# Patient Record
Sex: Female | Born: 1945 | Race: White | Hispanic: No | Smoking: Never smoker
Health system: Southern US, Community
[De-identification: ages and names within clinical notes are randomized; demographics above are authoritative.]

## PROBLEM LIST (undated history)

## (undated) DIAGNOSIS — C55 Malignant neoplasm of uterus, part unspecified: Secondary | ICD-10-CM

## (undated) DIAGNOSIS — C787 Secondary malignant neoplasm of liver and intrahepatic bile duct: Secondary | ICD-10-CM

## (undated) DIAGNOSIS — Z923 Personal history of irradiation: Secondary | ICD-10-CM

## (undated) DIAGNOSIS — E039 Hypothyroidism, unspecified: Secondary | ICD-10-CM

## (undated) DIAGNOSIS — I10 Essential (primary) hypertension: Secondary | ICD-10-CM

## (undated) DIAGNOSIS — Z8489 Family history of other specified conditions: Secondary | ICD-10-CM

## (undated) DIAGNOSIS — S2249XA Multiple fractures of ribs, unspecified side, initial encounter for closed fracture: Secondary | ICD-10-CM

## (undated) DIAGNOSIS — S2239XA Fracture of one rib, unspecified side, initial encounter for closed fracture: Secondary | ICD-10-CM

## (undated) HISTORY — DX: Personal history of irradiation: Z92.3

## (undated) HISTORY — PX: TUBAL LIGATION: SHX77

## (undated) HISTORY — DX: Essential (primary) hypertension: I10

## (undated) HISTORY — PX: OTHER SURGICAL HISTORY: SHX169

## (undated) HISTORY — DX: Malignant neoplasm of uterus, part unspecified: C55

---

## 1998-09-21 ENCOUNTER — Other Ambulatory Visit: Admission: RE | Admit: 1998-09-21 | Discharge: 1998-09-21 | Payer: Self-pay | Admitting: *Deleted

## 1999-11-22 ENCOUNTER — Encounter: Admission: RE | Admit: 1999-11-22 | Discharge: 1999-11-22 | Payer: Self-pay | Admitting: *Deleted

## 2000-02-28 ENCOUNTER — Other Ambulatory Visit: Admission: RE | Admit: 2000-02-28 | Discharge: 2000-02-28 | Payer: Self-pay | Admitting: *Deleted

## 2000-11-25 ENCOUNTER — Encounter: Admission: RE | Admit: 2000-11-25 | Discharge: 2000-11-25 | Payer: Self-pay | Admitting: *Deleted

## 2000-11-28 ENCOUNTER — Encounter: Admission: RE | Admit: 2000-11-28 | Discharge: 2000-11-28 | Payer: Self-pay | Admitting: *Deleted

## 2001-03-03 ENCOUNTER — Other Ambulatory Visit: Admission: RE | Admit: 2001-03-03 | Discharge: 2001-03-03 | Payer: Self-pay | Admitting: *Deleted

## 2001-12-30 ENCOUNTER — Encounter: Admission: RE | Admit: 2001-12-30 | Discharge: 2001-12-30 | Payer: Self-pay | Admitting: *Deleted

## 2002-03-11 ENCOUNTER — Other Ambulatory Visit: Admission: RE | Admit: 2002-03-11 | Discharge: 2002-03-11 | Payer: Self-pay | Admitting: *Deleted

## 2002-05-14 ENCOUNTER — Encounter (INDEPENDENT_AMBULATORY_CARE_PROVIDER_SITE_OTHER): Payer: Self-pay | Admitting: *Deleted

## 2002-05-14 ENCOUNTER — Ambulatory Visit (HOSPITAL_COMMUNITY): Admission: RE | Admit: 2002-05-14 | Discharge: 2002-05-14 | Payer: Self-pay | Admitting: *Deleted

## 2003-04-05 ENCOUNTER — Encounter: Admission: RE | Admit: 2003-04-05 | Discharge: 2003-04-05 | Payer: Self-pay | Admitting: *Deleted

## 2003-04-05 ENCOUNTER — Other Ambulatory Visit: Admission: RE | Admit: 2003-04-05 | Discharge: 2003-04-05 | Payer: Self-pay | Admitting: *Deleted

## 2003-04-18 ENCOUNTER — Ambulatory Visit (HOSPITAL_COMMUNITY): Admission: RE | Admit: 2003-04-18 | Discharge: 2003-04-18 | Payer: Self-pay | Admitting: *Deleted

## 2004-04-27 ENCOUNTER — Encounter: Admission: RE | Admit: 2004-04-27 | Discharge: 2004-04-27 | Payer: Self-pay | Admitting: *Deleted

## 2004-05-18 ENCOUNTER — Ambulatory Visit (HOSPITAL_COMMUNITY): Admission: RE | Admit: 2004-05-18 | Discharge: 2004-05-18 | Payer: Self-pay | Admitting: *Deleted

## 2005-04-07 HISTORY — PX: ANKLE SURGERY: SHX546

## 2005-07-03 ENCOUNTER — Encounter: Admission: RE | Admit: 2005-07-03 | Discharge: 2005-08-01 | Payer: Self-pay | Admitting: Orthopaedic Surgery

## 2005-08-23 ENCOUNTER — Encounter: Admission: RE | Admit: 2005-08-23 | Discharge: 2005-08-23 | Payer: Self-pay | Admitting: *Deleted

## 2006-10-01 ENCOUNTER — Encounter: Admission: RE | Admit: 2006-10-01 | Discharge: 2006-10-01 | Payer: Self-pay | Admitting: Obstetrics and Gynecology

## 2007-10-28 ENCOUNTER — Encounter: Admission: RE | Admit: 2007-10-28 | Discharge: 2007-10-28 | Payer: Self-pay | Admitting: Obstetrics and Gynecology

## 2009-01-03 ENCOUNTER — Encounter: Admission: RE | Admit: 2009-01-03 | Discharge: 2009-01-03 | Payer: Self-pay | Admitting: Obstetrics and Gynecology

## 2009-08-30 ENCOUNTER — Encounter (INDEPENDENT_AMBULATORY_CARE_PROVIDER_SITE_OTHER): Payer: Self-pay | Admitting: *Deleted

## 2010-04-13 ENCOUNTER — Telehealth: Payer: Self-pay | Admitting: Internal Medicine

## 2010-07-18 DIAGNOSIS — K602 Anal fissure, unspecified: Secondary | ICD-10-CM | POA: Insufficient documentation

## 2010-07-18 DIAGNOSIS — K649 Unspecified hemorrhoids: Secondary | ICD-10-CM | POA: Insufficient documentation

## 2010-07-29 ENCOUNTER — Encounter: Payer: Self-pay | Admitting: Family Medicine

## 2010-08-07 NOTE — Letter (Signed)
Summary: Colonoscopy Letter  [facility]  [address] [place], [place] [address]   Phone: [phone]  Fax: [phone]      August 30, 2009 MRN: [id number]   Michelle Rosario [address] [place], [place]  [address]   Dear [name],   According to your medical record, it is time for you to schedule a Colonoscopy. The American Cancer Society recommends this procedure as a method to detect early colon cancer. Patients with a family history of colon cancer, or a personal history of colon polyps or inflammatory bowel disease are at increased risk.  This letter has beeen generated based on the recommendations made at the time of your procedure. If you feel that in your particular situation this may no longer apply, please contact our office.  Please call our office at [phone] to schedule this appointment or to update your records at your earliest convenience.  Thank you for cooperating with [place] to provide you with the very best care possible.   Sincerely,  Hedwig Morton. Juanda Chance, M.D.  [facility] [phone]

## 2010-08-07 NOTE — Progress Notes (Signed)
Summary: Schedule Colonoscopy  Phone Note Outgoing Call Call back at [facility] Phone [phone]   Call placed by: Harlow Mares CMA Duncan Dull),  April 13, 2010 10:09 AM Call placed to: Patient Summary of Call: Left a message on patients machine to call back. patient due for her colonoscopy Initial call taken by: Harlow Mares CMA Duncan Dull),  April 13, 2010 10:09 AM  Follow-up for Phone Call        Left a message on the patient machine to call back and schedule a previsit and procedure with our office. A letter will be mailed to the patient.  Follow-up by: Harlow Mares CMA Duncan Dull),  April 20, 2010 3:53 PM     Appended Document: Schedule Colonoscopy pt getting GI care with Dr. Madilyn Fireman

## 2010-11-23 NOTE — Op Note (Signed)
NAME:  Michelle Rosario, Michelle Rosario                          ACCOUNT NO.:  [id number]   MEDICAL RECORD NO.:  [id number]                   PATIENT TYPE:  AMB   LOCATION:  DAY                                  FACILITY:  [facility]   PHYSICIAN:  Pershing Cox, M.D.            DATE OF BIRTH:  Sep 30, 1945   DATE OF PROCEDURE:  05/14/2002  DATE OF DISCHARGE:                                 OPERATIVE REPORT   PREOPERATIVE DIAGNOSES:  1. Postmenopausal bleeding.  2. Abnormal sonogram.   POSTOPERATIVE DIAGNOSES:  1. Postmenopausal bleeding.  2. Abnormal sonogram.  3. Irregular posterior uterine wall.   PROCEDURE:  1. Exam under anesthesia.  2. Fractional D&C hysteroscopy.   SURGEON:  Pershing Cox, M.D.   ANESTHESIA:  MAC plus Marcaine paracervical block.   INDICATIONS FOR PROCEDURE:  Michelle Rosario is a 65 year old female, who has  been on hormone replacement therapy with Vivelle and Prometrium daily.  She  started having cyclic bleeding starting in March.  She has had no other  significant problems.  Because of his postmenopausal bleeding, I recommended  that she have a sonogram.  This was performed and showed a thickened  endometrium.  She subsequently underwent a hydrosonogram, and this  hydrosonogram showed a thickened posterior wall.  For this reason, she is  brought to the operating room today for [facility] hysteroscopy.  Prior to this  procedure, she had an endometrial biopsy which showed proliferative  endometrium with breakdown.   FINDINGS:  The endometrial cavity was 3.75 inches in length.  The cavity was  in a slightly retroverted position.  Cavity walls were easily distended.  There were no filling defects.  There was thickened tissue on the posterior  uterine wall, especially along the lower uterine segment.   DESCRIPTION OF PROCEDURE:  Michelle Rosario was brought to the operating room  with an IV in place.  She received 1 g of Ancef in the holding area.  Supine  on the OR  table, IV sedation was administered, and she was placed into Allen  stirrups.  Hibiclens was used to prep the vagina and perineum.  A red rubber  catheter was used to empty the bladder.  Examination showed the uterus to be  in the mid plane to slightly anteverted position.  The patient was then  draped for a sterile vaginal procedure.  Collecting drape was placed beneath  the buttocks to collect her effluent so it could be adequately measured.   Bivalve speculum was inserted into the vagina.  Marcaine was used to  anesthetize the anterior cervix which was grasped with a single-tooth  tenaculum.  Marcaine 25% was injected into the cervix in the 3, 4, 7, and 8  positions using a total volume of 20 cc of this solution.  Using a Kevorkian  curette, endocervical curettings were obtained, collected onto a Telfa.  The  sound then passed to a  depth of 3.75 inches in a slightly retroverted  position.  Cervix was then dilated using serial Pratt dilators to size 25.  Diagnostic scope was introduced and using through-and-through sorbitol  irrigation, I was able to visualize the cavity.  Photographs were taken.  The scope was then removed.  Small sharp curette was used to collect  endometrial curettings onto a waiting Telfa.  The serrated or Meigs curette  was used to finally curette the endometrial cavity.  All of these curettings  were submitted as endometrial curettings.  Sound was re-placed and passed to  a depth of 3.75 inches.  The patient was cleansed of the Hibiclens and then  taken to the recovery room in good condition.                                               Pershing Cox, M.D.    MAJ/MEDQ  D:  05/14/2002  T:  05/14/2002  Job:  [id number]   cc:   Dr. Andrey Campanile

## 2011-02-25 ENCOUNTER — Other Ambulatory Visit: Payer: Self-pay | Admitting: Obstetrics

## 2011-02-25 DIAGNOSIS — Z1231 Encounter for screening mammogram for malignant neoplasm of breast: Secondary | ICD-10-CM

## 2011-03-21 ENCOUNTER — Ambulatory Visit
Admission: RE | Admit: 2011-03-21 | Discharge: 2011-03-21 | Disposition: A | Discharge status: Home / Self Care | Payer: Federal, State, Local not specified - PPO | Source: Ambulatory Visit | Attending: Obstetrics | Admitting: Obstetrics

## 2011-03-21 DIAGNOSIS — Z1231 Encounter for screening mammogram for malignant neoplasm of breast: Secondary | ICD-10-CM

## 2011-10-07 ENCOUNTER — Encounter: Payer: Self-pay | Admitting: Internal Medicine

## 2011-10-23 ENCOUNTER — Ambulatory Visit: Payer: Federal, State, Local not specified - PPO | Admitting: Physical Therapy

## 2012-02-25 ENCOUNTER — Ambulatory Visit: Payer: Federal, State, Local not specified - PPO | Attending: Orthopedic Surgery | Admitting: Physical Therapy

## 2012-02-25 DIAGNOSIS — M545 Low back pain, unspecified: Secondary | ICD-10-CM | POA: Insufficient documentation

## 2012-02-25 DIAGNOSIS — R5381 Other malaise: Secondary | ICD-10-CM | POA: Insufficient documentation

## 2012-02-25 DIAGNOSIS — IMO0001 Reserved for inherently not codable concepts without codable children: Secondary | ICD-10-CM | POA: Insufficient documentation

## 2012-02-26 ENCOUNTER — Ambulatory Visit: Payer: Federal, State, Local not specified - PPO | Admitting: Physical Therapy

## 2012-02-28 ENCOUNTER — Ambulatory Visit: Payer: Federal, State, Local not specified - PPO | Admitting: *Deleted

## 2012-03-02 ENCOUNTER — Ambulatory Visit: Payer: Federal, State, Local not specified - PPO | Admitting: Physical Therapy

## 2012-03-05 ENCOUNTER — Ambulatory Visit: Payer: Federal, State, Local not specified - PPO | Admitting: *Deleted

## 2012-03-11 ENCOUNTER — Ambulatory Visit: Payer: Federal, State, Local not specified - PPO | Attending: Orthopedic Surgery | Admitting: Physical Therapy

## 2012-03-11 DIAGNOSIS — R5381 Other malaise: Secondary | ICD-10-CM | POA: Insufficient documentation

## 2012-03-11 DIAGNOSIS — IMO0001 Reserved for inherently not codable concepts without codable children: Secondary | ICD-10-CM | POA: Insufficient documentation

## 2012-03-11 DIAGNOSIS — M545 Low back pain, unspecified: Secondary | ICD-10-CM | POA: Insufficient documentation

## 2012-03-13 ENCOUNTER — Ambulatory Visit: Payer: Federal, State, Local not specified - PPO | Admitting: Physical Therapy

## 2012-03-16 ENCOUNTER — Ambulatory Visit: Payer: Federal, State, Local not specified - PPO | Admitting: Physical Therapy

## 2012-05-05 ENCOUNTER — Other Ambulatory Visit: Payer: Self-pay | Admitting: Obstetrics

## 2012-05-05 DIAGNOSIS — Z1231 Encounter for screening mammogram for malignant neoplasm of breast: Secondary | ICD-10-CM

## 2012-06-19 ENCOUNTER — Ambulatory Visit
Admission: RE | Admit: 2012-06-19 | Discharge: 2012-06-19 | Disposition: A | Discharge status: Home / Self Care | Payer: Federal, State, Local not specified - PPO | Source: Ambulatory Visit | Attending: Obstetrics | Admitting: Obstetrics

## 2012-06-19 DIAGNOSIS — Z1231 Encounter for screening mammogram for malignant neoplasm of breast: Secondary | ICD-10-CM

## 2012-06-24 ENCOUNTER — Other Ambulatory Visit: Payer: Self-pay | Admitting: Obstetrics

## 2012-06-24 DIAGNOSIS — R928 Other abnormal and inconclusive findings on diagnostic imaging of breast: Secondary | ICD-10-CM

## 2012-07-06 ENCOUNTER — Ambulatory Visit
Admission: RE | Admit: 2012-07-06 | Discharge: 2012-07-06 | Disposition: A | Discharge status: Home / Self Care | Payer: Federal, State, Local not specified - PPO | Source: Ambulatory Visit | Attending: Obstetrics | Admitting: Obstetrics

## 2012-07-06 DIAGNOSIS — R928 Other abnormal and inconclusive findings on diagnostic imaging of breast: Secondary | ICD-10-CM

## 2012-07-08 HISTORY — PX: OTHER SURGICAL HISTORY: SHX169

## 2012-08-17 ENCOUNTER — Ambulatory Visit: Payer: Federal, State, Local not specified - PPO | Attending: Orthopedic Surgery | Admitting: Physical Therapy

## 2012-08-17 DIAGNOSIS — IMO0001 Reserved for inherently not codable concepts without codable children: Secondary | ICD-10-CM | POA: Insufficient documentation

## 2012-08-17 DIAGNOSIS — R5381 Other malaise: Secondary | ICD-10-CM | POA: Insufficient documentation

## 2012-08-17 DIAGNOSIS — M545 Low back pain, unspecified: Secondary | ICD-10-CM | POA: Insufficient documentation

## 2012-08-20 ENCOUNTER — Encounter: Payer: Federal, State, Local not specified - PPO | Admitting: Physical Therapy

## 2012-08-26 ENCOUNTER — Encounter: Payer: Federal, State, Local not specified - PPO | Admitting: Physical Therapy

## 2012-09-02 ENCOUNTER — Ambulatory Visit: Payer: Federal, State, Local not specified - PPO | Admitting: Physical Therapy

## 2012-09-08 ENCOUNTER — Ambulatory Visit: Payer: Federal, State, Local not specified - PPO | Attending: Orthopedic Surgery | Admitting: Physical Therapy

## 2012-09-08 DIAGNOSIS — IMO0001 Reserved for inherently not codable concepts without codable children: Secondary | ICD-10-CM | POA: Insufficient documentation

## 2012-09-08 DIAGNOSIS — M545 Low back pain, unspecified: Secondary | ICD-10-CM | POA: Insufficient documentation

## 2012-09-08 DIAGNOSIS — R5381 Other malaise: Secondary | ICD-10-CM | POA: Insufficient documentation

## 2012-09-11 ENCOUNTER — Encounter: Payer: Federal, State, Local not specified - PPO | Admitting: *Deleted

## 2012-10-14 ENCOUNTER — Ambulatory Visit: Payer: Federal, State, Local not specified - PPO | Attending: Orthopedic Surgery | Admitting: Physical Therapy

## 2012-10-14 DIAGNOSIS — R5381 Other malaise: Secondary | ICD-10-CM | POA: Insufficient documentation

## 2012-10-14 DIAGNOSIS — M545 Low back pain, unspecified: Secondary | ICD-10-CM | POA: Insufficient documentation

## 2012-10-14 DIAGNOSIS — IMO0001 Reserved for inherently not codable concepts without codable children: Secondary | ICD-10-CM | POA: Insufficient documentation

## 2012-10-16 ENCOUNTER — Ambulatory Visit: Payer: Federal, State, Local not specified - PPO | Admitting: Physical Therapy

## 2013-07-08 HISTORY — PX: REPLACEMENT TOTAL KNEE: SUR1224

## 2013-07-09 ENCOUNTER — Other Ambulatory Visit: Payer: Self-pay

## 2013-07-09 DIAGNOSIS — Z1231 Encounter for screening mammogram for malignant neoplasm of breast: Secondary | ICD-10-CM

## 2013-07-29 ENCOUNTER — Ambulatory Visit
Admission: RE | Admit: 2013-07-29 | Discharge: 2013-07-29 | Disposition: A | Discharge status: Home / Self Care | Payer: Federal, State, Local not specified - PPO | Source: Ambulatory Visit

## 2013-07-29 DIAGNOSIS — Z1231 Encounter for screening mammogram for malignant neoplasm of breast: Secondary | ICD-10-CM

## 2013-08-09 DIAGNOSIS — M1712 Unilateral primary osteoarthritis, left knee: Secondary | ICD-10-CM | POA: Insufficient documentation

## 2014-02-03 DIAGNOSIS — L290 Pruritus ani: Secondary | ICD-10-CM | POA: Insufficient documentation

## 2014-11-01 ENCOUNTER — Other Ambulatory Visit: Payer: Self-pay | Admitting: Obstetrics

## 2014-11-01 DIAGNOSIS — E2839 Other primary ovarian failure: Secondary | ICD-10-CM

## 2014-11-10 ENCOUNTER — Ambulatory Visit
Admission: RE | Admit: 2014-11-10 | Discharge: 2014-11-10 | Disposition: A | Discharge status: Home / Self Care | Payer: Medicare Other | Source: Ambulatory Visit | Attending: Obstetrics | Admitting: Obstetrics

## 2014-11-10 DIAGNOSIS — E2839 Other primary ovarian failure: Secondary | ICD-10-CM

## 2015-02-14 ENCOUNTER — Other Ambulatory Visit: Payer: Self-pay | Admitting: Gastroenterology

## 2015-12-18 ENCOUNTER — Ambulatory Visit: Payer: Medicare Other | Admitting: Gynecologic Oncology

## 2015-12-20 ENCOUNTER — Ambulatory Visit: Payer: Medicare Other | Attending: Gynecologic Oncology | Admitting: Gynecologic Oncology

## 2015-12-20 ENCOUNTER — Encounter: Payer: Self-pay | Admitting: Gynecologic Oncology

## 2015-12-20 VITALS — BP 130/83 | HR 88 | Temp 98.6°F | Resp 18 | Ht 64.5 in | Wt 227.1 lb

## 2015-12-20 DIAGNOSIS — I10 Essential (primary) hypertension: Secondary | ICD-10-CM | POA: Insufficient documentation

## 2015-12-20 DIAGNOSIS — C541 Malignant neoplasm of endometrium: Secondary | ICD-10-CM | POA: Diagnosis present

## 2015-12-20 NOTE — Patient Instructions (Signed)
Preparing for your Surgery  Plan for surgery on January 02, 2016 with Dr. Everitt Amber.  You will be scheduled for a robotic assisted total hysterectomy, bilateral salpingo-oophorectomy, sentinel lymph node biopsy.  Pre-operative Testing -You will receive a phone call from presurgical testing at [facility] to arrange for a pre-operative testing appointment before your surgery.  This appointment normally occurs one to two weeks before your scheduled surgery.   -Bring your insurance card, copy of an advanced directive if applicable, medication list  -At that visit, you will be asked to sign a consent for a possible blood transfusion in case a transfusion becomes necessary during surgery.  The need for a blood transfusion is rare but having consent is a necessary part of your care.     -You should not be taking blood thinners or aspirin at least ten days prior to surgery unless instructed by your surgeon.  Day Before Surgery at [place] will be asked to take in a light diet the day before surgery.  Avoid carbonated beverages.  You will be advised to have nothing to eat or drink after midnight the evening before.     Eat a light diet the day before surgery.  Examples including soups, broths, toast, yogurt, mashed potatoes.  Things to avoid include carbonated beverages (fizzy beverages), raw fruits and raw vegetables, or beans.    If your bowels are filled with gas, your surgeon will have difficulty visualizing your pelvic organs which increases your surgical risks.  Your role in recovery Your role is to become active as soon as directed by your doctor, while still giving yourself time to heal.  Rest when you feel tired. You will be asked to do the following in order to speed your recovery:  - Cough and breathe deeply. This helps toclear and expand your lungs and can prevent pneumonia. You may be given a spirometer to practice deep breathing. A staff member will show you how to  use the spirometer. - Do mild physical activity. Walking or moving your legs help your circulation and body functions return to normal. A staff member will help you when you try to walk and will provide you with simple exercises. Do not try to get up or walk alone the first time. - Actively manage your pain. Managing your pain lets you move in comfort. We will ask you to rate your pain on a scale of zero to 10. It is your responsibility to tell your doctor or nurse where and how much you hurt so your pain can be treated.  Special Considerations -If you are diabetic, you may be placed on insulin after surgery to have closer control over your blood sugars to promote healing and recovery.  This does not mean that you will be discharged on insulin.  If applicable, your oral antidiabetics will be resumed when you are tolerating a solid diet.  -Your final pathology results from surgery should be available by the Friday after surgery and the results will be relayed to you when available.  Blood Transfusion Information WHAT IS A BLOOD TRANSFUSION? A transfusion is the replacement of blood or some of its parts. Blood is made up of multiple cells which provide different functions.  Red blood cells carry oxygen and are used for blood loss replacement.  White blood cells fight against infection.  Platelets control bleeding.  Plasma helps clot blood.  Other blood products are available for specialized needs, such as hemophilia or other clotting disorders. BEFORE  THE TRANSFUSION  Who gives blood for transfusions?   You may be able to donate blood to be used at a later date on yourself (autologous donation).  Relatives can be asked to donate blood. This is generally not any safer than if you have received blood from a stranger. The same precautions are taken to ensure safety when a relative's blood is donated.  Healthy volunteers who are fully evaluated to make sure their blood is safe. This is blood  bank blood. Transfusion therapy is the safest it has ever been in the practice of medicine. Before blood is taken from a donor, a complete history is taken to make sure that person has no history of diseases nor engages in risky social behavior (examples are intravenous drug use or sexual activity with multiple partners). The donor's travel history is screened to minimize risk of transmitting infections, such as malaria. The donated blood is tested for signs of infectious diseases, such as HIV and hepatitis. The blood is then tested to be sure it is compatible with you in order to minimize the chance of a transfusion reaction. If you or a relative donates blood, this is often done in anticipation of surgery and is not appropriate for emergency situations. It takes many days to process the donated blood. RISKS AND COMPLICATIONS Although transfusion therapy is very safe and saves many lives, the main dangers of transfusion include:   Getting an infectious disease.  Developing a transfusion reaction. This is an allergic reaction to something in the blood you were given. Every precaution is taken to prevent this. The decision to have a blood transfusion has been considered carefully by your caregiver before blood is given. Blood is not given unless the benefits outweigh the risks.

## 2015-12-20 NOTE — Progress Notes (Signed)
Consult Note: Gyn-Onc  Consult was requested by Dr. Pamala Hurry for the evaluation of Michelle Rosario 70 y.o. female  CC:  Chief Complaint  Patient presents with  . Endometrial Cancer    New Consultation    Assessment/Plan:  Michelle Rosario  is a 70 y.o.  year old with grade 2 endometrial cancer.   A detailed discussion was held with the patient and her family with regard to to her endometrial cancer diagnosis. We discussed the standard management options for uterine cancer which includes surgery followed possibly by adjuvant therapy depending on the results of surgery. The options for surgical management include a hysterectomy and removal of the tubes and ovaries possibly with removal of pelvic and para-aortic lymph nodes. A minimally invasive approach including a robotic hysterectomy or laparoscopic hysterectomy have benefits including shorter hospital stay, recovery time and better wound healing. The alternative approach is an open hysterectomy. The patient has been counseled about these surgical options and the risks of surgery in general including infection, bleeding, damage to surrounding structures (including bowel, bladder, ureters, nerves or vessels), and the postoperative risks of PE/ DVT, and lymphedema. I extensively reviewed the additional risks of robotic hysterectomy including possible need for conversion to open laparotomy.  I discussed positioning during surgery of trendelenberg and risks of minor facial swelling and care we take in preoperative positioning.  After counseling and consideration of her options, she desires to proceed with robotic assisted total hysterectomy, BSO, SLN biopsy.   She will be seen by anesthesia for preoperative clearance and discussion of postoperative pain management.  She was given the opportunity to ask questions, which were answered to her satisfaction, and she is agreement with the above mentioned plan of care.    HPI: The patient is a 70 year  old G1P1 who is seen in consultation at the request of Dr Pamala Hurry for grade 2 endometrial cancer. The patient reports a 1 month history of postmenopausal bleeding. A transvaginal ultrasound scan was performed on 09/11/2015. It revealed a uterus measuring 7.2 x 6 x 4.5 cm with a thickened endometrial stripe of 29 mm. The ovaries were normal bilaterally. 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  The patient is otherwise healthy. She is obese with a BMI of 38 kg meters squared. She has had no prior abdominal surgeries other than a tubal ligation. She has a past medical history significant for hypertension and hypothyroidism. She has had prior sampling for endometrium approximately 10 years ago which revealed simple endometrial hyperplasia. Her father has a history of bladder cancer that he was a smoker in his youth. Her mother had a diagnosis of early stage uterine cancer at advanced age.  Current Meds:  Outpatient Encounter Prescriptions as of 12/20/2015  Medication Sig  . B Complex Vitamins (VITAMIN B COMPLEX) TABS Take by mouth.  . Calcium Carbonate-Vitamin D (CALTRATE COLON HEALTH PO) Take 1 tablet by mouth daily.  Marland Kitchen gabapentin (NEURONTIN) 600 MG tablet Take by mouth.  . levothyroxine (SYNTHROID, LEVOTHROID) 75 MCG tablet   . losartan-hydrochlorothiazide (HYZAAR) 100-12.5 MG tablet Take 1 tablet by mouth daily.  . magnesium 30 MG tablet Take by mouth.  . megestrol (MEGACE) 40 MG tablet Take 80 mg by mouth daily.  . Melatonin 3 MG TBDP Take by mouth.  Marland Kitchen PARoxetine Mesylate (BRISDELLE) 7.5 MG CAPS   . tinidazole (TINDAMAX) 500 MG tablet Take 1,000 mg by mouth daily.  . traZODone (DESYREL) 50  MG tablet Take by mouth.  . [DISCONTINUED] lisinopril-hydrochlorothiazide (PRINZIDE,ZESTORETIC) 10-12.5 MG tablet Take by mouth.   No facility-administered encounter medications on file as of 12/20/2015.    Allergy: Not on  File  Social Hx:   Social History   Social History  . Marital Status: Divorced    Spouse Name: N/A  . Number of Children: N/A  . Years of Education: N/A   Occupational History  . Not on file.   Social History Main Topics  . Smoking status: Never Smoker   . Smokeless tobacco: Not on file  . Alcohol Use: Yes  . Drug Use: No  . Sexual Activity: Not on file   Other Topics Concern  . Not on file   Social History Narrative  . No narrative on file    Past Surgical Hx:  Past Surgical History  Procedure Laterality Date  . Replacement total knee Left 2015  . Fisure  2014  . Ankle surgery Right Oct 2006    Past Medical Hx:  Past Medical History  Diagnosis Date  . Uterine cancer [facility])     May 30 , 2017  . Hypertension     Past Gynecological History:  SVD x 1  No LMP recorded. Patient is postmenopausal.  Family Hx:  Family History  Problem Relation Age of Onset  . Anesthesia problems Mother   . Cancer Mother     uterine cancer  . Cancer Father   . Diabetes Sister     Review of Systems:  Constitutional  Feels well,    ENT Normal appearing ears and nares bilaterally Skin/Breast  No rash, sores, jaundice, itching, dryness Cardiovascular  No chest pain, shortness of breath, or edema  Pulmonary  No cough or wheeze.  Gastro Intestinal  No nausea, vomitting, or diarrhoea. No bright red blood per rectum, no abdominal pain, change in bowel movement, or constipation.  Genito Urinary  No frequency, urgency, dysuria, + PMP bleeding Musculo Skeletal  No myalgia, arthralgia, joint swelling or pain  Neurologic  No weakness, numbness, change in gait,  Psychology  No depression, anxiety, insomnia.   Vitals:  Blood pressure 130/83, pulse 88, temperature 98.6 F (37 C), temperature source Oral, resp. rate 18, height 5' 4.5" (1.638 m), weight 227 lb 1.6 oz (103.012 kg), SpO2 100 %.  Physical Exam: WD in NAD Neck  Supple NROM, without any enlargements.  Lymph Node  Survey No cervical supraclavicular or inguinal adenopathy Cardiovascular  Pulse normal rate, regularity and rhythm. S1 and S2 normal.  Lungs  Clear to auscultation bilateraly, without wheezes/crackles/rhonchi. Good air movement.  Skin  No rash/lesions/breakdown  Psychiatry  Alert and oriented to person, place, and time  Abdomen  Normoactive bowel sounds, abdomen soft, non-tender and obese without evidence of hernia.  Back No CVA tenderness Genito Urinary  Vulva/vagina: Normal external female genitalia.   No lesions. No discharge or bleeding.  Bladder/urethra:  No lesions or masses, well supported bladder  Vagina: normal  Cervix: Normal appearing, no lesions.  Uterus:  Small, mobile, no parametrial involvement or nodularity.  Adnexa: no palpable masses. Rectal  Good tone, no masses no cul de sac nodularity.  Extremities  No bilateral cyanosis, clubbing or edema.   Donaciano Eva, MD  12/20/2015, 6:19 PM

## 2015-12-28 ENCOUNTER — Telehealth: Payer: Self-pay

## 2015-12-28 NOTE — Telephone Encounter (Signed)
Patient called with c/o "insomnia" , patient states that she can not sleep and has tried , Benadryl , Melatonin as well as Tylenol PM and none of them have been affective. Melissa Cross, APNP updated , recommendations are to try "Unisom" sleep aid . Patient updated with recommendations , patient states she will try the Uni some tonight . Patient was also instructed to limit alcohol consumption during the evening hours. Patient states she does not drink any alcoholic beverages ,denies further questuions at this time.

## 2015-12-29 NOTE — Patient Instructions (Addendum)
Michelle Rosario  12/29/2015   Your procedure is scheduled on: January 02, 2016  [facility] to [facility]  Entrance take [facility]  elevators to 3rd floor to  [facility] at  10:55 AM.  Call this number if you have problems the morning of surgery [phone]   Remember: ONLY 1 PERSON MAY GO WITH YOU TO SHORT STAY TO GET  READY MORNING OF [facility].  Do not eat food or drink liquids :After Midnight.              [place] a light diet the day before surgery.  Examples: soups, broths, toast, yogert, mashed potatoes.  Avoid carbonated beverages, raw fruits & vegetables and beans     Take these medicines the morning of surgery with A SIP OF WATER: Levothyroxine, Megestrol (Megace), Paroxetine Mesylate (Brisdelle),  DO NOT TAKE ANY DIABETIC MEDICATIONS DAY OF YOUR SURGERY                               You may not have any metal on your body including hair pins and              piercings  Do not wear jewelry, make-up, lotions, powders or perfumes, deodorant             Do not wear nail polish.  Do not shave  48 hours prior to surgery.              Do not bring valuables to the hospital. [facility].  Contacts, dentures or bridgework may not be worn into surgery.      Patients discharged the day of surgery will not be allowed to drive home.  Name and phone number of your driver:  Special Instructions: coughing and deep breathing exercises, leg exercises              Please read over the following fact sheets you were given: _____________________________________________________________________             [facility] - Preparing for Surgery Before surgery, you can play an important role.  Because skin is not sterile, your skin needs to be as free of germs as possible.  You can reduce the number of germs on your skin by washing with CHG (chlorahexidine gluconate) soap before surgery.  CHG is an antiseptic cleaner which  kills germs and bonds with the skin to continue killing germs even after washing. Please DO NOT use if you have an allergy to CHG or antibacterial soaps.  If your skin becomes reddened/irritated stop using the CHG and inform your nurse when you arrive at Short Stay. Do not shave (including legs and underarms) for at least 48 hours prior to the first CHG shower.  You may shave your face/neck. Please follow these instructions carefully:  1.  Shower with CHG Soap the night before surgery and the  morning of Surgery.  2.  If you choose to wash your hair, wash your hair first as usual with your  normal  shampoo.  3.  After you shampoo, rinse your hair and body thoroughly to remove the  shampoo.  4.  Use CHG as you would any other liquid soap.  You can apply chg directly  to the skin and wash                       Gently with a scrungie or clean washcloth.  5.  Apply the CHG Soap to your body ONLY FROM THE NECK DOWN.   Do not use on face/ open                           Wound or open sores. Avoid contact with eyes, ears mouth and genitals (private parts).                       Wash face,  Genitals (private parts) with your normal soap.             6.  Wash thoroughly, paying special attention to the area where your surgery  will be performed.  7.  Thoroughly rinse your body with warm water from the neck down.  8.  DO NOT shower/wash with your normal soap after using and rinsing off  the CHG Soap.                9.  Pat yourself dry with a clean towel.            10.  Wear clean pajamas.            11.  Place clean sheets on your bed the night of your first shower and do not  sleep with pets. Day of Surgery : Do not apply any lotions/deodorants the morning of surgery.  Please wear clean clothes to the hospital/surgery [facility].  FAILURE TO FOLLOW THESE INSTRUCTIONS MAY RESULT IN THE CANCELLATION OF YOUR SURGERY PATIENT SIGNATURE_________________________________  NURSE  SIGNATURE__________________________________  ________________________________________________________________________   [facility]  An incentive spirometer is a tool that can help keep your lungs clear and active. This tool measures how well you are filling your lungs with each breath. Taking long deep breaths may help reverse or decrease the chance of developing breathing (pulmonary) problems (especially infection) following:  A long period of time when you are unable to move or be active. BEFORE THE PROCEDURE   If the spirometer includes an indicator to show your best effort, your nurse or respiratory therapist will set it to a desired goal.  If possible, sit up straight or lean slightly forward. Try not to slouch.  Hold the incentive spirometer in an upright position. INSTRUCTIONS FOR USE   Sit on the edge of your bed if possible, or sit up as far as you can in bed or on a chair.  Hold the incentive spirometer in an upright position.  Breathe out normally.  Place the mouthpiece in your mouth and seal your lips tightly around it.  Breathe in slowly and as deeply as possible, raising the piston or the ball toward the top of the column.  Hold your breath for 3-5 seconds or for as long as possible. Allow the piston or ball to fall to the bottom of the column.  Remove the mouthpiece from your mouth and breathe out normally.  Rest for a few seconds and repeat Steps 1 through 7 at least 10 times every 1-2 hours when you are awake. Take your time and take a few normal breaths between deep breaths.  The spirometer may include an indicator to show  your best effort. Use the indicator as a goal to work toward during each repetition.  After each set of 10 deep breaths, practice coughing to be sure your lungs are clear. If you have an incision (the cut made at the time of surgery), support your incision when coughing by placing a pillow or rolled up towels firmly against it. Once  you are able to get out of bed, walk around indoors and cough well. You may stop using the incentive spirometer when instructed by your caregiver.  RISKS AND COMPLICATIONS  Take your time so you do not get dizzy or light-headed.  If you are in pain, you may need to take or ask for pain medication before doing incentive spirometry. It is harder to take a deep breath if you are having pain. AFTER USE  Rest and breathe slowly and easily.  It can be helpful to keep track of a log of your progress. Your caregiver can provide you with a simple table to help with this. If you are using the spirometer at home, follow these instructions: [place] IF:   You are having difficultly using the spirometer.  You have trouble using the spirometer as often as instructed.  Your pain medication is not giving enough relief while using the spirometer.  You develop fever of 100.5 F (38.1 C) or higher. SEEK IMMEDIATE MEDICAL CARE IF:   You cough up bloody sputum that had not been present before.  You develop fever of 102 F (38.9 C) or greater.  You develop worsening pain at or near the incision site. MAKE SURE YOU:   Understand these instructions.  Will watch your condition.  Will get help right away if you are not doing well or get worse. Document Released: 11/04/2006 Document Revised: 09/16/2011 Document Reviewed: 01/05/2007 ExitCare Patient Information 2014 ExitCare, [place].   ________________________________________________________________________  WHAT IS A BLOOD TRANSFUSION? Blood Transfusion Information  A transfusion is the replacement of blood or some of its parts. Blood is made up of multiple cells which provide different functions.  Red blood cells carry oxygen and are used for blood loss replacement.  White blood cells fight against infection.  Platelets control bleeding.  Plasma helps clot blood.  Other blood products are available for specialized needs, such as  hemophilia or other clotting disorders. BEFORE THE TRANSFUSION  Who gives blood for transfusions?   Healthy volunteers who are fully evaluated to make sure their blood is safe. This is blood bank blood. Transfusion therapy is the safest it has ever been in the practice of medicine. Before blood is taken from a donor, a complete history is taken to make sure that person has no history of diseases nor engages in risky social behavior (examples are intravenous drug use or sexual activity with multiple partners). The donor's travel history is screened to minimize risk of transmitting infections, such as malaria. The donated blood is tested for signs of infectious diseases, such as HIV and hepatitis. The blood is then tested to be sure it is compatible with you in order to minimize the chance of a transfusion reaction. If you or a relative donates blood, this is often done in anticipation of surgery and is not appropriate for emergency situations. It takes many days to process the donated blood. RISKS AND COMPLICATIONS Although transfusion therapy is very safe and saves many lives, the main dangers of transfusion include:   Getting an infectious disease.  Developing a transfusion reaction. This is an allergic reaction to  something in the blood you were given. Every precaution is taken to prevent this. The decision to have a blood transfusion has been considered carefully by your caregiver before blood is given. Blood is not given unless the benefits outweigh the risks. AFTER THE TRANSFUSION  Right after receiving a blood transfusion, you will usually feel much better and more energetic. This is especially true if your red blood cells have gotten low (anemic). The transfusion raises the level of the red blood cells which carry oxygen, and this usually causes an energy increase.  The nurse administering the transfusion will monitor you carefully for complications. HOME CARE INSTRUCTIONS  No special  instructions are needed after a transfusion. You may find your energy is better. Speak with your caregiver about any limitations on activity for underlying diseases you may have. SEEK MEDICAL CARE IF:   Your condition is not improving after your transfusion.  You develop redness or irritation at the intravenous (IV) site. SEEK IMMEDIATE MEDICAL CARE IF:  Any of the following symptoms occur over the next 12 hours:  Shaking chills.  You have a temperature by mouth above 102 F (38.9 C), not controlled by medicine.  Chest, back, or muscle pain.  People around you feel you are not acting correctly or are confused.  Shortness of breath or difficulty breathing.  Dizziness and fainting.  You get a rash or develop hives.  You have a decrease in urine output.  Your urine turns a dark color or changes to pink, red, or brown. Any of the following symptoms occur over the next 10 days:  You have a temperature by mouth above 102 F (38.9 C), not controlled by medicine.  Shortness of breath.  Weakness after normal activity.  The white part of the eye turns yellow (jaundice).  You have a decrease in the amount of urine or are urinating less often.  Your urine turns a dark color or changes to pink, red, or brown. Document Released: 06/21/2000 Document Revised: 09/16/2011 Document Reviewed: 02/08/2008 [facility] Patient Information 2014 [place], [place].  _______________________________________________________________________

## 2016-01-01 ENCOUNTER — Telehealth: Payer: Self-pay

## 2016-01-01 ENCOUNTER — Ambulatory Visit (HOSPITAL_COMMUNITY)
Admission: RE | Admit: 2016-01-01 | Discharge: 2016-01-01 | Disposition: A | Discharge status: Home / Self Care | Payer: Medicare Other | Source: Ambulatory Visit | Attending: Gynecologic Oncology | Admitting: Gynecologic Oncology

## 2016-01-01 ENCOUNTER — Encounter (HOSPITAL_COMMUNITY)
Admission: RE | Admit: 2016-01-01 | Discharge: 2016-01-01 | Disposition: A | Discharge status: Home / Self Care | Payer: Medicare Other | Source: Ambulatory Visit | Attending: Gynecologic Oncology | Admitting: Gynecologic Oncology

## 2016-01-01 ENCOUNTER — Encounter (HOSPITAL_COMMUNITY): Payer: Self-pay

## 2016-01-01 DIAGNOSIS — C541 Malignant neoplasm of endometrium: Secondary | ICD-10-CM

## 2016-01-01 HISTORY — DX: Multiple fractures of ribs, unspecified side, initial encounter for closed fracture: S22.49XA

## 2016-01-01 HISTORY — DX: Fracture of one rib, unspecified side, initial encounter for closed fracture: S22.39XA

## 2016-01-01 HISTORY — DX: Hypothyroidism, unspecified: E03.9

## 2016-01-01 HISTORY — DX: Family history of other specified conditions: Z84.89

## 2016-01-01 LAB — ABO/RH: ABO/RH(D): A POS

## 2016-01-01 LAB — COMPREHENSIVE METABOLIC PANEL
ALT: 15 U/L (ref 14–54)
AST: 19 U/L (ref 15–41)
Albumin: 4.1 g/dL (ref 3.5–5.0)
Alkaline Phosphatase: 73 U/L (ref 38–126)
Anion gap: 7 (ref 5–15)
BILIRUBIN TOTAL: 0.7 mg/dL (ref 0.3–1.2)
BUN: 15 mg/dL (ref 6–20)
CO2: 26 mmol/L (ref 22–32)
Calcium: 9.5 mg/dL (ref 8.9–10.3)
Chloride: 105 mmol/L (ref 101–111)
Creatinine, Ser: 0.7 mg/dL (ref 0.44–1.00)
GFR calc Af Amer: >60 mL/min (ref >60)
Glucose, Bld: 85 mg/dL (ref 65–99)
POTASSIUM: 3.6 mmol/L (ref 3.5–5.1)
Sodium: 138 mmol/L (ref 135–145)
TOTAL PROTEIN: 7.7 g/dL (ref 6.5–8.1)

## 2016-01-01 LAB — CBC WITH DIFFERENTIAL/PLATELET
BASOS ABS: 0.1 10*3/uL (ref 0.0–0.1)
Basophils Relative: 1 %
Eosinophils Absolute: 0.2 10*3/uL (ref 0.0–0.7)
Eosinophils Relative: 2 %
HEMATOCRIT: 38.5 % (ref 36.0–46.0)
Hemoglobin: 13.1 g/dL (ref 12.0–15.0)
LYMPHS ABS: 3.9 10*3/uL (ref 0.7–4.0)
LYMPHS PCT: 33 %
MCH: 27.5 pg (ref 26.0–34.0)
MCHC: 34 g/dL (ref 30.0–36.0)
MCV: 80.9 fL (ref 78.0–100.0)
MONO ABS: 0.9 10*3/uL (ref 0.1–1.0)
Monocytes Relative: 8 %
NEUTROS ABS: 6.7 10*3/uL (ref 1.7–7.7)
Neutrophils Relative %: 56 %
Platelets: 258 10*3/uL (ref 150–400)
RBC: 4.76 MIL/uL (ref 3.87–5.11)
RDW: 12.9 % (ref 11.5–15.5)
WBC: 11.9 10*3/uL — ABNORMAL HIGH (ref 4.0–10.5)

## 2016-01-01 LAB — URINALYSIS, ROUTINE W REFLEX MICROSCOPIC
Bilirubin Urine: NEGATIVE
GLUCOSE, UA: NEGATIVE mg/dL
Ketones, ur: NEGATIVE mg/dL
NITRITE: NEGATIVE
PROTEIN: 30 mg/dL — AB
SPECIFIC GRAVITY, URINE: 1.012 (ref 1.005–1.030)
pH: 7 (ref 5.0–8.0)

## 2016-01-01 LAB — URINE MICROSCOPIC-ADD ON

## 2016-01-01 NOTE — Telephone Encounter (Signed)
Patient call back , patient states she had received the information she was inquiring about , patient denies further questions at this time.

## 2016-01-01 NOTE — Progress Notes (Signed)
01-01-16 - CBC w/diff, UA/Microscopic lab results from preop visit on 01-01-16 faxed to Dr. Denman George via [facility]

## 2016-01-01 NOTE — Progress Notes (Signed)
12-20-15 - LOV - Dr. Denman George (onc.gyn) - EPIC

## 2016-01-02 ENCOUNTER — Ambulatory Visit (HOSPITAL_COMMUNITY): Payer: Medicare Other | Admitting: Registered Nurse

## 2016-01-02 ENCOUNTER — Encounter (HOSPITAL_COMMUNITY): Payer: Self-pay | Admitting: Gynecologic Oncology

## 2016-01-02 ENCOUNTER — Ambulatory Visit (HOSPITAL_COMMUNITY)
Admission: RE | Admit: 2016-01-02 | Discharge: 2016-01-03 | Disposition: A | Discharge status: Home / Self Care | Payer: Medicare Other | Source: Ambulatory Visit | Attending: Gynecologic Oncology | Admitting: Gynecologic Oncology

## 2016-01-02 ENCOUNTER — Encounter (HOSPITAL_COMMUNITY)
Admission: RE | Disposition: A | Discharge status: Home / Self Care | Payer: Self-pay | Source: Ambulatory Visit | Attending: Gynecologic Oncology

## 2016-01-02 DIAGNOSIS — Z8052 Family history of malignant neoplasm of bladder: Secondary | ICD-10-CM | POA: Diagnosis not present

## 2016-01-02 DIAGNOSIS — E039 Hypothyroidism, unspecified: Secondary | ICD-10-CM | POA: Diagnosis not present

## 2016-01-02 DIAGNOSIS — Z8049 Family history of malignant neoplasm of other genital organs: Secondary | ICD-10-CM | POA: Diagnosis not present

## 2016-01-02 DIAGNOSIS — Z6838 Body mass index (BMI) 38.0-38.9, adult: Secondary | ICD-10-CM | POA: Diagnosis not present

## 2016-01-02 DIAGNOSIS — C541 Malignant neoplasm of endometrium: Secondary | ICD-10-CM | POA: Insufficient documentation

## 2016-01-02 DIAGNOSIS — I10 Essential (primary) hypertension: Secondary | ICD-10-CM | POA: Diagnosis not present

## 2016-01-02 HISTORY — PX: LYMPH NODE BIOPSY: SHX201

## 2016-01-02 HISTORY — PX: ROBOTIC ASSISTED TOTAL HYSTERECTOMY WITH BILATERAL SALPINGO OOPHERECTOMY: SHX6086

## 2016-01-02 LAB — TYPE AND SCREEN
ABO/RH(D): A POS
Antibody Screen: NEGATIVE

## 2016-01-02 SURGERY — HYSTERECTOMY, TOTAL, ROBOT-ASSISTED, LAPAROSCOPIC, WITH BILATERAL SALPINGO-OOPHORECTOMY
Anesthesia: General | Site: Abdomen

## 2016-01-02 MED ORDER — PROPOFOL 10 MG/ML IV BOLUS
INTRAVENOUS | Status: AC
  Filled 2016-01-02: qty 20

## 2016-01-02 MED ORDER — PAROXETINE MESYLATE 7.5 MG PO CAPS: 7.5000 mg | ORAL_CAPSULE | Freq: Every day | ORAL | Status: DC

## 2016-01-02 MED ORDER — LOSARTAN POTASSIUM 50 MG PO TABS
100.0000 mg | ORAL_TABLET | Freq: Every day | ORAL | Status: DC
  Administered 2016-01-02 – 2016-01-03 (×2): 100 mg via ORAL
  Filled 2016-01-02 (×2): qty 2

## 2016-01-02 MED ORDER — LEVOTHYROXINE SODIUM 75 MCG PO TABS
75.0000 ug | ORAL_TABLET | Freq: Every day | ORAL | Status: DC
  Administered 2016-01-03: 75 ug via ORAL
  Filled 2016-01-02: qty 1

## 2016-01-02 MED ORDER — CEFAZOLIN SODIUM-DEXTROSE 2-4 GM/100ML-% IV SOLN
2.0000 g | INTRAVENOUS | Status: AC
  Administered 2016-01-02: 2 g via INTRAVENOUS

## 2016-01-02 MED ORDER — SUGAMMADEX SODIUM 200 MG/2ML IV SOLN
INTRAVENOUS | Status: DC | PRN
  Administered 2016-01-02: 200 mg via INTRAVENOUS

## 2016-01-02 MED ORDER — MIDAZOLAM HCL 2 MG/2ML IJ SOLN
INTRAMUSCULAR | Status: AC
  Filled 2016-01-02: qty 2

## 2016-01-02 MED ORDER — WATER FOR INJECTION STERILE IJ SOLN
INTRAMUSCULAR | Status: DC | PRN
  Administered 2016-01-02: 5 mL via VAGINAL

## 2016-01-02 MED ORDER — ROCURONIUM BROMIDE 100 MG/10ML IV SOLN
INTRAVENOUS | Status: AC
  Filled 2016-01-02: qty 1

## 2016-01-02 MED ORDER — ONDANSETRON HCL 4 MG/2ML IJ SOLN
INTRAMUSCULAR | Status: AC
Start: 2016-01-02 — End: 2016-01-02
  Filled 2016-01-02: qty 2

## 2016-01-02 MED ORDER — ENOXAPARIN SODIUM 40 MG/0.4ML ~~LOC~~ SOLN
40.0000 mg | SUBCUTANEOUS | Status: DC
  Administered 2016-01-03: 40 mg via SUBCUTANEOUS
  Filled 2016-01-02: qty 0.4

## 2016-01-02 MED ORDER — TRAZODONE HCL 50 MG PO TABS
50.0000 mg | ORAL_TABLET | Freq: Every day | ORAL | Status: DC
  Administered 2016-01-02: 50 mg via ORAL
  Filled 2016-01-02: qty 1

## 2016-01-02 MED ORDER — HYDROCHLOROTHIAZIDE 12.5 MG PO CAPS
12.5000 mg | ORAL_CAPSULE | Freq: Every day | ORAL | Status: DC
  Administered 2016-01-02 – 2016-01-03 (×2): 12.5 mg via ORAL
  Filled 2016-01-02 (×2): qty 1

## 2016-01-02 MED ORDER — LACTATED RINGERS IR SOLN
Status: DC | PRN
  Administered 2016-01-02: 1000 mL

## 2016-01-02 MED ORDER — FENTANYL CITRATE (PF) 100 MCG/2ML IJ SOLN
25.0000 ug | INTRAMUSCULAR | Status: DC | PRN
  Administered 2016-01-02 (×2): 50 ug via INTRAVENOUS

## 2016-01-02 MED ORDER — LIDOCAINE HCL (CARDIAC) 20 MG/ML IV SOLN
INTRAVENOUS | Status: DC | PRN
  Administered 2016-01-02: 100 mg via INTRAVENOUS

## 2016-01-02 MED ORDER — ENOXAPARIN SODIUM 40 MG/0.4ML ~~LOC~~ SOLN
40.0000 mg | SUBCUTANEOUS | Status: AC
  Administered 2016-01-02: 40 mg via SUBCUTANEOUS
  Filled 2016-01-02: qty 0.4

## 2016-01-02 MED ORDER — FENTANYL CITRATE (PF) 100 MCG/2ML IJ SOLN
INTRAMUSCULAR | Status: AC
  Filled 2016-01-02: qty 2

## 2016-01-02 MED ORDER — HYDROMORPHONE HCL 1 MG/ML IJ SOLN
INTRAMUSCULAR | Status: DC | PRN
  Administered 2016-01-02 (×4): 0.5 mg via INTRAVENOUS

## 2016-01-02 MED ORDER — MEPERIDINE HCL 50 MG/ML IJ SOLN
6.2500 mg | INTRAMUSCULAR | Status: DC | PRN
  Administered 2016-01-02: 12.5 mg via INTRAVENOUS

## 2016-01-02 MED ORDER — MIDAZOLAM HCL 5 MG/5ML IJ SOLN
INTRAMUSCULAR | Status: DC | PRN
  Administered 2016-01-02: 2 mg via INTRAVENOUS

## 2016-01-02 MED ORDER — IBUPROFEN 800 MG PO TABS: 800.0000 mg | ORAL_TABLET | Freq: Three times a day (TID) | ORAL | Status: DC | PRN

## 2016-01-02 MED ORDER — FENTANYL CITRATE (PF) 100 MCG/2ML IJ SOLN
INTRAMUSCULAR | Status: DC | PRN
  Administered 2016-01-02 (×5): 50 ug via INTRAVENOUS

## 2016-01-02 MED ORDER — TRAMADOL HCL 50 MG PO TABS
100.0000 mg | ORAL_TABLET | Freq: Two times a day (BID) | ORAL | Status: DC | PRN
  Administered 2016-01-02 – 2016-01-03 (×2): 100 mg via ORAL
  Filled 2016-01-02 (×3): qty 2

## 2016-01-02 MED ORDER — ONDANSETRON HCL 4 MG PO TABS: 4.0000 mg | ORAL_TABLET | Freq: Four times a day (QID) | ORAL | Status: DC | PRN

## 2016-01-02 MED ORDER — KCL IN DEXTROSE-NACL 20-5-0.45 MEQ/L-%-% IV SOLN
INTRAVENOUS | Status: DC
  Administered 2016-01-02: 18:00:00 via INTRAVENOUS
  Filled 2016-01-02 (×2): qty 1000

## 2016-01-02 MED ORDER — LACTATED RINGERS IV SOLN
INTRAVENOUS | Status: DC | PRN
  Administered 2016-01-02: 13:00:00 via INTRAVENOUS

## 2016-01-02 MED ORDER — HYDROMORPHONE HCL 2 MG/ML IJ SOLN
INTRAMUSCULAR | Status: AC
Start: 2016-01-02 — End: 2016-01-02
  Filled 2016-01-02: qty 1

## 2016-01-02 MED ORDER — STERILE WATER FOR INJECTION IJ SOLN
INTRAMUSCULAR | Status: AC
  Filled 2016-01-02: qty 10

## 2016-01-02 MED ORDER — CEFAZOLIN SODIUM-DEXTROSE 2-4 GM/100ML-% IV SOLN
INTRAVENOUS | Status: AC
  Filled 2016-01-02: qty 100

## 2016-01-02 MED ORDER — ROCURONIUM BROMIDE 100 MG/10ML IV SOLN
INTRAVENOUS | Status: DC | PRN
  Administered 2016-01-02: 50 mg via INTRAVENOUS

## 2016-01-02 MED ORDER — ONDANSETRON HCL 4 MG/2ML IJ SOLN
INTRAMUSCULAR | Status: DC | PRN
  Administered 2016-01-02: 4 mg via INTRAVENOUS

## 2016-01-02 MED ORDER — FENTANYL CITRATE (PF) 250 MCG/5ML IJ SOLN
INTRAMUSCULAR | Status: AC
  Filled 2016-01-02: qty 5

## 2016-01-02 MED ORDER — DEXAMETHASONE SODIUM PHOSPHATE 10 MG/ML IJ SOLN
INTRAMUSCULAR | Status: AC
Start: 2016-01-02 — End: 2016-01-02
  Filled 2016-01-02: qty 1

## 2016-01-02 MED ORDER — ACETAMINOPHEN 10 MG/ML IV SOLN
1000.0000 mg | Freq: Once | INTRAVENOUS | Status: AC
  Administered 2016-01-02: 1000 mg via INTRAVENOUS

## 2016-01-02 MED ORDER — CLONAZEPAM 0.5 MG PO TABS: 0.5000 mg | ORAL_TABLET | Freq: Every day | ORAL | Status: DC | PRN

## 2016-01-02 MED ORDER — MEPERIDINE HCL 50 MG/ML IJ SOLN
INTRAMUSCULAR | Status: AC
  Filled 2016-01-02: qty 1

## 2016-01-02 MED ORDER — SUGAMMADEX SODIUM 200 MG/2ML IV SOLN
INTRAVENOUS | Status: AC
  Filled 2016-01-02: qty 2

## 2016-01-02 MED ORDER — LOSARTAN POTASSIUM-HCTZ 100-12.5 MG PO TABS: 1.0000 | ORAL_TABLET | Freq: Every day | ORAL | Status: DC

## 2016-01-02 MED ORDER — SUCCINYLCHOLINE CHLORIDE 20 MG/ML IJ SOLN
INTRAMUSCULAR | Status: DC | PRN
  Administered 2016-01-02: 100 mg via INTRAVENOUS

## 2016-01-02 MED ORDER — ONDANSETRON HCL 4 MG/2ML IJ SOLN: 4.0000 mg | Freq: Four times a day (QID) | INTRAMUSCULAR | Status: DC | PRN

## 2016-01-02 MED ORDER — STERILE WATER FOR IRRIGATION IR SOLN
Status: DC | PRN
  Administered 2016-01-02: 1000 mL

## 2016-01-02 MED ORDER — OXYCODONE-ACETAMINOPHEN 5-325 MG PO TABS: 1.0000 | ORAL_TABLET | ORAL | Status: DC | PRN

## 2016-01-02 MED ORDER — ACETAMINOPHEN 10 MG/ML IV SOLN
INTRAVENOUS | Status: AC
  Filled 2016-01-02: qty 100

## 2016-01-02 MED ORDER — PROPOFOL 10 MG/ML IV BOLUS
INTRAVENOUS | Status: DC | PRN
  Administered 2016-01-02: 100 mg via INTRAVENOUS
  Administered 2016-01-02: 80 mg via INTRAVENOUS
  Administered 2016-01-02: 200 mg via INTRAVENOUS

## 2016-01-02 MED ORDER — GABAPENTIN 300 MG PO CAPS
600.0000 mg | ORAL_CAPSULE | Freq: Every day | ORAL | Status: DC
Start: 2016-01-02 — End: 2016-01-03
  Administered 2016-01-02: 600 mg via ORAL
  Filled 2016-01-02: qty 2

## 2016-01-02 MED ORDER — HYDROMORPHONE HCL 1 MG/ML IJ SOLN
0.2000 mg | INTRAMUSCULAR | Status: DC | PRN
  Administered 2016-01-02: 0.5 mg via INTRAVENOUS
  Filled 2016-01-02 (×2): qty 1

## 2016-01-02 MED ORDER — DEXAMETHASONE SODIUM PHOSPHATE 10 MG/ML IJ SOLN
INTRAMUSCULAR | Status: DC | PRN
  Administered 2016-01-02: 10 mg via INTRAVENOUS

## 2016-01-02 MED ORDER — LACTATED RINGERS IV SOLN: INTRAVENOUS | Status: DC

## 2016-01-02 MED ORDER — LIDOCAINE HCL (CARDIAC) 20 MG/ML IV SOLN
INTRAVENOUS | Status: AC
  Filled 2016-01-02: qty 5

## 2016-01-02 SURGICAL SUPPLY — 57 items
APL ESCP 34 STRL LF DISP (HEMOSTASIS)
APPLICATOR SURGIFLO ENDO (HEMOSTASIS) IMPLANT
BAG SPEC RTRVL LRG 6X4 10 (ENDOMECHANICALS)
CHLORAPREP W/TINT 26ML (MISCELLANEOUS) ×3 IMPLANT
COVER SURGICAL LIGHT HANDLE (MISCELLANEOUS) ×3 IMPLANT
COVER TIP SHEARS 8 DVNC (MISCELLANEOUS) ×2 IMPLANT
COVER TIP SHEARS 8MM DA VINCI (MISCELLANEOUS) ×1
DRAPE ARM DVNC X/XI (DISPOSABLE) ×8 IMPLANT
DRAPE COLUMN DVNC XI (DISPOSABLE) ×2 IMPLANT
DRAPE DA VINCI XI ARM (DISPOSABLE) ×4
DRAPE DA VINCI XI COLUMN (DISPOSABLE) ×1
DRAPE SHEET LG 3/4 BI-LAMINATE (DRAPES) ×6 IMPLANT
DRAPE SURG IRRIG POUCH 19X23 (DRAPES) ×3 IMPLANT
ELECT REM PT RETURN 15FT ADLT (MISCELLANEOUS) ×3 IMPLANT
GLOVE BIO SURGEON STRL SZ 6 (GLOVE) ×12 IMPLANT
GLOVE BIO SURGEON STRL SZ 6.5 (GLOVE) ×6 IMPLANT
GOWN STRL REUS W/ TWL LRG LVL3 (GOWN DISPOSABLE) ×4 IMPLANT
GOWN STRL REUS W/TWL LRG LVL3 (GOWN DISPOSABLE) ×6
HOLDER FOLEY CATH W/STRAP (MISCELLANEOUS) ×3 IMPLANT
KIT BASIN OR (CUSTOM PROCEDURE TRAY) ×3 IMPLANT
KIT PROCEDURE DA VINCI SI (MISCELLANEOUS) ×1
KIT PROCEDURE DVNC SI (MISCELLANEOUS) IMPLANT
LIQUID BAND (GAUZE/BANDAGES/DRESSINGS) ×3 IMPLANT
MANIPULATOR UTERINE 4.5 ZUMI (MISCELLANEOUS) ×3 IMPLANT
MARKER SKIN DUAL TIP RULER LAB (MISCELLANEOUS) ×3 IMPLANT
NDL SAFETY ECLIPSE 18X1.5 (NEEDLE) ×2 IMPLANT
NDL SPNL 18GX3.5 QUINCKE PK (NEEDLE) ×2 IMPLANT
NEEDLE HYPO 18GX1.5 SHARP (NEEDLE) ×3
NEEDLE SPNL 18GX3.5 QUINCKE PK (NEEDLE) ×3 IMPLANT
OBTURATOR XI 8MM BLADELESS (TROCAR) ×3 IMPLANT
OCCLUDER COLPOPNEUMO (BALLOONS) ×3 IMPLANT
PAD POSITIONING PINK XL (MISCELLANEOUS) ×3 IMPLANT
PORT ACCESS TROCAR AIRSEAL 12 (TROCAR) ×2 IMPLANT
PORT ACCESS TROCAR AIRSEAL 5M (TROCAR) ×1
POUCH ENDO CATCH II 15MM (MISCELLANEOUS) IMPLANT
POUCH SPECIMEN RETRIEVAL 10MM (ENDOMECHANICALS) IMPLANT
SEAL CANN UNIV 5-8 DVNC XI (MISCELLANEOUS) ×8 IMPLANT
SEAL XI 5MM-8MM UNIVERSAL (MISCELLANEOUS) ×4
SET TRI-LUMEN FLTR TB AIRSEAL (TUBING) ×3 IMPLANT
SET TUBE IRRIG SUCTION NO TIP (IRRIGATION / IRRIGATOR) ×3 IMPLANT
SHEET LAVH (DRAPES) ×3 IMPLANT
SOLUTION ELECTROLUBE (MISCELLANEOUS) ×3 IMPLANT
SURGIFLO W/THROMBIN 8M KIT (HEMOSTASIS) IMPLANT
SUT MNCRL AB 4-0 PS2 18 (SUTURE) ×6 IMPLANT
SUT VIC AB 0 CT1 27 (SUTURE) ×9
SUT VIC AB 0 CT1 27XBRD ANTBC (SUTURE) ×2 IMPLANT
SYR 50ML LL SCALE MARK (SYRINGE) ×3 IMPLANT
SYRINGE 10CC LL (SYRINGE) ×3 IMPLANT
TOWEL OR 17X26 10 PK STRL BLUE (TOWEL DISPOSABLE) ×6 IMPLANT
TOWEL OR NON WOVEN STRL DISP B (DISPOSABLE) ×3 IMPLANT
TRAP SPECIMEN MUCOUS 40CC (MISCELLANEOUS) IMPLANT
TRAY FOLEY W/METER SILVER 14FR (SET/KITS/TRAYS/PACK) ×3 IMPLANT
TRAY LAPAROSCOPIC (CUSTOM PROCEDURE TRAY) ×3 IMPLANT
TROCAR BLADELESS OPT 5 100 (ENDOMECHANICALS) ×3 IMPLANT
UNDERPAD 30X30 (UNDERPADS AND DIAPERS) ×3 IMPLANT
UNDERPAD 30X30 INCONTINENT (UNDERPADS AND DIAPERS) ×3 IMPLANT
WATER STERILE IRR 1500ML POUR (IV SOLUTION) ×5 IMPLANT

## 2016-01-02 NOTE — Transfer of Care (Signed)
Immediate Anesthesia Transfer of Care Note  Patient: SHAREEFAH SCHUERGER  Procedure(s) Performed: Procedure(s): XI ROBOTIC ASSISTED TOTAL LAPAROSCOPIC HYSTERECTOMY WITH BILATERAL SALPINGO OOPHORECTOMY (Bilateral) SENTINAL LYMPH NODE BIOPSY (N/A)  Patient Location: PACU  Anesthesia Type:General  Level of Consciousness: awake, alert , oriented and patient cooperative  Airway & Oxygen Therapy: Patient Spontanous Breathing and Patient connected to face mask oxygen  Post-op Assessment: Report given to RN, Post -op Vital signs reviewed and stable and Patient moving all extremities  Post vital signs: Reviewed and stable  Last Vitals:  Filed Vitals:   01/02/16 1106  BP: 139/91  Pulse: 78  Temp: 36.7 C  Resp: 18    Last Pain: There were no vitals filed for this visit.       Complications: No apparent anesthesia complications

## 2016-01-02 NOTE — Op Note (Signed)
OPERATIVE NOTE 01/02/16  Surgeon: Donaciano Eva   Assistants: Dr Lahoma Crocker (an MD assistant was necessary for tissue manipulation, management of robotic instrumentation, retraction and positioning due to the complexity of the case and hospital policies).   Anesthesia: General endotracheal anesthesia  ASA Class: 3   Pre-operative Diagnosis: grade 2 endometrial cancer  Post-operative Diagnosis: same  Operation: Robotic-assisted laparoscopic total hysterectomy with bilateral salpingoophorectomy, sentinel lymph node biopsy, lymphadenectomy   Surgeon: Donaciano Eva  Assistant Surgeon: Lahoma Crocker MD  Anesthesia: GET  Urine Output: 200cc  Operative Findings:  : 8cm uterus, normal tubes and ovaries. FIlmy adhesions between the ovaries and uterus and sigmoid colon. No suspicious nodes. Unilateral mapping to right.  Estimated Blood Loss:  less than 50 mL      Total IV Fluids: 500 ml         Specimens: right obturator SLN, left pelvic lymph nodes, uterus, cervix, bilateral tubes and ovaries.         Complications:  None; patient tolerated the procedure well.         Disposition: PACU - hemodynamically stable.  Procedure Details  The patient was seen in the Holding Room. The risks, benefits, complications, treatment options, and expected outcomes were discussed with the patient.  The patient concurred with the proposed plan, giving informed consent.  The site of surgery properly noted/marked. The patient was identified as Michelle [facility]ario and the procedure verified as a Robotic-assisted hysterectomy with bilateral salpingo oophorectomy. A Time Out was held and the above information confirmed.  After induction of anesthesia, the patient was draped and prepped in the usual sterile manner. Pt was placed in supine position after anesthesia and draped and prepped in the usual sterile manner. The abdominal drape was placed after the CholoraPrep had been allowed to dry  for 3 minutes.  Her arms were tucked to her side with all appropriate precautions.  The shoulders were stabilized with padded shoulder blocks applied to the acromium processes.  The patient was placed in the semi-lithotomy position in [place].  The perineum was prepped with Betadine. The patient was then prepped. Foley catheter was placed.  A sterile speculum was placed in the vagina.  The cervix was grasped with a single-tooth tenaculum and dilated with Kennon Rounds dilators.  1mg  total of ICG was injected into the cervical stroma at 2 and 9 o'clock at a 82mm depth (concentration 0..5mg /ml). The ZUMI uterine manipulator with a medium colpotomizer ring was placed without difficulty.  A pneum occluder balloon was placed over the manipulator.  OG tube placement was confirmed and to suction.   Next, a 5 mm skin incision was made 1 cm below the subcostal margin in the midclavicular line.  The 5 mm Optiview port and scope was used for direct entry.  Opening pressure was under 10 mm CO2.  The abdomen was insufflated and the findings were noted as above.   At this point and all points during the procedure, the patient's intra-abdominal pressure did not exceed 15 mmHg. Next, a 10 mm skin incision was made in the umbilicus and a right and left port was placed about 10 cm lateral to the robot port on the right and left side.  A fourth arm was placed in the left lower quadrant 2 cm above and superior and medial to the anterior superior iliac spine.  All ports were placed under direct visualization.  The patient was placed in steep Trendelenburg.  Bowel was folded away into the  upper abdomen.  The robot was docked in the normal manner.  The right and left peritoneum were opened parallel to the IP ligament to open the retroperitoneal spaces bilaterally. The SLN mapping was performed in bilateral pelvic basins. The para rectal and paravesical spaces were opened up. Lymphatic channels were identified travelling to the following  visualized sentinel lymph node's: right obturator SLN. The left side failed to map and a completion lymphadenectomy was performed. These SLN's were separated from their surrounding lymphatic tissue, removed and sent for permanent pathology.  The hysterectomy was started after the round ligament on the right side was incised and the retroperitoneum was entered and the pararectal space was developed.  The ureter was noted to be on the medial leaf of the broad ligament.  The peritoneum above the ureter was incised and stretched and the infundibulopelvic ligament was skeletonized, cauterized and cut.  The posterior peritoneum was taken down to the level of the KOH ring.  The anterior peritoneum was also taken down.  The bladder flap was created to the level of the KOH ring.  The uterine artery on the right side was skeletonized, cauterized and cut in the normal manner.  A similar procedure was performed on the left.  The colpotomy was made and the uterus, cervix, bilateral ovaries and tubes were amputated and delivered through the vagina.  Pedicles were inspected and excellent hemostasis was achieved.     The left pelvic lymphadenectomy was performed. The paravesical space was developed with monopolar and sharp dissection. It was held open with tension on the median umbilical ligament with the forth arm. The paravesical space was opened with blunt and sharp dissection to mobilize the ureter off of the medial surface of the internal iliac artery. The medial leaf of the broad ligament containing the ureter was held medially (opening the pararectal space) by the assistant's grasper. The left pelvic lymphadenectomy was performed by skeletonizing the internal iliac artery at the bifurcation with the external iliac artery. The obturator nerve was identified in the base of lateral paravesical space. The ureter was mobilized medially off of the dissection by developing the pararectal space. The genitofemoral nerve was  identified, skeletonized and mobilized laterally off of the external iliac artery. An enbloc resection of lymph nodes was performed within the following boundaries: the mid portion of the common iliac proximally, the circumflex iliac vein distally, the obturator nerve posteriorally, the genitofemoral nerve laterally. The nodal basin (including obturator space) were confirmed to be empty of nodes and hemostatic. The nodes were placed in an endocatch bag and retrieved vaginally.  The colpotomy at the vaginal cuff was closed with Vicryl on a CT1 needle in a running manner.  Irrigation was used and excellent hemostasis was achieved.  At this point in the procedure was completed.  Robotic instruments were removed under direct visulaization.  The robot was undocked. The 10 mm ports were closed with Vicryl on a UR-5 needle and the fascia was closed with 0 Vicryl on a UR-5 needle.  The skin was closed with 4-0 Vicryl in a subcuticular manner.  Dermabond was applied.  Sponge, lap and needle counts correct x 2.  The patient was taken to the recovery room in stable condition.  The vagina was swabbed with  minimal bleeding noted.   All instrument and needle counts were correct x  3.   The patient was transferred to the recovery room in a stable condition.  Donaciano Eva, MD

## 2016-01-02 NOTE — Interval H&P Note (Signed)
History and Physical Interval Note:  01/02/2016 11:46 AM  Michelle Rosario  has presented today for surgery, with the diagnosis of ENDOMETRIAL CANCER   The various methods of treatment have been discussed with the patient and family. After consideration of risks, benefits and other options for treatment, the patient has consented to  Procedure(s): XI ROBOTIC [place] (Bilateral) SENTINAL LYMPH NODE BIOPSY (N/A) as a surgical intervention .  The patient's history has been reviewed, patient examined, no change in status, stable for surgery.  I have reviewed the patient's chart and labs.  Questions were answered to the patient's satisfaction.     Donaciano Eva

## 2016-01-02 NOTE — Anesthesia Procedure Notes (Signed)
Procedure Name: Intubation Date/Time: 01/02/2016 2:00 PM Performed by: Carleene Cooper A Pre-anesthesia Checklist: Patient identified, Emergency Drugs available, Suction available and Patient being monitored Patient Re-evaluated:Patient Re-evaluated prior to inductionOxygen Delivery Method: Circle system utilized Preoxygenation: Pre-oxygenation with 100% oxygen Intubation Type: IV induction Ventilation: Oral airway inserted - appropriate to patient size and Mask ventilation without difficulty Laryngoscope Size: Glidescope and 4 Grade View: Grade II Tube type: Glide rite Tube size: 7.5 mm Number of attempts: 3 Airway Equipment and Method: Video-laryngoscopy Placement Confirmation: ETT inserted through vocal cords under direct vision,  breath sounds checked- equal and bilateral and positive ETCO2 Secured at: 21 cm Tube secured with: Tape Dental Injury: Teeth and Oropharynx as per pre-operative assessment  Difficulty Due To: Difficulty was unanticipated Future Recommendations: Recommend- induction with short-acting agent, and alternative techniques readily available Comments: Smooth IV induction. DL X1 by Jaci Standard, EMT student. No vocal cords seen. Easy mask, DL X 1 by Dr. Glennon Mac. Grade 3-4 View. Masked. VSS. Sats 95-100%. DL X 1 with #4 Glidescope after head repositioned. Grade 2 view. BBS=, + ETCO2. ATOI.

## 2016-01-02 NOTE — H&P (View-Only) (Signed)
Consult Note: Gyn-Onc  Consult was requested by Dr. Pamala Hurry for the evaluation of Michelle Rosario 70 y.o. female  CC:  Chief Complaint  Patient presents with  . Endometrial Cancer    New Consultation    Assessment/Plan:  Michelle Rosario  is a 70 y.o.  year old with grade 2 endometrial cancer.   A detailed discussion was held with the patient and her family with regard to to her endometrial cancer diagnosis. We discussed the standard management options for uterine cancer which includes surgery followed possibly by adjuvant therapy depending on the results of surgery. The options for surgical management include a hysterectomy and removal of the tubes and ovaries possibly with removal of pelvic and para-aortic lymph nodes. A minimally invasive approach including a robotic hysterectomy or laparoscopic hysterectomy have benefits including shorter hospital stay, recovery time and better wound healing. The alternative approach is an open hysterectomy. The patient has been counseled about these surgical options and the risks of surgery in general including infection, bleeding, damage to surrounding structures (including bowel, bladder, ureters, nerves or vessels), and the postoperative risks of PE/ DVT, and lymphedema. I extensively reviewed the additional risks of robotic hysterectomy including possible need for conversion to open laparotomy.  I discussed positioning during surgery of trendelenberg and risks of minor facial swelling and care we take in preoperative positioning.  After counseling and consideration of her options, she desires to proceed with robotic assisted total hysterectomy, BSO, SLN biopsy.   She will be seen by anesthesia for preoperative clearance and discussion of postoperative pain management.  She was given the opportunity to ask questions, which were answered to her satisfaction, and she is agreement with the above mentioned plan of care.    HPI: The patient is a 70 year  old G1P1 who is seen in consultation at the request of Dr Pamala Hurry for grade 2 endometrial cancer. The patient reports a 1 month history of postmenopausal bleeding. A transvaginal ultrasound scan was performed on 09/11/2015. It revealed a uterus measuring 7.2 x 6 x 4.5 cm with a thickened endometrial stripe of 29 mm. The ovaries were normal bilaterally. 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  The patient is otherwise healthy. She is obese with a BMI of 38 kg meters squared. She has had no prior abdominal surgeries other than a tubal ligation. She has a past medical history significant for hypertension and hypothyroidism. She has had prior sampling for endometrium approximately 10 years ago which revealed simple endometrial hyperplasia. Her father has a history of bladder cancer that he was a smoker in his youth. Her mother had a diagnosis of early stage uterine cancer at advanced age.  Current Meds:  Outpatient Encounter Prescriptions as of 12/20/2015  Medication Sig  . B Complex Vitamins (VITAMIN B COMPLEX) TABS Take by mouth.  . Calcium Carbonate-Vitamin D (CALTRATE COLON HEALTH PO) Take 1 tablet by mouth daily.  Marland Kitchen gabapentin (NEURONTIN) 600 MG tablet Take by mouth.  . levothyroxine (SYNTHROID, LEVOTHROID) 75 MCG tablet   . losartan-hydrochlorothiazide (HYZAAR) 100-12.5 MG tablet Take 1 tablet by mouth daily.  . magnesium 30 MG tablet Take by mouth.  . megestrol (MEGACE) 40 MG tablet Take 80 mg by mouth daily.  . Melatonin 3 MG TBDP Take by mouth.  Marland Kitchen PARoxetine Mesylate (BRISDELLE) 7.5 MG CAPS   . tinidazole (TINDAMAX) 500 MG tablet Take 1,000 mg by mouth daily.  . traZODone (DESYREL) 50  MG tablet Take by mouth.  . [DISCONTINUED] lisinopril-hydrochlorothiazide (PRINZIDE,ZESTORETIC) 10-12.5 MG tablet Take by mouth.   No facility-administered encounter medications on file as of 12/20/2015.    Allergy: Not on  File  Social Hx:   Social History   Social History  . Marital Status: Divorced    Spouse Name: N/A  . Number of Children: N/A  . Years of Education: N/A   Occupational History  . Not on file.   Social History Main Topics  . Smoking status: Never Smoker   . Smokeless tobacco: Not on file  . Alcohol Use: Yes  . Drug Use: No  . Sexual Activity: Not on file   Other Topics Concern  . Not on file   Social History Narrative  . No narrative on file    Past Surgical Hx:  Past Surgical History  Procedure Laterality Date  . Replacement total knee Left 2015  . Fisure  2014  . Ankle surgery Right Oct 2006    Past Medical Hx:  Past Medical History  Diagnosis Date  . Uterine cancer [facility]     May 30 , 2017  . Hypertension     Past Gynecological History:  SVD x 1  No LMP recorded. Patient is postmenopausal.  Family Hx:  Family History  Problem Relation Age of Onset  . Anesthesia problems Mother   . Cancer Mother     uterine cancer  . Cancer Father   . Diabetes Sister     Review of Systems:  Constitutional  Feels well,    ENT Normal appearing ears and nares bilaterally Skin/Breast  No rash, sores, jaundice, itching, dryness Cardiovascular  No chest pain, shortness of breath, or edema  Pulmonary  No cough or wheeze.  Gastro Intestinal  No nausea, vomitting, or diarrhoea. No bright red blood per rectum, no abdominal pain, change in bowel movement, or constipation.  Genito Urinary  No frequency, urgency, dysuria, + PMP bleeding Musculo Skeletal  No myalgia, arthralgia, joint swelling or pain  Neurologic  No weakness, numbness, change in gait,  Psychology  No depression, anxiety, insomnia.   Vitals:  Blood pressure 130/83, pulse 88, temperature 98.6 F (37 C), temperature source Oral, resp. rate 18, height 5' 4.5" (1.638 m), weight 227 lb 1.6 oz (103.012 kg), SpO2 100 %.  Physical Exam: WD in NAD Neck  Supple NROM, without any enlargements.  Lymph Node  Survey No cervical supraclavicular or inguinal adenopathy Cardiovascular  Pulse normal rate, regularity and rhythm. S1 and S2 normal.  Lungs  Clear to auscultation bilateraly, without wheezes/crackles/rhonchi. Good air movement.  Skin  No rash/lesions/breakdown  Psychiatry  Alert and oriented to person, place, and time  Abdomen  Normoactive bowel sounds, abdomen soft, non-tender and obese without evidence of hernia.  Back No CVA tenderness Genito Urinary  Vulva/vagina: Normal external female genitalia.   No lesions. No discharge or bleeding.  Bladder/urethra:  No lesions or masses, well supported bladder  Vagina: normal  Cervix: Normal appearing, no lesions.  Uterus:  Small, mobile, no parametrial involvement or nodularity.  Adnexa: no palpable masses. Rectal  Good tone, no masses no cul de sac nodularity.  Extremities  No bilateral cyanosis, clubbing or edema.   Donaciano Eva, MD  12/20/2015, 6:19 PM

## 2016-01-02 NOTE — Anesthesia Postprocedure Evaluation (Signed)
Anesthesia Post Note  Patient: Michelle Rosario  Procedure(s) Performed: Procedure(s) (LRB): XI ROBOTIC ASSISTED TOTAL LAPAROSCOPIC HYSTERECTOMY WITH BILATERAL SALPINGO OOPHORECTOMY (Bilateral) SENTINAL LYMPH NODE BIOPSY (N/A)  Patient location during evaluation: PACU Anesthesia Type: General Level of consciousness: awake and alert Pain management: pain level controlled Vital Signs Assessment: post-procedure vital signs reviewed and stable Respiratory status: spontaneous breathing, nonlabored ventilation, respiratory function stable and patient connected to nasal cannula oxygen Cardiovascular status: blood pressure returned to baseline and stable Postop Assessment: no signs of nausea or vomiting Anesthetic complications: no    Last Vitals:  Filed Vitals:   01/02/16 1700 01/02/16 1711  BP: 152/88 161/88  Pulse: 69 97  Temp: 36.7 C 36.7 C  Resp: 15 15    Last Pain:  Filed Vitals:   01/02/16 1712  PainSc: Asleep                 Chudney Scheffler S

## 2016-01-02 NOTE — Anesthesia Preprocedure Evaluation (Addendum)
Anesthesia Evaluation  Patient identified by MRN, date of birth, ID band Patient awake    Reviewed: Allergy & Precautions, H&P , Patient's Chart, lab work & pertinent test results, reviewed documented beta blocker date and time   History of Anesthesia Complications (+) DIFFICULT AIRWAY and history of anesthetic complications  Airway Mallampati: II  TM Distance: >3 FB Neck ROM: full    Dental no notable dental hx.    Pulmonary    Pulmonary exam normal breath sounds clear to auscultation       Cardiovascular hypertension, On Medications  Rhythm:regular Rate:Normal     Neuro/Psych    GI/Hepatic   Endo/Other  Morbid obesity  Renal/GU      Musculoskeletal   Abdominal   Peds  Hematology   Anesthesia Other Findings HTN; NSR poss LVH  Reproductive/Obstetrics                            Anesthesia Physical Anesthesia Plan  ASA: III  Anesthesia Plan: General   Post-op Pain Management:    Induction: Intravenous  Airway Management Planned: Oral ETT  Additional Equipment:   Intra-op Plan:   Post-operative Plan: Extubation in OR  Informed Consent: I have reviewed the patients History and Physical, chart, labs and discussed the procedure including the risks, benefits and alternatives for the proposed anesthesia with the patient or authorized representative who has indicated his/her understanding and acceptance.   Dental Advisory Given and Dental advisory given  Plan Discussed with: CRNA and Surgeon  Anesthesia Plan Comments: (  Discussed general anesthesia, including possible nausea, instrumentation of airway, sore throat,pulmonary aspiration, etc. I asked if the were any outstanding questions, or  concerns before we proceeded. )        Anesthesia Quick Evaluation

## 2016-01-03 DIAGNOSIS — C541 Malignant neoplasm of endometrium: Secondary | ICD-10-CM | POA: Diagnosis not present

## 2016-01-03 LAB — BASIC METABOLIC PANEL
ANION GAP: 7 (ref 5–15)
BUN: 13 mg/dL (ref 6–20)
CALCIUM: 9.2 mg/dL (ref 8.9–10.3)
CO2: 25 mmol/L (ref 22–32)
Chloride: 103 mmol/L (ref 101–111)
Creatinine, Ser: 0.69 mg/dL (ref 0.44–1.00)
Glucose, Bld: 142 mg/dL — ABNORMAL HIGH (ref 65–99)
Potassium: 3.9 mmol/L (ref 3.5–5.1)
Sodium: 135 mmol/L (ref 135–145)

## 2016-01-03 LAB — CBC
HCT: 36.8 % (ref 36.0–46.0)
Hemoglobin: 12.5 g/dL (ref 12.0–15.0)
MCH: 27.3 pg (ref 26.0–34.0)
MCHC: 34 g/dL (ref 30.0–36.0)
MCV: 80.3 fL (ref 78.0–100.0)
Platelets: 248 10*3/uL (ref 150–400)
RBC: 4.58 MIL/uL (ref 3.87–5.11)
RDW: 12.9 % (ref 11.5–15.5)
WBC: 13.7 10*3/uL — ABNORMAL HIGH (ref 4.0–10.5)

## 2016-01-03 MED ORDER — TRAMADOL HCL 50 MG PO TABS: 50.0000 mg | ORAL_TABLET | Freq: Four times a day (QID) | ORAL | Status: DC | PRN

## 2016-01-03 NOTE — Discharge Instructions (Signed)
01/03/2016  Return to work: 4-6 weeks if applicable  Activity: 1. Be up and out of the bed during the day.  Take a nap if needed.  You may walk up steps but be careful and use the hand rail.  Stair climbing will tire you more than you think, you may need to stop part way and rest.   2. No lifting or straining for 6 weeks.  3. No driving for 1 week(s).  Do not drive if you are taking narcotic pain medicine.  4. Shower daily.  Use soap and water on your incision and pat dry; don't rub.  No tub baths until cleared by your surgeon.   5. No sexual activity and nothing in the vagina for 6-8 weeks.  6. You may experience a small amount of clear drainage from your incisions, which is normal.  If the drainage persists or increases, please call the office.   Diet: 1. Low sodium Heart Healthy Diet is recommended.  2. It is safe to use a laxative, such as Miralax or Colace, if you have difficulty moving your bowels.   Wound Care: 1. Keep clean and dry.  Shower daily.  Reasons to call the Doctor:  Fever - Oral temperature greater than 100.4 degrees Fahrenheit  Foul-smelling vaginal discharge  Difficulty urinating  Nausea and vomiting  Increased pain at the site of the incision that is unrelieved with pain medicine.  Difficulty breathing with or without chest pain  New calf pain especially if only on one side  Sudden, continuing increased vaginal bleeding with or without clots.   Contacts: For questions or concerns you should contact:  Dr. Everitt Amber at [phone]  Joylene John, NP at [phone]  After Hours: call [phone] and have the GYN Oncologist paged/contacted  Tramadol tablets What is this medicine? TRAMADOL (TRA ma dole) is a pain reliever. It is used to treat moderate to severe pain in adults. This medicine may be used for other purposes; ask your health care provider or pharmacist if you have questions. What should I tell my health care provider before I  take this medicine? They need to know if you have any of these conditions: -brain tumor -depression -drug abuse or addiction -head injury -if you frequently drink alcohol containing drinks -kidney disease or trouble passing urine -liver disease -lung disease, asthma, or breathing problems -seizures or epilepsy -suicidal thoughts, plans, or attempt; a previous suicide attempt by you or a family member -an unusual or allergic reaction to tramadol, codeine, other medicines, foods, dyes, or preservatives -pregnant or trying to get pregnant -breast-feeding How should I use this medicine? Take this medicine by mouth with a full glass of water. Follow the directions on the prescription label. If the medicine upsets your stomach, take it with food or milk. Do not take more medicine than you are told to take. Talk to your pediatrician regarding the use of this medicine in children. Special care may be needed. Overdosage: If you think you have taken too much of this medicine contact a poison control center or emergency room at once. NOTE: This medicine is only for you. Do not share this medicine with others. What if I miss a dose? If you miss a dose, take it as soon as you can. If it is almost time for your next dose, take only that dose. Do not take double or extra doses. What may interact with this medicine? Do not take this medicine with any of the following medications: -MAOIs like  Carbex, Eldepryl, Marplan, Nardil, and Parnate This medicine may also interact with the following medications: -alcohol or medicines that contain alcohol -antihistamines -benzodiazepines -bupropion -carbamazepine or oxcarbazepine -clozapine -cyclobenzaprine -digoxin -furazolidone -linezolid -medicines for depression, anxiety, or psychotic disturbances -medicines for migraine headache like almotriptan, eletriptan, frovatriptan, naratriptan, rizatriptan, sumatriptan, zolmitriptan -medicines for pain like  pentazocine, buprenorphine, butorphanol, meperidine, nalbuphine, and propoxyphene -medicines for sleep -muscle relaxants -naltrexone -phenobarbital -phenothiazines like perphenazine, thioridazine, chlorpromazine, mesoridazine, fluphenazine, prochlorperazine, promazine, and trifluoperazine -procarbazine -warfarin This list may not describe all possible interactions. Give your health care provider a list of all the medicines, herbs, non-prescription drugs, or dietary supplements you use. Also tell them if you smoke, drink alcohol, or use illegal drugs. Some items may interact with your medicine. What should I watch for while using this medicine? Tell your doctor or health care professional if your pain does not go away, if it gets worse, or if you have new or a different type of pain. You may develop tolerance to the medicine. Tolerance means that you will need a higher dose of the medicine for pain relief. Tolerance is normal and is expected if you take this medicine for a long time. Do not suddenly stop taking your medicine because you may develop a severe reaction. Your body becomes used to the medicine. This does NOT mean you are addicted. Addiction is a behavior related to getting and using a drug for a non-medical reason. If you have pain, you have a medical reason to take pain medicine. Your doctor will tell you how much medicine to take. If your doctor wants you to stop the medicine, the dose will be slowly lowered over time to avoid any side effects. You may get drowsy or dizzy. Do not drive, use machinery, or do anything that needs mental alertness until you know how this medicine affects you. Do not stand or sit up quickly, especially if you are an older patient. This reduces the risk of dizzy or fainting spells. Alcohol can increase or decrease the effects of this medicine. Avoid alcoholic drinks. You may have constipation. Try to have a bowel movement at least every 2 to 3 days. If you do not  have a bowel movement for 3 days, call your doctor or health care professional. Your mouth may get dry. Chewing sugarless gum or sucking hard candy, and drinking plenty of water may help. Contact your doctor if the problem does not go away or is severe. What side effects may I notice from receiving this medicine? Side effects that you should report to your doctor or health care professional as soon as possible: -allergic reactions like skin rash, itching or hives, swelling of the face, lips, or tongue -breathing difficulties, wheezing -confusion -itching -light headedness or fainting spells -redness, blistering, peeling or loosening of the skin, including inside the mouth -seizures Side effects that usually do not require medical attention (report to your doctor or health care professional if they continue or are bothersome): -constipation -dizziness -drowsiness -headache -nausea, vomiting This list may not describe all possible side effects. Call your doctor for medical advice about side effects. You may report side effects to FDA at 1-800-FDA-1088. Where should I keep my medicine? Keep out of the reach of children. This medicine may cause accidental overdose and death if it taken by other adults, children, or pets. Mix any unused medicine with a substance like cat litter or coffee grounds. Then throw the medicine away in a sealed container like a sealed bag or  a coffee can with a lid. Do not use the medicine after the expiration date. Store at room temperature between 15 and 30 degrees C (59 and 86 degrees F). NOTE: This sheet is a summary. It may not cover all possible information. If you have questions about this medicine, talk to your doctor, pharmacist, or health care provider.    2016, Elsevier/Gold Standard. (2013-08-20 15:42:09)  Abdominal Hysterectomy, Care After These instructions give you information on caring for yourself after your procedure. Your doctor may also give you  more specific instructions. Call your doctor if you have any problems or questions after your procedure.  HOME CARE It takes 4-6 weeks to recover from this surgery. Follow all of your doctor's instructions.   Only take medicines as told by your doctor.  Take showers for 2-3 weeks. Ask your doctor when it is okay to shower.  Do not douche, use tampons, or have sex (intercourse) for at least 6 weeks or as told.  Follow your doctor's advice about exercise, lifting objects, driving, and general activities.  Get plenty of rest and sleep.  Do not lift anything heavier than a gallon of milk (about 10 pounds [4.5 kilograms]) for the first month after surgery.  Get back to your normal diet as told by your doctor.  Do not drink alcohol until your doctor says it is okay.  Take a medicine to help you poop (laxative) as told by your doctor.  Eating foods high in fiber may help you poop. Eat a lot of raw fruits and vegetables, whole grains, and beans.  Drink enough fluids to keep your pee (urine) clear or pale yellow.  Have someone help you at home for 1-2 weeks after your surgery.  Keep follow-up doctor visits as told. GET HELP IF:  You have chills or fever.  You have puffiness, redness, or pain in area of the cut (incision).  You have yellowish-white fluid (pus) coming from the cut.  You have a bad smell coming from the cut or bandage.  Your cut pulls apart.  You feel dizzy or light-headed.  You have pain or bleeding when you pee.  You keep having watery poop (diarrhea).  You keep feeling sick to your stomach (nauseous) or keep throwing up (vomiting).  You have fluid (discharge) coming from your vagina.  You have a rash.  You have a reaction to your medicine.  You need stronger pain medicine. GET HELP RIGHT AWAY IF:   You have a fever and your symptoms suddenly get worse.  You have bad belly (abdominal) pain.  You have chest pain.  You are short of breath.  You  pass out (faint).  You have pain, puffiness, or redness of your leg.  You bleed a lot from your vagina and notice clumps of tissue (clots). MAKE SURE YOU:   Understand these instructions.  Will watch your condition.  Will get help right away if you are not doing well or get worse.   This information is not intended to replace advice given to you by your health care provider. Make sure you discuss any questions you have with your health care provider.   Document Released: 04/02/2008 Document Revised: 06/29/2013 Document Reviewed: 04/16/2013 Elsevier Interactive Patient Education [employer].

## 2016-01-03 NOTE — Progress Notes (Signed)
Discharge instructions given to patient along with prescriptions.

## 2016-01-03 NOTE — Discharge Summary (Signed)
Physician Discharge Summary  Patient ID: Michelle Rosario MRN: [id number] DOB/AGE: 10/21/1945 70 y.o.  Admit date: 01/02/2016 Discharge date: 01/03/2016  Admission Diagnoses: Endometrial cancer [facility])  Discharge Diagnoses:  Principal Problem:   Endometrial cancer [facility])   Discharged Condition:  The patient is in good condition and stable for discharge.    Hospital Course: On 01/02/2016, the patient underwent the following: Procedure(s): XI ROBOTIC ASSISTED TOTAL LAPAROSCOPIC HYSTERECTOMY WITH BILATERAL SALPINGO OOPHORECTOMY SENTINEL LYMPH NODE BIOPSY.   The postoperative course was uneventful.  She was discharged to home on postoperative day 1 tolerating a regular diet, voiding, minimal pain.  Consults: None  Significant Diagnostic Studies: None  Treatments: surgery: see above  Discharge Exam: Blood pressure 158/80, pulse 78, temperature 98.2 F (36.8 C), temperature source Oral, resp. rate 16, height 5' 4.5" (1.638 m), weight 224 lb (101.606 kg), SpO2 98 %. General appearance: alert, cooperative and no distress Resp: clear to auscultation bilaterally Cardio: regular rate and rhythm, S1, S2 normal, no murmur, click, rub or gallop GI: soft, non-tender; bowel sounds normal; no masses,  no organomegaly Extremities: extremities normal, atraumatic, no cyanosis or edema Incision/Wound: Lap sites to the abdomen with dermabond without erythema or drainage  Disposition: Home      Discharge Instructions    Call MD for:  difficulty breathing, headache or visual disturbances    Complete by:  As directed      Call MD for:  extreme fatigue    Complete by:  As directed      Call MD for:  hives    Complete by:  As directed      Call MD for:  persistant dizziness or light-headedness    Complete by:  As directed      Call MD for:  persistant nausea and vomiting    Complete by:  As directed      Call MD for:  redness, tenderness, or signs of infection (pain, swelling, redness, odor or  green/yellow discharge around incision site)    Complete by:  As directed      Call MD for:  severe uncontrolled pain    Complete by:  As directed      Call MD for:  temperature >100.4    Complete by:  As directed      Diet - low sodium heart healthy    Complete by:  As directed      Driving Restrictions    Complete by:  As directed   No driving for 1 week.  Do not take narcotics and drive.     Increase activity slowly    Complete by:  As directed      Lifting restrictions    Complete by:  As directed   No lifting greater than 10 lbs.     Sexual Activity Restrictions    Complete by:  As directed   No sexual activity, nothing in the vagina, for 6-8 weeks.            Medication List    STOP taking these medications        megestrol 40 MG tablet  Commonly known as:  MEGACE      TAKE these medications        BRISDELLE 7.5 MG Caps  Generic drug:  PARoxetine Mesylate  Take 7.5 mg by mouth daily.     CALTRATE COLON HEALTH PO  Take 1 tablet by mouth daily.     clonazePAM 0.5 MG tablet  Commonly known as:  KLONOPIN  Take 0.5 mg by mouth daily as needed (restless legs).     gabapentin 600 MG tablet  Commonly known as:  NEURONTIN  Take 600 mg by mouth at bedtime.     levothyroxine 75 MCG tablet  Commonly known as:  SYNTHROID, LEVOTHROID  Take 75 mcg by mouth daily before breakfast.     losartan-hydrochlorothiazide 100-12.5 MG tablet  Commonly known as:  HYZAAR  Take 1 tablet by mouth daily.     magnesium 30 MG tablet  Take 30 mg by mouth daily.     Melatonin 3 MG Tbdp  Take 3 mg by mouth at bedtime.     multivitamin with minerals Tabs tablet  Take 1 tablet by mouth daily.     POTASSIMIN 75 MG Tabs  Generic drug:  Potassium  Take 75 mg by mouth daily.     traMADol 50 MG tablet  Commonly known as:  ULTRAM  Take 1-2 tablets (50-100 mg total) by mouth every 6 (six) hours as needed.     traZODone 50 MG tablet  Commonly known as:  DESYREL  Take 50 mg by mouth  at bedtime.     Vitamin B Complex Tabs  Take 1 tablet by mouth daily.       Follow-up Information    Follow up with Donaciano Eva, MD On 01/24/2016.   Specialty:  Obstetrics and Gynecology   Why:  at 3:30 pm at the [facility].   Contact information:   [address] [place] [address] [phone]       Greater than thirty minutes were spend for face to face discharge instructions and discharge orders/summary in EPIC.   Signed: CROSS, MELISSA DEAL 01/03/2016, 9:43 AM

## 2016-01-08 ENCOUNTER — Telehealth: Payer: Self-pay | Admitting: Gynecologic Oncology

## 2016-01-08 NOTE — Telephone Encounter (Signed)
Attempted to call patient and see how she is post-op and to discuss path.  Advised to please call the office to discuss.

## 2016-01-10 ENCOUNTER — Telehealth: Payer: Self-pay | Admitting: Gynecologic Oncology

## 2016-01-10 NOTE — Telephone Encounter (Signed)
Post op telephone call to check patient status.  Patient describes expected post operative status.  Adequate PO intake reported.  Bowels and bladder functioning without difficulty.  Pain minimal.  Reportable signs and symptoms reviewed.  Follow up appt previously arranged.  Informed of final path results with Dr. Serita Grit recommendations for no adjuvant therapy.

## 2016-01-11 ENCOUNTER — Telehealth: Payer: Self-pay | Admitting: Gynecologic Oncology

## 2016-01-11 NOTE — Telephone Encounter (Signed)
Returned call to patient.  All questions answered.  Bowels and bladder functioning. Advised to call for any needs.

## 2016-01-24 ENCOUNTER — Encounter: Payer: Self-pay | Admitting: Gynecologic Oncology

## 2016-01-24 ENCOUNTER — Ambulatory Visit: Payer: Medicare Other | Attending: Gynecologic Oncology | Admitting: Gynecologic Oncology

## 2016-01-24 VITALS — BP 129/75 | Temp 98.7°F | Resp 18 | Wt 224.2 lb

## 2016-01-24 DIAGNOSIS — E039 Hypothyroidism, unspecified: Secondary | ICD-10-CM | POA: Insufficient documentation

## 2016-01-24 DIAGNOSIS — N950 Postmenopausal bleeding: Secondary | ICD-10-CM | POA: Diagnosis not present

## 2016-01-24 DIAGNOSIS — Z90722 Acquired absence of ovaries, bilateral: Secondary | ICD-10-CM | POA: Diagnosis not present

## 2016-01-24 DIAGNOSIS — Z90710 Acquired absence of both cervix and uterus: Secondary | ICD-10-CM | POA: Insufficient documentation

## 2016-01-24 DIAGNOSIS — Z9109 Other allergy status, other than to drugs and biological substances: Secondary | ICD-10-CM | POA: Insufficient documentation

## 2016-01-24 DIAGNOSIS — Z8049 Family history of malignant neoplasm of other genital organs: Secondary | ICD-10-CM | POA: Insufficient documentation

## 2016-01-24 DIAGNOSIS — Z9889 Other specified postprocedural states: Secondary | ICD-10-CM | POA: Insufficient documentation

## 2016-01-24 DIAGNOSIS — C541 Malignant neoplasm of endometrium: Secondary | ICD-10-CM | POA: Diagnosis present

## 2016-01-24 DIAGNOSIS — Z6838 Body mass index (BMI) 38.0-38.9, adult: Secondary | ICD-10-CM | POA: Diagnosis not present

## 2016-01-24 DIAGNOSIS — Z8542 Personal history of malignant neoplasm of other parts of uterus: Secondary | ICD-10-CM | POA: Diagnosis not present

## 2016-01-24 DIAGNOSIS — I10 Essential (primary) hypertension: Secondary | ICD-10-CM | POA: Diagnosis not present

## 2016-01-24 DIAGNOSIS — Z800 Family history of malignant neoplasm of digestive organs: Secondary | ICD-10-CM | POA: Insufficient documentation

## 2016-01-24 DIAGNOSIS — E669 Obesity, unspecified: Secondary | ICD-10-CM | POA: Insufficient documentation

## 2016-01-24 DIAGNOSIS — Z96652 Presence of left artificial knee joint: Secondary | ICD-10-CM | POA: Insufficient documentation

## 2016-01-24 NOTE — Patient Instructions (Signed)
You should return to see Dr Denman George for follow-up in January, 2018. If you contact the office at [phone] after October 1st, 2017 this appointment calendar should be available.  You will see Dr Valentino Saxon for follow-up in July, 2017.  Please contact Dr Denman George at the above number if you have any concerns including vaginal bleeding or new pain.

## 2016-01-25 NOTE — Progress Notes (Signed)
Consult Note: Gyn-Onc  Consult was requested by Dr. Pamala Hurry for the evaluation of Michelle Rosario 70 y.o. female  CC:  Chief Complaint  Patient presents with  . endometrial cancer    Follow up visit     Assessment/Plan:  Michelle Rosario  is a 70 y.o.  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  Pathology revealed low risk factors for recurrence, therefore no adjuvant therapy is recommended according to NCCN guidelines.  I discussed risk for recurrence and typical symptoms encouraged her to notify us of these should they develop between visits.  I recommend she have follow-up every 6 months for 5 years in accordance with NCCN guidelines. Those visits should include symptom assessment, physical exam and pelvic examination. Pap smears are not indicated or recommended in the routine surveillance of endometrial cancer.  HPI: The patient is a 70 year old G1P1 who was seen in consultation at the request of Dr Pamala Hurry for grade 2 endometrial cancer. The patient reports a 1 month history of postmenopausal bleeding. A transvaginal ultrasound scan was performed on 09/11/2015. It revealed a uterus measuring 7.2 x 6 x 4.5 cm with a thickened endometrial stripe of 29 mm. The ovaries were normal bilaterally. 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  She is obese with a BMI of 38 kg meters squared. Her father has a history of bladder cancer that he was a smoker in his youth. Her mother had a diagnosis of early stage uterine cancer at advanced age.  Interval Hx: On 01/02/16 she underwent robotic assisted total hysterectomy, BSO, SLN biopsy. Surgery was uneventful. Postoperative pathology revealed: a 4.7cm grade 2 tumor with 4 mm of 2.5cm myometrial invasion. No LVSI, no cervical or adnexal involvement. The SLN's were negative.   Postoperatively she has done well with no  concerns. She denies pain or bleeding.  Current Meds:  Outpatient Encounter Prescriptions as of 01/24/2016  Medication Sig  . B Complex Vitamins (VITAMIN B COMPLEX) TABS Take 1 tablet by mouth daily.   . Calcium Carbonate-Vitamin D (CALTRATE COLON HEALTH PO) Take 1 tablet by mouth daily.  . clonazePAM (KLONOPIN) 0.5 MG tablet Take 0.5 mg by mouth daily as needed (restless legs).  . gabapentin (NEURONTIN) 600 MG tablet Take 600 mg by mouth at bedtime.   Marland Kitchen levothyroxine (SYNTHROID, LEVOTHROID) 75 MCG tablet Take 75 mcg by mouth daily before breakfast.   . losartan-hydrochlorothiazide (HYZAAR) 100-12.5 MG tablet Take 1 tablet by mouth daily.  . magnesium 30 MG tablet Take 30 mg by mouth daily.   . Melatonin 3 MG TBDP Take 3 mg by mouth at bedtime.   . Multiple Vitamin (MULTIVITAMIN WITH MINERALS) TABS tablet Take 1 tablet by mouth daily.  Marland Kitchen PARoxetine Mesylate (BRISDELLE) 7.5 MG CAPS Take 7.5 mg by mouth daily.   . Potassium (POTASSIMIN) 75 MG TABS Take 75 mg by mouth daily.  . traZODone (DESYREL) 50 MG tablet Take 50 mg by mouth at bedtime.   . [DISCONTINUED] traMADol (ULTRAM) 50 MG tablet Take 1-2 tablets (50-100 mg total) by mouth every 6 (six) hours as needed.   No facility-administered encounter medications on file as of 01/24/2016.    Allergy:  Allergies  Allergen Reactions  . Other Itching and Rash    Contact dermatitis noted on [facility] notes  Patient denies; does not recall    Social Hx:   Social History  Social History  . Marital Status: Divorced    Spouse Name: N/A  . Number of Children: N/A  . Years of Education: N/A   Occupational History  . Not on file.   Social History Main Topics  . Smoking status: Never Smoker   . Smokeless tobacco: Never Used  . Alcohol Use: Yes     Comment: socially  . Drug Use: No  . Sexual Activity: Not on file   Other Topics Concern  . Not on file   Social History Narrative    Past Surgical Hx:  Past Surgical History   Procedure Laterality Date  . Replacement total knee Left 2015  . Fisure  2014  . Ankle surgery Right Oct 2006  . Achellis tendon repaired in 2005    . Rectal fissure surgery 2010    . Tubal ligation    . Robotic assisted total hysterectomy with bilateral salpingo oopherectomy Bilateral 01/02/2016    Procedure: XI ROBOTIC ASSISTED TOTAL LAPAROSCOPIC HYSTERECTOMY WITH BILATERAL SALPINGO OOPHORECTOMY;  Surgeon: Everitt Amber, MD;  Location: [facility];  Service: Gynecology;  Laterality: Bilateral;  . Lymph node biopsy N/A 01/02/2016    Procedure: SENTINAL LYMPH NODE BIOPSY;  Surgeon: Everitt Amber, MD;  Location: [facility];  Service: Gynecology;  Laterality: N/A;    Past Medical Hx:  Past Medical History  Diagnosis Date  . Uterine cancer [facility])     May 30 , 2017  . Hypertension   . Family history of adverse reaction to anesthesia     mother post op N&V  . Hypothyroidism   . Broken ribs     hx of    Past Gynecological History:  SVD x 1  No LMP recorded. Patient is postmenopausal.  Family Hx:  Family History  Problem Relation Age of Onset  . Anesthesia problems Mother   . Cancer Mother     uterine cancer  . Cancer Father   . Diabetes Sister     Review of Systems:  Constitutional  Feels well,    ENT Normal appearing ears and nares bilaterally Skin/Breast  No rash, sores, jaundice, itching, dryness Cardiovascular  No chest pain, shortness of breath, or edema  Pulmonary  No cough or wheeze.  Gastro Intestinal  No nausea, vomitting, or diarrhoea. No bright red blood per rectum, no abdominal pain, change in bowel movement, or constipation.  Genito Urinary  No frequency, urgency, dysuria, + PMP bleeding Musculo Skeletal  No myalgia, arthralgia, joint swelling or pain  Neurologic  No weakness, numbness, change in gait,  Psychology  No depression, anxiety, insomnia.   Vitals:  Blood pressure 129/75, temperature 98.7 F (37.1 C), temperature source Oral, resp. rate 18, weight 224 lb  3.2 oz (101.696 kg), SpO2 98 %.  Physical Exam: WD in NAD Neck  Supple NROM, without any enlargements.  Lymph Node Survey No cervical supraclavicular or inguinal adenopathy Cardiovascular  Pulse normal rate, regularity and rhythm. S1 and S2 normal.  Lungs  Clear to auscultation bilateraly, without wheezes/crackles/rhonchi. Good air movement.  Skin  No rash/lesions/breakdown  Psychiatry  Alert and oriented to person, place, and time  Abdomen  Normoactive bowel sounds, abdomen soft, non-tender and obese without evidence of hernia. Well healed incisions. Back No CVA tenderness Genito Urinary  Surgically absent uterus, cervix. Well healed vaginal cuff. No bleeding. No masses. Rectal  Good tone, no masses no cul de sac nodularity.  Extremities  No bilateral cyanosis, clubbing or edema.   20 minutes of direct face to face  counseling time was spent with the patient. This included discussion about prognosis, therapy recommendations and postoperative side effects and are beyond the scope of routine postoperative care.  Donaciano Eva, MD  01/25/2016, 9:19 AM

## 2016-02-08 DIAGNOSIS — M5430 Sciatica, unspecified side: Secondary | ICD-10-CM | POA: Insufficient documentation

## 2016-02-08 DIAGNOSIS — B351 Tinea unguium: Secondary | ICD-10-CM | POA: Insufficient documentation

## 2016-02-08 DIAGNOSIS — I10 Essential (primary) hypertension: Secondary | ICD-10-CM | POA: Insufficient documentation

## 2016-02-08 DIAGNOSIS — F418 Other specified anxiety disorders: Secondary | ICD-10-CM | POA: Insufficient documentation

## 2016-02-08 DIAGNOSIS — E039 Hypothyroidism, unspecified: Secondary | ICD-10-CM | POA: Insufficient documentation

## 2016-02-08 DIAGNOSIS — M1990 Unspecified osteoarthritis, unspecified site: Secondary | ICD-10-CM | POA: Insufficient documentation

## 2016-05-13 ENCOUNTER — Telehealth: Payer: Self-pay

## 2016-05-13 NOTE — Telephone Encounter (Signed)
Patient's called returned with recommendations from [facility] [place] for Post-op vaginal bleeding for the past "3 days" . Patient's surgery was on January 02, 2016 with Dr Everitt Amber . Patient was scheduled for Wednesday November 08,2017 with Dr Gillian Scarce at 1:45 PM . Patient agreed with the plan and is in agreement to see Dr Gillian Scarce.

## 2016-05-15 ENCOUNTER — Ambulatory Visit: Payer: Medicare Other | Attending: Gynecologic Oncology | Admitting: Gynecologic Oncology

## 2016-05-15 ENCOUNTER — Other Ambulatory Visit (HOSPITAL_BASED_OUTPATIENT_CLINIC_OR_DEPARTMENT_OTHER): Payer: Medicare Other

## 2016-05-15 ENCOUNTER — Encounter: Payer: Self-pay | Admitting: Gynecologic Oncology

## 2016-05-15 VITALS — BP 159/79 | HR 71 | Temp 97.5°F | Resp 18 | Ht 64.5 in | Wt 222.7 lb

## 2016-05-15 DIAGNOSIS — N939 Abnormal uterine and vaginal bleeding, unspecified: Secondary | ICD-10-CM | POA: Diagnosis not present

## 2016-05-15 DIAGNOSIS — Z9889 Other specified postprocedural states: Secondary | ICD-10-CM | POA: Diagnosis not present

## 2016-05-15 DIAGNOSIS — C541 Malignant neoplasm of endometrium: Secondary | ICD-10-CM

## 2016-05-15 DIAGNOSIS — Z809 Family history of malignant neoplasm, unspecified: Secondary | ICD-10-CM | POA: Diagnosis not present

## 2016-05-15 LAB — BASIC METABOLIC PANEL
ANION GAP: 9 meq/L (ref 3–11)
BUN: 18.7 mg/dL (ref 7.0–26.0)
CALCIUM: 9.3 mg/dL (ref 8.4–10.4)
CO2: 26 mEq/L (ref 22–29)
Chloride: 107 mEq/L (ref 98–109)
Creatinine: 0.8 mg/dL (ref 0.6–1.1)
EGFR: 75 mL/min/{1.73_m2} — AB (ref >90)
GLUCOSE: 87 mg/dL (ref 70–140)
POTASSIUM: 3.9 meq/L (ref 3.5–5.1)
SODIUM: 142 meq/L (ref 136–145)

## 2016-05-15 NOTE — Patient Instructions (Signed)
Plan to have a CT scan of the chest, abdomen, and pelvis to rule out metastatic disease.  We will call you with the results of your biopsy from today.  Please call for any questions or concerns.  We will have you follow up in the office to discuss the plan.

## 2016-05-15 NOTE — Progress Notes (Signed)
GYNECOLOGIC ONCOLOGY RETURN VISIT  05/15/16  REASON FOR VISIT: Vaginal bleeding  ASSESSMENT AND PLAN: Michelle Rosario is a 70 y.o. 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  Based on my exam today the patient has two vaginal drop metastasis. Biopsy is obtained to confirm the diagnosis. She also has what appears to be a palpable mass at the vaginal apex concerning for recurrence. We will obtain a CT of the chest/abdomen/pelvis for re-staging to determine the extent of disease and guide therapy. The patient will return to clinic to discuss CT results with the first available provider. We discussed that radiation can be used to treat isolated vaginal recurrences, but chemotherapy may be recommended as well depending on the results of her imaging.  Gillian Scarce, MD   HPI: The patient is a 70 year old G1P1 who was initially seen in consultation at the request of Dr Pamala Hurry for grade 2 endometrial cancer. A transvaginal ultrasound scan was performed on 09/11/2015. It revealed a uterus measuring 7.2 x 6 x 4.5 cm with a thickened endometrial stripe of 29 mm. The ovaries were normal bilaterally. 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 Her father has a history of bladder cancer that he was a smoker in his youth. Her mother had a diagnosis of early stage uterine cancer at advanced age.  On 01/02/16 she underwent robotic assisted total hysterectomy, BSO, SLN biopsy. Surgery was uneventful. Postoperative pathology revealed: a 4.7cm grade 2 tumor with 4 mm of 2.5cm myometrial invasion. No LVSI, no cervical or adnexal involvement. The SLN's were negative.   Postoperatively she has done well.  Interval history:  She noted vaginal bleeding last week. She has no other complaints. No changes in bowel or bladder habits.  Review of  Systems: Constitutional  Feels well,    ENT Normal appearing ears and nares bilaterally Skin/Breast  No rash, sores, jaundice, itching, dryness Cardiovascular  No chest pain, shortness of breath, or edema  Pulmonary  No cough or wheeze.  Gastro Intestinal  No nausea, vomitting, or diarrhoea. No bright red blood per rectum, no abdominal pain, change in bowel movement, or constipation.  Genito Urinary  No frequency, urgency, dysuria, + PMP bleeding Musculo Skeletal  No myalgia, arthralgia, joint swelling or pain  Neurologic  No weakness, numbness, change in gait,  Psychology  No depression, anxiety, insomnia.   PAST MEDICAL/SURGICAL HX: Past Medical History:  Diagnosis Date  . Broken ribs    hx of  . Family history of adverse reaction to anesthesia    mother post op N&V  . Hypertension   . Hypothyroidism   . Uterine cancer [facility])    May 30 , 2017    Past Surgical History:  Procedure Laterality Date  . achellis tendon repaired in 2005    . ANKLE SURGERY Right Oct 2006  . fisure  2014  . LYMPH NODE BIOPSY N/A 01/02/2016   Procedure: SENTINAL LYMPH NODE BIOPSY;  Surgeon: Everitt Amber, MD;  Location: [facility];  Service: Gynecology;  Laterality: N/A;  . rectal fissure surgery 2010    . REPLACEMENT TOTAL KNEE Left 2015  . ROBOTIC ASSISTED TOTAL HYSTERECTOMY WITH BILATERAL SALPINGO OOPHERECTOMY Bilateral 01/02/2016   Procedure: XI ROBOTIC ASSISTED TOTAL LAPAROSCOPIC HYSTERECTOMY WITH BILATERAL SALPINGO OOPHORECTOMY;  Surgeon: Everitt Amber, MD;  Location: [facility];  Service: Gynecology;  Laterality: Bilateral;  . TUBAL LIGATION  Family History  Problem Relation Age of Onset  . Anesthesia problems Mother   . Cancer Mother     uterine cancer  . Cancer Father   . Diabetes Sister     Social History   Social History  . Marital status: Divorced    Spouse name: N/A  . Number of children: N/A  . Years of education: N/A   Social History Main Topics  . Smoking status: Never  Smoker  . Smokeless tobacco: Never Used  . Alcohol use Yes     Comment: socially  . Drug use: No  . Sexual activity: Not Asked   Other Topics Concern  . None   Social History Narrative  . None    CURRENT MEDICATIONS:  Current Outpatient Prescriptions:  .  ALPRAZolam (XANAX) 1 MG tablet, Take 1/2 to one tablet at bedtime as needed for insomnia and restless legs., Disp: , Rfl:  .  B Complex Vitamins (VITAMIN B COMPLEX) TABS, Take 1 tablet by mouth daily. , Disp: , Rfl:  .  Calcium Carbonate-Vitamin D (CALTRATE COLON HEALTH PO), Take 1 tablet by mouth daily., Disp: , Rfl:  .  clonazePAM (KLONOPIN) 0.5 MG tablet, Take 0.5 mg by mouth daily as needed (restless legs)., Disp: , Rfl:  .  gabapentin (NEURONTIN) 600 MG tablet, Take 600 mg by mouth at bedtime. , Disp: , Rfl:  .  levothyroxine (SYNTHROID, LEVOTHROID) 75 MCG tablet, Take 75 mcg by mouth daily before breakfast. , Disp: , Rfl:  .  losartan-hydrochlorothiazide (HYZAAR) 100-12.5 MG tablet, Take 1 tablet by mouth daily., Disp: , Rfl:  .  magnesium 30 MG tablet, Take 30 mg by mouth daily. , Disp: , Rfl:  .  Melatonin 3 MG TBDP, Take 3 mg by mouth at bedtime. , Disp: , Rfl:  .  Multiple Vitamin (MULTIVITAMIN WITH MINERALS) TABS tablet, Take 1 tablet by mouth daily., Disp: , Rfl:  .  Potassium (POTASSIMIN) 75 MG TABS, Take 75 mg by mouth daily., Disp: , Rfl:  .  traZODone (DESYREL) 50 MG tablet, Take 50 mg by mouth at bedtime. , Disp: , Rfl:    PHYSICAL EXAM: BP (!) 159/79 (BP Location: Left Arm, Patient Position: Sitting)   Pulse 71   Temp 97.5 F (36.4 C) (Oral)   Resp 18   Ht 5' 4.5" (1.638 m)   Wt 222 lb 11.2 oz (101 kg)   SpO2 98%   BMI 37.64 kg/m  General: Alert, oriented, no acute distress. HEENT: Posterior oropharynx clear, sclera anicteric. Chest: Clear to auscultation bilaterally.   Cardiovascular: Regular rate and rhythm, no murmurs. Abdomen: Obese, soft, nontender.  Normoactive bowel sounds.  No masses or  hepatosplenomegaly appreciated.  Well-healed scars without port site metastasis or hernia. Extremities: Grossly normal range of motion.  Warm, well perfused.  No edema bilaterally. Skin: No rashes or lesions noted. Lymphatics: No cervical, supraclavicular, or inguinal adenopathy. GU: Normal appearing external genitalia without erythema, excoriation, or lesions.  Speculum exam reveals two 1-2cm lesions consistent with cancer recurrence. Biopsy of the mass was obtained with near complete removal of the mass. Hemostasis obtained with direct pressure.  Bimanual exam reveals ~4cm mass at the vaginal apex. Rectovaginal exam confirms the above findings.  20 minutes were spent in direct counseling today.

## 2016-05-15 NOTE — Addendum Note (Signed)
Addended by: Joylene John D on: 05/15/2016 03:50 PM   Modules accepted: Orders

## 2016-05-16 ENCOUNTER — Telehealth: Payer: Self-pay | Admitting: Gynecologic Oncology

## 2016-05-16 NOTE — Telephone Encounter (Signed)
Informed patient of biopsy results.  Advised that if her CT gets approved sooner than the scheduled date, we will move it to a sooner date.  All questions answered.  Advised to call for any needs.

## 2016-05-17 ENCOUNTER — Telehealth: Payer: Self-pay | Admitting: Gynecologic Oncology

## 2016-05-17 NOTE — Telephone Encounter (Signed)
Called and spoke with Maudie Mercury in Path about ordering MSI testing on original path from June.

## 2016-05-17 NOTE — Telephone Encounter (Signed)
Spoke with patient about moving CT scan up to Monday.  Agreeable with the sooner date.  Instructions discussed including when to drink the contrast, etc.  Advised to call for any needs or concerns.

## 2016-05-20 ENCOUNTER — Encounter (HOSPITAL_COMMUNITY): Payer: Self-pay

## 2016-05-20 ENCOUNTER — Other Ambulatory Visit (HOSPITAL_COMMUNITY)
Admission: RE | Admit: 2016-05-20 | Discharge: 2016-05-20 | Disposition: A | Discharge status: Home / Self Care | Payer: Medicare Other | Source: Ambulatory Visit | Attending: Gynecologic Oncology | Admitting: Gynecologic Oncology

## 2016-05-20 ENCOUNTER — Ambulatory Visit (HOSPITAL_COMMUNITY)
Admission: RE | Admit: 2016-05-20 | Discharge: 2016-05-20 | Disposition: A | Discharge status: Home / Self Care | Payer: Medicare Other | Source: Ambulatory Visit | Attending: Gynecologic Oncology | Admitting: Gynecologic Oncology

## 2016-05-20 DIAGNOSIS — I700 Atherosclerosis of aorta: Secondary | ICD-10-CM | POA: Diagnosis not present

## 2016-05-20 DIAGNOSIS — C541 Malignant neoplasm of endometrium: Secondary | ICD-10-CM | POA: Insufficient documentation

## 2016-05-20 MED ORDER — IOPAMIDOL (ISOVUE-300) INJECTION 61%
100.0000 mL | Freq: Once | INTRAVENOUS | Status: AC | PRN
  Administered 2016-05-20: 100 mL via INTRAVENOUS

## 2016-05-21 ENCOUNTER — Telehealth: Payer: Self-pay | Admitting: Gynecologic Oncology

## 2016-05-21 ENCOUNTER — Other Ambulatory Visit: Payer: Self-pay | Admitting: Gynecologic Oncology

## 2016-05-21 DIAGNOSIS — R9389 Abnormal findings on diagnostic imaging of other specified body structures: Secondary | ICD-10-CM

## 2016-05-21 DIAGNOSIS — C541 Malignant neoplasm of endometrium: Secondary | ICD-10-CM

## 2016-05-21 NOTE — Telephone Encounter (Signed)
Called patient and informed her of CT scan results and the need to proceed with a doppler of the LLE to rule out DVT.  Verbalizing understanding.  F/U appt scheduled for Friday.  Advised she would be contacted with recommendations as well from Dr. Carlis Abbott or Denman George.  13:15 Informed patient of doppler tomorrow at [facility] at 1 pm.  Advised to arrive at admissions at 12:30pm at [facility].  Advised to call for any needs.

## 2016-05-22 ENCOUNTER — Ambulatory Visit (HOSPITAL_COMMUNITY): Payer: Medicare Other

## 2016-05-22 ENCOUNTER — Telehealth: Payer: Self-pay | Admitting: Gynecologic Oncology

## 2016-05-22 ENCOUNTER — Ambulatory Visit (HOSPITAL_COMMUNITY)
Admission: RE | Admit: 2016-05-22 | Discharge: 2016-05-22 | Disposition: A | Discharge status: Home / Self Care | Payer: Medicare Other | Source: Ambulatory Visit | Attending: Gynecologic Oncology | Admitting: Gynecologic Oncology

## 2016-05-22 DIAGNOSIS — R938 Abnormal findings on diagnostic imaging of other specified body structures: Secondary | ICD-10-CM | POA: Insufficient documentation

## 2016-05-22 DIAGNOSIS — C541 Malignant neoplasm of endometrium: Secondary | ICD-10-CM

## 2016-05-22 DIAGNOSIS — R9389 Abnormal findings on diagnostic imaging of other specified body structures: Secondary | ICD-10-CM

## 2016-05-22 NOTE — Telephone Encounter (Signed)
Called and discussed Dr. Serita Grit plan for treatment.  Advised patient that it would be discussed with her in more detail on Friday at her appt with Dr. Fermin Schwab.  Per Dr. Denman George: EB RT plus vaginal brachytherapy with CDDP on day 1 and 28 (50mg /ml) followed by 6 cycles of carboplatin and taxol.

## 2016-05-22 NOTE — Progress Notes (Signed)
**  Preliminary report by tech**  Left lower extremity venous duplex complete. There is no evidence of deep or superficial vein thrombosis involving the left lower extremity. All visualized vessels appear patent and compressible. There is no evidence of a Baker's cyst on the left. Incidental finding consistent with an enlarged lymph node in the left groin. Results were given to [facility] at [phone].  05/22/16 1:39 PM Michelle Rosario RVT

## 2016-05-24 ENCOUNTER — Telehealth: Payer: Self-pay | Admitting: Gynecologic Oncology

## 2016-05-24 ENCOUNTER — Ambulatory Visit: Payer: Medicare Other | Attending: Gynecology | Admitting: Gynecology

## 2016-05-24 VITALS — BP 128/77 | HR 77 | Temp 97.5°F | Resp 20 | Ht 65.4 in | Wt 219.5 lb

## 2016-05-24 DIAGNOSIS — Z5189 Encounter for other specified aftercare: Secondary | ICD-10-CM | POA: Diagnosis present

## 2016-05-24 DIAGNOSIS — Z79899 Other long term (current) drug therapy: Secondary | ICD-10-CM | POA: Diagnosis not present

## 2016-05-24 DIAGNOSIS — Z9889 Other specified postprocedural states: Secondary | ICD-10-CM | POA: Insufficient documentation

## 2016-05-24 DIAGNOSIS — Z9851 Tubal ligation status: Secondary | ICD-10-CM | POA: Diagnosis not present

## 2016-05-24 DIAGNOSIS — Z808 Family history of malignant neoplasm of other organs or systems: Secondary | ICD-10-CM | POA: Insufficient documentation

## 2016-05-24 DIAGNOSIS — C541 Malignant neoplasm of endometrium: Secondary | ICD-10-CM | POA: Insufficient documentation

## 2016-05-24 NOTE — Progress Notes (Signed)
Consult Note: Gyn-Onc   Michelle Rosario 70 y.o. female  Chief Complaint  Patient presents with  . 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 Given the recurrence predominantly focused in the pelvis, we would recommend whole pelvis radiation therapy with conquer and cisplatin 50 mg/m on days 1 and 28. Following completion of radiation therapy would recommend vaginal vault brachytherapy. Given the high risk of occult distant metastases (despite a normal CT scan) we would recommend an additional  6 cycles of carboplatin and Taxol.  We will make a referral to radiation therapy and medical oncology to initiate treatment. The patient and her sister aware that Port-A-Cath would be recommended as well.  Interval History: The patient returns today with her sister to discuss treatment planning. At her last visit she was found to have a vaginal vault recurrence. CT scan of the chest, abdomen and pelvis showed a 2.5 cm pelvic lesion. There is no other evidence of distant disease. There is also some question of a venous thrombosis but follow-up ultrasound has been negative.  The patient is having minimal spotting at the present time.  HPI: The patient is a 70 year old G1P1 who was initially seen in consultation at the request of Dr Pamala Hurry for grade 2 endometrial cancer. A transvaginal ultrasound scan was performed on 09/11/2015. It revealed a uterus measuring 7.2 x 6 x 4.5 cm with a thickened endometrial stripe of 29 mm. The ovaries were normal bilaterally. 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 Her father has a history of bladder cancer that he was a smoker in his youth. Her mother had a diagnosis of early stage  uterine cancer at advanced age.  On 01/02/16 she underwent robotic assisted total hysterectomy, BSO, SLN biopsy. Surgery was uneventful. Postoperative pathology revealed: a 4.7cm grade 2 tumor with 4 mm of 2.5cm myometrial invasion. No LVSI, no cervical or adnexal involvement. The SLN's were negative.   The patient presented for follow-up with abnormal bleeding on 05/15/2016. She was found to have a vaginal ball recurrence. CT scan showed a pelvic mass measuring 2.5 cm in diameter. No other evidence of extrapelvic disease was identified.   Review of Systems:10 point review of systems is negative except as noted in interval history.   Vitals: Blood pressure 128/77, pulse 77, temperature 97.5 F (36.4 C), temperature source Oral, resp. rate 20, height 5' 5.4" (1.661 m), weight 219 lb 8 oz (99.6 kg), SpO2 97 %.  Physical Exam: General : The patient is a healthy woman in no acute distress.  HEENT: normocephalic, extraoccular movements normal; neck is supple without thyromegally         Allergies  Allergen Reactions  . Other Itching and Rash    Contact dermatitis noted on [facility] notes  Patient denies; does not recall    Past Medical History:  Diagnosis Date  . Broken ribs    hx of  . Family history of adverse reaction to anesthesia    mother post op N&V  . Hypertension   . Hypothyroidism   . Uterine cancer [facility])    May 30 , 2017    Past Surgical History:  Procedure Laterality Date  . achellis tendon repaired in 2005    . ANKLE SURGERY Right Oct  2006  . fisure  2014  . LYMPH NODE BIOPSY N/A 01/02/2016   Procedure: SENTINAL LYMPH NODE BIOPSY;  Surgeon: Everitt Amber, MD;  Location: [facility];  Service: Gynecology;  Laterality: N/A;  . rectal fissure surgery 2010    . REPLACEMENT TOTAL KNEE Left 2015  . ROBOTIC ASSISTED TOTAL HYSTERECTOMY WITH BILATERAL SALPINGO OOPHERECTOMY Bilateral 01/02/2016   Procedure: XI ROBOTIC ASSISTED TOTAL LAPAROSCOPIC HYSTERECTOMY WITH BILATERAL  SALPINGO OOPHORECTOMY;  Surgeon: Everitt Amber, MD;  Location: [facility];  Service: Gynecology;  Laterality: Bilateral;  . TUBAL LIGATION      Current Outpatient Prescriptions  Medication Sig Dispense Refill  . ALPRAZolam (XANAX) 1 MG tablet Take 1/2 to one tablet at bedtime as needed for insomnia and restless legs.    Marland Kitchen amoxicillin (AMOXIL) 500 MG tablet TAKE 4 TABLETS BY MOUTH 1 HOUR PRIOR TO PROCEDURE.  2  . B Complex Vitamins (VITAMIN B COMPLEX) TABS Take 1 tablet by mouth daily.     . Calcium Carbonate-Vitamin D (CALTRATE COLON HEALTH PO) Take 1 tablet by mouth daily.    . clonazePAM (KLONOPIN) 0.5 MG tablet Take 0.5 mg by mouth daily as needed (restless legs).    . gabapentin (NEURONTIN) 600 MG tablet Take 600 mg by mouth at bedtime.     Marland Kitchen levothyroxine (SYNTHROID, LEVOTHROID) 75 MCG tablet Take 75 mcg by mouth daily before breakfast.     . losartan-hydrochlorothiazide (HYZAAR) 100-12.5 MG tablet Take 1 tablet by mouth daily.    . magnesium 30 MG tablet Take 30 mg by mouth daily.     . Melatonin 3 MG TBDP Take 3 mg by mouth at bedtime.     . Multiple Vitamin (MULTIVITAMIN WITH MINERALS) TABS tablet Take 1 tablet by mouth daily.    . Potassium (POTASSIMIN) 75 MG TABS Take 75 mg by mouth daily.    . traZODone (DESYREL) 50 MG tablet Take 50 mg by mouth at bedtime.      No current facility-administered medications for this visit.     Social History   Social History  . Marital status: Divorced    Spouse name: N/A  . Number of children: N/A  . Years of education: N/A   Occupational History  . Not on file.   Social History Main Topics  . Smoking status: Never Smoker  . Smokeless tobacco: Never Used  . Alcohol use Yes     Comment: socially  . Drug use: No  . Sexual activity: Not on file   Other Topics Concern  . Not on file   Social History Narrative  . No narrative on file    Family History  Problem Relation Age of Onset  . Anesthesia problems Mother   . Cancer Mother      uterine cancer  . Cancer Father   . Diabetes Sister       Marti Sleigh, MD 05/24/2016, 9:37 AM

## 2016-05-24 NOTE — Telephone Encounter (Signed)
Called and spoke with patient about appointments on Monday with Dr. Marko Plume.  Confirmed appts and no concerns voiced.

## 2016-05-24 NOTE — Patient Instructions (Addendum)
Plan to meet with Dr. Gery Pray in Radiation Oncology to discuss radiation and Dr. Evlyn Clines in Medical Oncology.  Your appointment with Dr. Gery Pray will be on June 05, 2016 at 8am.  Please call for any questions or concerns. You can fax your FMLA forms to [phone] attention Lashia Niese.  Plan is for external beam radiation plus vaginal brachytherapy (treatment at the top of the vagina) with cisplatin on day 1 and 28 (50mg /ml) followed by 6 cycles of carboplatin and taxol chemotherapy.

## 2016-05-26 ENCOUNTER — Other Ambulatory Visit: Payer: Self-pay | Admitting: Oncology

## 2016-05-26 DIAGNOSIS — C541 Malignant neoplasm of endometrium: Secondary | ICD-10-CM

## 2016-05-27 ENCOUNTER — Other Ambulatory Visit: Payer: Medicare Other

## 2016-05-27 ENCOUNTER — Other Ambulatory Visit (HOSPITAL_BASED_OUTPATIENT_CLINIC_OR_DEPARTMENT_OTHER): Payer: Medicare Other

## 2016-05-27 ENCOUNTER — Ambulatory Visit (HOSPITAL_BASED_OUTPATIENT_CLINIC_OR_DEPARTMENT_OTHER): Payer: Medicare Other | Admitting: Oncology

## 2016-05-27 VITALS — BP 150/95 | HR 66 | Temp 97.6°F | Resp 20 | Ht 63.5 in | Wt 218.4 lb

## 2016-05-27 DIAGNOSIS — C541 Malignant neoplasm of endometrium: Secondary | ICD-10-CM

## 2016-05-27 DIAGNOSIS — I700 Atherosclerosis of aorta: Secondary | ICD-10-CM

## 2016-05-27 DIAGNOSIS — E039 Hypothyroidism, unspecified: Secondary | ICD-10-CM

## 2016-05-27 DIAGNOSIS — I10 Essential (primary) hypertension: Secondary | ICD-10-CM

## 2016-05-27 LAB — COMPREHENSIVE METABOLIC PANEL
ALBUMIN: 3.4 g/dL — AB (ref 3.5–5.0)
ALK PHOS: 138 U/L (ref 40–150)
ALT: 12 U/L (ref 0–55)
AST: 13 U/L (ref 5–34)
Anion Gap: 8 mEq/L (ref 3–11)
BILIRUBIN TOTAL: 0.49 mg/dL (ref 0.20–1.20)
BUN: 19.1 mg/dL (ref 7.0–26.0)
CALCIUM: 9.9 mg/dL (ref 8.4–10.4)
CO2: 28 mEq/L (ref 22–29)
CREATININE: 0.7 mg/dL (ref 0.6–1.1)
Chloride: 105 mEq/L (ref 98–109)
EGFR: 83 mL/min/{1.73_m2} — ABNORMAL LOW (ref >90)
GLUCOSE: 93 mg/dL (ref 70–140)
Potassium: 4.5 mEq/L (ref 3.5–5.1)
SODIUM: 141 meq/L (ref 136–145)
TOTAL PROTEIN: 7.5 g/dL (ref 6.4–8.3)

## 2016-05-27 LAB — CBC WITH DIFFERENTIAL/PLATELET
BASO%: 0.8 % (ref 0.0–2.0)
BASOS ABS: 0.1 10*3/uL (ref 0.0–0.1)
EOS ABS: 0.2 10*3/uL (ref 0.0–0.5)
EOS%: 2.3 % (ref 0.0–7.0)
HEMATOCRIT: 40.6 % (ref 34.8–46.6)
HEMOGLOBIN: 13.4 g/dL (ref 11.6–15.9)
LYMPH#: 2.3 10*3/uL (ref 0.9–3.3)
LYMPH%: 25 % (ref 14.0–49.7)
MCH: 26.5 pg (ref 25.1–34.0)
MCHC: 33 g/dL (ref 31.5–36.0)
MCV: 80.2 fL (ref 79.5–101.0)
MONO#: 0.6 10*3/uL (ref 0.1–0.9)
MONO%: 6.3 % (ref 0.0–14.0)
NEUT%: 65.6 % (ref 38.4–76.8)
NEUTROS ABS: 5.9 10*3/uL (ref 1.5–6.5)
Platelets: 239 10*3/uL (ref 145–400)
RBC: 5.06 10*6/uL (ref 3.70–5.45)
RDW: 13 % (ref 11.2–14.5)
WBC: 9 10*3/uL (ref 3.9–10.3)

## 2016-05-27 LAB — MAGNESIUM: Magnesium: 1.9 mg/dl (ref 1.5–2.5)

## 2016-05-27 MED ORDER — ONDANSETRON HCL 8 MG PO TABS: 8.0000 mg | ORAL_TABLET | Freq: Three times a day (TID) | ORAL | 0 refills | Status: DC | PRN

## 2016-05-27 MED ORDER — PROCHLORPERAZINE MALEATE 10 MG PO TABS: 10.0000 mg | ORAL_TABLET | Freq: Four times a day (QID) | ORAL | 0 refills | Status: DC | PRN
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
## 2016-05-29 ENCOUNTER — Encounter: Payer: Self-pay | Admitting: Oncology

## 2016-05-29 DIAGNOSIS — I700 Atherosclerosis of aorta: Secondary | ICD-10-CM | POA: Insufficient documentation

## 2016-05-29 DIAGNOSIS — E039 Hypothyroidism, unspecified: Secondary | ICD-10-CM | POA: Insufficient documentation

## 2016-05-29 DIAGNOSIS — I10 Essential (primary) hypertension: Secondary | ICD-10-CM | POA: Insufficient documentation

## 2016-06-03 ENCOUNTER — Other Ambulatory Visit: Payer: Self-pay | Admitting: Gynecologic Oncology

## 2016-06-03 ENCOUNTER — Encounter: Payer: Self-pay | Admitting: Gynecologic Oncology

## 2016-06-03 ENCOUNTER — Telehealth: Payer: Self-pay | Admitting: Gynecologic Oncology

## 2016-06-03 DIAGNOSIS — C541 Malignant neoplasm of endometrium: Secondary | ICD-10-CM

## 2016-06-03 NOTE — Progress Notes (Addendum)
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 She was found to have a vaginal recurrence. CT scan showed a pelvic mass measuring 2.5 cm in diameter.    Biopsies revealed:   05/15/16 Diagnosis Vagina, biopsy, mid - ADENOCARCINOMA, SEE COMMENT.  01/02/16 Diagnosis 1. 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 Lymph node, sentinel, biopsy, right obturator ONE BENIGN LYMPH NODE (0/1) 3. Lymph nodes, regional resection, left pelvic FOUR BENIGN LYMPH NODES (0/4)  Past/Anticipated interventions by Gyn/Onc surgery, if any: 01/02/16 - Procedure: XI ROBOTIC ASSISTED TOTAL LAPAROSCOPIC HYSTERECTOMY WITH BILATERAL SALPINGO OOPHORECTOMY;  Surgeon: Everitt Amber, MD    Past/Anticipated interventions by medical oncology, if any: Chemotherapy (sensitizing CDDP day 1 day 8 with radiation, then 6 cycles carbo taxol)   Weight changes, if any: no  Bowel/Bladder complaints, if any: no  Nausea/Vomiting, if any: no  Pain issues, if any:  no  SAFETY ISSUES:  Prior radiation? no  Pacemaker/ICD? no  Possible current pregnancy? no  Is the patient on methotrexate? no  Current Complaints / other details:  Patient reports having pink vaginal bleeding that started about 2 weeks ago.  She will have a PET scan 06/12/16.  BP (!) 138/91 (BP Location: Left Arm, Patient Position: Sitting)   Pulse 73   Temp 97.8 F (36.6 C) (Oral)   Ht 5' 3.5" (1.613 m)   Wt 220 lb 12.8 oz (100.2 kg)   SpO2 99%   BMI 38.50 kg/m    Wt Readings from Last 3 Encounters:  06/05/16 220 lb 12.8 oz (100.2 kg)  05/27/16 218 lb 6.4 oz (99.1 kg)   05/15/16 222 lb 11.2 oz (101 kg)

## 2016-06-03 NOTE — Progress Notes (Signed)
Gynecologic Oncology Multi-Disciplinary Disposition Conference Note  Date of the Conference: June 03, 2016  Patient Name: Michelle Rosario  Referring Provider: Dr. Pamala Hurry Primary GYN Oncologist: Dr. 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  Disposition is to external beam radiation therapy plus vaginal brachytherapy with CDDP on day 1 and 28 (50 mg/ml) followed by 6 cycles of carboplatin and taxol.  PET scan prior to beginning radiation.   This Multidisciplinary conference took place involving physicians from [place], [place], Radiation Oncology, Pathology, Radiology along with the Gynecologic Oncology Nurse Practitioner and RN.  Comprehensive assessment of the patient's malignancy, staging, need for surgery, chemotherapy, radiation therapy, and need for further testing were reviewed. Supportive measures, both inpatient and following discharge were also discussed. The recommended plan of care is documented. Greater than 35 minutes were spent correlating and coordinating this patient's care.

## 2016-06-03 NOTE — Telephone Encounter (Signed)
Left message asking patient to please call the office so I could discuss recommendations discussed at the Tumor Conference this am (PET scan).

## 2016-06-04 ENCOUNTER — Telehealth: Payer: Self-pay | Admitting: Gynecologic Oncology

## 2016-06-04 NOTE — Telephone Encounter (Signed)
Spoke with the patient about the recommendation for proceeding with a PET scan prior to beginning radiation from our Tumor Conference yesterday.  Appt date and time given.  She is advised to continue with appt with [facility].  Advised to call for any needs or concerns.

## 2016-06-05 ENCOUNTER — Ambulatory Visit
Admission: RE | Admit: 2016-06-05 | Discharge: 2016-06-05 | Disposition: A | Discharge status: Home / Self Care | Payer: Medicare Other | Source: Ambulatory Visit | Attending: Radiation Oncology | Admitting: Radiation Oncology

## 2016-06-05 ENCOUNTER — Encounter: Payer: Self-pay | Admitting: Radiation Oncology

## 2016-06-05 VITALS — BP 138/91 | HR 73 | Temp 97.8°F | Ht 63.5 in | Wt 220.8 lb

## 2016-06-05 DIAGNOSIS — C541 Malignant neoplasm of endometrium: Secondary | ICD-10-CM | POA: Insufficient documentation

## 2016-06-05 DIAGNOSIS — Z510 Encounter for antineoplastic radiation therapy: Secondary | ICD-10-CM | POA: Diagnosis present

## 2016-06-05 DIAGNOSIS — Z79899 Other long term (current) drug therapy: Secondary | ICD-10-CM | POA: Diagnosis not present

## 2016-06-05 DIAGNOSIS — Z9889 Other specified postprocedural states: Secondary | ICD-10-CM | POA: Insufficient documentation

## 2016-06-05 DIAGNOSIS — Z9221 Personal history of antineoplastic chemotherapy: Secondary | ICD-10-CM | POA: Diagnosis not present

## 2016-06-05 NOTE — Progress Notes (Signed)
Radiation Oncology         [phone] ________________________________  Initial Outpatient Consultation  Name: Michelle Rosario MRN: [id number]  Date: 06/05/2016  DOB: 11/03/1945  [id number] Mallie Mussel, MD  Fermin Schwab [facility]   REFERRING PHYSICIAN: Marti Sleigh  DIAGNOSIS: FIGO stage IA (pT1a, pN0) grade 2 endometrial adenocarcinoma, now with vaginal and pelvic recurrence  HISTORY OF PRESENT ILLNESS::Michelle Rosario is a 70 y.o. female who reported a 1 month history of postmenopausal bleeding. A transvaginal ultrasound scan was performed on 09/11/2015 and it revealed a uterus measuring 7.2 x 6 x 4.5 cm with a thickened endometrial stripe of 29 mm and the ovaries were normal bilaterally. 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  The patient saw Dr. Denman George on 12/20/15 who recommended surgery. On 01/02/16, the patient underwent a robotic assisted total hysterectomy, BSO, and SLN biopsy on 01/02/16. This revealed FIGO grade 2 (4.7 cm) endometrial adenomarcinoma, the tumor invades less than 1/2 of the myometrium (0.4 cm where the myometrium was 2 cm in thickness), all margins of the resection were negative for carcinoma, the bilateral fallopian tubes and ovaries were unremarkable, a right obturator sentinel lymph node was negative, and 4 left pelvic sentinel lymph node were negative.  The patient returned to Dr. Denman George on 01/25/16 who discussed the pathology, low risk factors for recurrence, and did not recommend adjuvant therapy according to the NCCN guidelines.  On 05/15/16, the patient saw Dr. Carlis Abbott for vaginal bleeding. Speculum exam at that time revealed two 1 -2 cm lesions consistent with recurrent cancer. Bimanual exam revealed a ~4 cm mass at the vaginal apex and rectovaginal exam confirmed the findings. Biopsy was consistent with recurrent endometrioid adenocarcinoma with similar appearance to the  primary.  CT of the chest/abd/pelvis on 05/20/16 revealed a 2.5 x 2.1 cm solid peritoneal mass in the mid to anterior left pelvis suspicious for peritoneal metastasis.  The patient saw Dr. Fermin Schwab on 05/24/16 who discussed systemic therapy and radiation therapy. The patient was then referred to Dr. Marko Plume on 05/27/16 who recommended radiosensitizing CDDP 50mg /m2 on days 1 and 28 and then 6 cycles of Carboplatin and Taxol. The patient presents today to discuss the role of radiotherapy for the management of her disease.  PREVIOUS RADIATION THERAPY: No  PAST MEDICAL HISTORY:  has a past medical history of Broken ribs; Family history of adverse reaction to anesthesia; Hypertension; Hypothyroidism; and Uterine cancer ([employer]).    PAST SURGICAL HISTORY: Past Surgical History:  Procedure Laterality Date  . achellis tendon repaired in 2005    . ANKLE SURGERY Right Oct 2006  . fisure  2014  . LYMPH NODE BIOPSY N/A 01/02/2016   Procedure: SENTINAL LYMPH NODE BIOPSY;  Surgeon: Everitt Amber, MD;  Location: [facility];  Service: Gynecology;  Laterality: N/A;  . rectal fissure surgery 2010    . REPLACEMENT TOTAL KNEE Left 2015  . ROBOTIC ASSISTED TOTAL HYSTERECTOMY WITH BILATERAL SALPINGO OOPHERECTOMY Bilateral 01/02/2016   Procedure: XI ROBOTIC ASSISTED TOTAL LAPAROSCOPIC HYSTERECTOMY WITH BILATERAL SALPINGO OOPHORECTOMY;  Surgeon: Everitt Amber, MD;  Location: [facility];  Service: Gynecology;  Laterality: Bilateral;  . TUBAL LIGATION      FAMILY HISTORY: family history includes Anesthesia problems in her mother; Cancer in her father and mother; Diabetes in her sister.  SOCIAL HISTORY:  reports that she has never smoked. She has never used smokeless tobacco. She reports that she drinks alcohol. She reports that she does not use drugs.  ALLERGIES: Latex  MEDICATIONS:  Current Outpatient Prescriptions  Medication Sig Dispense Refill  . ALPRAZolam (XANAX) 1 MG tablet Take 1/2 to one tablet at bedtime as  needed for insomnia and restless legs.    . B Complex Vitamins (VITAMIN B COMPLEX) TABS Take 1 tablet by mouth daily.     . Calcium Carbonate-Vitamin D (CALTRATE COLON HEALTH PO) Take 1 tablet by mouth daily.    . clonazePAM (KLONOPIN) 0.5 MG tablet Take 0.5 mg by mouth daily as needed (restless legs).    . gabapentin (NEURONTIN) 600 MG tablet Take 600 mg by mouth at bedtime.     Marland Kitchen levothyroxine (SYNTHROID, LEVOTHROID) 75 MCG tablet Take 75 mcg by mouth daily before breakfast.     . losartan-hydrochlorothiazide (HYZAAR) 100-12.5 MG tablet Take 1 tablet by mouth daily.    . magnesium 30 MG tablet Take 30 mg by mouth daily.     . Melatonin 3 MG TBDP Take 3 mg by mouth at bedtime.     . Multiple Vitamin (MULTIVITAMIN WITH MINERALS) TABS tablet Take 1 tablet by mouth daily.    . Potassium (POTASSIMIN) 75 MG TABS Take 75 mg by mouth daily.    . traZODone (DESYREL) 50 MG tablet Take 50 mg by mouth at bedtime.     Marland Kitchen amoxicillin (AMOXIL) 500 MG tablet TAKE 4 TABLETS BY MOUTH 1 HOUR PRIOR TO PROCEDURE.  2  . ondansetron (ZOFRAN) 8 MG tablet Take 1 tablet (8 mg total) by mouth every 8 (eight) hours as needed for nausea. (Patient not taking: Reported on 06/05/2016) 30 tablet 0  . prochlorperazine (COMPAZINE) 10 MG tablet Take 1 tablet (10 mg total) by mouth every 6 (six) hours as needed for nausea. (Patient not taking: Reported on 06/05/2016) 20 tablet 0   No current facility-administered medications for this encounter.     REVIEW OF SYSTEMS:  A 15 point review of systems is documented in the electronic medical record. This was obtained by the nursing staff. However, I reviewed this with the patient to discuss relevant findings and make appropriate changes.  Pertinent items are noted in HPI. Pertinent items noted in HPI and remainder of comprehensive ROS otherwise negative.   The patient denies weight changes, bowel/bladder complaints, nausea/vomiting, or pain. The patient denies vaginal bleeding at this  time.   PHYSICAL EXAM:  height is 5' 3.5" (1.613 m) and weight is 220 lb 12.8 oz (100.2 kg). Her oral temperature is 97.8 F (36.6 C). Her blood pressure is 138/91 (abnormal) and her pulse is 73. Her oxygen saturation is 99%.   General: Alert and oriented, in no acute distress HEENT: Head is normocephalic. Extraocular movements are intact. Oropharynx is clear. Neck: Neck is supple, no palpable cervical or supraclavicular lymphadenopathy. Heart: Regular in rate and rhythm with no murmurs, rubs, or gallops. Chest: Clear to auscultation bilaterally, with no rhonchi, wheezes, or rales. Abdomen: Soft, nontender, nondistended, with no rigidity or guarding. Extremities: No cyanosis or edema. Lymphatics: see Neck Exam Skin: No concerning lesions. Musculoskeletal: symmetric strength and muscle tone throughout. Neurologic: Cranial nerves II through XII are grossly intact. No obvious focalities. Speech is fluent. Coordination is intact. Psychiatric: Judgment and insight are intact. Affect is appropriate. Pelvic Exam: On pelvic examination the external genitalia were unremarkable. A speculum exam was performed. There is a polypoid erythematous lesion along the right anterior vaginal vault at approximately the 11:00 position and approximately 3 cm from the vaginal cuff. There were no other lesions noted in the vaginal  vault. This lesion tends to bleed with manipulation. On bimanual and rectovaginal examination there were no other pelvic masses appreciated. The lesion is palpable on digital exam. Rectal sphincter tone is normal. Unable to see a second lesion which may have been removed at the time of her biopsy.  ECOG = 1  LABORATORY DATA:  Lab Results  Component Value Date   WBC 9.0 05/27/2016   HGB 13.4 05/27/2016   HCT 40.6 05/27/2016   MCV 80.2 05/27/2016   PLT 239 05/27/2016   NEUTROABS 5.9 05/27/2016   Lab Results  Component Value Date   NA 141 05/27/2016   K 4.5 05/27/2016   CL 103  01/03/2016   CO2 28 05/27/2016   GLUCOSE 93 05/27/2016   CREATININE 0.7 05/27/2016   CALCIUM 9.9 05/27/2016      RADIOGRAPHY: Ct Chest W Contrast  Result Date: 05/21/2016 CLINICAL DATA:  70 year old female with a history of endometrial cancer status post TAH-BSO 01/02/2016, with clinical concern for recurrent neoplasm with reported biopsy of vaginal lesions and mass palpated on exam. EXAM: CT CHEST, ABDOMEN, AND PELVIS WITH CONTRAST TECHNIQUE: Multidetector CT imaging of the chest, abdomen and pelvis was performed following the standard protocol during bolus administration of intravenous contrast. CONTRAST:  148mL ISOVUE-300 IOPAMIDOL (ISOVUE-300) INJECTION 61% COMPARISON:  05/18/2004 chest CT. FINDINGS: CT CHEST FINDINGS Cardiovascular: Normal heart size. No significant pericardial fluid/thickening. Atherosclerotic nonaneurysmal thoracic aorta. Normal caliber pulmonary arteries. No central pulmonary emboli. Mediastinum/Nodes: No discrete thyroid nodules. Unremarkable esophagus. No pathologically enlarged axillary, mediastinal or hilar lymph nodes. Lungs/Pleura: No pneumothorax. No pleural effusion. Right upper lobe 3 mm solid pulmonary nodule (series 4/ image 40) is stable since 05/18/2004 and considered benign. No acute consolidative airspace disease, lung masses or additional significant pulmonary nodules. Musculoskeletal: No aggressive appearing focal osseous lesions. Mild-to-moderate thoracic spondylosis. CT ABDOMEN PELVIS FINDINGS Hepatobiliary: Normal liver size. Solitary hypodense 1.0 cm superior right liver lobe lesion (series 2/ image 47), stable in size since 05/18/2004, consistent with a benign lesion. No additional liver lesions. Normal gallbladder with no radiopaque cholelithiasis. No biliary ductal dilatation. Pancreas: Normal, with no mass or duct dilation. Spleen: Normal size. No mass. Adrenals/Urinary Tract: Normal adrenals. No hydronephrosis. No renal mass. Normal bladder.  Stomach/Bowel: Grossly normal stomach. Normal caliber small bowel with no small bowel wall thickening. Normal appendix. Normal large bowel with no diverticulosis, large bowel wall thickening or pericolonic fat stranding. Vascular/Lymphatic: Normal caliber abdominal aorta. Patent portal, splenic and renal veins. No pathologically enlarged lymph nodes in the abdomen or pelvis. Reproductive: Status post hysterectomy and bilateral oophorectomy. There is a solid 2.5 x 2.1 cm left mid to anterior pelvic mass (series 2/ image 104), located superior to the left bladder wall. No additional solid peritoneal cavity implants. There is a 1.7 x 1.5 cm simple fluid density structure in the left pelvic sidewall abutting the left external iliac vessels (series 2/image 103). There is a expansile 1.6 x 1.1 cm low-attenuation filling defect in the left external iliac vein (series 6/image 109). Other: No pneumoperitoneum. No ascites. Musculoskeletal: No aggressive appearing focal osseous lesions. Moderate to marked lumbar spondylosis. IMPRESSION: 1. Solid 2.5 cm peritoneal mass in the mid to anterior left pelvis, suspicious for peritoneal metastasis. 2. Small expansile low-attenuation filling defect in the left external iliac vein, cannot exclude a small deep venous thrombus. Consider correlation with left lower extremity venous Doppler scan. 3. Small simple fluid density structure in the left pelvic sidewall abutting the left external iliac vessels, favor a small  postoperative seroma. 4. No ascites. 5. No lymphadenopathy.  No metastatic disease in the chest. 6. Aortic atherosclerosis. These results will be called to the ordering clinician or representative by the Radiologist Assistant, and communication documented in the PACS or zVision Dashboard. Electronically Signed   By: Ilona Sorrel M.D.   On: 05/21/2016 08:48   Ct Abdomen Pelvis W Contrast  Result Date: 05/21/2016 CLINICAL DATA:  70 year old female with a history of  endometrial cancer status post TAH-BSO 01/02/2016, with clinical concern for recurrent neoplasm with reported biopsy of vaginal lesions and mass palpated on exam. EXAM: CT CHEST, ABDOMEN, AND PELVIS WITH CONTRAST TECHNIQUE: Multidetector CT imaging of the chest, abdomen and pelvis was performed following the standard protocol during bolus administration of intravenous contrast. CONTRAST:  167mL ISOVUE-300 IOPAMIDOL (ISOVUE-300) INJECTION 61% COMPARISON:  05/18/2004 chest CT. FINDINGS: CT CHEST FINDINGS Cardiovascular: Normal heart size. No significant pericardial fluid/thickening. Atherosclerotic nonaneurysmal thoracic aorta. Normal caliber pulmonary arteries. No central pulmonary emboli. Mediastinum/Nodes: No discrete thyroid nodules. Unremarkable esophagus. No pathologically enlarged axillary, mediastinal or hilar lymph nodes. Lungs/Pleura: No pneumothorax. No pleural effusion. Right upper lobe 3 mm solid pulmonary nodule (series 4/ image 40) is stable since 05/18/2004 and considered benign. No acute consolidative airspace disease, lung masses or additional significant pulmonary nodules. Musculoskeletal: No aggressive appearing focal osseous lesions. Mild-to-moderate thoracic spondylosis. CT ABDOMEN PELVIS FINDINGS Hepatobiliary: Normal liver size. Solitary hypodense 1.0 cm superior right liver lobe lesion (series 2/ image 47), stable in size since 05/18/2004, consistent with a benign lesion. No additional liver lesions. Normal gallbladder with no radiopaque cholelithiasis. No biliary ductal dilatation. Pancreas: Normal, with no mass or duct dilation. Spleen: Normal size. No mass. Adrenals/Urinary Tract: Normal adrenals. No hydronephrosis. No renal mass. Normal bladder. Stomach/Bowel: Grossly normal stomach. Normal caliber small bowel with no small bowel wall thickening. Normal appendix. Normal large bowel with no diverticulosis, large bowel wall thickening or pericolonic fat stranding. Vascular/Lymphatic: Normal  caliber abdominal aorta. Patent portal, splenic and renal veins. No pathologically enlarged lymph nodes in the abdomen or pelvis. Reproductive: Status post hysterectomy and bilateral oophorectomy. There is a solid 2.5 x 2.1 cm left mid to anterior pelvic mass (series 2/ image 104), located superior to the left bladder wall. No additional solid peritoneal cavity implants. There is a 1.7 x 1.5 cm simple fluid density structure in the left pelvic sidewall abutting the left external iliac vessels (series 2/image 103). There is a expansile 1.6 x 1.1 cm low-attenuation filling defect in the left external iliac vein (series 6/image 109). Other: No pneumoperitoneum. No ascites. Musculoskeletal: No aggressive appearing focal osseous lesions. Moderate to marked lumbar spondylosis. IMPRESSION: 1. Solid 2.5 cm peritoneal mass in the mid to anterior left pelvis, suspicious for peritoneal metastasis. 2. Small expansile low-attenuation filling defect in the left external iliac vein, cannot exclude a small deep venous thrombus. Consider correlation with left lower extremity venous Doppler scan. 3. Small simple fluid density structure in the left pelvic sidewall abutting the left external iliac vessels, favor a small postoperative seroma. 4. No ascites. 5. No lymphadenopathy.  No metastatic disease in the chest. 6. Aortic atherosclerosis. These results will be called to the ordering clinician or representative by the Radiologist Assistant, and communication documented in the PACS or zVision Dashboard. Electronically Signed   By: Ilona Sorrel M.D.   On: 05/21/2016 08:48      IMPRESSION: FIGO stage IA (pT1a, pN0) grade 2 endometrial adenocarcinoma now with vaginal and pelvic recurrence  Today, I spoke with the  patient about the findings and work-up thus far.  We discussed the natural history of recurrent endometrioid adenocarcinoma and general treatment, highlighting the role of radiotherapy in the management.  We discussed the  available radiation techniques, and focused on the details of logistics and delivery.  We reviewed the anticipated acute and late sequelae associated with radiation in this setting.  We discussed IMRT pelvic radiation treatments to limit dose to the small bowel and bone marrow and brachytherapy to the vaginal vault. The patient was encouraged to ask questions that I answered to the best of my ability.  The patient signed a consent form and  we retained a copy for our records.  The patient would like to proceed with radiation and has been scheduled for CT simulation.  PLAN: The patient is scheduled for a PET scan on 06/12/16, for further staging workup, and CT simulation on 06/13/16. She will likely begin radiation treatment on 06/24/16 with radiosensitizing chemotherapy.      ------------------------------------------------  Blair Promise, PhD, MD  This document serves as a record of services personally performed by Gery Pray, MD. It was created on his behalf by Darcus Austin, a trained medical scribe. The creation of this record is based on the scribe's personal observations and the provider's statements to them. This document has been checked and approved by the attending provider.

## 2016-06-05 NOTE — Progress Notes (Signed)
Please see the Nurse Progress Note in the MD Initial Consult Encounter for this patient. 

## 2016-06-10 ENCOUNTER — Other Ambulatory Visit: Payer: Self-pay | Admitting: Oncology

## 2016-06-10 ENCOUNTER — Encounter: Payer: Self-pay | Admitting: Oncology

## 2016-06-10 DIAGNOSIS — C541 Malignant neoplasm of endometrium: Secondary | ICD-10-CM

## 2016-06-10 NOTE — Progress Notes (Signed)
Medical Oncology  Per Dr Sondra Come, radiation to begin week of Dec 18. Only CT sim in EMR at present.  Message sent to schedulers: "cisplatin weekly x 6 Dec 18, Dec 26, Jan 2, Jan 8, Jan 15, Jan 22 Lab day of each chemo LL to see in infusion 12-18 1100 or lunch OK LL Jan 8 at 0830 LL Jul 29 828"  L.Marko Plume, MD

## 2016-06-10 NOTE — Progress Notes (Signed)
Medical Oncology  Scheduling information in documentation earlier 12-4 17 not correct. CDDP is ~ day 1 and ~ day 28 Corrected request sent to schedulers as follows:  cisplatin Dec 18 and  Jan15 Lab day of each chemo LL to see in infusion 12-18 1100 or lunch OK LL Dec 28 + lab LL + lab Jan 11  L.Marko Plume, MD

## 2016-06-12 ENCOUNTER — Ambulatory Visit (HOSPITAL_COMMUNITY)
Admission: RE | Admit: 2016-06-12 | Discharge: 2016-06-12 | Disposition: A | Discharge status: Home / Self Care | Payer: Medicare Other | Source: Ambulatory Visit | Attending: Gynecologic Oncology | Admitting: Gynecologic Oncology

## 2016-06-12 DIAGNOSIS — C541 Malignant neoplasm of endometrium: Secondary | ICD-10-CM | POA: Diagnosis not present

## 2016-06-12 LAB — GLUCOSE, CAPILLARY: GLUCOSE-CAPILLARY: 87 mg/dL (ref 65–99)

## 2016-06-12 MED ORDER — FLUDEOXYGLUCOSE F - 18 (FDG) INJECTION
10.9900 | Freq: Once | INTRAVENOUS | Status: AC | PRN
  Administered 2016-06-12: 10.99 via INTRAVENOUS

## 2016-06-13 ENCOUNTER — Ambulatory Visit
Admission: RE | Admit: 2016-06-13 | Discharge: 2016-06-13 | Disposition: A | Discharge status: Home / Self Care | Payer: Medicare Other | Source: Ambulatory Visit | Attending: Radiation Oncology | Admitting: Radiation Oncology

## 2016-06-13 VITALS — BP 160/93 | HR 64 | Temp 97.9°F | Ht 63.5 in | Wt 221.4 lb

## 2016-06-13 DIAGNOSIS — Z510 Encounter for antineoplastic radiation therapy: Secondary | ICD-10-CM | POA: Diagnosis not present

## 2016-06-13 DIAGNOSIS — C541 Malignant neoplasm of endometrium: Secondary | ICD-10-CM

## 2016-06-13 MED ORDER — SODIUM CHLORIDE 0.9 % IJ SOLN
10.0000 mL | Freq: Once | INTRAMUSCULAR | Status: AC
  Administered 2016-06-13: 10 mL via INTRAVENOUS

## 2016-06-13 NOTE — Progress Notes (Signed)
  Radiation Oncology         [phone] ________________________________  Name: Michelle Rosario MRN: [id number]  Date: 06/13/2016  DOB: [birth year]  SIMULATION AND TREATMENT PLANNING NOTE   DIAGNOSIS:  FIGO stage IA (pT1a, pN0) grade 2 endometrial adenocarcinoma, now with vaginal and pelvic recurrence  NARRATIVE:  The patient was brought to the [facility] suite.  Identity was confirmed.  All relevant records and images related to the planned course of therapy were reviewed.  The patient freely provided informed written consent to proceed with treatment after reviewing the details related to the planned course of therapy. The consent form was witnessed and verified by the simulation staff.  Then, the patient was set-up in a stable reproducible  supine position for radiation therapy.  CT images were obtained.  Surface markings were placed.  The CT images were loaded into the planning software.  Then the target and avoidance structures were contoured.  Treatment planning then occurred.  The radiation prescription was entered and confirmed.  Then, I designed and supervised the construction of a total of 2 medically necessary complex treatment devices.  I have requested : Intensity Modulated Radiotherapy (IMRT) is medically necessary for this case for the following reason:  Small bowel sparing and to provide a simultaneous integrated boost directed at the site of recurrence within the left pelvis.  I have ordered:dose calc.  PLAN:  The patient will receive 45 Gy in 25 fractions to the pelvis. The patient will receive a boost to the vaginal area of 24 Gy in 4 fractions  in light of her recurrence in this area using high dose rate brachytherapy with iridium 192 as the high-dose-rate source. The patient will also receive radiosensitizing chemotherapy during her external beam radiation treatments.  -----------------------------------  Special Treatment Procedure Note: The patient will be receiving  radiosensitizing chemotherapy. Given the potential of increased toxicities related to combined therapy and the necessity for close monitoring of the patient and blood work, this constitutes a special treatment procedure.     Blair Promise, PhD, MD  This document serves as a record of services personally performed by Gery Pray, MD. It was created on his behalf by Bethann Humble, a trained medical scribe. The creation of this record is based on the scribe's personal observations and the provider's statements to them. This document has been checked and approved by the attending provider.

## 2016-06-13 NOTE — Progress Notes (Signed)
Does patient have an allergy to IV contrast dye?: No.   Has patient ever received premedication for IV contrast dye?: No.   Does patient take metformin?: No.  If patient does take metformin when was the last dose: n/a  Date of lab work: May 27, 2016 BUN: 19.1 CR: 0.7  IV site:   There were no vitals taken for this visit.

## 2016-06-13 NOTE — Progress Notes (Signed)
D/c IV left ac, catheter tip intact, 2x2 gause x 2 placed over site and taped, secured, notified  Elmo Putt RN 2:27 PM

## 2016-06-13 NOTE — Progress Notes (Signed)
Does patient have an allergy to IV contrast dye?: No.   Has patient ever received premedication for IV contrast dye?: No.   Does patient take metformin?: No.  If patient does take metformin when was the last dose: n/a  Date of lab work: May 27, 2016 BUN: 19.1 CR: 0.7  IV site:   BP (!) 160/93 (BP Location: Left Arm, Patient Position: Sitting)   Pulse 64   Temp 97.9 F (36.6 C) (Oral)   Ht 5' 3.5" (1.613 m)   Wt 221 lb 6.4 oz (100.4 kg)   SpO2 98%   BMI 38.60 kg/m

## 2016-06-14 ENCOUNTER — Telehealth: Payer: Self-pay

## 2016-06-14 ENCOUNTER — Telehealth: Payer: Self-pay | Admitting: Oncology

## 2016-06-14 NOTE — Telephone Encounter (Signed)
lvm to inform pt of 12/18 appt date/times per LOS

## 2016-06-14 NOTE — Telephone Encounter (Signed)
-----   Message from Gordy Levan, MD sent at 06/14/2016 12:21 PM EST ----- Scheduling message sent 12-4 and 12-8 as follows.  RN please speak with patient by phone to review CDDP prehydration and antiemetics prior to first chemo  "cisplatin Dec 18 and Jan15 Lab day of each chemo LL to see in infusion 12-18 1100 or lunch OK LL Dec 28 + lab LL + lab Jan 11"  Thank you

## 2016-06-14 NOTE — Telephone Encounter (Signed)
Spoke with Michelle Rosario and gave her the appointment date for first treatment on 06-24-16.   Reviewed oral pre hydration the evening prior to treatment and the Am of treatment= five 8 oz glasses of decaf fluid ( H2O great). Reviewed using compazine the first 72 hrs post CDDP d/t receiving Aloxi antiemetic and then can use zofran and or compazine as directed for nausea.  Pt verbalized understanding.  Encouraged her to call the office if any questions.

## 2016-06-19 ENCOUNTER — Telehealth: Payer: Self-pay

## 2016-06-19 DIAGNOSIS — Z510 Encounter for antineoplastic radiation therapy: Secondary | ICD-10-CM | POA: Diagnosis not present

## 2016-06-19 NOTE — Telephone Encounter (Signed)
Pt called with multiple questions about 1st chemo. Answered her questions and reviewed antiemetics and prehydration.

## 2016-06-19 NOTE — Telephone Encounter (Signed)
-----   Message from Gordy Levan, MD sent at 06/14/2016 12:21 PM EST ----- Scheduling message sent 12-4 and 12-8 as follows.  RN please speak with patient by phone to review CDDP prehydration and antiemetics prior to first chemo  "cisplatin Dec 18 and Jan15 Lab day of each chemo LL to see in infusion 12-18 1100 or lunch OK LL Dec 28 + lab LL + lab Jan 11"  Thank you

## 2016-06-23 ENCOUNTER — Other Ambulatory Visit: Payer: Self-pay | Admitting: Oncology

## 2016-06-24 ENCOUNTER — Ambulatory Visit
Admission: RE | Admit: 2016-06-24 | Discharge: 2016-06-24 | Disposition: A | Discharge status: Home / Self Care | Payer: Medicare Other | Source: Ambulatory Visit | Attending: Radiation Oncology | Admitting: Radiation Oncology

## 2016-06-24 ENCOUNTER — Other Ambulatory Visit (HOSPITAL_BASED_OUTPATIENT_CLINIC_OR_DEPARTMENT_OTHER): Payer: Medicare Other

## 2016-06-24 ENCOUNTER — Ambulatory Visit (HOSPITAL_BASED_OUTPATIENT_CLINIC_OR_DEPARTMENT_OTHER): Payer: Medicare Other

## 2016-06-24 ENCOUNTER — Ambulatory Visit (HOSPITAL_BASED_OUTPATIENT_CLINIC_OR_DEPARTMENT_OTHER): Payer: Medicare Other | Admitting: Oncology

## 2016-06-24 VITALS — BP 134/83 | HR 65 | Temp 97.9°F | Resp 20

## 2016-06-24 DIAGNOSIS — C541 Malignant neoplasm of endometrium: Secondary | ICD-10-CM

## 2016-06-24 DIAGNOSIS — Z510 Encounter for antineoplastic radiation therapy: Secondary | ICD-10-CM | POA: Diagnosis not present

## 2016-06-24 DIAGNOSIS — Z5111 Encounter for antineoplastic chemotherapy: Secondary | ICD-10-CM | POA: Diagnosis present

## 2016-06-24 LAB — CBC WITH DIFFERENTIAL/PLATELET
BASO%: 1.2 % (ref 0.0–2.0)
Basophils Absolute: 0.1 10*3/uL (ref 0.0–0.1)
EOS%: 3.6 % (ref 0.0–7.0)
Eosinophils Absolute: 0.3 10*3/uL (ref 0.0–0.5)
HCT: 38.9 % (ref 34.8–46.6)
HEMOGLOBIN: 13 g/dL (ref 11.6–15.9)
LYMPH%: 24.4 % (ref 14.0–49.7)
MCH: 26.6 pg (ref 25.1–34.0)
MCHC: 33.4 g/dL (ref 31.5–36.0)
MCV: 79.7 fL (ref 79.5–101.0)
MONO#: 0.6 10*3/uL (ref 0.1–0.9)
MONO%: 7 % (ref 0.0–14.0)
NEUT%: 63.8 % (ref 38.4–76.8)
NEUTROS ABS: 5.3 10*3/uL (ref 1.5–6.5)
Platelets: 210 10*3/uL (ref 145–400)
RBC: 4.88 10*6/uL (ref 3.70–5.45)
RDW: 13.6 % (ref 11.2–14.5)
WBC: 8.3 10*3/uL (ref 3.9–10.3)
lymph#: 2 10*3/uL (ref 0.9–3.3)

## 2016-06-24 LAB — COMPREHENSIVE METABOLIC PANEL
ALBUMIN: 3.4 g/dL — AB (ref 3.5–5.0)
ALT: 15 U/L (ref 0–55)
AST: 17 U/L (ref 5–34)
Alkaline Phosphatase: 127 U/L (ref 40–150)
Anion Gap: 9 mEq/L (ref 3–11)
BILIRUBIN TOTAL: 0.55 mg/dL (ref 0.20–1.20)
BUN: 17.2 mg/dL (ref 7.0–26.0)
CO2: 25 meq/L (ref 22–29)
CREATININE: 0.7 mg/dL (ref 0.6–1.1)
Calcium: 9.4 mg/dL (ref 8.4–10.4)
Chloride: 105 mEq/L (ref 98–109)
EGFR: 84 mL/min/{1.73_m2} — AB (ref >90)
GLUCOSE: 120 mg/dL (ref 70–140)
Potassium: 3.5 mEq/L (ref 3.5–5.1)
SODIUM: 139 meq/L (ref 136–145)
TOTAL PROTEIN: 7.4 g/dL (ref 6.4–8.3)

## 2016-06-24 LAB — MAGNESIUM: Magnesium: 2 mg/dl (ref 1.5–2.5)

## 2016-06-24 MED ORDER — PALONOSETRON HCL INJECTION 0.25 MG/5ML
0.2500 mg | Freq: Once | INTRAVENOUS | Status: AC
  Administered 2016-06-24: 0.25 mg via INTRAVENOUS

## 2016-06-24 MED ORDER — POTASSIUM CHLORIDE 2 MEQ/ML IV SOLN
Freq: Once | INTRAVENOUS | Status: AC
  Administered 2016-06-24: 09:00:00 via INTRAVENOUS
  Filled 2016-06-24: qty 10

## 2016-06-24 MED ORDER — SODIUM CHLORIDE 0.9 % IV SOLN
Freq: Once | INTRAVENOUS | Status: AC
  Administered 2016-06-24: 09:00:00 via INTRAVENOUS

## 2016-06-24 MED ORDER — PALONOSETRON HCL INJECTION 0.25 MG/5ML
INTRAVENOUS | Status: AC
  Filled 2016-06-24: qty 5

## 2016-06-24 MED ORDER — SODIUM CHLORIDE 0.9 % IV SOLN
47.5000 mg/m2 | Freq: Once | INTRAVENOUS | Status: AC
  Administered 2016-06-24: 100 mg via INTRAVENOUS
  Filled 2016-06-24: qty 100

## 2016-06-24 MED ORDER — PROCHLORPERAZINE EDISYLATE 5 MG/ML IJ SOLN
10.0000 mg | Freq: Once | INTRAMUSCULAR | Status: AC
  Administered 2016-06-24: 10 mg via INTRAVENOUS

## 2016-06-24 MED ORDER — SODIUM CHLORIDE 0.9 % IV SOLN
Freq: Once | INTRAVENOUS | Status: AC
  Administered 2016-06-24: 11:00:00 via INTRAVENOUS
  Filled 2016-06-24: qty 5

## 2016-06-24 MED ORDER — PROCHLORPERAZINE EDISYLATE 5 MG/ML IJ SOLN
INTRAMUSCULAR | Status: AC
  Filled 2016-06-24: qty 2

## 2016-06-24 NOTE — Progress Notes (Signed)
Patient complaints of nausea during treatment.  Order for Compazine 10 mg IV, per Dr. Marko Plume.

## 2016-06-24 NOTE — Progress Notes (Signed)
  Radiation Oncology         [phone] ________________________________  Name: Michelle Rosario MRN: [id number]  Date: 06/24/2016  DOB: 1945/09/02  Simulation Verification Note    ICD-9-CM ICD-10-CM   1. Recurrent carcinoma of endometrium ([place]) 182.0 C54.1     Status: outpatient  NARRATIVE: The patient was brought to the treatment unit and placed in the planned treatment position. The clinical setup was verified. Then port films were obtained and uploaded to the radiation oncology medical record software.  The treatment beams were carefully compared against the planned radiation fields. The position location and shape of the radiation fields was reviewed. They targeted volume of tissue appears to be appropriately covered by the radiation beams. Organs at risk appear to be excluded as planned.  Based on my personal review, I approved the simulation verification. The patient's treatment will proceed as planned.  -----------------------------------  Blair Promise, PhD, MD
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
## 2016-06-24 NOTE — Patient Instructions (Addendum)
[facility] Discharge Instructions for Patients Receiving Chemotherapy  Today you received the following chemotherapy agents Cisplatin  To help prevent nausea and vomiting after your treatment, we encourage you to take your nausea medication as prescribed   If you develop nausea and vomiting that is not controlled by your nausea medication, call the clinic.   BELOW ARE SYMPTOMS THAT SHOULD BE REPORTED IMMEDIATELY:  *FEVER GREATER THAN 100.5 F  *CHILLS WITH OR WITHOUT FEVER  NAUSEA AND VOMITING THAT IS NOT CONTROLLED WITH YOUR NAUSEA MEDICATION  *UNUSUAL SHORTNESS OF BREATH  *UNUSUAL BRUISING OR BLEEDING  TENDERNESS IN MOUTH AND THROAT WITH OR WITHOUT PRESENCE OF ULCERS  *URINARY PROBLEMS  *BOWEL PROBLEMS  UNUSUAL RASH Items with * indicate a potential emergency and should be followed up as soon as possible.  Feel free to call the clinic you have any questions or concerns. The clinic phone number is [phone].  Please show the [facility] at check-in to the Emergency Department and triage nurse.  Cisplatin injection What is this medicine? CISPLATIN (SIS pla tin) is a chemotherapy drug. It targets fast dividing cells, like cancer cells, and causes these cells to die. This medicine is used to treat many types of cancer like bladder, ovarian, and testicular cancers. This medicine may be used for other purposes; ask your health care provider or pharmacist if you have questions. COMMON BRAND NAME(S): Platinol, Platinol -AQ What should I tell my health care provider before I take this medicine? They need to know if you have any of these conditions: -blood disorders -hearing problems -kidney disease -recent or ongoing radiation therapy -an unusual or allergic reaction to cisplatin, carboplatin, other chemotherapy, other medicines, foods, dyes, or preservatives -pregnant or trying to get pregnant -breast-feeding How should I use this medicine? This drug  is given as an infusion into a vein. It is administered in a hospital or clinic by a specially trained health care professional. Talk to your pediatrician regarding the use of this medicine in children. Special care may be needed. Overdosage: If you think you have taken too much of this medicine contact a poison control center or emergency room at once. NOTE: This medicine is only for you. Do not share this medicine with others. What if I miss a dose? It is important not to miss a dose. Call your doctor or health care professional if you are unable to keep an appointment. What may interact with this medicine? -dofetilide -foscarnet -medicines for seizures -medicines to increase blood counts like filgrastim, pegfilgrastim, sargramostim -probenecid -pyridoxine used with altretamine -rituximab -some antibiotics like amikacin, gentamicin, neomycin, polymyxin B, streptomycin, tobramycin -sulfinpyrazone -vaccines -zalcitabine Talk to your doctor or health care professional before taking any of these medicines: -acetaminophen -aspirin -ibuprofen -ketoprofen -naproxen This list may not describe all possible interactions. Give your health care provider a list of all the medicines, herbs, non-prescription drugs, or dietary supplements you use. Also tell them if you smoke, drink alcohol, or use illegal drugs. Some items may interact with your medicine. What should I watch for while using this medicine? Your condition will be monitored carefully while you are receiving this medicine. You will need important blood work done while you are taking this medicine. This drug may make you feel generally unwell. This is not uncommon, as chemotherapy can affect healthy cells as well as cancer cells. Report any side effects. Continue your course of treatment even though you feel ill unless your doctor tells you to stop. In  some cases, you may be given additional medicines to help with side effects. Follow all  directions for their use. Call your doctor or health care professional for advice if you get a fever, chills or sore throat, or other symptoms of a cold or flu. Do not treat yourself. This drug decreases your body's ability to fight infections. Try to avoid being around people who are sick. This medicine may increase your risk to bruise or bleed. Call your doctor or health care professional if you notice any unusual bleeding. Be careful brushing and flossing your teeth or using a toothpick because you may get an infection or bleed more easily. If you have any dental work done, tell your dentist you are receiving this medicine. Avoid taking products that contain aspirin, acetaminophen, ibuprofen, naproxen, or ketoprofen unless instructed by your doctor. These medicines may hide a fever. Do not become pregnant while taking this medicine. Women should inform their doctor if they wish to become pregnant or think they might be pregnant. There is a potential for serious side effects to an unborn child. Talk to your health care professional or pharmacist for more information. Do not breast-feed an infant while taking this medicine. Drink fluids as directed while you are taking this medicine. This will help protect your kidneys. Call your doctor or health care professional if you get diarrhea. Do not treat yourself. What side effects may I notice from receiving this medicine? Side effects that you should report to your doctor or health care professional as soon as possible: -allergic reactions like skin rash, itching or hives, swelling of the face, lips, or tongue -signs of infection - fever or chills, cough, sore throat, pain or difficulty passing urine -signs of decreased platelets or bleeding - bruising, pinpoint red spots on the skin, black, tarry stools, nosebleeds -signs of decreased red blood cells - unusually weak or tired, fainting spells, lightheadedness -breathing problems -changes in  hearing -gout pain -low blood counts - This drug may decrease the number of white blood cells, red blood cells and platelets. You may be at increased risk for infections and bleeding. -nausea and vomiting -pain, swelling, redness or irritation at the injection site -pain, tingling, numbness in the hands or feet -problems with balance, movement -trouble passing urine or change in the amount of urine Side effects that usually do not require medical attention (report to your doctor or health care professional if they continue or are bothersome): -changes in vision -loss of appetite -metallic taste in the mouth or changes in taste This list may not describe all possible side effects. Call your doctor for medical advice about side effects. You may report side effects to FDA at 1-800-FDA-1088. Where should I keep my medicine? This drug is given in a hospital or clinic and will not be stored at home. NOTE: This sheet is a summary. It may not cover all possible information. If you have questions about this medicine, talk to your doctor, pharmacist, or health care provider.  2017 Elsevier/Gold Standard (2007-09-29 14:40:54)

## 2016-06-25 ENCOUNTER — Ambulatory Visit
Admission: RE | Admit: 2016-06-25 | Discharge: 2016-06-25 | Disposition: A | Discharge status: Home / Self Care | Payer: Medicare Other | Source: Ambulatory Visit | Attending: Radiation Oncology | Admitting: Radiation Oncology

## 2016-06-25 ENCOUNTER — Telehealth: Payer: Self-pay

## 2016-06-25 ENCOUNTER — Encounter: Payer: Self-pay | Admitting: Radiation Oncology

## 2016-06-25 VITALS — BP 155/86 | HR 85 | Temp 98.2°F | Ht 63.5 in | Wt 221.1 lb

## 2016-06-25 DIAGNOSIS — C541 Malignant neoplasm of endometrium: Secondary | ICD-10-CM | POA: Insufficient documentation

## 2016-06-25 DIAGNOSIS — Z923 Personal history of irradiation: Secondary | ICD-10-CM | POA: Insufficient documentation

## 2016-06-25 DIAGNOSIS — Z510 Encounter for antineoplastic radiation therapy: Secondary | ICD-10-CM | POA: Diagnosis not present

## 2016-06-25 DIAGNOSIS — Z9221 Personal history of antineoplastic chemotherapy: Secondary | ICD-10-CM

## 2016-06-25 MED ORDER — BIAFINE EX EMUL
CUTANEOUS | Status: DC | PRN
  Administered 2016-06-25: 17:00:00 via TOPICAL

## 2016-06-25 NOTE — Progress Notes (Signed)
  Radiation Oncology         [phone] ________________________________  Name: Michelle Rosario MRN: [id number]  Date: 06/25/2016  DOB: 28-Aug-1945  Weekly Radiation Therapy Management    ICD-9-CM ICD-10-CM   1. Recurrent carcinoma of endometrium ([facility]) 182.0 C54.1      Current Dose: 4.4 Gy     Planned Dose:  55+ Gy  Narrative . . . . . . . . The patient presents for routine under treatment assessment.                                    Michelle Rosario is here for her 2nd fraction of radiation to her Pelvis. She denies pain. She denies any symptoms related to her Radiation at this time. She did receive her first chemotherapy yesterday and reports it went well, though she did have some mild nausea. She was provided education today, and biafine cream to use twice daily as directed.                                   Set-up films were reviewed.                                 The chart was checked. Physical Findings. . .  height is 5' 3.5" (1.613 m) and weight is 221 lb 1.6 oz (100.3 kg). Her temperature is 98.2 F (36.8 C). Her blood pressure is 155/86 (abnormal) and her pulse is 85. Her oxygen saturation is 96%. . Weight essentially stable. Lungs are clear to auscultation bilaterally. Heart has regular rate and rhythm. Impression . . . . . . . The patient is tolerating radiation. Plan . . . . . . . . . . . . Continue treatment as planned.  ________________________________   Blair Promise, PhD, MD  This document serves as a record of services personally performed by Michelle Pray, MD. It was created on his behalf by Darcus Austin, a trained medical scribe. The creation of this record is based on the scribe's personal observations and the provider's statements to them. This document has been checked and approved by the attending provider.

## 2016-06-25 NOTE — Progress Notes (Signed)
Pt here for patient teaching.  Pt given Radiation and You booklet, skin care instructions and Sonafine. Pt reports they have not watched the Radiation Therapy Education video, but was given the link to watch at home.  Reviewed areas of pertinence such as fatigue, hair loss, nausea and vomiting, sexual and fertility changes, skin changes and urinary and bladder changes . Pt able to give teach back of to pat skin, use unscented/gentle soap, use baby wipes, have Imodium on hand and drink plenty of water,apply Sonafine bid and avoid applying anything to skin within 4 hours of treatment. Pt verbalizes understanding of information given and will contact nursing with any questions or concerns.     Http://[email or web address]/whattoexpect/index

## 2016-06-25 NOTE — Telephone Encounter (Signed)
Michelle Rosario did well after treatment.  Slept all night. No n/v today.  Drinking and eating well. Bowels moving well.  She is very pleased that she feels good. Pt  given appointment date and timefor follow up with Dr. Marko Plume on 07-04-16.

## 2016-06-25 NOTE — Progress Notes (Signed)
Michelle Rosario is here for her 2nd fraction of radiation to her Pelvis. She denies pain. She denies any symptoms related to her Radiation at this time. She did receive her first chemotherapy yesterday, and reports it went well, though she did have some mild nausea. She was provided education today, and biafine cream to use twice daily as directed.   BP (!) 155/86   Pulse 85   Temp 98.2 F (36.8 C)   Ht 5' 3.5" (1.613 m)   Wt 221 lb 1.6 oz (100.3 kg)   SpO2 96% Comment: room air  BMI 38.55 kg/m    Wt Readings from Last 3 Encounters:  06/25/16 221 lb 1.6 oz (100.3 kg)  06/13/16 221 lb 6.4 oz (100.4 kg)  06/05/16 220 lb 12.8 oz (100.2 kg)

## 2016-06-26 ENCOUNTER — Encounter: Payer: Self-pay | Admitting: Oncology

## 2016-06-26 ENCOUNTER — Ambulatory Visit
Admission: RE | Admit: 2016-06-26 | Discharge: 2016-06-26 | Disposition: A | Discharge status: Home / Self Care | Payer: Medicare Other | Source: Ambulatory Visit | Attending: Radiation Oncology | Admitting: Radiation Oncology

## 2016-06-26 DIAGNOSIS — Z510 Encounter for antineoplastic radiation therapy: Secondary | ICD-10-CM | POA: Diagnosis not present

## 2016-06-27 ENCOUNTER — Ambulatory Visit
Admission: RE | Admit: 2016-06-27 | Discharge: 2016-06-27 | Disposition: A | Discharge status: Home / Self Care | Payer: Medicare Other | Source: Ambulatory Visit | Attending: Radiation Oncology | Admitting: Radiation Oncology

## 2016-06-27 DIAGNOSIS — Z510 Encounter for antineoplastic radiation therapy: Secondary | ICD-10-CM | POA: Diagnosis not present

## 2016-06-28 ENCOUNTER — Ambulatory Visit
Admission: RE | Admit: 2016-06-28 | Discharge: 2016-06-28 | Disposition: A | Discharge status: Home / Self Care | Payer: Medicare Other | Source: Ambulatory Visit | Attending: Radiation Oncology | Admitting: Radiation Oncology

## 2016-06-28 DIAGNOSIS — Z510 Encounter for antineoplastic radiation therapy: Secondary | ICD-10-CM | POA: Diagnosis not present

## 2016-07-02 ENCOUNTER — Encounter: Payer: Self-pay | Admitting: Radiation Oncology

## 2016-07-02 ENCOUNTER — Ambulatory Visit
Admission: RE | Admit: 2016-07-02 | Discharge: 2016-07-02 | Disposition: A | Discharge status: Home / Self Care | Payer: Medicare Other | Source: Ambulatory Visit | Attending: Radiation Oncology | Admitting: Radiation Oncology

## 2016-07-02 VITALS — BP 138/78 | HR 78 | Temp 98.3°F | Ht 63.5 in | Wt 218.0 lb

## 2016-07-02 DIAGNOSIS — Z510 Encounter for antineoplastic radiation therapy: Secondary | ICD-10-CM | POA: Diagnosis not present

## 2016-07-02 DIAGNOSIS — C541 Malignant neoplasm of endometrium: Secondary | ICD-10-CM

## 2016-07-02 NOTE — Progress Notes (Signed)
   Weekly Management Note:  Outpatient    ICD-9-CM ICD-10-CM   1. Recurrent carcinoma of endometrium (HCC) 182.0 C54.1     Current Dose:  13.2 Gy  Projected Dose: 55 Gy   Narrative:  The patient presents for routine under treatment assessment.  CBCT/MVCT images/Port film x-rays were reviewed.  The chart was checked.   Michelle Rosario presents for her 6th fraction of radiation to her Pelvis. She denies pain or fatigue. She received Chemotherapy this past Monday and her next dose is scheduled for 07/22/16. She reports some nausea after her last chemotherapy, and had relief with with zofran and compazine. She reports some loose stools that occur off and on. For example she had no loose stools yesterday, but has had 3  today. She denies any urinary symptoms. She is using biafine off and on, and was encouraged to use it twice daily starting today.   Physical Findings:  height is 5' 3.5" (1.613 m) and weight is 218 lb (98.9 kg). Her temperature is 98.3 F (36.8 C). Her blood pressure is 138/78 and her pulse is 78. Her oxygen saturation is 98%.   Wt Readings from Last 3 Encounters:  07/02/16 218 lb (98.9 kg)  06/25/16 221 lb 1.6 oz (100.3 kg)  06/13/16 221 lb 6.4 oz (100.4 kg)   Alert and in no acute distress.   Impression:  The patient is tolerating radiotherapy.  Plan:  Continue radiotherapy as planned. I encouraged the patient to use Imodium AD as needed for loose stool.  ________________________________   Eppie Gibson, M.D.   This document serves as a record of services personally performed by Eppie Gibson, MD. It was created on her behalf by Arlyce Harman, a trained medical scribe. The creation of this record is based on the scribe's personal observations and the provider's statements to them. This document has been checked and approved by the attending provider.

## 2016-07-02 NOTE — Progress Notes (Signed)
Ms. Woloszyk presents for her 6th fraction of radiation to her Pelvis. She denies pain or fatigue. She received Chemotherapy this past Monday and her next dose is scheduled for 07/22/16. She reports some nausea after her last chemotherapy, and had relief with with zofran and compazine. She reports some loose stools that occur off and on. For example she had no loose stools yesterday, but has had 3  today. She denies any urinary symptoms. She is using biafine off and on, and was encouraged to use it twice daily starting today.   BP 138/78   Pulse 78   Temp 98.3 F (36.8 C)   Ht 5' 3.5" (1.613 m)   Wt 218 lb (98.9 kg)   SpO2 98% Comment: room air  BMI 38.01 kg/m    Wt Readings from Last 3 Encounters:  07/02/16 218 lb (98.9 kg)  06/25/16 221 lb 1.6 oz (100.3 kg)  06/13/16 221 lb 6.4 oz (100.4 kg)

## 2016-07-03 ENCOUNTER — Ambulatory Visit
Admission: RE | Admit: 2016-07-03 | Discharge: 2016-07-03 | Disposition: A | Discharge status: Home / Self Care | Payer: Medicare Other | Source: Ambulatory Visit | Attending: Radiation Oncology | Admitting: Radiation Oncology

## 2016-07-03 ENCOUNTER — Other Ambulatory Visit: Payer: Self-pay | Admitting: Oncology

## 2016-07-03 DIAGNOSIS — Z510 Encounter for antineoplastic radiation therapy: Secondary | ICD-10-CM | POA: Diagnosis not present

## 2016-07-04 ENCOUNTER — Ambulatory Visit (HOSPITAL_BASED_OUTPATIENT_CLINIC_OR_DEPARTMENT_OTHER): Payer: Medicare Other | Admitting: Oncology

## 2016-07-04 ENCOUNTER — Ambulatory Visit
Admission: RE | Admit: 2016-07-04 | Discharge: 2016-07-04 | Disposition: A | Discharge status: Home / Self Care | Payer: Medicare Other | Source: Ambulatory Visit | Attending: Radiation Oncology | Admitting: Radiation Oncology

## 2016-07-04 ENCOUNTER — Encounter: Payer: Self-pay | Admitting: Oncology

## 2016-07-04 ENCOUNTER — Other Ambulatory Visit (HOSPITAL_BASED_OUTPATIENT_CLINIC_OR_DEPARTMENT_OTHER): Payer: Medicare Other

## 2016-07-04 VITALS — BP 134/72 | HR 75 | Temp 98.0°F | Resp 18 | Ht 63.5 in | Wt 219.6 lb

## 2016-07-04 DIAGNOSIS — I809 Phlebitis and thrombophlebitis of unspecified site: Secondary | ICD-10-CM

## 2016-07-04 DIAGNOSIS — E039 Hypothyroidism, unspecified: Secondary | ICD-10-CM

## 2016-07-04 DIAGNOSIS — C541 Malignant neoplasm of endometrium: Secondary | ICD-10-CM | POA: Diagnosis present

## 2016-07-04 DIAGNOSIS — Z5111 Encounter for antineoplastic chemotherapy: Secondary | ICD-10-CM

## 2016-07-04 DIAGNOSIS — Z510 Encounter for antineoplastic radiation therapy: Secondary | ICD-10-CM | POA: Diagnosis not present

## 2016-07-04 DIAGNOSIS — T801XXA Vascular complications following infusion, transfusion and therapeutic injection, initial encounter: Secondary | ICD-10-CM

## 2016-07-04 LAB — COMPREHENSIVE METABOLIC PANEL
ALBUMIN: 3.5 g/dL (ref 3.5–5.0)
ALK PHOS: 137 U/L (ref 40–150)
ALT: 16 U/L (ref 0–55)
ANION GAP: 9 meq/L (ref 3–11)
AST: 17 U/L (ref 5–34)
BILIRUBIN TOTAL: 0.3 mg/dL (ref 0.20–1.20)
BUN: 21.3 mg/dL (ref 7.0–26.0)
CALCIUM: 9.7 mg/dL (ref 8.4–10.4)
CO2: 28 mEq/L (ref 22–29)
Chloride: 101 mEq/L (ref 98–109)
Creatinine: 0.8 mg/dL (ref 0.6–1.1)
EGFR: 78 mL/min/{1.73_m2} — AB (ref >90)
GLUCOSE: 93 mg/dL (ref 70–140)
POTASSIUM: 4.2 meq/L (ref 3.5–5.1)
SODIUM: 138 meq/L (ref 136–145)
TOTAL PROTEIN: 7.1 g/dL (ref 6.4–8.3)

## 2016-07-04 LAB — CBC WITH DIFFERENTIAL/PLATELET
BASO%: 0.7 % (ref 0.0–2.0)
BASOS ABS: 0 10*3/uL (ref 0.0–0.1)
EOS ABS: 0.3 10*3/uL (ref 0.0–0.5)
EOS%: 5 % (ref 0.0–7.0)
HCT: 35.7 % (ref 34.8–46.6)
HEMOGLOBIN: 12 g/dL (ref 11.6–15.9)
LYMPH%: 16.4 % (ref 14.0–49.7)
MCH: 27.1 pg (ref 25.1–34.0)
MCHC: 33.7 g/dL (ref 31.5–36.0)
MCV: 80.4 fL (ref 79.5–101.0)
MONO#: 0.6 10*3/uL (ref 0.1–0.9)
MONO%: 8.7 % (ref 0.0–14.0)
NEUT%: 69.2 % (ref 38.4–76.8)
NEUTROS ABS: 4.6 10*3/uL (ref 1.5–6.5)
PLATELETS: 188 10*3/uL (ref 145–400)
RBC: 4.44 10*6/uL (ref 3.70–5.45)
RDW: 13.6 % (ref 11.2–14.5)
WBC: 6.7 10*3/uL (ref 3.9–10.3)
lymph#: 1.1 10*3/uL (ref 0.9–3.3)

## 2016-07-04 LAB — MAGNESIUM: MAGNESIUM: 2.2 mg/dL (ref 1.5–2.5)
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
## 2016-07-05 ENCOUNTER — Ambulatory Visit
Admission: RE | Admit: 2016-07-05 | Discharge: 2016-07-05 | Disposition: A | Discharge status: Home / Self Care | Payer: Medicare Other | Source: Ambulatory Visit | Attending: Radiation Oncology | Admitting: Radiation Oncology

## 2016-07-05 DIAGNOSIS — Z510 Encounter for antineoplastic radiation therapy: Secondary | ICD-10-CM | POA: Diagnosis not present

## 2016-07-09 ENCOUNTER — Ambulatory Visit
Admission: RE | Admit: 2016-07-09 | Discharge: 2016-07-09 | Disposition: A | Discharge status: Home / Self Care | Payer: Medicare Other | Source: Ambulatory Visit | Attending: Radiation Oncology | Admitting: Radiation Oncology

## 2016-07-09 ENCOUNTER — Encounter: Payer: Self-pay | Admitting: Radiation Oncology

## 2016-07-09 VITALS — BP 134/90 | HR 75 | Temp 98.2°F | Ht 63.5 in | Wt 217.8 lb

## 2016-07-09 DIAGNOSIS — C541 Malignant neoplasm of endometrium: Secondary | ICD-10-CM

## 2016-07-09 DIAGNOSIS — Z9889 Other specified postprocedural states: Secondary | ICD-10-CM | POA: Diagnosis not present

## 2016-07-09 DIAGNOSIS — Z510 Encounter for antineoplastic radiation therapy: Secondary | ICD-10-CM | POA: Diagnosis not present

## 2016-07-09 DIAGNOSIS — Z9221 Personal history of antineoplastic chemotherapy: Secondary | ICD-10-CM | POA: Diagnosis not present

## 2016-07-09 DIAGNOSIS — Z79899 Other long term (current) drug therapy: Secondary | ICD-10-CM | POA: Diagnosis not present

## 2016-07-09 NOTE — Progress Notes (Signed)
  Radiation Oncology         [phone] ________________________________  Name: Michelle Rosario MRN: [id number]  Date: 07/09/2016  DOB: 11-05-1945  Weekly Radiation Therapy Management    ICD-9-CM ICD-10-CM   1. Recurrent carcinoma of endometrium (HCC) 182.0 C54.1     Current Dose: 22 Gy     Planned Dose:  55 + Gy  Narrative . . . . . . . . The patient presents for routine under treatment assessment.                                    Michelle Rosario has completed 10 fractions to her pelvis.  She denies having pain or any bladder issues.  She reports having occasional loose stools and has had to take Imodium once. Reports nausea and slight fatigue. She denies having any vaginal/rectal bleeding or skin irritation.  She will have chemotherapy on 07/23/15.                                  Set-up films were reviewed.                                 The chart was checked. Physical Findings. . .  height is 5' 3.5" (1.613 m) and weight is 217 lb 12.8 oz (98.8 kg). Her oral temperature is 98.2 F (36.8 C). Her blood pressure is 134/90 and her pulse is 75. Her oxygen saturation is 100%. . Weight essentially stable. Lungs are clear to auscultation bilaterally. Heart has regular rate and rhythm. Abdomen soft, non-tender, normal bowel sounds. Impression . . . . . . . The patient is tolerating radiation. Plan . . . . . . . . . . . . Continue treatment as planned.  ________________________________   Blair Promise, PhD, MD  This document serves as a record of services personally performed by Gery Pray, MD. It was created on his behalf by Darcus Austin, a trained medical scribe. The creation of this record is based on the scribe's personal observations and the provider's statements to them. This document has been checked and approved by the attending provider.

## 2016-07-09 NOTE — Progress Notes (Signed)
Michelle Rosario has completed 10 fractions to her pelvis.  She denies having pain or any bladder issues.  She reports having occasional loose stools and has had to take Imodium once.  She denies having any vaginal/rectal bleeding or skin irritation.  She will have chemotherapy on 07/23/15.  She reports having slight fatigue.  BP 134/90 (BP Location: Left Arm, Patient Position: Sitting)   Pulse 75   Temp 98.2 F (36.8 C) (Oral)   Ht 5' 3.5" (1.613 m)   Wt 217 lb 12.8 oz (98.8 kg)   SpO2 100%   BMI 37.98 kg/m    Wt Readings from Last 3 Encounters:  07/09/16 217 lb 12.8 oz (98.8 kg)  07/04/16 219 lb 9.6 oz (99.6 kg)  07/02/16 218 lb (98.9 kg)

## 2016-07-10 ENCOUNTER — Ambulatory Visit
Admission: RE | Admit: 2016-07-10 | Discharge: 2016-07-10 | Disposition: A | Discharge status: Home / Self Care | Payer: Medicare Other | Source: Ambulatory Visit | Attending: Radiation Oncology | Admitting: Radiation Oncology

## 2016-07-10 DIAGNOSIS — Z510 Encounter for antineoplastic radiation therapy: Secondary | ICD-10-CM | POA: Diagnosis not present

## 2016-07-11 ENCOUNTER — Ambulatory Visit
Admission: RE | Admit: 2016-07-11 | Discharge: 2016-07-11 | Disposition: A | Discharge status: Home / Self Care | Payer: Medicare Other | Source: Ambulatory Visit | Attending: Radiation Oncology | Admitting: Radiation Oncology

## 2016-07-11 DIAGNOSIS — Z510 Encounter for antineoplastic radiation therapy: Secondary | ICD-10-CM | POA: Diagnosis not present

## 2016-07-12 ENCOUNTER — Ambulatory Visit
Admission: RE | Admit: 2016-07-12 | Discharge: 2016-07-12 | Disposition: A | Discharge status: Home / Self Care | Payer: Medicare Other | Source: Ambulatory Visit | Attending: Radiation Oncology | Admitting: Radiation Oncology

## 2016-07-12 DIAGNOSIS — Z510 Encounter for antineoplastic radiation therapy: Secondary | ICD-10-CM | POA: Diagnosis not present

## 2016-07-15 ENCOUNTER — Ambulatory Visit
Admission: RE | Admit: 2016-07-15 | Discharge: 2016-07-15 | Disposition: A | Discharge status: Home / Self Care | Payer: Medicare Other | Source: Ambulatory Visit | Attending: Radiation Oncology | Admitting: Radiation Oncology

## 2016-07-15 DIAGNOSIS — Z510 Encounter for antineoplastic radiation therapy: Secondary | ICD-10-CM | POA: Diagnosis not present

## 2016-07-16 ENCOUNTER — Encounter: Payer: Self-pay | Admitting: Radiation Oncology

## 2016-07-16 ENCOUNTER — Ambulatory Visit
Admission: RE | Admit: 2016-07-16 | Discharge: 2016-07-16 | Disposition: A | Discharge status: Home / Self Care | Payer: Medicare Other | Source: Ambulatory Visit | Attending: Radiation Oncology | Admitting: Radiation Oncology

## 2016-07-16 VITALS — BP 138/81 | HR 69 | Temp 98.3°F | Ht 63.5 in | Wt 214.8 lb

## 2016-07-16 DIAGNOSIS — C541 Malignant neoplasm of endometrium: Secondary | ICD-10-CM

## 2016-07-16 DIAGNOSIS — Z510 Encounter for antineoplastic radiation therapy: Secondary | ICD-10-CM | POA: Diagnosis not present

## 2016-07-16 MED ORDER — LORAZEPAM 1 MG PO TABS: 1.0000 mg | ORAL_TABLET | Freq: Three times a day (TID) | ORAL | 0 refills | Status: DC | PRN

## 2016-07-16 NOTE — Progress Notes (Signed)
Michelle Rosario has completed 15 fractions to her pelvis.  She reports having stomach pains after eating dinner for the past 3 days.  She reports she is taking Zofran every 8 hours.  She is also having diarrhea about twice a day and is taking Imodium.  She is wondering if she can take compazine as well to help with the stomach pains.  She denies having any bladder issues or vaginal bleeding.  She reports having trouble sleeping and takes trazodone and xanax.  She reports feeling tired during the day.  She denies having any skin irritation.  She will have chemotherapy on Monday.  BP 138/81 (BP Location: Left Arm, Patient Position: Sitting)   Pulse 69   Temp 98.3 F (36.8 C) (Oral)   Ht 5' 3.5" (1.613 m)   Wt 214 lb 12.8 oz (97.4 kg)   SpO2 99%   BMI 37.45 kg/m    Wt Readings from Last 3 Encounters:  07/16/16 214 lb 12.8 oz (97.4 kg)  07/09/16 217 lb 12.8 oz (98.8 kg)  07/04/16 219 lb 9.6 oz (99.6 kg)

## 2016-07-16 NOTE — Progress Notes (Signed)
  Radiation Oncology         [phone] ________________________________  Name: BANEZA LEUNG MRN: [id number]  Date: 07/16/2016  DOB: 1945-11-04  Weekly Radiation Therapy Management    ICD-9-CM ICD-10-CM   1. Recurrent carcinoma of endometrium (HCC) 182.0 C54.1     Current Dose: 33 Gy     Planned Dose:  55 + Gy  Narrative . . . . . . . . The patient presents for routine under treatment assessment.                                    Tkai Tiley has completed 15 fractions to her pelvis. She reports abdominal pain after eating dinner for the past 3 days. She does not describe it as bloating. She describes it as somewhat of a cramping. She reports she is taking Zofran every 8 hours for nausea. She is also having diarrhea about twice a day and is taking Imodium. She is wondering if she can take compazine as well to help with the pain. She has compazine at home. She denies having any bladder issues or vaginal bleeding. She reports having trouble sleeping and takes trazodone and xanax. She reports fatigue during the day. She denies having any skin irritation.  She will have chemotherapy on Monday.                                  Set-up films were reviewed.                                 The chart was checked. Physical Findings. . .  height is 5' 3.5" (1.613 m) and weight is 214 lb 12.8 oz (97.4 kg). Her oral temperature is 98.3 F (36.8 C). Her blood pressure is 138/81 and her pulse is 69. Her oxygen saturation is 99%. . Weight essentially stable. Lungs are clear to auscultation bilaterally. Heart has regular rate and rhythm. Abdomen soft, non-tender, normal bowel sounds. Impression . . . . . . . The patient is tolerating radiation. Plan . . . . . . . . . . . . Continue treatment as planned. I will prescribe Ativan for the patient's nausea. She will try ativan before taking the compazine. ________________________________   Blair Promise, PhD, MD  This document serves as a record of  services personally performed by Gery Pray, MD. It was created on his behalf by Darcus Austin, a trained medical scribe. The creation of this record is based on the scribe's personal observations and the provider's statements to them. This document has been checked and approved by the attending provider.

## 2016-07-17 ENCOUNTER — Other Ambulatory Visit: Payer: Self-pay

## 2016-07-17 ENCOUNTER — Ambulatory Visit
Admission: RE | Admit: 2016-07-17 | Discharge: 2016-07-17 | Disposition: A | Discharge status: Home / Self Care | Payer: Medicare Other | Source: Ambulatory Visit | Attending: Radiation Oncology | Admitting: Radiation Oncology

## 2016-07-17 DIAGNOSIS — Z510 Encounter for antineoplastic radiation therapy: Secondary | ICD-10-CM | POA: Diagnosis not present

## 2016-07-17 DIAGNOSIS — C541 Malignant neoplasm of endometrium: Secondary | ICD-10-CM

## 2016-07-17 MED ORDER — ONDANSETRON HCL 8 MG PO TABS: 8.0000 mg | ORAL_TABLET | Freq: Three times a day (TID) | ORAL | 0 refills | Status: DC | PRN

## 2016-07-18 ENCOUNTER — Ambulatory Visit
Admission: RE | Admit: 2016-07-18 | Discharge: 2016-07-18 | Disposition: A | Discharge status: Home / Self Care | Payer: Medicare Other | Source: Ambulatory Visit | Attending: Radiation Oncology | Admitting: Radiation Oncology

## 2016-07-18 ENCOUNTER — Other Ambulatory Visit: Payer: Self-pay | Admitting: Oncology

## 2016-07-18 ENCOUNTER — Ambulatory Visit (HOSPITAL_BASED_OUTPATIENT_CLINIC_OR_DEPARTMENT_OTHER): Payer: Medicare Other | Admitting: Oncology

## 2016-07-18 ENCOUNTER — Other Ambulatory Visit (HOSPITAL_BASED_OUTPATIENT_CLINIC_OR_DEPARTMENT_OTHER): Payer: Medicare Other

## 2016-07-18 VITALS — BP 137/77 | HR 75 | Temp 97.7°F | Resp 18 | Ht 63.5 in | Wt 215.2 lb

## 2016-07-18 DIAGNOSIS — R197 Diarrhea, unspecified: Secondary | ICD-10-CM

## 2016-07-18 DIAGNOSIS — C541 Malignant neoplasm of endometrium: Secondary | ICD-10-CM | POA: Diagnosis present

## 2016-07-18 DIAGNOSIS — Z5111 Encounter for antineoplastic chemotherapy: Secondary | ICD-10-CM

## 2016-07-18 DIAGNOSIS — Z510 Encounter for antineoplastic radiation therapy: Secondary | ICD-10-CM | POA: Diagnosis not present

## 2016-07-18 DIAGNOSIS — E039 Hypothyroidism, unspecified: Secondary | ICD-10-CM

## 2016-07-18 LAB — CBC WITH DIFFERENTIAL/PLATELET
BASO%: 1 % (ref 0.0–2.0)
BASOS ABS: 0 10*3/uL (ref 0.0–0.1)
EOS ABS: 0.5 10*3/uL (ref 0.0–0.5)
EOS%: 13.3 % — ABNORMAL HIGH (ref 0.0–7.0)
HCT: 36.1 % (ref 34.8–46.6)
HGB: 12.2 g/dL (ref 11.6–15.9)
LYMPH%: 12.8 % — AB (ref 14.0–49.7)
MCH: 27.4 pg (ref 25.1–34.0)
MCHC: 33.8 g/dL (ref 31.5–36.0)
MCV: 81.1 fL (ref 79.5–101.0)
MONO#: 0.4 10*3/uL (ref 0.1–0.9)
MONO%: 10.7 % (ref 0.0–14.0)
NEUT%: 62.2 % (ref 38.4–76.8)
NEUTROS ABS: 2.4 10*3/uL (ref 1.5–6.5)
PLATELETS: 141 10*3/uL — AB (ref 145–400)
RBC: 4.45 10*6/uL (ref 3.70–5.45)
RDW: 14.3 % (ref 11.2–14.5)
WBC: 3.8 10*3/uL — AB (ref 3.9–10.3)
lymph#: 0.5 10*3/uL — ABNORMAL LOW (ref 0.9–3.3)

## 2016-07-18 LAB — COMPREHENSIVE METABOLIC PANEL
ALBUMIN: 3.6 g/dL (ref 3.5–5.0)
ALK PHOS: 137 U/L (ref 40–150)
ALT: 16 U/L (ref 0–55)
AST: 18 U/L (ref 5–34)
Anion Gap: 9 mEq/L (ref 3–11)
BUN: 14.6 mg/dL (ref 7.0–26.0)
CALCIUM: 9.8 mg/dL (ref 8.4–10.4)
CO2: 28 mEq/L (ref 22–29)
CREATININE: 0.8 mg/dL (ref 0.6–1.1)
Chloride: 105 mEq/L (ref 98–109)
EGFR: 78 mL/min/{1.73_m2} — ABNORMAL LOW (ref >90)
Glucose: 95 mg/dl (ref 70–140)
Potassium: 3.7 mEq/L (ref 3.5–5.1)
Sodium: 142 mEq/L (ref 136–145)
Total Bilirubin: 0.46 mg/dL (ref 0.20–1.20)
Total Protein: 7.1 g/dL (ref 6.4–8.3)

## 2016-07-18 LAB — MAGNESIUM: MAGNESIUM: 1.9 mg/dL (ref 1.5–2.5)

## 2016-07-18 LAB — TSH: TSH: 4.093 m(IU)/L — ABNORMAL HIGH (ref 0.308–3.960)

## 2016-07-18 MED ORDER — ONDANSETRON HCL 8 MG PO TABS
8.0000 mg | ORAL_TABLET | Freq: Once | ORAL | Status: AC
  Administered 2016-07-18: 8 mg via ORAL

## 2016-07-18 MED ORDER — ONDANSETRON HCL 8 MG PO TABS
ORAL_TABLET | ORAL | Status: AC
  Filled 2016-07-18: qty 1

## 2016-07-18 NOTE — Progress Notes (Signed)
Patient was given a sitz bath for skin irritation.
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
## 2016-07-19 ENCOUNTER — Telehealth: Payer: Self-pay

## 2016-07-19 ENCOUNTER — Encounter: Payer: Self-pay | Admitting: Oncology

## 2016-07-19 ENCOUNTER — Ambulatory Visit
Admission: RE | Admit: 2016-07-19 | Discharge: 2016-07-19 | Disposition: A | Discharge status: Home / Self Care | Payer: Medicare Other | Source: Ambulatory Visit | Attending: Radiation Oncology | Admitting: Radiation Oncology

## 2016-07-19 DIAGNOSIS — Z510 Encounter for antineoplastic radiation therapy: Secondary | ICD-10-CM | POA: Diagnosis not present

## 2016-07-19 LAB — T3 UPTAKE
FREE THYROXINE INDEX: 2.1 (ref 1.2–4.9)
T3 UPTAKE RATIO: 29 % (ref 24–39)

## 2016-07-19 LAB — T4: Thyroxine (T4): 7.1 ug/dL (ref 4.5–12.0)

## 2016-07-19 NOTE — Telephone Encounter (Signed)
-----   Message from Gordy Levan, MD sent at 07/19/2016  8:08 AM EST ----- Labs seen and need follow up Please let her know TSH a little high (which is HYPOthyroid indicator) with other TFTs normal range. Results sent to Dr Redmond Pulling, who manages her synthroid     thanks

## 2016-07-19 NOTE — Telephone Encounter (Signed)
Spoke with Ms. Michelle Rosario and told her the results of the TSH as noted below by Dr. Marko Plume. [name] will f/u with Dr. Redmond Pulling about synthroid dose.

## 2016-07-22 ENCOUNTER — Ambulatory Visit: Payer: Medicare Other

## 2016-07-22 ENCOUNTER — Other Ambulatory Visit (HOSPITAL_BASED_OUTPATIENT_CLINIC_OR_DEPARTMENT_OTHER): Payer: Medicare Other

## 2016-07-22 ENCOUNTER — Ambulatory Visit (HOSPITAL_BASED_OUTPATIENT_CLINIC_OR_DEPARTMENT_OTHER): Payer: Medicare Other

## 2016-07-22 ENCOUNTER — Other Ambulatory Visit: Payer: Self-pay | Admitting: Oncology

## 2016-07-22 VITALS — BP 133/82 | HR 83 | Temp 98.0°F | Resp 18

## 2016-07-22 DIAGNOSIS — C541 Malignant neoplasm of endometrium: Secondary | ICD-10-CM

## 2016-07-22 DIAGNOSIS — Z5111 Encounter for antineoplastic chemotherapy: Secondary | ICD-10-CM

## 2016-07-22 LAB — COMPREHENSIVE METABOLIC PANEL
ALT: 17 U/L (ref 0–55)
ANION GAP: 11 meq/L (ref 3–11)
AST: 17 U/L (ref 5–34)
Albumin: 3.4 g/dL — ABNORMAL LOW (ref 3.5–5.0)
Alkaline Phosphatase: 125 U/L (ref 40–150)
BILIRUBIN TOTAL: 0.64 mg/dL (ref 0.20–1.20)
BUN: 11 mg/dL (ref 7.0–26.0)
CO2: 24 meq/L (ref 22–29)
Calcium: 9.3 mg/dL (ref 8.4–10.4)
Chloride: 103 mEq/L (ref 98–109)
Creatinine: 0.8 mg/dL (ref 0.6–1.1)
EGFR: 76 mL/min/{1.73_m2} — AB (ref >90)
GLUCOSE: 110 mg/dL (ref 70–140)
POTASSIUM: 3.2 meq/L — AB (ref 3.5–5.1)
SODIUM: 138 meq/L (ref 136–145)
TOTAL PROTEIN: 6.6 g/dL (ref 6.4–8.3)

## 2016-07-22 LAB — CBC WITH DIFFERENTIAL/PLATELET
BASO%: 1.2 % (ref 0.0–2.0)
BASOS ABS: 0.1 10*3/uL (ref 0.0–0.1)
EOS ABS: 0.8 10*3/uL — AB (ref 0.0–0.5)
EOS%: 18.1 % — ABNORMAL HIGH (ref 0.0–7.0)
HCT: 35.8 % (ref 34.8–46.6)
HGB: 12.2 g/dL (ref 11.6–15.9)
LYMPH%: 10.3 % — AB (ref 14.0–49.7)
MCH: 27.6 pg (ref 25.1–34.0)
MCHC: 34.1 g/dL (ref 31.5–36.0)
MCV: 80.9 fL (ref 79.5–101.0)
MONO#: 0.4 10*3/uL (ref 0.1–0.9)
MONO%: 9.6 % (ref 0.0–14.0)
NEUT%: 60.8 % (ref 38.4–76.8)
NEUTROS ABS: 2.8 10*3/uL (ref 1.5–6.5)
PLATELETS: 146 10*3/uL (ref 145–400)
RBC: 4.42 10*6/uL (ref 3.70–5.45)
RDW: 14.8 % — ABNORMAL HIGH (ref 11.2–14.5)
WBC: 4.7 10*3/uL (ref 3.9–10.3)
lymph#: 0.5 10*3/uL — ABNORMAL LOW (ref 0.9–3.3)

## 2016-07-22 LAB — MAGNESIUM: Magnesium: 2 mg/dl (ref 1.5–2.5)

## 2016-07-22 MED ORDER — SODIUM CHLORIDE 0.9 % IV SOLN
Freq: Once | INTRAVENOUS | Status: AC
  Administered 2016-07-22: 12:00:00 via INTRAVENOUS
  Filled 2016-07-22: qty 5

## 2016-07-22 MED ORDER — SODIUM CHLORIDE 0.9 % IV SOLN
47.5000 mg/m2 | Freq: Once | INTRAVENOUS | Status: AC
  Administered 2016-07-22: 100 mg via INTRAVENOUS
  Filled 2016-07-22: qty 100

## 2016-07-22 MED ORDER — SODIUM CHLORIDE 0.9 % IV SOLN
Freq: Once | INTRAVENOUS | Status: AC
  Administered 2016-07-22: 09:00:00 via INTRAVENOUS

## 2016-07-22 MED ORDER — PALONOSETRON HCL INJECTION 0.25 MG/5ML
INTRAVENOUS | Status: AC
  Filled 2016-07-22: qty 5

## 2016-07-22 MED ORDER — POTASSIUM CHLORIDE 2 MEQ/ML IV SOLN
Freq: Once | INTRAVENOUS | Status: AC
  Administered 2016-07-22: 09:00:00 via INTRAVENOUS
  Filled 2016-07-22: qty 10

## 2016-07-22 MED ORDER — PALONOSETRON HCL INJECTION 0.25 MG/5ML
0.2500 mg | Freq: Once | INTRAVENOUS | Status: AC
  Administered 2016-07-22: 0.25 mg via INTRAVENOUS

## 2016-07-22 NOTE — Patient Instructions (Signed)
[facility] Discharge Instructions for Patients Receiving Chemotherapy  Today you received the following chemotherapy agents: Cisplatin   To help prevent nausea and vomiting after your treatment, we encourage you to take your nausea medication as directed.    If you develop nausea and vomiting that is not controlled by your nausea medication, call the clinic.   BELOW ARE SYMPTOMS THAT SHOULD BE REPORTED IMMEDIATELY:  *FEVER GREATER THAN 100.5 F  *CHILLS WITH OR WITHOUT FEVER  NAUSEA AND VOMITING THAT IS NOT CONTROLLED WITH YOUR NAUSEA MEDICATION  *UNUSUAL SHORTNESS OF BREATH  *UNUSUAL BRUISING OR BLEEDING  TENDERNESS IN MOUTH AND THROAT WITH OR WITHOUT PRESENCE OF ULCERS  *URINARY PROBLEMS  *BOWEL PROBLEMS  UNUSUAL RASH Items with * indicate a potential emergency and should be followed up as soon as possible.  Feel free to call the clinic you have any questions or concerns. The clinic phone number is [phone].  Please show the CHEMO ALERT CARD at check-in to the Emergency Department and triage nurse.   

## 2016-07-22 NOTE — Progress Notes (Signed)
START ON PATHWAY REGIMEN -      

## 2016-07-23 ENCOUNTER — Ambulatory Visit
Admission: RE | Admit: 2016-07-23 | Discharge: 2016-07-23 | Disposition: A | Discharge status: Home / Self Care | Payer: Medicare Other | Source: Ambulatory Visit | Attending: Radiation Oncology | Admitting: Radiation Oncology

## 2016-07-23 ENCOUNTER — Encounter: Payer: Self-pay | Admitting: Radiation Oncology

## 2016-07-23 VITALS — BP 143/77 | HR 85 | Temp 98.6°F | Ht 63.5 in | Wt 214.8 lb

## 2016-07-23 DIAGNOSIS — Z510 Encounter for antineoplastic radiation therapy: Secondary | ICD-10-CM | POA: Diagnosis not present

## 2016-07-23 DIAGNOSIS — C541 Malignant neoplasm of endometrium: Secondary | ICD-10-CM

## 2016-07-23 NOTE — Progress Notes (Signed)
Michelle Rosario has completed 19 fractions to her pelvis. She denies having pain.  She reports having about 3 loose bowel movements per day and is taking 2 Imodium per day.  She reports having nausea and is wondering how to space out her nausea medications.  She can take Zofran and lorazepam every 8 hours and compazine q 6 hours the night after chemo.  Advised her to take a zofran and then if she is still nauseated to take lorazepam 4 hours later and then zofran again 4 hours after.  She denies having any bladder issues or vaginal issues.  She reports having fatigue. She had chemo yesterday.  BP (!) 143/77 (BP Location: Left Arm, Patient Position: Sitting)   Pulse 85   Temp 98.6 F (37 C) (Oral)   Ht 5' 3.5" (1.613 m)   Wt 214 lb 12.8 oz (97.4 kg)   SpO2 98%   BMI 37.45 kg/m    Wt Readings from Last 3 Encounters:  07/23/16 214 lb 12.8 oz (97.4 kg)  07/18/16 215 lb 3.2 oz (97.6 kg)  07/16/16 214 lb 12.8 oz (97.4 kg)

## 2016-07-23 NOTE — Progress Notes (Signed)
  Radiation Oncology         [phone] ________________________________  Name: Michelle Rosario MRN: [id number]  Date: 07/23/2016  DOB: 1945-12-29  Weekly Radiation Therapy Management    ICD-9-CM ICD-10-CM   1. Recurrent carcinoma of endometrium (HCC) 182.0 C54.1     Current Dose: 41.8 Gy     Planned Dose:  55 + Gy  Narrative . . . . . . . . The patient presents for routine under treatment assessment.                                    Michelle Rosario has completed 19 fractions to her pelvis. She denies pain. She reports having about 3 loose bowel movements per day and is taking 2 Imodium per day. She reports having nausea and is wondering how to space out her nausea medications.  She can take Zofran and lorazepam every 8 hours and compazine q 6 hours the night after chemo.  The nurse advised her to take a zofran and then if she is still nauseated to take lorazepam 4 hours later and then zofran again 4 hours after. The patient denies emesis. She denies having any bladder or vaginal issues.  She reports having fatigue. She had chemo yesterday.                                  Set-up films were reviewed.                                 The chart was checked. Physical Findings. . .  height is 5' 3.5" (1.613 m) and weight is 214 lb 12.8 oz (97.4 kg). Her oral temperature is 98.6 F (37 C). Her blood pressure is 143/77 (abnormal) and her pulse is 85. Her oxygen saturation is 98%. . Weight essentially stable. Lungs are clear to auscultation bilaterally. Heart has regular rate and rhythm. Abdomen soft, non-tender, normal bowel sounds. Impression . . . . . . . The patient is tolerating radiation, but her main complaint is nausea. Plan . . . . . . . . . . . . Continue treatment as planned. The patient states she will have dental work on 08/13/16. I advised her to speak with Dr. Marko Plume about this since she is on chemo. From a radiation standpoint, the dental work would have no effect. The patient is to  alternate her nausea medication every 4 hours for relief. ________________________________   Blair Promise, PhD, MD  This document serves as a record of services personally performed by Gery Pray, MD. It was created on his behalf by Darcus Austin, a trained medical scribe. The creation of this record is based on the scribe's personal observations and the provider's statements to them. This document has been checked and approved by the attending provider.

## 2016-07-24 ENCOUNTER — Ambulatory Visit: Payer: Medicare Other

## 2016-07-25 ENCOUNTER — Encounter: Payer: Self-pay | Admitting: Oncology

## 2016-07-25 ENCOUNTER — Ambulatory Visit
Admission: RE | Admit: 2016-07-25 | Discharge: 2016-07-25 | Disposition: A | Discharge status: Home / Self Care | Payer: Medicare Other | Source: Ambulatory Visit | Attending: Radiation Oncology | Admitting: Radiation Oncology

## 2016-07-25 ENCOUNTER — Other Ambulatory Visit: Payer: Self-pay | Admitting: Oncology

## 2016-07-25 DIAGNOSIS — Z510 Encounter for antineoplastic radiation therapy: Secondary | ICD-10-CM | POA: Diagnosis not present

## 2016-07-25 NOTE — Progress Notes (Signed)
Medical Oncology  Orders for carbo taxol every 3 weeks entered beginning 08-15-16, these to be signed by Dr Alvy Bimler. Message to [facility] managed care for preauth, including aloxi and emend used in [place]. She will have teaching at appointment with this MD on 08-01-16. Granix is already authorized.   Scheduling message sent:  "first [place] long taxol 08-15-16 + lab [place] long taxol 3-1 + lab Carbo long taxol 3-22 + lab Carbo long taxol 4-12 + lab Please let Dr Alvy Bimler know these dates, for her first appointment to be set up in Feb"  L.Creasie Lacosse

## 2016-07-26 ENCOUNTER — Ambulatory Visit
Admission: RE | Admit: 2016-07-26 | Discharge: 2016-07-26 | Disposition: A | Discharge status: Home / Self Care | Payer: Medicare Other | Source: Ambulatory Visit | Attending: Radiation Oncology | Admitting: Radiation Oncology

## 2016-07-26 DIAGNOSIS — Z510 Encounter for antineoplastic radiation therapy: Secondary | ICD-10-CM | POA: Diagnosis not present

## 2016-07-27 ENCOUNTER — Other Ambulatory Visit: Payer: Self-pay | Admitting: Oncology

## 2016-07-27 ENCOUNTER — Telehealth: Payer: Self-pay | Admitting: Oncology

## 2016-07-27 DIAGNOSIS — C541 Malignant neoplasm of endometrium: Secondary | ICD-10-CM

## 2016-07-27 NOTE — Telephone Encounter (Signed)
Spoke with patient confirm 1/22 lab/LL and also 2/6 and 2/6. Patient aware LL will given information re transition plan on 1/22.

## 2016-07-29 ENCOUNTER — Encounter: Payer: Self-pay | Admitting: Oncology

## 2016-07-29 ENCOUNTER — Ambulatory Visit
Admission: RE | Admit: 2016-07-29 | Discharge: 2016-07-29 | Disposition: A | Discharge status: Home / Self Care | Payer: Medicare Other | Source: Ambulatory Visit | Attending: Radiation Oncology | Admitting: Radiation Oncology

## 2016-07-29 ENCOUNTER — Other Ambulatory Visit (HOSPITAL_BASED_OUTPATIENT_CLINIC_OR_DEPARTMENT_OTHER): Payer: Medicare Other

## 2016-07-29 ENCOUNTER — Telehealth: Payer: Self-pay

## 2016-07-29 ENCOUNTER — Ambulatory Visit (HOSPITAL_BASED_OUTPATIENT_CLINIC_OR_DEPARTMENT_OTHER): Payer: Medicare Other | Admitting: Oncology

## 2016-07-29 VITALS — BP 146/72 | HR 80 | Temp 97.9°F | Resp 17 | Ht 63.5 in | Wt 210.4 lb

## 2016-07-29 DIAGNOSIS — R300 Dysuria: Secondary | ICD-10-CM

## 2016-07-29 DIAGNOSIS — C541 Malignant neoplasm of endometrium: Secondary | ICD-10-CM | POA: Diagnosis present

## 2016-07-29 DIAGNOSIS — K520 Gastroenteritis and colitis due to radiation: Secondary | ICD-10-CM

## 2016-07-29 DIAGNOSIS — Z510 Encounter for antineoplastic radiation therapy: Secondary | ICD-10-CM | POA: Diagnosis not present

## 2016-07-29 DIAGNOSIS — K0889 Other specified disorders of teeth and supporting structures: Secondary | ICD-10-CM

## 2016-07-29 DIAGNOSIS — Z5111 Encounter for antineoplastic chemotherapy: Secondary | ICD-10-CM

## 2016-07-29 LAB — CBC WITH DIFFERENTIAL/PLATELET
BASO%: 0.6 % (ref 0.0–2.0)
BASOS ABS: 0 10*3/uL (ref 0.0–0.1)
EOS ABS: 0.5 10*3/uL (ref 0.0–0.5)
EOS%: 7.4 % — ABNORMAL HIGH (ref 0.0–7.0)
HEMATOCRIT: 34.6 % — AB (ref 34.8–46.6)
HEMOGLOBIN: 11.9 g/dL (ref 11.6–15.9)
LYMPH#: 0.4 10*3/uL — AB (ref 0.9–3.3)
LYMPH%: 5.8 % — ABNORMAL LOW (ref 14.0–49.7)
MCH: 27.7 pg (ref 25.1–34.0)
MCHC: 34.4 g/dL (ref 31.5–36.0)
MCV: 80.7 fL (ref 79.5–101.0)
MONO#: 0.7 10*3/uL (ref 0.1–0.9)
MONO%: 11.9 % (ref 0.0–14.0)
NEUT#: 4.6 10*3/uL (ref 1.5–6.5)
NEUT%: 74.3 % (ref 38.4–76.8)
Platelets: 131 10*3/uL — ABNORMAL LOW (ref 145–400)
RBC: 4.29 10*6/uL (ref 3.70–5.45)
RDW: 14.6 % — AB (ref 11.2–14.5)
WBC: 6.2 10*3/uL (ref 3.9–10.3)

## 2016-07-29 LAB — COMPREHENSIVE METABOLIC PANEL
ALBUMIN: 3.4 g/dL — AB (ref 3.5–5.0)
ALK PHOS: 137 U/L (ref 40–150)
ALT: 36 U/L (ref 0–55)
ANION GAP: 8 meq/L (ref 3–11)
AST: 21 U/L (ref 5–34)
BUN: 16.8 mg/dL (ref 7.0–26.0)
CALCIUM: 9.7 mg/dL (ref 8.4–10.4)
CHLORIDE: 106 meq/L (ref 98–109)
CO2: 28 mEq/L (ref 22–29)
CREATININE: 0.8 mg/dL (ref 0.6–1.1)
EGFR: 80 mL/min/{1.73_m2} — ABNORMAL LOW (ref >90)
Glucose: 102 mg/dl (ref 70–140)
POTASSIUM: 4.1 meq/L (ref 3.5–5.1)
Sodium: 141 mEq/L (ref 136–145)
Total Bilirubin: 0.38 mg/dL (ref 0.20–1.20)
Total Protein: 6.8 g/dL (ref 6.4–8.3)

## 2016-07-29 LAB — MAGNESIUM: Magnesium: 1.8 mg/dl (ref 1.5–2.5)

## 2016-07-29 MED ORDER — CHLORHEXIDINE GLUCONATE 0.12% ORAL RINSE (MEDLINE KIT): 15.0000 mL | Freq: Two times a day (BID) | OROMUCOSAL | 0 refills | Status: DC

## 2016-07-29 MED ORDER — NYSTATIN 100000 UNIT/GM EX POWD: CUTANEOUS | 1 refills | Status: DC

## 2016-07-29 MED ORDER — CHLORHEXIDINE GLUCONATE 0.12 % MT SOLN: 15.0000 mL | Freq: Two times a day (BID) | OROMUCOSAL | 0 refills | Status: DC

## 2016-07-29 MED ORDER — CHLORHEXIDINE GLUCONATE 0.12% ORAL RINSE (MEDLINE KIT): 15.0000 mL | Freq: Two times a day (BID) | OROMUCOSAL | 1 refills | Status: DC

## 2016-07-29 MED ORDER — DEXAMETHASONE 4 MG PO TABS: ORAL_TABLET | ORAL | 0 refills | Status: DC

## 2016-07-29 NOTE — Telephone Encounter (Signed)
Faxed note and lab work from 07-29-16 to [place] as rewuested by Dr. Marko Plume.
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
## 2016-07-30 ENCOUNTER — Ambulatory Visit: Payer: Medicare Other

## 2016-07-30 ENCOUNTER — Ambulatory Visit
Admission: RE | Admit: 2016-07-30 | Discharge: 2016-07-30 | Disposition: A | Discharge status: Home / Self Care | Payer: Medicare Other | Source: Ambulatory Visit | Attending: Radiation Oncology | Admitting: Radiation Oncology

## 2016-07-30 ENCOUNTER — Other Ambulatory Visit (HOSPITAL_BASED_OUTPATIENT_CLINIC_OR_DEPARTMENT_OTHER): Payer: Medicare Other

## 2016-07-30 ENCOUNTER — Encounter: Payer: Self-pay | Admitting: Oncology

## 2016-07-30 ENCOUNTER — Encounter: Payer: Self-pay | Admitting: Radiation Oncology

## 2016-07-30 VITALS — BP 137/74 | HR 81 | Temp 98.9°F | Ht 63.5 in | Wt 208.0 lb

## 2016-07-30 DIAGNOSIS — R300 Dysuria: Secondary | ICD-10-CM | POA: Diagnosis not present

## 2016-07-30 DIAGNOSIS — C541 Malignant neoplasm of endometrium: Secondary | ICD-10-CM

## 2016-07-30 DIAGNOSIS — Z510 Encounter for antineoplastic radiation therapy: Secondary | ICD-10-CM | POA: Diagnosis not present

## 2016-07-30 LAB — URINALYSIS, MICROSCOPIC - CHCC
Bilirubin (Urine): NEGATIVE
GLUCOSE UR CHCC: NEGATIVE mg/dL
Ketones: NEGATIVE mg/dL
NITRITE: NEGATIVE
PH: 6 (ref 4.6–8.0)
Protein: 30 mg/dL
SPECIFIC GRAVITY, URINE: 1.025 (ref 1.003–1.035)
UROBILINOGEN UR: 0.2 mg/dL (ref 0.2–1)

## 2016-07-30 NOTE — Progress Notes (Signed)
Michelle Rosario has completed 23 fractions to her pelvis.  She reports having dysuria and reports she had a urinalysis/culture done today.  She denies having hematuria or vaginal bleeding.  She continues to have stomach cramps after eating.  She reports having 2-3 episodes of diarrhea per day and is taking 3 tablets of Imodium per day.  She reports having fatigue.  She denies having any skin irritation.  She is wondering about scheduling the HDR treatments.  BP 137/74 (BP Location: Left Arm, Patient Position: Sitting)   Pulse 81   Temp 98.9 F (37.2 C) (Oral)   Ht 5' 3.5" (1.613 m)   Wt 208 lb (94.3 kg)   SpO2 99%   BMI 36.27 kg/m    Wt Readings from Last 3 Encounters:  07/30/16 208 lb (94.3 kg)  07/29/16 210 lb 6.4 oz (95.4 kg)  07/23/16 214 lb 12.8 oz (97.4 kg)

## 2016-07-30 NOTE — Progress Notes (Signed)
  Radiation Oncology         [phone]) [phone] ________________________________  Name: Michelle Rosario MRN: [id number]  Date: 07/30/2016  DOB: [birth year]  Weekly Radiation Therapy Management    ICD-9-CM ICD-10-CM   1. Recurrent carcinoma of endometrium (HCC) 182.0 C54.1     Current Dose: 50.6 Gy     Planned Dose:  55 + Gy  Narrative . . . . . . . . The patient presents for routine under treatment assessment.                                    [name] has completed 23 fractions to her pelvis. She reports dysuria and had an urinalysis/culture done today. She denies having hematuria or vaginal bleeding. She continues to have stomach cramps after eating. She reports having 2-3 episodes of diarrhea per day and is taking 3 tablets of Imodium per day. The patient still has nausea. She reports having fatigue. She denies having any skin irritation.  She is wondering about scheduling the HDR treatments.                                  Set-up films were reviewed.                                 The chart was checked. Physical Findings. . .  height is 5' 3.5" (1.613 m) and weight is 208 lb (94.3 kg). Her oral temperature is 98.9 F (37.2 C). Her blood pressure is 137/74 and her pulse is 81. Her oxygen saturation is 99%. . Weight essentially stable. Lungs are clear to auscultation bilaterally. Heart has regular rate and rhythm. Abdomen soft, non-tender, normal bowel sounds. Impression . . . . . . . The patient is tolerating radiation. Plan . . . . . . . . . . . . Continue treatment as planned. I explained the process of HDR brachytherapy. We will call her of the schedule. Urine culture pending. The patient starts carbo/taxol on 08/15/16 and we will try to get one treatment of HDR complete before then. ________________________________   Blair Promise, PhD, MD  This document serves as a record of services personally performed by Gery Pray, MD. It was created on his behalf by Darcus Austin, a trained  medical scribe. The creation of this record is based on the scribe's personal observations and the provider's statements to them. This document has been checked and approved by the attending provider.

## 2016-07-30 NOTE — Progress Notes (Signed)
[place] END OF TREATMENT   Name: Michelle Rosario Date: July 30, 2016  MRN: [id number] DOB: [birth year]   TREATMENT DATES: 06-24-16 and 07-22-16   REFERRING PHYSICIAN: D.ClarkePearson  DIAGNOSIS: recurrent endometrial cancer  STAGE AT START OF TREATMENT: recurrent IA grade 2   INTENT: curative   DRUGS OR REGIMENS GIVEN: cisplatinum , given day 1 and day 28 radiation   MAJOR TOXICITIES: nausea   REASON TREATMENT STOPPED: completion of planned course   PERFORMANCE STATUS AT END: 1    ONGOING PROBLEMS: radiation diarrhea, nausea   FOLLOW UP PLANS: carboplatin taxol x 4 cycles after completion of radiation

## 2016-07-31 ENCOUNTER — Ambulatory Visit: Payer: Medicare Other

## 2016-07-31 ENCOUNTER — Ambulatory Visit
Admission: RE | Admit: 2016-07-31 | Discharge: 2016-07-31 | Disposition: A | Discharge status: Home / Self Care | Payer: Medicare Other | Source: Ambulatory Visit | Attending: Radiation Oncology | Admitting: Radiation Oncology

## 2016-07-31 DIAGNOSIS — Z510 Encounter for antineoplastic radiation therapy: Secondary | ICD-10-CM | POA: Diagnosis not present

## 2016-08-01 ENCOUNTER — Ambulatory Visit: Payer: Medicare Other

## 2016-08-01 ENCOUNTER — Ambulatory Visit
Admission: RE | Admit: 2016-08-01 | Discharge: 2016-08-01 | Disposition: A | Discharge status: Home / Self Care | Payer: Medicare Other | Source: Ambulatory Visit | Attending: Radiation Oncology | Admitting: Radiation Oncology

## 2016-08-01 ENCOUNTER — Telehealth: Payer: Self-pay

## 2016-08-01 DIAGNOSIS — Z510 Encounter for antineoplastic radiation therapy: Secondary | ICD-10-CM | POA: Diagnosis not present

## 2016-08-01 DIAGNOSIS — R300 Dysuria: Secondary | ICD-10-CM

## 2016-08-01 LAB — URINE CULTURE

## 2016-08-01 MED ORDER — CEPHALEXIN 500 MG PO CAPS: 500.0000 mg | ORAL_CAPSULE | Freq: Two times a day (BID) | ORAL | 0 refills | Status: DC

## 2016-08-01 NOTE — Telephone Encounter (Signed)
S/w pt about + urine culture, keflex ordered. Discussed hygeine and hydration.  She was able to move dental appt to Monday so appt with Dr Alvy Bimler does not need to be moved.

## 2016-08-01 NOTE — Telephone Encounter (Signed)
-----   Message from Gordy Levan, MD sent at 07/30/2016  9:00 PM EST ----- Labs seen and need follow up: UA not clearly infected, need to follow up culture when resulted

## 2016-08-02 ENCOUNTER — Telehealth: Payer: Self-pay

## 2016-08-02 DIAGNOSIS — C541 Malignant neoplasm of endometrium: Secondary | ICD-10-CM

## 2016-08-02 MED ORDER — PROCHLORPERAZINE MALEATE 10 MG PO TABS: 10.0000 mg | ORAL_TABLET | Freq: Four times a day (QID) | ORAL | 0 refills | Status: DC | PRN

## 2016-08-02 NOTE — Telephone Encounter (Signed)
Incoming fax for compazine refill, done per protocol

## 2016-08-06 ENCOUNTER — Telehealth: Payer: Self-pay | Admitting: Oncology

## 2016-08-06 NOTE — Telephone Encounter (Signed)
Michelle Rosario called and said she has a UTI and has two days left of her antibiotics.  She is wondering if it will be OK to have brachytherapy treatments.  Advised her that it will be OK to have radiation and let her know that her next appointment will be on 08/13/16 at 9 am.  Michelle Rosario verbalized understanding and agreement.

## 2016-08-12 ENCOUNTER — Telehealth: Payer: Self-pay | Admitting: *Deleted

## 2016-08-12 NOTE — Telephone Encounter (Signed)
CALLED PATIENT TO REMIND OF HDR [place] FOR 08-13-16, LVM FOR A RETURN CALL

## 2016-08-13 ENCOUNTER — Ambulatory Visit
Admission: RE | Admit: 2016-08-13 | Discharge: 2016-08-13 | Disposition: A | Discharge status: Home / Self Care | Payer: Medicare Other | Source: Ambulatory Visit | Attending: Radiation Oncology | Admitting: Radiation Oncology

## 2016-08-13 ENCOUNTER — Telehealth: Payer: Self-pay | Admitting: Hematology and Oncology

## 2016-08-13 ENCOUNTER — Other Ambulatory Visit (HOSPITAL_BASED_OUTPATIENT_CLINIC_OR_DEPARTMENT_OTHER): Payer: Medicare Other

## 2016-08-13 ENCOUNTER — Encounter: Payer: Self-pay | Admitting: Radiation Oncology

## 2016-08-13 ENCOUNTER — Encounter: Payer: Self-pay | Admitting: Hematology and Oncology

## 2016-08-13 ENCOUNTER — Ambulatory Visit (HOSPITAL_BASED_OUTPATIENT_CLINIC_OR_DEPARTMENT_OTHER): Payer: Medicare Other | Admitting: Hematology and Oncology

## 2016-08-13 ENCOUNTER — Ambulatory Visit (HOSPITAL_BASED_OUTPATIENT_CLINIC_OR_DEPARTMENT_OTHER)
Admission: RE | Admit: 2016-08-13 | Discharge: 2016-08-13 | Disposition: A | Discharge status: Home / Self Care | Payer: Medicare Other | Source: Ambulatory Visit | Attending: Radiation Oncology | Admitting: Radiation Oncology

## 2016-08-13 VITALS — BP 147/73 | HR 74 | Temp 97.7°F | Resp 16 | Ht 63.5 in | Wt 213.9 lb

## 2016-08-13 DIAGNOSIS — C7982 Secondary malignant neoplasm of genital organs: Secondary | ICD-10-CM | POA: Diagnosis not present

## 2016-08-13 DIAGNOSIS — Z79899 Other long term (current) drug therapy: Secondary | ICD-10-CM | POA: Insufficient documentation

## 2016-08-13 DIAGNOSIS — L986 Other infiltrative disorders of the skin and subcutaneous tissue: Secondary | ICD-10-CM

## 2016-08-13 DIAGNOSIS — Z510 Encounter for antineoplastic radiation therapy: Secondary | ICD-10-CM | POA: Diagnosis present

## 2016-08-13 DIAGNOSIS — I10 Essential (primary) hypertension: Secondary | ICD-10-CM | POA: Diagnosis not present

## 2016-08-13 DIAGNOSIS — C541 Malignant neoplasm of endometrium: Secondary | ICD-10-CM

## 2016-08-13 DIAGNOSIS — C7989 Secondary malignant neoplasm of other specified sites: Secondary | ICD-10-CM | POA: Diagnosis not present

## 2016-08-13 DIAGNOSIS — R300 Dysuria: Secondary | ICD-10-CM

## 2016-08-13 DIAGNOSIS — D61818 Other pancytopenia: Secondary | ICD-10-CM | POA: Diagnosis not present

## 2016-08-13 DIAGNOSIS — T801XXA Vascular complications following infusion, transfusion and therapeutic injection, initial encounter: Secondary | ICD-10-CM

## 2016-08-13 LAB — URINALYSIS, MICROSCOPIC - CHCC
BILIRUBIN (URINE): NEGATIVE
GLUCOSE UR CHCC: NEGATIVE mg/dL
KETONES: NEGATIVE mg/dL
Leukocyte Esterase: NEGATIVE
Nitrite: NEGATIVE
Specific Gravity, Urine: 1.02 (ref 1.003–1.035)
Urobilinogen, UR: 0.2 mg/dL (ref 0.2–1)
pH: 6.5 (ref 4.6–8.0)

## 2016-08-13 LAB — COMPREHENSIVE METABOLIC PANEL
ALT: 18 U/L (ref 0–55)
AST: 15 U/L (ref 5–34)
Albumin: 3.4 g/dL — ABNORMAL LOW (ref 3.5–5.0)
Alkaline Phosphatase: 144 U/L (ref 40–150)
Anion Gap: 8 mEq/L (ref 3–11)
BUN: 22.3 mg/dL (ref 7.0–26.0)
CHLORIDE: 104 meq/L (ref 98–109)
CO2: 28 meq/L (ref 22–29)
Calcium: 9.2 mg/dL (ref 8.4–10.4)
Creatinine: 0.7 mg/dL (ref 0.6–1.1)
EGFR: 83 mL/min/{1.73_m2} — ABNORMAL LOW (ref >90)
GLUCOSE: 120 mg/dL (ref 70–140)
POTASSIUM: 3.7 meq/L (ref 3.5–5.1)
SODIUM: 140 meq/L (ref 136–145)
Total Bilirubin: 0.55 mg/dL (ref 0.20–1.20)
Total Protein: 6.4 g/dL (ref 6.4–8.3)

## 2016-08-13 LAB — CBC WITH DIFFERENTIAL/PLATELET
BASO%: 0.9 % (ref 0.0–2.0)
BASOS ABS: 0 10*3/uL (ref 0.0–0.1)
EOS%: 6.1 % (ref 0.0–7.0)
Eosinophils Absolute: 0.2 10*3/uL (ref 0.0–0.5)
HCT: 30.8 % — ABNORMAL LOW (ref 34.8–46.6)
HGB: 10.4 g/dL — ABNORMAL LOW (ref 11.6–15.9)
LYMPH%: 33.5 % (ref 14.0–49.7)
MCH: 28 pg (ref 25.1–34.0)
MCHC: 33.6 g/dL (ref 31.5–36.0)
MCV: 83.2 fL (ref 79.5–101.0)
MONO#: 0.3 10*3/uL (ref 0.1–0.9)
MONO%: 10.5 % (ref 0.0–14.0)
NEUT#: 1.6 10*3/uL (ref 1.5–6.5)
NEUT%: 49 % (ref 38.4–76.8)
Platelets: 186 10*3/uL (ref 145–400)
RBC: 3.7 10*6/uL (ref 3.70–5.45)
RDW: 17.2 % — AB (ref 11.2–14.5)
WBC: 3.3 10*3/uL — ABNORMAL LOW (ref 3.9–10.3)
lymph#: 1.1 10*3/uL (ref 0.9–3.3)

## 2016-08-13 MED ORDER — ONDANSETRON HCL 8 MG PO TABS: 8.0000 mg | ORAL_TABLET | Freq: Three times a day (TID) | ORAL | 1 refills | Status: DC | PRN

## 2016-08-13 MED ORDER — CEPHALEXIN 500 MG PO CAPS: 500.0000 mg | ORAL_CAPSULE | Freq: Two times a day (BID) | ORAL | 0 refills | Status: DC

## 2016-08-13 NOTE — Progress Notes (Signed)
Please see the Nurse Progress Note in the MD Initial Consult Encounter for this patient. 

## 2016-08-13 NOTE — Progress Notes (Signed)
Michelle Rosario is here for follow up/hdr.  She denies having pain.  She had a UTI last week and has finished taking Keflex.  She denies having any urinary issues today.  She denies having any diarrhea or vaginal bleeding.  She reports her energy level is improving.  She will have chemo on Thursday.  BP (!) 158/85 (BP Location: Left Arm, Patient Position: Sitting)   Pulse 94   Temp 98.3 F (36.8 C) (Oral)   Ht 5' 3.5" (1.613 m)   Wt 211 lb 9.6 oz (96 kg)   SpO2 98%   BMI 36.90 kg/m    Wt Readings from Last 3 Encounters:  08/13/16 211 lb 9.6 oz (96 kg)  07/30/16 208 lb (94.3 kg)  07/29/16 210 lb 6.4 oz (95.4 kg)

## 2016-08-13 NOTE — Progress Notes (Signed)
  Radiation Oncology         [phone] ________________________________  Name: Michelle Rosario MRN: [id number]  Date: 08/13/2016  DOB: [birth year]  SIMULATION AND TREATMENT PLANNING NOTE HDR BRACHYTHERAPY  DIAGNOSIS:  FIGO stage IA (pT1a, pN0) grade 2 endometrial adenocarcinoma, now with vaginal and pelvic recurrence  NARRATIVE:  The patient was brought to the [place] suite.  Identity was confirmed.  All relevant records and images related to the planned course of therapy were reviewed.  The patient freely provided informed written consent to proceed with treatment after reviewing the details related to the planned course of therapy. The consent form was witnessed and verified by the simulation staff.  Then, the patient was set-up in a stable reproducible  supine position for radiation therapy.  CT images were obtained.  Surface markings were placed.  The CT images were loaded into the planning software.  Then the target and avoidance structures were contoured.  Treatment planning then occurred.  The radiation prescription was entered and confirmed.   I have requested : Brachytherapy Isodose Plan and Dosimetry Calculations to plan the radiation distribution.  PLAN:  The patient will receive 24 Gy in 4 fractions. Patient will be treated with iridium 192 as the high-dose-rate source. She'll be treated with a 3 cm diameter cylinder with a prescription length of 4 cm. 6 Pearline Cables will be delivered to the mucosal surface.  ________________________________  Blair Promise, PhD, MD  This document serves as a record of services personally performed by Gery Pray, MD. It was created on his behalf by Darcus Austin, a trained medical scribe. The creation of this record is based on the scribe's personal observations and the provider's statements to them. This document has been checked and approved by the attending provider.

## 2016-08-13 NOTE — Progress Notes (Signed)
  Radiation Oncology         [phone] ________________________________  Name: Michelle Rosario MRN: [id number]  Date: 08/13/2016  DOB: 03-17-1946  CC: Woody Seller, MD  [name], MD  HDR BRACHYTHERAPY NOTE  DIAGNOSIS: FIGO stage IA (pT1a, pN0) grade 2 endometrial adenocarcinoma, now with vaginal and pelvic recurrence   Simple treatment device note: Patient had construction of her custom vaginal cylinder. She will be treated with a 3 cm diameter segmented cylinder. This conforms to her anatomy without undue discomfort.  Vaginal brachytherapy procedure node: The patient was brought to the [place] suite. Identity was confirmed. All relevant records and images related to the planned course of therapy were reviewed. The patient freely provided informed written consent to proceed with treatment after reviewing the details related to the planned course of therapy. The consent form was witnessed and verified by the simulation staff. Then, the patient was set-up in a stable reproducible supine position for radiation therapy. The patient's custom vaginal cylinder was placed in the proximal vagina. This was affixed to the CT/MR stabilization plate to prevent slippage. Patient tolerated the placement well.  Verification simulation note:  A fiducial marker was placed within the vaginal cylinder. An AP and lateral film was then obtained through the pelvis area. This documented accurate position of the vaginal cylinder for treatment.  HDR BRACHYTHERAPY TREATMENT  The remote afterloading device was affixed to the vaginal cylinder by catheter. Patient then proceeded to undergo her first high-dose-rate treatment directed at the proximal vagina. The patient was prescribed a dose of 6 gray to be delivered to the mucosal surface. Treatment length was 4 cm. Patient was treated with 1 channel using 9 dwell positions. Treatment time was 257.8 seconds. Iridium 192 was the high-dose-rate source for treatment. The  patient tolerated the treatment well. After completion of her therapy, a radiation survey was performed documenting return of the iridium source into the [employer] safe.   PLAN: The patient will return for her second HDR treatment on 08/20/16. ________________________________  Blair Promise, PhD, MD   This document serves as a record of services personally performed by Gery Pray, MD. It was created on his behalf by Darcus Austin, a trained medical scribe. The creation of this record is based on the scribe's personal observations and the provider's statements to them. This document has been checked and approved by the attending provider.

## 2016-08-13 NOTE — Telephone Encounter (Signed)
Message given to Michelle Rosario for override, to add follow up appointments, per 08/13/16 los. Patient was given a copy of the AVS report per 08/13/16 los.

## 2016-08-13 NOTE — Progress Notes (Signed)
Radiation Oncology         [phone] ________________________________  Name: Michelle Rosario MRN: [id number]  Date: 08/13/2016  DOB: Nov 30, 1945  Vaginal Brachytherapy Procedure Note  CC: Woody Seller, MD [name], MD    ICD-9-CM ICD-10-CM   1. Recurrent carcinoma of endometrium (HCC) 182.0 C54.1     Diagnosis: FIGO stage IA (pT1a, pN0) grade 2 endometrial adenocarcinoma, now with vaginal and pelvic recurrence  Radiation Treatment Dates: 06/24/16 - 08/01/16 Pelvis: 45 Gy in 25 fractions.  Narrative: She returns today for vaginal cylinder fitting. She denies having pain. She had a UTI last week and has finished taking Keflex. She denies having any urinary issues today and she denies having any diarrhea or vaginal bleeding.  She reports her energy level is improving.  She will have chemo on Thursday.  ALLERGIES: is allergic to latex.  Meds: Current Outpatient Prescriptions  Medication Sig Dispense Refill  . ALPRAZolam (XANAX) 1 MG tablet Take 1/2 to one tablet at bedtime as needed for insomnia and restless legs.    . B Complex Vitamins (VITAMIN B COMPLEX) TABS Take 1 tablet by mouth daily.     . Calcium Carbonate-Vitamin D (CALTRATE COLON HEALTH PO) Take 1 tablet by mouth daily.    . chlorhexidine (PERIDEX) 0.12 % solution Use as directed 15 mLs in the mouth or throat 2 (two) times daily. 120 mL 0  . clonazePAM (KLONOPIN) 0.5 MG tablet Take 0.5 mg by mouth daily as needed (restless legs).    Marland Kitchen dexamethasone (DECADRON) 4 MG tablet Take 5 tablets with food 12 hrs and 6 hrs prior to Taxol chemotherapy. 10 tablet 0  . fluticasone (FLONASE) 50 MCG/ACT nasal spray Place 1 spray into both nostrils 2 (two) times daily as needed.  1  . gabapentin (NEURONTIN) 600 MG tablet Take 600 mg by mouth at bedtime.     Marland Kitchen levothyroxine (SYNTHROID, LEVOTHROID) 75 MCG tablet Take 75 mcg by mouth daily before breakfast.     . loperamide (IMODIUM) 2 MG capsule Take by mouth as needed for diarrhea  or loose stools.    Marland Kitchen LORazepam (ATIVAN) 1 MG tablet Place 1 tablet (1 mg total) under the tongue every 8 (eight) hours as needed (nausea). 30 tablet 0  . losartan-hydrochlorothiazide (HYZAAR) 100-12.5 MG tablet Take 1 tablet by mouth daily.    . magnesium 30 MG tablet Take 30 mg by mouth daily.     . Melatonin 3 MG TBDP Take 3 mg by mouth at bedtime.     . Multiple Vitamin (MULTIVITAMIN WITH MINERALS) TABS tablet Take 1 tablet by mouth daily.    Marland Kitchen nystatin (MYCOSTATIN/NYSTOP) powder Apply to affected area  twice a day as directed. 15 g 1  . Potassium (POTASSIMIN) 75 MG TABS Take 75 mg by mouth daily.    . prochlorperazine (COMPAZINE) 10 MG tablet Take 1 tablet (10 mg total) by mouth every 6 (six) hours as needed for nausea. 20 tablet 0  . traZODone (DESYREL) 50 MG tablet Take 50 mg by mouth at bedtime.     . cephALEXin (KEFLEX) 500 MG capsule Take 1 capsule (500 mg total) by mouth 2 (two) times daily. For 7 days 14 capsule 0  . ondansetron (ZOFRAN) 8 MG tablet Take 1 tablet (8 mg total) by mouth every 8 (eight) hours as needed for nausea. 60 tablet 1   No current facility-administered medications for this encounter.     Physical Findings: The patient is in no acute distress.  Patient is alert and oriented.  height is 5' 3.5" (1.613 m) and weight is 211 lb 9.6 oz (96 kg). Her oral temperature is 98.3 F (36.8 C). Her blood pressure is 158/85 (abnormal) and her pulse is 94. Her oxygen saturation is 98%.   On pelvic examination the external genitalia were unremarkable. A speculum exam was performed. Vaginal cuff intact, no mucosal lesions. On bimanual exam there were no pelvic masses appreciated.  Patient was fitted for a vaginal cylinder. The patient will be treated with a 3 cm diameter cylinder with a treatment length of 4 cm. This distended the vaginal vault without undue discomfort. The patient tolerated the procedure well.  Lab Findings: Lab Results  Component Value Date   WBC 3.3 (L)  08/13/2016   HGB 10.4 (L) 08/13/2016   HCT 30.8 (L) 08/13/2016   MCV 83.2 08/13/2016   PLT 186 08/13/2016    Radiographic Findings: No results found.  Impression: FIGO stage IA (pT1a, pN0) grade 2 endometrial adenocarcinoma, now with vaginal and pelvic recurrence  The patient was successfully fitted for a vaginal cylinder. The patient is appropriate to begin vaginal brachytherapy.    Plan: The patient will proceed with CT simulation after this encounter and vaginal brachytherapy later today.  _______________________________   Blair Promise, PhD, MD  This document serves as a record of services personally performed by Gery Pray, MD. It was created on his behalf by Darcus Austin, a trained medical scribe. The creation of this record is based on the scribe's personal observations and the provider's statements to them. This document has been checked and approved by the attending provider.

## 2016-08-14 LAB — URINE CULTURE

## 2016-08-15 ENCOUNTER — Ambulatory Visit (HOSPITAL_BASED_OUTPATIENT_CLINIC_OR_DEPARTMENT_OTHER): Payer: Medicare Other

## 2016-08-15 ENCOUNTER — Other Ambulatory Visit: Payer: Self-pay | Admitting: Hematology and Oncology

## 2016-08-15 ENCOUNTER — Encounter: Payer: Self-pay | Admitting: Radiation Oncology

## 2016-08-15 ENCOUNTER — Encounter: Payer: Self-pay | Admitting: Hematology and Oncology

## 2016-08-15 ENCOUNTER — Other Ambulatory Visit: Payer: Medicare Other

## 2016-08-15 VITALS — BP 133/76 | HR 67 | Temp 98.0°F | Resp 18

## 2016-08-15 DIAGNOSIS — D61818 Other pancytopenia: Secondary | ICD-10-CM | POA: Insufficient documentation

## 2016-08-15 DIAGNOSIS — C541 Malignant neoplasm of endometrium: Secondary | ICD-10-CM

## 2016-08-15 DIAGNOSIS — Z5111 Encounter for antineoplastic chemotherapy: Secondary | ICD-10-CM

## 2016-08-15 DIAGNOSIS — Z5189 Encounter for other specified aftercare: Secondary | ICD-10-CM

## 2016-08-15 DIAGNOSIS — T801XXA Vascular complications following infusion, transfusion and therapeutic injection, initial encounter: Secondary | ICD-10-CM | POA: Insufficient documentation

## 2016-08-15 LAB — CBC WITH DIFFERENTIAL/PLATELET
BASO%: 0.3 % (ref 0.0–2.0)
BASOS ABS: 0 10*3/uL (ref 0.0–0.1)
EOS%: 0 % (ref 0.0–7.0)
Eosinophils Absolute: 0 10*3/uL (ref 0.0–0.5)
HCT: 34 % — ABNORMAL LOW (ref 34.8–46.6)
HGB: 11.7 g/dL (ref 11.6–15.9)
LYMPH%: 15.4 % (ref 14.0–49.7)
MCH: 28.5 pg (ref 25.1–34.0)
MCHC: 34.4 g/dL (ref 31.5–36.0)
MCV: 82.9 fL (ref 79.5–101.0)
MONO#: 0 10*3/uL — AB (ref 0.1–0.9)
MONO%: 0.8 % (ref 0.0–14.0)
NEUT#: 3.2 10*3/uL (ref 1.5–6.5)
NEUT%: 83.5 % — ABNORMAL HIGH (ref 38.4–76.8)
PLATELETS: 200 10*3/uL (ref 145–400)
RBC: 4.1 10*6/uL (ref 3.70–5.45)
RDW: 16 % — ABNORMAL HIGH (ref 11.2–14.5)
WBC: 3.8 10*3/uL — ABNORMAL LOW (ref 3.9–10.3)
lymph#: 0.6 10*3/uL — ABNORMAL LOW (ref 0.9–3.3)

## 2016-08-15 LAB — COMPREHENSIVE METABOLIC PANEL
ALBUMIN: 3.8 g/dL (ref 3.5–5.0)
ALK PHOS: 163 U/L — AB (ref 40–150)
ALT: 19 U/L (ref 0–55)
AST: 14 U/L (ref 5–34)
Anion Gap: 12 mEq/L — ABNORMAL HIGH (ref 3–11)
BILIRUBIN TOTAL: 0.48 mg/dL (ref 0.20–1.20)
BUN: 17 mg/dL (ref 7.0–26.0)
CO2: 21 mEq/L — ABNORMAL LOW (ref 22–29)
CREATININE: 0.8 mg/dL (ref 0.6–1.1)
Calcium: 10.2 mg/dL (ref 8.4–10.4)
Chloride: 103 mEq/L (ref 98–109)
EGFR: 76 mL/min/{1.73_m2} — ABNORMAL LOW (ref >90)
GLUCOSE: 229 mg/dL — AB (ref 70–140)
Potassium: 4 mEq/L (ref 3.5–5.1)
SODIUM: 136 meq/L (ref 136–145)
TOTAL PROTEIN: 7.4 g/dL (ref 6.4–8.3)

## 2016-08-15 MED ORDER — PALONOSETRON HCL INJECTION 0.25 MG/5ML
0.2500 mg | Freq: Once | INTRAVENOUS | Status: AC
  Administered 2016-08-15: 0.25 mg via INTRAVENOUS

## 2016-08-15 MED ORDER — PALONOSETRON HCL INJECTION 0.25 MG/5ML
INTRAVENOUS | Status: AC
  Filled 2016-08-15: qty 5

## 2016-08-15 MED ORDER — PACLITAXEL CHEMO INJECTION 300 MG/50ML
175.0000 mg/m2 | Freq: Once | INTRAVENOUS | Status: AC
  Administered 2016-08-15: 366 mg via INTRAVENOUS
  Filled 2016-08-15: qty 61

## 2016-08-15 MED ORDER — SODIUM CHLORIDE 0.9 % IV SOLN
Freq: Once | INTRAVENOUS | Status: AC
  Administered 2016-08-15: 10:00:00 via INTRAVENOUS

## 2016-08-15 MED ORDER — SODIUM CHLORIDE 0.9 % IV SOLN
648.0000 mg | Freq: Once | INTRAVENOUS | Status: AC
  Administered 2016-08-15: 650 mg via INTRAVENOUS
  Filled 2016-08-15: qty 65

## 2016-08-15 MED ORDER — DIPHENHYDRAMINE HCL 50 MG/ML IJ SOLN
INTRAMUSCULAR | Status: AC
  Filled 2016-08-15: qty 1

## 2016-08-15 MED ORDER — FAMOTIDINE IN NACL 20-0.9 MG/50ML-% IV SOLN
20.0000 mg | Freq: Two times a day (BID) | INTRAVENOUS | Status: DC
  Administered 2016-08-15: 20 mg via INTRAVENOUS

## 2016-08-15 MED ORDER — PEGFILGRASTIM 6 MG/0.6ML ~~LOC~~ PSKT
6.0000 mg | PREFILLED_SYRINGE | Freq: Once | SUBCUTANEOUS | Status: AC
  Administered 2016-08-15: 6 mg via SUBCUTANEOUS
  Filled 2016-08-15: qty 0.6

## 2016-08-15 MED ORDER — SODIUM CHLORIDE 0.9 % IV SOLN
Freq: Once | INTRAVENOUS | Status: AC
  Administered 2016-08-15: 11:00:00 via INTRAVENOUS
  Filled 2016-08-15: qty 5

## 2016-08-15 MED ORDER — DIPHENHYDRAMINE HCL 50 MG/ML IJ SOLN
50.0000 mg | Freq: Once | INTRAMUSCULAR | Status: AC
  Administered 2016-08-15: 50 mg via INTRAVENOUS

## 2016-08-15 MED ORDER — FAMOTIDINE IN NACL 20-0.9 MG/50ML-% IV SOLN
INTRAVENOUS | Status: AC
  Filled 2016-08-15: qty 50

## 2016-08-15 NOTE — Assessment & Plan Note (Signed)
Her blood pressure is elevated. Could be due to whitecoat hypertension. I recommend close monitoring only. 

## 2016-08-15 NOTE — Assessment & Plan Note (Signed)
She has recent minor skin irritation. I recommend and then a course of Keflex and monitor closely for cellulitis

## 2016-08-15 NOTE — Progress Notes (Signed)
[place] progress notes  Patient Care Team: [name], MD as PCP - General (Family Medicine)  CHIEF COMPLAINTS/PURPOSE OF VISIT:  Recurrent endometrial cancer, for further chemotherapy  HISTORY OF PRESENTING ILLNESS:  Michelle Rosario 71 y.o. female was transferred to my care after her prior physician has left.  I reviewed the patient's records extensive and collaborated the history with the patient. Summary of her history is as follows:   Recurrent carcinoma of endometrium ([facility])   01/02/2016 Pathology Results    Uterus +/- tubes/ovaries, neoplastic, cervix ENDOMETRIAL ADENOCARCINOMA, FIGO GRADE 2 (4.7 CM) THE TUMOR INVADES LESS THAN ONE-HALF OF THE MYOMETRIUM (PT1A) ALL MARGINS OF RESECTION ARE NEGATIVE FOR CARCINOMA LEIOMYOMAS AND ADENOMYOSIS BILATERAL FALLOPIAN TUBES AND OVARIES: HISTOLOGICAL UNREMARKABLE 2. Lymph node, sentinel, biopsy, right obturator ONE BENIGN LYMPH NODE (0/1) 3. Lymph nodes, regional resection, left pelvic FOUR BENIGN LYMPH NODES (0/4)      01/02/2016 Surgery    Dr. Denman George performed robotic-assisted laparoscopic total hysterectomy with bilateral salpingoophorectomy, sentinel lymph node biopsy, lymphadenectomy        05/15/2016 Pathology Results    Vagina, biopsy, mid - ADENOCARCINOMA, SEE COMMENT. Microscopic Comment The morphology along with the patient's history are consistent with recurrent endometrioid adenocarcinoma. The carcinoma has a similar appearance to the primary [id number]).      05/20/2016 Imaging    Ct scan abdomen showed solid 2.5 cm peritoneal mass in the mid to anterior left pelvis, suspicious for peritoneal metastasis. 2. Small expansile low-attenuation filling defect in the left external iliac vein, cannot exclude a small deep venous thrombus. Consider correlation with left lower extremity venous Doppler scan. 3. Small simple fluid density structure in the left pelvic sidewall abutting the left external  iliac vessels, favor a small postoperative seroma. 4. No ascites. 5. No lymphadenopathy.  No metastatic disease in the chest. 6. Aortic atherosclerosis.      06/12/2016 PET scan    Intensely hypermetabolic 2.1 cm central pelvic peritoneal mass just to the left of midline, consistent with peritoneal metastatic recurrence. No ascites. 2. No additional hypermetabolic sites of metastatic disease. 3. Diffuse thyroid hypermetabolism without discrete thyroid nodule, favoring thyroiditis. Recommend correlation with serum thyroid function tests.      06/24/2016 - 07/22/2016 Chemotherapy    The patient had weekly cisplatin. She has missed several doses due to infection and pancytopenia      06/24/2016 - 08/01/2016 Radiation Therapy    She completed concurrent radiation therapy      Disposition returns here for further management. According to recent documentation by her previous oncologist, she had multiple infectious complication. She just completed a course of antibiotics for urinary tract infection. She had recent dental evaluation. She complained of recent discomfort in her hand from IV sites. Her hand seems to be a little tender. She denies recent fever or chills. Denies recent vaginal bleeding. She denies perform neuropathy. No recent mucositis, nausea or vomiting from treatment  MEDICAL HISTORY:  Past Medical History:  Diagnosis Date  . Broken ribs    hx of  . Family history of adverse reaction to anesthesia    mother post op N&V  . Hypertension   . Hypothyroidism   . Uterine cancer [facility])    May 30 , 2017    SURGICAL HISTORY: Past Surgical History:  Procedure Laterality Date  . achellis tendon repaired in 2005    . ANKLE SURGERY Right Oct 2006  . fisure  2014  . LYMPH NODE BIOPSY N/A  01/02/2016   Procedure: SENTINAL LYMPH NODE BIOPSY;  Surgeon: Everitt Amber, MD;  Location: [facility];  Service: Gynecology;  Laterality: N/A;  . rectal fissure surgery 2010    . REPLACEMENT TOTAL  KNEE Left 2015  . ROBOTIC ASSISTED TOTAL HYSTERECTOMY WITH BILATERAL SALPINGO OOPHERECTOMY Bilateral 01/02/2016   Procedure: XI ROBOTIC ASSISTED TOTAL LAPAROSCOPIC HYSTERECTOMY WITH BILATERAL SALPINGO OOPHORECTOMY;  Surgeon: Everitt Amber, MD;  Location: [facility];  Service: Gynecology;  Laterality: Bilateral;  . TUBAL LIGATION      SOCIAL HISTORY: Social History   Social History  . Marital status: Divorced    Spouse name: N/A  . Number of children: 1  . Years of education: N/A   Occupational History  . retired Sales executive    Social History Main Topics  . Smoking status: Never Smoker  . Smokeless tobacco: Never Used  . Alcohol use Yes     Comment: socially  . Drug use: No  . Sexual activity: No   Other Topics Concern  . Not on file   Social History Narrative  . No narrative on file    FAMILY HISTORY: Family History  Problem Relation Age of Onset  . Anesthesia problems Mother   . Cancer Mother     uterine cancer  . Cancer Father     bladder cancer  . Diabetes Sister     ALLERGIES:  is allergic to latex.  MEDICATIONS:  Current Outpatient Prescriptions  Medication Sig Dispense Refill  . B Complex Vitamins (VITAMIN B COMPLEX) TABS Take 1 tablet by mouth daily.     . Calcium Carbonate-Vitamin D (CALTRATE COLON HEALTH PO) Take 1 tablet by mouth daily.    . chlorhexidine (PERIDEX) 0.12 % solution Use as directed 15 mLs in the mouth or throat 2 (two) times daily. 120 mL 0  . clonazePAM (KLONOPIN) 0.5 MG tablet Take 0.5 mg by mouth daily as needed (restless legs).    Marland Kitchen dexamethasone (DECADRON) 4 MG tablet Take 5 tablets with food 12 hrs and 6 hrs prior to Taxol chemotherapy. 10 tablet 0  . fluticasone (FLONASE) 50 MCG/ACT nasal spray Place 1 spray into both nostrils 2 (two) times daily as needed.  1  . gabapentin (NEURONTIN) 600 MG tablet Take 600 mg by mouth at bedtime.     Marland Kitchen levothyroxine (SYNTHROID, LEVOTHROID) 75 MCG tablet Take 75 mcg by mouth daily before breakfast.      . loperamide (IMODIUM) 2 MG capsule Take by mouth as needed for diarrhea or loose stools.    Marland Kitchen losartan-hydrochlorothiazide (HYZAAR) 100-12.5 MG tablet Take 1 tablet by mouth daily.    . magnesium 30 MG tablet Take 30 mg by mouth daily.     . Melatonin 3 MG TBDP Take 3 mg by mouth at bedtime.     . Multiple Vitamin (MULTIVITAMIN WITH MINERALS) TABS tablet Take 1 tablet by mouth daily.    Marland Kitchen nystatin (MYCOSTATIN/NYSTOP) powder Apply to affected area  twice a day as directed. 15 g 1  . ondansetron (ZOFRAN) 8 MG tablet Take 1 tablet (8 mg total) by mouth every 8 (eight) hours as needed for nausea. 60 tablet 1  . Potassium (POTASSIMIN) 75 MG TABS Take 75 mg by mouth daily.    . prochlorperazine (COMPAZINE) 10 MG tablet Take 1 tablet (10 mg total) by mouth every 6 (six) hours as needed for nausea. 20 tablet 0  . traZODone (DESYREL) 50 MG tablet Take 50 mg by mouth at bedtime.     . ALPRAZolam (  XANAX) 1 MG tablet Take 1/2 to one tablet at bedtime as needed for insomnia and restless legs.    . cephALEXin (KEFLEX) 500 MG capsule Take 1 capsule (500 mg total) by mouth 2 (two) times daily. For 7 days 14 capsule 0  . LORazepam (ATIVAN) 1 MG tablet Place 1 tablet (1 mg total) under the tongue every 8 (eight) hours as needed (nausea). 30 tablet 0   No current facility-administered medications for this visit.     REVIEW OF SYSTEMS:   Constitutional: Denies fevers, chills or abnormal night sweats Eyes: Denies blurriness of vision, double vision or watery eyes Ears, nose, mouth, throat, and face: Denies mucositis or sore throat Respiratory: Denies cough, dyspnea or wheezes Cardiovascular: Denies palpitation, chest discomfort or lower extremity swelling Gastrointestinal:  Denies nausea, heartburn or change in bowel habits Lymphatics: Denies new lymphadenopathy or easy bruising Neurological:Denies numbness, tingling or new weaknesses Behavioral/Psych: Mood is stable, no new changes  All other systems were  reviewed with the patient and are negative.  PHYSICAL EXAMINATION: ECOG PERFORMANCE STATUS: 1 - Symptomatic but completely ambulatory  Vitals:   08/13/16 1251  BP: (!) 147/73  Pulse: 74  Resp: 16  Temp: 97.7 F (36.5 C)   Filed Weights   08/13/16 1251  Weight: 213 lb 14.4 oz (97 kg)    GENERAL:alert, no distress and comfortable. She is moderately obese SKIN: skin color, texture, turgor are normal, no rashes or significant lesions. Mild redness on her hand EYES: normal, conjunctiva are pink and non-injected, sclera clear OROPHARYNX:no exudate, normal lips, buccal mucosa, and tongue  NECK: supple, thyroid normal size, non-tender, without nodularity LYMPH:  no palpable lymphadenopathy in the cervical, axillary or inguinal LUNGS: clear to auscultation and percussion with normal breathing effort HEART: regular rate & rhythm and no murmurs without lower extremity edema ABDOMEN:abdomen soft, non-tender and normal bowel sounds Musculoskeletal:no cyanosis of digits and no clubbing  PSYCH: alert & oriented x 3 with fluent speech NEURO: no focal motor/sensory deficits  LABORATORY DATA:  I have reviewed the data as listed Lab Results  Component Value Date   WBC 3.3 (L) 08/13/2016   HGB 10.4 (L) 08/13/2016   HCT 30.8 (L) 08/13/2016   MCV 83.2 08/13/2016   PLT 186 08/13/2016    Recent Labs  01/01/16 1400 01/03/16 0454  07/22/16 0743 07/29/16 1351 08/13/16 1226  NA 138 135  < > 138 141 140  K 3.6 3.9  < > 3.2* 4.1 3.7  CL 105 103  --   --   --   --   CO2 26 25  < > 24 28 28   GLUCOSE 85 142*  < > 110 102 120  BUN 15 13  < > 11.0 16.8 22.3  CREATININE 0.70 0.69  < > 0.8 0.8 0.7  CALCIUM 9.5 9.2  < > 9.3 9.7 9.2  GFRNONAA >60 >60  --   --   --   --   GFRAA >60 >60  --   --   --   --   PROT 7.7  --   < > 6.6 6.8 6.4  ALBUMIN 4.1  --   < > 3.4* 3.4* 3.4*  AST 19  --   < > 17 21 15   ALT 15  --   < > 17 36 18  ALKPHOS 73  --   < > 125 137 144  BILITOT 0.7  --   < > 0.64 0.38  0.55  < > =  values in this interval not displayed.  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[place]) Per discussion, she will complete 4 more cycles of carboplatin and Taxol. She is aware for the need for premedication prior to treatment Given strong family history of GYN malignancy, I recommend genetic counseling and she agreed to be referred  Essential hypertension Her blood pressure is elevated. Could be due to whitecoat hypertension. I recommend close monitoring only.  Pancytopenia, acquired [facility]) This is related to recent treatment. She is not symptomatic from anemia or leukopenia All her recent infectious complication had resolved.  IV infiltration She has recent minor skin irritation. I recommend and then a course of Keflex and monitor closely for cellulitis   Orders Placed This Encounter  Procedures  . CBC with Differential    Standing Status:   Standing    Number of Occurrences:   20    Standing Expiration Date:   08/14/2017  . Comprehensive metabolic panel    Standing Status:   Standing    Number of Occurrences:   20    Standing Expiration Date:   08/14/2017  . Ambulatory referral to Genetics    Referral Priority:   Routine    Referral Type:   Consultation    Referral Reason:   Specialty Services Required    Number of Visits Requested:   1    All questions were answered. The patient knows to call the clinic with any problems, questions or concerns. I spent 40 minutes counseling the patient face to face. The total time spent in the appointment was 60 minutes and more than 50% was on counseling.     Heath Lark, MD 08/15/2016 9:11 AM

## 2016-08-15 NOTE — Patient Instructions (Signed)
[facility] Discharge Instructions for Patients Receiving Chemotherapy  Today you received the following chemotherapy agents carboplatin and paclitaxel (Taxol).  To help prevent nausea and vomiting after your treatment, we encourage you to take your nausea medication as directed by your MD.   If you develop nausea and vomiting that is not controlled by your nausea medication, call the clinic.   BELOW ARE SYMPTOMS THAT SHOULD BE REPORTED IMMEDIATELY:  *FEVER GREATER THAN 100.5 F  *CHILLS WITH OR WITHOUT FEVER  NAUSEA AND VOMITING THAT IS NOT CONTROLLED WITH YOUR NAUSEA MEDICATION  *UNUSUAL SHORTNESS OF BREATH  *UNUSUAL BRUISING OR BLEEDING  TENDERNESS IN MOUTH AND THROAT WITH OR WITHOUT PRESENCE OF ULCERS  *URINARY PROBLEMS  *BOWEL PROBLEMS  UNUSUAL RASH Items with * indicate a potential emergency and should be followed up as soon as possible.  Feel free to call the clinic you have any questions or concerns. The clinic phone number is [phone].  Please show the [place] at check-in to the Emergency Department and triage nurse.  Carboplatin injection What is this medicine? CARBOPLATIN (KAR boe pla tin) is a chemotherapy drug. It targets fast dividing cells, like cancer cells, and causes these cells to die. This medicine is used to treat ovarian cancer and many other cancers. This medicine may be used for other purposes; ask your health care provider or pharmacist if you have questions. COMMON BRAND NAME(S): Paraplatin What should I tell my health care provider before I take this medicine? They need to know if you have any of these conditions: -blood disorders -hearing problems -kidney disease -recent or ongoing radiation therapy -an unusual or allergic reaction to carboplatin, cisplatin, other chemotherapy, other medicines, foods, dyes, or preservatives -pregnant or trying to get pregnant -breast-feeding How should I use this medicine? This  drug is usually given as an infusion into a vein. It is administered in a hospital or clinic by a specially trained health care professional. Talk to your pediatrician regarding the use of this medicine in children. Special care may be needed. Overdosage: If you think you have taken too much of this medicine contact a poison control center or emergency room at once. NOTE: This medicine is only for you. Do not share this medicine with others. What if I miss a dose? It is important not to miss a dose. Call your doctor or health care professional if you are unable to keep an appointment. What may interact with this medicine? -medicines for seizures -medicines to increase blood counts like filgrastim, pegfilgrastim, sargramostim -some antibiotics like amikacin, gentamicin, neomycin, streptomycin, tobramycin -vaccines Talk to your doctor or health care professional before taking any of these medicines: -acetaminophen -aspirin -ibuprofen -ketoprofen -naproxen This list may not describe all possible interactions. Give your health care provider a list of all the medicines, herbs, non-prescription drugs, or dietary supplements you use. Also tell them if you smoke, drink alcohol, or use illegal drugs. Some items may interact with your medicine. What should I watch for while using this medicine? Your condition will be monitored carefully while you are receiving this medicine. You will need important blood work done while you are taking this medicine. This drug may make you feel generally unwell. This is not uncommon, as chemotherapy can affect healthy cells as well as cancer cells. Report any side effects. Continue your course of treatment even though you feel ill unless your doctor tells you to stop. In some cases, you may be given additional medicines to help  with side effects. Follow all directions for their use. Call your doctor or health care professional for advice if you get a fever, chills or sore  throat, or other symptoms of a cold or flu. Do not treat yourself. This drug decreases your body's ability to fight infections. Try to avoid being around people who are sick. This medicine may increase your risk to bruise or bleed. Call your doctor or health care professional if you notice any unusual bleeding. Be careful brushing and flossing your teeth or using a toothpick because you may get an infection or bleed more easily. If you have any dental work done, tell your dentist you are receiving this medicine. Avoid taking products that contain aspirin, acetaminophen, ibuprofen, naproxen, or ketoprofen unless instructed by your doctor. These medicines may hide a fever. Do not become pregnant while taking this medicine. Women should inform their doctor if they wish to become pregnant or think they might be pregnant. There is a potential for serious side effects to an unborn child. Talk to your health care professional or pharmacist for more information. Do not breast-feed an infant while taking this medicine. What side effects may I notice from receiving this medicine? Side effects that you should report to your doctor or health care professional as soon as possible: -allergic reactions like skin rash, itching or hives, swelling of the face, lips, or tongue -signs of infection - fever or chills, cough, sore throat, pain or difficulty passing urine -signs of decreased platelets or bleeding - bruising, pinpoint red spots on the skin, black, tarry stools, nosebleeds -signs of decreased red blood cells - unusually weak or tired, fainting spells, lightheadedness -breathing problems -changes in hearing -changes in vision -chest pain -high blood pressure -low blood counts - This drug may decrease the number of white blood cells, red blood cells and platelets. You may be at increased risk for infections and bleeding. -nausea and vomiting -pain, swelling, redness or irritation at the injection site -pain,  tingling, numbness in the hands or feet -problems with balance, talking, walking -trouble passing urine or change in the amount of urine Side effects that usually do not require medical attention (report to your doctor or health care professional if they continue or are bothersome): -hair loss -loss of appetite -metallic taste in the mouth or changes in taste This list may not describe all possible side effects. Call your doctor for medical advice about side effects. You may report side effects to FDA at 1-800-FDA-1088. Where should I keep my medicine? This drug is given in a hospital or clinic and will not be stored at home. NOTE: This sheet is a summary. It may not cover all possible information. If you have questions about this medicine, talk to your doctor, pharmacist, or health care provider.  2017 Elsevier/Gold Standard (2007-09-29 14:38:05)  Paclitaxel injection What is this medicine? PACLITAXEL (PAK li TAX el) is a chemotherapy drug. It targets fast dividing cells, like cancer cells, and causes these cells to die. This medicine is used to treat ovarian cancer, breast cancer, and other cancers. This medicine may be used for other purposes; ask your health care provider or pharmacist if you have questions. COMMON BRAND NAME(S): Onxol, Taxol What should I tell my health care provider before I take this medicine? They need to know if you have any of these conditions: -blood disorders -irregular heartbeat -infection (especially a virus infection such as chickenpox, cold sores, or herpes) -liver disease -previous or ongoing radiation therapy -an unusual  or allergic reaction to paclitaxel, alcohol, polyoxyethylated castor oil, other chemotherapy agents, other medicines, foods, dyes, or preservatives -pregnant or trying to get pregnant -breast-feeding How should I use this medicine? This drug is given as an infusion into a vein. It is administered in a hospital or clinic by a specially  trained health care professional. Talk to your pediatrician regarding the use of this medicine in children. Special care may be needed. Overdosage: If you think you have taken too much of this medicine contact a poison control center or emergency room at once. NOTE: This medicine is only for you. Do not share this medicine with others. What if I miss a dose? It is important not to miss your dose. Call your doctor or health care professional if you are unable to keep an appointment. What may interact with this medicine? Do not take this medicine with any of the following medications: -disulfiram -metronidazole This medicine may also interact with the following medications: -cyclosporine -diazepam -ketoconazole -medicines to increase blood counts like filgrastim, pegfilgrastim, sargramostim -other chemotherapy drugs like cisplatin, doxorubicin, epirubicin, etoposide, teniposide, vincristine -quinidine -testosterone -vaccines -verapamil Talk to your doctor or health care professional before taking any of these medicines: -acetaminophen -aspirin -ibuprofen -ketoprofen -naproxen This list may not describe all possible interactions. Give your health care provider a list of all the medicines, herbs, non-prescription drugs, or dietary supplements you use. Also tell them if you smoke, drink alcohol, or use illegal drugs. Some items may interact with your medicine. What should I watch for while using this medicine? Your condition will be monitored carefully while you are receiving this medicine. You will need important blood work done while you are taking this medicine. This medicine can cause serious allergic reactions. To reduce your risk you will need to take other medicine(s) before treatment with this medicine. If you experience allergic reactions like skin rash, itching or hives, swelling of the face, lips, or tongue, tell your doctor or health care professional right away. In some cases,  you may be given additional medicines to help with side effects. Follow all directions for their use. This drug may make you feel generally unwell. This is not uncommon, as chemotherapy can affect healthy cells as well as cancer cells. Report any side effects. Continue your course of treatment even though you feel ill unless your doctor tells you to stop. Call your doctor or health care professional for advice if you get a fever, chills or sore throat, or other symptoms of a cold or flu. Do not treat yourself. This drug decreases your body's ability to fight infections. Try to avoid being around people who are sick. This medicine may increase your risk to bruise or bleed. Call your doctor or health care professional if you notice any unusual bleeding. Be careful brushing and flossing your teeth or using a toothpick because you may get an infection or bleed more easily. If you have any dental work done, tell your dentist you are receiving this medicine. Avoid taking products that contain aspirin, acetaminophen, ibuprofen, naproxen, or ketoprofen unless instructed by your doctor. These medicines may hide a fever. Do not become pregnant while taking this medicine. Women should inform their doctor if they wish to become pregnant or think they might be pregnant. There is a potential for serious side effects to an unborn child. Talk to your health care professional or pharmacist for more information. Do not breast-feed an infant while taking this medicine. Men are advised not to father a  child while receiving this medicine. This product may contain alcohol. Ask your pharmacist or healthcare provider if this medicine contains alcohol. Be sure to tell all healthcare providers you are taking this medicine. Certain medicines, like metronidazole and disulfiram, can cause an unpleasant reaction when taken with alcohol. The reaction includes flushing, headache, nausea, vomiting, sweating, and increased thirst. The  reaction can last from 30 minutes to several hours. What side effects may I notice from receiving this medicine? Side effects that you should report to your doctor or health care professional as soon as possible: -allergic reactions like skin rash, itching or hives, swelling of the face, lips, or tongue -low blood counts - This drug may decrease the number of white blood cells, red blood cells and platelets. You may be at increased risk for infections and bleeding. -signs of infection - fever or chills, cough, sore throat, pain or difficulty passing urine -signs of decreased platelets or bleeding - bruising, pinpoint red spots on the skin, black, tarry stools, nosebleeds -signs of decreased red blood cells - unusually weak or tired, fainting spells, lightheadedness -breathing problems -chest pain -high or low blood pressure -mouth sores -nausea and vomiting -pain, swelling, redness or irritation at the injection site -pain, tingling, numbness in the hands or feet -slow or irregular heartbeat -swelling of the ankle, feet, hands Side effects that usually do not require medical attention (report to your doctor or health care professional if they continue or are bothersome): -bone pain -complete hair loss including hair on your head, underarms, pubic hair, eyebrows, and eyelashes -changes in the color of fingernails -diarrhea -loosening of the fingernails -loss of appetite -muscle or joint pain -red flush to skin -sweating This list may not describe all possible side effects. Call your doctor for medical advice about side effects. You may report side effects to FDA at 1-800-FDA-1088. Where should I keep my medicine? This drug is given in a hospital or clinic and will not be stored at home. NOTE: This sheet is a summary. It may not cover all possible information. If you have questions about this medicine, talk to your doctor, pharmacist, or health care provider.  2017 Elsevier/Gold  Standard (2015-04-25 19:58:00)

## 2016-08-15 NOTE — Assessment & Plan Note (Addendum)
Per discussion, she will complete 4 more cycles of carboplatin and Taxol. She is aware for the need for premedication prior to treatment Given strong family history of GYN malignancy, I recommend genetic counseling and she agreed to be referred

## 2016-08-15 NOTE — Assessment & Plan Note (Signed)
This is related to recent treatment. She is not symptomatic from anemia or leukopenia All her recent infectious complication had resolved.

## 2016-08-19 ENCOUNTER — Telehealth: Payer: Self-pay | Admitting: *Deleted

## 2016-08-19 NOTE — Telephone Encounter (Signed)
Called patient to remind of [place]. For 08-20-16 no answer

## 2016-08-20 ENCOUNTER — Ambulatory Visit
Admission: RE | Admit: 2016-08-20 | Discharge: 2016-08-20 | Disposition: A | Discharge status: Home / Self Care | Payer: Medicare Other | Source: Ambulatory Visit | Attending: Radiation Oncology | Admitting: Radiation Oncology

## 2016-08-20 ENCOUNTER — Other Ambulatory Visit: Payer: Self-pay

## 2016-08-20 DIAGNOSIS — C541 Malignant neoplasm of endometrium: Secondary | ICD-10-CM

## 2016-08-20 DIAGNOSIS — Z510 Encounter for antineoplastic radiation therapy: Secondary | ICD-10-CM | POA: Diagnosis not present

## 2016-08-20 DIAGNOSIS — R300 Dysuria: Secondary | ICD-10-CM

## 2016-08-20 MED ORDER — DEXAMETHASONE 4 MG PO TABS: ORAL_TABLET | ORAL | 6 refills | Status: DC

## 2016-08-20 NOTE — Progress Notes (Signed)
  Radiation Oncology         [phone] ________________________________  Name: Michelle Rosario MRN: [id number]  Date: 08/20/2016  DOB: [birth year]  CC: Woody Seller, MD  [name], MD  HDR BRACHYTHERAPY NOTE  DIAGNOSIS: FIGO stage IA (pT1a, pN0) grade 2 endometrial adenocarcinoma, now with vaginal and pelvic recurrence   Simple treatment device note: Patient had construction of her custom vaginal cylinder. She will be treated with a 3 cm diameter segmented cylinder. This conforms to her anatomy without undue discomfort.  Vaginal brachytherapy procedure node: The patient was brought to the [place] suite. Identity was confirmed. All relevant records and images related to the planned course of therapy were reviewed. The patient freely provided informed written consent to proceed with treatment after reviewing the details related to the planned course of therapy. The consent form was witnessed and verified by the simulation staff. Then, the patient was set-up in a stable reproducible supine position for radiation therapy. Pelvic exam revealed the vaginal cuff to be intact The patient's custom vaginal cylinder was placed in the proximal vagina. This was affixed to the CT/MR stabilization plate to prevent slippage. Patient tolerated the placement well.  Verification simulation note:  A fiducial marker was placed within the vaginal cylinder. An AP and lateral film was then obtained through the pelvis area. This documented accurate position of the vaginal cylinder for treatment.  HDR BRACHYTHERAPY TREATMENT  The remote afterloading device was affixed to the vaginal cylinder by catheter. Patient then proceeded to undergo her second high-dose-rate treatment directed at the proximal vagina. The patient was prescribed a dose of 6 gray to be delivered to the mucosal surface. Treatment length was 4 cm. Patient was treated with 1 channel using 9 dwell positions. Treatment time was 275.4 seconds. Iridium  192 was the high-dose-rate source for treatment. The patient tolerated the treatment well. After completion of her therapy, a radiation survey was performed documenting return of the iridium source into the GammaMed safe.   PLAN: The patient will return for her third HDR treatment on 08/27/16. ________________________________  Blair Promise, PhD, MD   This document serves as a record of services personally performed by Gery Pray, MD. It was created on his behalf by Darcus Austin, a trained medical scribe. The creation of this record is based on the scribe's personal observations and the provider's statements to them. This document has been checked and approved by the attending provider.

## 2016-08-22 ENCOUNTER — Telehealth: Payer: Self-pay | Admitting: *Deleted

## 2016-08-22 ENCOUNTER — Other Ambulatory Visit: Payer: Self-pay | Admitting: *Deleted

## 2016-08-22 NOTE — Telephone Encounter (Signed)
Patient received first dose taxol/carbo on 2/8. 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 Patient is currently on gabapentin. Any suggestions on some ways to help with the numbness/tingling.

## 2016-08-22 NOTE — Telephone Encounter (Signed)
Patient informed and verbalized understanding

## 2016-08-22 NOTE — Telephone Encounter (Signed)
Darden Dates,  Please tell her to keep her hands and feet warm for now I will discuss possible dose change for gabapentin when I see her next time

## 2016-08-23 ENCOUNTER — Telehealth: Payer: Self-pay

## 2016-08-23 MED ORDER — TRAMADOL HCL 50 MG PO TABS: 50.0000 mg | ORAL_TABLET | Freq: Three times a day (TID) | ORAL | 0 refills | Status: DC | PRN

## 2016-08-23 MED ORDER — GABAPENTIN 600 MG PO TABS: 600.0000 mg | ORAL_TABLET | Freq: Two times a day (BID) | ORAL | 0 refills | Status: DC

## 2016-08-23 NOTE — Telephone Encounter (Signed)
error 

## 2016-08-23 NOTE — Telephone Encounter (Signed)
S/w pt per Dr Alvy Bimler attached message. Rx sent

## 2016-08-23 NOTE — Telephone Encounter (Signed)
I have already replied to another staff message Call in gabapentin 600 mg BID until I see her next. Keep her feet warm. Check her blood sugar If not allergic, call in tramadol 50 mg TID/prn PO for pain, 30 tabs, no refills

## 2016-08-23 NOTE — Telephone Encounter (Signed)
Pt is on taxol and carboplatin cycle 1. Her feet are keeping her awake at night. Nerve endings feel like they are on the top of her skin. Feet are burning/numb/hurting. Pt is taking gabapentin 600mg  nightly prior to chemo d/t restless leg.

## 2016-08-26 ENCOUNTER — Telehealth: Payer: Self-pay | Admitting: *Deleted

## 2016-08-26 NOTE — Telephone Encounter (Signed)
CALLED PATIENT TO REMIND OF HDR TX. ON 08-27-16 @ 11AM, SPOKE WITH PATIENT AND SHE IS AWARE OF THIS [place].

## 2016-08-27 ENCOUNTER — Ambulatory Visit (HOSPITAL_BASED_OUTPATIENT_CLINIC_OR_DEPARTMENT_OTHER): Payer: Medicare Other | Admitting: Hematology and Oncology

## 2016-08-27 ENCOUNTER — Telehealth: Payer: Self-pay | Admitting: *Deleted

## 2016-08-27 ENCOUNTER — Ambulatory Visit
Admission: RE | Admit: 2016-08-27 | Discharge: 2016-08-27 | Disposition: A | Discharge status: Home / Self Care | Payer: Medicare Other | Source: Ambulatory Visit | Attending: Radiation Oncology | Admitting: Radiation Oncology

## 2016-08-27 ENCOUNTER — Other Ambulatory Visit: Payer: Self-pay | Admitting: Hematology and Oncology

## 2016-08-27 DIAGNOSIS — C541 Malignant neoplasm of endometrium: Secondary | ICD-10-CM

## 2016-08-27 DIAGNOSIS — L03119 Cellulitis of unspecified part of limb: Secondary | ICD-10-CM

## 2016-08-27 MED ORDER — SULFAMETHOXAZOLE-TRIMETHOPRIM 800-160 MG PO TABS: 1.0000 | ORAL_TABLET | Freq: Two times a day (BID) | ORAL | 0 refills | Status: DC

## 2016-08-27 NOTE — Progress Notes (Signed)
  Radiation Oncology         [phone] ________________________________  Name: Michelle Rosario MRN: [id number]  Date: 08/27/2016  DOB: [birth year]  CC: Woody Seller, MD  [name], MD  HDR BRACHYTHERAPY NOTE  DIAGNOSIS: FIGO stage IA (pT1a, pN0) grade 2 endometrial adenocarcinoma, now with vaginal and pelvic recurrence   Simple treatment device note: Patient had construction of her custom vaginal cylinder. She will be treated with a 3 cm diameter segmented cylinder. This conforms to her anatomy without undue discomfort.  Vaginal brachytherapy procedure node: The patient was brought to the [place] suite. Identity was confirmed. All relevant records and images related to the planned course of therapy were reviewed. The patient freely provided informed written consent to proceed with treatment after reviewing the details related to the planned course of therapy. The consent form was witnessed and verified by the simulation staff. Then, the patient was set-up in a stable reproducible supine position for radiation therapy. Pelvic exam revealed the vaginal cuff to be intact The patient's custom vaginal cylinder was placed in the proximal vagina. This was affixed to the CT/MR stabilization plate to prevent slippage. Patient tolerated the placement well.  Verification simulation note:  A fiducial marker was placed within the vaginal cylinder. An AP and lateral film was then obtained through the pelvis area. This documented accurate position of the vaginal cylinder for treatment.  HDR BRACHYTHERAPY TREATMENT  The remote afterloading device was affixed to the vaginal cylinder by catheter. Patient then proceeded to undergo her third high-dose-rate treatment directed at the proximal vagina. The patient was prescribed a dose of 6 gray to be delivered to the mucosal surface. Treatment length was 4 cm. Patient was treated with 1 channel using 9 dwell positions. Treatment time was 294 seconds. Iridium  192 was the high-dose-rate source for treatment. The patient tolerated the treatment well. After completion of her therapy, a radiation survey was performed documenting return of the iridium source into the Gamma[employer] safe.   PLAN: The patient will return for her fourth and final HDR treatment on 09/04/16. ________________________________  Blair Promise, PhD, MD   This document serves as a record of services personally performed by Gery Pray, MD. It was created on his behalf by Darcus Austin, a trained medical scribe. The creation of this record is based on the scribe's personal observations and the provider's statements to them. This document has been checked and approved by the attending provider.

## 2016-08-27 NOTE — Telephone Encounter (Signed)
Voicemail received from patient requesting to talk with a nurse.  "I have bad pain in my arm.  It's hot, swelled, a knot in arm and I can't move my arm."    Returned call.  "Pain started Sunday in the middle of the night to right upper arm between elbow and shoulder.  The pain burned but I have no nail scratches or blood.  It's red with fever with a knot in it.  Getting worse.  I can't move my right arm to put a shirt on due to the pain.  I'm taking tylenol even through the night and using heat.  I have only washed dishes and done laundry.  No lifting, pulling or pushing.  I have radiation at 11:00 today."  Return number [phone].

## 2016-08-27 NOTE — Telephone Encounter (Signed)
Can she come in now so I see her at 1030? If not, I will see her at 1230  OK to add on

## 2016-08-28 ENCOUNTER — Encounter: Payer: Self-pay | Admitting: Radiation Oncology

## 2016-08-29 ENCOUNTER — Telehealth: Payer: Self-pay | Admitting: *Deleted

## 2016-08-29 ENCOUNTER — Encounter (HOSPITAL_COMMUNITY): Payer: Self-pay | Admitting: Emergency Medicine

## 2016-08-29 ENCOUNTER — Inpatient Hospital Stay (HOSPITAL_COMMUNITY)
Admission: EM | Admit: 2016-08-29 | Discharge: 2016-08-31 | DRG: 603 | Disposition: A | Discharge status: Home / Self Care | Payer: Medicare Other | Attending: Internal Medicine | Admitting: Internal Medicine

## 2016-08-29 ENCOUNTER — Encounter: Payer: Self-pay | Admitting: Hematology and Oncology

## 2016-08-29 DIAGNOSIS — L03119 Cellulitis of unspecified part of limb: Secondary | ICD-10-CM | POA: Diagnosis present

## 2016-08-29 DIAGNOSIS — F419 Anxiety disorder, unspecified: Secondary | ICD-10-CM | POA: Diagnosis present

## 2016-08-29 DIAGNOSIS — L0390 Cellulitis, unspecified: Secondary | ICD-10-CM

## 2016-08-29 DIAGNOSIS — Z7952 Long term (current) use of systemic steroids: Secondary | ICD-10-CM

## 2016-08-29 DIAGNOSIS — D638 Anemia in other chronic diseases classified elsewhere: Secondary | ICD-10-CM | POA: Diagnosis present

## 2016-08-29 DIAGNOSIS — Z8049 Family history of malignant neoplasm of other genital organs: Secondary | ICD-10-CM

## 2016-08-29 DIAGNOSIS — M7989 Other specified soft tissue disorders: Secondary | ICD-10-CM | POA: Diagnosis not present

## 2016-08-29 DIAGNOSIS — I10 Essential (primary) hypertension: Secondary | ICD-10-CM | POA: Diagnosis not present

## 2016-08-29 DIAGNOSIS — L03113 Cellulitis of right upper limb: Secondary | ICD-10-CM | POA: Diagnosis not present

## 2016-08-29 DIAGNOSIS — E039 Hypothyroidism, unspecified: Secondary | ICD-10-CM | POA: Diagnosis present

## 2016-08-29 DIAGNOSIS — Z79899 Other long term (current) drug therapy: Secondary | ICD-10-CM

## 2016-08-29 DIAGNOSIS — L03115 Cellulitis of right lower limb: Secondary | ICD-10-CM

## 2016-08-29 DIAGNOSIS — G4700 Insomnia, unspecified: Secondary | ICD-10-CM | POA: Diagnosis present

## 2016-08-29 DIAGNOSIS — Z91040 Latex allergy status: Secondary | ICD-10-CM

## 2016-08-29 DIAGNOSIS — C541 Malignant neoplasm of endometrium: Secondary | ICD-10-CM | POA: Diagnosis present

## 2016-08-29 DIAGNOSIS — Z8052 Family history of malignant neoplasm of bladder: Secondary | ICD-10-CM

## 2016-08-29 DIAGNOSIS — W57XXXA Bitten or stung by nonvenomous insect and other nonvenomous arthropods, initial encounter: Secondary | ICD-10-CM | POA: Diagnosis present

## 2016-08-29 DIAGNOSIS — I808 Phlebitis and thrombophlebitis of other sites: Secondary | ICD-10-CM | POA: Diagnosis present

## 2016-08-29 DIAGNOSIS — I809 Phlebitis and thrombophlebitis of unspecified site: Secondary | ICD-10-CM

## 2016-08-29 DIAGNOSIS — Z923 Personal history of irradiation: Secondary | ICD-10-CM

## 2016-08-29 DIAGNOSIS — Z833 Family history of diabetes mellitus: Secondary | ICD-10-CM

## 2016-08-29 LAB — CBC WITH DIFFERENTIAL/PLATELET
BASOS PCT: 1 %
Basophils Absolute: 0 10*3/uL (ref 0.0–0.1)
Eosinophils Absolute: 0.1 10*3/uL (ref 0.0–0.7)
Eosinophils Relative: 2 %
HEMATOCRIT: 26.2 % — AB (ref 36.0–46.0)
HEMOGLOBIN: 8.8 g/dL — AB (ref 12.0–15.0)
LYMPHS ABS: 1 10*3/uL (ref 0.7–4.0)
Lymphocytes Relative: 16 %
MCH: 28.2 pg (ref 26.0–34.0)
MCHC: 33.6 g/dL (ref 30.0–36.0)
MCV: 84 fL (ref 78.0–100.0)
MONOS PCT: 9 %
Monocytes Absolute: 0.6 10*3/uL (ref 0.1–1.0)
NEUTROS ABS: 4.3 10*3/uL (ref 1.7–7.7)
NEUTROS PCT: 72 %
Platelets: 154 10*3/uL (ref 150–400)
RBC: 3.12 MIL/uL — ABNORMAL LOW (ref 3.87–5.11)
RDW: 16.4 % — ABNORMAL HIGH (ref 11.5–15.5)
WBC: 6 10*3/uL (ref 4.0–10.5)

## 2016-08-29 LAB — COMPREHENSIVE METABOLIC PANEL
ALT: 26 U/L (ref 14–54)
ANION GAP: 7 (ref 5–15)
AST: 23 U/L (ref 15–41)
Albumin: 3.3 g/dL — ABNORMAL LOW (ref 3.5–5.0)
Alkaline Phosphatase: 154 U/L — ABNORMAL HIGH (ref 38–126)
BUN: 15 mg/dL (ref 6–20)
CHLORIDE: 104 mmol/L (ref 101–111)
CO2: 25 mmol/L (ref 22–32)
Calcium: 9.2 mg/dL (ref 8.9–10.3)
Creatinine, Ser: 0.75 mg/dL (ref 0.44–1.00)
GFR calc non Af Amer: >60 mL/min (ref >60)
Glucose, Bld: 95 mg/dL (ref 65–99)
Potassium: 3.6 mmol/L (ref 3.5–5.1)
SODIUM: 136 mmol/L (ref 135–145)
Total Bilirubin: 0.3 mg/dL (ref 0.3–1.2)
Total Protein: 6.5 g/dL (ref 6.5–8.1)

## 2016-08-29 LAB — I-STAT CG4 LACTIC ACID, ED: Lactic Acid, Venous: 0.78 mmol/L (ref 0.5–1.9)

## 2016-08-29 MED ORDER — VANCOMYCIN HCL IN DEXTROSE 1-5 GM/200ML-% IV SOLN: 1000.0000 mg | Freq: Once | INTRAVENOUS | Status: DC

## 2016-08-29 MED ORDER — CLONAZEPAM 0.5 MG PO TABS
0.5000 mg | ORAL_TABLET | Freq: Every day | ORAL | Status: DC | PRN
  Administered 2016-08-30: 0.5 mg via ORAL
  Filled 2016-08-29: qty 1

## 2016-08-29 MED ORDER — PROCHLORPERAZINE MALEATE 10 MG PO TABS: 10.0000 mg | ORAL_TABLET | Freq: Four times a day (QID) | ORAL | Status: DC | PRN

## 2016-08-29 MED ORDER — OXYCODONE HCL 5 MG PO TABS
5.0000 mg | ORAL_TABLET | ORAL | Status: DC | PRN
  Administered 2016-08-29 – 2016-08-31 (×6): 5 mg via ORAL
  Filled 2016-08-29 (×6): qty 1

## 2016-08-29 MED ORDER — CHLORHEXIDINE GLUCONATE 0.12 % MT SOLN
15.0000 mL | Freq: Two times a day (BID) | OROMUCOSAL | Status: DC
  Administered 2016-08-29 – 2016-08-31 (×4): 15 mL via OROMUCOSAL
  Filled 2016-08-29 (×4): qty 15

## 2016-08-29 MED ORDER — B COMPLEX-C PO TABS
1.0000 | ORAL_TABLET | Freq: Every day | ORAL | Status: DC
  Administered 2016-08-30 – 2016-08-31 (×2): 1 via ORAL
  Filled 2016-08-29 (×3): qty 1

## 2016-08-29 MED ORDER — ONDANSETRON HCL 4 MG/2ML IJ SOLN: 4.0000 mg | Freq: Four times a day (QID) | INTRAMUSCULAR | Status: DC | PRN

## 2016-08-29 MED ORDER — CLINDAMYCIN PHOSPHATE 600 MG/50ML IV SOLN
600.0000 mg | Freq: Once | INTRAVENOUS | Status: AC
  Administered 2016-08-29: 600 mg via INTRAVENOUS
  Filled 2016-08-29: qty 50

## 2016-08-29 MED ORDER — LOSARTAN POTASSIUM 50 MG PO TABS
100.0000 mg | ORAL_TABLET | Freq: Every day | ORAL | Status: DC
  Administered 2016-08-30 – 2016-08-31 (×2): 100 mg via ORAL
  Filled 2016-08-29 (×2): qty 2

## 2016-08-29 MED ORDER — FLUTICASONE PROPIONATE 50 MCG/ACT NA SUSP
1.0000 | Freq: Two times a day (BID) | NASAL | Status: DC | PRN
  Filled 2016-08-29: qty 16

## 2016-08-29 MED ORDER — ONDANSETRON HCL 4 MG PO TABS: 4.0000 mg | ORAL_TABLET | Freq: Four times a day (QID) | ORAL | Status: DC | PRN

## 2016-08-29 MED ORDER — MELATONIN 3 MG PO TBDP: 3.0000 mg | ORAL_TABLET | Freq: Every day | ORAL | Status: DC

## 2016-08-29 MED ORDER — GABAPENTIN 300 MG PO CAPS
600.0000 mg | ORAL_CAPSULE | Freq: Two times a day (BID) | ORAL | Status: DC
  Administered 2016-08-29 – 2016-08-31 (×4): 600 mg via ORAL
  Filled 2016-08-29 (×4): qty 2

## 2016-08-29 MED ORDER — PIPERACILLIN-TAZOBACTAM 3.375 G IVPB
3.3750 g | Freq: Three times a day (TID) | INTRAVENOUS | Status: DC
  Administered 2016-08-30 – 2016-08-31 (×4): 3.375 g via INTRAVENOUS
  Filled 2016-08-29 (×4): qty 50

## 2016-08-29 MED ORDER — ACETAMINOPHEN 325 MG PO TABS
650.0000 mg | ORAL_TABLET | Freq: Four times a day (QID) | ORAL | Status: DC | PRN
  Administered 2016-08-29 – 2016-08-31 (×5): 650 mg via ORAL
  Filled 2016-08-29 (×4): qty 2

## 2016-08-29 MED ORDER — POTASSIUM CHLORIDE CRYS ER 10 MEQ PO TBCR
5.0000 meq | EXTENDED_RELEASE_TABLET | Freq: Every day | ORAL | Status: DC
  Administered 2016-08-30: 5 meq via ORAL
  Filled 2016-08-29 (×2): qty 1

## 2016-08-29 MED ORDER — PIPERACILLIN-TAZOBACTAM 3.375 G IVPB 30 MIN
3.3750 g | Freq: Once | INTRAVENOUS | Status: AC
  Administered 2016-08-29: 3.375 g via INTRAVENOUS
  Filled 2016-08-29: qty 50

## 2016-08-29 MED ORDER — ADULT MULTIVITAMIN W/MINERALS CH
1.0000 | ORAL_TABLET | Freq: Every day | ORAL | Status: DC
  Administered 2016-08-30 – 2016-08-31 (×2): 1 via ORAL
  Filled 2016-08-29 (×3): qty 1

## 2016-08-29 MED ORDER — OXYCODONE-ACETAMINOPHEN 5-325 MG PO TABS
1.0000 | ORAL_TABLET | Freq: Once | ORAL | Status: AC
  Administered 2016-08-29: 1 via ORAL
  Filled 2016-08-29: qty 1

## 2016-08-29 MED ORDER — LEVOTHYROXINE SODIUM 88 MCG PO TABS
88.0000 ug | ORAL_TABLET | Freq: Every day | ORAL | Status: DC
  Administered 2016-08-30 – 2016-08-31 (×2): 88 ug via ORAL
  Filled 2016-08-29 (×2): qty 1

## 2016-08-29 MED ORDER — TRAZODONE HCL 50 MG PO TABS
50.0000 mg | ORAL_TABLET | Freq: Every day | ORAL | Status: DC
Start: 2016-08-29 — End: 2016-08-31
  Administered 2016-08-29 – 2016-08-30 (×2): 50 mg via ORAL
  Filled 2016-08-29 (×2): qty 1

## 2016-08-29 MED ORDER — VANCOMYCIN HCL 10 G IV SOLR
1250.0000 mg | Freq: Two times a day (BID) | INTRAVENOUS | Status: DC
  Administered 2016-08-29: 1250 mg via INTRAVENOUS
  Filled 2016-08-29 (×2): qty 1250

## 2016-08-29 MED ORDER — MAGNESIUM OXIDE 400 (241.3 MG) MG PO TABS
200.0000 mg | ORAL_TABLET | Freq: Every day | ORAL | Status: DC
  Administered 2016-08-30 – 2016-08-31 (×2): 200 mg via ORAL
  Filled 2016-08-29 (×3): qty 1

## 2016-08-29 MED ORDER — SODIUM CHLORIDE 0.9 % IV SOLN
INTRAVENOUS | Status: DC
  Administered 2016-08-29: 23:00:00 via INTRAVENOUS

## 2016-08-29 MED ORDER — ALPRAZOLAM 0.5 MG PO TABS: 0.5000 mg | ORAL_TABLET | Freq: Every evening | ORAL | Status: DC | PRN

## 2016-08-29 MED ORDER — ACETAMINOPHEN 650 MG RE SUPP: 650.0000 mg | Freq: Four times a day (QID) | RECTAL | Status: DC | PRN

## 2016-08-29 NOTE — Progress Notes (Signed)
Pharmacy Antibiotic Note  Michelle Rosario is a 71 y.o. female c/o big bite started on Septra on 2/20 admitted on 08/29/2016 with cellulitis.  Pharmacy has been consulted for zosyn/vancomycin dosing.  Plan: Zosyn 3.375g IV q8h (4 hour infusion).  Vancomycin 1250 mg IV q12h VT=15-20 mg/L Daily SCr/f/u cultures/levels as needed  Height: 5\' 6"  (167.6 cm) Weight: 209 lb 9 oz (95.1 kg) IBW/kg (Calculated) : 59.3  Temp (24hrs), Avg:98.8 F (37.1 C), Min:98.8 F (37.1 C), Max:98.8 F (37.1 C)   Recent Labs Lab 08/29/16 1810 08/29/16 1814  WBC 6.0  --   CREATININE 0.75  --   LATICACIDVEN  --  0.78    Estimated Creatinine Clearance: 77.1 mL/min (by C-G formula based on SCr of 0.75 mg/dL).    Allergies  Allergen Reactions  . Latex Itching    Antimicrobials this admission: 2/22 zosyn >>  2/22 vancomycin >>  2/22 clindamycin >> x1 ED  Dose adjustments this admission:   Microbiology results:  BCx:   UCx:    Sputum:    MRSA PCR:   Thank you for allowing pharmacy to be a part of this patient's care.  Dorrene German 08/29/2016 10:20 PM

## 2016-08-29 NOTE — Assessment & Plan Note (Addendum)
Clinical findings are compatible with cellulitis. It is likely precipitated by some form of injury or insect bite. I discussed the importance of antibiotic therapy. I have also outlined the areas of cellulitis to make sure it does not spread. I also recommend Tylenol for mild pain and swelling and ice if needed for relief If it does not improve by the end of the week, I have asked the patient to call me back for further assessment.

## 2016-08-29 NOTE — Progress Notes (Signed)
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 Lymph node, sentinel, biopsy, right obturator ONE BENIGN LYMPH NODE (0/1) 3. Lymph nodes, regional resection, left pelvic FOUR BENIGN LYMPH NODES (0/4)      01/02/2016 Surgery    Dr. Denman George performed robotic-assisted laparoscopic total hysterectomy with bilateral salpingoophorectomy, sentinel lymph node biopsy, lymphadenectomy        05/15/2016 Pathology Results    Vagina, biopsy, mid - ADENOCARCINOMA, SEE COMMENT. Microscopic Comment The morphology along with the patient's history are consistent with recurrent endometrioid adenocarcinoma. The carcinoma has a similar appearance to the primary [id number]).      05/20/2016 Imaging    Ct scan abdomen showed solid 2.5 cm peritoneal mass in the mid to anterior left pelvis, suspicious for peritoneal metastasis. 2. Small expansile low-attenuation filling defect in the left external iliac vein, cannot exclude a small deep venous thrombus. Consider correlation with left lower extremity venous Doppler scan. 3. Small simple fluid density structure in the left pelvic sidewall abutting the left external iliac vessels, favor a small postoperative seroma. 4. No ascites. 5. No lymphadenopathy.  No metastatic disease in the chest. 6. Aortic atherosclerosis.      06/12/2016 PET scan    Intensely hypermetabolic 2.1 cm central pelvic peritoneal mass just to the left of midline, consistent with peritoneal metastatic recurrence.  No ascites. 2. No additional hypermetabolic sites of metastatic disease. 3. Diffuse thyroid hypermetabolism without discrete thyroid nodule, favoring thyroiditis. Recommend correlation with serum thyroid function tests.      06/24/2016 - 07/22/2016 Chemotherapy    The patient had weekly cisplatin. She has missed several doses due to infection and pancytopenia      06/24/2016 - 08/01/2016 Radiation Therapy    She completed concurrent radiation therapy       INTERVAL HISTORY: Please see below for problem oriented charting. She is seen urgently today because of recent findings of significant warmth and swelling around the right antecubital fossa. After recent chemotherapy, the patient was cleaning her house. She does not recall any injuries or specific insect bite. However, she noted acute swelling and pain at the antecubital site and requested urgent evaluation. She denies fever or chills.  REVIEW OF SYSTEMS:   Constitutional: Denies fevers, chills or abnormal weight loss Eyes: Denies blurriness of vision Ears, nose, mouth, throat, and face: Denies mucositis or sore throat Respiratory: Denies cough, dyspnea or wheezes Cardiovascular: Denies palpitation, chest discomfort or lower extremity swelling Gastrointestinal:  Denies nausea, heartburn or change in bowel habits Lymphatics: Denies new lymphadenopathy or easy bruising Neurological:Denies numbness, tingling or new weaknesses Behavioral/Psych: Mood is stable, no new changes  All other systems were reviewed with the patient and are negative.  I have reviewed the past medical history, past surgical history, social history and family history with the patient and they are unchanged from previous note.  ALLERGIES:  is allergic to latex.  MEDICATIONS:  Current Outpatient Prescriptions  Medication Sig Dispense Refill  . ALPRAZolam (XANAX) 1 MG tablet Take 1/2 to one tablet at bedtime as needed for insomnia and restless legs.    Marland Kitchen  B  Complex Vitamins (VITAMIN B COMPLEX) TABS Take 1 tablet by mouth daily.     . Calcium Carbonate-Vitamin D (CALTRATE COLON HEALTH PO) Take 1 tablet by mouth daily.    . chlorhexidine (PERIDEX) 0.12 % solution Use as directed 15 mLs in the mouth or throat 2 (two) times daily. 120 mL 0  . clonazePAM (KLONOPIN) 0.5 MG tablet Take 0.5 mg by mouth daily as needed (restless legs).    Marland Kitchen dexamethasone (DECADRON) 4 MG tablet Take 5 tablets with food 12 hrs and 6 hrs prior to Taxol chemotherapy. 10 tablet 6  . fluticasone (FLONASE) 50 MCG/ACT nasal spray Place 1 spray into both nostrils 2 (two) times daily as needed.  1  . gabapentin (NEURONTIN) 600 MG tablet Take 1 tablet (600 mg total) by mouth 2 (two) times daily. 60 tablet 0  . levothyroxine (SYNTHROID, LEVOTHROID) 75 MCG tablet Take 75 mcg by mouth daily before breakfast.     . loperamide (IMODIUM) 2 MG capsule Take by mouth as needed for diarrhea or loose stools.    Marland Kitchen LORazepam (ATIVAN) 1 MG tablet Place 1 tablet (1 mg total) under the tongue every 8 (eight) hours as needed (nausea). 30 tablet 0  . losartan-hydrochlorothiazide (HYZAAR) 100-12.5 MG tablet Take 1 tablet by mouth daily.    . magnesium 30 MG tablet Take 30 mg by mouth daily.     . Melatonin 3 MG TBDP Take 3 mg by mouth at bedtime.     . Multiple Vitamin (MULTIVITAMIN WITH MINERALS) TABS tablet Take 1 tablet by mouth daily.    Marland Kitchen nystatin (MYCOSTATIN/NYSTOP) powder Apply to affected area  twice a day as directed. 15 g 1  . ondansetron (ZOFRAN) 8 MG tablet Take 1 tablet (8 mg total) by mouth every 8 (eight) hours as needed for nausea. 60 tablet 1  . Potassium (POTASSIMIN) 75 MG TABS Take 75 mg by mouth daily.    . prochlorperazine (COMPAZINE) 10 MG tablet Take 1 tablet (10 mg total) by mouth every 6 (six) hours as needed for nausea. 20 tablet 0  . sulfamethoxazole-trimethoprim (BACTRIM DS,SEPTRA DS) 800-160 MG tablet Take 1 tablet by mouth 2 (two) times daily. 20 tablet 0  . traMADol (ULTRAM)  50 MG tablet Take 1 tablet (50 mg total) by mouth 3 (three) times daily as needed. 30 tablet 0  . traZODone (DESYREL) 50 MG tablet Take 50 mg by mouth at bedtime.      No current facility-administered medications for this visit.     PHYSICAL EXAMINATION: ECOG PERFORMANCE STATUS: 1 - Symptomatic but completely ambulatory  Vitals:   08/27/16 1213  BP: (!) 143/75  Pulse: 98  Resp: 18  Temp: 98.4 F (36.9 C)   Filed Weights   08/27/16 1213  Weight: 210 lb 4.8 oz (95.4 kg)    GENERAL:alert, no distress and comfortable SKIN: Significant redness, warmth and swelling is noted in the right antecubital fossa, compatible with cellulitis EYES: normal, Conjunctiva are pink and non-injected, sclera clear OROPHARYNX:no exudate, no erythema and lips, buccal mucosa, and tongue normal  NECK: supple, thyroid normal size, non-tender, without nodularity LYMPH:  no palpable lymphadenopathy in the cervical, axillary or inguinal LUNGS: clear to auscultation and percussion with normal breathing effort HEART: regular rate & rhythm and no murmurs and no lower extremity edema ABDOMEN:abdomen soft, non-tender and normal bowel sounds Musculoskeletal:no cyanosis of digits and no clubbing  NEURO: alert & oriented x 3 with fluent speech, no focal motor/sensory deficits  LABORATORY DATA:  I have reviewed the data as listed    Component Value Date/Time   NA 136 08/15/2016 0927   K 4.0 08/15/2016 0927   CL 103 01/03/2016 0454   CO2 21 (L) 08/15/2016 0927   GLUCOSE 229 (H) 08/15/2016 0927   BUN 17.0 08/15/2016 0927   CREATININE 0.8 08/15/2016 0927   CALCIUM 10.2 08/15/2016 0927   PROT 7.4 08/15/2016 0927   ALBUMIN 3.8 08/15/2016 0927   AST 14 08/15/2016 0927   ALT 19 08/15/2016 0927   ALKPHOS 163 (H) 08/15/2016 0927   BILITOT 0.48 08/15/2016 0927   GFRNONAA >60 01/03/2016 0454   GFRAA >60 01/03/2016 0454    No results found for: SPEP, UPEP  Lab Results  Component Value Date   WBC 3.8 (L)  08/15/2016   NEUTROABS 3.2 08/15/2016   HGB 11.7 08/15/2016   HCT 34.0 (L) 08/15/2016   MCV 82.9 08/15/2016   PLT 200 08/15/2016      Chemistry      Component Value Date/Time   NA 136 08/15/2016 0927   K 4.0 08/15/2016 0927   CL 103 01/03/2016 0454   CO2 21 (L) 08/15/2016 0927   BUN 17.0 08/15/2016 0927   CREATININE 0.8 08/15/2016 0927      Component Value Date/Time   CALCIUM 10.2 08/15/2016 0927   ALKPHOS 163 (H) 08/15/2016 0927   AST 14 08/15/2016 0927   ALT 19 08/15/2016 0927   BILITOT 0.48 08/15/2016 0927         ASSESSMENT & PLAN:  Cellulitis of antecubital fossa Clinical findings are compatible with cellulitis. It is likely precipitated by some form of injury or insect bite. I discussed the importance of antibiotic therapy. I have also outlined the areas of cellulitis to make sure it does not spread. If it does not improve by the end of the week, I have asked the patient to call me back for further assessment.  Recurrent carcinoma of endometrium [facility]) She has received chemotherapy recently.  Continue supportive care   No orders of the defined types were placed in this encounter.  All questions were answered. The patient knows to call the clinic with any problems, questions or concerns. No barriers to learning was detected. I spent 15 minutes counseling the patient face to face. The total time spent in the appointment was 20 minutes and more than 50% was on counseling and review of test results     Heath Lark, MD 08/29/2016 6:51 AM

## 2016-08-29 NOTE — ED Provider Notes (Signed)
Emergency Department Provider Note   I have reviewed the triage vital signs and the nursing notes.   HISTORY  Chief Complaint arm infection and Insect Bite   HPI Michelle Rosario is a 71 y.o. female with PMH of HTN and uterine cancer who is currently on chemotherapy and radiation presents to the emergency department for evaluation of erythema and pain in the right axilla. The patient states that 4 days ago she was helping her son clean out a shed later in the day she noticed some pain and redness in her arm. She saw her PCP on Monday (4 days prior) who started her on Bactrim. That provider circled area on her arm and the patient sees red streaking was not present before and feels that the redness is spreading. She is also complaining of worsening pain is worse with movement or palpation of the area. She denies any fever, chills, flulike symptoms. No drainage from the wound. No similar areas like this in the past   Past Medical History:  Diagnosis Date  . Broken ribs    hx of  . Family history of adverse reaction to anesthesia    mother post op N&V  . Hypertension   . Hypothyroidism   . Uterine cancer [facility])    May 30 , 2017    Patient Active Problem List   Diagnosis Date Noted  . Cellulitis of upper extremity 08/29/2016  . Cellulitis of right upper extremity 08/29/2016  . Cellulitis of antecubital fossa 08/27/2016  . Pancytopenia, acquired ([place]) 08/15/2016  . IV infiltration 08/15/2016  . Thoracic aorta atherosclerosis ([place]) 05/29/2016  . Hypothyroidism 05/29/2016  . Essential hypertension 05/29/2016  . 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 achellis tendon repaired in 2005    . ANKLE SURGERY Right Oct 2006  . fisure  2014  . LYMPH NODE BIOPSY N/A 01/02/2016   Procedure: SENTINAL LYMPH NODE BIOPSY;  Surgeon: Everitt Amber, MD;  Location: [facility] ORS;  Service: Gynecology;  Laterality: N/A;  . rectal fissure  surgery 2010    . REPLACEMENT TOTAL KNEE Left 2015  . ROBOTIC ASSISTED TOTAL HYSTERECTOMY WITH BILATERAL SALPINGO OOPHERECTOMY Bilateral 01/02/2016   Procedure: XI ROBOTIC ASSISTED TOTAL LAPAROSCOPIC HYSTERECTOMY WITH BILATERAL SALPINGO OOPHORECTOMY;  Surgeon: Everitt Amber, MD;  Location: [facility];  Service: Gynecology;  Laterality: Bilateral;  . TUBAL LIGATION        Allergies Latex  Family History  Problem Relation Age of Onset  . Anesthesia problems Mother   . Cancer Mother     uterine cancer  . Cancer Father     bladder cancer  . Diabetes Sister     Social History Social History  Substance Use Topics  . Smoking status: Never Smoker  . Smokeless tobacco: Never Used  . Alcohol use Yes     Comment: socially    Review of Systems  Constitutional: No fever/chills Eyes: No visual changes. ENT: No sore throat. Cardiovascular: Denies chest pain. Respiratory: Denies shortness of breath. Gastrointestinal: No abdominal pain.  No nausea, no vomiting.  No diarrhea.  No constipation. Genitourinary: Negative for dysuria. Musculoskeletal: Negative for back pain. Skin: Right arm redness and swelling.  Neurological: Negative for headaches, focal weakness or numbness.  10-point ROS otherwise negative.  ____________________________________________   PHYSICAL EXAM:  VITAL SIGNS: ED Triage Vitals  Enc Vitals Group     BP 08/29/16 1702 144/79     Pulse Rate 08/29/16 1702  81     Resp 08/29/16 1702 16     Temp 08/29/16 1702 98.8 F (37.1 C)     Temp Source 08/29/16 1702 Oral     SpO2 08/29/16 1702 97 %     Weight 08/29/16 1702 209 lb 9 oz (95.1 kg)     Height 08/29/16 1702 5\' 6"  (1.676 m)     Pain Score 08/29/16 1724 10   Constitutional: Alert and oriented. Well appearing and in no acute distress. Eyes: Conjunctivae are normal.  Head: Atraumatic. Nose: No congestion/rhinnorhea. Mouth/Throat: Mucous membranes are moist.  Oropharynx non-erythematous. Neck: No stridor.     Cardiovascular: Normal rate, regular rhythm. Good peripheral circulation. Grossly normal heart sounds.   Respiratory: Normal respiratory effort.  No retractions. Lungs CTAB. Gastrointestinal: Soft and nontender. No distention.  Musculoskeletal: No lower extremity tenderness nor edema. No gross deformities of extremities. Neurologic:  Normal speech and language. No gross focal neurologic deficits are appreciated.  Skin:  Skin is warm, dry and intact. 4 x 5 cm area of induration in the right AC with surrounding erythema and warmth. No drainage.  Psychiatric: Mood and affect are normal. Speech and behavior are normal.  ____________________________________________   LABS (all labs ordered are listed, but only abnormal results are displayed)  Labs Reviewed  COMPREHENSIVE METABOLIC PANEL - Abnormal; Notable for the following:       Result Value   Albumin 3.3 (*)    Alkaline Phosphatase 154 (*)    All other components within normal limits  CBC WITH DIFFERENTIAL/PLATELET - Abnormal; Notable for the following:    RBC 3.12 (*)    Hemoglobin 8.8 (*)    HCT 26.2 (*)    RDW 16.4 (*)    All other components within normal limits  CBC - Abnormal; Notable for the following:    RBC 2.96 (*)    Hemoglobin 8.6 (*)    HCT 25.0 (*)    RDW 16.4 (*)    All other components within normal limits  CULTURE, BLOOD (ROUTINE X 2)  CULTURE, BLOOD (ROUTINE X 2)  BASIC METABOLIC PANEL  I-STAT CG4 LACTIC ACID, ED   ____________________________________________  RADIOLOGY  Ct [place] Right W Contrast  Result Date: 08/30/2016 CLINICAL DATA:  Redness to the anterior right elbow EXAM: CT OF THE UPPER RIGHT EXTREMITY WITH CONTRAST TECHNIQUE: Multidetector CT imaging of the upper right extremity was performed according to the standard protocol following intravenous contrast administration. COMPARISON:  None. CONTRAST:  162mL ISOVUE-300 IOPAMIDOL (ISOVUE-300) INJECTION 61%<Contrast>110mL ISOVUE-300 IOPAMIDOL  (ISOVUE-300) INJECTION 61% FINDINGS: Bones/Joint/Cartilage No periostitis or fracture.  No large elbow effusion. Ligaments Suboptimally assessed by CT. Muscles and Tendons Normal muscle bulk. No masses. There is fluid and edema around the muscle bellies of the proximal forearm. Soft tissues Moderate fluid, skin thickening and edema posterior aspect of the elbow. There is mild diffuse subcutaneous edema of the distal arm, elbow, and proximal forearm. There is suspected filling defect within a superficial vein along the radial aspect of the proximal forearm near the antecubital fossa. Vessel appears expanded and there is moderate surrounding inflammation. Suspected thrombus extends into what appears to be cephalic vein at the distal arm. IMPRESSION: 1. Skin thickening and moderate fluid and subcutaneous edema within the soft tissues of the distal upper arm, elbow and proximal forearm which may represent a cellulitis. No definite rim enhancing collections to suggest abscess. 2. Suspect thrombophlebitis/thrombosis of superficial vein of the proximal forearm with possible thrombus in the cephalic vein. Further evaluation  with venous ultrasound is recommended. Electronically Signed   By: Donavan Foil M.D.   On: 08/30/2016 01:43    ____________________________________________   PROCEDURES  Procedure(s) performed:   Procedures  ULTRASOUND LIMITED SOFT TISSUE/ MUSCULOSKELETAL:  Indication: RUE cellulitis and pain Linear probe used to evaluate area of interest in two planes. Findings:  No clear fluid collection or abscess. Cobblestone appearance of sub-q tissue suggesting cellulitis.  Performed by: Dr Laverta Baltimore Images saved electronically  ____________________________________________   INITIAL IMPRESSION / [place] / ED COURSE  Pertinent labs & imaging results that were available during my care of the patient were reviewed by me and considered in my medical decision making (see chart for  details).  Patient resents to the emergency department for evaluation of erythema, redness, pain in the right [facility] that is worsening despite several days of Bactrim. She is on chemotherapy and radiation for uterine cancer currently. Patient is afebrile at home. She is overall well-appearing. Bedside ultrasound shows evidence of cellulitis with no drainable abscess.   Labs are largely unremarkable. With streaking and worsening cellulitis and pain in the setting of active chemotherapy and radiation I have asked the hospitalist to observe overnight with IV abx.   Discussed patient's case with hospitlaist, Dr. Hal Hope.  Care transferred to hospitalist service. Patient updated at bedside.   I reviewed all nursing notes, vitals, pertinent old records, EKGs, labs, imaging (as available).   ____________________________________________  FINAL CLINICAL IMPRESSION(S) / ED DIAGNOSES  Final diagnoses:  Cellulitis of right lower extremity     MEDICATIONS GIVEN DURING THIS VISIT:  Medications  oxyCODONE (Oxy IR/ROXICODONE) immediate release tablet 5 mg (5 mg Oral Given 08/30/16 1024)  0.9 %  sodium chloride infusion ( Intravenous New Bag/Given 08/29/16 2326)  levothyroxine (SYNTHROID, LEVOTHROID) tablet 88 mcg (88 mcg Oral Given 08/30/16 0804)  gabapentin (NEURONTIN) capsule 600 mg (600 mg Oral Given 08/30/16 1021)  prochlorperazine (COMPAZINE) tablet 10 mg (not administered)  chlorhexidine (PERIDEX) 0.12 % solution 15 mL (15 mLs Mouth/Throat Given 08/30/16 1021)  fluticasone (FLONASE) 50 MCG/ACT nasal spray 1 spray (not administered)  ALPRAZolam (XANAX) tablet 0.5 mg (not administered)  clonazePAM (KLONOPIN) tablet 0.5 mg (not administered)  multivitamin with minerals tablet 1 tablet (1 tablet Oral Given 08/30/16 1020)  potassium chloride (K-DUR,KLOR-CON) CR tablet 5 mEq (5 mEq Oral Given 08/30/16 1022)  B-complex with vitamin C tablet 1 tablet (1 tablet Oral Given 08/30/16 1020)  magnesium oxide  (MAG-OX) tablet 200 mg (200 mg Oral Given 08/30/16 1024)  traZODone (DESYREL) tablet 50 mg (50 mg Oral Given 08/29/16 2328)  losartan (COZAAR) tablet 100 mg (100 mg Oral Given 08/30/16 1021)  acetaminophen (TYLENOL) tablet 650 mg (650 mg Oral Given 08/30/16 0724)    Or  acetaminophen (TYLENOL) suppository 650 mg ( Rectal See Alternative 08/30/16 0724)  ondansetron (ZOFRAN) tablet 4 mg (not administered)    Or  ondansetron (ZOFRAN) injection 4 mg (not administered)  piperacillin-tazobactam (ZOSYN) IVPB 3.375 g (3.375 g Intravenous Given 08/30/16 0700)  vancomycin (VANCOCIN) IVPB 1000 mg/200 mL premix (not administered)  diclofenac sodium (VOLTAREN) 1 % transdermal gel 2 g (not administered)  oxyCODONE-acetaminophen (PERCOCET/ROXICET) 5-325 MG per tablet 1 tablet (1 tablet Oral Given 08/29/16 1817)  clindamycin (CLEOCIN) IVPB 600 mg (600 mg Intravenous New Bag/Given 08/29/16 2115)  piperacillin-tazobactam (ZOSYN) IVPB 3.375 g (3.375 g Intravenous Given 08/29/16 2330)  iopamidol (ISOVUE-300) 61 % injection 100 mL (100 mLs Intravenous Contrast Given 08/30/16 0034)     NEW OUTPATIENT MEDICATIONS STARTED DURING  THIS VISIT:  None   Note:  This document was prepared using Dragon voice recognition software and may include unintentional dictation errors.  Nanda Quinton, MD Emergency Medicine   Margette Fast, MD 08/30/16 [phone]

## 2016-08-29 NOTE — H&P (Addendum)
History and Physical    Michelle Rosario [id number] DOB: 1946/02/23 DOA: 08/29/2016  PCP: Woody Seller, MD  Patient coming from: Home.  Chief Complaint: Right elbow erythema and swelling.  HPI: Michelle Rosario is a 71 y.o. female with history of endometrial carcinoma on chemotherapy and radiation last radiation was 2 days ago and chemotherapy was 2 weeks ago presents to the ER because of worsening swelling on the right antecubital fossa. Patient noticed the erythema and swelling 3 days ago and was prescribed Bactrim by patient's oncologist. Despite taking the swelling has worsened. Denies any fever or chills.    ED Course: ER physician had done bedside ultrasound which did not show any definite drainable abscess. Patient was started on empiric antibiotics and admitted for further observation. Patient states she has had at least 2 episodes of skin infection over the last 1 month and had taken 2 courses of antibiotics.  Review of Systems: As per HPI, rest all negative.   Past Medical History:  Diagnosis Date  . Broken ribs    hx of  . Family history of adverse reaction to anesthesia    mother post op N&V  . Hypertension   . Hypothyroidism   . Uterine cancer [facility])    May 30 , 2017    Past Surgical History:  Procedure Laterality Date  . achellis tendon repaired in 2005    . ANKLE SURGERY Right Oct 2006  . fisure  2014  . LYMPH NODE BIOPSY N/A 01/02/2016   Procedure: SENTINAL LYMPH NODE BIOPSY;  Surgeon: Everitt Amber, MD;  Location: [facility];  Service: Gynecology;  Laterality: N/A;  . rectal fissure surgery 2010    . REPLACEMENT TOTAL KNEE Left 2015  . ROBOTIC ASSISTED TOTAL HYSTERECTOMY WITH BILATERAL SALPINGO OOPHERECTOMY Bilateral 01/02/2016   Procedure: XI ROBOTIC ASSISTED TOTAL LAPAROSCOPIC HYSTERECTOMY WITH BILATERAL SALPINGO OOPHORECTOMY;  Surgeon: Everitt Amber, MD;  Location: [facility];  Service: Gynecology;  Laterality: Bilateral;  . TUBAL LIGATION       reports that she  has never smoked. She has never used smokeless tobacco. She reports that she drinks alcohol. She reports that she does not use drugs.  Allergies  Allergen Reactions  . Latex Itching    Family History  Problem Relation Age of Onset  . Anesthesia problems Mother   . Cancer Mother     uterine cancer  . Cancer Father     bladder cancer  . Diabetes Sister     Prior to Admission medications   Medication Sig Start Date End Date Taking? Authorizing Provider  ALPRAZolam Duanne Moron) 1 MG tablet Take 1/2 to one tablet at bedtime as needed for insomnia and restless legs. 02/09/16  Yes Historical Provider, MD  B Complex Vitamins (VITAMIN B COMPLEX) TABS Take 1 tablet by mouth daily.    Yes Historical Provider, MD  Calcium Carbonate-Vitamin D (CALTRATE COLON HEALTH PO) Take 1 tablet by mouth daily.   Yes Historical Provider, MD  chlorhexidine (PERIDEX) 0.12 % solution Use as directed 15 mLs in the mouth or throat 2 (two) times daily. 07/29/16  Yes Lennis Marion Downer, MD  clonazePAM (KLONOPIN) 0.5 MG tablet Take 0.5 mg by mouth daily as needed (restless legs).   Yes Historical Provider, MD  dexamethasone (DECADRON) 4 MG tablet Take 5 tablets with food 12 hrs and 6 hrs prior to Taxol chemotherapy. 08/20/16  Yes Heath Lark, MD  fluticasone (FLONASE) 50 MCG/ACT nasal spray Place 1 spray into both nostrils 2 (two) times  daily as needed. 06/10/16  Yes Historical Provider, MD  gabapentin (NEURONTIN) 600 MG tablet Take 1 tablet (600 mg total) by mouth 2 (two) times daily. 08/23/16  Yes Heath Lark, MD  HYDROCODONE-ACETAMINOPHEN PO Take 1 tablet by mouth as needed (pain).   Yes Historical Provider, MD  levothyroxine (SYNTHROID, LEVOTHROID) 88 MCG tablet Take 88 mcg by mouth daily. 08/12/16  Yes Historical Provider, MD  loperamide (IMODIUM) 2 MG capsule Take by mouth as needed for diarrhea or loose stools.   Yes Historical Provider, MD  LORazepam (ATIVAN) 1 MG tablet Place 1 tablet (1 mg total) under the tongue every 8  (eight) hours as needed (nausea). 07/16/16  Yes Gery Pray, MD  losartan-hydrochlorothiazide (HYZAAR) 100-12.5 MG tablet Take 1 tablet by mouth daily.   Yes Historical Provider, MD  magnesium 30 MG tablet Take 30 mg by mouth daily.    Yes Historical Provider, MD  Melatonin 3 MG TBDP Take 3 mg by mouth at bedtime.    Yes Historical Provider, MD  Multiple Vitamin (MULTIVITAMIN WITH MINERALS) TABS tablet Take 1 tablet by mouth daily.   Yes Historical Provider, MD  nystatin (MYCOSTATIN/NYSTOP) powder Apply to affected area  twice a day as directed. 07/29/16  Yes Lennis P Livesay, MD  ondansetron (ZOFRAN) 8 MG tablet Take 1 tablet (8 mg total) by mouth every 8 (eight) hours as needed for nausea. 08/13/16  Yes Heath Lark, MD  Potassium (POTASSIMIN) 75 MG TABS Take 75 mg by mouth daily.   Yes Historical Provider, MD  prochlorperazine (COMPAZINE) 10 MG tablet Take 1 tablet (10 mg total) by mouth every 6 (six) hours as needed for nausea. 08/02/16  Yes Lennis Marion Downer, MD  sulfamethoxazole-trimethoprim (BACTRIM DS,SEPTRA DS) 800-160 MG tablet Take 1 tablet by mouth 2 (two) times daily. 08/27/16  Yes Heath Lark, MD  traMADol (ULTRAM) 50 MG tablet Take 1 tablet (50 mg total) by mouth 3 (three) times daily as needed. 08/23/16  Yes Heath Lark, MD  traZODone (DESYREL) 50 MG tablet Take 50 mg by mouth at bedtime.    Yes Historical Provider, MD    Physical Exam: Vitals:   08/29/16 1702 08/29/16 1929 08/29/16 1930 08/29/16 2150  BP: 144/79 146/84 146/84 149/87  Pulse: 81 80 80 85  Resp: 16 17    Temp: 98.8 F (37.1 C)     TempSrc: Oral     SpO2: 97% 98% 99% 100%  Weight: 95.1 kg (209 lb 9 oz)     Height: 5\' 6"  (1.676 m)         Constitutional: Moderately built and nourished. Vitals:   08/29/16 1702 08/29/16 1929 08/29/16 1930 08/29/16 2150  BP: 144/79 146/84 146/84 149/87  Pulse: 81 80 80 85  Resp: 16 17    Temp: 98.8 F (37.1 C)     TempSrc: Oral     SpO2: 97% 98% 99% 100%  Weight: 95.1 kg (209 lb  9 oz)     Height: 5\' 6"  (1.676 m)      Eyes: Anicteric no pallor. ENMT: No discharge from the ears eyes nose and mouth. Neck: No mass felt. No neck rigidity. Respiratory: No rhonchi or crepitations. Cardiovascular: S1 and S2 heard. No murmurs appreciated. Abdomen: Soft nontender bowel sounds present. Musculoskeletal: Patient unable to completely extend the right elbow. Skin: Erythema over the right elbow extending up to the mid arm and forearm. Neurologic: Alert awake oriented to time place and person. Moves all extremities. Psychiatric: Appears normal. Normal affect.  Labs on Admission: I have personally reviewed following labs and imaging studies  CBC:  Recent Labs Lab 08/29/16 1810  WBC 6.0  NEUTROABS 4.3  HGB 8.8*  HCT 26.2*  MCV 84.0  PLT 123456   Basic Metabolic Panel:  Recent Labs Lab 08/29/16 1810  NA 136  K 3.6  CL 104  CO2 25  GLUCOSE 95  BUN 15  CREATININE 0.75  CALCIUM 9.2   GFR: Estimated Creatinine Clearance: 77.1 mL/min (by C-G formula based on SCr of 0.75 mg/dL). Liver Function Tests:  Recent Labs Lab 08/29/16 1810  AST 23  ALT 26  ALKPHOS 154*  BILITOT 0.3  PROT 6.5  ALBUMIN 3.3*   No results for input(s): LIPASE, AMYLASE in the last 168 hours. No results for input(s): AMMONIA in the last 168 hours. Coagulation Profile: No results for input(s): INR, PROTIME in the last 168 hours. Cardiac Enzymes: No results for input(s): CKTOTAL, CKMB, CKMBINDEX, TROPONINI in the last 168 hours. BNP (last 3 results) No results for input(s): PROBNP in the last 8760 hours. HbA1C: No results for input(s): HGBA1C in the last 72 hours. CBG: No results for input(s): GLUCAP in the last 168 hours. Lipid Profile: No results for input(s): CHOL, HDL, LDLCALC, TRIG, CHOLHDL, LDLDIRECT in the last 72 hours. Thyroid Function Tests: No results for input(s): TSH, T4TOTAL, FREET4, T3FREE, THYROIDAB in the last 72 hours. Anemia Panel: No results for input(s):  VITAMINB12, FOLATE, FERRITIN, TIBC, IRON, RETICCTPCT in the last 72 hours. Urine analysis:    Component Value Date/Time   COLORURINE YELLOW 01/01/2016 1321   APPEARANCEUR CLOUDY (A) 01/01/2016 1321   LABSPEC 1.020 08/13/2016 1226   PHURINE 6.5 08/13/2016 1226   PHURINE 7.0 01/01/2016 1321   GLUCOSEU Negative 08/13/2016 1226   HGBUR Moderate 08/13/2016 1226   HGBUR MODERATE (A) 01/01/2016 1321   BILIRUBINUR Negative 08/13/2016 1226   KETONESUR Negative 08/13/2016 1226   KETONESUR NEGATIVE 01/01/2016 1321   PROTEINUR < 30 08/13/2016 1226   PROTEINUR 30 (A) 01/01/2016 1321   UROBILINOGEN 0.2 08/13/2016 1226   NITRITE Negative 08/13/2016 1226   NITRITE NEGATIVE 01/01/2016 1321   LEUKOCYTESUR Negative 08/13/2016 1226   Sepsis Labs: @LABRCNTIP (procalcitonin:4,lacticidven:4) )No results found for this or any previous visit (from the past 240 hour(s)).   Radiological Exams on Admission: No results found.   Assessment/Plan Principal Problem:   Cellulitis of upper extremity Active Problems:   Recurrent carcinoma of endometrium (HCC)   Hypothyroidism   Essential hypertension   Cellulitis of right upper extremity    1. Cellulitis involving the right antecubital fossa - since patient is not able to fully extend her right forearm I have ordered a CT of the right elbow with contrast to rule out any deep abscess. Patient is placed on vancomycin and Zosyn. Further recommendation based on the clinical response and CAT scan findings. 2. Hypertension - we'll continue Cozaar but hold hydrochlorothiazide since patient is receiving fluids. 3. Hypothyroidism on Synthroid. 4. Endometrial carcinoma being followed by oncologist.   DVT prophylaxis: SCDs. Code Status: Full code.  Family Communication: Discussed with patient.  Disposition Plan: Home.  Consults called: None.  Admission status: Observation.    Rise Patience MD [facility] Pager [phone].  If 7PM-7AM,  please contact night-coverage [email or web address] Password [facility]  08/29/2016, 10:01 PM

## 2016-08-29 NOTE — Telephone Encounter (Signed)
Pt called reporting the Right arm still red and swollen.  Pt was given antibiotics by Dr. Alvy Bimler since Tuesday 08/27/16.  Stated now the redness and swelling have moved down to the wrist area; very painful, very hot to touch.  Pt unable to use Right arm at all, and unable to raise arm either.   Dr. Alvy Bimler notified.  Spoke with pt again, and instructed pt to go to [facility] ER for further evaluation for possible IV antibiotics as per md.  Pt voiced understanding, and stated she will go now to ER. Pt's   Phone     [phone].

## 2016-08-29 NOTE — ED Notes (Signed)
Attempted to call report to floor 

## 2016-08-29 NOTE — Progress Notes (Signed)
PHARMACIST - PHYSICIAN ORDER COMMUNICATION  CONCERNING: P&T Medication Policy on Herbal Medications  DESCRIPTION:  This patient's order for:  Melatonin has been noted.  This product(s) is classified as an "herbal" or natural product. Due to a lack of definitive safety studies or FDA approval, nonstandard manufacturing practices, plus the potential risk of unknown drug-drug interactions while on inpatient medications, the Pharmacy and Therapeutics Committee does not permit the use of "herbal" or natural products of this type within [facility].   ACTION TAKEN: The pharmacy department is unable to verify this order at this time and your patient has been informed of this safety policy. Please reevaluate patient's clinical condition at discharge and address if the herbal or natural product(s) should be resumed at that time.   Thanks Dorrene German 08/29/2016 10:39 PM

## 2016-08-29 NOTE — ED Triage Notes (Signed)
Patient states that she went to doctor on Tuesday for possible bug bite and marked her right arm and started her on antibiotics. Patient states that pain has gotten worse as well as swelling into lower and posterior arm.

## 2016-08-29 NOTE — Assessment & Plan Note (Signed)
She has received chemotherapy recently.  Continue supportive care

## 2016-08-30 ENCOUNTER — Encounter (HOSPITAL_COMMUNITY): Payer: Self-pay

## 2016-08-30 ENCOUNTER — Encounter (HOSPITAL_COMMUNITY): Payer: Medicare Other

## 2016-08-30 ENCOUNTER — Observation Stay (HOSPITAL_COMMUNITY): Payer: Medicare Other

## 2016-08-30 DIAGNOSIS — C541 Malignant neoplasm of endometrium: Secondary | ICD-10-CM

## 2016-08-30 DIAGNOSIS — F419 Anxiety disorder, unspecified: Secondary | ICD-10-CM | POA: Diagnosis present

## 2016-08-30 DIAGNOSIS — L03113 Cellulitis of right upper limb: Principal | ICD-10-CM

## 2016-08-30 DIAGNOSIS — E039 Hypothyroidism, unspecified: Secondary | ICD-10-CM

## 2016-08-30 DIAGNOSIS — Z833 Family history of diabetes mellitus: Secondary | ICD-10-CM | POA: Diagnosis not present

## 2016-08-30 DIAGNOSIS — Z7952 Long term (current) use of systemic steroids: Secondary | ICD-10-CM | POA: Diagnosis not present

## 2016-08-30 DIAGNOSIS — Z923 Personal history of irradiation: Secondary | ICD-10-CM | POA: Diagnosis not present

## 2016-08-30 DIAGNOSIS — D638 Anemia in other chronic diseases classified elsewhere: Secondary | ICD-10-CM | POA: Diagnosis present

## 2016-08-30 DIAGNOSIS — Z79899 Other long term (current) drug therapy: Secondary | ICD-10-CM | POA: Diagnosis not present

## 2016-08-30 DIAGNOSIS — M7989 Other specified soft tissue disorders: Secondary | ICD-10-CM | POA: Diagnosis present

## 2016-08-30 DIAGNOSIS — G4700 Insomnia, unspecified: Secondary | ICD-10-CM | POA: Diagnosis present

## 2016-08-30 DIAGNOSIS — Z91040 Latex allergy status: Secondary | ICD-10-CM | POA: Diagnosis not present

## 2016-08-30 DIAGNOSIS — I808 Phlebitis and thrombophlebitis of other sites: Secondary | ICD-10-CM

## 2016-08-30 DIAGNOSIS — I10 Essential (primary) hypertension: Secondary | ICD-10-CM | POA: Diagnosis present

## 2016-08-30 DIAGNOSIS — Z8052 Family history of malignant neoplasm of bladder: Secondary | ICD-10-CM | POA: Diagnosis not present

## 2016-08-30 DIAGNOSIS — Z8049 Family history of malignant neoplasm of other genital organs: Secondary | ICD-10-CM | POA: Diagnosis not present

## 2016-08-30 DIAGNOSIS — W57XXXA Bitten or stung by nonvenomous insect and other nonvenomous arthropods, initial encounter: Secondary | ICD-10-CM | POA: Diagnosis present

## 2016-08-30 LAB — CBC
HEMATOCRIT: 25 % — AB (ref 36.0–46.0)
HEMOGLOBIN: 8.6 g/dL — AB (ref 12.0–15.0)
MCH: 29.1 pg (ref 26.0–34.0)
MCHC: 34.4 g/dL (ref 30.0–36.0)
MCV: 84.5 fL (ref 78.0–100.0)
Platelets: 157 10*3/uL (ref 150–400)
RBC: 2.96 MIL/uL — AB (ref 3.87–5.11)
RDW: 16.4 % — ABNORMAL HIGH (ref 11.5–15.5)
WBC: 4.3 10*3/uL (ref 4.0–10.5)

## 2016-08-30 LAB — BASIC METABOLIC PANEL
ANION GAP: 7 (ref 5–15)
BUN: 13 mg/dL (ref 6–20)
CHLORIDE: 106 mmol/L (ref 101–111)
CO2: 25 mmol/L (ref 22–32)
CREATININE: 0.78 mg/dL (ref 0.44–1.00)
Calcium: 9 mg/dL (ref 8.9–10.3)
GFR calc non Af Amer: >60 mL/min (ref >60)
Glucose, Bld: 89 mg/dL (ref 65–99)
POTASSIUM: 3.8 mmol/L (ref 3.5–5.1)
SODIUM: 138 mmol/L (ref 135–145)

## 2016-08-30 MED ORDER — MORPHINE SULFATE (PF) 4 MG/ML IV SOLN
1.0000 mg | Freq: Once | INTRAVENOUS | Status: AC
  Administered 2016-08-30: 1 mg via INTRAVENOUS
  Filled 2016-08-30: qty 1

## 2016-08-30 MED ORDER — IOPAMIDOL (ISOVUE-300) INJECTION 61%
100.0000 mL | Freq: Once | INTRAVENOUS | Status: AC | PRN
  Administered 2016-08-30: 100 mL via INTRAVENOUS

## 2016-08-30 MED ORDER — VANCOMYCIN HCL IN DEXTROSE 1-5 GM/200ML-% IV SOLN
1000.0000 mg | Freq: Two times a day (BID) | INTRAVENOUS | Status: DC
  Administered 2016-08-30 (×2): 1000 mg via INTRAVENOUS
  Filled 2016-08-30 (×2): qty 200

## 2016-08-30 MED ORDER — DICLOFENAC SODIUM 1 % TD GEL
2.0000 g | Freq: Four times a day (QID) | TRANSDERMAL | Status: DC
  Administered 2016-08-30 – 2016-08-31 (×4): 2 g via TOPICAL
  Filled 2016-08-30: qty 100

## 2016-08-30 MED ORDER — PREMIER PROTEIN SHAKE
11.0000 [oz_av] | Freq: Two times a day (BID) | ORAL | Status: DC
  Administered 2016-08-30 – 2016-08-31 (×2): 11 [oz_av] via ORAL
  Filled 2016-08-30 (×3): qty 325.31

## 2016-08-30 NOTE — Progress Notes (Signed)
Pharmacy Antibiotic Note  Michelle Rosario is a 71 y.o. female c/o big bite started on Septra on 2/20 admitted on 08/29/2016 with cellulitis.  Pharmacy has been consulted for zosyn/vancomycin dosing.  Plan: Zosyn 3.375g IV q8h (4 hour infusion).  Will change vanc from 1250mg  q12 to 1g q12 and change goal trough to 15-20 to 10-15 mcg/mL for cellulitis only Daily SCr/f/u cultures/levels as needed  Height: 5\' 6"  (167.6 cm) Weight: 209 lb 9 oz (95.1 kg) IBW/kg (Calculated) : 59.3  Temp (24hrs), Avg:98.7 F (37.1 C), Min:98.4 F (36.9 C), Max:99 F (37.2 C)   Recent Labs Lab 08/29/16 1810 08/29/16 1814 08/30/16 0527  WBC 6.0  --  4.3  CREATININE 0.75  --  0.78  LATICACIDVEN  --  0.78  --     Estimated Creatinine Clearance: 77.1 mL/min (by C-G formula based on SCr of 0.78 mg/dL).    Allergies  Allergen Reactions  . Latex Itching    Antimicrobials this admission: 2/22 zosyn >>  2/22 vancomycin >>  2/22 clindamycin >> x1 ED  Dose adjustments this admission:   Microbiology results: 2/22 blood: sent  Thank you for allowing pharmacy to be a part of this patient's care.   Adrian Saran, PharmD, BCPS Pager [phone] 08/30/2016 8:48 AM

## 2016-08-30 NOTE — Progress Notes (Signed)
Initial Nutrition Assessment  DOCUMENTATION CODES:   Obesity unspecified  INTERVENTION:  Premier Protein BID, each supplement provides 160kcal and 30g protein.   MVI  snacks  NUTRITION DIAGNOSIS:   Increased nutrient needs related to cancer and cancer related treatments, wound healing as evidenced by increased estimated needs from protein and 4 percent weight loss in 6 weeks.  GOAL:   Patient will meet greater than or equal to 90% of their needs  MONITOR:   PO intake, Supplement acceptance, Weight trends, Labs, Skin  REASON FOR ASSESSMENT:   Malnutrition Screening Tool    ASSESSMENT:   71 y.o. female with history of endometrial carcinoma on chemotherapy and radiation last radiation was 2 days ago and chemotherapy was 2 weeks ago presents to the ER because of worsening swelling on the right antecubital fossa. Patient noticed the erythema and swelling 3 days ago and was prescribed Bactrim by patient's oncologist. Despite taking the swelling has worsened.    Met with pt in room today. Pt reports intermittent poor appetite and oral intake pta. Pt with endometrial cancer and currently undergoing radiation therapy and chemotherapy. Per chart, pt has lost 8lbs(4%) in 6 weeks. This is not too significant but worth noting given history. Pt is currently eating 90% meals. RD discussed with pt the importance of adequate protein intake for wound healing as well as to preserve lean muscle while she is undergoing cancer therapies. Pt would like to try premier protein; RD will order.   Medications reviewed and include: B complex w/ Vit C, synthroid, Mg Oxide, MVI, zosyn, KCl, vancomycin, oxycodone  Labs reviewed: Alk Phos 154(H), Alb 3.3(L)- 2/22 Hgb 8.6(L), Hct 25(L)  Nutrition-Focused physical exam completed. Findings are no fat depletion, no muscle depletion, and no edema.   Diet Order:  Diet Heart Room service appropriate? Yes; Fluid consistency: Thin  Skin:  Reviewed, no  issues  Last BM:  2/22  Height:   Ht Readings from Last 1 Encounters:  08/29/16 _0  (1.676 m)    Weight:   Wt Readings from Last 1 Encounters:  08/29/16 209 lb 9 oz (95.1 kg)    Ideal Body Weight:  59 kg  BMI:  Body mass index is 33.82 kg/m.  Estimated Nutritional Needs:   Kcal:  1900-2200kcal/day   Protein:  95-114g/day   Fluid:  >1.9L/day   EDUCATION NEEDS:   No education needs identified at this time  Koleen Distance, RD, LDN Pager #[phone]

## 2016-08-30 NOTE — Progress Notes (Signed)
Pt arrived to unit room 1508 via stretcher. Alert and oriented x 4. Steady gait from stretcher to bed. VS taken. Pain 3/10 to R arm. Pt oriented to room and callbell with no complication. Pt guide at the bedside. Initial assessment completed. Will continue to monitor

## 2016-08-30 NOTE — Progress Notes (Signed)
PROGRESS NOTE    Michelle Rosario   [id number]  DOB: [birth year]  DOA: 08/29/2016 PCP: Woody Seller, MD   Brief Narrative:  Michelle Rosario is a 71 y.o. female with history of endometrial carcinoma on chemotherapy and radiation last radiation was 2 days ago and chemotherapy was 2 weeks ago presents to the ER because of worsening swelling on the right antecubital fossa. Patient noticed the erythema and swelling on 5-6 days ago and was prescribed Bactrim by her oncologist. Despite taking it, the swelling has worsened. Denies any fever or chills. Patient states she has had at least 2 episodes of skin infection over the last 1 month and had taken 2 courses of antibiotics. ER physician had done bedside ultrasound which did not show any definite drainable abscess.  Subjective: Swelling has improved a great deal. Still has pain however.   Assessment & Plan:   Principal Problem:   Cellulitis of upper extremity - right antecubital and surrounding region about 5-6 in in length - started on Vanc and Zosyn as she is immunosuppressed from chemo - has had near resolution of erythema- remaining erythema is directly over antecubital area and is small- Voltaren gel ordered for pain in addition to home dose of Oxycodone - no abscess noted on bedside ultrasound and CT  Superficial thrombophlebitis - of superficial vein in arm and possibly cephalic vein - Voltaren gel  - ice pack - obtaining Venous to duplex to ensure no DVT  Active Problems:   Recurrent carcinoma of endometrium  - Dr Alvy Bimler managing with chemo - Dr Lisbeth Renshaw has completed radiation    Hypothyroidism - synthroid    Essential hypertension - Losartan being given- HCTZ on hold  Anemia - likely of chronic disease  DVT prophylaxis: Lovenox Code Status: Full code Family Communication: sister Disposition Plan: home tomorrow Consultants:   none Procedures:   none Antimicrobials:  Anti-infectives    Start     Dose/Rate  Route Frequency Ordered Stop   08/30/16 1200  vancomycin (VANCOCIN) IVPB 1000 mg/200 mL premix     1,000 mg 200 mL/hr over 60 Minutes Intravenous Every 12 hours 08/30/16 0849     08/30/16 0600  piperacillin-tazobactam (ZOSYN) IVPB 3.375 g     3.375 g 12.5 mL/hr over 240 Minutes Intravenous Every 8 hours 08/29/16 2342     08/29/16 2245  vancomycin (VANCOCIN) 1,250 mg in sodium chloride 0.9 % 250 mL IVPB  Status:  Discontinued     1,250 mg 166.7 mL/hr over 90 Minutes Intravenous Every 12 hours 08/29/16 2243 08/30/16 0849   08/29/16 2215  piperacillin-tazobactam (ZOSYN) IVPB 3.375 g     3.375 g 100 mL/hr over 30 Minutes Intravenous  Once 08/29/16 2201 08/30/16 0000   08/29/16 2215  vancomycin (VANCOCIN) IVPB 1000 mg/200 mL premix  Status:  Discontinued     1,000 mg 200 mL/hr over 60 Minutes Intravenous  Once 08/29/16 2201 08/29/16 2243   08/29/16 2030  clindamycin (CLEOCIN) IVPB 600 mg     600 mg 100 mL/hr over 30 Minutes Intravenous  Once 08/29/16 2029 08/29/16 2145       Objective: Vitals:   08/29/16 1930 08/29/16 2150 08/29/16 2235 08/30/16 0605  BP: 146/84 149/87 (!) 157/93 132/85  Pulse: 80 85 92 86  Resp:   18 18  Temp:   99 F (37.2 C) 98.4 F (36.9 C)  TempSrc:   Oral Oral  SpO2: 99% 100% 100% 97%  Weight:      Height:  Intake/Output Summary (Last 24 hours) at 08/30/16 1328 Last data filed at 08/30/16 0815  Gross per 24 hour  Intake             1365 ml  Output                1 ml  Net             1364 ml   Filed Weights   08/29/16 1702  Weight: 95.1 kg (209 lb 9 oz)    Examination: General exam: Appears comfortable  HEENT: PERRLA, oral mucosa moist, no sclera icterus or thrush Respiratory system: Clear to auscultation. Respiratory effort normal. Cardiovascular system: S1 & S2 heard, RRR.  No murmurs  Gastrointestinal system: Abdomen soft, non-tender, nondistended. Normal bowel sound. No organomegaly Central nervous system: Alert and oriented. No  focal neurological deficits. Extremities: No cyanosis, clubbing or edema Skin: small patch of erythema over right antecubital area- no ulcers Psychiatry:  Mood & affect appropriate.     Data Reviewed: I have personally reviewed following labs and imaging studies  CBC:  Recent Labs Lab 08/29/16 1810 08/30/16 0527  WBC 6.0 4.3  NEUTROABS 4.3  --   HGB 8.8* 8.6*  HCT 26.2* 25.0*  MCV 84.0 84.5  PLT 154 [id number]   Basic Metabolic Panel:  Recent Labs Lab 08/29/16 1810 08/30/16 0527  NA 136 138  K 3.6 3.8  CL 104 106  CO2 25 25  GLUCOSE 95 89  BUN 15 13  CREATININE 0.75 0.78  CALCIUM 9.2 9.0   GFR: Estimated Creatinine Clearance: 77.1 mL/min (by C-G formula based on SCr of 0.78 mg/dL). Liver Function Tests:  Recent Labs Lab 08/29/16 1810  AST 23  ALT 26  ALKPHOS 154*  BILITOT 0.3  PROT 6.5  ALBUMIN 3.3*   No results for input(s): LIPASE, AMYLASE in the last 168 hours. No results for input(s): AMMONIA in the last 168 hours. Coagulation Profile: No results for input(s): INR, PROTIME in the last 168 hours. Cardiac Enzymes: No results for input(s): CKTOTAL, CKMB, CKMBINDEX, TROPONINI in the last 168 hours. BNP (last 3 results) No results for input(s): PROBNP in the last 8760 hours. HbA1C: No results for input(s): HGBA1C in the last 72 hours. CBG: No results for input(s): GLUCAP in the last 168 hours. Lipid Profile: No results for input(s): CHOL, HDL, LDLCALC, TRIG, CHOLHDL, LDLDIRECT in the last 72 hours. Thyroid Function Tests: No results for input(s): TSH, T4TOTAL, FREET4, T3FREE, THYROIDAB in the last 72 hours. Anemia Panel: No results for input(s): VITAMINB12, FOLATE, FERRITIN, TIBC, IRON, RETICCTPCT in the last 72 hours. Urine analysis:    Component Value Date/Time   COLORURINE YELLOW 01/01/2016 1321   APPEARANCEUR CLOUDY (A) 01/01/2016 1321   LABSPEC 1.020 08/13/2016 1226   PHURINE 6.5 08/13/2016 1226   PHURINE 7.0 01/01/2016 1321   GLUCOSEU  Negative 08/13/2016 1226   HGBUR Moderate 08/13/2016 1226   HGBUR MODERATE (A) 01/01/2016 1321   BILIRUBINUR Negative 08/13/2016 1226   KETONESUR Negative 08/13/2016 1226   KETONESUR NEGATIVE 01/01/2016 1321   PROTEINUR < 30 08/13/2016 1226   PROTEINUR 30 (A) 01/01/2016 1321   UROBILINOGEN 0.2 08/13/2016 1226   NITRITE Negative 08/13/2016 1226   NITRITE NEGATIVE 01/01/2016 1321   LEUKOCYTESUR Negative 08/13/2016 1226   Sepsis Labs: @LABRCNTIP (procalcitonin:4,lacticidven:4) ) Recent Results (from the past 240 hour(s))  Culture, blood (routine x 2)     Status: None (Preliminary result)   Collection Time: 08/29/16  6:08 PM  Result Value  Ref Range Status   Specimen Description BLOOD LEFT ANTECUBITAL  Final   Special Requests BOTTLES DRAWN AEROBIC AND ANAEROBIC 5 CC EA  Final   Culture   Final    NO GROWTH < 24 HOURS Performed at [facility], [place] [address] [place], [place] [address]    Report Status PENDING  Incomplete  Culture, blood (routine x 2)     Status: None (Preliminary result)   Collection Time: 08/29/16  6:24 PM  Result Value Ref Range Status   Specimen Description BLOOD LEFT HAND  Final   Special Requests BOTTLES DRAWN AEROBIC AND ANAEROBIC 3CC  Final   Culture   Final    NO GROWTH < 24 HOURS Performed at [facility], [place] [address], [place], [place] [address]    Report Status PENDING  Incomplete         Radiology Studies: Ct [place] Right W Contrast  Result Date: 08/30/2016 CLINICAL DATA:  Redness to the anterior right elbow EXAM: CT OF THE UPPER RIGHT EXTREMITY WITH CONTRAST TECHNIQUE: Multidetector CT imaging of the upper right extremity was performed according to the standard protocol following intravenous contrast administration. COMPARISON:  None. CONTRAST:  131mL ISOVUE-300 IOPAMIDOL (ISOVUE-300) INJECTION 61%<Contrast>145mL ISOVUE-300 IOPAMIDOL (ISOVUE-300) INJECTION 61% FINDINGS: Bones/Joint/Cartilage No periostitis or fracture.  No large  elbow effusion. Ligaments Suboptimally assessed by CT. Muscles and Tendons Normal muscle bulk. No masses. There is fluid and edema around the muscle bellies of the proximal forearm. Soft tissues Moderate fluid, skin thickening and edema posterior aspect of the elbow. There is mild diffuse subcutaneous edema of the distal arm, elbow, and proximal forearm. There is suspected filling defect within a superficial vein along the radial aspect of the proximal forearm near the antecubital fossa. Vessel appears expanded and there is moderate surrounding inflammation. Suspected thrombus extends into what appears to be cephalic vein at the distal arm. IMPRESSION: 1. Skin thickening and moderate fluid and subcutaneous edema within the soft tissues of the distal upper arm, elbow and proximal forearm which may represent a cellulitis. No definite rim enhancing collections to suggest abscess. 2. Suspect thrombophlebitis/thrombosis of superficial vein of the proximal forearm with possible thrombus in the cephalic vein. Further evaluation with venous ultrasound is recommended. Electronically Signed   By: Donavan Foil M.D.   On: 08/30/2016 01:43      Scheduled Meds: . B-complex with vitamin C  1 tablet Oral Daily  . chlorhexidine  15 mL Mouth/Throat BID  . diclofenac sodium  2 g Topical QID  . gabapentin  600 mg Oral BID  . levothyroxine  88 mcg Oral Daily  . losartan  100 mg Oral Daily  . magnesium oxide  200 mg Oral Daily  . multivitamin with minerals  1 tablet Oral Daily  . piperacillin-tazobactam (ZOSYN)  IV  3.375 g Intravenous Q8H  . potassium chloride  5 mEq Oral Daily  . protein supplement shake  11 oz Oral BID BM  . traZODone  50 mg Oral QHS  . vancomycin  1,000 mg Intravenous Q12H   Continuous Infusions: . sodium chloride 75 mL/hr at 08/29/16 2326     LOS: 0 days    Time spent in minutes: 74    Pierz, MD [facility] Pager: [email or web address] Password [email or web address] 08/30/2016, 1:28 PM

## 2016-08-31 ENCOUNTER — Inpatient Hospital Stay (HOSPITAL_COMMUNITY): Payer: Medicare Other

## 2016-08-31 DIAGNOSIS — I809 Phlebitis and thrombophlebitis of unspecified site: Secondary | ICD-10-CM

## 2016-08-31 DIAGNOSIS — I10 Essential (primary) hypertension: Secondary | ICD-10-CM

## 2016-08-31 DIAGNOSIS — M7989 Other specified soft tissue disorders: Secondary | ICD-10-CM

## 2016-08-31 LAB — CREATININE, SERUM
CREATININE: 0.81 mg/dL (ref 0.44–1.00)
GFR calc Af Amer: >60 mL/min (ref >60)

## 2016-08-31 MED ORDER — SULFAMETHOXAZOLE-TRIMETHOPRIM 800-160 MG PO TABS: 1.0000 | ORAL_TABLET | Freq: Two times a day (BID) | ORAL | 0 refills | Status: DC

## 2016-08-31 MED ORDER — PREMIER PROTEIN SHAKE: 11.0000 [oz_av] | Freq: Two times a day (BID) | ORAL | 0 refills | Status: DC

## 2016-08-31 MED ORDER — DICLOFENAC SODIUM 1 % TD GEL: 2.0000 g | Freq: Four times a day (QID) | TRANSDERMAL | 0 refills | Status: DC

## 2016-08-31 MED ORDER — MORPHINE SULFATE (CONCENTRATE) 10 MG/0.5ML PO SOLN: 10.0000 mg | ORAL | Status: DC | PRN

## 2016-08-31 MED ORDER — SULFAMETHOXAZOLE-TRIMETHOPRIM 400-80 MG PO TABS: 1.0000 | ORAL_TABLET | Freq: Two times a day (BID) | ORAL | 0 refills | Status: DC

## 2016-08-31 MED ORDER — FAMOTIDINE 20 MG PO TABS: 20.0000 mg | ORAL_TABLET | Freq: Two times a day (BID) | ORAL | 0 refills | Status: DC | PRN

## 2016-08-31 MED ORDER — NAPROXEN SODIUM 220 MG PO CAPS: 220.0000 mg | ORAL_CAPSULE | Freq: Two times a day (BID) | ORAL | 0 refills | Status: DC | PRN

## 2016-08-31 MED ORDER — MORPHINE SULFATE (CONCENTRATE) 10 MG/0.5ML PO SOLN: 10.0000 mg | ORAL | 0 refills | Status: DC | PRN

## 2016-08-31 MED ORDER — OXYCODONE HCL 5 MG PO TABS: 5.0000 mg | ORAL_TABLET | ORAL | 0 refills | Status: DC | PRN

## 2016-08-31 NOTE — Discharge Instructions (Signed)
You have a superficial venous thrombosis of the cephalic vein and cellulitis of right arm. Try ice packs, elevation if swollen in addition to Naprosyn and Voltaren gel. You can resume Bactrim for a total of 7 days. Please see your doctor in 5 days to ensure arm is improving. Both Roxanol and Oxycodone are highly sedating. You should also be careful with Xanax and Trazodone which are also highly sedating. Hold Trazodone for now. Naprosyn can cause stomach upset. Your BP is not very high. Please hold off on Losartan/ HCTZ for now while on Bactrim.    Cellulitis, Adult Cellulitis is a skin infection. The infected area is usually red and tender. This condition occurs most often in the arms and lower legs. The infection can travel to the muscles, blood, and underlying tissue and become serious. It is very important to get treated for this condition. What are the causes? Cellulitis is caused by bacteria. The bacteria enter through a break in the skin, such as a cut, burn, insect bite, open sore, or crack. What increases the risk? This condition is more likely to occur in people who:  Have a weak defense system (immune system).  Have open wounds on the skin such as cuts, burns, bites, and scrapes. Bacteria can enter the body through these open wounds.  Are older.  Have diabetes.  Have a type of long-lasting (chronic) liver disease (cirrhosis) or kidney disease.  Use IV drugs. What are the signs or symptoms? Symptoms of this condition include:  Redness, streaking, or spotting on the skin.  Swollen area of the skin.  Tenderness or pain when an area of the skin is touched.  Warm skin.  Fever.  Chills.  Blisters. How is this diagnosed? This condition is diagnosed based on a medical history and physical exam. You may also have tests, including:  Blood tests.  Lab tests.  Imaging tests. How is this treated? Treatment for this condition may include:  Medicines, such as antibiotic  medicines or antihistamines.  Supportive care, such as rest and application of cold or warm cloths (cold or warm compresses) to the skin.  Hospital care, if the condition is severe. The infection usually gets better within 1-2 days of treatment. Follow these instructions at home:  Take over-the-counter and prescription medicines only as told by your health care provider.  If you were prescribed an antibiotic medicine, take it as told by your health care provider. Do not stop taking the antibiotic even if you start to feel better.  Drink enough fluid to keep your urine clear or pale yellow.  Do not touch or rub the infected area.  Raise (elevate) the infected area above the level of your heart while you are sitting or lying down.  Apply warm or cold compresses to the affected area as told by your health care provider.  Keep all follow-up visits as told by your health care provider. This is important. These visits let your health care provider make sure a more serious infection is not developing. Contact a health care provider if:  You have a fever.  Your symptoms do not improve within 1-2 days of starting treatment.  Your bone or joint underneath the infected area becomes painful after the skin has healed.  Your infection returns in the same area or another area.  You notice a swollen bump in the infected area.  You develop new symptoms.  You have a general ill feeling (malaise) with muscle aches and pains. Get help right away  if:  Your symptoms get worse.  You feel very sleepy.  You develop vomiting or diarrhea that persists.  You notice red streaks coming from the infected area.  Your red area gets larger or turns dark in color. This information is not intended to replace advice given to you by your health care provider. Make sure you discuss any questions you have with your health care provider. Document Released: 04/03/2005 Document Revised: 11/02/2015 Document  Reviewed: 05/03/2015 Elsevier Interactive Patient Education  2017 [employer].    Please take all your medications with you for your next visit with your Primary MD. Please request your Primary MD to go over all hospital test results at the follow up. Please ask your Primary MD to get all Hospital records sent to his/her office.  If you experience worsening of your admission symptoms, develop shortness of breath, chest pain, suicidal or homicidal thoughts or a life threatening emergency, you must seek medical attention immediately by calling 911 or calling your MD.  Dennis Bast must read the complete instructions/literature along with all the possible adverse reactions/side effects for all the medicines you take including new medications that have been prescribed to you. Take new medicines after you have completely understood and accpet all the possible adverse reactions/side effects.   Do not drive when taking pain medications or sedatives.    Do not take more than prescribed Pain, Sleep and Anxiety Medications  If you have smoked or chewed Tobacco in the last 2 yrs please stop. Stop any regular alcohol and or recreational drug use.  Wear Seat belts while driving.

## 2016-08-31 NOTE — Progress Notes (Signed)
*  PRELIMINARY RESULTS* Vascular Ultrasound Right upper extremity venous duplex has been completed.  Preliminary findings: The visualized deep veins of the right upper extremity are negative for deep vein thrombosis.  The right cephalic vein is positive for acute superficial vein thrombosis from the mid upper arm to the mid forearm.  Everrett Coombe 08/31/2016, 9:33 AM

## 2016-08-31 NOTE — Discharge Summary (Signed)
Physician Discharge Summary  Michelle Rosario [id number] DOB: April 14, 1946 DOA: 08/29/2016  PCP: Woody Seller, MD  Admit date: 08/29/2016 Discharge date: 08/31/2016  Admitted From: home  Disposition:  home   Recommendations for Outpatient Follow-up:  1. Please f/u right arm in 1 wk 2. Can resume Losartan/ HTCZ if BP high after Bactrim if completed   Discharge Condition:  stable   CODE STATUS:  Full code   Diet recommendation:  Hearth healthy, low sodium Consultations:  none    Discharge Diagnoses:  Principal Problem:   Cellulitis of right upper extremity Active Problems:   Superficial thrombophlebitis of cephalic vein   Recurrent carcinoma of endometrium (HCC)   Hypothyroidism   Essential hypertension    Subjective: Had severe pain in right arm and needed a dose of IV Morphine last night. Has never had pain this severe. Area of redness is burining as well.   Brief Summary: Jisel Wintjen Tuckeris a 71 y.o.femalewith history of endometrial carcinoma on chemotherapy and radiation last radiation was 2 days ago and chemotherapy was 2 weeks ago presents to the ER because of worsening swelling on the right antecubital fossa. Patient noticed the erythema and swelling on 5-6 days ago and was prescribed Bactrim by her oncologist. Despite taking it, the swelling has worsened. Denies any fever or chills. Patient states she has had at least 2 episodes of skin infection over the last 1 month and had taken 2 courses of antibiotics.ER physician had done bedside ultrasound which did not show any definite drainable abscess.   Hospital Course:  Principal Problem:   Cellulitis of upper extremity - right antecubital and surrounding region about 5-6 in in length - started on Vanc and Zosyn as she is immunosuppressed from chemo- has had near resolution of erythema- remaining erythema is directly over antecubital area and is small - see below in regards to thrombophlebitis  - no abscess noted  on bedside ultrasound and CT - can resume Bactrim DS which has better activity than Doxy against soft tissue infections- BP has not been elevated and Losartan HCTZ can be held while on Bactrim to prevent renal failure   Superficial thrombophlebitis - of cephalic vein - Voltaren gel, Naprosyn, ice packs, elevation - obtained Venous to duplex -  no DVT noted   Active Problems:   Recurrent carcinoma of endometrium  - Dr Alvy Bimler managing with chemo - Dr Lisbeth Renshaw has completed radiation    Hypothyroidism - synthroid    Essential hypertension - hold Losartan  HCTZ for now  Anemia - likely of chronic disease vs chemo- oncology to follow  Anxiety/ Insomnia - hold Trazodone and instructed to use Xanax carefully while on narcotics for pain  Discharge Instructions  Discharge Instructions    Diet - low sodium heart healthy    Complete by:  As directed    Increase activity slowly    Complete by:  As directed    Increase activity slowly    Complete by:  As directed      Allergies as of 08/31/2016      Reactions   Latex Itching      Medication List    STOP taking these medications   HYDROCODONE-ACETAMINOPHEN PO- does not have this at home   losartan-hydrochlorothiazide 100-12.5 MG tablet Commonly known as:  HYZAAR   traMADol 50 MG tablet Commonly known as:  ULTRAM   traZODone 50 MG tablet Commonly known as:  DESYREL     TAKE these medications   ALPRAZolam 1 MG  tablet Commonly known as:  XANAX Take 1/2 to one tablet at bedtime as needed for insomnia and restless legs.   CALTRATE COLON HEALTH PO Take 1 tablet by mouth daily.   chlorhexidine 0.12 % solution Commonly known as:  PERIDEX Use as directed 15 mLs in the mouth or throat 2 (two) times daily.   clonazePAM 0.5 MG tablet Commonly known as:  KLONOPIN Take 0.5 mg by mouth daily as needed (restless legs).   dexamethasone 4 MG tablet Commonly known as:  DECADRON Take 5 tablets with food 12 hrs and 6 hrs  prior to Taxol chemotherapy.   diclofenac sodium 1 % Gel Commonly known as:  VOLTAREN Apply 2 g topically 4 (four) times daily.   famotidine 20 MG tablet Commonly known as:  PEPCID Take 1 tablet (20 mg total) by mouth 2 (two) times daily as needed for heartburn or indigestion.   fluticasone 50 MCG/ACT nasal spray Commonly known as:  FLONASE Place 1 spray into both nostrils 2 (two) times daily as needed.   gabapentin 600 MG tablet Commonly known as:  NEURONTIN Take 1 tablet (600 mg total) by mouth 2 (two) times daily.   levothyroxine 88 MCG tablet Commonly known as:  SYNTHROID, LEVOTHROID Take 88 mcg by mouth daily.   loperamide 2 MG capsule Commonly known as:  IMODIUM Take by mouth as needed for diarrhea or loose stools.   LORazepam 1 MG tablet Commonly known as:  ATIVAN Place 1 tablet (1 mg total) under the tongue every 8 (eight) hours as needed (nausea).   magnesium 30 MG tablet Take 30 mg by mouth daily.   Melatonin 3 MG Tbdp Take 3 mg by mouth at bedtime.   morphine CONCENTRATE 10 MG/0.5ML Soln concentrated solution Take 0.5 mLs (10 mg total) by mouth every 2 (two) hours as needed for severe pain.   multivitamin with minerals Tabs tablet Take 1 tablet by mouth daily.   Naproxen Sodium 220 MG Caps Take 1 capsule (220 mg total) by mouth 2 (two) times daily as needed (pain).   nystatin powder Commonly known as:  MYCOSTATIN/NYSTOP Apply to affected area  twice a day as directed.   ondansetron 8 MG tablet Commonly known as:  ZOFRAN Take 1 tablet (8 mg total) by mouth every 8 (eight) hours as needed for nausea.   oxyCODONE 5 MG immediate release tablet Commonly known as:  Oxy IR/ROXICODONE Take 1 tablet (5 mg total) by mouth every 4 (four) hours as needed for severe pain.   POTASSIMIN 75 MG Tabs Generic drug:  Potassium Take 75 mg by mouth daily.   prochlorperazine 10 MG tablet Commonly known as:  COMPAZINE Take 1 tablet (10 mg total) by mouth every 6  (six) hours as needed for nausea.   protein supplement shake Liqd Commonly known as:  PREMIER PROTEIN Take 325 mLs (11 oz total) by mouth 2 (two) times daily between meals.   sulfamethoxazole-trimethoprim 400-80 MG tablet Commonly known as:  BACTRIM Take 1 tablet by mouth 2 (two) times daily. What changed:  You were already taking a medication with the same name, and this prescription was added. Make sure you understand how and when to take each.   sulfamethoxazole-trimethoprim 800-160 MG tablet Commonly known as:  BACTRIM DS,SEPTRA DS Take 1 tablet by mouth 2 (two) times daily. What changed:  Another medication with the same name was added. Make sure you understand how and when to take each.   Vitamin B Complex Tabs Take 1 tablet by  mouth daily.       Allergies  Allergen Reactions  . Latex Itching     Procedures/Studies: Venous Duplex R arm  Ct [place] Right W Contrast  Result Date: 08/30/2016 CLINICAL DATA:  Redness to the anterior right elbow EXAM: CT OF THE UPPER RIGHT EXTREMITY WITH CONTRAST TECHNIQUE: Multidetector CT imaging of the upper right extremity was performed according to the standard protocol following intravenous contrast administration. COMPARISON:  None. CONTRAST:  127mL ISOVUE-300 IOPAMIDOL (ISOVUE-300) INJECTION 61%<Contrast>166mL ISOVUE-300 IOPAMIDOL (ISOVUE-300) INJECTION 61% FINDINGS: Bones/Joint/Cartilage No periostitis or fracture.  No large elbow effusion. Ligaments Suboptimally assessed by CT. Muscles and Tendons Normal muscle bulk. No masses. There is fluid and edema around the muscle bellies of the proximal forearm. Soft tissues Moderate fluid, skin thickening and edema posterior aspect of the elbow. There is mild diffuse subcutaneous edema of the distal arm, elbow, and proximal forearm. There is suspected filling defect within a superficial vein along the radial aspect of the proximal forearm near the antecubital fossa. Vessel appears expanded and there  is moderate surrounding inflammation. Suspected thrombus extends into what appears to be cephalic vein at the distal arm. IMPRESSION: 1. Skin thickening and moderate fluid and subcutaneous edema within the soft tissues of the distal upper arm, elbow and proximal forearm which may represent a cellulitis. No definite rim enhancing collections to suggest abscess. 2. Suspect thrombophlebitis/thrombosis of superficial vein of the proximal forearm with possible thrombus in the cephalic vein. Further evaluation with venous ultrasound is recommended. Electronically Signed   By: Donavan Foil M.D.   On: 08/30/2016 01:43       Discharge Exam: Vitals:   08/30/16 2048 08/31/16 0510  BP: 121/74 122/80  Pulse: 81 85  Resp: 18 18  Temp: 98.4 F (36.9 C) 98.2 F (36.8 C)   Vitals:   08/30/16 0605 08/30/16 1445 08/30/16 2048 08/31/16 0510  BP: 132/85 (!) 143/83 121/74 122/80  Pulse: 86 86 81 85  Resp: 18 18 18 18   Temp: 98.4 F (36.9 C) 98.3 F (36.8 C) 98.4 F (36.9 C) 98.2 F (36.8 C)  TempSrc: Oral Oral Oral Oral  SpO2: 97% 100% 97% 99%  Weight:      Height:        General: Pt is alert, awake, not in acute distress Cardiovascular: RRR, S1/S2 +, no rubs, no gallops Respiratory: CTA bilaterally, no wheezing, no rhonchi Abdominal: Soft, NT, ND, bowel sounds + Extremities: no edema, no cyanosis    The results of significant diagnostics from this hospitalization (including imaging, microbiology, ancillary and laboratory) are listed below for reference.     Microbiology: Recent Results (from the past 240 hour(s))  Culture, blood (routine x 2)     Status: None (Preliminary result)   Collection Time: 08/29/16  6:08 PM  Result Value Ref Range Status   Specimen Description BLOOD LEFT ANTECUBITAL  Final   Special Requests BOTTLES DRAWN AEROBIC AND ANAEROBIC 5 CC EA  Final   Culture   Final    NO GROWTH < 24 HOURS Performed at [facility], [place] [address] [place], [place] [address]     Report Status PENDING  Incomplete  Culture, blood (routine x 2)     Status: None (Preliminary result)   Collection Time: 08/29/16  6:24 PM  Result Value Ref Range Status   Specimen Description BLOOD LEFT HAND  Final   Special Requests BOTTLES DRAWN AEROBIC AND ANAEROBIC 3CC  Final   Culture   Final  NO GROWTH < 24 HOURS Performed at [facility] [address], [place], [place] [address]    Report Status PENDING  Incomplete  Nasal culture     Status: None (Preliminary result)   Collection Time: 08/30/16  1:31 PM  Result Value Ref Range Status   Specimen Description Naso  Final   Special Requests NONE  Final   Culture   Final    CULTURE REINCUBATED FOR BETTER GROWTH Performed at [facility], [place] [address], [place], [place] [address]    Report Status PENDING  Incomplete     Labs: BNP (last 3 results) No results for input(s): BNP in the last 8760 hours. Basic Metabolic Panel:  Recent Labs Lab 08/29/16 1810 08/30/16 0527 08/31/16 0539  NA 136 138  --   K 3.6 3.8  --   CL 104 106  --   CO2 25 25  --   GLUCOSE 95 89  --   BUN 15 13  --   CREATININE 0.75 0.78 0.81  CALCIUM 9.2 9.0  --    Liver Function Tests:  Recent Labs Lab 08/29/16 1810  AST 23  ALT 26  ALKPHOS 154*  BILITOT 0.3  PROT 6.5  ALBUMIN 3.3*   No results for input(s): LIPASE, AMYLASE in the last 168 hours. No results for input(s): AMMONIA in the last 168 hours. CBC:  Recent Labs Lab 08/29/16 1810 08/30/16 0527  WBC 6.0 4.3  NEUTROABS 4.3  --   HGB 8.8* 8.6*  HCT 26.2* 25.0*  MCV 84.0 84.5  PLT 154 157   Cardiac Enzymes: No results for input(s): CKTOTAL, CKMB, CKMBINDEX, TROPONINI in the last 168 hours. BNP: Invalid input(s): POCBNP CBG: No results for input(s): GLUCAP in the last 168 hours. D-Dimer No results for input(s): DDIMER in the last 72 hours. Hgb A1c No results for input(s): HGBA1C in the last 72 hours. Lipid Profile No results for input(s): CHOL, HDL,  LDLCALC, TRIG, CHOLHDL, LDLDIRECT in the last 72 hours. Thyroid function studies No results for input(s): TSH, T4TOTAL, T3FREE, THYROIDAB in the last 72 hours.  Invalid input(s): FREET3 Anemia work up No results for input(s): VITAMINB12, FOLATE, FERRITIN, TIBC, IRON, RETICCTPCT in the last 72 hours. Urinalysis    Component Value Date/Time   COLORURINE YELLOW 01/01/2016 1321   APPEARANCEUR CLOUDY (A) 01/01/2016 1321   LABSPEC 1.020 08/13/2016 1226   PHURINE 6.5 08/13/2016 1226   PHURINE 7.0 01/01/2016 1321   GLUCOSEU Negative 08/13/2016 1226   HGBUR Moderate 08/13/2016 1226   HGBUR MODERATE (A) 01/01/2016 1321   BILIRUBINUR Negative 08/13/2016 1226   KETONESUR Negative 08/13/2016 1226   KETONESUR NEGATIVE 01/01/2016 1321   PROTEINUR < 30 08/13/2016 1226   PROTEINUR 30 (A) 01/01/2016 1321   UROBILINOGEN 0.2 08/13/2016 1226   NITRITE Negative 08/13/2016 1226   NITRITE NEGATIVE 01/01/2016 1321   LEUKOCYTESUR Negative 08/13/2016 1226   Sepsis Labs Invalid input(s): PROCALCITONIN,  WBC,  LACTICIDVEN Microbiology Recent Results (from the past 240 hour(s))  Culture, blood (routine x 2)     Status: None (Preliminary result)   Collection Time: 08/29/16  6:08 PM  Result Value Ref Range Status   Specimen Description BLOOD LEFT ANTECUBITAL  Final   Special Requests BOTTLES DRAWN AEROBIC AND ANAEROBIC 5 CC EA  Final   Culture   Final    NO GROWTH < 24 HOURS Performed at [facility], [place] [address], [place], [place] [address]    Report Status PENDING  Incomplete  Culture, blood (routine x 2)     Status: None (Preliminary result)   Collection Time: 08/29/16  6:24 PM  Result Value Ref Range Status   Specimen Description BLOOD LEFT HAND  Final   Special Requests BOTTLES DRAWN AEROBIC AND ANAEROBIC 3CC  Final   Culture   Final    NO GROWTH < 24 HOURS Performed at [facility], [place] [address], [place], [place] [address]    Report Status PENDING  Incomplete  Nasal culture      Status: None (Preliminary result)   Collection Time: 08/30/16  1:31 PM  Result Value Ref Range Status   Specimen Description Naso  Final   Special Requests NONE  Final   Culture   Final    CULTURE REINCUBATED FOR BETTER GROWTH Performed at [facility], [place] [address], [place], [place] [address]    Report Status PENDING  Incomplete     Time coordinating discharge: Over 30 minutes  SIGNED:   Debbe Odea, MD  [facility] 08/31/2016, 10:20 AM Pager   If 7PM-7AM, please contact night-coverage [email or web address] Password [email or web address]

## 2016-09-01 LAB — NASAL CULTURE (N/P): CULTURE: NOT DETECTED

## 2016-09-03 LAB — CULTURE, BLOOD (ROUTINE X 2)
CULTURE: NO GROWTH
Culture: NO GROWTH

## 2016-09-04 ENCOUNTER — Ambulatory Visit
Admission: RE | Admit: 2016-09-04 | Discharge: 2016-09-04 | Disposition: A | Discharge status: Home / Self Care | Payer: Medicare Other | Source: Ambulatory Visit | Attending: Radiation Oncology | Admitting: Radiation Oncology

## 2016-09-04 DIAGNOSIS — C7982 Secondary malignant neoplasm of genital organs: Secondary | ICD-10-CM | POA: Diagnosis not present

## 2016-09-04 DIAGNOSIS — C541 Malignant neoplasm of endometrium: Secondary | ICD-10-CM | POA: Diagnosis present

## 2016-09-04 DIAGNOSIS — Z510 Encounter for antineoplastic radiation therapy: Secondary | ICD-10-CM | POA: Diagnosis present

## 2016-09-04 DIAGNOSIS — C7989 Secondary malignant neoplasm of other specified sites: Secondary | ICD-10-CM | POA: Diagnosis not present

## 2016-09-04 NOTE — Progress Notes (Signed)
  Radiation Oncology         [phone] ________________________________  Name: Michelle Rosario MRN: [id number]  Date: 09/04/2016  DOB: [birth year]  CC: Woody Seller, MD  [name], MD  HDR BRACHYTHERAPY NOTE  DIAGNOSIS: FIGO stage IA (pT1a, pN0) grade 2 endometrial adenocarcinoma, now with vaginal and pelvic recurrence   Simple treatment device note: Patient had construction of her custom vaginal cylinder. She will be treated with a 3 cm diameter segmented cylinder. This conforms to her anatomy without undue discomfort.  Vaginal brachytherapy procedure node: The patient was brought to the [place] suite. Identity was confirmed. All relevant records and images related to the planned course of therapy were reviewed. The patient freely provided informed written consent to proceed with treatment after reviewing the details related to the planned course of therapy. The consent form was witnessed and verified by the simulation staff. Then, the patient was set-up in a stable reproducible supine position for radiation therapy. Pelvic exam revealed the vaginal cuff to be intact The patient's custom vaginal cylinder was placed in the proximal vagina. This was affixed to the CT/MR stabilization plate to prevent slippage. Patient tolerated the placement well.  Verification simulation note:  A fiducial marker was placed within the vaginal cylinder. An AP and lateral film was then obtained through the pelvis area. This documented accurate position of the vaginal cylinder for treatment.  HDR BRACHYTHERAPY TREATMENT  The remote afterloading device was affixed to the vaginal cylinder by catheter. Patient then proceeded to undergo her fourth high-dose-rate treatment directed at the proximal vagina. The patient was prescribed a dose of 6 gray to be delivered to the mucosal surface. Treatment length was 4 cm. Patient was treated with 1 channel using 9 dwell positions. Treatment time was 316.9 seconds. Iridium  192 was the high-dose-rate source for treatment. The patient tolerated the treatment well. After completion of her therapy, a radiation survey was performed documenting return of the iridium source into the [employer] safe.   PLAN: The patient completed HDR brachytherapy and will return in a month for a follow up. ________________________________  Blair Promise, PhD, MD   This document serves as a record of services personally performed by Gery Pray, MD. It was created on his behalf by Darcus Austin, a trained medical scribe. The creation of this record is based on the scribe's personal observations and the provider's statements to them. This document has been checked and approved by the attending provider.

## 2016-09-05 ENCOUNTER — Encounter: Payer: Self-pay | Admitting: Radiation Oncology

## 2016-09-05 ENCOUNTER — Other Ambulatory Visit: Payer: Medicare Other

## 2016-09-05 ENCOUNTER — Ambulatory Visit: Payer: Medicare Other

## 2016-09-05 NOTE — Progress Notes (Signed)
  Radiation Oncology         [phone] ________________________________  Name: Michelle Rosario MRN: [id number]  Date: 09/05/2016  DOB: 1946/05/05  End of Treatment Note  Diagnosis:   FIGO stage IA (pT1a, pN0) grade 2 endometrial adenocarcinoma, now with vaginal and pelvic recurrence  Indication for treatment:  Curative       Radiation treatment dates:   IMRT : 06/24/16 - 08/01/16 HDR : 08/13/16, 08/20/16, 08/27/16, 09/04/16  Site/dose:   Pelvis treated to 55 Gy in 25 fractions (simultaneous integrated boost technique) Vaginal Cuff treated to 24 Gy in 4 fractions  Beams/energy:   Pelvis : IMRT  //  6X Vaginal Cuff : HDR Ir-192 Vaginal  //  Iridium HDR, The patient  received 24 Gy in 4 fractions. Patient was treated with iridium 192 as the high-dose-rate source.  treatment with a 3 cm diameter cylinder with a prescription length of 4 cm. 6 Pearline Cables will be delivered to the mucosal surface  Narrative: The patient tolerated radiation treatment relatively well. She denied hematuria or vaginal bleeding. The patient did report dysuria during treatment, and had a urinalysis/culture performed. Results showed no evidence of bladder infection. She reported diarrhea, which she managed with Imodium as needed. She reported fatigue throughout treatment.  Plan: The patient has completed radiation treatment. The patient will return to radiation oncology clinic for routine followup in one month. I advised them to call or return sooner if they have any questions or concerns related to their recovery or treatment.  -----------------------------------  Blair Promise, PhD, MD  This document serves as a record of services personally performed by Gery Pray, MD. It was created on his behalf by Maryla Morrow, a trained medical scribe. The creation of this record is based on the scribe's personal observations and the provider's statements to them. This document has been checked and approved by the attending  provider.

## 2016-09-09 ENCOUNTER — Ambulatory Visit (HOSPITAL_BASED_OUTPATIENT_CLINIC_OR_DEPARTMENT_OTHER): Payer: Medicare Other | Admitting: Hematology and Oncology

## 2016-09-09 ENCOUNTER — Telehealth: Payer: Self-pay | Admitting: Hematology and Oncology

## 2016-09-09 ENCOUNTER — Other Ambulatory Visit (HOSPITAL_BASED_OUTPATIENT_CLINIC_OR_DEPARTMENT_OTHER): Payer: Medicare Other

## 2016-09-09 ENCOUNTER — Ambulatory Visit (HOSPITAL_BASED_OUTPATIENT_CLINIC_OR_DEPARTMENT_OTHER): Payer: Medicare Other

## 2016-09-09 VITALS — BP 149/82 | HR 101 | Temp 98.3°F | Resp 20 | Wt 213.1 lb

## 2016-09-09 DIAGNOSIS — Z5111 Encounter for antineoplastic chemotherapy: Secondary | ICD-10-CM

## 2016-09-09 DIAGNOSIS — T801XXA Vascular complications following infusion, transfusion and therapeutic injection, initial encounter: Secondary | ICD-10-CM

## 2016-09-09 DIAGNOSIS — C541 Malignant neoplasm of endometrium: Secondary | ICD-10-CM

## 2016-09-09 DIAGNOSIS — D61818 Other pancytopenia: Secondary | ICD-10-CM

## 2016-09-09 LAB — COMPREHENSIVE METABOLIC PANEL
ALT: 19 U/L (ref 0–55)
ANION GAP: 11 meq/L (ref 3–11)
AST: 16 U/L (ref 5–34)
Albumin: 3.7 g/dL (ref 3.5–5.0)
Alkaline Phosphatase: 176 U/L — ABNORMAL HIGH (ref 40–150)
BUN: 24.3 mg/dL (ref 7.0–26.0)
CALCIUM: 10 mg/dL (ref 8.4–10.4)
CHLORIDE: 107 meq/L (ref 98–109)
CO2: 20 meq/L — AB (ref 22–29)
Creatinine: 0.9 mg/dL (ref 0.6–1.1)
EGFR: 66 mL/min/{1.73_m2} — ABNORMAL LOW (ref >90)
Glucose: 185 mg/dl — ABNORMAL HIGH (ref 70–140)
POTASSIUM: 4.4 meq/L (ref 3.5–5.1)
Sodium: 137 mEq/L (ref 136–145)
Total Bilirubin: 0.23 mg/dL (ref 0.20–1.20)
Total Protein: 7.8 g/dL (ref 6.4–8.3)

## 2016-09-09 LAB — CBC WITH DIFFERENTIAL/PLATELET
BASO%: 0.3 % (ref 0.0–2.0)
BASOS ABS: 0 10*3/uL (ref 0.0–0.1)
EOS%: 0.2 % (ref 0.0–7.0)
Eosinophils Absolute: 0 10*3/uL (ref 0.0–0.5)
HEMATOCRIT: 29.9 % — AB (ref 34.8–46.6)
HGB: 10.3 g/dL — ABNORMAL LOW (ref 11.6–15.9)
LYMPH#: 1 10*3/uL (ref 0.9–3.3)
LYMPH%: 15.7 % (ref 14.0–49.7)
MCH: 29.5 pg (ref 25.1–34.0)
MCHC: 34.4 g/dL (ref 31.5–36.0)
MCV: 85.7 fL (ref 79.5–101.0)
MONO#: 0.1 10*3/uL (ref 0.1–0.9)
MONO%: 1.3 % (ref 0.0–14.0)
NEUT#: 5 10*3/uL (ref 1.5–6.5)
NEUT%: 82.5 % — ABNORMAL HIGH (ref 38.4–76.8)
PLATELETS: 385 10*3/uL (ref 145–400)
RBC: 3.48 10*6/uL — AB (ref 3.70–5.45)
RDW: 17.7 % — ABNORMAL HIGH (ref 11.2–14.5)
WBC: 6.1 10*3/uL (ref 3.9–10.3)

## 2016-09-09 MED ORDER — DIPHENHYDRAMINE HCL 50 MG/ML IJ SOLN
INTRAMUSCULAR | Status: AC
  Filled 2016-09-09: qty 1

## 2016-09-09 MED ORDER — FOSAPREPITANT DIMEGLUMINE INJECTION 150 MG
Freq: Once | INTRAVENOUS | Status: AC
  Administered 2016-09-09: 12:00:00 via INTRAVENOUS
  Filled 2016-09-09: qty 5

## 2016-09-09 MED ORDER — FAMOTIDINE IN NACL 20-0.9 MG/50ML-% IV SOLN
20.0000 mg | Freq: Once | INTRAVENOUS | Status: AC
  Administered 2016-09-09: 20 mg via INTRAVENOUS

## 2016-09-09 MED ORDER — FAMOTIDINE IN NACL 20-0.9 MG/50ML-% IV SOLN
INTRAVENOUS | Status: AC
  Filled 2016-09-09: qty 50

## 2016-09-09 MED ORDER — PALONOSETRON HCL INJECTION 0.25 MG/5ML
INTRAVENOUS | Status: AC
  Filled 2016-09-09: qty 5

## 2016-09-09 MED ORDER — PALONOSETRON HCL INJECTION 0.25 MG/5ML
0.2500 mg | Freq: Once | INTRAVENOUS | Status: AC
  Administered 2016-09-09: 0.25 mg via INTRAVENOUS

## 2016-09-09 MED ORDER — PACLITAXEL CHEMO INJECTION 300 MG/50ML
175.0000 mg/m2 | Freq: Once | INTRAVENOUS | Status: AC
  Administered 2016-09-09: 366 mg via INTRAVENOUS
  Filled 2016-09-09: qty 61

## 2016-09-09 MED ORDER — SODIUM CHLORIDE 0.9 % IV SOLN
Freq: Once | INTRAVENOUS | Status: AC
  Administered 2016-09-09: 11:00:00 via INTRAVENOUS

## 2016-09-09 MED ORDER — SODIUM CHLORIDE 0.9 % IV SOLN
640.8000 mg | Freq: Once | INTRAVENOUS | Status: AC
  Administered 2016-09-09: 640 mg via INTRAVENOUS
  Filled 2016-09-09: qty 64

## 2016-09-09 MED ORDER — DIPHENHYDRAMINE HCL 50 MG/ML IJ SOLN
50.0000 mg | Freq: Once | INTRAMUSCULAR | Status: AC
  Administered 2016-09-09: 50 mg via INTRAVENOUS

## 2016-09-09 MED ORDER — PEGFILGRASTIM 6 MG/0.6ML ~~LOC~~ PSKT
6.0000 mg | PREFILLED_SYRINGE | Freq: Once | SUBCUTANEOUS | Status: AC
  Administered 2016-09-09: 6 mg via SUBCUTANEOUS
  Filled 2016-09-09: qty 0.6

## 2016-09-09 NOTE — Assessment & Plan Note (Signed)
She had recent cellulitis and post phlebitis syndrome. I recommend she gets blood draw from her right arm and to get chemotherapy through the left arm. She does not need to continue taking Bactrim

## 2016-09-09 NOTE — Assessment & Plan Note (Signed)
Her blood count has improved significantly since the last time she was seen here. I recommend she stop taking Bactrim. She will continue G-CSF support with chemotherapy as prescribed.  From the anemia standpoint, she is currently not symptomatic.

## 2016-09-09 NOTE — Progress Notes (Signed)
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 Lymph node, sentinel, biopsy, right obturator ONE BENIGN LYMPH NODE (0/1) 3. Lymph nodes, regional resection, left pelvic FOUR BENIGN LYMPH NODES (0/4)      01/02/2016 Surgery    Dr. Denman George performed robotic-assisted laparoscopic total hysterectomy with bilateral salpingoophorectomy, sentinel lymph node biopsy, lymphadenectomy        05/15/2016 Pathology Results    Vagina, biopsy, mid - ADENOCARCINOMA, SEE COMMENT. Microscopic Comment The morphology along with the patient's history are consistent with recurrent endometrioid adenocarcinoma. The carcinoma has a similar appearance to the primary ([id number]).      05/20/2016 Imaging    Ct scan abdomen showed solid 2.5 cm peritoneal mass in the mid to anterior left pelvis, suspicious for peritoneal metastasis. 2. Small expansile low-attenuation filling defect in the left external iliac vein, cannot exclude a small deep venous thrombus. Consider correlation with left lower extremity venous Doppler scan. 3. Small simple fluid density structure in the left pelvic sidewall abutting the left external iliac vessels, favor a small postoperative seroma. 4. No ascites. 5. No lymphadenopathy.  No metastatic disease in the chest. 6. Aortic atherosclerosis.      06/12/2016 PET scan    Intensely hypermetabolic 2.1 cm central pelvic peritoneal mass just to the left of midline, consistent with peritoneal metastatic recurrence.  No ascites. 2. No additional hypermetabolic sites of metastatic disease. 3. Diffuse thyroid hypermetabolism without discrete thyroid nodule, favoring thyroiditis. Recommend correlation with serum thyroid function tests.      06/24/2016 - 07/22/2016 Chemotherapy    The patient had weekly cisplatin. She has missed several doses due to infection and pancytopenia      06/24/2016 - 08/01/2016 Radiation Therapy    She completed concurrent radiation therapy Radiation treatment dates:   IMRT : 06/24/16 - 08/01/16 HDR : 08/13/16, 08/20/16, 08/27/16, 09/04/16  Site/dose:   Pelvis treated to 55 Gy in 25 fractions (simultaneous integrated boost technique) Vaginal Cuff treated to 24 Gy in 4 fractions       INTERVAL HISTORY: Please see below for problem oriented charting. She is seen prior to cycle 2 of chemotherapy. She had recent admission to the hospital for cellulitis, improving. She is to have some discoloration in the antecubital fossa but the cellulitis had resolved. She denies recent fever or chills. She had very mild peripheral neuropathy, stable.  REVIEW OF SYSTEMS:   Constitutional: Denies fevers, chills or abnormal weight loss Eyes: Denies blurriness of vision Ears, nose, mouth, throat, and face: Denies mucositis or sore throat Respiratory: Denies cough, dyspnea or wheezes Cardiovascular: Denies palpitation, chest discomfort or lower extremity swelling Gastrointestinal:  Denies nausea, heartburn or change in bowel habits Lymphatics: Denies new lymphadenopathy or easy bruising Neurological:Denies numbness, tingling or new weaknesses Behavioral/Psych: Mood is stable, no new changes  All other systems were reviewed with the patient and are negative.  I have reviewed the past medical history, past surgical history, social history and family history with the patient and they are unchanged from previous note.  ALLERGIES:  is allergic to latex.  MEDICATIONS:  Current Outpatient  Prescriptions  Medication Sig Dispense Refill  . B Complex Vitamins (VITAMIN B COMPLEX) TABS Take 1 tablet by mouth daily.     . Calcium Carbonate-Vitamin D (CALTRATE COLON HEALTH PO) Take 1 tablet by mouth daily.    . chlorhexidine (PERIDEX) 0.12 % solution Use as directed 15 mLs in the mouth or throat 2 (two) times daily. 120 mL 0  . clonazePAM (KLONOPIN) 0.5 MG tablet Take 0.5 mg by mouth daily as needed (restless legs).    Marland Kitchen dexamethasone (DECADRON) 4 MG tablet Take 5 tablets with food 12 hrs and 6 hrs prior to Taxol chemotherapy. 10 tablet 6  . fluticasone (FLONASE) 50 MCG/ACT nasal spray Place 1 spray into both nostrils 2 (two) times daily as needed.  1  . gabapentin (NEURONTIN) 600 MG tablet Take 1 tablet (600 mg total) by mouth 2 (two) times daily. 60 tablet 0  . levothyroxine (SYNTHROID, LEVOTHROID) 88 MCG tablet Take 88 mcg by mouth daily.  3  . magnesium 30 MG tablet Take 30 mg by mouth daily.     . Melatonin 3 MG TBDP Take 3 mg by mouth at bedtime.     . Multiple Vitamin (MULTIVITAMIN WITH MINERALS) TABS tablet Take 1 tablet by mouth daily.    . Naproxen Sodium 220 MG CAPS Take 1 capsule (220 mg total) by mouth 2 (two) times daily as needed (pain). 60 each 0  . Potassium (POTASSIMIN) 75 MG TABS Take 75 mg by mouth daily.    . protein supplement shake (PREMIER PROTEIN) LIQD Take 325 mLs (11 oz total) by mouth 2 (two) times daily between meals. 14 Can 0  . traMADol (ULTRAM) 50 MG tablet     . traZODone (DESYREL) 50 MG tablet     . ALPRAZolam (XANAX) 1 MG tablet Take 1/2 to one tablet at bedtime as needed for insomnia and restless legs.    . famotidine (PEPCID) 20 MG tablet Take 1 tablet (20 mg total) by mouth 2 (two) times daily as needed for heartburn or indigestion. (Patient not taking: Reported on 09/09/2016) 30 tablet 0  . loperamide (IMODIUM) 2 MG capsule Take by mouth as needed for diarrhea or loose stools.    Marland Kitchen LORazepam (ATIVAN) 1 MG tablet Place 1 tablet (1 mg total) under the  tongue every 8 (eight) hours as needed (nausea). (Patient not taking: Reported on 09/09/2016) 30 tablet 0  . Morphine Sulfate (MORPHINE CONCENTRATE) 10 MG/0.5ML SOLN concentrated solution Take 0.5 mLs (10 mg total) by mouth every 2 (two) hours as needed for severe pain. (Patient not taking: Reported on 09/09/2016) 15 mL 0  . nystatin (MYCOSTATIN/NYSTOP) powder Apply to affected area  twice a day as directed. (Patient not taking: Reported on 09/09/2016) 15 g 1  . ondansetron (ZOFRAN) 8 MG tablet Take 1 tablet (8 mg total) by mouth every 8 (eight) hours as needed for nausea. (Patient not taking: Reported on 09/09/2016) 60 tablet 1  . oxyCODONE (OXY IR/ROXICODONE) 5 MG immediate release tablet Take 1 tablet (5 mg total) by mouth every 4 (four) hours as needed for severe pain. (Patient not taking: Reported on 09/09/2016) 10 tablet 0  . prochlorperazine (COMPAZINE) 10 MG tablet Take 1 tablet (10 mg total) by mouth every 6 (six) hours as needed for nausea. (Patient not taking: Reported on 09/09/2016) 20 tablet 0  . sulfamethoxazole-trimethoprim (BACTRIM DS,SEPTRA DS) 800-160 MG tablet Take 1 tablet by mouth 2 (two) times daily. (Patient not taking: Reported on 09/09/2016) 14 tablet  0   No current facility-administered medications for this visit.    Facility-Administered Medications Ordered in Other Visits  Medication Dose Route Frequency Provider Last Rate Last Dose  . CARBOplatin (PARAPLATIN) 640 mg in sodium chloride 0.9 % 250 mL chemo infusion  640 mg Intravenous Once Heath Lark, MD      . PACLitaxel (TAXOL) 366 mg in dextrose 5 % 500 mL chemo infusion (> 80m/m2)  175 mg/m2 (Treatment Plan Recorded) Intravenous Once NHeath Lark MD 187 mL/hr at 09/09/16 1219 366 mg at 09/09/16 1219  . pegfilgrastim (NEULASTA ONPRO KIT) injection 6 mg  6 mg Subcutaneous Once NHeath Lark MD        PHYSICAL EXAMINATION: ECOG PERFORMANCE STATUS: 1 - Symptomatic but completely ambulatory  Vitals:   09/09/16 1007  BP: (!) 149/82   Pulse: (!) 101  Resp: 20  Temp: 98.3 F (36.8 C)   Filed Weights   09/09/16 1007  Weight: 213 lb 1.6 oz (96.7 kg)    GENERAL:alert, no distress and comfortable SKIN: She has mild discoloration in the antecubital vein.  No signs of persistent cellulitis EYES: normal, Conjunctiva are pink and non-injected, sclera clear OROPHARYNX:no exudate, no erythema and lips, buccal mucosa, and tongue normal  NECK: supple, thyroid normal size, non-tender, without nodularity LYMPH:  no palpable lymphadenopathy in the cervical, axillary or inguinal LUNGS: clear to auscultation and percussion with normal breathing effort HEART: regular rate & rhythm and no murmurs and no lower extremity edema ABDOMEN:abdomen soft, non-tender and normal bowel sounds Musculoskeletal:no cyanosis of digits and no clubbing  NEURO: alert & oriented x 3 with fluent speech, no focal motor/sensory deficits  LABORATORY DATA:  I have reviewed the data as listed    Component Value Date/Time   NA 137 09/09/2016 0949   K 4.4 09/09/2016 0949   CL 106 08/30/2016 0527   CO2 20 (L) 09/09/2016 0949   GLUCOSE 185 (H) 09/09/2016 0949   BUN 24.3 09/09/2016 0949   CREATININE 0.9 09/09/2016 0949   CALCIUM 10.0 09/09/2016 0949   PROT 7.8 09/09/2016 0949   ALBUMIN 3.7 09/09/2016 0949   AST 16 09/09/2016 0949   ALT 19 09/09/2016 0949   ALKPHOS 176 (H) 09/09/2016 0949   BILITOT 0.23 09/09/2016 0949   GFRNONAA >60 08/31/2016 0539   GFRAA >60 08/31/2016 0539    No results found for: SPEP, UPEP  Lab Results  Component Value Date   WBC 6.1 09/09/2016   NEUTROABS 5.0 09/09/2016   HGB 10.3 (L) 09/09/2016   HCT 29.9 (L) 09/09/2016   MCV 85.7 09/09/2016   PLT 385 09/09/2016      Chemistry      Component Value Date/Time   NA 137 09/09/2016 0949   K 4.4 09/09/2016 0949   CL 106 08/30/2016 0527   CO2 20 (L) 09/09/2016 0949   BUN 24.3 09/09/2016 0949   CREATININE 0.9 09/09/2016 0949      Component Value Date/Time    CALCIUM 10.0 09/09/2016 0949   ALKPHOS 176 (H) 09/09/2016 0949   AST 16 09/09/2016 0949   ALT 19 09/09/2016 0949   BILITOT 0.23 09/09/2016 0949       RADIOGRAPHIC STUDIES: I have personally reviewed the radiological images as listed and agreed with the findings in the report. Ct [place] Right W Contrast  Result Date: 08/30/2016 CLINICAL DATA:  Redness to the anterior right elbow EXAM: CT OF THE UPPER RIGHT EXTREMITY WITH CONTRAST TECHNIQUE: Multidetector CT imaging of the upper right extremity was performed  according to the standard protocol following intravenous contrast administration. COMPARISON:  None. CONTRAST:  127m ISOVUE-300 IOPAMIDOL (ISOVUE-300) INJECTION 61%<Contrast>1082mISOVUE-300 IOPAMIDOL (ISOVUE-300) INJECTION 61% FINDINGS: Bones/Joint/Cartilage No periostitis or fracture.  No large elbow effusion. Ligaments Suboptimally assessed by CT. Muscles and Tendons Normal muscle bulk. No masses. There is fluid and edema around the muscle bellies of the proximal forearm. Soft tissues Moderate fluid, skin thickening and edema posterior aspect of the elbow. There is mild diffuse subcutaneous edema of the distal arm, elbow, and proximal forearm. There is suspected filling defect within a superficial vein along the radial aspect of the proximal forearm near the antecubital fossa. Vessel appears expanded and there is moderate surrounding inflammation. Suspected thrombus extends into what appears to be cephalic vein at the distal arm. IMPRESSION: 1. Skin thickening and moderate fluid and subcutaneous edema within the soft tissues of the distal upper arm, elbow and proximal forearm which may represent a cellulitis. No definite rim enhancing collections to suggest abscess. 2. Suspect thrombophlebitis/thrombosis of superficial vein of the proximal forearm with possible thrombus in the cephalic vein. Further evaluation with venous ultrasound is recommended. Electronically Signed   By: KiDonavan Foil.D.    On: 08/30/2016 01:43    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  Continue supportive care I plan to repeat imaging study at the end of the month to assess response to treatment. The plan is for total of 6 cycles of chemotherapy.  P[place]top[place] improved significantly since the last time she was seen here. I recommend she stop taking Bactrim. She will continue G-CSF support with chemotherapy as prescribed.  From the anemia standpoint, she is currently not symptomatic.  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 I recommend she gets blood draw from her right arm and to get chemotherapy through the left arm. She does not need to continue taking Bactrim   Orders Placed This Encounter  Procedures  . CT ABDOMEN PELVIS W CONTRAST    Standing Status:   Future    Standing Expiration Date:   10/14/2017    Order Specific Question:   Reason for exam:    Answer:   staging endometrium cancer, assess response to Rx    Order Specific Question:   [facility]    [facility]    Standing Status:   Future    Standing Expiration Date:   10/14/2017    Order Specific Question:   Reason for exam:    Answer:   staging endometrium cancer, assess response to Rx    Order Specific Question:   [facility]    [facility] All questions were answered. The patient knows to call the clinic with any problems, questions or concerns. No barriers to learning was detected. I spent 20 minutes counseling the patient face to face. The total time spent in the appointment was 30 minutes and more than 50% was on counseling and review of test results     NiHeath LarkMD 09/09/2016 1:01 PM

## 2016-09-09 NOTE — Telephone Encounter (Signed)
Patient receive AVS in infusion and come bck to pick up oral contrast. [facility] to scehdule CT.

## 2016-09-09 NOTE — Assessment & Plan Note (Addendum)
She has received chemotherapy recently.  Continue supportive care I plan to repeat imaging study at the end of the month to assess response to treatment. The plan is for total of 6 cycles of chemotherapy.

## 2016-09-11 ENCOUNTER — Encounter: Payer: Self-pay | Admitting: Pharmacist

## 2016-09-11 ENCOUNTER — Telehealth: Payer: Self-pay | Admitting: *Deleted

## 2016-09-11 ENCOUNTER — Ambulatory Visit (HOSPITAL_BASED_OUTPATIENT_CLINIC_OR_DEPARTMENT_OTHER): Payer: Medicare Other

## 2016-09-11 DIAGNOSIS — Z5189 Encounter for other specified aftercare: Secondary | ICD-10-CM | POA: Diagnosis not present

## 2016-09-11 DIAGNOSIS — C541 Malignant neoplasm of endometrium: Secondary | ICD-10-CM | POA: Diagnosis present

## 2016-09-11 MED ORDER — PEGFILGRASTIM INJECTION 6 MG/0.6ML ~~LOC~~
6.0000 mg | PREFILLED_SYRINGE | Freq: Once | SUBCUTANEOUS | Status: AC
  Administered 2016-09-11: 6 mg via SUBCUTANEOUS
  Filled 2016-09-11: qty 0.6

## 2016-09-11 NOTE — Telephone Encounter (Signed)
LM to call.  Pt at [facility] for Onpro malfunction

## 2016-09-19 ENCOUNTER — Telehealth: Payer: Self-pay

## 2016-09-19 ENCOUNTER — Other Ambulatory Visit: Payer: Self-pay | Admitting: Hematology and Oncology

## 2016-09-19 DIAGNOSIS — D539 Nutritional anemia, unspecified: Secondary | ICD-10-CM | POA: Insufficient documentation

## 2016-09-19 DIAGNOSIS — G2581 Restless legs syndrome: Secondary | ICD-10-CM | POA: Insufficient documentation

## 2016-09-19 NOTE — Telephone Encounter (Signed)
I have no other suggestion I will check iron studies on 3/26; sometimes taking iron supplement for iron def anemia as a cause of restless legs may help

## 2016-09-19 NOTE — Telephone Encounter (Signed)
lvm per Dr Alvy Bimler

## 2016-09-19 NOTE — Telephone Encounter (Signed)
-----   Message from Loletta Parish sent at 09/19/2016  4:20 PM EDT ----- Regarding: RE: CT scan Authorization Approved.  Thanks  ----- Message ----- From: Heath Lark, MD Sent: 09/19/2016   2:02 PM To: Flo Shanks, RN, Loletta Parish Subject: CT scan                                        I placed order a while ago Needs to be done by next week

## 2016-09-19 NOTE — Telephone Encounter (Signed)
Radiology scheduling will call patient with time for appointment.

## 2016-09-19 NOTE — Telephone Encounter (Signed)
Chemo 3/5. She is having restless leg from knees down. Only at night when in the bed.  Went to bed at 10pm and not able to go to sleep to 430. Started with new chemo in February, taxol/carbo. Neuropathy is better but restlessness is worse. She has tried melatonin, xanax, naproxen, gabapentin, 2 clonipin at HS. 3 XS tylenol this AM. Is taking a warm bath before bedtime. Nothing really helping.  This RN suggested mustard, taking magnesium and calcium at night, quinine in tonic water, possibly benadryl with MD OK.  What else can she do?

## 2016-09-23 ENCOUNTER — Telehealth: Payer: Self-pay | Admitting: *Deleted

## 2016-09-23 ENCOUNTER — Other Ambulatory Visit: Payer: Self-pay | Admitting: Hematology and Oncology

## 2016-09-23 ENCOUNTER — Telehealth: Payer: Self-pay | Admitting: Hematology and Oncology

## 2016-09-23 DIAGNOSIS — R300 Dysuria: Secondary | ICD-10-CM

## 2016-09-23 DIAGNOSIS — C541 Malignant neoplasm of endometrium: Secondary | ICD-10-CM

## 2016-09-23 NOTE — Telephone Encounter (Signed)
When I refilled her prescription recently, there are 6 refills in it Please just get her to call her pharmacist

## 2016-09-23 NOTE — Telephone Encounter (Signed)
Spoke with patient re genetics appointment for 3/29. Other appointments remain the same.  See referral.

## 2016-09-23 NOTE — Telephone Encounter (Signed)
Second call from patient at 63.  "I found the bottle have called for  Refill.  Please disregard the previous message I left earlier,

## 2016-09-23 NOTE — Telephone Encounter (Signed)
I'm to have chemotherapy next week.  I do not have any steroids to take before chemotherapy.  Please call me to let me know if I need steroids [phone]).  An order can be called in to [facility] in [facility]."

## 2016-09-26 ENCOUNTER — Ambulatory Visit: Payer: Medicare Other

## 2016-09-26 ENCOUNTER — Other Ambulatory Visit: Payer: Medicare Other

## 2016-09-27 ENCOUNTER — Ambulatory Visit (HOSPITAL_COMMUNITY)
Admission: RE | Admit: 2016-09-27 | Discharge: 2016-09-27 | Disposition: A | Discharge status: Home / Self Care | Payer: Medicare Other | Source: Ambulatory Visit | Attending: Hematology and Oncology | Admitting: Hematology and Oncology

## 2016-09-27 DIAGNOSIS — C541 Malignant neoplasm of endometrium: Secondary | ICD-10-CM | POA: Diagnosis not present

## 2016-09-27 DIAGNOSIS — I700 Atherosclerosis of aorta: Secondary | ICD-10-CM | POA: Insufficient documentation

## 2016-09-27 MED ORDER — IOPAMIDOL (ISOVUE-300) INJECTION 61%
INTRAVENOUS | Status: AC
  Administered 2016-09-27: 100 mL
  Filled 2016-09-27: qty 100

## 2016-09-30 ENCOUNTER — Other Ambulatory Visit (HOSPITAL_BASED_OUTPATIENT_CLINIC_OR_DEPARTMENT_OTHER): Payer: Medicare Other

## 2016-09-30 ENCOUNTER — Other Ambulatory Visit: Payer: Self-pay | Admitting: Hematology and Oncology

## 2016-09-30 ENCOUNTER — Ambulatory Visit (HOSPITAL_BASED_OUTPATIENT_CLINIC_OR_DEPARTMENT_OTHER): Payer: Medicare Other

## 2016-09-30 ENCOUNTER — Encounter: Payer: Self-pay | Admitting: Hematology and Oncology

## 2016-09-30 ENCOUNTER — Ambulatory Visit (HOSPITAL_BASED_OUTPATIENT_CLINIC_OR_DEPARTMENT_OTHER): Payer: Medicare Other | Admitting: Hematology and Oncology

## 2016-09-30 DIAGNOSIS — N39490 Overflow incontinence: Secondary | ICD-10-CM | POA: Diagnosis not present

## 2016-09-30 DIAGNOSIS — D6481 Anemia due to antineoplastic chemotherapy: Secondary | ICD-10-CM | POA: Diagnosis not present

## 2016-09-30 DIAGNOSIS — Z5189 Encounter for other specified aftercare: Secondary | ICD-10-CM | POA: Diagnosis not present

## 2016-09-30 DIAGNOSIS — G620 Drug-induced polyneuropathy: Secondary | ICD-10-CM

## 2016-09-30 DIAGNOSIS — T451X5A Adverse effect of antineoplastic and immunosuppressive drugs, initial encounter: Secondary | ICD-10-CM | POA: Insufficient documentation

## 2016-09-30 DIAGNOSIS — Z5111 Encounter for antineoplastic chemotherapy: Secondary | ICD-10-CM | POA: Diagnosis present

## 2016-09-30 DIAGNOSIS — D539 Nutritional anemia, unspecified: Secondary | ICD-10-CM

## 2016-09-30 DIAGNOSIS — C541 Malignant neoplasm of endometrium: Secondary | ICD-10-CM

## 2016-09-30 LAB — CBC WITH DIFFERENTIAL/PLATELET
BASO%: 0 % (ref 0.0–2.0)
Basophils Absolute: 0 10*3/uL (ref 0.0–0.1)
EOS%: 0 % (ref 0.0–7.0)
Eosinophils Absolute: 0 10*3/uL (ref 0.0–0.5)
HCT: 31.8 % — ABNORMAL LOW (ref 34.8–46.6)
HGB: 10.9 g/dL — ABNORMAL LOW (ref 11.6–15.9)
LYMPH#: 0.6 10*3/uL — AB (ref 0.9–3.3)
LYMPH%: 9.4 % — AB (ref 14.0–49.7)
MCH: 29.9 pg (ref 25.1–34.0)
MCHC: 34.3 g/dL (ref 31.5–36.0)
MCV: 87.1 fL (ref 79.5–101.0)
MONO#: 0 10*3/uL — ABNORMAL LOW (ref 0.1–0.9)
MONO%: 0.5 % (ref 0.0–14.0)
NEUT#: 5.3 10*3/uL (ref 1.5–6.5)
NEUT%: 90.1 % — AB (ref 38.4–76.8)
Platelets: 228 10*3/uL (ref 145–400)
RBC: 3.65 10*6/uL — AB (ref 3.70–5.45)
RDW: 15.9 % — ABNORMAL HIGH (ref 11.2–14.5)
WBC: 5.8 10*3/uL (ref 3.9–10.3)

## 2016-09-30 LAB — FERRITIN: Ferritin: 292 ng/ml — ABNORMAL HIGH (ref 9–269)

## 2016-09-30 LAB — COMPREHENSIVE METABOLIC PANEL
ALT: 15 U/L (ref 0–55)
AST: 14 U/L (ref 5–34)
Albumin: 3.8 g/dL (ref 3.5–5.0)
Alkaline Phosphatase: 139 U/L (ref 40–150)
Anion Gap: 12 mEq/L — ABNORMAL HIGH (ref 3–11)
BILIRUBIN TOTAL: 0.32 mg/dL (ref 0.20–1.20)
BUN: 18.8 mg/dL (ref 7.0–26.0)
CO2: 21 meq/L — AB (ref 22–29)
CREATININE: 0.8 mg/dL (ref 0.6–1.1)
Calcium: 10.2 mg/dL (ref 8.4–10.4)
Chloride: 106 mEq/L (ref 98–109)
EGFR: 74 mL/min/{1.73_m2} — AB (ref >90)
GLUCOSE: 228 mg/dL — AB (ref 70–140)
Potassium: 3.9 mEq/L (ref 3.5–5.1)
Sodium: 139 mEq/L (ref 136–145)
Total Protein: 7.4 g/dL (ref 6.4–8.3)

## 2016-09-30 LAB — IRON AND TIBC
%SAT: 21 % (ref 21–57)
Iron: 64 ug/dL (ref 41–142)
TIBC: 303 ug/dL (ref 236–444)
UIBC: 239 ug/dL (ref 120–384)

## 2016-09-30 MED ORDER — FAMOTIDINE IN NACL 20-0.9 MG/50ML-% IV SOLN
20.0000 mg | Freq: Two times a day (BID) | INTRAVENOUS | Status: DC
  Administered 2016-09-30: 20 mg via INTRAVENOUS

## 2016-09-30 MED ORDER — FAMOTIDINE IN NACL 20-0.9 MG/50ML-% IV SOLN
INTRAVENOUS | Status: AC
  Filled 2016-09-30: qty 50

## 2016-09-30 MED ORDER — FOSAPREPITANT DIMEGLUMINE INJECTION 150 MG
Freq: Once | INTRAVENOUS | Status: AC
  Administered 2016-09-30: 11:00:00 via INTRAVENOUS
  Filled 2016-09-30: qty 5

## 2016-09-30 MED ORDER — PACLITAXEL CHEMO INJECTION 300 MG/50ML
131.2500 mg/m2 | Freq: Once | INTRAVENOUS | Status: AC
  Administered 2016-09-30: 276 mg via INTRAVENOUS
  Filled 2016-09-30: qty 46

## 2016-09-30 MED ORDER — DIPHENHYDRAMINE HCL 50 MG/ML IJ SOLN
50.0000 mg | Freq: Once | INTRAMUSCULAR | Status: AC
  Administered 2016-09-30: 50 mg via INTRAVENOUS

## 2016-09-30 MED ORDER — PALONOSETRON HCL INJECTION 0.25 MG/5ML
0.2500 mg | Freq: Once | INTRAVENOUS | Status: AC
Start: 2016-09-30 — End: 2016-09-30
  Administered 2016-09-30: 0.25 mg via INTRAVENOUS

## 2016-09-30 MED ORDER — PALONOSETRON HCL INJECTION 0.25 MG/5ML
INTRAVENOUS | Status: AC
  Filled 2016-09-30: qty 5

## 2016-09-30 MED ORDER — SODIUM CHLORIDE 0.9 % IV SOLN
Freq: Once | INTRAVENOUS | Status: AC
  Administered 2016-09-30: 11:00:00 via INTRAVENOUS

## 2016-09-30 MED ORDER — DIPHENHYDRAMINE HCL 50 MG/ML IJ SOLN
INTRAMUSCULAR | Status: AC
  Filled 2016-09-30: qty 1

## 2016-09-30 MED ORDER — SODIUM CHLORIDE 0.9 % IV SOLN
640.8000 mg | Freq: Once | INTRAVENOUS | Status: AC
  Administered 2016-09-30: 640 mg via INTRAVENOUS
  Filled 2016-09-30: qty 64

## 2016-09-30 MED ORDER — PEGFILGRASTIM 6 MG/0.6ML ~~LOC~~ PSKT
6.0000 mg | PREFILLED_SYRINGE | Freq: Once | SUBCUTANEOUS | Status: AC
  Administered 2016-09-30: 6 mg via SUBCUTANEOUS
  Filled 2016-09-30: qty 0.6

## 2016-09-30 NOTE — Progress Notes (Signed)
[facility] OFFICE PROGRESS NOTE  Patient Care Team: [name], MD as PCP - General (Family Medicine)  SUMMARY OF ONCOLOGIC HISTORY:   Recurrent carcinoma of endometrium ([facility])   01/02/2016 Pathology Results    Uterus +/- tubes/ovaries, neoplastic, cervix ENDOMETRIAL ADENOCARCINOMA, FIGO GRADE 2 (4.7 CM) THE TUMOR INVADES LESS THAN ONE-HALF OF THE MYOMETRIUM (PT1A) ALL MARGINS OF RESECTION ARE NEGATIVE FOR CARCINOMA LEIOMYOMAS AND ADENOMYOSIS BILATERAL FALLOPIAN TUBES AND OVARIES: HISTOLOGICAL UNREMARKABLE 2. Lymph node, sentinel, biopsy, right obturator ONE BENIGN LYMPH NODE (0/1) 3. Lymph nodes, regional resection, left pelvic FOUR BENIGN LYMPH NODES (0/4)      01/02/2016 Surgery    Dr. Denman George performed robotic-assisted laparoscopic total hysterectomy with bilateral salpingoophorectomy, sentinel lymph node biopsy, lymphadenectomy        05/15/2016 Pathology Results    Vagina, biopsy, mid - ADENOCARCINOMA, SEE COMMENT. Microscopic Comment The morphology along with the patient's history are consistent with recurrent endometrioid adenocarcinoma. The carcinoma has a similar appearance to the primary ([id number]).      05/20/2016 Imaging    Ct scan abdomen showed solid 2.5 cm peritoneal mass in the mid to anterior left pelvis, suspicious for peritoneal metastasis. 2. Small expansile low-attenuation filling defect in the left external iliac vein, cannot exclude a small deep venous thrombus. Consider correlation with left lower extremity venous Doppler scan. 3. Small simple fluid density structure in the left pelvic sidewall abutting the left external iliac vessels, favor a small postoperative seroma. 4. No ascites. 5. No lymphadenopathy.  No metastatic disease in the chest. 6. Aortic atherosclerosis.      06/12/2016 PET scan    Intensely hypermetabolic 2.1 cm central pelvic peritoneal mass just to the left of midline, consistent with peritoneal metastatic recurrence.  No ascites. 2. No additional hypermetabolic sites of metastatic disease. 3. Diffuse thyroid hypermetabolism without discrete thyroid nodule, favoring thyroiditis. Recommend correlation with serum thyroid function tests.      06/24/2016 - 07/22/2016 Chemotherapy    The patient had weekly cisplatin. She has missed several doses due to infection and pancytopenia      06/24/2016 - 08/01/2016 Radiation Therapy    She completed concurrent radiation therapy Radiation treatment dates:   IMRT : 06/24/16 - 08/01/16 HDR : 08/13/16, 08/20/16, 08/27/16, 09/04/16  Site/dose:   Pelvis treated to 55 Gy in 25 fractions (simultaneous integrated boost technique) Vaginal Cuff treated to 24 Gy in 4 fractions      09/27/2016 Imaging    Interval decrease in size of previously described solid peritoneal nodule within the left anterior pelvis. Near complete resolution of previously described low-attenuation structure along the left pelvic sidewall. No evidence for metastatic disease in the chest. Aortic atherosclerosis.       INTERVAL HISTORY: Please see below for problem oriented charting. She returns for further follow-up, prior to cycle 3 of chemotherapy. She has started to experience some peripheral neuropathy from treatment. Denies nausea vomiting. No recent infection. She also complained of occasional urinary incontinence.  She denies constipation.  REVIEW OF SYSTEMS:   Constitutional: Denies fevers, chills or abnormal weight loss Eyes: Denies blurriness of vision Ears, nose, mouth, throat, and face: Denies mucositis or sore throat Respiratory: Denies cough, dyspnea or wheezes Cardiovascular: Denies palpitation, chest discomfort or lower extremity swelling Gastrointestinal:  Denies nausea, heartburn or change in bowel habits Skin: Denies abnormal skin rashes Lymphatics: Denies new lymphadenopathy or easy bruising Neurological:Denies numbness, tingling or new weaknesses Behavioral/Psych: Mood is stable,  no new changes  All other  systems were reviewed with the patient and are negative.  I have reviewed the past medical history, past surgical history, social history and family history with the patient and they are unchanged from previous note.  ALLERGIES:  is allergic to latex.  MEDICATIONS:  Current Outpatient Prescriptions  Medication Sig Dispense Refill  . B Complex Vitamins (VITAMIN B COMPLEX) TABS Take 1 tablet by mouth daily.     . Calcium Carbonate-Vitamin D (CALTRATE COLON HEALTH PO) Take 1 tablet by mouth daily.    . chlorhexidine (PERIDEX) 0.12 % solution Use as directed 15 mLs in the mouth or throat 2 (two) times daily. 120 mL 0  . clonazePAM (KLONOPIN) 0.5 MG tablet Take 0.5 mg by mouth daily as needed (restless legs).    Marland Kitchen dexamethasone (DECADRON) 4 MG tablet Take 5 tablets with food 12 hrs and 6 hrs prior to Taxol chemotherapy. 10 tablet 6  . fluticasone (FLONASE) 50 MCG/ACT nasal spray Place 1 spray into both nostrils 2 (two) times daily as needed.  1  . gabapentin (NEURONTIN) 600 MG tablet Take 1 tablet (600 mg total) by mouth 2 (two) times daily. 60 tablet 0  . levothyroxine (SYNTHROID, LEVOTHROID) 88 MCG tablet Take 88 mcg by mouth daily.  3  . magnesium 30 MG tablet Take 30 mg by mouth daily.     . Melatonin 3 MG TBDP Take 3 mg by mouth at bedtime.     . Multiple Vitamin (MULTIVITAMIN WITH MINERALS) TABS tablet Take 1 tablet by mouth daily.    . Potassium (POTASSIMIN) 75 MG TABS Take 75 mg by mouth daily.    . protein supplement shake (PREMIER PROTEIN) LIQD Take 325 mLs (11 oz total) by mouth 2 (two) times daily between meals. 14 Can 0  . traZODone (DESYREL) 50 MG tablet     . ALPRAZolam (XANAX) 1 MG tablet Take 1/2 to one tablet at bedtime as needed for insomnia and restless legs.    . famotidine (PEPCID) 20 MG tablet Take 1 tablet (20 mg total) by mouth 2 (two) times daily as needed for heartburn or indigestion. (Patient not taking: Reported on 09/09/2016) 30 tablet 0  .  loperamide (IMODIUM) 2 MG capsule Take by mouth as needed for diarrhea or loose stools.    Marland Kitchen LORazepam (ATIVAN) 1 MG tablet Place 1 tablet (1 mg total) under the tongue every 8 (eight) hours as needed (nausea). (Patient not taking: Reported on 09/09/2016) 30 tablet 0  . Morphine Sulfate (MORPHINE CONCENTRATE) 10 MG/0.5ML SOLN concentrated solution Take 0.5 mLs (10 mg total) by mouth every 2 (two) hours as needed for severe pain. (Patient not taking: Reported on 09/09/2016) 15 mL 0  . Naproxen Sodium 220 MG CAPS Take 1 capsule (220 mg total) by mouth 2 (two) times daily as needed (pain). (Patient not taking: Reported on 09/30/2016) 60 each 0  . ondansetron (ZOFRAN) 8 MG tablet Take 1 tablet (8 mg total) by mouth every 8 (eight) hours as needed for nausea. (Patient not taking: Reported on 09/09/2016) 60 tablet 1  . oxyCODONE (OXY IR/ROXICODONE) 5 MG immediate release tablet Take 1 tablet (5 mg total) by mouth every 4 (four) hours as needed for severe pain. (Patient not taking: Reported on 09/09/2016) 10 tablet 0  . prochlorperazine (COMPAZINE) 10 MG tablet Take 1 tablet (10 mg total) by mouth every 6 (six) hours as needed for nausea. (Patient not taking: Reported on 09/09/2016) 20 tablet 0  . traMADol (ULTRAM) 50 MG tablet  No current facility-administered medications for this visit.    Facility-Administered Medications Ordered in Other Visits  Medication Dose Route Frequency Provider Last Rate Last Dose  . CARBOplatin (PARAPLATIN) 640 mg in sodium chloride 0.9 % 250 mL chemo infusion  640 mg Intravenous Once Heath Lark, MD      . famotidine (PEPCID) IVPB 20 mg premix  20 mg Intravenous Q12H Heath Lark, MD   20 mg at 09/30/16 1109  . PACLitaxel (TAXOL) 276 mg in dextrose 5 % 250 mL chemo infusion (> 54m/m2)  131.25 mg/m2 (Treatment Plan Recorded) Intravenous Once NHeath Lark MD 99 mL/hr at 09/30/16 1207 276 mg at 09/30/16 1207  . pegfilgrastim (NEULASTA ONPRO KIT) injection 6 mg  6 mg Subcutaneous Once NHeath Lark MD        PHYSICAL EXAMINATION: ECOG PERFORMANCE STATUS: 1 - Symptomatic but completely ambulatory  Vitals:   09/30/16 0957  BP: (!) 144/76  Pulse: (!) 108  Resp: 18  Temp: 98.4 F (36.9 C)   Filed Weights   09/30/16 0957  Weight: 210 lb 14.4 oz (95.7 kg)    GENERAL:alert, no distress and comfortable SKIN: skin color, texture, turgor are normal, no rashes or significant lesions EYES: normal, Conjunctiva are pink and non-injected, sclera clear OROPHARYNX:no exudate, no erythema and lips, buccal mucosa, and tongue normal  NECK: supple, thyroid normal size, non-tender, without nodularity LYMPH:  no palpable lymphadenopathy in the cervical, axillary or inguinal LUNGS: clear to auscultation and percussion with normal breathing effort HEART: regular rate & rhythm and no murmurs and no lower extremity edema ABDOMEN:abdomen soft, non-tender and normal bowel sounds Musculoskeletal:no cyanosis of digits and no clubbing  NEURO: alert & oriented x 3 with fluent speech, no focal motor/sensory deficits  LABORATORY DATA:  I have reviewed the data as listed    Component Value Date/Time   NA 139 09/30/2016 0922   K 3.9 09/30/2016 0922   CL 106 08/30/2016 0527   CO2 21 (L) 09/30/2016 0922   GLUCOSE 228 (H) 09/30/2016 0922   BUN 18.8 09/30/2016 0922   CREATININE 0.8 09/30/2016 0922   CALCIUM 10.2 09/30/2016 0922   PROT 7.4 09/30/2016 0922   ALBUMIN 3.8 09/30/2016 0922   AST 14 09/30/2016 0922   ALT 15 09/30/2016 0922   ALKPHOS 139 09/30/2016 0922   BILITOT 0.32 09/30/2016 0922   GFRNONAA >60 08/31/2016 0539   GFRAA >60 08/31/2016 0539    No results found for: SPEP, UPEP  Lab Results  Component Value Date   WBC 5.8 09/30/2016   NEUTROABS 5.3 09/30/2016   HGB 10.9 (L) 09/30/2016   HCT 31.8 (L) 09/30/2016   MCV 87.1 09/30/2016   PLT 228 09/30/2016      Chemistry      Component Value Date/Time   NA 139 09/30/2016 0922   K 3.9 09/30/2016 0922   CL 106 08/30/2016  0527   CO2 21 (L) 09/30/2016 0922   BUN 18.8 09/30/2016 0922   CREATININE 0.8 09/30/2016 0922      Component Value Date/Time   CALCIUM 10.2 09/30/2016 0922   ALKPHOS 139 09/30/2016 0922   AST 14 09/30/2016 0922   ALT 15 09/30/2016 0922   BILITOT 0.32 09/30/2016 0922       RADIOGRAPHIC STUDIES: I have personally reviewed the radiological images as listed and agreed with the findings in the report. Ct Chest W Contrast  Result Date: 09/27/2016 CLINICAL DATA:  Patient with history of endometrial carcinoma. Assess treatment response. EXAM: CT CHEST,  ABDOMEN, AND PELVIS WITH CONTRAST TECHNIQUE: Multidetector CT imaging of the chest, abdomen and pelvis was performed following the standard protocol during bolus administration of intravenous contrast. CONTRAST:  100 ISOVUE-300 IOPAMIDOL (ISOVUE-300) INJECTION 61% COMPARISON:  PET-CT 06/12/2016; CT chest abdomen pelvis 05/20/2016 FINDINGS: CT CHEST FINDINGS Cardiovascular: Normal heart size. No pericardial effusion. Aorta and main pulmonary artery normal in caliber. Mediastinum/Nodes: No enlarged axillary, mediastinal or hilar lymphadenopathy. Lungs/Pleura: Central airways are patent. Dependent atelectasis bilateral lower lobes and lingula. Unchanged 3 mm right upper lobe pulmonary nodule (image 35; series 6), stable over time, compatible with benign etiology. No large area of pulmonary consolidation. No pleural effusion or pneumothorax. Musculoskeletal: No aggressive or acute appearing osseous lesions. CT ABDOMEN PELVIS FINDINGS Hepatobiliary: Stable subcentimeter low-attenuation lesion within the hepatic dome (image 47; series 2). No new or enlarging hepatic lesions are identified. Gallbladder is decompressed. No intrahepatic or extrahepatic biliary ductal dilatation. Pancreas: Unremarkable Spleen: Unremarkable Adrenals/Urinary Tract: The adrenal glands are normal. Kidneys enhance symmetrically with contrast. No hydronephrosis. Urinary bladder is  unremarkable. Stomach/Bowel: Normal morphology of the stomach. No abnormal bowel wall thickening or evidence for bowel obstruction. No free fluid or free intraperitoneal air. Normal appendix. Vascular/Lymphatic: Normal caliber abdominal aorta. No retroperitoneal lymphadenopathy. Reproductive: Patient status post hysterectomy. Interval decrease in size of previously described solid nodule within the anterior left pelvis measuring 1.3 x 0.9 cm (image 102; series 2), previously 2.5 x 2.1 cm. Interval decrease and near complete resolution of previously described adjacent low-attenuation structure along the pelvic side wall. Other: None Musculoskeletal: Lumbar spine degenerative changes. No aggressive or acute appearing osseous lesions. IMPRESSION: Interval decrease in size of previously described solid peritoneal nodule within the left anterior pelvis. Near complete resolution of previously described low-attenuation structure along the left pelvic sidewall. No evidence for metastatic disease in the chest. Aortic atherosclerosis. Electronically Signed   By: Lovey Newcomer M.D.   On: 09/27/2016 14:37   Ct Abdomen Pelvis W Contrast  Result Date: 09/27/2016 CLINICAL DATA:  Patient with history of endometrial carcinoma. Assess treatment response. EXAM: CT CHEST, ABDOMEN, AND PELVIS WITH CONTRAST TECHNIQUE: Multidetector CT imaging of the chest, abdomen and pelvis was performed following the standard protocol during bolus administration of intravenous contrast. CONTRAST:  100 ISOVUE-300 IOPAMIDOL (ISOVUE-300) INJECTION 61% COMPARISON:  PET-CT 06/12/2016; CT chest abdomen pelvis 05/20/2016 FINDINGS: CT CHEST FINDINGS Cardiovascular: Normal heart size. No pericardial effusion. Aorta and main pulmonary artery normal in caliber. Mediastinum/Nodes: No enlarged axillary, mediastinal or hilar lymphadenopathy. Lungs/Pleura: Central airways are patent. Dependent atelectasis bilateral lower lobes and lingula. Unchanged 3 mm right  upper lobe pulmonary nodule (image 35; series 6), stable over time, compatible with benign etiology. No large area of pulmonary consolidation. No pleural effusion or pneumothorax. Musculoskeletal: No aggressive or acute appearing osseous lesions. CT ABDOMEN PELVIS FINDINGS Hepatobiliary: Stable subcentimeter low-attenuation lesion within the hepatic dome (image 47; series 2). No new or enlarging hepatic lesions are identified. Gallbladder is decompressed. No intrahepatic or extrahepatic biliary ductal dilatation. Pancreas: Unremarkable Spleen: Unremarkable Adrenals/Urinary Tract: The adrenal glands are normal. Kidneys enhance symmetrically with contrast. No hydronephrosis. Urinary bladder is unremarkable. Stomach/Bowel: Normal morphology of the stomach. No abnormal bowel wall thickening or evidence for bowel obstruction. No free fluid or free intraperitoneal air. Normal appendix. Vascular/Lymphatic: Normal caliber abdominal aorta. No retroperitoneal lymphadenopathy. Reproductive: Patient status post hysterectomy. Interval decrease in size of previously described solid nodule within the anterior left pelvis measuring 1.3 x 0.9 cm (image 102; series 2), previously 2.5 x 2.1 cm.  Interval decrease and near complete resolution of previously described adjacent low-attenuation structure along the pelvic side wall. Other: None Musculoskeletal: Lumbar spine degenerative changes. No aggressive or acute appearing osseous lesions. IMPRESSION: Interval decrease in size of previously described solid peritoneal nodule within the left anterior pelvis. Near complete resolution of previously described low-attenuation structure along the left pelvic sidewall. No evidence for metastatic disease in the chest. Aortic atherosclerosis. Electronically Signed   By: Lovey Newcomer M.D.   On: 09/27/2016 14:37    ASSESSMENT & PLAN:  Recurrent carcinoma of endometrium [facility]) The patient is delighted to see  positive response to treatment. I  reviewed the plan of care as outlined by her GYN oncologist last year. The total chemotherapy treatment is to get 6 cycles of carboplatin and Taxol, not 4 cycles. I explained to the patient the rationale behind this. She is has started to experience some trouble some peripheral neuropathy. I plan to reduce a dose of Taxol but proceed with full dose carboplatin.  She agree with the plan  Peripheral neuropathy due to chemotherapy [facility]) she has mild peripheral neuropathy, likely related to side effects of treatment. I plan to reduce the dose of treatment as outlined above.  I explained to the patient the rationale of this strategy and reassured the patient it would not compromise the efficacy of treatment   Anemia due to antineoplastic chemotherapy This is likely due to recent treatment. The patient denies recent history of bleeding such as epistaxis, hematuria or hematochezia. She is asymptomatic from the anemia. I will observe for now.  She does not require transfusion now. I will continue the chemotherapy at current dose without dosage adjustment.  If the anemia gets progressive worse in the future, I might have to delay her treatment or adjust the chemotherapy dose.   Urinary incontinence, overflow She has occasional incontinence, especially in the first few days of treatment. I recommend frequent emptying of bladder in the first few days. I do not believe she have evidence of urinary tract infection   No orders of the defined types were placed in this encounter.  All questions were answered. The patient knows to call the clinic with any problems, questions or concerns. No barriers to learning was detected. I spent 30 minutes counseling the patient face to face. The total time spent in the appointment was 40 minutes and more than 50% was on counseling and review of test results     Heath Lark, MD 09/30/2016 12:20 PM

## 2016-09-30 NOTE — Assessment & Plan Note (Signed)
The patient is delighted to see  positive response to treatment. I reviewed the plan of care as outlined by her GYN oncologist last year. The total chemotherapy treatment is to get 6 cycles of carboplatin and Taxol, not 4 cycles. I explained to the patient the rationale behind this. She is has started to experience some trouble some peripheral neuropathy. I plan to reduce a dose of Taxol but proceed with full dose carboplatin.  She agree with the plan

## 2016-09-30 NOTE — Assessment & Plan Note (Signed)
she has mild peripheral neuropathy, likely related to side effects of treatment. °I plan to reduce the dose of treatment as outlined above.  °I explained to the patient the rationale of this strategy and reassured the patient it would not compromise the efficacy of treatment ° °

## 2016-09-30 NOTE — Assessment & Plan Note (Signed)

## 2016-09-30 NOTE — Patient Instructions (Signed)
[facility] Discharge Instructions for Patients Receiving Chemotherapy  Today you received the following chemotherapy agents:  Carboplatin, Taxol  To help prevent nausea and vomiting after your treatment, we encourage you to take your nausea medication as prescribed.   If you develop nausea and vomiting that is not controlled by your nausea medication, call the clinic.   BELOW ARE SYMPTOMS THAT SHOULD BE REPORTED IMMEDIATELY:  *FEVER GREATER THAN 100.5 F  *CHILLS WITH OR WITHOUT FEVER  NAUSEA AND VOMITING THAT IS NOT CONTROLLED WITH YOUR NAUSEA MEDICATION  *UNUSUAL SHORTNESS OF BREATH  *UNUSUAL BRUISING OR BLEEDING  TENDERNESS IN MOUTH AND THROAT WITH OR WITHOUT PRESENCE OF ULCERS  *URINARY PROBLEMS  *BOWEL PROBLEMS  UNUSUAL RASH Items with * indicate a potential emergency and should be followed up as soon as possible.  Feel free to call the clinic you have any questions or concerns. The clinic phone number is [phone].  Please show the CHEMO ALERT CARD at check-in to the Emergency Department and triage nurse.   

## 2016-09-30 NOTE — Assessment & Plan Note (Signed)
She has occasional incontinence, especially in the first few days of treatment. I recommend frequent emptying of bladder in the first few days. I do not believe she have evidence of urinary tract infection

## 2016-10-03 ENCOUNTER — Other Ambulatory Visit: Payer: Medicare Other

## 2016-10-03 ENCOUNTER — Encounter: Payer: Self-pay | Admitting: Genetics

## 2016-10-03 ENCOUNTER — Ambulatory Visit (HOSPITAL_BASED_OUTPATIENT_CLINIC_OR_DEPARTMENT_OTHER): Payer: Medicare Other | Admitting: Genetics

## 2016-10-03 ENCOUNTER — Ambulatory Visit
Admission: RE | Admit: 2016-10-03 | Discharge: 2016-10-03 | Disposition: A | Discharge status: Home / Self Care | Payer: Medicare Other | Source: Ambulatory Visit | Attending: Radiation Oncology | Admitting: Radiation Oncology

## 2016-10-03 VITALS — BP 131/72 | HR 93 | Temp 98.4°F | Resp 18 | Wt 210.4 lb

## 2016-10-03 DIAGNOSIS — Z8052 Family history of malignant neoplasm of bladder: Secondary | ICD-10-CM

## 2016-10-03 DIAGNOSIS — Z9221 Personal history of antineoplastic chemotherapy: Secondary | ICD-10-CM | POA: Insufficient documentation

## 2016-10-03 DIAGNOSIS — Z808 Family history of malignant neoplasm of other organs or systems: Secondary | ICD-10-CM

## 2016-10-03 DIAGNOSIS — Z79899 Other long term (current) drug therapy: Secondary | ICD-10-CM | POA: Diagnosis not present

## 2016-10-03 DIAGNOSIS — C541 Malignant neoplasm of endometrium: Secondary | ICD-10-CM

## 2016-10-03 DIAGNOSIS — Z923 Personal history of irradiation: Secondary | ICD-10-CM | POA: Diagnosis not present

## 2016-10-03 DIAGNOSIS — Z79891 Long term (current) use of opiate analgesic: Secondary | ICD-10-CM | POA: Insufficient documentation

## 2016-10-03 NOTE — Progress Notes (Signed)
Radiation Oncology         [phone] ________________________________  Name: BATINA DOUGAN MRN: [id number]  Date: 10/03/2016  DOB: [birth year]  Follow-Up Visit Note  CC: Michelle Seller, MD  Michelle Rosario    ICD-9-CM ICD-10-CM   1. Recurrent carcinoma of endometrium (HCC) 182.0 C54.1     Diagnosis:   FIGO Stage IA (pT1a, pN0) Grade 2 endometrial adenocarcinoma, now with vaginal and pelvic recurrence  Interval Since Last Radiation:  1 month  06/24/16 - 08/01/16 : Pelvis treated to 55 Gy in 25 fractions. Vaginal Cuff treated to 24 Gy in 4 fractions.  Narrative:  The patient returns today for routine follow-up of radiation completed 08/01/16.    On review of systems, the patient denies any pain.  Patient states energy level has been fine until today.  Patient states appetite has been good until today, and she is unsure what is causing this. Patient denies vaginal bleeding. Denies blood in urine or stool. Denies constipation/diarrhea. Patient states that she has urine incontinence day of chemo and day after. Denies abdominal pain.                          ALLERGIES:  is allergic to latex.  Meds: Current Outpatient Prescriptions  Medication Sig Dispense Refill  . ALPRAZolam (XANAX) 1 MG tablet Take 1/2 to one tablet at bedtime as needed for insomnia and restless legs.    . B Complex Vitamins (VITAMIN B COMPLEX) TABS Take 1 tablet by mouth daily.     . Calcium Carbonate-Vitamin D (CALTRATE COLON HEALTH PO) Take 1 tablet by mouth daily.    . chlorhexidine (PERIDEX) 0.12 % solution Use as directed 15 mLs in the mouth or throat 2 (two) times daily. 120 mL 0  . clonazePAM (KLONOPIN) 0.5 MG tablet Take 0.5 mg by mouth daily as needed (restless legs).    Marland Kitchen dexamethasone (DECADRON) 4 MG tablet Take 5 tablets with food 12 hrs and 6 hrs prior to Taxol chemotherapy. 10 tablet 6  . fluticasone (FLONASE) 50 MCG/ACT nasal spray Place 1 spray into both nostrils 2 (two) times daily as  needed.  1  . gabapentin (NEURONTIN) 600 MG tablet Take 1 tablet (600 mg total) by mouth 2 (two) times daily. 60 tablet 0  . levothyroxine (SYNTHROID, LEVOTHROID) 88 MCG tablet Take 88 mcg by mouth daily.  3  . magnesium 30 MG tablet Take 30 mg by mouth daily.     . Melatonin 3 MG TBDP Take 3 mg by mouth at bedtime.     . Multiple Vitamin (MULTIVITAMIN WITH MINERALS) TABS tablet Take 1 tablet by mouth daily.    . Potassium (POTASSIMIN) 75 MG TABS Take 75 mg by mouth daily.    . protein supplement shake (PREMIER PROTEIN) LIQD Take 325 mLs (11 oz total) by mouth 2 (two) times daily between meals. 14 Can 0  . traZODone (DESYREL) 50 MG tablet     . famotidine (PEPCID) 20 MG tablet Take 1 tablet (20 mg total) by mouth 2 (two) times daily as needed for heartburn or indigestion. (Patient not taking: Reported on 09/09/2016) 30 tablet 0  . loperamide (IMODIUM) 2 MG capsule Take by mouth as needed for diarrhea or loose stools.    Marland Kitchen LORazepam (ATIVAN) 1 MG tablet Place 1 tablet (1 mg total) under the tongue every 8 (eight) hours as needed (nausea). (Patient not taking: Reported on 09/09/2016) 30 tablet 0  . Morphine Sulfate (  MORPHINE CONCENTRATE) 10 MG/0.5ML SOLN concentrated solution Take 0.5 mLs (10 mg total) by mouth every 2 (two) hours as needed for severe pain. (Patient not taking: Reported on 09/09/2016) 15 mL 0  . Naproxen Sodium 220 MG CAPS Take 1 capsule (220 mg total) by mouth 2 (two) times daily as needed (pain). (Patient not taking: Reported on 09/30/2016) 60 each 0  . ondansetron (ZOFRAN) 8 MG tablet Take 1 tablet (8 mg total) by mouth every 8 (eight) hours as needed for nausea. (Patient not taking: Reported on 09/09/2016) 60 tablet 1  . oxyCODONE (OXY IR/ROXICODONE) 5 MG immediate release tablet Take 1 tablet (5 mg total) by mouth every 4 (four) hours as needed for severe pain. (Patient not taking: Reported on 09/09/2016) 10 tablet 0  . prochlorperazine (COMPAZINE) 10 MG tablet Take 1 tablet (10 mg total) by  mouth every 6 (six) hours as needed for nausea. (Patient not taking: Reported on 09/09/2016) 20 tablet 0  . traMADol (ULTRAM) 50 MG tablet      No current facility-administered medications for this encounter.     Physical Findings: The patient is in no acute distress. Patient is alert and oriented.  weight is 210 lb 6.4 oz (95.4 kg). Her oral temperature is 98.4 F (36.9 C). Her blood pressure is 131/72 and her pulse is 93. Her respiration is 18 and oxygen saturation is 98%.   No significant changes. Lungs clear to auscultation bilaterally. Heart regular in rate and rhythm. Abdomen soft, non tender, with normal active bowel sounds. Pelvic exam deferred today due to recent treatment.  Lab Findings: Lab Results  Component Value Date   WBC 5.8 09/30/2016   HGB 10.9 (L) 09/30/2016   HCT 31.8 (L) 09/30/2016   MCV 87.1 09/30/2016   PLT 228 09/30/2016    Radiographic Findings: Ct Chest W Contrast  Result Date: 09/27/2016 CLINICAL DATA:  Patient with history of endometrial carcinoma. Assess treatment response. EXAM: CT CHEST, ABDOMEN, AND PELVIS WITH CONTRAST TECHNIQUE: Multidetector CT imaging of the chest, abdomen and pelvis was performed following the standard protocol during bolus administration of intravenous contrast. CONTRAST:  100 ISOVUE-300 IOPAMIDOL (ISOVUE-300) INJECTION 61% COMPARISON:  PET-CT 06/12/2016; CT chest abdomen pelvis 05/20/2016 FINDINGS: CT CHEST FINDINGS Cardiovascular: Normal heart size. No pericardial effusion. Aorta and main pulmonary artery normal in caliber. Mediastinum/Nodes: No enlarged axillary, mediastinal or hilar lymphadenopathy. Lungs/Pleura: Central airways are patent. Dependent atelectasis bilateral lower lobes and lingula. Unchanged 3 mm right upper lobe pulmonary nodule (image 35; series 6), stable over time, compatible with benign etiology. No large area of pulmonary consolidation. No pleural effusion or pneumothorax. Musculoskeletal: No aggressive or acute  appearing osseous lesions. CT ABDOMEN PELVIS FINDINGS Hepatobiliary: Stable subcentimeter low-attenuation lesion within the hepatic dome (image 47; series 2). No new or enlarging hepatic lesions are identified. Gallbladder is decompressed. No intrahepatic or extrahepatic biliary ductal dilatation. Pancreas: Unremarkable Spleen: Unremarkable Adrenals/Urinary Tract: The adrenal glands are normal. Kidneys enhance symmetrically with contrast. No hydronephrosis. Urinary bladder is unremarkable. Stomach/Bowel: Normal morphology of the stomach. No abnormal bowel wall thickening or evidence for bowel obstruction. No free fluid or free intraperitoneal air. Normal appendix. Vascular/Lymphatic: Normal caliber abdominal aorta. No retroperitoneal lymphadenopathy. Reproductive: Patient status post hysterectomy. Interval decrease in size of previously described solid nodule within the anterior left pelvis measuring 1.3 x 0.9 cm (image 102; series 2), previously 2.5 x 2.1 cm. Interval decrease and near complete resolution of previously described adjacent low-attenuation structure along the pelvic side wall. Other: None Musculoskeletal:  Lumbar spine degenerative changes. No aggressive or acute appearing osseous lesions. IMPRESSION: Interval decrease in size of previously described solid peritoneal nodule within the left anterior pelvis. Near complete resolution of previously described low-attenuation structure along the left pelvic sidewall. No evidence for metastatic disease in the chest. Aortic atherosclerosis. Electronically Signed   By: Lovey Newcomer M.D.   On: 09/27/2016 14:37   Ct Abdomen Pelvis W Contrast  Result Date: 09/27/2016 CLINICAL DATA:  Patient with history of endometrial carcinoma. Assess treatment response. EXAM: CT CHEST, ABDOMEN, AND PELVIS WITH CONTRAST TECHNIQUE: Multidetector CT imaging of the chest, abdomen and pelvis was performed following the standard protocol during bolus administration of intravenous  contrast. CONTRAST:  100 ISOVUE-300 IOPAMIDOL (ISOVUE-300) INJECTION 61% COMPARISON:  PET-CT 06/12/2016; CT chest abdomen pelvis 05/20/2016 FINDINGS: CT CHEST FINDINGS Cardiovascular: Normal heart size. No pericardial effusion. Aorta and main pulmonary artery normal in caliber. Mediastinum/Nodes: No enlarged axillary, mediastinal or hilar lymphadenopathy. Lungs/Pleura: Central airways are patent. Dependent atelectasis bilateral lower lobes and lingula. Unchanged 3 mm right upper lobe pulmonary nodule (image 35; series 6), stable over time, compatible with benign etiology. No large area of pulmonary consolidation. No pleural effusion or pneumothorax. Musculoskeletal: No aggressive or acute appearing osseous lesions. CT ABDOMEN PELVIS FINDINGS Hepatobiliary: Stable subcentimeter low-attenuation lesion within the hepatic dome (image 47; series 2). No new or enlarging hepatic lesions are identified. Gallbladder is decompressed. No intrahepatic or extrahepatic biliary ductal dilatation. Pancreas: Unremarkable Spleen: Unremarkable Adrenals/Urinary Tract: The adrenal glands are normal. Kidneys enhance symmetrically with contrast. No hydronephrosis. Urinary bladder is unremarkable. Stomach/Bowel: Normal morphology of the stomach. No abnormal bowel wall thickening or evidence for bowel obstruction. No free fluid or free intraperitoneal air. Normal appendix. Vascular/Lymphatic: Normal caliber abdominal aorta. No retroperitoneal lymphadenopathy. Reproductive: Patient status post hysterectomy. Interval decrease in size of previously described solid nodule within the anterior left pelvis measuring 1.3 x 0.9 cm (image 102; series 2), previously 2.5 x 2.1 cm. Interval decrease and near complete resolution of previously described adjacent low-attenuation structure along the pelvic side wall. Other: None Musculoskeletal: Lumbar spine degenerative changes. No aggressive or acute appearing osseous lesions. IMPRESSION: Interval  decrease in size of previously described solid peritoneal nodule within the left anterior pelvis. Near complete resolution of previously described low-attenuation structure along the left pelvic sidewall. No evidence for metastatic disease in the chest. Aortic atherosclerosis. Electronically Signed   By: Lovey Newcomer M.D.   On: 09/27/2016 14:37    Impression:  The patient is recovering from the effects of radiation.  Plan:  Patient was given a small+ and medium dilator and was educated on importance and use. The patient will follow up with Dr. Denman George in 2 months, and follow up with me in 6 months. The patient completes chemotherapy in May.  -----------------------------------  Blair Promise, PhD, MD  This document serves as a record of services personally performed by Gery Pray, MD. It was created on his behalf by Maryla Morrow, a trained medical scribe. The creation of this record is based on the scribe's personal observations and the provider's statements to them. This document has been checked and approved by the attending provider.

## 2016-10-03 NOTE — Progress Notes (Signed)

## 2016-10-03 NOTE — Progress Notes (Signed)
REFERRING PROVIDER: [name], MD [address] [place], [place] [address]  PRIMARY PROVIDER:  Woody Seller, MD  PRIMARY REASON FOR VISIT:  Genetic Evaluation  HISTORY OF PRESENT ILLNESS:   Michelle Rosario, a 71 y.o. female, was seen for a [facility] cancer genetics consultation at the request of Dr. Redmond Pulling due to a personal and family history of cancer.  Michelle Rosario presents to clinic today to discuss the possibility of a hereditary predisposition to cancer, genetic testing, and to further clarify her future cancer risks, as well as potential cancer risks for family members.   In June 2017, at the age of 15, Michelle Rosario was diagnosed with endometrial cancer. This was treated with TAH/BSO. IHC performed on her tumor showed retained expression of the MMR genes (MLH1, MSH2, MSH6, PMS2). Michelle Rosario was diagnosed with vaginal recurrence of her endometrial cancer in November 2017. She underwent radiation treatment and is currently undergoing chemotherapy treatments.  CANCER HISTORY:    Recurrent carcinoma of endometrium ([place])   01/02/2016 Pathology Results    Uterus +/- tubes/ovaries, neoplastic, cervix ENDOMETRIAL ADENOCARCINOMA, FIGO GRADE 2 (4.7 CM) THE TUMOR INVADES LESS THAN ONE-HALF OF THE MYOMETRIUM (PT1A) ALL MARGINS OF RESECTION ARE NEGATIVE FOR CARCINOMA LEIOMYOMAS AND ADENOMYOSIS BILATERAL FALLOPIAN TUBES AND OVARIES: HISTOLOGICAL UNREMARKABLE 2. Lymph node, sentinel, biopsy, right obturator ONE BENIGN LYMPH NODE (0/1) 3. Lymph nodes, regional resection, left pelvic FOUR BENIGN LYMPH NODES (0/4)      01/02/2016 Surgery    Dr. Denman George performed robotic-assisted laparoscopic total hysterectomy with bilateral salpingoophorectomy, sentinel lymph node biopsy, lymphadenectomy        05/15/2016 Pathology Results    Vagina, biopsy, mid - ADENOCARCINOMA, SEE COMMENT. Microscopic Comment The morphology along with the patient's history are consistent with recurrent endometrioid  adenocarcinoma. The carcinoma has a similar appearance to the primary ([id number]).      05/20/2016 Imaging    Ct scan abdomen showed solid 2.5 cm peritoneal mass in the mid to anterior left pelvis, suspicious for peritoneal metastasis. 2. Small expansile low-attenuation filling defect in the left external iliac vein, cannot exclude a small deep venous thrombus. Consider correlation with left lower extremity venous Doppler scan. 3. Small simple fluid density structure in the left pelvic sidewall abutting the left external iliac vessels, favor a small postoperative seroma. 4. No ascites. 5. No lymphadenopathy.  No metastatic disease in the chest. 6. Aortic atherosclerosis.      06/12/2016 PET scan    Intensely hypermetabolic 2.1 cm central pelvic peritoneal mass just to the left of midline, consistent with peritoneal metastatic recurrence. No ascites. 2. No additional hypermetabolic sites of metastatic disease. 3. Diffuse thyroid hypermetabolism without discrete thyroid nodule, favoring thyroiditis. Recommend correlation with serum thyroid function tests.      06/24/2016 - 07/22/2016 Chemotherapy    The patient had weekly cisplatin. She has missed several doses due to infection and pancytopenia      06/24/2016 - 08/01/2016 Radiation Therapy    She completed concurrent radiation therapy Radiation treatment dates:   IMRT : 06/24/16 - 08/01/16 HDR : 08/13/16, 08/20/16, 08/27/16, 09/04/16  Site/dose:   Pelvis treated to 55 Gy in 25 fractions (simultaneous integrated boost technique) Vaginal Cuff treated to 24 Gy in 4 fractions      09/27/2016 Imaging    Interval decrease in size of previously described solid peritoneal nodule within the left anterior pelvis. Near complete resolution of previously described low-attenuation structure along the left pelvic sidewall. No evidence for metastatic disease  in the chest. Aortic atherosclerosis.       Past Medical History:  Diagnosis Date  . Broken ribs     hx of  . Family history of adverse reaction to anesthesia    mother post op N&V  . Hypertension   . Hypothyroidism   . Uterine cancer [facility])    May 30 , 2017    Past Surgical History:  Procedure Laterality Date  . achellis tendon repaired in 2005    . ANKLE SURGERY Right Oct 2006  . fisure  2014  . LYMPH NODE BIOPSY N/A 01/02/2016   Procedure: SENTINAL LYMPH NODE BIOPSY;  Surgeon: Everitt Amber, MD;  Location: [facility];  Service: Gynecology;  Laterality: N/A;  . rectal fissure surgery 2010    . REPLACEMENT TOTAL KNEE Left 2015  . ROBOTIC ASSISTED TOTAL HYSTERECTOMY WITH BILATERAL SALPINGO OOPHERECTOMY Bilateral 01/02/2016   Procedure: XI ROBOTIC ASSISTED TOTAL LAPAROSCOPIC HYSTERECTOMY WITH BILATERAL SALPINGO OOPHORECTOMY;  Surgeon: Everitt Amber, MD;  Location: [facility];  Service: Gynecology;  Laterality: Bilateral;  . TUBAL LIGATION      Social History   Social History  . Marital status: Divorced    Spouse name: N/A  . Number of children: 1  . Years of education: N/A   Occupational History  . retired Sales executive    Social History Main Topics  . Smoking status: Never Smoker  . Smokeless tobacco: Never Used  . Alcohol use Yes     Comment: socially  . Drug use: No  . Sexual activity: No   Other Topics Concern  . Not on file   Social History Narrative  . No narrative on file     FAMILY HISTORY:  We obtained a detailed, 4-generation family history.  Significant diagnoses are listed below: Family History  Problem Relation Age of Onset  . Anesthesia problems Mother   . Uterine cancer Mother 61  . Bladder Cancer Father 37  . Diabetes Sister   . Rectal cancer Cousin 29    d.69   Michelle Rosario has one son, age 46, without cancers. She has two full-sisters, age 9 and 3, without cancers. The older sister underwent TAH/BSO at age 59 due to fibroids.  Michelle Rosario mother was diagnosed with uterine cancer in her 46s and ultimately died of Alzheimer's disease at 73. Michelle Rosario  mother was one of 12 children. None of Michelle Rosario's maternal aunts or uncles have had cancers. All lived beyond age 63. One maternal first-cousin died of rectal cancer at 55.  Michelle Rosario father was diagnosed with bladder cancer in his early-80s and died of congestive heart failure at 34. Her father was also one of 12 children. One of his brothers is living at age 41 and has an unspecified form of cancer. One sister is also living and has no history of cancer. The rest of her father's siblings have died. They were all over age 59 without cancers. One paternal first-cousin, currently in his 26s, has an unspecified form of cancer.  There are no known cancers in any of Michelle Rosario's grandparents. Her paternal grandparents both lived to their late-90s. Her maternal grandparents were both over age 55.  Michelle Rosario is unaware of previous family history of genetic testing for hereditary cancer risks. Patient's maternal ancestors are of Caucasian descent, and paternal ancestors are of [place] descent. There is no reported Ashkenazi Jewish ancestry. There is no known consanguinity.  GENETIC COUNSELING ASSESSMENT: Michelle Rosario is a 71 y.o. female with a  personal and family history of endometrial/uterine, bladder, and rectal cancer. However, her family history as a whole is not suggestive of a hereditary cancer syndrome. We, therefore, discussed and recommended the following at today's visit.   DISCUSSION: We discussed with Michelle Rosario that the family history is not highly consistent with a familial hereditary cancer syndrome, and we feel she is at low risk to harbor a gene mutation associated with such a condition, especially given the normal IHC results of her endometrial cancer. Thus, we did not recommend any genetic testing, at this time, and recommended Michelle Rosario continue to follow the cancer screening guidelines given by her primary healthcare provider. Michelle Rosario expressed agreement with this plan. She was  encouraged to contact us with any updates to her family history or if she would like to pursue testing through self-pay options in the future.  PLAN: Because Michelle Rosario's family history is not strongly suggestive of inherited cancer syndromes, genetic testing was not recommended. She declined testing through self-pay options. Michelle Rosario was encouraged to remain in contact with cancer genetics annually so that we can continuously update the family history and inform her of any changes in cancer genetics and testing that may be of benefit for this family.   Ms.  Rosario questions were answered to her satisfaction today. Our contact information was provided should additional questions or concerns arise. Thank you for the referral and allowing [place] to share in the care of your patient.   Mal Misty, MS, [facility] Certified Naval [email or web address] phone: [phone]  The patient was seen for a total of 20 minutes in face-to-face genetic counseling.  This patient was discussed with Drs. Magrinat, Lindi Adie and/or Burr Medico who agrees with the above.    _______________________________________________________________________ For Office Staff:  Number of people involved in session: 1 Was an Intern/ student involved with case: no

## 2016-10-03 NOTE — Progress Notes (Signed)
Michelle Rosario is here for 1 month follow up from endometrial adenocarcinoma.  Patient denies any pain.  Patient states energy level has been fine until today.  Patient states appetite has been good until today.  Unsure of what is causing this.  Patient denies vaginal bleeding.  Denies blood in urine or stool.  Denies constipation/diarrhea.  Patient states that she has urine incontinence day of chemo and day after.  Denies abdominal pain.  Patient given a small + and medium dilator and was educated on importance and use.    Vitals:   10/03/16 1439  BP: 131/72  Pulse: 93  Resp: 18  Temp: 98.4 F (36.9 C)  TempSrc: Oral  SpO2: 98%  Weight: 210 lb 6.4 oz (95.4 kg)    Wt Readings from Last 3 Encounters:  10/03/16 210 lb 6.4 oz (95.4 kg)  09/30/16 210 lb 14.4 oz (95.7 kg)  09/09/16 213 lb 1.6 oz (96.7 kg)

## 2016-10-10 ENCOUNTER — Other Ambulatory Visit: Payer: Self-pay | Admitting: Hematology and Oncology

## 2016-10-16 ENCOUNTER — Telehealth: Payer: Self-pay | Admitting: *Deleted

## 2016-10-16 NOTE — Telephone Encounter (Signed)
Can she send a picture?

## 2016-10-16 NOTE — Telephone Encounter (Signed)
Pictures received.  Pt states she started Sulfamethoxazole TMP BID on Tuesday, has 10 tablets left.  Instructed to continue warm compresses, continue antibiotics, mark arm to see if area gets larger and call us on Friday with an update on how she is doing.

## 2016-10-16 NOTE — Telephone Encounter (Signed)
Pt reports a hard, red, tender knot above her right wrist. States she had chemo ~ 2 weeks ago. IV was in right hand. Has been using warm compress. Wants to know if she should be seen. States this has happened before with chemo.

## 2016-10-17 ENCOUNTER — Ambulatory Visit: Payer: Medicare Other

## 2016-10-17 ENCOUNTER — Other Ambulatory Visit: Payer: Medicare Other

## 2016-10-18 ENCOUNTER — Telehealth: Payer: Self-pay | Admitting: *Deleted

## 2016-10-18 ENCOUNTER — Ambulatory Visit (HOSPITAL_BASED_OUTPATIENT_CLINIC_OR_DEPARTMENT_OTHER): Payer: Medicare Other | Admitting: Nurse Practitioner

## 2016-10-18 ENCOUNTER — Encounter: Payer: Self-pay | Admitting: Nurse Practitioner

## 2016-10-18 VITALS — BP 132/71 | HR 79 | Temp 97.9°F | Resp 18 | Ht 66.0 in | Wt 216.4 lb

## 2016-10-18 DIAGNOSIS — L03113 Cellulitis of right upper limb: Secondary | ICD-10-CM

## 2016-10-18 DIAGNOSIS — C541 Malignant neoplasm of endometrium: Secondary | ICD-10-CM | POA: Diagnosis present

## 2016-10-18 MED ORDER — SULFAMETHOXAZOLE-TRIMETHOPRIM 800-160 MG PO TABS: 1.0000 | ORAL_TABLET | Freq: Two times a day (BID) | ORAL | 0 refills | Status: DC

## 2016-10-18 NOTE — Assessment & Plan Note (Signed)
Patient received cycle 3 of her Taxol/carboplatin chemotherapy regimen on 09/30/2016.  She is scheduled to return on 10/21/2016 for her next appointment.

## 2016-10-18 NOTE — Assessment & Plan Note (Addendum)
Patient presents to the [facility] today with a 3 to four-day history of a knot to her right forearm.  She states that she received peripheral chemotherapy on 09/30/2016 to her right hand.  She states that the site is tender; but continues with full range of motion with her hand.  She also denies any recent fevers or chills.  Of note-patient states that she has experienced these lesions to her bilateral arms several different times after receiving peripheral chemotherapy in the past.  She has taken antibiotics in the past for possible cellulitis to the sites.  She states check.  She started taking Bactrim antibiotics that she had leftover yesterday.  On exam today.  Patient does have an approximately 1.5 cm in diameter raised, mildly red/pink lesion to her right forearm.  There is no surrounding edema and no red streaks.  The area is only mildly tender with palpation.  It is highly possible that this area to the right forearm is actually phlebitis; but will prescribe additional Bactrim antibiotics in case this is cellulitic in nature.  Patient was advised to call/return or go directly to the emergency department if we can with any worsening symptoms whatsoever.

## 2016-10-18 NOTE — Progress Notes (Signed)
SYMPTOM MANAGEMENT CLINIC    Chief Complaint: Arm lesion  HPI:  Michelle Rosario 71 y.o. female diagnosed with endometrial cancer.  Currently undergoing Taxol/carboplatin chemotherapy regimen.     Recurrent carcinoma of endometrium ([place])   01/02/2016 Pathology Results    Uterus +/- tubes/ovaries, neoplastic, cervix ENDOMETRIAL ADENOCARCINOMA, FIGO GRADE 2 (4.7 CM) THE TUMOR INVADES LESS THAN ONE-HALF OF THE MYOMETRIUM (PT1A) ALL MARGINS OF RESECTION ARE NEGATIVE FOR CARCINOMA LEIOMYOMAS AND ADENOMYOSIS BILATERAL FALLOPIAN TUBES AND OVARIES: HISTOLOGICAL UNREMARKABLE 2. Lymph node, sentinel, biopsy, right obturator ONE BENIGN LYMPH NODE (0/1) 3. Lymph nodes, regional resection, left pelvic FOUR BENIGN LYMPH NODES (0/4)      01/02/2016 Surgery    Dr. Denman George performed robotic-assisted laparoscopic total hysterectomy with bilateral salpingoophorectomy, sentinel lymph node biopsy, lymphadenectomy        05/15/2016 Pathology Results    Vagina, biopsy, mid - ADENOCARCINOMA, SEE COMMENT. Microscopic Comment The morphology along with the patient's history are consistent with recurrent endometrioid adenocarcinoma. The carcinoma has a similar appearance to the primary ([id number]).      05/20/2016 Imaging    Ct scan abdomen showed solid 2.5 cm peritoneal mass in the mid to anterior left pelvis, suspicious for peritoneal metastasis. 2. Small expansile low-attenuation filling defect in the left external iliac vein, cannot exclude a small deep venous thrombus. Consider correlation with left lower extremity venous Doppler scan. 3. Small simple fluid density structure in the left pelvic sidewall abutting the left external iliac vessels, favor a small postoperative seroma. 4. No ascites. 5. No lymphadenopathy.  No metastatic disease in the chest. 6. Aortic atherosclerosis.      06/12/2016 PET scan    Intensely hypermetabolic 2.1 cm central pelvic peritoneal mass just to the left of midline,  consistent with peritoneal metastatic recurrence. No ascites. 2. No additional hypermetabolic sites of metastatic disease. 3. Diffuse thyroid hypermetabolism without discrete thyroid nodule, favoring thyroiditis. Recommend correlation with serum thyroid function tests.      06/24/2016 - 07/22/2016 Chemotherapy    The patient had weekly cisplatin. She has missed several doses due to infection and pancytopenia      06/24/2016 - 08/01/2016 Radiation Therapy    She completed concurrent radiation therapy Radiation treatment dates:   IMRT : 06/24/16 - 08/01/16 HDR : 08/13/16, 08/20/16, 08/27/16, 09/04/16  Site/dose:   Pelvis treated to 55 Gy in 25 fractions (simultaneous integrated boost technique) Vaginal Cuff treated to 24 Gy in 4 fractions      09/27/2016 Imaging    Interval decrease in size of previously described solid peritoneal nodule within the left anterior pelvis. Near complete resolution of previously described low-attenuation structure along the left pelvic sidewall. No evidence for metastatic disease in the chest. Aortic atherosclerosis.       Review of Systems  Skin:       Lesion to right forearm  All other systems reviewed and are negative.   Past Medical History:  Diagnosis Date  . Broken ribs    hx of  . Family history of adverse reaction to anesthesia    mother post op N&V  . Hypertension   . Hypothyroidism   . Uterine cancer [facility])    May 30 , 2017    Past Surgical History:  Procedure Laterality Date  . achellis tendon repaired in 2005    . ANKLE SURGERY Right Oct 2006  . fisure  2014  . LYMPH NODE BIOPSY N/A 01/02/2016   Procedure: SENTINAL LYMPH NODE BIOPSY;  Surgeon: Everitt Amber,  MD;  Location: [facility];  Service: Gynecology;  Laterality: N/A;  . rectal fissure surgery 2010    . REPLACEMENT TOTAL KNEE Left 2015  . ROBOTIC ASSISTED TOTAL HYSTERECTOMY WITH BILATERAL SALPINGO OOPHERECTOMY Bilateral 01/02/2016   Procedure: XI ROBOTIC ASSISTED TOTAL LAPAROSCOPIC  HYSTERECTOMY WITH BILATERAL SALPINGO OOPHORECTOMY;  Surgeon: Everitt Amber, MD;  Location: [facility];  Service: Gynecology;  Laterality: Bilateral;  . TUBAL LIGATION      has Recurrent carcinoma of endometrium ([place]); Thoracic aorta atherosclerosis ([place]); Hypothyroidism; Essential hypertension; Pancytopenia, acquired ([place]); IV infiltration; Cellulitis of right upper extremity; Superficial thrombophlebitis of cephalic vein; Restless leg syndrome; Deficiency anemia; Peripheral neuropathy due to chemotherapy ([place]); Anemia due to antineoplastic chemotherapy; and Urinary incontinence, overflow on her problem list.    is allergic to latex.  Allergies as of 10/18/2016      Reactions   Latex Itching      Medication List       Accurate as of 10/18/16  5:27 PM. Always use your most recent med list.          ALPRAZolam 1 MG tablet Commonly known as:  XANAX Take 1/2 to one tablet at bedtime as needed for insomnia and restless legs.   CALTRATE COLON HEALTH PO Take 1 tablet by mouth daily.   chlorhexidine 0.12 % solution Commonly known as:  PERIDEX Use as directed 15 mLs in the mouth or throat 2 (two) times daily.   clonazePAM 0.5 MG tablet Commonly known as:  KLONOPIN Take 0.5 mg by mouth daily as needed (restless legs).   dexamethasone 4 MG tablet Commonly known as:  DECADRON Take 5 tablets with food 12 hrs and 6 hrs prior to Taxol chemotherapy.   famotidine 20 MG tablet Commonly known as:  PEPCID Take 1 tablet (20 mg total) by mouth 2 (two) times daily as needed for heartburn or indigestion.   fluticasone 50 MCG/ACT nasal spray Commonly known as:  FLONASE Place 1 spray into both nostrils 2 (two) times daily as needed.   gabapentin 600 MG tablet Commonly known as:  NEURONTIN TAKE 1 TABLET BY MOUTH TWICE A DAY   levothyroxine 88 MCG tablet Commonly known as:  SYNTHROID, LEVOTHROID Take 88 mcg by mouth daily.   loperamide 2 MG capsule Commonly known as:  IMODIUM Take by mouth as  needed for diarrhea or loose stools.   LORazepam 1 MG tablet Commonly known as:  ATIVAN Place 1 tablet (1 mg total) under the tongue every 8 (eight) hours as needed (nausea).   magnesium 30 MG tablet Take 30 mg by mouth daily.   Melatonin 3 MG Tbdp Take 3 mg by mouth at bedtime.   morphine CONCENTRATE 10 MG/0.5ML Soln concentrated solution Take 0.5 mLs (10 mg total) by mouth every 2 (two) hours as needed for severe pain.   multivitamin with minerals Tabs tablet Take 1 tablet by mouth daily.   Naproxen Sodium 220 MG Caps Take 1 capsule (220 mg total) by mouth 2 (two) times daily as needed (pain).   ondansetron 8 MG tablet Commonly known as:  ZOFRAN Take 1 tablet (8 mg total) by mouth every 8 (eight) hours as needed for nausea.   oxyCODONE 5 MG immediate release tablet Commonly known as:  Oxy IR/ROXICODONE Take 1 tablet (5 mg total) by mouth every 4 (four) hours as needed for severe pain.   POTASSIMIN 75 MG Tabs Generic drug:  Potassium Take 75 mg by mouth daily.   prochlorperazine 10 MG tablet Commonly known as:  COMPAZINE Take 1 tablet (10 mg total) by mouth every 6 (six) hours as needed for nausea.   protein supplement shake Liqd Commonly known as:  PREMIER PROTEIN Take 325 mLs (11 oz total) by mouth 2 (two) times daily between meals.   sulfamethoxazole-trimethoprim 800-160 MG tablet Commonly known as:  BACTRIM DS,SEPTRA DS Take 1 tablet by mouth 2 (two) times daily.   traMADol 50 MG tablet Commonly known as:  ULTRAM   traZODone 50 MG tablet Commonly known as:  DESYREL   Vitamin B Complex Tabs Take 1 tablet by mouth daily.        PHYSICAL EXAMINATION  Oncology Vitals 10/18/2016 10/03/2016  Height 168 cm -  Weight 98.158 kg 95.437 kg  Weight (lbs) 216 lbs 6 oz 210 lbs 6 oz  BMI (kg/m2) 34.93 kg/m2 33.96 kg/m2  Temp 97.9 98.4  Pulse 79 93  Resp 18 18  SpO2 98 98  BSA (m2) 2.14 m2 2.11 m2   BP Readings from Last 2 Encounters:  10/18/16 132/71    10/03/16 131/72    Physical Exam  Constitutional: She is oriented to person, place, and time and well-developed, well-nourished, and in no distress.  HENT:  Head: Normocephalic and atraumatic.  Eyes: Conjunctivae and EOM are normal. Pupils are equal, round, and reactive to light.  Neck: Normal range of motion.  Pulmonary/Chest: Effort normal. No respiratory distress.  Musculoskeletal: Normal range of motion. She exhibits edema and tenderness.  Neurological: She is alert and oriented to person, place, and time. Gait normal.  Skin: Skin is warm and dry. There is erythema.  On exam today.  Patient does have an approximately 1.5 cm in diameter raised, mildly red/pink lesion to her right forearm.  There is no surrounding edema and no red streaks.  The area is only mildly tender with palpation.  I  Psychiatric: Affect normal.  Nursing note and vitals reviewed.   LABORATORY DATA:. No visits with results within 3 Day(s) from this visit.  Latest known visit with results is:  Appointment on 09/30/2016  Component Date Value Ref Range Status  . WBC 09/30/2016 5.8  3.9 - 10.3 10e3/uL Final  . NEUT# 09/30/2016 5.3  1.5 - 6.5 10e3/uL Final  . HGB 09/30/2016 10.9* 11.6 - 15.9 g/dL Final  . HCT 09/30/2016 31.8* 34.8 - 46.6 % Final  . Platelets 09/30/2016 228  145 - 400 10e3/uL Final  . MCV 09/30/2016 87.1  79.5 - 101.0 fL Final  . MCH 09/30/2016 29.9  25.1 - 34.0 pg Final  . MCHC 09/30/2016 34.3  31.5 - 36.0 g/dL Final  . RBC 09/30/2016 3.65* 3.70 - 5.45 10e6/uL Final  . RDW 09/30/2016 15.9* 11.2 - 14.5 % Final  . lymph# 09/30/2016 0.6* 0.9 - 3.3 10e3/uL Final  . MONO# 09/30/2016 0.0* 0.1 - 0.9 10e3/uL Final  . Eosinophils Absolute 09/30/2016 0.0  0.0 - 0.5 10e3/uL Final  . Basophils Absolute 09/30/2016 0.0  0.0 - 0.1 10e3/uL Final  . NEUT% 09/30/2016 90.1* 38.4 - 76.8 % Final  . LYMPH% 09/30/2016 9.4* 14.0 - 49.7 % Final  . MONO% 09/30/2016 0.5  0.0 - 14.0 % Final  . EOS% 09/30/2016 0.0   0.0 - 7.0 % Final  . BASO% 09/30/2016 0.0  0.0 - 2.0 % Final  . Sodium 09/30/2016 139  136 - 145 mEq/L Final  . Potassium 09/30/2016 3.9  3.5 - 5.1 mEq/L Final  . Chloride 09/30/2016 106  98 - 109 mEq/L Final  . CO2 09/30/2016 21*  22 - 29 mEq/L Final  . Glucose 09/30/2016 228* 70 - 140 mg/dl Final  . BUN 09/30/2016 18.8  7.0 - 26.0 mg/dL Final  . Creatinine 09/30/2016 0.8  0.6 - 1.1 mg/dL Final  . Total Bilirubin 09/30/2016 0.32  0.20 - 1.20 mg/dL Final  . Alkaline Phosphatase 09/30/2016 139  40 - 150 U/L Final  . AST 09/30/2016 14  5 - 34 U/L Final  . ALT 09/30/2016 15  0 - 55 U/L Final  . Total Protein 09/30/2016 7.4  6.4 - 8.3 g/dL Final  . Albumin 09/30/2016 3.8  3.5 - 5.0 g/dL Final  . Calcium 09/30/2016 10.2  8.4 - 10.4 mg/dL Final  . Anion Gap 09/30/2016 12* 3 - 11 mEq/L Final  . EGFR 09/30/2016 74* >90 ml/min/1.73 m2 Final  . Ferritin 09/30/2016 292* 9 - 269 ng/ml Final  . Iron 09/30/2016 64  41 - 142 ug/dL Final  . TIBC 09/30/2016 303  236 - 444 ug/dL Final  . UIBC 09/30/2016 239  120 - 384 ug/dL Final  . %SAT 09/30/2016 21  21 - 57 % Final    RADIOGRAPHIC STUDIES: No results found.  ASSESSMENT/PLAN:    Recurrent carcinoma of endometrium [facility]) Patient received cycle 3 of her Taxol/carboplatin chemotherapy regimen on 09/30/2016.  She is scheduled to return on 10/21/2016 for her next appointment.  Cellulitis of right upper extremity Patient presents to the [place] today with a 3 to four-day history of a knot to her right forearm.  She states that she received peripheral chemotherapy on 09/30/2016 to her right hand.  She states that the site is tender; but continues with full range of motion with her hand.  She also denies any recent fevers or chills.  Of note-patient states that she has experienced these lesions to her bilateral arms several different times after receiving peripheral chemotherapy in the past.  She has taken antibiotics in the past for possible  cellulitis to the sites.  She states check.  She started taking Bactrim antibiotics that she had leftover yesterday.  On exam today.  Patient does have an approximately 1.5 cm in diameter raised, mildly red/pink lesion to her right forearm.  There is no surrounding edema and no red streaks.  The area is only mildly tender with palpation.  It is highly possible that this area to the right forearm is actually phlebitis; but will prescribe additional Bactrim antibiotics in case this is cellulitic in nature.  Patient was advised to call/return or go directly to the emergency department if we can with any worsening symptoms whatsoever.   Patient stated understanding of all instructions; and was in agreement with this plan of care. The patient knows to call the clinic with any problems, questions or concerns.   Total time spent with patient was 25 minutes;  with greater than 75 percent of that time spent in face to face counseling regarding patient's symptoms,  and coordination of care and follow up.  Disclaimer:This dictation was prepared with Dragon/digital dictation along with [employer]. Any transcriptional errors that result from this process are unintentional.  Drue Second, NP 10/18/2016

## 2016-10-18 NOTE — Telephone Encounter (Signed)
Pt states knot on arm is slightly more raised and red. Will come in to see NP

## 2016-10-21 ENCOUNTER — Other Ambulatory Visit (HOSPITAL_BASED_OUTPATIENT_CLINIC_OR_DEPARTMENT_OTHER): Payer: Medicare Other

## 2016-10-21 ENCOUNTER — Encounter: Payer: Self-pay | Admitting: Hematology and Oncology

## 2016-10-21 ENCOUNTER — Ambulatory Visit (HOSPITAL_BASED_OUTPATIENT_CLINIC_OR_DEPARTMENT_OTHER): Payer: Medicare Other

## 2016-10-21 ENCOUNTER — Ambulatory Visit (HOSPITAL_BASED_OUTPATIENT_CLINIC_OR_DEPARTMENT_OTHER): Payer: Medicare Other | Admitting: Hematology and Oncology

## 2016-10-21 DIAGNOSIS — C541 Malignant neoplasm of endometrium: Secondary | ICD-10-CM | POA: Diagnosis present

## 2016-10-21 DIAGNOSIS — G620 Drug-induced polyneuropathy: Secondary | ICD-10-CM

## 2016-10-21 DIAGNOSIS — Z5189 Encounter for other specified aftercare: Secondary | ICD-10-CM

## 2016-10-21 DIAGNOSIS — Z5111 Encounter for antineoplastic chemotherapy: Secondary | ICD-10-CM

## 2016-10-21 DIAGNOSIS — D6481 Anemia due to antineoplastic chemotherapy: Secondary | ICD-10-CM | POA: Diagnosis not present

## 2016-10-21 DIAGNOSIS — L03113 Cellulitis of right upper limb: Secondary | ICD-10-CM

## 2016-10-21 DIAGNOSIS — T451X5A Adverse effect of antineoplastic and immunosuppressive drugs, initial encounter: Secondary | ICD-10-CM

## 2016-10-21 LAB — CBC WITH DIFFERENTIAL/PLATELET
BASO%: 0 % (ref 0.0–2.0)
BASOS ABS: 0 10*3/uL (ref 0.0–0.1)
EOS%: 0 % (ref 0.0–7.0)
Eosinophils Absolute: 0 10*3/uL (ref 0.0–0.5)
HCT: 30.6 % — ABNORMAL LOW (ref 34.8–46.6)
HEMOGLOBIN: 10.6 g/dL — AB (ref 11.6–15.9)
LYMPH#: 0.6 10*3/uL — AB (ref 0.9–3.3)
LYMPH%: 8.8 % — ABNORMAL LOW (ref 14.0–49.7)
MCH: 30.5 pg (ref 25.1–34.0)
MCHC: 34.6 g/dL (ref 31.5–36.0)
MCV: 87.9 fL (ref 79.5–101.0)
MONO#: 0.1 10*3/uL (ref 0.1–0.9)
MONO%: 0.8 % (ref 0.0–14.0)
NEUT#: 5.6 10*3/uL (ref 1.5–6.5)
NEUT%: 90.4 % — ABNORMAL HIGH (ref 38.4–76.8)
Platelets: 188 10*3/uL (ref 145–400)
RBC: 3.48 10*6/uL — AB (ref 3.70–5.45)
RDW: 15.4 % — AB (ref 11.2–14.5)
WBC: 6.2 10*3/uL (ref 3.9–10.3)

## 2016-10-21 LAB — COMPREHENSIVE METABOLIC PANEL
ALT: 35 U/L (ref 0–55)
AST: 34 U/L (ref 5–34)
Albumin: 3.8 g/dL (ref 3.5–5.0)
Alkaline Phosphatase: 130 U/L (ref 40–150)
Anion Gap: 12 mEq/L — ABNORMAL HIGH (ref 3–11)
BILIRUBIN TOTAL: 0.39 mg/dL (ref 0.20–1.20)
BUN: 21.4 mg/dL (ref 7.0–26.0)
CHLORIDE: 104 meq/L (ref 98–109)
CO2: 21 meq/L — AB (ref 22–29)
Calcium: 10.3 mg/dL (ref 8.4–10.4)
Creatinine: 1 mg/dL (ref 0.6–1.1)
EGFR: 60 mL/min/{1.73_m2} — AB (ref >90)
GLUCOSE: 185 mg/dL — AB (ref 70–140)
Potassium: 4.4 mEq/L (ref 3.5–5.1)
SODIUM: 138 meq/L (ref 136–145)
TOTAL PROTEIN: 7.2 g/dL (ref 6.4–8.3)

## 2016-10-21 MED ORDER — CARBOPLATIN CHEMO INJECTION 600 MG/60ML
640.8000 mg | Freq: Once | INTRAVENOUS | Status: AC
  Administered 2016-10-21: 640 mg via INTRAVENOUS
  Filled 2016-10-21: qty 64

## 2016-10-21 MED ORDER — SODIUM CHLORIDE 0.9 % IV SOLN
Freq: Once | INTRAVENOUS | Status: AC
  Administered 2016-10-21: 12:00:00 via INTRAVENOUS
  Filled 2016-10-21: qty 5

## 2016-10-21 MED ORDER — DIPHENHYDRAMINE HCL 50 MG/ML IJ SOLN
50.0000 mg | Freq: Once | INTRAMUSCULAR | Status: AC
  Administered 2016-10-21: 50 mg via INTRAVENOUS

## 2016-10-21 MED ORDER — PALONOSETRON HCL INJECTION 0.25 MG/5ML
0.2500 mg | Freq: Once | INTRAVENOUS | Status: AC
Start: 2016-10-21 — End: 2016-10-21
  Administered 2016-10-21: 0.25 mg via INTRAVENOUS

## 2016-10-21 MED ORDER — DIPHENHYDRAMINE HCL 50 MG/ML IJ SOLN
INTRAMUSCULAR | Status: AC
  Filled 2016-10-21: qty 1

## 2016-10-21 MED ORDER — FAMOTIDINE IN NACL 20-0.9 MG/50ML-% IV SOLN
20.0000 mg | Freq: Two times a day (BID) | INTRAVENOUS | Status: DC
  Administered 2016-10-21: 20 mg via INTRAVENOUS

## 2016-10-21 MED ORDER — PACLITAXEL CHEMO INJECTION 300 MG/50ML
131.2500 mg/m2 | Freq: Once | INTRAVENOUS | Status: AC
  Administered 2016-10-21: 276 mg via INTRAVENOUS
  Filled 2016-10-21: qty 46

## 2016-10-21 MED ORDER — PEGFILGRASTIM 6 MG/0.6ML ~~LOC~~ PSKT
6.0000 mg | PREFILLED_SYRINGE | Freq: Once | SUBCUTANEOUS | Status: AC
  Administered 2016-10-21: 6 mg via SUBCUTANEOUS
  Filled 2016-10-21: qty 0.6

## 2016-10-21 MED ORDER — PALONOSETRON HCL INJECTION 0.25 MG/5ML
INTRAVENOUS | Status: AC
  Filled 2016-10-21: qty 5

## 2016-10-21 MED ORDER — FAMOTIDINE IN NACL 20-0.9 MG/50ML-% IV SOLN
INTRAVENOUS | Status: AC
  Filled 2016-10-21: qty 50

## 2016-10-21 MED ORDER — SODIUM CHLORIDE 0.9 % IV SOLN
Freq: Once | INTRAVENOUS | Status: AC
  Administered 2016-10-21: 12:00:00 via INTRAVENOUS

## 2016-10-21 NOTE — Progress Notes (Signed)
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 Lymph node, sentinel, biopsy, right obturator ONE BENIGN LYMPH NODE (0/1) 3. Lymph nodes, regional resection, left pelvic FOUR BENIGN LYMPH NODES (0/4)      01/02/2016 Surgery    Dr. Denman George performed robotic-assisted laparoscopic total hysterectomy with bilateral salpingoophorectomy, sentinel lymph node biopsy, lymphadenectomy        05/15/2016 Pathology Results    Vagina, biopsy, mid - ADENOCARCINOMA, SEE COMMENT. Microscopic Comment The morphology along with the patient's history are consistent with recurrent endometrioid adenocarcinoma. The carcinoma has a similar appearance to the primary ([id number]).      05/20/2016 Imaging    Ct scan abdomen showed solid 2.5 cm peritoneal mass in the mid to anterior left pelvis, suspicious for peritoneal metastasis. 2. Small expansile low-attenuation filling defect in the left external iliac vein, cannot exclude a small deep venous thrombus. Consider correlation with left lower extremity venous Doppler scan. 3. Small simple fluid density structure in the left pelvic sidewall abutting the left external iliac vessels, favor a small postoperative seroma. 4. No ascites. 5. No lymphadenopathy.  No metastatic disease in the chest. 6. Aortic atherosclerosis.      06/12/2016 PET scan    Intensely hypermetabolic 2.1 cm central pelvic peritoneal mass just to the left of midline, consistent with peritoneal metastatic recurrence.  No ascites. 2. No additional hypermetabolic sites of metastatic disease. 3. Diffuse thyroid hypermetabolism without discrete thyroid nodule, favoring thyroiditis. Recommend correlation with serum thyroid function tests.      06/24/2016 - 07/22/2016 Chemotherapy    The patient had weekly cisplatin. She has missed several doses due to infection and pancytopenia      06/24/2016 - 08/01/2016 Radiation Therapy    She completed concurrent radiation therapy Radiation treatment dates:   IMRT : 06/24/16 - 08/01/16 HDR : 08/13/16, 08/20/16, 08/27/16, 09/04/16  Site/dose:   Pelvis treated to 55 Gy in 25 fractions (simultaneous integrated boost technique) Vaginal Cuff treated to 24 Gy in 4 fractions      09/27/2016 Imaging    Interval decrease in size of previously described solid peritoneal nodule within the left anterior pelvis. Near complete resolution of previously described low-attenuation structure along the left pelvic sidewall. No evidence for metastatic disease in the chest. Aortic atherosclerosis.       INTERVAL HISTORY: Please see below for problem oriented charting. She is seen prior to cycle 4 of treatment. She feels well. The infiltrate/phlebitis of the right hand had resolved. She denies worsening peripheral neuropathy. No recent nausea or vomiting.  REVIEW OF SYSTEMS:   Constitutional: Denies fevers, chills or abnormal weight loss Eyes: Denies blurriness of vision Ears, nose, mouth, throat, and face: Denies mucositis or sore throat Respiratory: Denies cough, dyspnea or wheezes Cardiovascular: Denies palpitation, chest discomfort or lower extremity swelling Gastrointestinal:  Denies nausea, heartburn or change in bowel habits Skin: Denies abnormal skin rashes Lymphatics: Denies new lymphadenopathy or easy bruising Neurological:Denies numbness, tingling or new weaknesses Behavioral/Psych: Mood is stable, no new changes  All other systems were reviewed with the patient and are  negative.  I have reviewed the past medical history, past surgical history, social history and family history with the patient and they are unchanged from previous note.  ALLERGIES:  is allergic to latex.  MEDICATIONS:  Current Outpatient Prescriptions  Medication Sig Dispense Refill  . B Complex Vitamins (VITAMIN B COMPLEX) TABS Take 1 tablet by mouth daily.     . Calcium Carbonate-Vitamin D (CALTRATE COLON HEALTH PO) Take 1 tablet by mouth daily.    . chlorhexidine (PERIDEX) 0.12 % solution Use as directed 15 mLs in the mouth or throat 2 (two) times daily. 120 mL 0  . clonazePAM (KLONOPIN) 0.5 MG tablet Take 0.5 mg by mouth daily as needed (restless legs).    Marland Kitchen dexamethasone (DECADRON) 4 MG tablet Take 5 tablets with food 12 hrs and 6 hrs prior to Taxol chemotherapy. 10 tablet 6  . fluticasone (FLONASE) 50 MCG/ACT nasal spray Place 1 spray into both nostrils 2 (two) times daily as needed.  1  . gabapentin (NEURONTIN) 600 MG tablet TAKE 1 TABLET BY MOUTH TWICE A DAY 60 tablet 9  . levothyroxine (SYNTHROID, LEVOTHROID) 88 MCG tablet Take 88 mcg by mouth daily.  3  . magnesium 30 MG tablet Take 30 mg by mouth daily.     . Melatonin 3 MG TBDP Take 3 mg by mouth at bedtime.     . Multiple Vitamin (MULTIVITAMIN WITH MINERALS) TABS tablet Take 1 tablet by mouth daily.    . Naproxen Sodium 220 MG CAPS Take 1 capsule (220 mg total) by mouth 2 (two) times daily as needed (pain). 60 each 0  . Potassium (POTASSIMIN) 75 MG TABS Take 75 mg by mouth daily.    . protein supplement shake (PREMIER PROTEIN) LIQD Take 325 mLs (11 oz total) by mouth 2 (two) times daily between meals. 14 Can 0  . sulfamethoxazole-trimethoprim (BACTRIM DS,SEPTRA DS) 800-160 MG tablet Take 1 tablet by mouth 2 (two) times daily. 8 tablet 0  . ALPRAZolam (XANAX) 1 MG tablet Take 1/2 to one tablet at bedtime as needed for insomnia and restless legs.    . famotidine (PEPCID) 20 MG tablet Take 1 tablet (20 mg total) by mouth 2 (two)  times daily as needed for heartburn or indigestion. (Patient not taking: Reported on 09/09/2016) 30 tablet 0  . loperamide (IMODIUM) 2 MG capsule Take by mouth as needed for diarrhea or loose stools.    Marland Kitchen LORazepam (ATIVAN) 1 MG tablet Place 1 tablet (1 mg total) under the tongue every 8 (eight) hours as needed (nausea). (Patient not taking: Reported on 09/09/2016) 30 tablet 0  . Morphine Sulfate (MORPHINE CONCENTRATE) 10 MG/0.5ML SOLN concentrated solution Take 0.5 mLs (10 mg total) by mouth every 2 (two) hours as needed for severe pain. (Patient not taking: Reported on 09/09/2016) 15 mL 0  . ondansetron (ZOFRAN) 8 MG tablet Take 1 tablet (8 mg total) by mouth every 8 (eight) hours as needed for nausea. (Patient not taking: Reported on 09/09/2016) 60 tablet 1  . oxyCODONE (OXY IR/ROXICODONE) 5 MG immediate release tablet Take 1 tablet (5 mg total) by mouth every 4 (four) hours as needed for severe pain. (Patient not taking: Reported on 09/09/2016) 10 tablet 0  . prochlorperazine (COMPAZINE) 10 MG tablet Take 1 tablet (10 mg total) by mouth every 6 (six) hours as needed for nausea. (Patient not taking: Reported on 09/09/2016) 20 tablet 0  . traMADol (ULTRAM) 50 MG tablet     . traZODone (DESYREL) 50 MG tablet  No current facility-administered medications for this visit.    Facility-Administered Medications Ordered in Other Visits  Medication Dose Route Frequency Provider Last Rate Last Dose  . CARBOplatin (PARAPLATIN) 640 mg in sodium chloride 0.9 % 250 mL chemo infusion  640 mg Intravenous Once Heath Lark, MD      . famotidine (PEPCID) IVPB 20 mg premix  20 mg Intravenous Q12H Keary [place], MD   20 mg at 10/21/16 1153  . PACLitaxel (TAXOL) 276 mg in dextrose 5 % 250 mL chemo infusion (> 44m/m2)  131.25 mg/m2 (Treatment Plan Recorded) Intravenous Once NHeath Lark MD 99 mL/hr at 10/21/16 1245 276 mg at 10/21/16 1245  . pegfilgrastim (NEULASTA ONPRO KIT) injection 6 mg  6 mg Subcutaneous Once NHeath Lark MD         PHYSICAL EXAMINATION: ECOG PERFORMANCE STATUS: 1 - Symptomatic but completely ambulatory  Vitals:   10/21/16 1011  BP: (!) 158/84  Pulse: (!) 105  Resp: 18  Temp: 98.5 F (36.9 C)   Filed Weights   10/21/16 1011  Weight: 216 lb 11.2 oz (98.3 kg)    GENERAL:alert, no distress and comfortable SKIN: skin color, texture, turgor are normal, no rashes or significant lesions EYES: normal, Conjunctiva are pink and non-injected, sclera clear OROPHARYNX:no exudate, no erythema and lips, buccal mucosa, and tongue normal  NECK: supple, thyroid normal size, non-tender, without nodularity LYMPH:  no palpable lymphadenopathy in the cervical, axillary or inguinal LUNGS: clear to auscultation and percussion with normal breathing effort HEART: regular rate & rhythm and no murmurs and no lower extremity edema ABDOMEN:abdomen soft, non-tender and normal bowel sounds Musculoskeletal:no cyanosis of digits and no clubbing  NEURO: alert & oriented x 3 with fluent speech, no focal motor/sensory deficits  LABORATORY DATA:  I have reviewed the data as listed    Component Value Date/Time   NA 138 10/21/2016 0951   K 4.4 10/21/2016 0951   CL 106 08/30/2016 0527   CO2 21 (L) 10/21/2016 0951   GLUCOSE 185 (H) 10/21/2016 0951   BUN 21.4 10/21/2016 0951   CREATININE 1.0 10/21/2016 0951   CALCIUM 10.3 10/21/2016 0951   PROT 7.2 10/21/2016 0951   ALBUMIN 3.8 10/21/2016 0951   AST 34 10/21/2016 0951   ALT 35 10/21/2016 0951   ALKPHOS 130 10/21/2016 0951   BILITOT 0.39 10/21/2016 0951   GFRNONAA >60 08/31/2016 0539   GFRAA >60 08/31/2016 0539    No results found for: SPEP, UPEP  Lab Results  Component Value Date   WBC 6.2 10/21/2016   NEUTROABS 5.6 10/21/2016   HGB 10.6 (L) 10/21/2016   HCT 30.6 (L) 10/21/2016   MCV 87.9 10/21/2016   PLT 188 10/21/2016      Chemistry      Component Value Date/Time   NA 138 10/21/2016 0951   K 4.4 10/21/2016 0951   CL 106 08/30/2016 0527   CO2 21  (L) 10/21/2016 0951   BUN 21.4 10/21/2016 0951   CREATININE 1.0 10/21/2016 0951      Component Value Date/Time   CALCIUM 10.3 10/21/2016 0951   ALKPHOS 130 10/21/2016 0951   AST 34 10/21/2016 0951   ALT 35 10/21/2016 0951   BILITOT 0.39 10/21/2016 0951       RADIOGRAPHIC STUDIES: I have personally reviewed the radiological images as listed and agreed with the findings in the report. Ct Chest W Contrast  Result Date: 09/27/2016 CLINICAL DATA:  Patient with history of endometrial carcinoma. Assess treatment response. EXAM: CT CHEST,  ABDOMEN, AND PELVIS WITH CONTRAST TECHNIQUE: Multidetector CT imaging of the chest, abdomen and pelvis was performed following the standard protocol during bolus administration of intravenous contrast. CONTRAST:  100 ISOVUE-300 IOPAMIDOL (ISOVUE-300) INJECTION 61% COMPARISON:  PET-CT 06/12/2016; CT chest abdomen pelvis 05/20/2016 FINDINGS: CT CHEST FINDINGS Cardiovascular: Normal heart size. No pericardial effusion. Aorta and main pulmonary artery normal in caliber. Mediastinum/Nodes: No enlarged axillary, mediastinal or hilar lymphadenopathy. Lungs/Pleura: Central airways are patent. Dependent atelectasis bilateral lower lobes and lingula. Unchanged 3 mm right upper lobe pulmonary nodule (image 35; series 6), stable over time, compatible with benign etiology. No large area of pulmonary consolidation. No pleural effusion or pneumothorax. Musculoskeletal: No aggressive or acute appearing osseous lesions. CT ABDOMEN PELVIS FINDINGS Hepatobiliary: Stable subcentimeter low-attenuation lesion within the hepatic dome (image 47; series 2). No new or enlarging hepatic lesions are identified. Gallbladder is decompressed. No intrahepatic or extrahepatic biliary ductal dilatation. Pancreas: Unremarkable Spleen: Unremarkable Adrenals/Urinary Tract: The adrenal glands are normal. Kidneys enhance symmetrically with contrast. No hydronephrosis. Urinary bladder is unremarkable.  Stomach/Bowel: Normal morphology of the stomach. No abnormal bowel wall thickening or evidence for bowel obstruction. No free fluid or free intraperitoneal air. Normal appendix. Vascular/Lymphatic: Normal caliber abdominal aorta. No retroperitoneal lymphadenopathy. Reproductive: Patient status post hysterectomy. Interval decrease in size of previously described solid nodule within the anterior left pelvis measuring 1.3 x 0.9 cm (image 102; series 2), previously 2.5 x 2.1 cm. Interval decrease and near complete resolution of previously described adjacent low-attenuation structure along the pelvic side wall. Other: None Musculoskeletal: Lumbar spine degenerative changes. No aggressive or acute appearing osseous lesions. IMPRESSION: Interval decrease in size of previously described solid peritoneal nodule within the left anterior pelvis. Near complete resolution of previously described low-attenuation structure along the left pelvic sidewall. No evidence for metastatic disease in the chest. Aortic atherosclerosis. Electronically Signed   By: Lovey Newcomer M.D.   On: 09/27/2016 14:37   Ct Abdomen Pelvis W Contrast  Result Date: 09/27/2016 CLINICAL DATA:  Patient with history of endometrial carcinoma. Assess treatment response. EXAM: CT CHEST, ABDOMEN, AND PELVIS WITH CONTRAST TECHNIQUE: Multidetector CT imaging of the chest, abdomen and pelvis was performed following the standard protocol during bolus administration of intravenous contrast. CONTRAST:  100 ISOVUE-300 IOPAMIDOL (ISOVUE-300) INJECTION 61% COMPARISON:  PET-CT 06/12/2016; CT chest abdomen pelvis 05/20/2016 FINDINGS: CT CHEST FINDINGS Cardiovascular: Normal heart size. No pericardial effusion. Aorta and main pulmonary artery normal in caliber. Mediastinum/Nodes: No enlarged axillary, mediastinal or hilar lymphadenopathy. Lungs/Pleura: Central airways are patent. Dependent atelectasis bilateral lower lobes and lingula. Unchanged 3 mm right upper lobe  pulmonary nodule (image 35; series 6), stable over time, compatible with benign etiology. No large area of pulmonary consolidation. No pleural effusion or pneumothorax. Musculoskeletal: No aggressive or acute appearing osseous lesions. CT ABDOMEN PELVIS FINDINGS Hepatobiliary: Stable subcentimeter low-attenuation lesion within the hepatic dome (image 47; series 2). No new or enlarging hepatic lesions are identified. Gallbladder is decompressed. No intrahepatic or extrahepatic biliary ductal dilatation. Pancreas: Unremarkable Spleen: Unremarkable Adrenals/Urinary Tract: The adrenal glands are normal. Kidneys enhance symmetrically with contrast. No hydronephrosis. Urinary bladder is unremarkable. Stomach/Bowel: Normal morphology of the stomach. No abnormal bowel wall thickening or evidence for bowel obstruction. No free fluid or free intraperitoneal air. Normal appendix. Vascular/Lymphatic: Normal caliber abdominal aorta. No retroperitoneal lymphadenopathy. Reproductive: Patient status post hysterectomy. Interval decrease in size of previously described solid nodule within the anterior left pelvis measuring 1.3 x 0.9 cm (image 102; series 2), previously 2.5 x 2.1 cm.  Interval decrease and near complete resolution of previously described adjacent low-attenuation structure along the pelvic side wall. Other: None Musculoskeletal: Lumbar spine degenerative changes. No aggressive or acute appearing osseous lesions. IMPRESSION: Interval decrease in size of previously described solid peritoneal nodule within the left anterior pelvis. Near complete resolution of previously described low-attenuation structure along the left pelvic sidewall. No evidence for metastatic disease in the chest. Aortic atherosclerosis. Electronically Signed   By: Lovey Newcomer M.D.   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 The  total chemotherapy treatment is to get 6 cycles of carboplatin and Taxol She is has started to experience some trouble some peripheral neuropathy. I plan to reduce a dose of Taxol but proceed with full dose carboplatin.  She agree with the plan  Cellulitis of right upper extremity This has resolved with additional antibiotics and naproxen We will monitor closely  Peripheral neuropathy due to chemotherapy [facility]) she has mild peripheral neuropathy, likely related to side effects of treatment. I plan to reduce the dose of treatment as outlined above.  I explained to the patient the rationale of this strategy and reassured the patient it would not compromise the efficacy of treatment   Anemia due to antineoplastic chemotherapy This is likely due to recent treatment. The patient denies recent history of bleeding such as epistaxis, hematuria or hematochezia. She is asymptomatic from the anemia. I will observe for now.     No orders of the defined types were placed in this encounter.  All questions were answered. The patient knows to call the clinic with any problems, questions or concerns. No barriers to learning was detected. I spent 15 minutes counseling the patient face to face. The total time spent in the appointment was 20 minutes and more than 50% was on counseling and review of test results     Heath Lark, MD 10/21/2016 1:08 PM

## 2016-10-21 NOTE — Assessment & Plan Note (Signed)
she has mild peripheral neuropathy, likely related to side effects of treatment. °I plan to reduce the dose of treatment as outlined above.  °I explained to the patient the rationale of this strategy and reassured the patient it would not compromise the efficacy of treatment ° °

## 2016-10-21 NOTE — Assessment & Plan Note (Signed)
This has resolved with additional antibiotics and naproxen We will monitor closely

## 2016-10-21 NOTE — Patient Instructions (Signed)
[facility] Discharge Instructions for Patients Receiving Chemotherapy  Today you received the following chemotherapy agents: Taxol and Carboplatin.  To help prevent nausea and vomiting after your treatment, we encourage you to take your nausea medication: Compazine. Take one every 6 hours as needed. For nausea on or after 4/19, take Zofran.  If you develop nausea and vomiting that is not controlled by your nausea medication, call the clinic.   BELOW ARE SYMPTOMS THAT SHOULD BE REPORTED IMMEDIATELY:  *FEVER GREATER THAN 100.5 F  *CHILLS WITH OR WITHOUT FEVER  NAUSEA AND VOMITING THAT IS NOT CONTROLLED WITH YOUR NAUSEA MEDICATION  *UNUSUAL SHORTNESS OF BREATH  *UNUSUAL BRUISING OR BLEEDING  TENDERNESS IN MOUTH AND THROAT WITH OR WITHOUT PRESENCE OF ULCERS  *URINARY PROBLEMS  *BOWEL PROBLEMS  UNUSUAL RASH Items with * indicate a potential emergency and should be followed up as soon as possible.  Feel free to call the clinic you have any questions or concerns. The clinic phone number is [phone].  Please show the [facility] at check-in to the Emergency Department and triage nurse.

## 2016-10-21 NOTE — Assessment & Plan Note (Signed)
I reviewed the plan of care as outlined by her GYN oncologist last year. The total chemotherapy treatment is to get 6 cycles of carboplatin and Taxol She is has started to experience some trouble some peripheral neuropathy. I plan to reduce a dose of Taxol but proceed with full dose carboplatin.  She agree with the plan

## 2016-10-21 NOTE — Assessment & Plan Note (Signed)
This is likely due to recent treatment. The patient denies recent history of bleeding such as epistaxis, hematuria or hematochezia. She is asymptomatic from the anemia. I will observe for now.   

## 2016-11-04 ENCOUNTER — Ambulatory Visit (HOSPITAL_COMMUNITY)
Admission: RE | Admit: 2016-11-04 | Discharge: 2016-11-04 | Disposition: A | Discharge status: Home / Self Care | Payer: Medicare Other | Source: Ambulatory Visit | Attending: Hematology and Oncology | Admitting: Hematology and Oncology

## 2016-11-04 ENCOUNTER — Other Ambulatory Visit: Payer: Self-pay | Admitting: Hematology and Oncology

## 2016-11-04 ENCOUNTER — Ambulatory Visit (HOSPITAL_BASED_OUTPATIENT_CLINIC_OR_DEPARTMENT_OTHER): Payer: Medicare Other | Admitting: Hematology and Oncology

## 2016-11-04 ENCOUNTER — Telehealth: Payer: Self-pay | Admitting: Hematology and Oncology

## 2016-11-04 ENCOUNTER — Telehealth: Payer: Self-pay

## 2016-11-04 DIAGNOSIS — X58XXXA Exposure to other specified factors, initial encounter: Secondary | ICD-10-CM | POA: Insufficient documentation

## 2016-11-04 DIAGNOSIS — L03119 Cellulitis of unspecified part of limb: Secondary | ICD-10-CM

## 2016-11-04 DIAGNOSIS — C541 Malignant neoplasm of endometrium: Secondary | ICD-10-CM

## 2016-11-04 DIAGNOSIS — T801XXA Vascular complications following infusion, transfusion and therapeutic injection, initial encounter: Secondary | ICD-10-CM | POA: Diagnosis present

## 2016-11-04 DIAGNOSIS — L03113 Cellulitis of right upper limb: Secondary | ICD-10-CM | POA: Insufficient documentation

## 2016-11-04 NOTE — Telephone Encounter (Signed)
Patient called and left message. Right arm and hand is swollen 2 x normal size with fever and painful. She states that she is on antibiotics and putting ice on arm.

## 2016-11-04 NOTE — Telephone Encounter (Signed)
Called with below message, instructed patient to check in at admitting for 11 am [place] at [facility]. Given appt info to see Dr. Alvy Bimler at 2:45.

## 2016-11-04 NOTE — Progress Notes (Signed)
*  PRELIMINARY RESULTS* Vascular Ultrasound Right upper extremity venous duplex has been completed.  Preliminary findings: The visualized veins of the right upper extremity appear negative for deep and superficial vein thrombosis.  Attempted to call preliminary report to Dr. Alvy Bimler @ 11:35, no answer.  Patient has appointment today (this afternoon) and will have provider call lab if they have any questions.  Everrett Coombe 11/04/2016, 11:37 AM

## 2016-11-04 NOTE — Telephone Encounter (Signed)
I placed urgent order for STAT US; please call [facility] I have also placed scheduling msg to add her on to be seen at 245 pm

## 2016-11-04 NOTE — Telephone Encounter (Signed)
Called patient to inform her of 4/30 appointment at 245. Patient aware.

## 2016-11-05 ENCOUNTER — Telehealth: Payer: Self-pay | Admitting: *Deleted

## 2016-11-05 ENCOUNTER — Telehealth: Payer: Self-pay

## 2016-11-05 ENCOUNTER — Encounter: Payer: Self-pay | Admitting: Hematology and Oncology

## 2016-11-05 MED ORDER — NAPROXEN SODIUM 220 MG PO CAPS: 220.0000 mg | ORAL_CAPSULE | Freq: Two times a day (BID) | ORAL | 0 refills | Status: DC | PRN

## 2016-11-05 NOTE — Telephone Encounter (Signed)
Notified to hold naprosyn if has reflux and do not take with decadron

## 2016-11-05 NOTE — Assessment & Plan Note (Addendum)
She has recurrent cellulitis of unknown etiology With the patient's permission, I took picture According to the patient, she is responding to Bactrim The patient is instructed to call me if she is not better over the next 2 days I recommend conservative management with Tylenol and ice along with antibiotics treatment Ultrasound venous Doppler excluded DVT

## 2016-11-05 NOTE — Assessment & Plan Note (Signed)
She has received recent chemotherapy. Continue supportive care

## 2016-11-05 NOTE — Telephone Encounter (Signed)
OK to refill Hold if it causes reflux and do not take when she takes her dex

## 2016-11-05 NOTE — Progress Notes (Signed)
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 Lymph node, sentinel, biopsy, right obturator ONE BENIGN LYMPH NODE (0/1) 3. Lymph nodes, regional resection, left pelvic FOUR BENIGN LYMPH NODES (0/4)      01/02/2016 Surgery    Dr. Denman George performed robotic-assisted laparoscopic total hysterectomy with bilateral salpingoophorectomy, sentinel lymph node biopsy, lymphadenectomy        05/15/2016 Pathology Results    Vagina, biopsy, mid - ADENOCARCINOMA, SEE COMMENT. Microscopic Comment The morphology along with the patient's history are consistent with recurrent endometrioid adenocarcinoma. The carcinoma has a similar appearance to the primary ([id number]).      05/20/2016 Imaging    Ct scan abdomen showed solid 2.5 cm peritoneal mass in the mid to anterior left pelvis, suspicious for peritoneal metastasis. 2. Small expansile low-attenuation filling defect in the left external iliac vein, cannot exclude a small deep venous thrombus. Consider correlation with left lower extremity venous Doppler scan. 3. Small simple fluid density structure in the left pelvic sidewall abutting the left external iliac vessels, favor a small postoperative seroma. 4. No ascites. 5. No lymphadenopathy.  No metastatic disease in the chest. 6. Aortic atherosclerosis.      06/12/2016 PET scan    Intensely hypermetabolic 2.1 cm central pelvic peritoneal mass just to the left of midline, consistent with peritoneal metastatic recurrence.  No ascites. 2. No additional hypermetabolic sites of metastatic disease. 3. Diffuse thyroid hypermetabolism without discrete thyroid nodule, favoring thyroiditis. Recommend correlation with serum thyroid function tests.      06/24/2016 - 07/22/2016 Chemotherapy    The patient had weekly cisplatin. She has missed several doses due to infection and pancytopenia      06/24/2016 - 08/01/2016 Radiation Therapy    She completed concurrent radiation therapy Radiation treatment dates:   IMRT : 06/24/16 - 08/01/16 HDR : 08/13/16, 08/20/16, 08/27/16, 09/04/16  Site/dose:   Pelvis treated to 55 Gy in 25 fractions (simultaneous integrated boost technique) Vaginal Cuff treated to 24 Gy in 4 fractions      09/27/2016 Imaging    Interval decrease in size of previously described solid peritoneal nodule within the left anterior pelvis. Near complete resolution of previously described low-attenuation structure along the left pelvic sidewall. No evidence for metastatic disease in the chest. Aortic atherosclerosis.       INTERVAL HISTORY: Please see below for problem oriented charting.   She is seen urgently due to new onset of right upper extremity swelling and redness. She had leftover antibiotics from previous treatment and started taking it several days ago It is slowly improving It is associated with mild tenderness She is concerned about the arm swelling  REVIEW OF SYSTEMS:   Constitutional: Denies fevers, chills or abnormal weight loss Eyes: Denies blurriness of vision Ears, nose, mouth, throat, and face: Denies mucositis or sore throat Respiratory: Denies cough, dyspnea or wheezes Cardiovascular: Denies palpitation, chest discomfort or lower extremity swelling Gastrointestinal:  Denies nausea, heartburn or change in bowel habits Lymphatics: Denies new lymphadenopathy or easy bruising Neurological:Denies numbness, tingling or new weaknesses Behavioral/Psych: Mood is stable, no new  changes  All  other systems were reviewed with the patient and are negative.  I have reviewed the past medical history, past surgical history, social history and family history with the patient and they are unchanged from previous note.  ALLERGIES:  is allergic to latex.  MEDICATIONS:  Current Outpatient Prescriptions  Medication Sig Dispense Refill  . ALPRAZolam (XANAX) 1 MG tablet Take 1/2 to one tablet at bedtime as needed for insomnia and restless legs.    . B Complex Vitamins (VITAMIN B COMPLEX) TABS Take 1 tablet by mouth daily.     . Calcium Carbonate-Vitamin D (CALTRATE COLON HEALTH PO) Take 1 tablet by mouth daily.    . chlorhexidine (PERIDEX) 0.12 % solution Use as directed 15 mLs in the mouth or throat 2 (two) times daily. 120 mL 0  . clonazePAM (KLONOPIN) 0.5 MG tablet Take 0.5 mg by mouth daily as needed (restless legs).    Marland Kitchen dexamethasone (DECADRON) 4 MG tablet Take 5 tablets with food 12 hrs and 6 hrs prior to Taxol chemotherapy. 10 tablet 6  . fluticasone (FLONASE) 50 MCG/ACT nasal spray Place 1 spray into both nostrils 2 (two) times daily as needed.  1  . gabapentin (NEURONTIN) 600 MG tablet TAKE 1 TABLET BY MOUTH TWICE A DAY 60 tablet 9  . levothyroxine (SYNTHROID, LEVOTHROID) 88 MCG tablet Take 88 mcg by mouth daily.  3  . magnesium 30 MG tablet Take 30 mg by mouth daily.     . Melatonin 3 MG TBDP Take 3 mg by mouth at bedtime.     . Multiple Vitamin (MULTIVITAMIN WITH MINERALS) TABS tablet Take 1 tablet by mouth daily.    Marland Kitchen oxyCODONE (OXY IR/ROXICODONE) 5 MG immediate release tablet Take 1 tablet (5 mg total) by mouth every 4 (four) hours as needed for severe pain. 10 tablet 0  . Potassium (POTASSIMIN) 75 MG TABS Take 75 mg by mouth daily.    . protein supplement shake (PREMIER PROTEIN) LIQD Take 325 mLs (11 oz total) by mouth 2 (two) times daily between meals. 14 Can 0  . sulfamethoxazole-trimethoprim (BACTRIM DS,SEPTRA DS) 800-160 MG tablet Take 1 tablet by mouth 2 (two) times daily.  8 tablet 0  . traMADol (ULTRAM) 50 MG tablet     . amoxicillin (AMOXIL) 500 MG capsule Take 500 mg by mouth as needed. Prior to dental appointment, takes 4 tables prior to appt    . famotidine (PEPCID) 20 MG tablet Take 1 tablet (20 mg total) by mouth 2 (two) times daily as needed for heartburn or indigestion. (Patient not taking: Reported on 09/09/2016) 30 tablet 0  . loperamide (IMODIUM) 2 MG capsule Take by mouth as needed for diarrhea or loose stools.    Marland Kitchen LORazepam (ATIVAN) 1 MG tablet Place 1 tablet (1 mg total) under the tongue every 8 (eight) hours as needed (nausea). (Patient not taking: Reported on 09/09/2016) 30 tablet 0  . Morphine Sulfate (MORPHINE CONCENTRATE) 10 MG/0.5ML SOLN concentrated solution Take 0.5 mLs (10 mg total) by mouth every 2 (two) hours as needed for severe pain. (Patient not taking: Reported on 09/09/2016) 15 mL 0  . Naproxen Sodium 220 MG CAPS Take 1 capsule (220 mg total) by mouth 2 (two) times daily as needed (pain). Hold if reflux. Do not take with decadron. 60 each 0  . ondansetron (ZOFRAN) 8 MG tablet Take 1 tablet (8 mg total) by mouth every 8 (eight) hours as needed for nausea. (Patient not taking: Reported on 09/09/2016) 60  tablet 1  . prochlorperazine (COMPAZINE) 10 MG tablet Take 1 tablet (10 mg total) by mouth every 6 (six) hours as needed for nausea. (Patient not taking: Reported on 09/09/2016) 20 tablet 0  . traZODone (DESYREL) 50 MG tablet      No current facility-administered medications for this visit.     PHYSICAL EXAMINATION: ECOG PERFORMANCE STATUS: 1 - Symptomatic but completely ambulatory  Vitals:   11/04/16 1442  BP: 124/67  Pulse: 87  Resp: 20  Temp: 98.1 F (36.7 C)   Filed Weights   11/04/16 1442  Weight: 219 lb 4.8 oz (99.5 kg)    GENERAL:alert, no distress and comfortable SKIN: There is area of skin erythema resemble cellulitis. EYES: normal, Conjunctiva are pink and non-injected, sclera clear OROPHARYNX:no exudate, no erythema and  lips, buccal mucosa, and tongue normal  NECK: supple, thyroid normal size, non-tender, without nodularity LYMPH:  no palpable lymphadenopathy in the cervical, axillary or inguinal LUNGS: clear to auscultation and percussion with normal breathing effort HEART: regular rate & rhythm and no murmurs and no lower extremity edema ABDOMEN:abdomen soft, non-tender and normal bowel sounds Musculoskeletal:no cyanosis of digits and no clubbing  NEURO: alert & oriented x 3 with fluent speech, no focal motor/sensory deficits  LABORATORY DATA:  I have reviewed the data as listed    Component Value Date/Time   NA 138 10/21/2016 0951   K 4.4 10/21/2016 0951   CL 106 08/30/2016 0527   CO2 21 (L) 10/21/2016 0951   GLUCOSE 185 (H) 10/21/2016 0951   BUN 21.4 10/21/2016 0951   CREATININE 1.0 10/21/2016 0951   CALCIUM 10.3 10/21/2016 0951   PROT 7.2 10/21/2016 0951   ALBUMIN 3.8 10/21/2016 0951   AST 34 10/21/2016 0951   ALT 35 10/21/2016 0951   ALKPHOS 130 10/21/2016 0951   BILITOT 0.39 10/21/2016 0951   GFRNONAA >60 08/31/2016 0539   GFRAA >60 08/31/2016 0539    No results found for: SPEP, UPEP  Lab Results  Component Value Date   WBC 6.2 10/21/2016   NEUTROABS 5.6 10/21/2016   HGB 10.6 (L) 10/21/2016   HCT 30.6 (L) 10/21/2016   MCV 87.9 10/21/2016   PLT 188 10/21/2016      Chemistry      Component Value Date/Time   NA 138 10/21/2016 0951   K 4.4 10/21/2016 0951   CL 106 08/30/2016 0527   CO2 21 (L) 10/21/2016 0951   BUN 21.4 10/21/2016 0951   CREATININE 1.0 10/21/2016 0951      Component Value Date/Time   CALCIUM 10.3 10/21/2016 0951   ALKPHOS 130 10/21/2016 0951   AST 34 10/21/2016 0951   ALT 35 10/21/2016 0951   BILITOT 0.39 10/21/2016 0951     Venous Doppler exclude DVT       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 Continue supportive care  Cellulitis of antecubital fossa She has recurrent cellulitis  of unknown etiology With the patient's permission, I took picture According to the patient, she is responding to Bactrim The patient is instructed to call me if she is not better over the next 2 days I recommend conservative management with Tylenol and ice along with antibiotics treatment Ultrasound venous Doppler excluded DVT   No orders of the defined types were placed in this encounter.  All questions were answered. The patient knows to call the clinic with any problems, questions or concerns. No barriers to learning was detected. I spent 15  minutes counseling the patient face to face. The total time spent in the appointment was 20 minutes and more than 50% was on counseling and review of test results     Heath Lark, MD 11/05/2016 6:59 PM

## 2016-11-05 NOTE — Telephone Encounter (Signed)
Pt stated she went to pharmacy to refill naprosyn sodium and the pharmacy told her the MD will not fill this again. The pt was under the impression Dr Alvy Bimler wanted her to take it. Please advise.

## 2016-11-06 ENCOUNTER — Telehealth: Payer: Self-pay | Admitting: *Deleted

## 2016-11-06 NOTE — Telephone Encounter (Signed)
Pt called with update on hand. States she has been icing her hand and the swelling is gone. Still has painful  red knot @ wrist, but is not any worse. Continues to use ice.

## 2016-11-11 ENCOUNTER — Telehealth: Payer: Self-pay | Admitting: Hematology and Oncology

## 2016-11-11 ENCOUNTER — Other Ambulatory Visit: Payer: Self-pay | Admitting: Hematology and Oncology

## 2016-11-11 ENCOUNTER — Encounter: Payer: Self-pay | Admitting: Hematology and Oncology

## 2016-11-11 ENCOUNTER — Other Ambulatory Visit (HOSPITAL_BASED_OUTPATIENT_CLINIC_OR_DEPARTMENT_OTHER): Payer: Medicare Other

## 2016-11-11 ENCOUNTER — Ambulatory Visit (HOSPITAL_BASED_OUTPATIENT_CLINIC_OR_DEPARTMENT_OTHER): Payer: Medicare Other

## 2016-11-11 ENCOUNTER — Ambulatory Visit (HOSPITAL_BASED_OUTPATIENT_CLINIC_OR_DEPARTMENT_OTHER): Payer: Medicare Other | Admitting: Hematology and Oncology

## 2016-11-11 VITALS — BP 150/90 | HR 85 | Temp 97.6°F | Resp 18 | Ht 66.0 in | Wt 215.3 lb

## 2016-11-11 DIAGNOSIS — Z5189 Encounter for other specified aftercare: Secondary | ICD-10-CM

## 2016-11-11 DIAGNOSIS — G620 Drug-induced polyneuropathy: Secondary | ICD-10-CM | POA: Diagnosis not present

## 2016-11-11 DIAGNOSIS — L03113 Cellulitis of right upper limb: Secondary | ICD-10-CM | POA: Diagnosis not present

## 2016-11-11 DIAGNOSIS — I10 Essential (primary) hypertension: Secondary | ICD-10-CM

## 2016-11-11 DIAGNOSIS — C541 Malignant neoplasm of endometrium: Secondary | ICD-10-CM

## 2016-11-11 DIAGNOSIS — D6481 Anemia due to antineoplastic chemotherapy: Secondary | ICD-10-CM

## 2016-11-11 DIAGNOSIS — Z5111 Encounter for antineoplastic chemotherapy: Secondary | ICD-10-CM

## 2016-11-11 DIAGNOSIS — T451X5A Adverse effect of antineoplastic and immunosuppressive drugs, initial encounter: Secondary | ICD-10-CM

## 2016-11-11 LAB — CBC WITH DIFFERENTIAL/PLATELET
BASO%: 0.1 % (ref 0.0–2.0)
BASOS ABS: 0 10*3/uL (ref 0.0–0.1)
EOS ABS: 0 10*3/uL (ref 0.0–0.5)
EOS%: 0 % (ref 0.0–7.0)
HEMATOCRIT: 32.1 % — AB (ref 34.8–46.6)
HEMOGLOBIN: 11.1 g/dL — AB (ref 11.6–15.9)
LYMPH%: 6.8 % — ABNORMAL LOW (ref 14.0–49.7)
MCH: 31.6 pg (ref 25.1–34.0)
MCHC: 34.6 g/dL (ref 31.5–36.0)
MCV: 91.3 fL (ref 79.5–101.0)
MONO#: 0 10*3/uL — ABNORMAL LOW (ref 0.1–0.9)
MONO%: 0.6 % (ref 0.0–14.0)
NEUT%: 92.5 % — ABNORMAL HIGH (ref 38.4–76.8)
NEUTROS ABS: 6 10*3/uL (ref 1.5–6.5)
PLATELETS: 238 10*3/uL (ref 145–400)
RBC: 3.51 10*6/uL — ABNORMAL LOW (ref 3.70–5.45)
RDW: 16.4 % — AB (ref 11.2–14.5)
WBC: 6.5 10*3/uL (ref 3.9–10.3)
lymph#: 0.4 10*3/uL — ABNORMAL LOW (ref 0.9–3.3)

## 2016-11-11 LAB — COMPREHENSIVE METABOLIC PANEL
ALBUMIN: 3.7 g/dL (ref 3.5–5.0)
ALK PHOS: 135 U/L (ref 40–150)
ALT: 42 U/L (ref 0–55)
ANION GAP: 10 meq/L (ref 3–11)
AST: 28 U/L (ref 5–34)
BILIRUBIN TOTAL: 0.32 mg/dL (ref 0.20–1.20)
BUN: 19.9 mg/dL (ref 7.0–26.0)
CALCIUM: 10.1 mg/dL (ref 8.4–10.4)
CO2: 23 mEq/L (ref 22–29)
CREATININE: 0.8 mg/dL (ref 0.6–1.1)
Chloride: 104 mEq/L (ref 98–109)
EGFR: 72 mL/min/{1.73_m2} — AB (ref >90)
Glucose: 146 mg/dl — ABNORMAL HIGH (ref 70–140)
Potassium: 4.1 mEq/L (ref 3.5–5.1)
Sodium: 137 mEq/L (ref 136–145)
Total Protein: 7.1 g/dL (ref 6.4–8.3)

## 2016-11-11 MED ORDER — CARBOPLATIN CHEMO INJECTION 600 MG/60ML
640.0000 mg | Freq: Once | INTRAVENOUS | Status: AC
  Administered 2016-11-11: 640 mg via INTRAVENOUS
  Filled 2016-11-11: qty 64

## 2016-11-11 MED ORDER — PALONOSETRON HCL INJECTION 0.25 MG/5ML
0.2500 mg | Freq: Once | INTRAVENOUS | Status: AC
  Administered 2016-11-11: 0.25 mg via INTRAVENOUS

## 2016-11-11 MED ORDER — DIPHENHYDRAMINE HCL 50 MG/ML IJ SOLN
INTRAMUSCULAR | Status: AC
  Filled 2016-11-11: qty 1

## 2016-11-11 MED ORDER — DIPHENHYDRAMINE HCL 50 MG/ML IJ SOLN
50.0000 mg | Freq: Once | INTRAMUSCULAR | Status: AC
  Administered 2016-11-11: 50 mg via INTRAVENOUS

## 2016-11-11 MED ORDER — CEPHALEXIN 500 MG PO CAPS: 500.0000 mg | ORAL_CAPSULE | Freq: Three times a day (TID) | ORAL | 0 refills | Status: DC

## 2016-11-11 MED ORDER — SODIUM CHLORIDE 0.9 % IV SOLN
Freq: Once | INTRAVENOUS | Status: AC
  Administered 2016-11-11: 12:00:00 via INTRAVENOUS
  Filled 2016-11-11: qty 5

## 2016-11-11 MED ORDER — SODIUM CHLORIDE 0.9 % IV SOLN
Freq: Once | INTRAVENOUS | Status: AC
  Administered 2016-11-11: 12:00:00 via INTRAVENOUS

## 2016-11-11 MED ORDER — MORPHINE SULFATE 15 MG PO TABS: 15.0000 mg | ORAL_TABLET | ORAL | 0 refills | Status: DC | PRN

## 2016-11-11 MED ORDER — FAMOTIDINE IN NACL 20-0.9 MG/50ML-% IV SOLN
20.0000 mg | Freq: Once | INTRAVENOUS | Status: AC
  Administered 2016-11-11: 20 mg via INTRAVENOUS

## 2016-11-11 MED ORDER — PALONOSETRON HCL INJECTION 0.25 MG/5ML
INTRAVENOUS | Status: AC
  Filled 2016-11-11: qty 5

## 2016-11-11 MED ORDER — DEXTROSE 5 % IV SOLN
131.2500 mg/m2 | Freq: Once | INTRAVENOUS | Status: AC
  Administered 2016-11-11: 276 mg via INTRAVENOUS
  Filled 2016-11-11: qty 46

## 2016-11-11 MED ORDER — FAMOTIDINE IN NACL 20-0.9 MG/50ML-% IV SOLN
INTRAVENOUS | Status: AC
  Filled 2016-11-11: qty 50

## 2016-11-11 MED ORDER — PEGFILGRASTIM 6 MG/0.6ML ~~LOC~~ PSKT
6.0000 mg | PREFILLED_SYRINGE | Freq: Once | SUBCUTANEOUS | Status: AC
  Administered 2016-11-11: 6 mg via SUBCUTANEOUS
  Filled 2016-11-11: qty 0.6

## 2016-11-11 NOTE — Assessment & Plan Note (Signed)
I reviewed the plan of care as outlined by her GYN oncologist last year. The total chemotherapy treatment is to get 6 cycles of carboplatin and Taxol She is has started to experience some trouble some peripheral neuropathy. I plan to reduce a dose of Taxol but proceed with full dose carboplatin.  She agree with the plan Plan would be to complete 6 cycles of treatment and then repeat imaging study in June

## 2016-11-11 NOTE — Assessment & Plan Note (Signed)
Her blood pressure is elevated. Could be due to whitecoat hypertension. I recommend close monitoring only. 

## 2016-11-11 NOTE — Assessment & Plan Note (Signed)
This is likely due to recent treatment. The patient denies recent history of bleeding such as epistaxis, hematuria or hematochezia. She is asymptomatic from the anemia. I will observe for now.   

## 2016-11-11 NOTE — Assessment & Plan Note (Signed)
The patient had recurrent cellulitis on and off over the past few months Recent cellulitis had improved with antibiotics treatment and conservative management Should she have recurrence of cellulitis again, I recommend she take picture of the arm swelling and call me the next day I will also get her prescription Keflex to take as needed if her cellulitis recurred

## 2016-11-11 NOTE — Patient Instructions (Signed)
[facility] Discharge Instructions for Patients Receiving Chemotherapy  Today you received the following chemotherapy agents: Taxol and Carbopaltin  To help prevent nausea and vomiting after your treatment, we encourage you to take your nausea medication as directed.    If you develop nausea and vomiting that is not controlled by your nausea medication, call the clinic.   BELOW ARE SYMPTOMS THAT SHOULD BE REPORTED IMMEDIATELY:  *FEVER GREATER THAN 100.5 F  *CHILLS WITH OR WITHOUT FEVER  NAUSEA AND VOMITING THAT IS NOT CONTROLLED WITH YOUR NAUSEA MEDICATION  *UNUSUAL SHORTNESS OF BREATH  *UNUSUAL BRUISING OR BLEEDING  TENDERNESS IN MOUTH AND THROAT WITH OR WITHOUT PRESENCE OF ULCERS  *URINARY PROBLEMS  *BOWEL PROBLEMS  UNUSUAL RASH Items with * indicate a potential emergency and should be followed up as soon as possible.  Feel free to call the clinic you have any questions or concerns. The clinic phone number is [phone].  Please show the [facility] at check-in to the Emergency Department and triage nurse.

## 2016-11-11 NOTE — Telephone Encounter (Signed)
Appointments scheduled per 5.7.18 LOS. Patient given AVS report and calendars with future scheduled appointments. Patient also given two bottles of oral contrast and instructions for CT scan appointment.

## 2016-11-11 NOTE — Assessment & Plan Note (Addendum)
she has mild peripheral neuropathy, likely related to side effects of treatment. I plan to reduce the dose of treatment as outlined above.  I explained to the patient the rationale of this strategy and reassured the patient it would not compromise the efficacy of treatment I recommend increasing gabapentin to 3 times a day along with pain medicine as needed We discussed narcotic refill policy and I warned her about risk of constipation I will assess pain management in her next visit.

## 2016-11-11 NOTE — Progress Notes (Signed)
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 Lymph node, sentinel, biopsy, right obturator ONE BENIGN LYMPH NODE (0/1) 3. Lymph nodes, regional resection, left pelvic FOUR BENIGN LYMPH NODES (0/4)      01/02/2016 Surgery    Dr. Denman George performed robotic-assisted laparoscopic total hysterectomy with bilateral salpingoophorectomy, sentinel lymph node biopsy, lymphadenectomy        05/15/2016 Pathology Results    Vagina, biopsy, mid - ADENOCARCINOMA, SEE COMMENT. Microscopic Comment The morphology along with the patient's history are consistent with recurrent endometrioid adenocarcinoma. The carcinoma has a similar appearance to the primary ([id number]).      05/20/2016 Imaging    Ct scan abdomen showed solid 2.5 cm peritoneal mass in the mid to anterior left pelvis, suspicious for peritoneal metastasis. 2. Small expansile low-attenuation filling defect in the left external iliac vein, cannot exclude a small deep venous thrombus. Consider correlation with left lower extremity venous Doppler scan. 3. Small simple fluid density structure in the left pelvic sidewall abutting the left external iliac vessels, favor a small postoperative seroma. 4. No ascites. 5. No lymphadenopathy.  No metastatic disease in the chest. 6. Aortic atherosclerosis.      06/12/2016 PET scan    Intensely hypermetabolic 2.1 cm central pelvic peritoneal mass just to the left of midline, consistent with peritoneal metastatic recurrence.  No ascites. 2. No additional hypermetabolic sites of metastatic disease. 3. Diffuse thyroid hypermetabolism without discrete thyroid nodule, favoring thyroiditis. Recommend correlation with serum thyroid function tests.      06/24/2016 - 07/22/2016 Chemotherapy    The patient had weekly cisplatin. She has missed several doses due to infection and pancytopenia      06/24/2016 - 08/01/2016 Radiation Therapy    She completed concurrent radiation therapy Radiation treatment dates:   IMRT : 06/24/16 - 08/01/16 HDR : 08/13/16, 08/20/16, 08/27/16, 09/04/16  Site/dose:   Pelvis treated to 55 Gy in 25 fractions (simultaneous integrated boost technique) Vaginal Cuff treated to 24 Gy in 4 fractions      09/27/2016 Imaging    Interval decrease in size of previously described solid peritoneal nodule within the left anterior pelvis. Near complete resolution of previously described low-attenuation structure along the left pelvic sidewall. No evidence for metastatic disease in the chest. Aortic atherosclerosis.       INTERVAL HISTORY: Please see below for problem oriented charting. She returns prior to cycle 5 of chemotherapy She felt that neuropathy is worse Her hand cellulitis is improving on antibiotic treatment and conservative management She denies mouth sores, nausea or vomiting  REVIEW OF SYSTEMS:   Constitutional: Denies fevers, chills or abnormal weight loss Eyes: Denies blurriness of vision Ears, nose, mouth, throat, and face: Denies mucositis or sore throat Respiratory: Denies cough, dyspnea or wheezes Cardiovascular: Denies palpitation, chest discomfort or lower extremity swelling Gastrointestinal:  Denies nausea, heartburn or change in bowel habits Lymphatics: Denies new lymphadenopathy or easy bruising Neurological:Denies numbness, tingling or new weaknesses Behavioral/Psych: Mood is stable, no new changes  All other systems were reviewed with the patient and are negative.  I have  reviewed the past medical history, past surgical history, social history and family history with the patient and they are unchanged from previous note.  ALLERGIES:  is allergic to latex.  MEDICATIONS:  Current Outpatient Prescriptions  Medication Sig Dispense Refill  . acetaminophen (TYLENOL) 325 MG tablet Take 650 mg by mouth every 6 (six) hours as needed.    . ALPRAZolam (XANAX) 1 MG tablet Take 1/2 to one tablet at bedtime as needed for insomnia and restless legs.    . B Complex Vitamins (VITAMIN B COMPLEX) TABS Take 1 tablet by mouth daily.     . Calcium Carbonate-Vitamin D (CALTRATE COLON HEALTH PO) Take 1 tablet by mouth daily.    . chlorhexidine (PERIDEX) 0.12 % solution Use as directed 15 mLs in the mouth or throat 2 (two) times daily. 120 mL 0  . clonazePAM (KLONOPIN) 0.5 MG tablet Take 0.5 mg by mouth daily as needed (restless legs).    Marland Kitchen dexamethasone (DECADRON) 4 MG tablet Take 5 tablets with food 12 hrs and 6 hrs prior to Taxol chemotherapy. 10 tablet 6  . fluticasone (FLONASE) 50 MCG/ACT nasal spray Place 1 spray into both nostrils 2 (two) times daily as needed.  1  . gabapentin (NEURONTIN) 600 MG tablet TAKE 1 TABLET BY MOUTH TWICE A DAY 60 tablet 9  . levothyroxine (SYNTHROID, LEVOTHROID) 88 MCG tablet Take 88 mcg by mouth daily.  3  . loperamide (IMODIUM) 2 MG capsule Take by mouth as needed for diarrhea or loose stools.    . magnesium 30 MG tablet Take 30 mg by mouth daily.     . Melatonin 3 MG TBDP Take 3 mg by mouth at bedtime.     . Multiple Vitamin (MULTIVITAMIN WITH MINERALS) TABS tablet Take 1 tablet by mouth daily.    . Naproxen Sodium 220 MG CAPS Take 1 capsule (220 mg total) by mouth 2 (two) times daily as needed (pain). Hold if reflux. Do not take with decadron. 60 each 0  . Potassium (POTASSIMIN) 75 MG TABS Take 75 mg by mouth daily.    . protein supplement shake (PREMIER PROTEIN) LIQD Take 325 mLs (11 oz total) by mouth 2 (two) times daily between meals. 14 Can 0   . traMADol (ULTRAM) 50 MG tablet     . traZODone (DESYREL) 50 MG tablet     . amoxicillin (AMOXIL) 500 MG capsule Take 500 mg by mouth as needed. Prior to dental appointment, takes 4 tables prior to appt    . cephALEXin (KEFLEX) 500 MG capsule Take 1 capsule (500 mg total) by mouth 3 (three) times daily. 21 capsule 0  . famotidine (PEPCID) 20 MG tablet Take 1 tablet (20 mg total) by mouth 2 (two) times daily as needed for heartburn or indigestion. (Patient not taking: Reported on 09/09/2016) 30 tablet 0  . LORazepam (ATIVAN) 1 MG tablet Place 1 tablet (1 mg total) under the tongue every 8 (eight) hours as needed (nausea). (Patient not taking: Reported on 09/09/2016) 30 tablet 0  . morphine (MSIR) 15 MG tablet Take 1 tablet (15 mg total) by mouth every 4 (four) hours as needed for severe pain. 30 tablet 0  . Morphine Sulfate (MORPHINE CONCENTRATE) 10 MG/0.5ML SOLN concentrated solution Take 0.5 mLs (10 mg total) by mouth every 2 (two) hours as needed for severe pain. (Patient not taking: Reported on 09/09/2016) 15 mL 0  . ondansetron (ZOFRAN) 8 MG tablet Take 1 tablet (8 mg total) by mouth every 8 (eight) hours  as needed for nausea. (Patient not taking: Reported on 09/09/2016) 60 tablet 1  . oxyCODONE (OXY IR/ROXICODONE) 5 MG immediate release tablet Take 1 tablet (5 mg total) by mouth every 4 (four) hours as needed for severe pain. (Patient not taking: Reported on 11/11/2016) 10 tablet 0  . prochlorperazine (COMPAZINE) 10 MG tablet Take 1 tablet (10 mg total) by mouth every 6 (six) hours as needed for nausea. (Patient not taking: Reported on 09/09/2016) 20 tablet 0   No current facility-administered medications for this visit.     PHYSICAL EXAMINATION: ECOG PERFORMANCE STATUS: 1 - Symptomatic but completely ambulatory  Vitals:   11/11/16 1026  BP: (!) 150/90  Pulse: 85  Resp: 18  Temp: 97.6 F (36.4 C)   Filed Weights   11/11/16 1026  Weight: 215 lb 4.8 oz (97.7 kg)    GENERAL:alert, no distress  and comfortable SKIN: skin color, texture, turgor are normal, no rashes or significant lesions EYES: normal, Conjunctiva are pink and non-injected, sclera clear OROPHARYNX:no exudate, no erythema and lips, buccal mucosa, and tongue normal  NECK: supple, thyroid normal size, non-tender, without nodularity LYMPH:  no palpable lymphadenopathy in the cervical, axillary or inguinal LUNGS: clear to auscultation and percussion with normal breathing effort HEART: regular rate & rhythm and no murmurs and no lower extremity edema ABDOMEN:abdomen soft, non-tender and normal bowel sounds Musculoskeletal:no cyanosis of digits and no clubbing  NEURO: alert & oriented x 3 with fluent speech, no focal motor/sensory deficits  LABORATORY DATA:  I have reviewed the data as listed    Component Value Date/Time   NA 137 11/11/2016 1007   K 4.1 11/11/2016 1007   CL 106 08/30/2016 0527   CO2 23 11/11/2016 1007   GLUCOSE 146 (H) 11/11/2016 1007   BUN 19.9 11/11/2016 1007   CREATININE 0.8 11/11/2016 1007   CALCIUM 10.1 11/11/2016 1007   PROT 7.1 11/11/2016 1007   ALBUMIN 3.7 11/11/2016 1007   AST 28 11/11/2016 1007   ALT 42 11/11/2016 1007   ALKPHOS 135 11/11/2016 1007   BILITOT 0.32 11/11/2016 1007   GFRNONAA >60 08/31/2016 0539   GFRAA >60 08/31/2016 0539    No results found for: SPEP, UPEP  Lab Results  Component Value Date   WBC 6.5 11/11/2016   NEUTROABS 6.0 11/11/2016   HGB 11.1 (L) 11/11/2016   HCT 32.1 (L) 11/11/2016   MCV 91.3 11/11/2016   PLT 238 11/11/2016      Chemistry      Component Value Date/Time   NA 137 11/11/2016 1007   K 4.1 11/11/2016 1007   CL 106 08/30/2016 0527   CO2 23 11/11/2016 1007   BUN 19.9 11/11/2016 1007   CREATININE 0.8 11/11/2016 1007      Component Value Date/Time   CALCIUM 10.1 11/11/2016 1007   ALKPHOS 135 11/11/2016 1007   AST 28 11/11/2016 1007   ALT 42 11/11/2016 1007   BILITOT 0.32 11/11/2016 1007       ASSESSMENT & PLAN:  Recurrent  carcinoma of endometrium ([place]) I reviewed the plan of care as outlined by her GYN oncologist last year. The total chemotherapy treatment is to get 6 cycles of carboplatin and Taxol She is has started to experience some trouble some peripheral neuropathy. I plan to reduce a dose of Taxol but proceed with full dose carboplatin.  She agree with the plan Plan would be to complete 6 cycles of treatment and then repeat imaging study in June  Peripheral neuropathy due to  chemotherapy [facility]) she has mild peripheral neuropathy, likely related to side effects of treatment. I plan to reduce the dose of treatment as outlined above.  I explained to the patient the rationale of this strategy and reassured the patient it would not compromise the efficacy of treatment I recommend increasing gabapentin to 3 times a day along with pain medicine as needed We discussed narcotic refill policy and I warned her about risk of constipation I will assess pain management in her next visit.   Anemia due to antineoplastic chemotherapy This is likely due to recent treatment. The patient denies recent history of bleeding such as epistaxis, hematuria or hematochezia. She is asymptomatic from the anemia. I will observe for now.    Essential hypertension Her blood pressure is elevated. Could be due to whitecoat hypertension. I recommend close monitoring only.  Cellulitis of right upper extremity The patient had recurrent cellulitis on and off over the past few months Recent cellulitis had improved with antibiotics treatment and conservative management Should she have recurrence of cellulitis again, I recommend she take picture of the arm swelling and call me the next day I will also get her prescription Keflex to take as needed if her cellulitis recurred   No orders of the defined types were placed in this encounter.  All questions were answered. The patient knows to call the clinic with any problems, questions or  concerns. No barriers to learning was detected. I spent 25 minutes counseling the patient face to face. The total time spent in the appointment was 30 minutes and more than 50% was on counseling and review of test results     Heath Lark, MD 11/11/2016 11:39 AM

## 2016-11-12 ENCOUNTER — Telehealth: Payer: Self-pay

## 2016-11-12 ENCOUNTER — Telehealth: Payer: Self-pay | Admitting: *Deleted

## 2016-11-12 ENCOUNTER — Other Ambulatory Visit: Payer: Self-pay | Admitting: Hematology and Oncology

## 2016-11-12 ENCOUNTER — Telehealth: Payer: Self-pay | Admitting: Hematology and Oncology

## 2016-11-12 NOTE — Telephone Encounter (Signed)
Scheduled inj appt per sch message from Rn Jan - patient aware of appt date and time.

## 2016-11-12 NOTE — Telephone Encounter (Signed)
Pt called stating the Neulasta onpro device was malfunctioned with red light flashing.  Spoke with pt; nurse could hear the beeping sound from the device.  Instructed pt to come in now for infusion nurse to evaluate onpro issue.  Spoke with Dr. Alvy Bimler, and was informed that if onpro device indeed was malfunctioned, pt will need to come back tomorrow 11/13/16 for Neulasta injection.  Myrtie Neither, RN infusion nurse notified of Dr. Calton Dach instructions.

## 2016-11-12 NOTE — Telephone Encounter (Signed)
Tinnitus is not a usual side-effects. Not sure what could cause it Recommend wait to see if it clears. If not, ENT consult

## 2016-11-12 NOTE — Telephone Encounter (Signed)
Hassan Rowan, can you touch base with Lu Ann/Alexis tomorrow to make sure it gets approved? Thanks

## 2016-11-12 NOTE — Telephone Encounter (Signed)
"  I received chemotherapy yesterday.  Today about 1:00 pm I started having intense ringing in both ears.  Is this from the chemotherapy.  It's never happened before and I've never had ringing in my ears.  I was taking Tylenol 625 mg twice daily for the past week for a knot on my arm.  I will stop taking the tylenol.  I've put plugs in my ears.  What else do I need to do?  Call me at [phone]."

## 2016-11-12 NOTE — Telephone Encounter (Signed)
Patient came to infusion room for evaluation of her On-Pro device that is not functioning correctly. She states that it started alarming around 1330 and she noticed that the light was red instead of green. Upon inspection, the device has a red light, is alarming and fill indicator is marked at "full." The malfunctioning device was removed and given to pharmacy. The patient has been given instructions to return tomorrow to receive a neulasta injection per protocol. Patient verbalizes understanding.

## 2016-11-12 NOTE — Telephone Encounter (Signed)
Left below message. 

## 2016-11-13 ENCOUNTER — Ambulatory Visit (HOSPITAL_BASED_OUTPATIENT_CLINIC_OR_DEPARTMENT_OTHER): Payer: Medicare Other

## 2016-11-13 VITALS — BP 153/74 | HR 71 | Temp 97.9°F | Resp 20

## 2016-11-13 DIAGNOSIS — Z5189 Encounter for other specified aftercare: Secondary | ICD-10-CM | POA: Diagnosis not present

## 2016-11-13 DIAGNOSIS — C541 Malignant neoplasm of endometrium: Secondary | ICD-10-CM

## 2016-11-13 MED ORDER — PEGFILGRASTIM INJECTION 6 MG/0.6ML ~~LOC~~
6.0000 mg | PREFILLED_SYRINGE | Freq: Once | SUBCUTANEOUS | Status: AC
  Administered 2016-11-13: 6 mg via SUBCUTANEOUS
  Filled 2016-11-13: qty 0.6

## 2016-11-13 NOTE — Patient Instructions (Signed)
Pegfilgrastim injection What is this medicine? PEGFILGRASTIM (PEG fil gra stim) is a long-acting granulocyte colony-stimulating factor that stimulates the growth of neutrophils, a type of white blood cell important in the body's fight against infection. It is used to reduce the incidence of fever and infection in patients with certain types of cancer who are receiving chemotherapy that affects the bone marrow, and to increase survival after being exposed to high doses of radiation. This medicine may be used for other purposes; ask your health care provider or pharmacist if you have questions. COMMON BRAND NAME(S): Neulasta What should I tell my health care provider before I take this medicine? They need to know if you have any of these conditions: -kidney disease -latex allergy -ongoing radiation therapy -sickle cell disease -skin reactions to acrylic adhesives (On-Body Injector only) -an unusual or allergic reaction to pegfilgrastim, filgrastim, other medicines, foods, dyes, or preservatives -pregnant or trying to get pregnant -breast-feeding How should I use this medicine? This medicine is for injection under the skin. If you get this medicine at home, you will be taught how to prepare and give the pre-filled syringe or how to use the On-body Injector. Refer to the patient Instructions for Use for detailed instructions. Use exactly as directed. Tell your healthcare provider immediately if you suspect that the On-body Injector may not have performed as intended or if you suspect the use of the On-body Injector resulted in a missed or partial dose. It is important that you put your used needles and syringes in a special sharps container. Do not put them in a trash can. If you do not have a sharps container, call your pharmacist or healthcare provider to get one. Talk to your pediatrician regarding the use of this medicine in children. While this drug may be prescribed for selected conditions,  precautions do apply. Overdosage: If you think you have taken too much of this medicine contact a poison control center or emergency room at once. NOTE: This medicine is only for you. Do not share this medicine with others. What if I miss a dose? It is important not to miss your dose. Call your doctor or health care professional if you miss your dose. If you miss a dose due to an On-body Injector failure or leakage, a new dose should be administered as soon as possible using a single prefilled syringe for manual use. What may interact with this medicine? Interactions have not been studied. Give your health care provider a list of all the medicines, herbs, non-prescription drugs, or dietary supplements you use. Also tell them if you smoke, drink alcohol, or use illegal drugs. Some items may interact with your medicine. This list may not describe all possible interactions. Give your health care provider a list of all the medicines, herbs, non-prescription drugs, or dietary supplements you use. Also tell them if you smoke, drink alcohol, or use illegal drugs. Some items may interact with your medicine. What should I watch for while using this medicine? You may need blood work done while you are taking this medicine. If you are going to need a MRI, CT scan, or other procedure, tell your doctor that you are using this medicine (On-Body Injector only). What side effects may I notice from receiving this medicine? Side effects that you should report to your doctor or health care professional as soon as possible: -allergic reactions like skin rash, itching or hives, swelling of the face, lips, or tongue -dizziness -fever -pain, redness, or irritation at site   where injected -pinpoint red spots on the skin -red or dark-brown urine -shortness of breath or breathing problems -stomach or side pain, or pain at the shoulder -swelling -tiredness -trouble passing urine or change in the amount of urine Side  effects that usually do not require medical attention (report to your doctor or health care professional if they continue or are bothersome): -bone pain -muscle pain This list may not describe all possible side effects. Call your doctor for medical advice about side effects. You may report side effects to FDA at 1-800-FDA-1088. Where should I keep my medicine? Keep out of the reach of children. Store pre-filled syringes in a refrigerator between 2 and 8 degrees C (36 and 46 degrees F). Do not freeze. Keep in carton to protect from light. Throw away this medicine if it is left out of the refrigerator for more than 48 hours. Throw away any unused medicine after the expiration date. NOTE: This sheet is a summary. It may not cover all possible information. If you have questions about this medicine, talk to your doctor, pharmacist, or health care provider.  2018 [employer]vier/Gold Standard (2016-06-20 12:58:03)  

## 2016-11-21 ENCOUNTER — Telehealth: Payer: Self-pay

## 2016-11-21 NOTE — Telephone Encounter (Signed)
Pt has swelling in left posterior hand starting on pinky side and spreading towards thumb. Noticed last night. Has a small knot that is tender to touch. Otherwise not painful. No redness. No heat. She did start Keflex this morning and plans to take for 7 days, per prior instructions from Dr Alvy Bimler. She did draw a circle around the swelling.  Is calling to inform Dr Alvy Bimler. She will use elevation and ice as well.

## 2016-11-21 NOTE — Telephone Encounter (Signed)
Reminded pt to take pictures as well. She can show to Dr Alvy Bimler on her next visit.

## 2016-11-21 NOTE — Telephone Encounter (Signed)
She is doing everything right Please remind her to take pictures as well

## 2016-12-03 ENCOUNTER — Encounter: Payer: Self-pay | Admitting: Hematology and Oncology

## 2016-12-03 ENCOUNTER — Ambulatory Visit (HOSPITAL_BASED_OUTPATIENT_CLINIC_OR_DEPARTMENT_OTHER): Payer: Medicare Other

## 2016-12-03 ENCOUNTER — Other Ambulatory Visit (HOSPITAL_BASED_OUTPATIENT_CLINIC_OR_DEPARTMENT_OTHER): Payer: Medicare Other

## 2016-12-03 ENCOUNTER — Ambulatory Visit (HOSPITAL_BASED_OUTPATIENT_CLINIC_OR_DEPARTMENT_OTHER): Payer: Medicare Other | Admitting: Hematology and Oncology

## 2016-12-03 DIAGNOSIS — T451X5A Adverse effect of antineoplastic and immunosuppressive drugs, initial encounter: Secondary | ICD-10-CM

## 2016-12-03 DIAGNOSIS — D6481 Anemia due to antineoplastic chemotherapy: Secondary | ICD-10-CM

## 2016-12-03 DIAGNOSIS — G620 Drug-induced polyneuropathy: Secondary | ICD-10-CM

## 2016-12-03 DIAGNOSIS — C541 Malignant neoplasm of endometrium: Secondary | ICD-10-CM

## 2016-12-03 DIAGNOSIS — Z5111 Encounter for antineoplastic chemotherapy: Secondary | ICD-10-CM

## 2016-12-03 DIAGNOSIS — L03114 Cellulitis of left upper limb: Secondary | ICD-10-CM | POA: Diagnosis not present

## 2016-12-03 DIAGNOSIS — T801XXA Vascular complications following infusion, transfusion and therapeutic injection, initial encounter: Secondary | ICD-10-CM

## 2016-12-03 LAB — CBC WITH DIFFERENTIAL/PLATELET
BASO%: 0.1 % (ref 0.0–2.0)
Basophils Absolute: 0 10*3/uL (ref 0.0–0.1)
EOS ABS: 0 10*3/uL (ref 0.0–0.5)
EOS%: 0 % (ref 0.0–7.0)
HCT: 30.7 % — ABNORMAL LOW (ref 34.8–46.6)
HEMOGLOBIN: 10.3 g/dL — AB (ref 11.6–15.9)
LYMPH#: 0.6 10*3/uL — AB (ref 0.9–3.3)
LYMPH%: 7.8 % — ABNORMAL LOW (ref 14.0–49.7)
MCH: 30.4 pg (ref 25.1–34.0)
MCHC: 33.6 g/dL (ref 31.5–36.0)
MCV: 90.6 fL (ref 79.5–101.0)
MONO#: 0.1 10*3/uL (ref 0.1–0.9)
MONO%: 1 % (ref 0.0–14.0)
NEUT%: 91.1 % — ABNORMAL HIGH (ref 38.4–76.8)
NEUTROS ABS: 6.4 10*3/uL (ref 1.5–6.5)
Platelets: 208 10*3/uL (ref 145–400)
RBC: 3.39 10*6/uL — ABNORMAL LOW (ref 3.70–5.45)
RDW: 14.8 % — AB (ref 11.2–14.5)
WBC: 7.1 10*3/uL (ref 3.9–10.3)

## 2016-12-03 LAB — COMPREHENSIVE METABOLIC PANEL
ALBUMIN: 3.6 g/dL (ref 3.5–5.0)
ALK PHOS: 142 U/L (ref 40–150)
ALT: 18 U/L (ref 0–55)
AST: 17 U/L (ref 5–34)
Anion Gap: 12 mEq/L — ABNORMAL HIGH (ref 3–11)
BILIRUBIN TOTAL: 0.35 mg/dL (ref 0.20–1.20)
BUN: 23.5 mg/dL (ref 7.0–26.0)
CO2: 22 mEq/L (ref 22–29)
Calcium: 9.7 mg/dL (ref 8.4–10.4)
Chloride: 106 mEq/L (ref 98–109)
Creatinine: 0.8 mg/dL (ref 0.6–1.1)
EGFR: 72 mL/min/{1.73_m2} — AB (ref >90)
GLUCOSE: 231 mg/dL — AB (ref 70–140)
Potassium: 4.3 mEq/L (ref 3.5–5.1)
SODIUM: 140 meq/L (ref 136–145)
TOTAL PROTEIN: 7 g/dL (ref 6.4–8.3)

## 2016-12-03 MED ORDER — PALONOSETRON HCL INJECTION 0.25 MG/5ML
INTRAVENOUS | Status: AC
  Filled 2016-12-03: qty 5

## 2016-12-03 MED ORDER — DIPHENHYDRAMINE HCL 50 MG/ML IJ SOLN
INTRAMUSCULAR | Status: AC
  Filled 2016-12-03: qty 1

## 2016-12-03 MED ORDER — PEGFILGRASTIM 6 MG/0.6ML ~~LOC~~ PSKT
6.0000 mg | PREFILLED_SYRINGE | Freq: Once | SUBCUTANEOUS | Status: DC
  Filled 2016-12-03: qty 0.6

## 2016-12-03 MED ORDER — SODIUM CHLORIDE 0.9 % IV SOLN
Freq: Once | INTRAVENOUS | Status: AC
  Administered 2016-12-03: 11:00:00 via INTRAVENOUS

## 2016-12-03 MED ORDER — SODIUM CHLORIDE 0.9 % IV SOLN
640.0000 mg | Freq: Once | INTRAVENOUS | Status: AC
  Administered 2016-12-03: 640 mg via INTRAVENOUS
  Filled 2016-12-03: qty 64

## 2016-12-03 MED ORDER — CEPHALEXIN 500 MG PO CAPS: 500.0000 mg | ORAL_CAPSULE | Freq: Three times a day (TID) | ORAL | 0 refills | Status: DC

## 2016-12-03 MED ORDER — FAMOTIDINE IN NACL 20-0.9 MG/50ML-% IV SOLN
20.0000 mg | Freq: Once | INTRAVENOUS | Status: AC
  Administered 2016-12-03: 20 mg via INTRAVENOUS

## 2016-12-03 MED ORDER — SODIUM CHLORIDE 0.9 % IV SOLN
131.2500 mg/m2 | Freq: Once | INTRAVENOUS | Status: AC
  Administered 2016-12-03: 276 mg via INTRAVENOUS
  Filled 2016-12-03: qty 46

## 2016-12-03 MED ORDER — PALONOSETRON HCL INJECTION 0.25 MG/5ML
0.2500 mg | Freq: Once | INTRAVENOUS | Status: AC
  Administered 2016-12-03: 0.25 mg via INTRAVENOUS

## 2016-12-03 MED ORDER — DIPHENHYDRAMINE HCL 50 MG/ML IJ SOLN
50.0000 mg | Freq: Once | INTRAMUSCULAR | Status: AC
  Administered 2016-12-03: 50 mg via INTRAVENOUS

## 2016-12-03 MED ORDER — SODIUM CHLORIDE 0.9 % IV SOLN
Freq: Once | INTRAVENOUS | Status: AC
  Administered 2016-12-03: 11:00:00 via INTRAVENOUS
  Filled 2016-12-03: qty 5

## 2016-12-03 MED ORDER — FAMOTIDINE IN NACL 20-0.9 MG/50ML-% IV SOLN
INTRAVENOUS | Status: AC
  Filled 2016-12-03: qty 50

## 2016-12-03 NOTE — Progress Notes (Signed)
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 Lymph node, sentinel, biopsy, right obturator ONE BENIGN LYMPH NODE (0/1) 3. Lymph nodes, regional resection, left pelvic FOUR BENIGN LYMPH NODES (0/4)      01/02/2016 Surgery    Dr. Denman George performed robotic-assisted laparoscopic total hysterectomy with bilateral salpingoophorectomy, sentinel lymph node biopsy, lymphadenectomy        05/15/2016 Pathology Results    Vagina, biopsy, mid - ADENOCARCINOMA, SEE COMMENT. Microscopic Comment The morphology along with the patient's history are consistent with recurrent endometrioid adenocarcinoma. The carcinoma has a similar appearance to the primary ([id number]).      05/20/2016 Imaging    Ct scan abdomen showed solid 2.5 cm peritoneal mass in the mid to anterior left pelvis, suspicious for peritoneal metastasis. 2. Small expansile low-attenuation filling defect in the left external iliac vein, cannot exclude a small deep venous thrombus. Consider correlation with left lower extremity venous Doppler scan. 3. Small simple fluid density structure in the left pelvic sidewall abutting the left external iliac vessels, favor a small postoperative seroma. 4. No ascites. 5. No lymphadenopathy.  No metastatic disease in the chest. 6. Aortic atherosclerosis.      06/12/2016 PET scan    Intensely hypermetabolic 2.1 cm central pelvic peritoneal mass just to the left of midline, consistent with peritoneal metastatic recurrence.  No ascites. 2. No additional hypermetabolic sites of metastatic disease. 3. Diffuse thyroid hypermetabolism without discrete thyroid nodule, favoring thyroiditis. Recommend correlation with serum thyroid function tests.      06/24/2016 - 07/22/2016 Chemotherapy    The patient had weekly cisplatin. She has missed several doses due to infection and pancytopenia      06/24/2016 - 08/01/2016 Radiation Therapy    She completed concurrent radiation therapy Radiation treatment dates:   IMRT : 06/24/16 - 08/01/16 HDR : 08/13/16, 08/20/16, 08/27/16, 09/04/16  Site/dose:   Pelvis treated to 55 Gy in 25 fractions (simultaneous integrated boost technique) Vaginal Cuff treated to 24 Gy in 4 fractions      09/27/2016 Imaging    Interval decrease in size of previously described solid peritoneal nodule within the left anterior pelvis. Near complete resolution of previously described low-attenuation structure along the left pelvic sidewall. No evidence for metastatic disease in the chest. Aortic atherosclerosis.       INTERVAL HISTORY: Please see below for problem oriented charting. She returns for cycle 6 of treatment. She complain of recent recurrent cellulitis/superficial thrombophlebitis affecting the left forearm She showed me some pictures The area has improved dramatically on doxycycline She just completed antibiotics recently She continues to have peripheral neuropathy especially over the left foot.  She has not started taking morphine sulfate as prescribed She denies recent nausea or vomiting.  REVIEW OF SYSTEMS:   Constitutional: Denies fevers, chills or abnormal weight loss Eyes: Denies blurriness of vision Ears, nose, mouth, throat, and face: Denies mucositis or sore throat Respiratory: Denies cough, dyspnea or wheezes Cardiovascular: Denies palpitation, chest discomfort or lower extremity swelling Gastrointestinal:  Denies nausea, heartburn or change in bowel habits Lymphatics: Denies new  lymphadenopathy  or easy bruising Neurological:Denies numbness, tingling or new weaknesses Behavioral/Psych: Mood is stable, no new changes  All other systems were reviewed with the patient and are negative.  I have reviewed the past medical history, past surgical history, social history and family history with the patient and they are unchanged from previous note.  ALLERGIES:  is allergic to latex.  MEDICATIONS:  Current Outpatient Prescriptions  Medication Sig Dispense Refill  . acetaminophen (TYLENOL) 325 MG tablet Take 650 mg by mouth every 6 (six) hours as needed.    . ALPRAZolam (XANAX) 1 MG tablet Take 1/2 to one tablet at bedtime as needed for insomnia and restless legs.    Marland Kitchen amoxicillin (AMOXIL) 500 MG capsule Take 500 mg by mouth as needed. Prior to dental appointment, takes 4 tables prior to appt    . B Complex Vitamins (VITAMIN B COMPLEX) TABS Take 1 tablet by mouth daily.     . Calcium Carbonate-Vitamin D (CALTRATE COLON HEALTH PO) Take 1 tablet by mouth daily.    . cephALEXin (KEFLEX) 500 MG capsule Take 1 capsule (500 mg total) by mouth 3 (three) times daily. 21 capsule 0  . chlorhexidine (PERIDEX) 0.12 % solution Use as directed 15 mLs in the mouth or throat 2 (two) times daily. 120 mL 0  . clonazePAM (KLONOPIN) 0.5 MG tablet Take 0.5 mg by mouth daily as needed (restless legs).    Marland Kitchen dexamethasone (DECADRON) 4 MG tablet Take 5 tablets with food 12 hrs and 6 hrs prior to Taxol chemotherapy. 10 tablet 6  . gabapentin (NEURONTIN) 600 MG tablet TAKE 1 TABLET BY MOUTH TWICE A DAY 60 tablet 9  . levothyroxine (SYNTHROID, LEVOTHROID) 88 MCG tablet Take 88 mcg by mouth daily.  3  . loperamide (IMODIUM) 2 MG capsule Take by mouth as needed for diarrhea or loose stools.    . magnesium 30 MG tablet Take 30 mg by mouth daily.     . Melatonin 3 MG TBDP Take 3 mg by mouth at bedtime.     . Multiple Vitamin (MULTIVITAMIN WITH MINERALS) TABS tablet Take 1 tablet by mouth daily.    .  Naproxen Sodium 220 MG CAPS Take 1 capsule (220 mg total) by mouth 2 (two) times daily as needed (pain). Hold if reflux. Do not take with decadron. 60 each 0  . oxyCODONE (OXY IR/ROXICODONE) 5 MG immediate release tablet Take 1 tablet (5 mg total) by mouth every 4 (four) hours as needed for severe pain. 10 tablet 0  . Potassium (POTASSIMIN) 75 MG TABS Take 75 mg by mouth daily.    . protein supplement shake (PREMIER PROTEIN) LIQD Take 325 mLs (11 oz total) by mouth 2 (two) times daily between meals. 14 Can 0  . traZODone (DESYREL) 50 MG tablet     . famotidine (PEPCID) 20 MG tablet Take 1 tablet (20 mg total) by mouth 2 (two) times daily as needed for heartburn or indigestion. (Patient not taking: Reported on 09/09/2016) 30 tablet 0  . fluticasone (FLONASE) 50 MCG/ACT nasal spray Place 1 spray into both nostrils 2 (two) times daily as needed.  1  . LORazepam (ATIVAN) 1 MG tablet Place 1 tablet (1 mg total) under the tongue every 8 (eight) hours as needed (nausea). (Patient not taking: Reported on 09/09/2016) 30 tablet 0  . morphine (MSIR) 15 MG tablet Take 1 tablet (15 mg total) by mouth every 4 (four) hours as needed for severe pain. (Patient not taking: Reported on 12/03/2016) 30 tablet  0  . Morphine Sulfate (MORPHINE CONCENTRATE) 10 MG/0.5ML SOLN concentrated solution Take 0.5 mLs (10 mg total) by mouth every 2 (two) hours as needed for severe pain. (Patient not taking: Reported on 09/09/2016) 15 mL 0  . ondansetron (ZOFRAN) 8 MG tablet Take 1 tablet (8 mg total) by mouth every 8 (eight) hours as needed for nausea. (Patient not taking: Reported on 09/09/2016) 60 tablet 1  . prochlorperazine (COMPAZINE) 10 MG tablet Take 1 tablet (10 mg total) by mouth every 6 (six) hours as needed for nausea. (Patient not taking: Reported on 09/09/2016) 20 tablet 0  . traMADol (ULTRAM) 50 MG tablet      No current facility-administered medications for this visit.    Facility-Administered Medications Ordered in Other Visits   Medication Dose Route Frequency Provider Last Rate Last Dose  . CARBOplatin (PARAPLATIN) 640 mg in sodium chloride 0.9 % 250 mL chemo infusion  640 mg Intravenous Once Alvy Bimler, Faven Watterson, MD      . PACLitaxel (TAXOL) 276 mg in sodium chloride 0.9 % 250 mL chemo infusion (> 80mg /m2)  131.25 mg/m2 (Treatment Plan Recorded) Intravenous Once Heath Lark, MD        PHYSICAL EXAMINATION: ECOG PERFORMANCE STATUS: 1 - Symptomatic but completely ambulatory  Vitals:   12/03/16 0947  BP: (!) 141/98  Pulse: 92  Resp: 18  Temp: 98.7 F (37.1 C)   Filed Weights   12/03/16 0947  Weight: 214 lb (97.1 kg)    GENERAL:alert, no distress and comfortable SKIN: skin color, texture, turgor are normal, no rashes or significant lesions.  Noted mild bruising from prior superficial thrombophlebitis EYES: normal, Conjunctiva are pink and non-injected, sclera clear OROPHARYNX:no exudate, no erythema and lips, buccal mucosa, and tongue normal  NECK: supple, thyroid normal size, non-tender, without nodularity LYMPH:  no palpable lymphadenopathy in the cervical, axillary or inguinal LUNGS: clear to auscultation and percussion with normal breathing effort HEART: regular rate & rhythm and no murmurs and no lower extremity edema ABDOMEN:abdomen soft, non-tender and normal bowel sounds Musculoskeletal:no cyanosis of digits and no clubbing  NEURO: alert & oriented x 3 with fluent speech, no focal motor/sensory deficits  LABORATORY DATA:  I have reviewed the data as listed    Component Value Date/Time   NA 140 12/03/2016 0930   K 4.3 12/03/2016 0930   CL 106 08/30/2016 0527   CO2 22 12/03/2016 0930   GLUCOSE 231 (H) 12/03/2016 0930   BUN 23.5 12/03/2016 0930   CREATININE 0.8 12/03/2016 0930   CALCIUM 9.7 12/03/2016 0930   PROT 7.0 12/03/2016 0930   ALBUMIN 3.6 12/03/2016 0930   AST 17 12/03/2016 0930   ALT 18 12/03/2016 0930   ALKPHOS 142 12/03/2016 0930   BILITOT 0.35 12/03/2016 0930   GFRNONAA >60  08/31/2016 0539   GFRAA >60 08/31/2016 0539    No results found for: SPEP, UPEP  Lab Results  Component Value Date   WBC 7.1 12/03/2016   NEUTROABS 6.4 12/03/2016   HGB 10.3 (L) 12/03/2016   HCT 30.7 (L) 12/03/2016   MCV 90.6 12/03/2016   PLT 208 12/03/2016      Chemistry      Component Value Date/Time   NA 140 12/03/2016 0930   K 4.3 12/03/2016 0930   CL 106 08/30/2016 0527   CO2 22 12/03/2016 0930   BUN 23.5 12/03/2016 0930   CREATININE 0.8 12/03/2016 0930      Component Value Date/Time   CALCIUM 9.7 12/03/2016 0930   ALKPHOS 142  12/03/2016 0930   AST 17 12/03/2016 0930   ALT 18 12/03/2016 0930   BILITOT 0.35 12/03/2016 0930       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 I plan to reduce a dose of Taxol but proceed with full dose carboplatin.  She agree with the plan Plan would be to complete 6 cycles of treatment and then repeat imaging study in June  Anemia due to antineoplastic chemotherapy This is likely due to recent treatment. The patient denies recent history of bleeding such as epistaxis, hematuria or hematochezia. She is asymptomatic from the anemia. I will observe for now.    Peripheral neuropathy due to chemotherapy [facility]) she has mild peripheral neuropathy, likely related to side effects of treatment. I plan to reduce the dose of treatment as outlined above.  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 She just completed a dose of doxycycline recently The areas of superficial thrombophlebitis/cellulitis has improved We will  proceed with treatment Due to her frequent episode of recurrent infection, she will receive G-CSF support with each chemo and I gave her prescription of Keflex to take as needed   No orders of the defined types were placed in this encounter.  All questions were answered. The patient knows to call the clinic with any problems, questions or concerns. No barriers to learning was detected. I spent 15 minutes counseling the patient face to face. The total time spent in the appointment was 20 minutes and more than 50% was on counseling and review of test results     Heath Lark, MD 12/03/2016 11:49 AM

## 2016-12-03 NOTE — Assessment & Plan Note (Signed)
she has mild peripheral neuropathy, likely related to side effects of treatment. I plan to reduce the dose of treatment as outlined above.  I explained to the patient the rationale of this strategy and reassured the patient it would not compromise the efficacy of treatment I recommend increasing gabapentin to 3 times a day along with pain medicine as needed

## 2016-12-03 NOTE — Assessment & Plan Note (Signed)
The total chemotherapy treatment is to get 6 cycles of carboplatin and Taxol She is has started to experience some trouble some peripheral neuropathy. I plan to reduce a dose of Taxol but proceed with full dose carboplatin.  She agree with the plan Plan would be to complete 6 cycles of treatment and then repeat imaging study in June

## 2016-12-03 NOTE — Patient Instructions (Signed)
Cancer Center Discharge Instructions for Patients Receiving Chemotherapy  Today you received the following chemotherapy agents Taxol and Carboplatin. To help prevent nausea and vomiting after your treatment, we encourage you to take your nausea medication as directed.  If you develop nausea and vomiting that is not controlled by your nausea medication, call the clinic.   BELOW ARE SYMPTOMS THAT SHOULD BE REPORTED IMMEDIATELY:  *FEVER GREATER THAN 100.5 F  *CHILLS WITH OR WITHOUT FEVER  NAUSEA AND VOMITING THAT IS NOT CONTROLLED WITH YOUR NAUSEA MEDICATION  *UNUSUAL SHORTNESS OF BREATH  *UNUSUAL BRUISING OR BLEEDING  TENDERNESS IN MOUTH AND THROAT WITH OR WITHOUT PRESENCE OF ULCERS  *URINARY PROBLEMS  *BOWEL PROBLEMS  UNUSUAL RASH Items with * indicate a potential emergency and should be followed up as soon as possible.  Feel free to call the clinic you have any questions or concerns. The clinic phone number is [phone].  Please show the CHEMO ALERT CARD at check-in to the Emergency Department and triage nurse.    

## 2016-12-03 NOTE — Assessment & Plan Note (Signed)
The patient has frequent recurrent thrombophlebitis and cellulitis. She just completed a dose of doxycycline recently The areas of superficial thrombophlebitis/cellulitis has improved We will proceed with treatment Due to her frequent episode of recurrent infection, she will receive G-CSF support with each chemo and I gave her prescription of Keflex to take as needed

## 2016-12-03 NOTE — Assessment & Plan Note (Signed)
This is likely due to recent treatment. The patient denies recent history of bleeding such as epistaxis, hematuria or hematochezia. She is asymptomatic from the anemia. I will observe for now.   

## 2016-12-04 ENCOUNTER — Ambulatory Visit (HOSPITAL_BASED_OUTPATIENT_CLINIC_OR_DEPARTMENT_OTHER): Payer: Medicare Other

## 2016-12-04 VITALS — BP 137/78 | HR 89 | Temp 98.1°F | Resp 18

## 2016-12-04 DIAGNOSIS — Z5189 Encounter for other specified aftercare: Secondary | ICD-10-CM

## 2016-12-04 DIAGNOSIS — C541 Malignant neoplasm of endometrium: Secondary | ICD-10-CM | POA: Diagnosis present

## 2016-12-04 MED ORDER — PEGFILGRASTIM INJECTION 6 MG/0.6ML ~~LOC~~
6.0000 mg | PREFILLED_SYRINGE | Freq: Once | SUBCUTANEOUS | Status: AC
  Administered 2016-12-04: 6 mg via SUBCUTANEOUS
  Filled 2016-12-04: qty 0.6

## 2016-12-04 NOTE — Patient Instructions (Signed)
Pegfilgrastim injection What is this medicine? PEGFILGRASTIM (PEG fil gra stim) is a long-acting granulocyte colony-stimulating factor that stimulates the growth of neutrophils, a type of white blood cell important in the body's fight against infection. It is used to reduce the incidence of fever and infection in patients with certain types of cancer who are receiving chemotherapy that affects the bone marrow, and to increase survival after being exposed to high doses of radiation. This medicine may be used for other purposes; ask your health care provider or pharmacist if you have questions. COMMON BRAND NAME(S): Neulasta What should I tell my health care provider before I take this medicine? They need to know if you have any of these conditions: -kidney disease -latex allergy -ongoing radiation therapy -sickle cell disease -skin reactions to acrylic adhesives (On-Body Injector only) -an unusual or allergic reaction to pegfilgrastim, filgrastim, other medicines, foods, dyes, or preservatives -pregnant or trying to get pregnant -breast-feeding How should I use this medicine? This medicine is for injection under the skin. If you get this medicine at home, you will be taught how to prepare and give the pre-filled syringe or how to use the On-body Injector. Refer to the patient Instructions for Use for detailed instructions. Use exactly as directed. Tell your healthcare provider immediately if you suspect that the On-body Injector may not have performed as intended or if you suspect the use of the On-body Injector resulted in a missed or partial dose. It is important that you put your used needles and syringes in a special sharps container. Do not put them in a trash can. If you do not have a sharps container, call your pharmacist or healthcare provider to get one. Talk to your pediatrician regarding the use of this medicine in children. While this drug may be prescribed for selected conditions,  precautions do apply. Overdosage: If you think you have taken too much of this medicine contact a poison control center or emergency room at once. NOTE: This medicine is only for you. Do not share this medicine with others. What if I miss a dose? It is important not to miss your dose. Call your doctor or health care professional if you miss your dose. If you miss a dose due to an On-body Injector failure or leakage, a new dose should be administered as soon as possible using a single prefilled syringe for manual use. What may interact with this medicine? Interactions have not been studied. Give your health care provider a list of all the medicines, herbs, non-prescription drugs, or dietary supplements you use. Also tell them if you smoke, drink alcohol, or use illegal drugs. Some items may interact with your medicine. This list may not describe all possible interactions. Give your health care provider a list of all the medicines, herbs, non-prescription drugs, or dietary supplements you use. Also tell them if you smoke, drink alcohol, or use illegal drugs. Some items may interact with your medicine. What should I watch for while using this medicine? You may need blood work done while you are taking this medicine. If you are going to need a MRI, CT scan, or other procedure, tell your doctor that you are using this medicine (On-Body Injector only). What side effects may I notice from receiving this medicine? Side effects that you should report to your doctor or health care professional as soon as possible: -allergic reactions like skin rash, itching or hives, swelling of the face, lips, or tongue -dizziness -fever -pain, redness, or irritation at site   where injected -pinpoint red spots on the skin -red or dark-brown urine -shortness of breath or breathing problems -stomach or side pain, or pain at the shoulder -swelling -tiredness -trouble passing urine or change in the amount of urine Side  effects that usually do not require medical attention (report to your doctor or health care professional if they continue or are bothersome): -bone pain -muscle pain This list may not describe all possible side effects. Call your doctor for medical advice about side effects. You may report side effects to FDA at 1-800-FDA-1088. Where should I keep my medicine? Keep out of the reach of children. Store pre-filled syringes in a refrigerator between 2 and 8 degrees C (36 and 46 degrees F). Do not freeze. Keep in carton to protect from light. Throw away this medicine if it is left out of the refrigerator for more than 48 hours. Throw away any unused medicine after the expiration date. NOTE: This sheet is a summary. It may not cover all possible information. If you have questions about this medicine, talk to your doctor, pharmacist, or health care provider.  2018 [employer]vier/Gold Standard (2016-06-20 12:58:03)  

## 2016-12-18 ENCOUNTER — Telehealth: Payer: Self-pay

## 2016-12-18 NOTE — Telephone Encounter (Signed)
Radiology Scheduler to call and schedule CT scan,

## 2016-12-30 ENCOUNTER — Other Ambulatory Visit (HOSPITAL_BASED_OUTPATIENT_CLINIC_OR_DEPARTMENT_OTHER): Payer: Medicare Other

## 2016-12-30 ENCOUNTER — Encounter (HOSPITAL_COMMUNITY): Payer: Self-pay

## 2016-12-30 ENCOUNTER — Encounter (HOSPITAL_COMMUNITY)
Admission: RE | Admit: 2016-12-30 | Discharge: 2016-12-30 | Disposition: A | Discharge status: Home / Self Care | Payer: Medicare Other | Source: Ambulatory Visit | Attending: Hematology and Oncology | Admitting: Hematology and Oncology

## 2016-12-30 DIAGNOSIS — C541 Malignant neoplasm of endometrium: Secondary | ICD-10-CM | POA: Insufficient documentation

## 2016-12-30 LAB — CBC WITH DIFFERENTIAL/PLATELET
BASO%: 0.9 % (ref 0.0–2.0)
Basophils Absolute: 0 10*3/uL (ref 0.0–0.1)
EOS ABS: 0.3 10*3/uL (ref 0.0–0.5)
EOS%: 7.8 % — AB (ref 0.0–7.0)
HCT: 30.2 % — ABNORMAL LOW (ref 34.8–46.6)
HEMOGLOBIN: 10.3 g/dL — AB (ref 11.6–15.9)
LYMPH%: 20.6 % (ref 14.0–49.7)
MCH: 31.1 pg (ref 25.1–34.0)
MCHC: 34 g/dL (ref 31.5–36.0)
MCV: 91.4 fL (ref 79.5–101.0)
MONO#: 0.6 10*3/uL (ref 0.1–0.9)
MONO%: 14.4 % — AB (ref 0.0–14.0)
NEUT#: 2.4 10*3/uL (ref 1.5–6.5)
NEUT%: 56.3 % (ref 38.4–76.8)
Platelets: 182 10*3/uL (ref 145–400)
RBC: 3.31 10*6/uL — AB (ref 3.70–5.45)
RDW: 15.4 % — AB (ref 11.2–14.5)
WBC: 4.2 10*3/uL (ref 3.9–10.3)
lymph#: 0.9 10*3/uL (ref 0.9–3.3)

## 2016-12-30 LAB — COMPREHENSIVE METABOLIC PANEL
ALBUMIN: 3.3 g/dL — AB (ref 3.5–5.0)
ALT: 18 U/L (ref 0–55)
AST: 20 U/L (ref 5–34)
Alkaline Phosphatase: 100 U/L (ref 40–150)
Anion Gap: 9 mEq/L (ref 3–11)
BILIRUBIN TOTAL: 0.51 mg/dL (ref 0.20–1.20)
BUN: 15.8 mg/dL (ref 7.0–26.0)
CO2: 28 meq/L (ref 22–29)
Calcium: 9.3 mg/dL (ref 8.4–10.4)
Chloride: 106 mEq/L (ref 98–109)
Creatinine: 0.7 mg/dL (ref 0.6–1.1)
EGFR: 84 mL/min/{1.73_m2} — AB (ref >90)
GLUCOSE: 108 mg/dL (ref 70–140)
Potassium: 3.3 mEq/L — ABNORMAL LOW (ref 3.5–5.1)
SODIUM: 143 meq/L (ref 136–145)
TOTAL PROTEIN: 6.1 g/dL — AB (ref 6.4–8.3)

## 2016-12-30 MED ORDER — IOPAMIDOL (ISOVUE-300) INJECTION 61%
INTRAVENOUS | Status: AC
  Filled 2016-12-30: qty 100

## 2016-12-30 MED ORDER — IOPAMIDOL (ISOVUE-300) INJECTION 61%
100.0000 mL | Freq: Once | INTRAVENOUS | Status: AC | PRN
  Administered 2016-12-30: 100 mL via INTRAVENOUS

## 2016-12-31 ENCOUNTER — Encounter: Payer: Self-pay | Admitting: Hematology and Oncology

## 2016-12-31 ENCOUNTER — Telehealth: Payer: Self-pay | Admitting: Hematology and Oncology

## 2016-12-31 ENCOUNTER — Ambulatory Visit (HOSPITAL_BASED_OUTPATIENT_CLINIC_OR_DEPARTMENT_OTHER): Payer: Medicare Other | Admitting: Hematology and Oncology

## 2016-12-31 VITALS — BP 167/79 | HR 80 | Temp 98.6°F | Resp 18 | Wt 213.7 lb

## 2016-12-31 DIAGNOSIS — C541 Malignant neoplasm of endometrium: Secondary | ICD-10-CM

## 2016-12-31 DIAGNOSIS — D61818 Other pancytopenia: Secondary | ICD-10-CM | POA: Diagnosis not present

## 2016-12-31 DIAGNOSIS — I10 Essential (primary) hypertension: Secondary | ICD-10-CM

## 2016-12-31 DIAGNOSIS — G620 Drug-induced polyneuropathy: Secondary | ICD-10-CM | POA: Diagnosis not present

## 2016-12-31 DIAGNOSIS — T451X5A Adverse effect of antineoplastic and immunosuppressive drugs, initial encounter: Secondary | ICD-10-CM

## 2016-12-31 MED ORDER — CLONAZEPAM 0.5 MG PO TABS
0.5000 mg | ORAL_TABLET | Freq: Every day | ORAL | 1 refills | Status: DC | PRN
Start: 2016-12-31 — End: 2017-12-08

## 2016-12-31 NOTE — Telephone Encounter (Signed)
Scheduled appt per 6/26 los - Gave patient AVS an calender per los.

## 2017-01-01 NOTE — Assessment & Plan Note (Signed)
She has completed all adjuvant treatment. CT scan show no evidence of residual disease Recommend close monitoring with history, physical examination and blood work and repeat imaging study in a few months.

## 2017-01-01 NOTE — Assessment & Plan Note (Signed)
Her blood pressure is elevated. Could be due to whitecoat hypertension. I recommend close monitoring only. 

## 2017-01-01 NOTE — Progress Notes (Signed)
[place] OFFICE PROGRESS NOTE  Patient Care Team: [name], MD as PCP - General (Family Medicine)  SUMMARY OF ONCOLOGIC HISTORY:   Recurrent carcinoma of endometrium ([place])   01/02/2016 Pathology Results    Uterus +/- tubes/ovaries, neoplastic, cervix ENDOMETRIAL ADENOCARCINOMA, FIGO GRADE 2 (4.7 CM) THE TUMOR INVADES LESS THAN ONE-HALF OF THE MYOMETRIUM (PT1A) ALL MARGINS OF RESECTION ARE NEGATIVE FOR CARCINOMA LEIOMYOMAS AND ADENOMYOSIS BILATERAL FALLOPIAN TUBES AND OVARIES: HISTOLOGICAL UNREMARKABLE 2. Lymph node, sentinel, biopsy, right obturator ONE BENIGN LYMPH NODE (0/1) 3. Lymph nodes, regional resection, left pelvic FOUR BENIGN LYMPH NODES (0/4)      01/02/2016 Surgery    Dr. Denman George performed robotic-assisted laparoscopic total hysterectomy with bilateral salpingoophorectomy, sentinel lymph node biopsy, lymphadenectomy        05/15/2016 Pathology Results    Vagina, biopsy, mid - ADENOCARCINOMA, SEE COMMENT. Microscopic Comment The morphology along with the patient's history are consistent with recurrent endometrioid adenocarcinoma. The carcinoma has a similar appearance to the primary ([id number]).      05/20/2016 Imaging    Ct scan abdomen showed solid 2.5 cm peritoneal mass in the mid to anterior left pelvis, suspicious for peritoneal metastasis. 2. Small expansile low-attenuation filling defect in the left external iliac vein, cannot exclude a small deep venous thrombus. Consider correlation with left lower extremity venous Doppler scan. 3. Small simple fluid density structure in the left pelvic sidewall abutting the left external iliac vessels, favor a small postoperative seroma. 4. No ascites. 5. No lymphadenopathy.  No metastatic disease in the chest. 6. Aortic atherosclerosis.      06/12/2016 PET scan    Intensely hypermetabolic 2.1 cm central pelvic peritoneal mass just to the left of midline, consistent with peritoneal metastatic recurrence.  No ascites. 2. No additional hypermetabolic sites of metastatic disease. 3. Diffuse thyroid hypermetabolism without discrete thyroid nodule, favoring thyroiditis. Recommend correlation with serum thyroid function tests.      06/24/2016 - 07/22/2016 Chemotherapy    The patient had weekly cisplatin. She has missed several doses due to infection and pancytopenia      06/24/2016 - 08/01/2016 Radiation Therapy    She completed concurrent radiation therapy Radiation treatment dates:   IMRT : 06/24/16 - 08/01/16 HDR : 08/13/16, 08/20/16, 08/27/16, 09/04/16  Site/dose:   Pelvis treated to 55 Gy in 25 fractions (simultaneous integrated boost technique) Vaginal Cuff treated to 24 Gy in 4 fractions      08/15/2016 - 12/03/2016 Chemotherapy    She received 6 cycles of carboplatin/Taxol      09/27/2016 Imaging    Interval decrease in size of previously described solid peritoneal nodule within the left anterior pelvis. Near complete resolution of previously described low-attenuation structure along the left pelvic sidewall. No evidence for metastatic disease in the chest. Aortic atherosclerosis.      12/30/2016 Imaging    Ct abdomen 1. Solitary left pelvic peritoneal implant is mildly decreased in size in the interval. 2. No new or progressive metastatic disease in the abdomen or pelvis. No ascites. 3. Aortic atherosclerosis.       INTERVAL HISTORY: Please see below for problem oriented charting. She returns for further follow-up. She denies recent infection. Peripheral neuropathy is stable and mildly improved. She denies recent infection. The patient denies any recent signs or symptoms of bleeding such as spontaneous epistaxis, hematuria or hematochezia.   REVIEW OF SYSTEMS:   Constitutional: Denies fevers, chills or abnormal weight loss Eyes: Denies blurriness of vision Ears, nose, mouth, throat,  and face: Denies mucositis or sore throat Respiratory: Denies cough, dyspnea or  wheezes Cardiovascular: Denies palpitation, chest discomfort or lower extremity swelling Gastrointestinal:  Denies nausea, heartburn or change in bowel habits Skin: Denies abnormal skin rashes Lymphatics: Denies new lymphadenopathy or easy bruising Neurological:Denies numbness, tingling or new weaknesses Behavioral/Psych: Mood is stable, no new changes  All other systems were reviewed with the patient and are negative.  I have reviewed the past medical history, past surgical history, social history and family history with the patient and they are unchanged from previous note.  ALLERGIES:  is allergic to latex.  MEDICATIONS:  Current Outpatient Prescriptions  Medication Sig Dispense Refill  . acetaminophen (TYLENOL) 325 MG tablet Take 650 mg by mouth every 6 (six) hours as needed.    . ALPRAZolam (XANAX) 1 MG tablet Take 1/2 to one tablet at bedtime as needed for insomnia and restless legs.    Marland Kitchen amoxicillin (AMOXIL) 500 MG capsule Take 500 mg by mouth as needed. Prior to dental appointment, takes 4 tables prior to appt    . B Complex Vitamins (VITAMIN B COMPLEX) TABS Take 1 tablet by mouth daily.     . Calcium Carbonate-Vitamin D (CALTRATE COLON HEALTH PO) Take 1 tablet by mouth daily.    . chlorhexidine (PERIDEX) 0.12 % solution Use as directed 15 mLs in the mouth or throat 2 (two) times daily. 120 mL 0  . clonazePAM (KLONOPIN) 0.5 MG tablet Take 1 tablet (0.5 mg total) by mouth daily as needed (restless legs). 60 tablet 1  . fluticasone (FLONASE) 50 MCG/ACT nasal spray Place 1 spray into both nostrils 2 (two) times daily as needed.  1  . gabapentin (NEURONTIN) 600 MG tablet TAKE 1 TABLET BY MOUTH TWICE A DAY 60 tablet 9  . levothyroxine (SYNTHROID, LEVOTHROID) 88 MCG tablet Take 88 mcg by mouth daily.  3  . magnesium 30 MG tablet Take 30 mg by mouth daily.     . Melatonin 3 MG TBDP Take 3 mg by mouth at bedtime.     . Multiple Vitamin (MULTIVITAMIN WITH MINERALS) TABS tablet Take 1  tablet by mouth daily.    . ondansetron (ZOFRAN) 8 MG tablet Take 1 tablet (8 mg total) by mouth every 8 (eight) hours as needed for nausea. (Patient not taking: Reported on 09/09/2016) 60 tablet 1  . prochlorperazine (COMPAZINE) 10 MG tablet Take 1 tablet (10 mg total) by mouth every 6 (six) hours as needed for nausea. (Patient not taking: Reported on 09/09/2016) 20 tablet 0  . protein supplement shake (PREMIER PROTEIN) LIQD Take 325 mLs (11 oz total) by mouth 2 (two) times daily between meals. 14 Can 0  . traZODone (DESYREL) 50 MG tablet      No current facility-administered medications for this visit.     PHYSICAL EXAMINATION: ECOG PERFORMANCE STATUS: 1 - Symptomatic but completely ambulatory  Vitals:   12/31/16 1327  BP: (!) 167/79  Pulse: 80  Resp: 18  Temp: 98.6 F (37 C)   Filed Weights   12/31/16 1327  Weight: 213 lb 11.2 oz (96.9 kg)    GENERAL:alert, no distress and comfortable SKIN: skin color, texture, turgor are normal, no rashes or significant lesions EYES: normal, Conjunctiva are pink and non-injected, sclera clear OROPHARYNX:no exudate, no erythema and lips, buccal mucosa, and tongue normal  NECK: supple, thyroid normal size, non-tender, without nodularity LYMPH:  no palpable lymphadenopathy in the cervical, axillary or inguinal LUNGS: clear to auscultation and percussion with normal breathing  effort HEART: regular rate & rhythm and no murmurs and no lower extremity edema ABDOMEN:abdomen soft, non-tender and normal bowel sounds Musculoskeletal:no cyanosis of digits and no clubbing  NEURO: alert & oriented x 3 with fluent speech, no focal motor/sensory deficits  LABORATORY DATA:  I have reviewed the data as listed    Component Value Date/Time   NA 143 12/30/2016 1022   K 3.3 (L) 12/30/2016 1022   CL 106 08/30/2016 0527   CO2 28 12/30/2016 1022   GLUCOSE 108 12/30/2016 1022   BUN 15.8 12/30/2016 1022   CREATININE 0.7 12/30/2016 1022   CALCIUM 9.3 12/30/2016  1022   PROT 6.1 (L) 12/30/2016 1022   ALBUMIN 3.3 (L) 12/30/2016 1022   AST 20 12/30/2016 1022   ALT 18 12/30/2016 1022   ALKPHOS 100 12/30/2016 1022   BILITOT 0.51 12/30/2016 1022   GFRNONAA >60 08/31/2016 0539   GFRAA >60 08/31/2016 0539    No results found for: SPEP, UPEP  Lab Results  Component Value Date   WBC 4.2 12/30/2016   NEUTROABS 2.4 12/30/2016   HGB 10.3 (L) 12/30/2016   HCT 30.2 (L) 12/30/2016   MCV 91.4 12/30/2016   PLT 182 12/30/2016      Chemistry      Component Value Date/Time   NA 143 12/30/2016 1022   K 3.3 (L) 12/30/2016 1022   CL 106 08/30/2016 0527   CO2 28 12/30/2016 1022   BUN 15.8 12/30/2016 1022   CREATININE 0.7 12/30/2016 1022      Component Value Date/Time   CALCIUM 9.3 12/30/2016 1022   ALKPHOS 100 12/30/2016 1022   AST 20 12/30/2016 1022   ALT 18 12/30/2016 1022   BILITOT 0.51 12/30/2016 1022       RADIOGRAPHIC STUDIES: I have personally reviewed the radiological images as listed and agreed with the findings in the report. Ct Abdomen Pelvis W Contrast  Result Date: 12/30/2016 CLINICAL DATA:  Re- stage recurrent endometrial cancer. EXAM: CT ABDOMEN AND PELVIS WITH CONTRAST TECHNIQUE: Multidetector CT imaging of the abdomen and pelvis was performed using the standard protocol following bolus administration of intravenous contrast. CONTRAST:  166mL ISOVUE-300 IOPAMIDOL (ISOVUE-300) INJECTION 61% COMPARISON:  09/27/2016 CT abdomen/ pelvis.  06/12/2016 PET-CT. FINDINGS: Lower chest: No significant pulmonary nodules or acute consolidative airspace disease. Hepatobiliary: Normal liver size. Hypodense subcentimeter right liver lobe lesion is too small to characterize and not appreciably changed in size back to 05/20/2016, most compatible with a benign lesion. No new liver lesions. Normal gallbladder with no radiopaque cholelithiasis. No biliary ductal dilatation. Pancreas: Normal, with no mass or duct dilation. Spleen: Normal size. No mass.  Adrenals/Urinary Tract: Normal adrenals. Normal kidneys with no hydronephrosis and no renal mass. Collapsed and grossly normal bladder. Stomach/Bowel: Grossly normal stomach. Normal caliber small bowel with no small bowel wall thickening. Normal appendix. Normal large bowel with no diverticulosis, large bowel wall thickening or pericolonic fat stranding. Vascular/Lymphatic: Atherosclerotic nonaneurysmal abdominal aorta. Patent portal, splenic, hepatic and renal veins. No pathologically enlarged lymph nodes in the abdomen or pelvis. Reproductive: Status post hysterectomy. No masses or fluid collections at the vaginal cuff. Left pelvic 0.8 x 0.8 cm peritoneal nodule (series 2/image 63), previously 1.1 x 0.9 cm on 09/27/2016 using similar measurement technique, mildly decreased in size. No adnexal masses or new peritoneal nodules. Other: No pneumoperitoneum, ascites or focal fluid collection. Musculoskeletal: No aggressive appearing focal osseous lesions. Moderate to marked thoracolumbar spondylosis. IMPRESSION: 1. Solitary left pelvic peritoneal implant is mildly decreased in  size in the interval. 2. No new or progressive metastatic disease in the abdomen or pelvis. No ascites. 3. Aortic atherosclerosis. Electronically Signed   By: Ilona Sorrel M.D.   On: 12/30/2016 15:20    ASSESSMENT & PLAN:  Recurrent carcinoma of endometrium [facility]) She has completed all adjuvant treatment. CT scan show no evidence of residual disease Recommend close monitoring with history, physical examination and blood work and repeat imaging study in a few months.  Pancytopenia, acquired [facility]) She has mild persistent pancytopenia from treatment. She is not symptomatic. Recommend close observation only.  Essential hypertension Her blood pressure is elevated. Could be due to whitecoat hypertension. I recommend close monitoring only.  Peripheral neuropathy due to chemotherapy [facility]) she has mild peripheral neuropathy, likely  related to side effects of treatment. It is mild.  It does not bother her. She will continue gabapentin.  Hopefully, in time, it will improve.   Orders Placed This Encounter  Procedures  . CT ABDOMEN PELVIS W CONTRAST    Standing Status:   Future    Standing Expiration Date:   04/02/2018    Order Specific Question:   Reason for Exam (SYMPTOM  OR DIAGNOSIS REQUIRED)    Answer:   staging endometrial ca, assess for progression    Order Specific Question:   Preferred imaging location?    Answer:   [facility]   All questions were answered. The patient knows to call the clinic with any problems, questions or concerns. No barriers to learning was detected. I spent 15 minutes counseling the patient face to face. The total time spent in the appointment was 20 minutes and more than 50% was on counseling and review of test results     Heath Lark, MD 01/01/2017 3:55 PM

## 2017-01-01 NOTE — Assessment & Plan Note (Signed)
she has mild peripheral neuropathy, likely related to side effects of treatment. It is mild.  It does not bother her. She will continue gabapentin.  Hopefully, in time, it will improve.

## 2017-01-01 NOTE — Assessment & Plan Note (Signed)
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 She is not symptomatic. Recommend close observation only. 

## 2017-02-24 ENCOUNTER — Telehealth: Payer: Self-pay | Admitting: *Deleted

## 2017-02-24 NOTE — Telephone Encounter (Signed)
Pt reports feet and toes hurt, numb and tingling. Was only left foot, but has now gone to right foot as well. Cannot feel toes. Has been using gabapentin 600 mg twice a day. Thought Dr Alvy Bimler had told her to increase to three times a day, but she has not started doing that yet.. "that's my fault for not doing what she told me"

## 2017-02-24 NOTE — Telephone Encounter (Signed)
I recommend increase to 600 MG TID Rest as much as possible until it gets a bit better OK to take 1000 mg tylenol TID for 2-3 days in the meantime

## 2017-02-24 NOTE — Telephone Encounter (Signed)
Notified of message below. Verbalized understanding 

## 2017-04-01 ENCOUNTER — Encounter: Payer: Self-pay | Admitting: Oncology

## 2017-04-02 ENCOUNTER — Encounter (HOSPITAL_COMMUNITY): Payer: Self-pay

## 2017-04-02 ENCOUNTER — Ambulatory Visit (HOSPITAL_COMMUNITY)
Admission: RE | Admit: 2017-04-02 | Discharge: 2017-04-02 | Disposition: A | Discharge status: Home / Self Care | Payer: Medicare Other | Source: Ambulatory Visit | Attending: Hematology and Oncology | Admitting: Hematology and Oncology

## 2017-04-02 ENCOUNTER — Other Ambulatory Visit (HOSPITAL_BASED_OUTPATIENT_CLINIC_OR_DEPARTMENT_OTHER): Payer: Medicare Other

## 2017-04-02 DIAGNOSIS — I700 Atherosclerosis of aorta: Secondary | ICD-10-CM | POA: Insufficient documentation

## 2017-04-02 DIAGNOSIS — C541 Malignant neoplasm of endometrium: Secondary | ICD-10-CM

## 2017-04-02 LAB — COMPREHENSIVE METABOLIC PANEL
ALT: 18 U/L (ref 0–55)
AST: 23 U/L (ref 5–34)
Albumin: 3.6 g/dL (ref 3.5–5.0)
Alkaline Phosphatase: 114 U/L (ref 40–150)
Anion Gap: 8 mEq/L (ref 3–11)
BUN: 30.1 mg/dL — ABNORMAL HIGH (ref 7.0–26.0)
CHLORIDE: 103 meq/L (ref 98–109)
CO2: 29 meq/L (ref 22–29)
Calcium: 9.9 mg/dL (ref 8.4–10.4)
Creatinine: 0.8 mg/dL (ref 0.6–1.1)
EGFR: 72 mL/min/{1.73_m2} — AB (ref >90)
GLUCOSE: 87 mg/dL (ref 70–140)
POTASSIUM: 4.7 meq/L (ref 3.5–5.1)
SODIUM: 140 meq/L (ref 136–145)
Total Bilirubin: 0.34 mg/dL (ref 0.20–1.20)
Total Protein: 7.3 g/dL (ref 6.4–8.3)

## 2017-04-02 LAB — CBC WITH DIFFERENTIAL/PLATELET
BASO%: 0.3 % (ref 0.0–2.0)
BASOS ABS: 0 10*3/uL (ref 0.0–0.1)
EOS ABS: 0.3 10*3/uL (ref 0.0–0.5)
EOS%: 3.9 % (ref 0.0–7.0)
HCT: 36.2 % (ref 34.8–46.6)
HGB: 11.9 g/dL (ref 11.6–15.9)
LYMPH%: 14.3 % (ref 14.0–49.7)
MCH: 28.3 pg (ref 25.1–34.0)
MCHC: 32.9 g/dL (ref 31.5–36.0)
MCV: 86 fL (ref 79.5–101.0)
MONO#: 0.6 10*3/uL (ref 0.1–0.9)
MONO%: 8 % (ref 0.0–14.0)
NEUT%: 73.5 % (ref 38.4–76.8)
NEUTROS ABS: 5.5 10*3/uL (ref 1.5–6.5)
Platelets: 166 10*3/uL (ref 145–400)
RBC: 4.21 10*6/uL (ref 3.70–5.45)
RDW: 13.2 % (ref 11.2–14.5)
WBC: 7.5 10*3/uL (ref 3.9–10.3)
lymph#: 1.1 10*3/uL (ref 0.9–3.3)

## 2017-04-02 MED ORDER — IOPAMIDOL (ISOVUE-300) INJECTION 61%
INTRAVENOUS | Status: AC
  Filled 2017-04-02: qty 100

## 2017-04-02 MED ORDER — IOPAMIDOL (ISOVUE-300) INJECTION 61%
100.0000 mL | Freq: Once | INTRAVENOUS | Status: AC | PRN
  Administered 2017-04-02: 100 mL via INTRAVENOUS

## 2017-04-03 ENCOUNTER — Ambulatory Visit
Admission: RE | Admit: 2017-04-03 | Discharge: 2017-04-03 | Disposition: A | Discharge status: Home / Self Care | Payer: Medicare Other | Source: Ambulatory Visit | Attending: Radiation Oncology | Admitting: Radiation Oncology

## 2017-04-03 ENCOUNTER — Encounter: Payer: Self-pay | Admitting: Gynecologic Oncology

## 2017-04-03 ENCOUNTER — Ambulatory Visit: Payer: Medicare Other | Admitting: Hematology and Oncology

## 2017-04-03 ENCOUNTER — Encounter: Payer: Self-pay | Admitting: Radiation Oncology

## 2017-04-03 ENCOUNTER — Ambulatory Visit: Payer: Medicare Other | Attending: Gynecologic Oncology | Admitting: Gynecologic Oncology

## 2017-04-03 VITALS — BP 143/78 | HR 69 | Temp 98.6°F | Resp 18 | Ht 66.0 in | Wt 212.9 lb

## 2017-04-03 VITALS — BP 133/83 | HR 69 | Temp 98.8°F | Ht 66.0 in | Wt 213.4 lb

## 2017-04-03 DIAGNOSIS — Z923 Personal history of irradiation: Secondary | ICD-10-CM | POA: Insufficient documentation

## 2017-04-03 DIAGNOSIS — Z800 Family history of malignant neoplasm of digestive organs: Secondary | ICD-10-CM | POA: Insufficient documentation

## 2017-04-03 DIAGNOSIS — Z96652 Presence of left artificial knee joint: Secondary | ICD-10-CM | POA: Insufficient documentation

## 2017-04-03 DIAGNOSIS — I10 Essential (primary) hypertension: Secondary | ICD-10-CM | POA: Diagnosis not present

## 2017-04-03 DIAGNOSIS — Z8052 Family history of malignant neoplasm of bladder: Secondary | ICD-10-CM | POA: Insufficient documentation

## 2017-04-03 DIAGNOSIS — Z91040 Latex allergy status: Secondary | ICD-10-CM | POA: Insufficient documentation

## 2017-04-03 DIAGNOSIS — Z7951 Long term (current) use of inhaled steroids: Secondary | ICD-10-CM | POA: Diagnosis not present

## 2017-04-03 DIAGNOSIS — Z79899 Other long term (current) drug therapy: Secondary | ICD-10-CM | POA: Insufficient documentation

## 2017-04-03 DIAGNOSIS — Z9221 Personal history of antineoplastic chemotherapy: Secondary | ICD-10-CM | POA: Insufficient documentation

## 2017-04-03 DIAGNOSIS — E039 Hypothyroidism, unspecified: Secondary | ICD-10-CM | POA: Insufficient documentation

## 2017-04-03 DIAGNOSIS — Z90722 Acquired absence of ovaries, bilateral: Secondary | ICD-10-CM | POA: Insufficient documentation

## 2017-04-03 DIAGNOSIS — C541 Malignant neoplasm of endometrium: Secondary | ICD-10-CM | POA: Insufficient documentation

## 2017-04-03 DIAGNOSIS — C7989 Secondary malignant neoplasm of other specified sites: Secondary | ICD-10-CM

## 2017-04-03 DIAGNOSIS — Z833 Family history of diabetes mellitus: Secondary | ICD-10-CM | POA: Insufficient documentation

## 2017-04-03 DIAGNOSIS — Z8049 Family history of malignant neoplasm of other genital organs: Secondary | ICD-10-CM | POA: Diagnosis not present

## 2017-04-03 DIAGNOSIS — Z90710 Acquired absence of both cervix and uterus: Secondary | ICD-10-CM | POA: Insufficient documentation

## 2017-04-03 NOTE — Progress Notes (Signed)
GYN ONCOLOGY OFFICE VISIT    Michelle Rosario 71 y.o. female  Chief Complaint  Patient presents with  . Endometrial cancer [facility])    Assessment :Solitary site of progressive recurrent endometrial carcinoma   Plan: PET scan to evaluate for other lesions. If there is only evidence of oligo metastases will strongly consider robotic resection of this isolated recurrence with the plan for adjuvant chemotherapy.  If multiple other lesions are appreciated then will determine the best chemotherapy plan. Strong consideration for single agent Doxil or letrozole combined with evilorimus     HPI:    Oncology History   The patient is a 71 year old G1P1 who was initially seen in consultation at the request of Dr Michelle Rosario for grade 2 endometrial cancer. A transvaginal ultrasound scan was performed on 09/11/2015. It revealed a uterus measuring 7.2 x 6 x 4.5 cm with a thickened endometrial stripe of 29 mm. The ovaries were normal bilaterally. 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     Recurrent carcinoma of endometrium ([place])   01/02/2016 Pathology Results    Uterus +/- tubes/ovaries, neoplastic, cervix ENDOMETRIAL ADENOCARCINOMA, FIGO GRADE 2 (4.7 CM) THE TUMOR INVADES LESS THAN ONE-HALF OF THE MYOMETRIUM (PT1A) ALL MARGINS OF RESECTION ARE NEGATIVE FOR CARCINOMA LEIOMYOMAS AND ADENOMYOSIS BILATERAL FALLOPIAN TUBES AND OVARIES: HISTOLOGICAL UNREMARKABLE 2. Lymph node, sentinel, biopsy, right obturator ONE BENIGN LYMPH NODE (0/1) 3. Lymph nodes, regional resection, left pelvic FOUR BENIGN LYMPH NODES (0/4)      01/02/2016 Surgery    Dr. Denman George performed robotic-assisted laparoscopic total hysterectomy with bilateral salpingoophorectomy, sentinel lymph node biopsy, lymphadenectomy        05/15/2016 Pathology Results    Vagina, biopsy, mid - ADENOCARCINOMA, SEE COMMENT. Microscopic Comment The morphology along with  the patient's history are consistent with recurrent endometrioid adenocarcinoma. The carcinoma has a similar appearance to the primary ([id number]).      05/20/2016 Imaging    Ct scan abdomen showed solid 2.5 cm peritoneal mass in the mid to anterior left pelvis, suspicious for peritoneal metastasis. 2. Small expansile low-attenuation filling defect in the left external iliac vein, cannot exclude a small deep venous thrombus. Consider correlation with left lower extremity venous Doppler scan. 3. Small simple fluid density structure in the left pelvic sidewall abutting the left external iliac vessels, favor a small postoperative seroma. 4. No ascites. 5. No lymphadenopathy.  No metastatic disease in the chest. 6. Aortic atherosclerosis.      06/12/2016 PET scan    Intensely hypermetabolic 2.1 cm central pelvic peritoneal mass just to the left of midline, consistent with peritoneal metastatic recurrence. No ascites. 2. No additional hypermetabolic sites of metastatic disease. 3. Diffuse thyroid hypermetabolism without discrete thyroid nodule, favoring thyroiditis. Recommend correlation with serum thyroid function tests.      06/24/2016 - 07/22/2016 Chemotherapy    The patient had weekly cisplatin. She has missed several doses due to infection and pancytopenia      06/24/2016 - 08/01/2016 Radiation Therapy    She completed concurrent radiation therapy Radiation treatment dates:   IMRT : 06/24/16 - 08/01/16 HDR : 08/13/16, 08/20/16, 08/27/16, 09/04/16  Site/dose:   Pelvis treated to 55 Gy in 25 fractions (simultaneous integrated boost technique) Vaginal Cuff treated to 24 Gy in 4 fractions      08/15/2016 - 12/03/2016 Chemotherapy    She received 6 cycles of carboplatin/Taxol      09/27/2016 Imaging    Interval decrease  in size of previously described solid peritoneal nodule within the left anterior pelvis. Near complete resolution of previously described low-attenuation structure along the left  pelvic sidewall. No evidence for metastatic disease in the chest. Aortic atherosclerosis.      12/30/2016 Imaging    Ct abdomen 1. Solitary left pelvic peritoneal implant is mildly decreased in size in the interval. 2. No new or progressive metastatic disease in the abdomen or pelvis. No ascites. 3. Aortic atherosclerosis.      04/02/2017 Imaging    Left lower quadrant peritoneal implant referenced on previous exam measures 1.7 x 1.3 cm, image 69 of series 2. Increased from 0.8 x 0.8 cm previously peer no new peritoneal implants identified.  Musculoskeletal: The degenerative disc disease noted within the lumbar spine.  IMPRESSION: 1. Solitary left pelvic peritoneal implant is increased in size in the interval. 2. No new sites of disease.  No ascites. 3. Aortic atherosclerosis     MSI stable   Review of Systems:10 point review of systems is negative except as noted in interval history.   Vitals: Blood pressure (!) 143/78, pulse 69, temperature 98.6 F (37 C), temperature source Oral, resp. rate 18, height '5\' 6"'  (1.676 m), weight 212 lb 14.4 oz (96.6 kg), SpO2 99 %. Body mass index is 34.36 kg/m.   Physical Exam: General : The patient is a healthy woman in no acute distress.  HEENT: normocephalic, extraoccular movements normal; neck is supple without thyromegally         Allergies  Allergen Reactions  . Latex Itching    Past Medical History:  Diagnosis Date  . Broken ribs    hx of  . Family history of adverse reaction to anesthesia    mother post op N&V  . History of radiation therapy 06/24/16-08/01/16 and 08/13/16, 08/20/16, 08/27/16, 09/04/16   pelvis treated to 55 Gy in 25 fractions, vaginal cuff treated to 24 Gy in 4 fractions  . Hypertension   . Hypothyroidism   . Uterine cancer [facility])    May 30 , 2017    Past Surgical History:  Procedure Laterality Date  . achellis tendon repaired in 2005    . ANKLE SURGERY Right Oct 2006  . fisure  2014  . LYMPH NODE  BIOPSY N/A 01/02/2016   Procedure: SENTINAL LYMPH NODE BIOPSY;  Surgeon: Everitt Amber, MD;  Location: [facility];  Service: Gynecology;  Laterality: N/A;  . rectal fissure surgery 2010    . REPLACEMENT TOTAL KNEE Left 2015  . ROBOTIC ASSISTED TOTAL HYSTERECTOMY WITH BILATERAL SALPINGO OOPHERECTOMY Bilateral 01/02/2016   Procedure: XI ROBOTIC ASSISTED TOTAL LAPAROSCOPIC HYSTERECTOMY WITH BILATERAL SALPINGO OOPHORECTOMY;  Surgeon: Everitt Amber, MD;  Location: [facility];  Service: Gynecology;  Laterality: Bilateral;  . TUBAL LIGATION      Current Outpatient Prescriptions  Medication Sig Dispense Refill  . acetaminophen (TYLENOL) 325 MG tablet Take 650 mg by mouth every 6 (six) hours as needed.    . ALPRAZolam (XANAX) 1 MG tablet Take 1/2 to one tablet at bedtime as needed for insomnia and restless legs.    Marland Kitchen amoxicillin (AMOXIL) 500 MG capsule Take 500 mg by mouth as needed. Prior to dental appointment, takes 4 tables prior to appt    . B Complex Vitamins (VITAMIN B COMPLEX) TABS Take 1 tablet by mouth daily.     . Calcium Carbonate-Vitamin D (CALTRATE COLON HEALTH PO) Take 1 tablet by mouth daily.    . chlorhexidine (PERIDEX) 0.12 % solution Use as directed 15  mLs in the mouth or throat 2 (two) times daily. (Patient not taking: Reported on 04/03/2017) 120 mL 0  . clonazePAM (KLONOPIN) 0.5 MG tablet Take 1 tablet (0.5 mg total) by mouth daily as needed (restless legs). 60 tablet 1  . fluticasone (FLONASE) 50 MCG/ACT nasal spray Place 1 spray into both nostrils 2 (two) times daily as needed.  1  . gabapentin (NEURONTIN) 600 MG tablet TAKE 1 TABLET BY MOUTH TWICE A DAY (Patient taking differently: Take 1 tablet 3 times a day) 60 tablet 9  . levothyroxine (SYNTHROID, LEVOTHROID) 88 MCG tablet Take 88 mcg by mouth daily.  3  . losartan-hydrochlorothiazide (HYZAAR) 100-12.5 MG tablet Take 1/2 tablet daily for hypertension.    . magnesium 30 MG tablet Take 30 mg by mouth daily.     . Melatonin 3 MG TBDP Take 3 mg by  mouth at bedtime.     . Multiple Vitamin (MULTIVITAMIN WITH MINERALS) TABS tablet Take 1 tablet by mouth daily.    . ondansetron (ZOFRAN) 8 MG tablet Take 1 tablet (8 mg total) by mouth every 8 (eight) hours as needed for nausea. (Patient not taking: Reported on 09/09/2016) 60 tablet 1  . prochlorperazine (COMPAZINE) 10 MG tablet Take 1 tablet (10 mg total) by mouth every 6 (six) hours as needed for nausea. (Patient not taking: Reported on 09/09/2016) 20 tablet 0  . traZODone (DESYREL) 50 MG tablet      No current facility-administered medications for this visit.     Social History   Social History  . Marital status: Divorced    Spouse name: N/A  . Number of children: 1  . Years of education: N/A   Occupational History  . retired Sales executive    Social History Main Topics  . Smoking status: Never Smoker  . Smokeless tobacco: Never Used  . Alcohol use Yes     Comment: socially  . Drug use: No  . Sexual activity: No   Other Topics Concern  . Not on file   Social History Narrative  . No narrative on file    Family History  Problem Relation Age of Onset  . Anesthesia problems Mother   . Uterine cancer Mother 90  . Bladder Cancer Father 53  . Diabetes Sister   . Rectal cancer Cousin 80       d.69      Janie Morning, MD 04/03/2017, 12:50 PM

## 2017-04-03 NOTE — Patient Instructions (Signed)
Plan on having a PET scan.  We will call you with the results.  We will also order additional testing on your surgery specimen.  Plan to follow up after the PET to discuss results.

## 2017-04-03 NOTE — Progress Notes (Signed)
Radiation Oncology         [phone] ________________________________  Name: Michelle Rosario MRN: [id number]  Date: 04/03/2017  DOB: Jan 26, 1946  Follow-Up Visit Note  CC: [name], MD  Marti Sleigh    ICD-10-CM   1. 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 woman with FIGO Stage IA (pT1a, pN0) Grade 2 endometrial adenocarcinoma, now with vaginal and pelvic recurrence.  Interval Since Last Radiation:  9 months   06/24/16 - 08/01/16 : Pelvis treated to 55 Gy in 25 fractions. Vaginal Cuff treated to 24 Gy in 4 fractions.  Narrative:  Michelle Rosario is here for follow up.  She denies having any pain, urinary/bowel issues or vaginal bleeding/discharge.  She reports having a good appetite and energy level.  She said she does get tired easily.  She is using a vaginal dilator, denies any pain or vaginal bleeding. No cramping or diarhhea. She finished chemotherapy 3 months ago, in late July. She had a CT scan yesterday (04/02/17), results showed that a lymph node (peritoneal nodule) has increased in size, but everything else was good.                     ALLERGIES:  is allergic to latex.  Meds: Current Outpatient Prescriptions  Medication Sig Dispense Refill  . acetaminophen (TYLENOL) 325 MG tablet Take 650 mg by mouth every 6 (six) hours as needed.    . B Complex Vitamins (VITAMIN B COMPLEX) TABS Take 1 tablet by mouth daily.     . Calcium Carbonate-Vitamin D (CALTRATE COLON HEALTH PO) Take 1 tablet by mouth daily.    . clonazePAM (KLONOPIN) 0.5 MG tablet Take 1 tablet (0.5 mg total) by mouth daily as needed (restless legs). 60 tablet 1  . fluticasone (FLONASE) 50 MCG/ACT nasal spray Place 1 spray into both nostrils 2 (two) times daily as needed.  1  . gabapentin (NEURONTIN) 600 MG tablet TAKE 1 TABLET BY MOUTH TWICE A DAY (Patient taking differently: Take 1 tablet 3 times a day) 60 tablet 9  . levothyroxine (SYNTHROID, LEVOTHROID) 88 MCG tablet Take  88 mcg by mouth daily.  3  . losartan-hydrochlorothiazide (HYZAAR) 100-12.5 MG tablet Take 1/2 tablet daily for hypertension.    . magnesium 30 MG tablet Take 30 mg by mouth daily.     . Melatonin 3 MG TBDP Take 3 mg by mouth at bedtime.     . Multiple Vitamin (MULTIVITAMIN WITH MINERALS) TABS tablet Take 1 tablet by mouth daily.    . traZODone (DESYREL) 50 MG tablet     . ALPRAZolam (XANAX) 1 MG tablet Take 1/2 to one tablet at bedtime as needed for insomnia and restless legs.    Marland Kitchen amoxicillin (AMOXIL) 500 MG capsule Take 500 mg by mouth as needed. Prior to dental appointment, takes 4 tables prior to appt    . chlorhexidine (PERIDEX) 0.12 % solution Use as directed 15 mLs in the mouth or throat 2 (two) times daily. 120 mL 0  . ondansetron (ZOFRAN) 8 MG tablet Take 1 tablet (8 mg total) by mouth every 8 (eight) hours as needed for nausea. 60 tablet 1  . prochlorperazine (COMPAZINE) 10 MG tablet Take 1 tablet (10 mg total) by mouth every 6 (six) hours as needed for nausea. 20 tablet 0   No current facility-administered medications for this encounter.     Physical Findings: The patient is in no acute distress. Patient is  alert and oriented.  height is 5\' 6"  (1.676 m) and weight is 213 lb 6.4 oz (96.8 kg). Her oral temperature is 98.8 F (37.1 C). Her blood pressure is 133/83 and her pulse is 69. Her oxygen saturation is 99%. .    Wt Readings from Last 3 Encounters:  04/03/17 213 lb 6.4 oz (96.8 kg)  12/31/16 213 lb 11.2 oz (96.9 kg)  12/03/16 214 lb (97.1 kg)   Lungs are clear to auscultation bilaterally. Heart has regular rate and rhythm. No palpable cervical, supraclavicular, or axillary adenopathy. Abdomen soft, non-tender, normal bowel sounds.  Pelvic exam not preformed today, patient will be seeing Dr. Skeet Latch in a few minutes.  Lab Findings: Lab Results  Component Value Date   WBC 7.5 04/02/2017   HGB 11.9 04/02/2017   HCT 36.2 04/02/2017   MCV 86.0 04/02/2017   PLT 166  04/02/2017    Radiographic Findings: Ct Abdomen Pelvis W Contrast  Result Date: 04/02/2017 CLINICAL DATA:  Staging endometrial carcinoma. Assess for progression of disease. EXAM: CT ABDOMEN AND PELVIS WITH CONTRAST TECHNIQUE: Multidetector CT imaging of the abdomen and pelvis was performed using the standard protocol following bolus administration of intravenous contrast. CONTRAST:  135mL ISOVUE-300 IOPAMIDOL (ISOVUE-300) INJECTION 61% COMPARISON:  12/30/2016 FINDINGS: Lower chest: The lung bases are clear. No pleural or pericardial effusion identified. Hepatobiliary: Stable 8 mm low-attenuation structure in the right lobe of liver, image 15 of series 2. The gallbladder appears normal. No biliary dilatation. Pancreas: The pancreas is unremarkable. Spleen: Normal appearance of the spleen. Adrenals/Urinary Tract: Normal appearance of the adrenal glands. The kidneys are both unremarkable. No hydronephrosis or mass. There is mild diffuse bladder wall thickening, similar to previous exam. Stomach/Bowel: The stomach appears normal. The small bowel loops have a normal course and caliber. The appendix is visualized and appears normal. Unremarkable appearance of the colon Vascular/Lymphatic: Aortic atherosclerosis. No aneurysm. No upper abdominal adenopathy. No pelvic or inguinal adenopathy. Reproductive: Status post hysterectomy. No adnexal masses. Other: Left lower quadrant peritoneal implant referenced on previous exam measures 1.7 x 1.3 cm, image 69 of series 2. Increased from 0.8 x 0.8 cm previously peer no new peritoneal implants identified. Musculoskeletal: The degenerative disc disease noted within the lumbar spine. IMPRESSION: 1. Solitary left pelvic peritoneal implant is increased in size in the interval. 2. No new sites of disease.  No ascites. 3. Aortic atherosclerosis. Electronically Signed   By: Kerby Moors M.D.   On: 04/02/2017 14:32    Impression:   Clinically stable, since radiation and  completion of chemotherapy. Patient's  CT scan from yesterday, however does show enlargement of the soliarity left peritoneal implant (1.7cm by 1.3cm).  I  Carefully reviewed the patients radiation field from earlier this year, and this area of progression was included in her high dose radiation fields. Therefore she is not a candidate for additional radiation in this region. She may need PET for further evaluate this area and to rule out other areas of reoccurence, possible biopsy. Not sure if she is a candidate for additional or different type of chemotherapy   Plan: Routine follow up in three months with Radiation Oncology, but this may change depending on upcoming therapy recommendations by Dr. Skeet Latch.  ____________________________________  Blair Promise, PhD, MD  This document serves as a record of services personally performed by Gery Pray MD. It was created on his behalf by Delton Coombes, a trained medical scribe. The creation of this record is based on the scribe's personal  observations and the provider's statements to them. This document has been checked and approved by the attending provider.

## 2017-04-03 NOTE — Progress Notes (Addendum)
Michelle Rosario is here for follow up.  She denies having any pain, urinary/bowel issues or vaginal bleeding/discharge.  She reports having a good appetite and energy level.  She said she does get tired easily.  She is using a vaginal dilator.  BP 133/83 (BP Location: Left Arm, Patient Position: Sitting)   Pulse 69   Temp 98.8 F (37.1 C) (Oral)   Ht 5\' 6"  (1.676 m)   Wt 213 lb 6.4 oz (96.8 kg)   SpO2 99%   BMI 34.44 kg/m    Wt Readings from Last 3 Encounters:  04/03/17 213 lb 6.4 oz (96.8 kg)  12/31/16 213 lb 11.2 oz (96.9 kg)  12/03/16 214 lb (97.1 kg)

## 2017-04-04 NOTE — Addendum Note (Signed)
Encounter addended by: Jacqulyn Liner, RN on: 04/04/2017 10:02 AM<BR>    Actions taken: Charge Capture section accepted

## 2017-04-11 ENCOUNTER — Ambulatory Visit (HOSPITAL_COMMUNITY)
Admission: RE | Admit: 2017-04-11 | Discharge: 2017-04-11 | Disposition: A | Discharge status: Home / Self Care | Payer: Medicare Other | Source: Ambulatory Visit | Attending: Gynecologic Oncology | Admitting: Gynecologic Oncology

## 2017-04-11 ENCOUNTER — Telehealth: Payer: Self-pay | Admitting: Gynecologic Oncology

## 2017-04-11 DIAGNOSIS — C541 Malignant neoplasm of endometrium: Secondary | ICD-10-CM

## 2017-04-11 DIAGNOSIS — R9389 Abnormal findings on diagnostic imaging of other specified body structures: Secondary | ICD-10-CM | POA: Insufficient documentation

## 2017-04-11 LAB — GLUCOSE, CAPILLARY: GLUCOSE-CAPILLARY: 92 mg/dL (ref 65–99)

## 2017-04-11 MED ORDER — FLUDEOXYGLUCOSE F - 18 (FDG) INJECTION
11.7000 | Freq: Once | INTRAVENOUS | Status: AC | PRN
  Administered 2017-04-11: 11.7 via INTRAVENOUS

## 2017-04-11 NOTE — Telephone Encounter (Signed)
Patient informed of PET scan results.  To follow up on Thurs.

## 2017-04-17 ENCOUNTER — Ambulatory Visit: Payer: Medicare Other | Attending: Gynecologic Oncology | Admitting: Gynecologic Oncology

## 2017-04-17 ENCOUNTER — Encounter: Payer: Self-pay | Admitting: Gynecologic Oncology

## 2017-04-17 VITALS — BP 158/72 | HR 72 | Temp 98.0°F | Resp 18 | Wt 217.4 lb

## 2017-04-17 DIAGNOSIS — Z79899 Other long term (current) drug therapy: Secondary | ICD-10-CM | POA: Insufficient documentation

## 2017-04-17 DIAGNOSIS — C541 Malignant neoplasm of endometrium: Secondary | ICD-10-CM

## 2017-04-17 DIAGNOSIS — I10 Essential (primary) hypertension: Secondary | ICD-10-CM | POA: Diagnosis not present

## 2017-04-17 DIAGNOSIS — Z90710 Acquired absence of both cervix and uterus: Secondary | ICD-10-CM | POA: Diagnosis not present

## 2017-04-17 DIAGNOSIS — C786 Secondary malignant neoplasm of retroperitoneum and peritoneum: Secondary | ICD-10-CM

## 2017-04-17 DIAGNOSIS — E039 Hypothyroidism, unspecified: Secondary | ICD-10-CM | POA: Insufficient documentation

## 2017-04-17 NOTE — Progress Notes (Signed)
GYN ONCOLOGY OFFICE VISIT    Michelle Rosario 71 y.o. female  Chief Complaint  Patient presents with  . recurrent endometrial cancer    Assessment :Solitary site of persistent recurrent endometrial carcinoma   Plan: Offered her robotic resection of this isolated recurrence with the plan for salvage adjuvant chemotherapy.Strong consideration for single agent Doxil or letrozole combined with evilorimus .  I discussed surgical route. We may need to convert to laparotomy if there is difficulty finding the lesion or if bowel resection is necessary. She is at increased risk of surgical complications (particularly healing from bowel resection) given her prior radiation. There is risk for leak or stricture. There is risk for stoma formation.   HPI:    Oncology History   The patient is a 70 year old G1P1 who was initially seen in consultation at the request of Dr Fogleman for grade 2 endometrial cancer. A transvaginal ultrasound scan was performed on 09/11/2015. It revealed a uterus measuring 7.2 x 6 x 4.5 cm with a thickened endometrial stripe of 29 mm. The ovaries were normal bilaterally. 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  MSI stable     Recurrent carcinoma of endometrium (HCC)   01/02/2016 Pathology Results    Uterus +/- tubes/ovaries, neoplastic, cervix ENDOMETRIAL ADENOCARCINOMA, FIGO GRADE 2 (4.7 CM) THE TUMOR INVADES LESS THAN ONE-HALF OF THE MYOMETRIUM (PT1A) ALL MARGINS OF RESECTION ARE NEGATIVE FOR CARCINOMA LEIOMYOMAS AND ADENOMYOSIS BILATERAL FALLOPIAN TUBES AND OVARIES: HISTOLOGICAL UNREMARKABLE 2. Lymph node, sentinel, biopsy, right obturator ONE BENIGN LYMPH NODE (0/1) 3. Lymph nodes, regional resection, left pelvic FOUR BENIGN LYMPH NODES (0/4)      01/02/2016 Surgery    Dr. Rossi performed robotic-assisted laparoscopic total hysterectomy with bilateral salpingoophorectomy, sentinel  lymph node biopsy, lymphadenectomy        05/15/2016 Pathology Results    Vagina, biopsy, mid - ADENOCARCINOMA, SEE COMMENT. Microscopic Comment The morphology along with the patient's history are consistent with recurrent endometrioid adenocarcinoma. The carcinoma has a similar appearance to the primary ([id number]).      05/20/2016 Imaging    Ct scan abdomen showed solid 2.5 cm peritoneal mass in the mid to anterior left pelvis, suspicious for peritoneal metastasis. 2. Small expansile low-attenuation filling defect in the left external iliac vein, cannot exclude a small deep venous thrombus. Consider correlation with left lower extremity venous Doppler scan. 3. Small simple fluid density structure in the left pelvic sidewall abutting the left external iliac vessels, favor a small postoperative seroma. 4. No ascites. 5. No lymphadenopathy.  No metastatic disease in the chest. 6. Aortic atherosclerosis.      06/12/2016 PET scan    Intensely hypermetabolic 2.1 cm central pelvic peritoneal mass just to the left of midline, consistent with peritoneal metastatic recurrence. No ascites. 2. No additional hypermetabolic sites of metastatic disease. 3. Diffuse thyroid hypermetabolism without discrete thyroid nodule, favoring thyroiditis. Recommend correlation with serum thyroid function tests.      06/24/2016 - 07/22/2016 Chemotherapy    The patient had weekly cisplatin. She has missed several doses due to infection and pancytopenia      06/24/2016 - 08/01/2016 Radiation Therapy    She completed concurrent radiation therapy Radiation treatment dates:   IMRT : 06/24/16 - 08/01/16 HDR : 08/13/16, 08/20/16, 08/27/16, 09/04/16  Site/dose:   Pelvis treated to 55 Gy in 25 fractions (simultaneous integrated boost technique) Vaginal Cuff treated to 24 Gy in 4 fractions        08/15/2016 - 12/03/2016 Chemotherapy    She received 6 cycles of carboplatin/Taxol      09/27/2016 Imaging    Interval decrease in  size of previously described solid peritoneal nodule within the left anterior pelvis. Near complete resolution of previously described low-attenuation structure along the left pelvic sidewall. No evidence for metastatic disease in the chest. Aortic atherosclerosis.      12/30/2016 Imaging    Ct abdomen 1. Solitary left pelvic peritoneal implant is mildly decreased in size in the interval. 2. No new or progressive metastatic disease in the abdomen or pelvis. No ascites. 3. Aortic atherosclerosis.      04/02/2017 Imaging    Left lower quadrant peritoneal implant referenced on previous exam measures 1.7 x 1.3 cm, image 69 of series 2. Increased from 0.8 x 0.8 cm previously peer no new peritoneal implants identified.  Musculoskeletal: The degenerative disc disease noted within the lumbar spine.  IMPRESSION: 1. Solitary left pelvic peritoneal implant is increased in size in the interval. 2. No new sites of disease.  No ascites. 3. Aortic atherosclerosis     MSI stable  PET/CT 41/7/40: hypermetabolic lymph nodes in the left side of the peritoneal pelvis measuring 1.9cm. Decreased SUV max and size from prior. No other sites of disease.  Review of Systems:10 point review of systems is negative except as noted in interval history.   Vitals: Blood pressure (!) 158/72, pulse 72, temperature 98 F (36.7 C), temperature source Oral, resp. rate 18, weight 217 lb 6.4 oz (98.6 kg), SpO2 99 %. Body mass index is 35.09 kg/m.   Physical Exam: General : The patient is a healthy woman in no acute distress.  HEENT: normocephalic, extraoccular movements normal; neck is supple without thyromegally         Allergies  Allergen Reactions  . Latex Itching    Past Medical History:  Diagnosis Date  . Broken ribs    hx of  . Family history of adverse reaction to anesthesia    mother post op N&V  . History of radiation therapy 06/24/16-08/01/16 and 08/13/16, 08/20/16, 08/27/16, 09/04/16   pelvis  treated to 55 Gy in 25 fractions, vaginal cuff treated to 24 Gy in 4 fractions  . Hypertension   . Hypothyroidism   . Uterine cancer [facility])    May 30 , 2017    Past Surgical History:  Procedure Laterality Date  . achellis tendon repaired in 2005    . ANKLE SURGERY Right Oct 2006  . fisure  2014  . LYMPH NODE BIOPSY N/A 01/02/2016   Procedure: SENTINAL LYMPH NODE BIOPSY;  Surgeon: Everitt Amber, MD;  Location: [facility];  Service: Gynecology;  Laterality: N/A;  . rectal fissure surgery 2010    . REPLACEMENT TOTAL KNEE Left 2015  . ROBOTIC ASSISTED TOTAL HYSTERECTOMY WITH BILATERAL SALPINGO OOPHERECTOMY Bilateral 01/02/2016   Procedure: XI ROBOTIC ASSISTED TOTAL LAPAROSCOPIC HYSTERECTOMY WITH BILATERAL SALPINGO OOPHORECTOMY;  Surgeon: Everitt Amber, MD;  Location: [facility];  Service: Gynecology;  Laterality: Bilateral;  . TUBAL LIGATION      Current Outpatient Prescriptions  Medication Sig Dispense Refill  . acetaminophen (TYLENOL) 325 MG tablet Take 650 mg by mouth every 6 (six) hours as needed.    . ALPRAZolam (XANAX) 1 MG tablet Take 1/2 to one tablet at bedtime as needed for insomnia and restless legs.    Marland Kitchen amoxicillin (AMOXIL) 500 MG capsule Take 500 mg by mouth as needed. Prior to dental appointment, takes 4 tables prior to appt    .  B Complex Vitamins (VITAMIN B COMPLEX) TABS Take 1 tablet by mouth daily.     . Calcium Carbonate-Vitamin D (CALTRATE COLON HEALTH PO) Take 1 tablet by mouth daily.    . chlorhexidine (PERIDEX) 0.12 % solution Use as directed 15 mLs in the mouth or throat 2 (two) times daily. 120 mL 0  . clonazePAM (KLONOPIN) 0.5 MG tablet Take 1 tablet (0.5 mg total) by mouth daily as needed (restless legs). 60 tablet 1  . fluticasone (FLONASE) 50 MCG/ACT nasal spray Place 1 spray into both nostrils 2 (two) times daily as needed.  1  . gabapentin (NEURONTIN) 600 MG tablet TAKE 1 TABLET BY MOUTH TWICE A DAY (Patient taking differently: Take 1 tablet 3 times a day) 60 tablet 9  .  levothyroxine (SYNTHROID, LEVOTHROID) 88 MCG tablet Take 88 mcg by mouth daily.  3  . losartan-hydrochlorothiazide (HYZAAR) 100-12.5 MG tablet Take 1/2 tablet daily for hypertension.    . magnesium 30 MG tablet Take 30 mg by mouth daily.     . Melatonin 3 MG TBDP Take 3 mg by mouth at bedtime.     . Multiple Vitamin (MULTIVITAMIN WITH MINERALS) TABS tablet Take 1 tablet by mouth daily.    . ondansetron (ZOFRAN) 8 MG tablet Take 1 tablet (8 mg total) by mouth every 8 (eight) hours as needed for nausea. 60 tablet 1  . prochlorperazine (COMPAZINE) 10 MG tablet Take 1 tablet (10 mg total) by mouth every 6 (six) hours as needed for nausea. 20 tablet 0  . traZODone (DESYREL) 50 MG tablet      No current facility-administered medications for this visit.     Social History   Social History  . Marital status: Divorced    Spouse name: N/A  . Number of children: 1  . Years of education: N/A   Occupational History  . retired postmaster    Social History Main Topics  . Smoking status: Never Smoker  . Smokeless tobacco: Never Used  . Alcohol use Yes     Comment: socially  . Drug use: No  . Sexual activity: No   Other Topics Concern  . Not on file   Social History Narrative  . No narrative on file    Family History  Problem Relation Age of Onset  . Anesthesia problems Mother   . Uterine cancer Mother 80  . Bladder Cancer Father 82  . Diabetes Sister   . Rectal cancer Cousin 69       d.69      Rossi, Emma Caroline, MD 04/17/2017, 5:04 PM       

## 2017-04-17 NOTE — Patient Instructions (Signed)
Preparing for your Surgery  Plan for surgery on November 13 with Dr. Everitt Amber at [facility] will be scheduled for a robotic assisted resection of pelvic mass, possible laparotomy.  Pre-operative Testing -You will receive a phone call from presurgical testing at [facility] to arrange for a pre-operative testing appointment before your surgery.  This appointment normally occurs one to two weeks before your scheduled surgery.   -Bring your insurance card, copy of an advanced directive if applicable, medication list  -At that visit, you will be asked to sign a consent for a possible blood transfusion in case a transfusion becomes necessary during surgery.  The need for a blood transfusion is rare but having consent is a necessary part of your care.     -You should not be taking blood thinners or aspirin at least ten days prior to surgery unless instructed by your surgeon.  Day Before Surgery at [place] will be asked to take in a light diet the day before surgery.  Avoid carbonated beverages.  You will be advised to have nothing to eat or drink after midnight the evening before.    Eat a light diet the day before surgery.  Examples including soups, broths, toast, yogurt, mashed potatoes.  Things to avoid include carbonated beverages (fizzy beverages), raw fruits and raw vegetables, or beans.   If your bowels are filled with gas, your surgeon will have difficulty visualizing your pelvic organs which increases your surgical risks.  Begin drinking two bottles of magnesium citrate the day before surgery starting at 4pm.  Take in only clear liquids at that time.  Your role in recovery Your role is to become active as soon as directed by your doctor, while still giving yourself time to heal.  Rest when you feel tired. You will be asked to do the following in order to speed your recovery:  - Cough and breathe deeply. This helps toclear and expand your lungs  and can prevent pneumonia. You may be given a spirometer to practice deep breathing. A staff member will show you how to use the spirometer. - Do mild physical activity. Walking or moving your legs help your circulation and body functions return to normal. A staff member will help you when you try to walk and will provide you with simple exercises. Do not try to get up or walk alone the first time. - Actively manage your pain. Managing your pain lets you move in comfort. We will ask you to rate your pain on a scale of zero to 10. It is your responsibility to tell your doctor or nurse where and how much you hurt so your pain can be treated.  Special Considerations -If you are diabetic, you may be placed on insulin after surgery to have closer control over your blood sugars to promote healing and recovery.  This does not mean that you will be discharged on insulin.  If applicable, your oral antidiabetics will be resumed when you are tolerating a solid diet.  -Your final pathology results from surgery should be available by the Friday after surgery and the results will be relayed to you when available.  -Dr. Lahoma Crocker is the Surgeon that assists your GYN Oncologist with surgery.  The next day after your surgery you will either see your GYN Oncologist or Dr. Lahoma Crocker.   Blood Transfusion Information WHAT IS A BLOOD TRANSFUSION? A transfusion is the replacement of blood or some of its parts. Blood  is made up of multiple cells which provide different functions.  Red blood cells carry oxygen and are used for blood loss replacement.  White blood cells fight against infection.  Platelets control bleeding.  Plasma helps clot blood.  Other blood products are available for specialized needs, such as hemophilia or other clotting disorders. BEFORE THE TRANSFUSION  Who gives blood for transfusions?   You may be able to donate blood to be used at a later date on yourself (autologous  donation).  Relatives can be asked to donate blood. This is generally not any safer than if you have received blood from a stranger. The same precautions are taken to ensure safety when a relative's blood is donated.  Healthy volunteers who are fully evaluated to make sure their blood is safe. This is blood bank blood. Transfusion therapy is the safest it has ever been in the practice of medicine. Before blood is taken from a donor, a complete history is taken to make sure that person has no history of diseases nor engages in risky social behavior (examples are intravenous drug use or sexual activity with multiple partners). The donor's travel history is screened to minimize risk of transmitting infections, such as malaria. The donated blood is tested for signs of infectious diseases, such as HIV and hepatitis. The blood is then tested to be sure it is compatible with you in order to minimize the chance of a transfusion reaction. If you or a relative donates blood, this is often done in anticipation of surgery and is not appropriate for emergency situations. It takes many days to process the donated blood. RISKS AND COMPLICATIONS Although transfusion therapy is very safe and saves many lives, the main dangers of transfusion include:   Getting an infectious disease.  Developing a transfusion reaction. This is an allergic reaction to something in the blood you were given. Every precaution is taken to prevent this. The decision to have a blood transfusion has been considered carefully by your caregiver before blood is given. Blood is not given unless the benefits outweigh the risks.  Blood Transfusion Information WHAT IS A BLOOD TRANSFUSION? A transfusion is the replacement of blood or some of its parts. Blood is made up of multiple cells which provide different functions.  Red blood cells carry oxygen and are used for blood loss replacement.  White blood cells fight against infection.  Platelets  control bleeding.  Plasma helps clot blood.  Other blood products are available for specialized needs, such as hemophilia or other clotting disorders. BEFORE THE TRANSFUSION  Who gives blood for transfusions?   You may be able to donate blood to be used at a later date on yourself (autologous donation).  Relatives can be asked to donate blood. This is generally not any safer than if you have received blood from a stranger. The same precautions are taken to ensure safety when a relative's blood is donated.  Healthy volunteers who are fully evaluated to make sure their blood is safe. This is blood bank blood. Transfusion therapy is the safest it has ever been in the practice of medicine. Before blood is taken from a donor, a complete history is taken to make sure that person has no history of diseases nor engages in risky social behavior (examples are intravenous drug use or sexual activity with multiple partners). The donor's travel history is screened to minimize risk of transmitting infections, such as malaria. The donated blood is tested for signs of infectious diseases, such as HIV  and hepatitis. The blood is then tested to be sure it is compatible with you in order to minimize the chance of a transfusion reaction. If you or a relative donates blood, this is often done in anticipation of surgery and is not appropriate for emergency situations. It takes many days to process the donated blood. RISKS AND COMPLICATIONS Although transfusion therapy is very safe and saves many lives, the main dangers of transfusion include:   Getting an infectious disease.  Developing a transfusion reaction. This is an allergic reaction to something in the blood you were given. Every precaution is taken to prevent this. The decision to have a blood transfusion has been considered carefully by your caregiver before blood is given. Blood is not given unless the benefits outweigh the risks.

## 2017-04-21 ENCOUNTER — Telehealth: Payer: Self-pay | Admitting: *Deleted

## 2017-04-21 NOTE — Telephone Encounter (Signed)
Pt called requesting a referral [facility] for a second opinion. Appreciates everything Dr Alvy Bimler is doing, would just like second opinion.

## 2017-04-21 NOTE — Telephone Encounter (Signed)
LM on [facility] office with referral for second opinion

## 2017-04-22 ENCOUNTER — Telehealth: Payer: Self-pay | Admitting: *Deleted

## 2017-04-22 NOTE — Telephone Encounter (Signed)
Returned call to [place] at [employer]. LM to call

## 2017-04-22 NOTE — Telephone Encounter (Signed)
Office notes faxed to [place] at [facility]

## 2017-05-01 ENCOUNTER — Telehealth: Payer: Self-pay

## 2017-05-01 NOTE — Telephone Encounter (Signed)
Pt called asking about her referral to [facility]. Explained last note was OV notes were faxed on 10/16.

## 2017-05-07 ENCOUNTER — Telehealth: Payer: Self-pay | Admitting: *Deleted

## 2017-05-07 NOTE — Telephone Encounter (Signed)
Left message for Zack Power at [facility] regarding referral mase on 04/22/17. (P) [phone]  Pt is hoping to have second opinion from [place] prior to surgery

## 2017-05-13 NOTE — Progress Notes (Signed)
04-02-17 (EPIC) CT Abd/Pelvis with Contrast

## 2017-05-13 NOTE — Patient Instructions (Signed)
Michelle Rosario  05/13/2017   Your procedure is scheduled on: 05-20-17   Report to [facility] Main  Entrance Take [place]  Elevators to 3rd floor to  [place] at 11:15 AM.   Call this number if you have problems the morning of surgery [phone]    Remember: ONLY 1 PERSON MAY GO WITH YOU TO SHORT STAY TO GET  READY MORNING OF [name].  Do not eat food or drink liquids :After Midnight. You may have a Clear Liquid Diet from Midnight until 7:45 AM. After 7:45 AM,nothing until after surgery.    Eat a light diet the day before surgery.  Examples including soups, broths, toast, yogurt, mashed potatoes.  Things to avoid include carbonated beverages (fizzy beverages), raw fruits and raw vegetables, or beans.   If your bowels are filled with gas, your surgeon will have difficulty visualizing your pelvic organs which increases your surgical risks.   Take these medicines the morning of surgery with A SIP OF WATER: Levothroid (Synthroid) and Loratadine (Claritin). You may also bring and use your nasal spray as needed.                                 You may not have any metal on your body including hair pins and              piercings  Do not wear jewelry, make-up, lotions, powders or perfumes, deodorant             Do not wear nail polish.  Do not shave  48 hours prior to surgery.         Do not bring valuables to the hospital. [name].  Contacts, dentures or bridgework may not be worn into surgery.      Patients discharged the day of surgery will not be allowed to drive home.  Name and phone number of your driver:  Special Instructions: N/A              Please read over the following fact sheets you were given: _____________________________________________________________________     CLEAR LIQUID DIET   Foods Allowed                                                                     Foods  Excluded  Coffee and tea, regular and decaf                             liquids that you cannot  Plain Jell-O in any flavor                                             see through such as: Fruit ices (not with fruit pulp)  milk, soups, orange juice  Iced Popsicles                                    All solid food Carbonated beverages, regular and diet                                    Cranberry, grape and apple juices Sports drinks like Gatorade Lightly seasoned clear broth or consume(fat free) Sugar, honey syrup  Sample Menu Breakfast                                Lunch                                     Supper Cranberry juice                    Beef broth                            Chicken broth Jell-O                                     Grape juice                           Apple juice Coffee or tea                        Jell-O                                      Popsicle                                                Coffee or tea                        Coffee or tea  _____________________________________________________________________            [facility] - Preparing for Surgery Before surgery, you can play an important role.  Because skin is not sterile, your skin needs to be as free of germs as possible.  You can reduce the number of germs on your skin by washing with CHG (chlorahexidine gluconate) soap before surgery.  CHG is an antiseptic cleaner which kills germs and bonds with the skin to continue killing germs even after washing. Please DO NOT use if you have an allergy to CHG or antibacterial soaps.  If your skin becomes reddened/irritated stop using the CHG and inform your nurse when you arrive at Short Stay. Do not shave (including legs and underarms) for at least 48 hours prior to the first CHG shower.  You may shave your face/neck. Please follow these instructions carefully:  1.  Shower with CHG Soap the night before surgery and  the  morning of Surgery.  2.  If you choose to wash your hair, wash your hair first as usual with your  normal  shampoo.  3.  After you shampoo, rinse your hair and body thoroughly to remove the  shampoo.                           4.  Use CHG as you would any other liquid soap.  You can apply chg directly  to the skin and wash                       Gently with a scrungie or clean washcloth.  5.  Apply the CHG Soap to your body ONLY FROM THE NECK DOWN.   Do not use on face/ open                           Wound or open sores. Avoid contact with eyes, ears mouth and genitals (private parts).                       Wash face,  Genitals (private parts) with your normal soap.             6.  Wash thoroughly, paying special attention to the area where your surgery  will be performed.  7.  Thoroughly rinse your body with warm water from the neck down.  8.  DO NOT shower/wash with your normal soap after using and rinsing off  the CHG Soap.                9.  Pat yourself dry with a clean towel.            10.  Wear clean pajamas.            11.  Place clean sheets on your bed the night of your first shower and do not  sleep with pets. Day of Surgery : Do not apply any lotions/deodorants the morning of surgery.  Please wear clean clothes to the hospital/surgery center.  FAILURE TO FOLLOW THESE INSTRUCTIONS MAY RESULT IN THE CANCELLATION OF YOUR SURGERY PATIENT SIGNATURE_________________________________  NURSE SIGNATURE__________________________________  ________________________________________________________________________   Michelle Rosario  An incentive spirometer is a tool that can help keep your lungs clear and active. This tool measures how well you are filling your lungs with each breath. Taking long deep breaths may help reverse or decrease the chance of developing breathing (pulmonary) problems (especially infection) following:  A long period of time when you are unable to move or be  active. BEFORE THE PROCEDURE   If the spirometer includes an indicator to show your best effort, your nurse or respiratory therapist will set it to a desired goal.  If possible, sit up straight or lean slightly forward. Try not to slouch.  Hold the incentive spirometer in an upright position. INSTRUCTIONS FOR USE  1. Sit on the edge of your bed if possible, or sit up as far as you can in bed or on a chair. 2. Hold the incentive spirometer in an upright position. 3. Breathe out normally. 4. Place the mouthpiece in your mouth and seal your lips tightly around it. 5. Breathe in slowly and as deeply as possible, raising the piston or the ball toward the top of the column. 6. Hold your breath for 3-5 seconds or for as long as possible.  Allow the piston or ball to fall to the bottom of the column. 7. Remove the mouthpiece from your mouth and breathe out normally. 8. Rest for a few seconds and repeat Steps 1 through 7 at least 10 times every 1-2 hours when you are awake. Take your time and take a few normal breaths between deep breaths. 9. The spirometer may include an indicator to show your best effort. Use the indicator as a goal to work toward during each repetition. 10. After each set of 10 deep breaths, practice coughing to be sure your lungs are clear. If you have an incision (the cut made at the time of surgery), support your incision when coughing by placing a pillow or rolled up towels firmly against it. Once you are able to get out of bed, walk around indoors and cough well. You may stop using the incentive spirometer when instructed by your caregiver.  RISKS AND COMPLICATIONS  Take your time so you do not get dizzy or light-headed.  If you are in pain, you may need to take or ask for pain medication before doing incentive spirometry. It is harder to take a deep breath if you are having pain. AFTER USE  Rest and breathe slowly and easily.  It can be helpful to keep track of a log of  your progress. Your caregiver can provide you with a simple table to help with this. If you are using the spirometer at home, follow these instructions: [place] IF:   You are having difficultly using the spirometer.  You have trouble using the spirometer as often as instructed.  Your pain medication is not giving enough relief while using the spirometer.  You develop fever of 100.5 F (38.1 C) or higher. SEEK IMMEDIATE MEDICAL CARE IF:   You cough up bloody sputum that had not been present before.  You develop fever of 102 F (38.9 C) or greater.  You develop worsening pain at or near the incision site. MAKE SURE YOU:   Understand these instructions.  Will watch your condition.  Will get help right away if you are not doing well or get worse. Document Released: 11/04/2006 Document Revised: 09/16/2011 Document Reviewed: 01/05/2007 ExitCare Patient Information 2014 [employer], [place].   ________________________________________________________________________  WHAT IS A BLOOD TRANSFUSION? Blood Transfusion Information  A transfusion is the replacement of blood or some of its parts. Blood is made up of multiple cells which provide different functions.  Red blood cells carry oxygen and are used for blood loss replacement.  White blood cells fight against infection.  Platelets control bleeding.  Plasma helps clot blood.  Other blood products are available for specialized needs, such as hemophilia or other clotting disorders. BEFORE THE TRANSFUSION  Who gives blood for transfusions?   Healthy volunteers who are fully evaluated to make sure their blood is safe. This is blood bank blood. Transfusion therapy is the safest it has ever been in the practice of medicine. Before blood is taken from a donor, a complete history is taken to make sure that person has no history of diseases nor engages in risky social behavior (examples are intravenous drug use or sexual activity  with multiple partners). The donor's travel history is screened to minimize risk of transmitting infections, such as malaria. The donated blood is tested for signs of infectious diseases, such as HIV and hepatitis. The blood is then tested to be sure it is compatible with you in order to minimize the chance of a transfusion  reaction. If you or a relative donates blood, this is often done in anticipation of surgery and is not appropriate for emergency situations. It takes many days to process the donated blood. RISKS AND COMPLICATIONS Although transfusion therapy is very safe and saves many lives, the main dangers of transfusion include:   Getting an infectious disease.  Developing a transfusion reaction. This is an allergic reaction to something in the blood you were given. Every precaution is taken to prevent this. The decision to have a blood transfusion has been considered carefully by your caregiver before blood is given. Blood is not given unless the benefits outweigh the risks. AFTER THE TRANSFUSION  Right after receiving a blood transfusion, you will usually feel much better and more energetic. This is especially true if your red blood cells have gotten low (anemic). The transfusion raises the level of the red blood cells which carry oxygen, and this usually causes an energy increase.  The nurse administering the transfusion will monitor you carefully for complications. HOME CARE INSTRUCTIONS  No special instructions are needed after a transfusion. You may find your energy is better. Speak with your caregiver about any limitations on activity for underlying diseases you may have. SEEK MEDICAL CARE IF:   Your condition is not improving after your transfusion.  You develop redness or irritation at the intravenous (IV) site. SEEK IMMEDIATE MEDICAL CARE IF:  Any of the following symptoms occur over the next 12 hours:  Shaking chills.  You have a temperature by mouth above 102 F (38.9  C), not controlled by medicine.  Chest, back, or muscle pain.  People around you feel you are not acting correctly or are confused.  Shortness of breath or difficulty breathing.  Dizziness and fainting.  You get a rash or develop hives.  You have a decrease in urine output.  Your urine turns a dark color or changes to pink, red, or brown. Any of the following symptoms occur over the next 10 days:  You have a temperature by mouth above 102 F (38.9 C), not controlled by medicine.  Shortness of breath.  Weakness after normal activity.  The white part of the eye turns yellow (jaundice).  You have a decrease in the amount of urine or are urinating less often.  Your urine turns a dark color or changes to pink, red, or brown. Document Released: 06/21/2000 Document Revised: 09/16/2011 Document Reviewed: 02/08/2008 [facility] Patient Information 2014 [place], [place].  _______________________________________________________________________

## 2017-05-14 ENCOUNTER — Telehealth: Payer: Self-pay | Admitting: Gynecologic Oncology

## 2017-05-14 ENCOUNTER — Telehealth: Payer: Self-pay

## 2017-05-14 NOTE — Telephone Encounter (Signed)
She called and left message that she was canceling her surgery with Dr. Denman George. Called Dr. Denman George office she had already notified them that she was canceling.

## 2017-05-14 NOTE — Telephone Encounter (Signed)
Returned call to patient.  She is stating she went to [facility] yesterday and she would like to cancel her surgery and meet with Dr. Denman George in the office to discuss options.  Verified once again with her that she would like to cancel her surgery.  Patient stating yes.  Appt made with Dr. Denman George.  No concerns voiced.  Advised to call for any further concerns.

## 2017-05-15 ENCOUNTER — Encounter (HOSPITAL_COMMUNITY)
Admission: RE | Admit: 2017-05-15 | Discharge: 2017-05-15 | Disposition: A | Discharge status: Home / Self Care | Payer: Medicare Other | Source: Ambulatory Visit | Attending: Gynecologic Oncology | Admitting: Gynecologic Oncology

## 2017-05-20 ENCOUNTER — Ambulatory Visit (HOSPITAL_COMMUNITY): Admission: RE | Admit: 2017-05-20 | Payer: Medicare Other | Source: Ambulatory Visit | Admitting: Gynecologic Oncology

## 2017-05-20 ENCOUNTER — Encounter (HOSPITAL_COMMUNITY): Admission: RE | Payer: Self-pay | Source: Ambulatory Visit

## 2017-05-20 SURGERY — SALPINGO-OOPHORECTOMY, BILATERAL, ROBOT-ASSISTED
Anesthesia: General

## 2017-05-20 SURGERY — Surgical Case
Anesthesia: *Unknown

## 2017-05-21 ENCOUNTER — Encounter: Payer: Self-pay | Admitting: Gynecologic Oncology

## 2017-05-21 ENCOUNTER — Ambulatory Visit: Payer: Medicare Other | Attending: Gynecologic Oncology | Admitting: Gynecologic Oncology

## 2017-05-21 VITALS — BP 145/76 | HR 73 | Temp 97.9°F | Resp 20 | Ht 65.5 in | Wt 214.0 lb

## 2017-05-21 DIAGNOSIS — Z96652 Presence of left artificial knee joint: Secondary | ICD-10-CM | POA: Insufficient documentation

## 2017-05-21 DIAGNOSIS — C786 Secondary malignant neoplasm of retroperitoneum and peritoneum: Secondary | ICD-10-CM | POA: Diagnosis not present

## 2017-05-21 DIAGNOSIS — R1030 Lower abdominal pain, unspecified: Secondary | ICD-10-CM | POA: Diagnosis not present

## 2017-05-21 DIAGNOSIS — I10 Essential (primary) hypertension: Secondary | ICD-10-CM | POA: Diagnosis not present

## 2017-05-21 DIAGNOSIS — Z79899 Other long term (current) drug therapy: Secondary | ICD-10-CM | POA: Insufficient documentation

## 2017-05-21 DIAGNOSIS — R1032 Left lower quadrant pain: Secondary | ICD-10-CM

## 2017-05-21 DIAGNOSIS — E039 Hypothyroidism, unspecified: Secondary | ICD-10-CM | POA: Diagnosis not present

## 2017-05-21 DIAGNOSIS — C541 Malignant neoplasm of endometrium: Secondary | ICD-10-CM | POA: Diagnosis present

## 2017-05-21 NOTE — Progress Notes (Signed)
GYN ONCOLOGY OFFICE VISIT    Michelle Rosario 71 y.o. female  Chief Complaint  Patient presents with  . 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 Foundation One testing of tumor.   Patient desires systemic therapy (not surgery) - would consider Everolimus with letrozole. We will arrange for her to go back to see Dr Alvy Bimler for this.  She has new gripey lower abdominal pains. I discussed that these may be progression of disease vs colitis. Will evaluate with CT imaging.   HPI:    Oncology History   The patient is a 71 year old G1P1 who was initially seen in consultation at the request of Dr Pamala Hurry for grade 2 endometrial cancer. A transvaginal ultrasound scan was performed on 09/11/2015. It revealed a uterus measuring 7.2 x 6 x 4.5 cm with a thickened endometrial stripe of 29 mm. The ovaries were normal bilaterally. 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  MSI stable     Recurrent carcinoma of endometrium ([place])   01/02/2016 Pathology Results    Uterus +/- tubes/ovaries, neoplastic, cervix ENDOMETRIAL ADENOCARCINOMA, FIGO GRADE 2 (4.7 CM) THE TUMOR INVADES LESS THAN ONE-HALF OF THE MYOMETRIUM (PT1A) ALL MARGINS OF RESECTION ARE NEGATIVE FOR CARCINOMA LEIOMYOMAS AND ADENOMYOSIS BILATERAL FALLOPIAN TUBES AND OVARIES: HISTOLOGICAL UNREMARKABLE 2. Lymph node, sentinel, biopsy, right obturator ONE BENIGN LYMPH NODE (0/1) 3. Lymph nodes, regional resection, left pelvic FOUR BENIGN LYMPH NODES (0/4)      01/02/2016 Surgery    Dr. Denman George performed robotic-assisted laparoscopic total hysterectomy with bilateral salpingoophorectomy, sentinel lymph node biopsy, lymphadenectomy        05/15/2016 Pathology Results    Vagina, biopsy, mid -  ADENOCARCINOMA, SEE COMMENT. Microscopic Comment The morphology along with the patient's history are consistent with recurrent endometrioid adenocarcinoma. The carcinoma has a similar appearance to the primary ([id number]).      05/20/2016 Imaging    Ct scan abdomen showed solid 2.5 cm peritoneal mass in the mid to anterior left pelvis, suspicious for peritoneal metastasis. 2. Small expansile low-attenuation filling defect in the left external iliac vein, cannot exclude a small deep venous thrombus. Consider correlation with left lower extremity venous Doppler scan. 3. Small simple fluid density structure in the left pelvic sidewall abutting the left external iliac vessels, favor a small postoperative seroma. 4. No ascites. 5. No lymphadenopathy.  No metastatic disease in the chest. 6. Aortic atherosclerosis.      06/12/2016 PET scan    Intensely hypermetabolic 2.1 cm central pelvic peritoneal mass just to the left of midline, consistent with peritoneal metastatic recurrence. No ascites. 2. No additional hypermetabolic sites of metastatic disease. 3. Diffuse thyroid hypermetabolism without discrete thyroid nodule, favoring thyroiditis. Recommend correlation with serum thyroid function tests.      06/24/2016 - 07/22/2016 Chemotherapy    The patient had weekly cisplatin. She has missed several doses due to infection and pancytopenia      06/24/2016 - 08/01/2016 Radiation Therapy    She completed concurrent radiation therapy Radiation treatment dates:   IMRT : 06/24/16 - 08/01/16 HDR : 08/13/16, 08/20/16, 08/27/16, 09/04/16  Site/dose:   Pelvis treated to 55 Gy in 25 fractions (simultaneous integrated boost technique) Vaginal Cuff treated to 24 Gy in 4 fractions      08/15/2016 - 12/03/2016 Chemotherapy    She received  6 cycles of carboplatin/Taxol      09/27/2016 Imaging    Interval decrease in size of previously described solid peritoneal nodule within the left anterior pelvis. Near complete  resolution of previously described low-attenuation structure along the left pelvic sidewall. No evidence for metastatic disease in the chest. Aortic atherosclerosis.      12/30/2016 Imaging    Ct abdomen 1. Solitary left pelvic peritoneal implant is mildly decreased in size in the interval. 2. No new or progressive metastatic disease in the abdomen or pelvis. No ascites. 3. Aortic atherosclerosis.      04/02/2017 Imaging    Left lower quadrant peritoneal implant referenced on previous exam measures 1.7 x 1.3 cm, image 69 of series 2. Increased from 0.8 x 0.8 cm previously peer no new peritoneal implants identified.  Musculoskeletal: The degenerative disc disease noted within the lumbar spine.  IMPRESSION: 1. Solitary left pelvic peritoneal implant is increased in size in the interval. 2. No new sites of disease.  No ascites. 3. Aortic atherosclerosis     MSI stable  PET/CT 04/11/17: The hypermetabolic peritoneal implant within the left side of pelvis measures 1.9 x 1.3 cm and has an SUV max equal to 11.36. Previously this measured 2.1 x 2.1 cm and had an SUV max equal to 37.02.  She was offered surgical excision followed by systemic therapy vs systemic therapy alone and was unsure of how to move forward.  Interval Hx:  The patient was seen by Dr Theora Gianotti at [facility] with recommendation for either surgery or systemic therapy with consideration for hormonal therapy. She recommended Foundation One testing and ER/PR testing.  The patient decided that she did not want to proceed with surgery as she was worried about complications (particularly GI/bowel/colostomy). Instead, she is preferring for systemic therapy with hormonal modulation.  She has persistent intermittent abdominal pains across the lower abdomen present every day for 2 weeks, somewhat better with Bratt diet, no exacerbating factors. No constipation or diarrhea. No fever.  Review of Systems:10 point review of systems is  negative except as noted in interval history.   Vitals: Blood pressure (!) 145/76, pulse 73, temperature 97.9 F (36.6 C), temperature source Oral, resp. rate 20, height 5' 5.5" (1.664 m), weight 214 lb (97.1 kg), SpO2 99 %. Body mass index is 35.07 kg/m.   Physical Exam: General : The patient is a healthy woman in no acute distress.  HEENT: normocephalic, extraoccular movements normal; neck is supple without thyromegally         Allergies  Allergen Reactions  . Latex Itching    Past Medical History:  Diagnosis Date  . Broken ribs    hx of  . Family history of adverse reaction to anesthesia    mother post op N&V  . History of radiation therapy 06/24/16-08/01/16 and 08/13/16, 08/20/16, 08/27/16, 09/04/16   pelvis treated to 55 Gy in 25 fractions, vaginal cuff treated to 24 Gy in 4 fractions  . Hypertension   . Hypothyroidism   . Uterine cancer [facility])    May 30 , 2017    Past Surgical History:  Procedure Laterality Date  . achellis tendon repaired in 2005    . ANKLE SURGERY Right Oct 2006  . fisure  2014  . rectal fissure surgery 2010    . REPLACEMENT TOTAL KNEE Left 2015  . TUBAL LIGATION      Current Outpatient Medications  Medication Sig Dispense Refill  . ALPRAZolam (XANAX) 1 MG tablet Take one tablet at bedtime  as needed for insomnia and restless legs.    Marland Kitchen amoxicillin (AMOXIL) 500 MG capsule Take 2,000 mg by mouth See admin instructions. Prior to dental appointment, takes 4 tables prior to appt    . Biotin w/ Vitamins C & E (HAIR/SKIN/NAILS PO) Take 1 tablet by mouth daily.    . Calcium Carb-Cholecalciferol (CALCIUM 600 + D PO) Take 2 tablets by mouth daily.    . chlorhexidine (PERIDEX) 0.12 % solution Use as directed 15 mLs in the mouth or throat 2 (two) times daily. 120 mL 0  . clonazePAM (KLONOPIN) 0.5 MG tablet Take 1 tablet (0.5 mg total) by mouth daily as needed (restless legs). (Patient taking differently: Take 0.5 mg by mouth at bedtime as needed (restless  legs). ) 60 tablet 1  . fluticasone (FLONASE) 50 MCG/ACT nasal spray Place 1 spray into both nostrils 2 (two) times daily as needed for allergies.   1  . gabapentin (NEURONTIN) 600 MG tablet TAKE 1 TABLET BY MOUTH TWICE A DAY (Patient taking differently: Take 1 tablet 2 times a day) 60 tablet 9  . ibuprofen (ADVIL,MOTRIN) 200 MG tablet Take 400 mg by mouth every 8 (eight) hours as needed for mild pain.    Marland Kitchen levothyroxine (SYNTHROID, LEVOTHROID) 88 MCG tablet Take 88 mcg by mouth daily.  3  . loratadine (CLARITIN) 10 MG tablet Take 10 mg by mouth daily.    Marland Kitchen LORazepam (ATIVAN) 1 MG tablet Take 1 mg by mouth at bedtime as needed for sleep.    Marland Kitchen losartan-hydrochlorothiazide (HYZAAR) 100-12.5 MG tablet Take 1/2 tablet daily for hypertension.    . Magnesium 250 MG TABS Take 250 mg by mouth daily.    . Melatonin 10 MG CAPS Take 10 mg by mouth at bedtime.    . ondansetron (ZOFRAN) 8 MG tablet Take 1 tablet (8 mg total) by mouth every 8 (eight) hours as needed for nausea. 60 tablet 1  . potassium gluconate (HM POTASSIUM) 595 (99 K) MG TABS tablet Take 595 mg by mouth daily.    . prochlorperazine (COMPAZINE) 10 MG tablet Take 1 tablet (10 mg total) by mouth every 6 (six) hours as needed for nausea. 20 tablet 0  . traZODone (DESYREL) 50 MG tablet Take 50 mg by mouth at bedtime.      No current facility-administered medications for this visit.     Social History   Socioeconomic History  . Marital status: Divorced    Spouse name: Not on file  . Number of children: 1  . Years of education: Not on file  . Highest education level: Not on file  Social Needs  . Financial resource strain: Not on file  . Food insecurity - worry: Not on file  . Food insecurity - inability: Not on file  . Transportation needs - medical: Not on file  . Transportation needs - non-medical: Not on file  Occupational History  . Occupation: retired Sales executive  Tobacco Use  . Smoking status: Never Smoker  . Smokeless  tobacco: Never Used  Substance and Sexual Activity  . Alcohol use: Yes    Comment: socially  . Drug use: No  . Sexual activity: No  Other Topics Concern  . Not on file  Social History Narrative  . Not on file    Family History  Problem Relation Age of Onset  . Anesthesia problems Mother   . Uterine cancer Mother 20  . Bladder Cancer Father 36  . Diabetes Sister   . Rectal cancer  Cousin 69       d.69     30 minutes of direct face to face counseling time was spent with the patient. This included discussion about prognosis and therapy recommendations.   Donaciano Eva, MD 05/21/2017, 5:23 PM

## 2017-05-21 NOTE — Patient Instructions (Addendum)
Dr Calton Dach office will contact you to notify you about an appointment. Your CT is scheduled for Monday 05-26-17 at [facility] [place] at 2:30pm

## 2017-05-22 ENCOUNTER — Telehealth: Payer: Self-pay | Admitting: *Deleted

## 2017-05-22 ENCOUNTER — Encounter: Payer: Self-pay | Admitting: Gynecologic Oncology

## 2017-05-22 ENCOUNTER — Other Ambulatory Visit: Payer: Self-pay | Admitting: Gynecologic Oncology

## 2017-05-22 DIAGNOSIS — C541 Malignant neoplasm of endometrium: Secondary | ICD-10-CM

## 2017-05-22 NOTE — Progress Notes (Signed)
Spoke with Michelle Rosario in [employer].  Ordered ER/PR and Foundation One for surgical path from 2017

## 2017-05-22 NOTE — Telephone Encounter (Signed)
Called and left the patient a message to call the office back. Patient needs to be given an appt time for Dr.Gorsuch.

## 2017-05-23 ENCOUNTER — Telehealth: Payer: Self-pay | Admitting: *Deleted

## 2017-05-23 NOTE — Telephone Encounter (Signed)
Spoke with the patient and scheduled her appt with Dr. Alvy Bimler. Appt on Tuesday November 20th at 1pm

## 2017-05-26 ENCOUNTER — Ambulatory Visit (HOSPITAL_COMMUNITY)
Admission: RE | Admit: 2017-05-26 | Discharge: 2017-05-26 | Disposition: A | Discharge status: Home / Self Care | Payer: Medicare Other | Source: Ambulatory Visit | Attending: Gynecologic Oncology | Admitting: Gynecologic Oncology

## 2017-05-26 DIAGNOSIS — M5136 Other intervertebral disc degeneration, lumbar region: Secondary | ICD-10-CM | POA: Diagnosis not present

## 2017-05-26 DIAGNOSIS — Z90710 Acquired absence of both cervix and uterus: Secondary | ICD-10-CM | POA: Diagnosis not present

## 2017-05-26 DIAGNOSIS — I517 Cardiomegaly: Secondary | ICD-10-CM | POA: Diagnosis not present

## 2017-05-26 DIAGNOSIS — R1032 Left lower quadrant pain: Secondary | ICD-10-CM | POA: Diagnosis present

## 2017-05-26 DIAGNOSIS — K429 Umbilical hernia without obstruction or gangrene: Secondary | ICD-10-CM | POA: Diagnosis not present

## 2017-05-26 DIAGNOSIS — M47816 Spondylosis without myelopathy or radiculopathy, lumbar region: Secondary | ICD-10-CM | POA: Diagnosis not present

## 2017-05-26 DIAGNOSIS — I700 Atherosclerosis of aorta: Secondary | ICD-10-CM | POA: Diagnosis not present

## 2017-05-26 LAB — POCT I-STAT CREATININE: CREATININE: 0.8 mg/dL (ref 0.44–1.00)

## 2017-05-26 MED ORDER — IOPAMIDOL (ISOVUE-300) INJECTION 61%
INTRAVENOUS | Status: AC
  Filled 2017-05-26: qty 100

## 2017-05-26 MED ORDER — IOPAMIDOL (ISOVUE-300) INJECTION 61%
100.0000 mL | Freq: Once | INTRAVENOUS | Status: AC | PRN
  Administered 2017-05-26: 100 mL via INTRAVENOUS

## 2017-05-27 ENCOUNTER — Encounter: Payer: Self-pay | Admitting: Hematology and Oncology

## 2017-05-27 ENCOUNTER — Ambulatory Visit (HOSPITAL_BASED_OUTPATIENT_CLINIC_OR_DEPARTMENT_OTHER): Payer: Medicare Other | Admitting: Hematology and Oncology

## 2017-05-27 DIAGNOSIS — Z7189 Other specified counseling: Secondary | ICD-10-CM

## 2017-05-27 DIAGNOSIS — C541 Malignant neoplasm of endometrium: Secondary | ICD-10-CM | POA: Diagnosis present

## 2017-05-27 MED ORDER — EVEROLIMUS 10 MG PO TABS: 10.0000 mg | ORAL_TABLET | Freq: Every day | ORAL | 11 refills | Status: DC

## 2017-05-27 MED ORDER — LETROZOLE 2.5 MG PO TABS: 2.5000 mg | ORAL_TABLET | Freq: Every day | ORAL | 1 refills | Status: DC

## 2017-05-27 MED ORDER — HYDROMORPHONE HCL 4 MG PO TABS: 4.0000 mg | ORAL_TABLET | ORAL | 0 refills | Status: DC | PRN

## 2017-05-27 NOTE — Assessment & Plan Note (Signed)
I have reviewed imaging study and discussed the goals of care with the patient and caregiver Unfortunately, she have recurrence of disease I am surprised, even with minimum signs of disease, she is actually quite symptomatic I suspect she might have more disease burden that is not detectable on recent imaging We discussed the current guidelines and various options Additional test on her tissue samples are pending  Ultimately, we agreed to try combination therapy with letrozole and everolimus The rationale behind the treatment is based on publication below: J Clin Oncol. 2015 Mar 10; 33(8): 930-936.  Published online 2015 Jan 26. doi: 10.1200/JCO.2014.58.3401  PMCID: [id number]  PMID: [id number]  Phase II Study of Everolimus and Letrozole in Patients With Recurrent Endometrial Carcinoma Virl Cagey. Slomovitz, Renford Dills, [place] S. Arelia Sneddon T. Marene Lenz, [address] [place], [address] [place], Luther Parody, Shelah Lewandowsky, Avie Arenas, and Jaymes Graff. [place]   Purpose The phosphoinositol-3 kinase (PI3K) pathway is frequently dysregulated in endometrial cancer (EC). Hormonal manipulation leads to response in some patients with EC, but resistance derived from PI3K pathway activation has been documented. Targeting mammalian target of rapamycin (mTOR) may overcome endocrine resistance. We conducted a two-institution phase II trial of everolimus and letrozole in women with recurrent EC. Patients and Methods Patients were considered incurable, had measurable disease, and were treated with up to two prior cytotoxic regimens. Everolimus was administered orally at 10 mg daily and letrozole was administered orally at 2.5 mg daily. Each cycle consisted of 4 weeks of therapy. Patients were treated until progression, toxicity, or complete response (CR). The primary end point was the clinical benefit rate (CBR), which was defined as CR, partial response, or stable  disease (? 16 weeks) by RECIST 1.0 criteria. Translational studies were performed to correlate biomarkers with response. Results Thirty-eight patients were enrolled (median age, 61 years; range, 24 to 82 years). Thirty-five patients were evaluable for response. The CBR was 40% (14 of 35 patients); the median number of cycles among responders was 15 (range, seven to 29 cycles). The confirmed objective response rate (RR) was 32% (11 of 35 patients; nine CRs and two partial responses; median, 15 cycles; range, eight to 29 cycles). Twenty percent of patients (seven of 35 patients) were taken off treatment after a prolonged CR and at the discretion of the treating clinician. None of the patients discontinued treatment as a result of toxicity. Serous histology was the best predictor of lack of response. Patients with endometrioid histology and CTNNB1 mutations responded well to everolimus and letrozole. Conclusion Everolimus plus letrozole results in a high CBR and RR in patients with recurrent EC. Further development of this combination in recurrent endometrioid EC is under way.  I recommend we start her on letrozole right away after Thanksgiving I would get insurance prior authorization and payment assistant with the pharmacist for everolimus As soon as I know when her prescription will be delivered, I will schedule return visit and blood work monitoring and toxicity review over the next few weeks. She agreed with the plan of care

## 2017-05-28 DIAGNOSIS — Z7189 Other specified counseling: Secondary | ICD-10-CM | POA: Insufficient documentation

## 2017-05-28 NOTE — Assessment & Plan Note (Signed)
The patient is aware she has incurable disease and treatment is strictly palliative. We discussed importance of Advanced Directives and Living will. 

## 2017-05-28 NOTE — Progress Notes (Signed)
[place] OFFICE PROGRESS NOTE  Patient Care Team: [name], MD as PCP - General (Family Medicine)  SUMMARY OF ONCOLOGIC HISTORY: Oncology History   The patient is a 71 year old G1P1 who was initially seen in consultation at the request of Dr Pamala Hurry for grade 2 endometrial cancer. A transvaginal ultrasound scan was performed on 09/11/2015. It revealed a uterus measuring 7.2 x 6 x 4.5 cm with a thickened endometrial stripe of 29 mm. The ovaries were normal bilaterally. 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  MSI stable     Recurrent carcinoma of endometrium ([place])   01/02/2016 Pathology Results    Uterus +/- tubes/ovaries, neoplastic, cervix ENDOMETRIAL ADENOCARCINOMA, FIGO GRADE 2 (4.7 CM) THE TUMOR INVADES LESS THAN ONE-HALF OF THE MYOMETRIUM (PT1A) ALL MARGINS OF RESECTION ARE NEGATIVE FOR CARCINOMA LEIOMYOMAS AND ADENOMYOSIS BILATERAL FALLOPIAN TUBES AND OVARIES: HISTOLOGICAL UNREMARKABLE 2. Lymph node, sentinel, biopsy, right obturator ONE BENIGN LYMPH NODE (0/1) 3. Lymph nodes, regional resection, left pelvic FOUR BENIGN LYMPH NODES (0/4)      01/02/2016 Surgery    Dr. Denman George performed robotic-assisted laparoscopic total hysterectomy with bilateral salpingoophorectomy, sentinel lymph node biopsy, lymphadenectomy        05/15/2016 Pathology Results    Vagina, biopsy, mid - ADENOCARCINOMA, SEE COMMENT. Microscopic Comment The morphology along with the patient's history are consistent with recurrent endometrioid adenocarcinoma. The carcinoma has a similar appearance to the primary ([id number]).      05/20/2016 Imaging    Ct scan abdomen showed solid 2.5 cm peritoneal mass in the mid to anterior left pelvis, suspicious for peritoneal metastasis. 2. Small expansile low-attenuation filling defect in the left external iliac vein, cannot exclude a small deep venous thrombus.  Consider correlation with left lower extremity venous Doppler scan. 3. Small simple fluid density structure in the left pelvic sidewall abutting the left external iliac vessels, favor a small postoperative seroma. 4. No ascites. 5. No lymphadenopathy.  No metastatic disease in the chest. 6. Aortic atherosclerosis.      06/12/2016 PET scan    Intensely hypermetabolic 2.1 cm central pelvic peritoneal mass just to the left of midline, consistent with peritoneal metastatic recurrence. No ascites. 2. No additional hypermetabolic sites of metastatic disease. 3. Diffuse thyroid hypermetabolism without discrete thyroid nodule, favoring thyroiditis. Recommend correlation with serum thyroid function tests.      06/24/2016 - 07/22/2016 Chemotherapy    The patient had weekly cisplatin. She has missed several doses due to infection and pancytopenia      06/24/2016 - 08/01/2016 Radiation Therapy    She completed concurrent radiation therapy Radiation treatment dates:   IMRT : 06/24/16 - 08/01/16 HDR : 08/13/16, 08/20/16, 08/27/16, 09/04/16  Site/dose:   Pelvis treated to 55 Gy in 25 fractions (simultaneous integrated boost technique) Vaginal Cuff treated to 24 Gy in 4 fractions      08/15/2016 - 12/03/2016 Chemotherapy    She received 6 cycles of carboplatin/Taxol      09/27/2016 Imaging    Interval decrease in size of previously described solid peritoneal nodule within the left anterior pelvis. Near complete resolution of previously described low-attenuation structure along the left pelvic sidewall. No evidence for metastatic disease in the chest. Aortic atherosclerosis.      12/30/2016 Imaging    Ct abdomen 1. Solitary left pelvic peritoneal implant is mildly decreased in size in the interval. 2. No new or progressive metastatic disease in the abdomen or  pelvis. No ascites. 3. Aortic atherosclerosis.      04/02/2017 Imaging    Left lower quadrant peritoneal implant referenced on previous exam measures  1.7 x 1.3 cm, image 69 of series 2. Increased from 0.8 x 0.8 cm previously peer no new peritoneal implants identified.  Musculoskeletal: The degenerative disc disease noted within the lumbar spine.  IMPRESSION: 1. Solitary left pelvic peritoneal implant is increased in size in the interval. 2. No new sites of disease.  No ascites. 3. Aortic atherosclerosis      04/11/2017 PET scan    1. The left side of pelvis peritoneal implant has decreased in size and degree of FDG uptake compatible with response to therapy. No new areas of peritoneal disease identified. 2. Persistent diffuse increased uptake within the thyroid gland. Correlation with patient's thyroid function may be helpful.      05/26/2017 Imaging    1. Enlarging tumor implant along the left adnexa, currently 2.6 by 2.4 cm and previously 1.9 by 1.3 cm. No new tumor implant or other specific cause for the patient's pelvic symptoms is currently identified. 2.  Aortic Atherosclerosis (ICD10-I70.0). 3. Lumbar spondylosis and degenerative disc disease causing multilevel impingement.       INTERVAL HISTORY: Please see below for problem oriented charting. She returns for further follow-up after recent imaging studies review disease recurrence. She has intermittent left lower quadrant discomfort.  She denies significant changes in bowel habits.  No symptoms of bloating.  No vaginal bleeding.  Her appetite is stable.  REVIEW OF SYSTEMS:   Constitutional: Denies fevers, chills or abnormal weight loss Eyes: Denies blurriness of vision Ears, nose, mouth, throat, and face: Denies mucositis or sore throat Respiratory: Denies cough, dyspnea or wheezes Cardiovascular: Denies palpitation, chest discomfort or lower extremity swelling Gastrointestinal:  Denies nausea, heartburn or change in bowel habits Skin: Denies abnormal skin rashes Lymphatics: Denies new lymphadenopathy or easy bruising Neurological:Denies numbness, tingling or new  weaknesses Behavioral/Psych: Mood is stable, no new changes  All other systems were reviewed with the patient and are negative.  I have reviewed the past medical history, past surgical history, social history and family history with the patient and they are unchanged from previous note.  ALLERGIES:  is allergic to latex.  MEDICATIONS:  Current Outpatient Medications  Medication Sig Dispense Refill  . ALPRAZolam (XANAX) 1 MG tablet Take one tablet at bedtime as needed for insomnia and restless legs.    Marland Kitchen amoxicillin (AMOXIL) 500 MG capsule Take 2,000 mg by mouth See admin instructions. Prior to dental appointment, takes 4 tables prior to appt    . Biotin w/ Vitamins C & E (HAIR/SKIN/NAILS PO) Take 1 tablet by mouth daily.    . Calcium Carb-Cholecalciferol (CALCIUM 600 + D PO) Take 2 tablets by mouth daily.    . chlorhexidine (PERIDEX) 0.12 % solution Use as directed 15 mLs in the mouth or throat 2 (two) times daily. 120 mL 0  . clonazePAM (KLONOPIN) 0.5 MG tablet Take 1 tablet (0.5 mg total) by mouth daily as needed (restless legs). (Patient taking differently: Take 0.5 mg by mouth at bedtime as needed (restless legs). ) 60 tablet 1  . everolimus (AFINITOR) 10 MG tablet Take 1 tablet (10 mg total) by mouth daily. 30 tablet 11  . fluticasone (FLONASE) 50 MCG/ACT nasal spray Place 1 spray into both nostrils 2 (two) times daily as needed for allergies.   1  . gabapentin (NEURONTIN) 600 MG tablet TAKE 1 TABLET BY MOUTH TWICE A  DAY (Patient taking differently: Take 1 tablet 2 times a day) 60 tablet 9  . HYDROmorphone (DILAUDID) 4 MG tablet Take 1 tablet (4 mg total) by mouth every 4 (four) hours as needed for severe pain. 60 tablet 0  . ibuprofen (ADVIL,MOTRIN) 200 MG tablet Take 400 mg by mouth every 8 (eight) hours as needed for mild pain.    Marland Kitchen letrozole (FEMARA) 2.5 MG tablet Take 1 tablet (2.5 mg total) by mouth daily. 90 tablet 1  . levothyroxine (SYNTHROID, LEVOTHROID) 88 MCG tablet Take 88  mcg by mouth daily.  3  . loratadine (CLARITIN) 10 MG tablet Take 10 mg by mouth daily.    Marland Kitchen LORazepam (ATIVAN) 1 MG tablet Take 1 mg by mouth at bedtime as needed for sleep.    Marland Kitchen losartan-hydrochlorothiazide (HYZAAR) 100-12.5 MG tablet Take 1/2 tablet daily for hypertension.    . Magnesium 250 MG TABS Take 250 mg by mouth daily.    . Melatonin 10 MG CAPS Take 10 mg by mouth at bedtime.    . ondansetron (ZOFRAN) 8 MG tablet Take 1 tablet (8 mg total) by mouth every 8 (eight) hours as needed for nausea. 60 tablet 1  . potassium gluconate (HM POTASSIUM) 595 (99 K) MG TABS tablet Take 595 mg by mouth daily.    . prochlorperazine (COMPAZINE) 10 MG tablet Take 1 tablet (10 mg total) by mouth every 6 (six) hours as needed for nausea. 20 tablet 0  . traZODone (DESYREL) 50 MG tablet Take 50 mg by mouth at bedtime.      No current facility-administered medications for this visit.     PHYSICAL EXAMINATION: ECOG PERFORMANCE STATUS: 1 - Symptomatic but completely ambulatory  Vitals:   05/27/17 1248  BP: (!) 148/84  Pulse: 73  Resp: 18  Temp: 97.9 F (36.6 C)  SpO2: 98%   Filed Weights   05/27/17 1248  Weight: 216 lb 1.6 oz (98 kg)    GENERAL:alert, no distress and comfortable SKIN: skin color, texture, turgor are normal, no rashes or significant lesions EYES: normal, Conjunctiva are pink and non-injected, sclera clear OROPHARYNX:no exudate, no erythema and lips, buccal mucosa, and tongue normal  NECK: supple, thyroid normal size, non-tender, without nodularity LYMPH:  no palpable lymphadenopathy in the cervical, axillary or inguinal LUNGS: clear to auscultation and percussion with normal breathing effort HEART: regular rate & rhythm and no murmurs and no lower extremity edema ABDOMEN:abdomen soft, mild tenderness left lower quadrant Musculoskeletal:no cyanosis of digits and no clubbing  NEURO: alert & oriented x 3 with fluent speech, no focal motor/sensory deficits  LABORATORY DATA:   I have reviewed the data as listed    Component Value Date/Time   NA 140 04/02/2017 1017   K 4.7 04/02/2017 1017   CL 106 08/30/2016 0527   CO2 29 04/02/2017 1017   GLUCOSE 87 04/02/2017 1017   BUN 30.1 (H) 04/02/2017 1017   CREATININE 0.80 05/26/2017 1542   CREATININE 0.8 04/02/2017 1017   CALCIUM 9.9 04/02/2017 1017   PROT 7.3 04/02/2017 1017   ALBUMIN 3.6 04/02/2017 1017   AST 23 04/02/2017 1017   ALT 18 04/02/2017 1017   ALKPHOS 114 04/02/2017 1017   BILITOT 0.34 04/02/2017 1017   GFRNONAA >60 08/31/2016 0539   GFRAA >60 08/31/2016 0539    No results found for: SPEP, UPEP  Lab Results  Component Value Date   WBC 7.5 04/02/2017   NEUTROABS 5.5 04/02/2017   HGB 11.9 04/02/2017   HCT 36.2  04/02/2017   MCV 86.0 04/02/2017   PLT 166 04/02/2017      Chemistry      Component Value Date/Time   NA 140 04/02/2017 1017   K 4.7 04/02/2017 1017   CL 106 08/30/2016 0527   CO2 29 04/02/2017 1017   BUN 30.1 (H) 04/02/2017 1017   CREATININE 0.80 05/26/2017 1542   CREATININE 0.8 04/02/2017 1017      Component Value Date/Time   CALCIUM 9.9 04/02/2017 1017   ALKPHOS 114 04/02/2017 1017   AST 23 04/02/2017 1017   ALT 18 04/02/2017 1017   BILITOT 0.34 04/02/2017 1017       RADIOGRAPHIC STUDIES: I have personally reviewed the radiological images as listed and agreed with the findings in the report. Ct Abdomen Pelvis W Contrast  Result Date: 05/26/2017 CLINICAL DATA:  Lower pelvic pain for 2 weeks. History of endometrial cancer diagnosed in November 2017, radiation therapy finished in June 2018, known tumor implant in the left hemipelvis. EXAM: CT ABDOMEN AND PELVIS WITH CONTRAST TECHNIQUE: Multidetector CT imaging of the abdomen and pelvis was performed using the standard protocol following bolus administration of intravenous contrast. CONTRAST:  118m ISOVUE-300 IOPAMIDOL (ISOVUE-300) INJECTION 61% COMPARISON:  Multiple exams, including 04/11/2017 PET-CT FINDINGS: Lower  chest: Mild cardiomegaly. Hepatobiliary: Stable 7 mm hypodense lesion in the right hepatic lobe on image 15/2, not previously hypermetabolic. Likely a cyst or similar benign lesion. Otherwise unremarkable. Pancreas: Unremarkable Spleen: Unremarkable Adrenals/Urinary Tract: Unremarkable Stomach/Bowel: Unremarkable Vascular/Lymphatic: Aortoiliac atherosclerotic vascular disease. No pathologic adenopathy. Reproductive: Hysterectomy. The tumor implant along the left adnexa region continues to enlarge, currently 2.4 by 2.6 cm and previously 1.9 by 1.3 cm. Other: No supplemental non-categorized findings. Musculoskeletal: Lumbar spondylosis and degenerative disc disease causing foraminal impingement bilaterally at L4-5 and L5-S1, and with displacement of the lateral extraforaminal portions of the left L2 and L3 nerves at the L2-3 and L3-4 levels. Small umbilical hernia contains adipose tissue. IMPRESSION: 1. Enlarging tumor implant along the left adnexa, currently 2.6 by 2.4 cm and previously 1.9 by 1.3 cm. No new tumor implant or other specific cause for the patient's pelvic symptoms is currently identified. 2.  Aortic Atherosclerosis (ICD10-I70.0). 3. Lumbar spondylosis and degenerative disc disease causing multilevel impingement. Electronically Signed   By: WVan ClinesM.D.   On: 05/26/2017 16:21    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[place]ec[place] I have reviewed imaging study and discussed the goals of care with the patient and caregiver Unfortunately, she have recurrence of disease I am surprised, even with minimum signs of disease, she is actually quite symptomatic I suspect she might have more disease burden that is not detectable on recent imaging We discussed the current guidelines and various options Additional test on her tissue samples are pending  Ultimately, we agreed to try combination therapy with letrozole and everolimus The rationale behind the treatment is based on  publication below: J Clin Oncol. 2015 Mar 10; 33(8): 930-936.  Published online 2015 Jan 26. doi: 10.1200/JCO.2014.58.3401  PMCID: [id number] PMID: [id number] Phase II Study of Everolimus and Letrozole in Patients With Recurrent Endometrial Carcinoma BVirl Cagey Slomovitz, YRenford Dills MCumberlandS. YArelia SneddonT. SMarene Lenz M[address] South Garden StreetNOakvale [address] DriveLWestwood Lakes QLuther Parody DShelah Lewandowsky KAvie Arenas and RJaymes Graff Coleman   Purpose The phosphoinositol-3 kinase (PI3K) pathway is frequently dysregulated in endometrial cancer (EC). Hormonal manipulation leads to response in some patients with EC, but resistance derived from PI3K  pathway activation has been documented. Targeting mammalian target of rapamycin (mTOR) may overcome endocrine resistance. We conducted a two-institution phase II trial of everolimus and letrozole in women with recurrent EC. Patients and Methods Patients were considered incurable, had measurable disease, and were treated with up to two prior cytotoxic regimens. Everolimus was administered orally at 10 mg daily and letrozole was administered orally at 2.5 mg daily. Each cycle consisted of 4 weeks of therapy. Patients were treated until progression, toxicity, or complete response (CR). The primary end point was the clinical benefit rate (CBR), which was defined as CR, partial response, or stable disease (? 16 weeks) by RECIST 1.0 criteria. Translational studies were performed to correlate biomarkers with response. Results Thirty-eight patients were enrolled (median age, 42 years; range, 24 to 82 years). Thirty-five patients were evaluable for response. The CBR was 40% (14 of 35 patients); the median number of cycles among responders was 15 (range, seven to 29 cycles). The confirmed objective response rate (RR) was 32% (11 of 35 patients; nine CRs and two partial responses; median, 15 cycles; range, eight to 29 cycles). Twenty percent of  patients (seven of 35 patients) were taken off treatment after a prolonged CR and at the discretion of the treating clinician. None of the patients discontinued treatment as a result of toxicity. Serous histology was the best predictor of lack of response. Patients with endometrioid histology and CTNNB1 mutations responded well to everolimus and letrozole. Conclusion Everolimus plus letrozole results in a high CBR and RR in patients with recurrent EC. Further development of this combination in recurrent endometrioid EC is under way.  I recommend we start her on letrozole right away after Thanksgiving I would get insurance prior authorization and payment assistant with the pharmacist for everolimus As soon as I know when her prescription will be delivered, I will schedule return visit and blood work monitoring and toxicity review over the next few weeks. She agreed with the plan of care  Goals of care, counseling/discussion The patient is aware she has incurable disease and treatment is strictly palliative. We discussed importance of Advanced Directives and Living will.   No orders of the defined types were placed in this encounter.  All questions were answered. The patient knows to call the clinic with any problems, questions or concerns. No barriers to learning was detected. I spent 40 minutes counseling the patient face to face. The total time spent in the appointment was 60 minutes and more than 50% was on counseling and review of test results     Heath Lark, MD 05/28/2017 7:24 AM

## 2017-05-30 ENCOUNTER — Telehealth: Payer: Self-pay | Admitting: Pharmacy Technician

## 2017-05-30 ENCOUNTER — Telehealth: Payer: Self-pay | Admitting: Pharmacist

## 2017-05-30 DIAGNOSIS — C541 Malignant neoplasm of endometrium: Secondary | ICD-10-CM

## 2017-05-30 NOTE — Telephone Encounter (Signed)
Oral Oncology Patient Advocate Encounter  Received notification from [place] that prior authorization for Afinitor is required.  PA submitted on CoverMyMeds Key [id number] Status is pending  Oral Oncology Clinic will continue to follow.  Michelle Rosario. Melynda Keller, [place] Patient [place] [phone] 05/30/2017 10:30 AM

## 2017-05-30 NOTE — Telephone Encounter (Signed)
Oral Oncology Pharmacist Encounter  Received new prescription for Afinitor (everolimus) for the treatment of recurrent endometrial cancer in conjunction with letrozole, planned duration until disease progression or unacceptable toxicity. Letrozole prescription sent to  Patient's local [facility] on 05/[facility]/17, noted to start after Thanksgiving.  Labs from 04/02/17 assessed, OK for treatment. BPs in Epic reviewed, most values in hypertensive range, this will continue to be monitored. Urine protein and lipid panel have been ordered for next office visit.  Current medication list in Epic reviewed, no DDIs with Afinitor identified. Prescription for dexamethasone mouthwash will be sent to patient's pharmacy once start date for Afinitor is determined.  Prescription has been e-scribed to the [facility] for benefits analysis and approval. Test claim revealed $4000 copayment for 1st month's supply. There are no copayment grants available for patient's diagnosis. This will be discussed with MD and patient.  Oral Oncology Patient Advocate to reach out to [facility] program to see if copay can be reduced by sending to alternate pharmacy.  Oral Oncology Clinic will continue to follow for copayment issues, initial counseling and start date.  Johny Drilling, PharmD, BCPS, BCOP 05/30/2017 12:32 PM Oral Oncology Clinic [phone]

## 2017-05-30 NOTE — Telephone Encounter (Signed)
Oral Oncology Patient Advocate Encounter  Prior Authorization for Afinitor has been approved.    Effective dates: 04/30/2017 through 05/30/2018  Oral Oncology Clinic will continue to follow.   Fabio Asa. Melynda Keller, [place] Patient [place] [phone] 05/30/2017 11:03 AM

## 2017-06-02 ENCOUNTER — Telehealth: Payer: Self-pay

## 2017-06-02 ENCOUNTER — Encounter: Payer: Self-pay | Admitting: Gynecologic Oncology

## 2017-06-02 NOTE — Progress Notes (Signed)
Gynecologic Oncology Multi-Disciplinary Disposition Conference Note  Date of the Conference: June 02, 2017  Patient Name: Michelle Rosario  Referring Provider: Dr. Pamala Hurry Primary GYN Oncologist: Dr. Everitt Amber  Stage/Disposition:  Recurrent endometrial cancer.  Disposition is to combination therapy with letrozole and everolimus.  Repeat imaging after three months of therapy.     This Multidisciplinary conference took place involving physicians from [place], [place], Radiation Oncology, Pathology, Radiology along with the Gynecologic Oncology Nurse Practitioner and RN.  Comprehensive assessment of the patient's malignancy, staging, need for surgery, chemotherapy, radiation therapy, and need for further testing were reviewed. Supportive measures, both inpatient and following discharge were also discussed. The recommended plan of care is documented. Greater than 35 minutes were spent correlating and coordinating this patient's care.

## 2017-06-02 NOTE — Telephone Encounter (Signed)
Her bowels have changed and she has not even started the 2 drugs yet.   She has less and less stool. She goes every 2 days with miralax and stool softener. It used to be daily, large and easy.  It is soft, it is skinny, it is darker, and less volume. She is not hungry so not eating as much.   She did not quite understand the CT results. Is the tumor what is affecting the stool and the stomach pain?  What can she take to make stomach stop hurting. She was using pepto-bismol.  She has not tried her dilaudid - suggested she try that.

## 2017-06-02 NOTE — Telephone Encounter (Signed)
I spent a very long time with her and her friend last visit explaining her CT results 1) yes, bowel habit changes and reduced appetite are due to cancer. 2) she needs to regulate her bowel habits with laxatives 3) She needs to try Dilaudid for pain 4) she needs to start Letrozole when she is ready 5) awaiting assistance for payment for everolimus

## 2017-06-02 NOTE — Telephone Encounter (Signed)
Called with below message. Verbalized understanding. She said she started to Letrozole yesterday. Will start the Dilaudid maybe tonight.

## 2017-06-03 ENCOUNTER — Encounter: Payer: Self-pay | Admitting: Gynecologic Oncology

## 2017-06-03 NOTE — Progress Notes (Signed)
Reordered ER/PR and Foundation One.  Spoke with [place] in [employer].  Pathologist to decide which specimen is adequate to send.

## 2017-06-04 MED ORDER — DEXAMETHASONE 0.5 MG/5ML PO SOLN: ORAL | 2 refills | Status: DC

## 2017-06-04 NOTE — Telephone Encounter (Signed)
Oral Chemotherapy Pharmacist Encounter   I spoke with patient in [facility] lobby today for overview of: Afinitor.   Pt is doing well. Counseled patient on administration, dosing, side effects, monitoring, drug-food interactions, safe handling, storage, and disposal.  Patient will take Afinitor 10mg  tablets, 1 tablet by mouth once daily, with water, without regard to food. Patient understands to take Afinitor consistently with regards to food and at approximately the same time each day. Patient plans to take her Afinitor and Femara with breakfast daily. Patient knows to aviod grapefruit or grapefruit juice while on therapy with Afinitor.  Afinitor start date: 06/05/17 Femara start date: 06/02/17  Side effects include but not limited to: mouth sores, GI upset, rash, increased blood sugars, decreased blood counts, and edema.  Dexamethasone mouthwash for the prevention of stomatiits e-scribed to pharmacy.   Reviewed with patient importance of keeping a medication schedule and plan for any missed doses.  Ms. Riggio voiced understanding and appreciation.   All questions answered. Medication reconciliation performed and medication/allergy list updated.  Patient signed manufacturer assistance application for Afinitor due to prohibitively expensive copayment and was provided samples to start therapy.  Dispensed samples to patient:  Medication: Afinitor 5mg  tablets Instructions: Take 2 tablets (10mg  total) by mouth once daily with a glass of water, without regards to food. Quantity dispensed: 28 Days supply: 14 Manufacturer: Novart[employer] Lot: [id number] Exp: 07/2017  Patient understands she will be transitioned to 10mg  tablets, to take 1 tablet once daily when that supply becomes available.  Patient will need MD follow-up and lab check now that Afinitor start date is known, will be discussed with MD.  Patient knows to call the office with questions or concerns. Oral Oncology Clinic will continue to  follow.  Thank you,  Johny Drilling, PharmD, BCPS, BCOP 06/04/2017 4:35 PM Oral Oncology Clinic [phone]

## 2017-06-05 NOTE — Telephone Encounter (Signed)
Thanks for the update I am planning to see her back next week for toxicity check

## 2017-06-06 ENCOUNTER — Telehealth: Payer: Self-pay | Admitting: Pharmacy Technician

## 2017-06-06 NOTE — Telephone Encounter (Signed)
Oral Oncology Patient Advocate Encounter  Met patient in [place] to complete an application for [employer] [place] ([employer]PA[employer]) in an effort to reduce the patient's out of pocket expense for Afinitor to $0.    Application completed and faxed to [phone].   [employer]PA[employer] phone number for follow up is [phone].   This encounter will be updated until final determination.   Michelle Rosario. Melynda Keller, [place] [facility] [place] [phone] 06/06/2017 2:50 PM

## 2017-06-09 ENCOUNTER — Telehealth: Payer: Self-pay | Admitting: Hematology and Oncology

## 2017-06-09 NOTE — Telephone Encounter (Signed)
Scheduled appt per 11/29 sch message - left message with appt date and time.

## 2017-06-11 ENCOUNTER — Ambulatory Visit: Payer: Federal, State, Local not specified - PPO | Admitting: Gynecologic Oncology

## 2017-06-12 ENCOUNTER — Encounter: Payer: Self-pay | Admitting: Hematology and Oncology

## 2017-06-12 ENCOUNTER — Telehealth: Payer: Self-pay | Admitting: Hematology and Oncology

## 2017-06-12 ENCOUNTER — Other Ambulatory Visit (HOSPITAL_BASED_OUTPATIENT_CLINIC_OR_DEPARTMENT_OTHER): Payer: Medicare Other

## 2017-06-12 ENCOUNTER — Ambulatory Visit (HOSPITAL_BASED_OUTPATIENT_CLINIC_OR_DEPARTMENT_OTHER): Payer: Medicare Other | Admitting: Hematology and Oncology

## 2017-06-12 DIAGNOSIS — G4700 Insomnia, unspecified: Secondary | ICD-10-CM | POA: Insufficient documentation

## 2017-06-12 DIAGNOSIS — G4701 Insomnia due to medical condition: Secondary | ICD-10-CM | POA: Diagnosis not present

## 2017-06-12 DIAGNOSIS — C541 Malignant neoplasm of endometrium: Secondary | ICD-10-CM

## 2017-06-12 DIAGNOSIS — R232 Flushing: Secondary | ICD-10-CM

## 2017-06-12 DIAGNOSIS — I10 Essential (primary) hypertension: Secondary | ICD-10-CM

## 2017-06-12 DIAGNOSIS — G893 Neoplasm related pain (acute) (chronic): Secondary | ICD-10-CM | POA: Insufficient documentation

## 2017-06-12 LAB — CBC WITH DIFFERENTIAL/PLATELET
BASO%: 0.6 % (ref 0.0–2.0)
Basophils Absolute: 0 10*3/uL (ref 0.0–0.1)
EOS%: 3.6 % (ref 0.0–7.0)
Eosinophils Absolute: 0.2 10*3/uL (ref 0.0–0.5)
HEMATOCRIT: 35.4 % (ref 34.8–46.6)
HEMOGLOBIN: 12.1 g/dL (ref 11.6–15.9)
LYMPH#: 1.1 10*3/uL (ref 0.9–3.3)
LYMPH%: 16.2 % (ref 14.0–49.7)
MCH: 27.7 pg (ref 25.1–34.0)
MCHC: 34 g/dL (ref 31.5–36.0)
MCV: 81.4 fL (ref 79.5–101.0)
MONO#: 0.4 10*3/uL (ref 0.1–0.9)
MONO%: 5.1 % (ref 0.0–14.0)
NEUT#: 5.1 10*3/uL (ref 1.5–6.5)
NEUT%: 74.5 % (ref 38.4–76.8)
Platelets: 184 10*3/uL (ref 145–400)
RBC: 4.35 10*6/uL (ref 3.70–5.45)
RDW: 13.3 % (ref 11.2–14.5)
WBC: 6.8 10*3/uL (ref 3.9–10.3)

## 2017-06-12 LAB — COMPREHENSIVE METABOLIC PANEL
ALBUMIN: 3.6 g/dL (ref 3.5–5.0)
ALK PHOS: 143 U/L (ref 40–150)
ALT: 15 U/L (ref 0–55)
AST: 19 U/L (ref 5–34)
Anion Gap: 11 mEq/L (ref 3–11)
BILIRUBIN TOTAL: 0.46 mg/dL (ref 0.20–1.20)
BUN: 19.1 mg/dL (ref 7.0–26.0)
CALCIUM: 9.6 mg/dL (ref 8.4–10.4)
CO2: 25 mEq/L (ref 22–29)
Chloride: 102 mEq/L (ref 98–109)
Creatinine: 0.8 mg/dL (ref 0.6–1.1)
EGFR: >60 mL/min/{1.73_m2} (ref >60)
Glucose: 89 mg/dl (ref 70–140)
POTASSIUM: 3.6 meq/L (ref 3.5–5.1)
SODIUM: 138 meq/L (ref 136–145)
Total Protein: 7.5 g/dL (ref 6.4–8.3)

## 2017-06-12 LAB — UA PROTEIN, DIPSTICK - CHCC: PROTEIN: NEGATIVE mg/dL

## 2017-06-12 NOTE — Assessment & Plan Note (Signed)
She has significant insomnia I recommend she takes Klonopin daily at night She will continue trazodone at 50 mg In the future, I can consider increasing the dose of her medications

## 2017-06-12 NOTE — Telephone Encounter (Signed)
Gave avs and calendar for December and January 2019 °

## 2017-06-12 NOTE — Assessment & Plan Note (Signed)
She has intermittent abdominal pain secondary to cancer She will continue Dilaudid as needed I warned her about risk of constipation and advised her to take laxatives regularly.

## 2017-06-12 NOTE — Assessment & Plan Note (Signed)
Her blood pressure is elevated. Could be due to whitecoat hypertension. I recommend close monitoring only.

## 2017-06-12 NOTE — Assessment & Plan Note (Signed)
So far, she tolerated treatment well except for persistent abdominal pain, changes in bowel habits and hot flashes I recommend weekly blood draw for the next few weeks and then I see her back a month from now Plan to repeat imaging study in 3 months, next due around February 2019

## 2017-06-12 NOTE — Progress Notes (Signed)
[facility] OFFICE PROGRESS NOTE  Patient Care Team: [name], MD as PCP - General (Family Medicine)  SUMMARY OF ONCOLOGIC HISTORY: Oncology History   The patient is a 71 year old G1P1 who was initially seen in consultation at the request of Dr Pamala Hurry for grade 2 endometrial cancer. A transvaginal ultrasound scan was performed on 09/11/2015. It revealed a uterus measuring 7.2 x 6 x 4.5 cm with a thickened endometrial stripe of 29 mm. The ovaries were normal bilaterally. 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  MSI stable  05/15/16: ER is moderately positive (70%). PR is strongly positive (80%).     Recurrent carcinoma of endometrium ([place])   01/02/2016 Pathology Results    Uterus +/- tubes/ovaries, neoplastic, cervix ENDOMETRIAL ADENOCARCINOMA, FIGO GRADE 2 (4.7 CM) THE TUMOR INVADES LESS THAN ONE-HALF OF THE MYOMETRIUM (PT1A) ALL MARGINS OF RESECTION ARE NEGATIVE FOR CARCINOMA LEIOMYOMAS AND ADENOMYOSIS BILATERAL FALLOPIAN TUBES AND OVARIES: HISTOLOGICAL UNREMARKABLE 2. Lymph node, sentinel, biopsy, right obturator ONE BENIGN LYMPH NODE (0/1) 3. Lymph nodes, regional resection, left pelvic FOUR BENIGN LYMPH NODES (0/4)      01/02/2016 Surgery    Dr. Denman George performed robotic-assisted laparoscopic total hysterectomy with bilateral salpingoophorectomy, sentinel lymph node biopsy, lymphadenectomy        05/15/2016 Pathology Results    Vagina, biopsy, mid - ADENOCARCINOMA, SEE COMMENT. Microscopic Comment The morphology along with the patient's history are consistent with recurrent endometrioid adenocarcinoma. The carcinoma has a similar appearance to the primary ([id number]).      05/20/2016 Imaging    Ct scan abdomen showed solid 2.5 cm peritoneal mass in the mid to anterior left pelvis, suspicious for peritoneal metastasis. 2. Small expansile low-attenuation filling defect in  the left external iliac vein, cannot exclude a small deep venous thrombus. Consider correlation with left lower extremity venous Doppler scan. 3. Small simple fluid density structure in the left pelvic sidewall abutting the left external iliac vessels, favor a small postoperative seroma. 4. No ascites. 5. No lymphadenopathy.  No metastatic disease in the chest. 6. Aortic atherosclerosis.      06/12/2016 PET scan    Intensely hypermetabolic 2.1 cm central pelvic peritoneal mass just to the left of midline, consistent with peritoneal metastatic recurrence. No ascites. 2. No additional hypermetabolic sites of metastatic disease. 3. Diffuse thyroid hypermetabolism without discrete thyroid nodule, favoring thyroiditis. Recommend correlation with serum thyroid function tests.      06/24/2016 - 07/22/2016 Chemotherapy    The patient had weekly cisplatin. She has missed several doses due to infection and pancytopenia      06/24/2016 - 08/01/2016 Radiation Therapy    She completed concurrent radiation therapy Radiation treatment dates:   IMRT : 06/24/16 - 08/01/16 HDR : 08/13/16, 08/20/16, 08/27/16, 09/04/16  Site/dose:   Pelvis treated to 55 Gy in 25 fractions (simultaneous integrated boost technique) Vaginal Cuff treated to 24 Gy in 4 fractions      08/15/2016 - 12/03/2016 Chemotherapy    She received 6 cycles of carboplatin/Taxol      09/27/2016 Imaging    Interval decrease in size of previously described solid peritoneal nodule within the left anterior pelvis. Near complete resolution of previously described low-attenuation structure along the left pelvic sidewall. No evidence for metastatic disease in the chest. Aortic atherosclerosis.      12/30/2016 Imaging    Ct abdomen 1. Solitary left pelvic peritoneal implant is mildly decreased in size in the  interval. 2. No new or progressive metastatic disease in the abdomen or pelvis. No ascites. 3. Aortic atherosclerosis.      04/02/2017 Imaging    Left  lower quadrant peritoneal implant referenced on previous exam measures 1.7 x 1.3 cm, image 69 of series 2. Increased from 0.8 x 0.8 cm previously peer no new peritoneal implants identified.  Musculoskeletal: The degenerative disc disease noted within the lumbar spine.  IMPRESSION: 1. Solitary left pelvic peritoneal implant is increased in size in the interval. 2. No new sites of disease.  No ascites. 3. Aortic atherosclerosis      04/11/2017 PET scan    1. The left side of pelvis peritoneal implant has decreased in size and degree of FDG uptake compatible with response to therapy. No new areas of peritoneal disease identified. 2. Persistent diffuse increased uptake within the thyroid gland. Correlation with patient's thyroid function may be helpful.      05/26/2017 Imaging    1. Enlarging tumor implant along the left adnexa, currently 2.6 by 2.4 cm and previously 1.9 by 1.3 cm. No new tumor implant or other specific cause for the patient's pelvic symptoms is currently identified. 2.  Aortic Atherosclerosis (ICD10-I70.0). 3. Lumbar spondylosis and degenerative disc disease causing multilevel impingement.      06/04/2017 -  Chemotherapy    The patient started letrozole and everolimus       INTERVAL HISTORY: Please see below for problem oriented charting. She returns for further follow-up She is doing well She had menopausal hot flashes but not debilitating She has some minimum mood swings but denies significant depression or suicidal ideation  she continues to have intermittent abdominal pain but she averaged only 2 Dilaudid per day Dilaudid is able to control her pain adequately She takes laxatives on a regular basis to avoid severe constipation She denies mucositis or nausea No recent infection  REVIEW OF SYSTEMS:   Constitutional: Denies fevers, chills or abnormal weight loss Eyes: Denies blurriness of vision Ears, nose, mouth, throat, and face: Denies mucositis or  sore throat Respiratory: Denies cough, dyspnea or wheezes Cardiovascular: Denies palpitation, chest discomfort or lower extremity swelling Skin: Denies abnormal skin rashes Lymphatics: Denies new lymphadenopathy or easy bruising Neurological:Denies numbness, tingling or new weaknesses Behavioral/Psych: Mood is stable, no new changes  All other systems were reviewed with the patient and are negative.  I have reviewed the past medical history, past surgical history, social history and family history with the patient and they are unchanged from previous note.  ALLERGIES:  is allergic to latex.  MEDICATIONS:  Current Outpatient Medications  Medication Sig Dispense Refill  . ALPRAZolam (XANAX) 1 MG tablet Take one tablet at bedtime as needed for insomnia and restless legs.    Marland Kitchen amoxicillin (AMOXIL) 500 MG capsule Take 2,000 mg by mouth See admin instructions. Prior to dental appointment, takes 4 tables prior to appt    . Biotin w/ Vitamins C & E (HAIR/SKIN/NAILS PO) Take 1 tablet by mouth daily.    . Calcium Carb-Cholecalciferol (CALCIUM 600 + D PO) Take 2 tablets by mouth daily.    . chlorhexidine (PERIDEX) 0.12 % solution Use as directed 15 mLs in the mouth or throat 2 (two) times daily. 120 mL 0  . clonazePAM (KLONOPIN) 0.5 MG tablet Take 1 tablet (0.5 mg total) by mouth daily as needed (restless legs). (Patient taking differently: Take 0.5 mg by mouth at bedtime as needed (restless legs). ) 60 tablet 1  . dexamethasone (DECADRON) 0.5 MG/5ML  solution Swish 81m in mouth for 24m and spit out. Use 4 times daily for 8 weeks. Start with Afinitor. Avoid eating/drinking for 1hr after rinse. 500 mL 2  . everolimus (AFINITOR) 10 MG tablet Take 1 tablet (10 mg total) by mouth daily. 30 tablet 11  . fluticasone (FLONASE) 50 MCG/ACT nasal spray Place 1 spray into both nostrils 2 (two) times daily as needed for allergies.   1  . gabapentin (NEURONTIN) 600 MG tablet TAKE 1 TABLET BY MOUTH TWICE A DAY  (Patient taking differently: Take 1 tablet 2 times a day) 60 tablet 9  . HYDROmorphone (DILAUDID) 4 MG tablet Take 1 tablet (4 mg total) by mouth every 4 (four) hours as needed for severe pain. 60 tablet 0  . ibuprofen (ADVIL,MOTRIN) 200 MG tablet Take 400 mg by mouth every 8 (eight) hours as needed for mild pain.    . Marland Kitchenetrozole (FEMARA) 2.5 MG tablet Take 1 tablet (2.5 mg total) by mouth daily. 90 tablet 1  . levothyroxine (SYNTHROID, LEVOTHROID) 88 MCG tablet Take 88 mcg by mouth daily.  3  . loratadine (CLARITIN) 10 MG tablet Take 10 mg by mouth daily.    . Marland KitchenORazepam (ATIVAN) 1 MG tablet Take 1 mg by mouth at bedtime as needed for sleep.    . Marland Kitchenosartan-hydrochlorothiazide (HYZAAR) 100-12.5 MG tablet Take 1/2 tablet daily for hypertension.    . Magnesium 250 MG TABS Take 250 mg by mouth daily.    . Melatonin 10 MG CAPS Take 10 mg by mouth at bedtime.    . ondansetron (ZOFRAN) 8 MG tablet Take 1 tablet (8 mg total) by mouth every 8 (eight) hours as needed for nausea. 60 tablet 1  . potassium gluconate (HM POTASSIUM) 595 (99 K) MG TABS tablet Take 595 mg by mouth daily.    . prochlorperazine (COMPAZINE) 10 MG tablet Take 1 tablet (10 mg total) by mouth every 6 (six) hours as needed for nausea. 20 tablet 0  . traZODone (DESYREL) 50 MG tablet Take 50 mg by mouth at bedtime.      No current facility-administered medications for this visit.     PHYSICAL EXAMINATION: ECOG PERFORMANCE STATUS: 1 - Symptomatic but completely ambulatory  Vitals:   06/12/17 1429  BP: (!) 161/72  Pulse: 78  Resp: 18  Temp: 98.7 F (37.1 C)  SpO2: 99%   Filed Weights   06/12/17 1429  Weight: 213 lb 11.2 oz (96.9 kg)    GENERAL:alert, no distress and comfortable SKIN: skin color, texture, turgor are normal, no rashes or significant lesions EYES: normal, Conjunctiva are pink and non-injected, sclera clear OROPHARYNX:no exudate, no erythema and lips, buccal mucosa, and tongue normal  NECK: supple, thyroid  normal size, non-tender, without nodularity LYMPH:  no palpable lymphadenopathy in the cervical, axillary or inguinal LUNGS: clear to auscultation and percussion with normal breathing effort HEART: regular rate & rhythm and no murmurs and no lower extremity edema ABDOMEN:abdomen soft, non-tender and normal bowel sounds Musculoskeletal:no cyanosis of digits and no clubbing  NEURO: alert & oriented x 3 with fluent speech, no focal motor/sensory deficits  LABORATORY DATA:  I have reviewed the data as listed    Component Value Date/Time   NA 138 06/12/2017 1403   K 3.6 06/12/2017 1403   CL 106 08/30/2016 0527   CO2 25 06/12/2017 1403   GLUCOSE 89 06/12/2017 1403   BUN 19.1 06/12/2017 1403   CREATININE 0.8 06/12/2017 1403   CALCIUM 9.6 06/12/2017 1403   PROT  7.5 06/12/2017 1403   ALBUMIN 3.6 06/12/2017 1403   AST 19 06/12/2017 1403   ALT 15 06/12/2017 1403   ALKPHOS 143 06/12/2017 1403   BILITOT 0.46 06/12/2017 1403   GFRNONAA >60 08/31/2016 0539   GFRAA >60 08/31/2016 0539    No results found for: SPEP, UPEP  Lab Results  Component Value Date   WBC 6.8 06/12/2017   NEUTROABS 5.1 06/12/2017   HGB 12.1 06/12/2017   HCT 35.4 06/12/2017   MCV 81.4 06/12/2017   PLT 184 06/12/2017      Chemistry      Component Value Date/Time   NA 138 06/12/2017 1403   K 3.6 06/12/2017 1403   CL 106 08/30/2016 0527   CO2 25 06/12/2017 1403   BUN 19.1 06/12/2017 1403   CREATININE 0.8 06/12/2017 1403      Component Value Date/Time   CALCIUM 9.6 06/12/2017 1403   ALKPHOS 143 06/12/2017 1403   AST 19 06/12/2017 1403   ALT 15 06/12/2017 1403   BILITOT 0.46 06/12/2017 1403       RADIOGRAPHIC STUDIES: I have personally reviewed the radiological images as listed and agreed with the findings in the report. Ct Abdomen Pelvis W Contrast  Result Date: 05/26/2017 CLINICAL DATA:  Lower pelvic pain for 2 weeks. History of endometrial cancer diagnosed in November 2017, radiation therapy  finished in June 2018, known tumor implant in the left hemipelvis. EXAM: CT ABDOMEN AND PELVIS WITH CONTRAST TECHNIQUE: Multidetector CT imaging of the abdomen and pelvis was performed using the standard protocol following bolus administration of intravenous contrast. CONTRAST:  170m ISOVUE-300 IOPAMIDOL (ISOVUE-300) INJECTION 61% COMPARISON:  Multiple exams, including 04/11/2017 PET-CT FINDINGS: Lower chest: Mild cardiomegaly. Hepatobiliary: Stable 7 mm hypodense lesion in the right hepatic lobe on image 15/2, not previously hypermetabolic. Likely a cyst or similar benign lesion. Otherwise unremarkable. Pancreas: Unremarkable Spleen: Unremarkable Adrenals/Urinary Tract: Unremarkable Stomach/Bowel: Unremarkable Vascular/Lymphatic: Aortoiliac atherosclerotic vascular disease. No pathologic adenopathy. Reproductive: Hysterectomy. The tumor implant along the left adnexa region continues to enlarge, currently 2.4 by 2.6 cm and previously 1.9 by 1.3 cm. Other: No supplemental non-categorized findings. Musculoskeletal: Lumbar spondylosis and degenerative disc disease causing foraminal impingement bilaterally at L4-5 and L5-S1, and with displacement of the lateral extraforaminal portions of the left L2 and L3 nerves at the L2-3 and L3-4 levels. Small umbilical hernia contains adipose tissue. IMPRESSION: 1. Enlarging tumor implant along the left adnexa, currently 2.6 by 2.4 cm and previously 1.9 by 1.3 cm. No new tumor implant or other specific cause for the patient's pelvic symptoms is currently identified. 2.  Aortic Atherosclerosis (ICD10-I70.0). 3. Lumbar spondylosis and degenerative disc disease causing multilevel impingement. Electronically Signed   By: WVan ClinesM.D.   On: 05/26/2017 16:21    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 So far, she tolerated treatment well except for persistent abdominal pain, changes in bowel habits and hot flashes I recommend weekly blood draw for  the next few weeks and then I see her back a month from now Plan to repeat imaging study in 3 months, next due around February 2019   Essential hypertension Her blood pressure is elevated. Could be due to whitecoat hypertension. I recommend close monitoring only.  Hot flashes She has significant hot flashes secondary to anti-estrogen therapy It is not debilitating She denies significant mood swings or depression Continue observation only  Insomnia disorder She has significant insomnia I recommend she takes Klonopin daily at night She will continue trazodone  at 50 mg In the future, I can consider increasing the dose of her medications  Cancer associated pain She has intermittent abdominal pain secondary to cancer She will continue Dilaudid as needed I warned her about risk of constipation and advised her to take laxatives regularly.   No orders of the defined types were placed in this encounter.  All questions were answered. The patient knows to call the clinic with any problems, questions or concerns. No barriers to learning was detected. I spent 15 minutes counseling the patient face to face. The total time spent in the appointment was 20 minutes and more than 50% was on counseling and review of test results     Heath Lark, MD 06/12/2017 3:19 PM

## 2017-06-12 NOTE — Assessment & Plan Note (Signed)
She has significant hot flashes secondary to anti-estrogen therapy It is not debilitating She denies significant mood swings or depression Continue observation only

## 2017-06-17 ENCOUNTER — Telehealth: Payer: Self-pay | Admitting: Pharmacist

## 2017-06-17 NOTE — Telephone Encounter (Signed)
Oral Chemotherapy Pharmacist Encounter  Oral Oncology Patient Advocate s/w patient today to follow-up on manufacturer assistance for Afinitor. Assistance application still in process. Patient will be at [facility] on 06/20/17 for lab check. 1 week of samples will be dispensed to patient at that time.  Medication: Afinitor 5mg  tablets Instructions: Take 2 tablets (10mg  total) by mouth once daily with or without food at approximately the same time each day. Quantity dispensed: 14 Days supply: 7 Manufacturer: [employer] Lot: [id number] Exp: 07/2018  Johny Drilling, PharmD, BCPS, BCOP 06/17/2017 2:22 PM Oral Oncology Clinic [phone]

## 2017-06-19 ENCOUNTER — Other Ambulatory Visit: Payer: Self-pay | Admitting: Radiation Oncology

## 2017-06-19 ENCOUNTER — Telehealth: Payer: Self-pay | Admitting: Oncology

## 2017-06-19 MED ORDER — LORAZEPAM 1 MG PO TABS: 1.0000 mg | ORAL_TABLET | Freq: Every evening | ORAL | 0 refills | Status: DC | PRN

## 2017-06-19 NOTE — Telephone Encounter (Signed)
Called patient regarding request from [facility] for lorazepam that was last filled on 07/16/16.  She said she is occasionally taking it and ran out.  She would like a refill if possible.

## 2017-06-19 NOTE — Telephone Encounter (Signed)
Oral Oncology Patient Advocate Encounter  Received notification from [employer] Patient Assistance program that patient has been successfully enrolled into their program to receive Afinitor from the manufacturer at $0 out of pocket until 09/17/2017.  Enrollment for full 12 months is still pending.   I have spoken with the patient.  Oral Oncology Clinic will continue to follow for full enrollment.    Gilmore Laroche, CPhT, [place] Oral Oncology Patient Advocate [phone] 06/19/2017 11:09 AM

## 2017-06-19 NOTE — Telephone Encounter (Signed)
Will she get her own supply now? She is coming in weekly for labs

## 2017-06-20 ENCOUNTER — Other Ambulatory Visit (HOSPITAL_BASED_OUTPATIENT_CLINIC_OR_DEPARTMENT_OTHER): Payer: Medicare Other

## 2017-06-20 DIAGNOSIS — C541 Malignant neoplasm of endometrium: Secondary | ICD-10-CM | POA: Diagnosis present

## 2017-06-20 LAB — COMPREHENSIVE METABOLIC PANEL
ALBUMIN: 3.4 g/dL — AB (ref 3.5–5.0)
ALK PHOS: 122 U/L (ref 40–150)
ALT: 15 U/L (ref 0–55)
AST: 17 U/L (ref 5–34)
Anion Gap: 12 mEq/L — ABNORMAL HIGH (ref 3–11)
BUN: 17.9 mg/dL (ref 7.0–26.0)
CALCIUM: 9.5 mg/dL (ref 8.4–10.4)
CO2: 24 mEq/L (ref 22–29)
Chloride: 104 mEq/L (ref 98–109)
Creatinine: 0.8 mg/dL (ref 0.6–1.1)
Glucose: 81 mg/dl (ref 70–140)
POTASSIUM: 3.9 meq/L (ref 3.5–5.1)
Sodium: 140 mEq/L (ref 136–145)
Total Bilirubin: 0.38 mg/dL (ref 0.20–1.20)
Total Protein: 7.3 g/dL (ref 6.4–8.3)

## 2017-06-20 LAB — CBC WITH DIFFERENTIAL/PLATELET
BASO%: 0.9 % (ref 0.0–2.0)
Basophils Absolute: 0 10*3/uL (ref 0.0–0.1)
EOS%: 3.5 % (ref 0.0–7.0)
Eosinophils Absolute: 0.2 10*3/uL (ref 0.0–0.5)
HEMATOCRIT: 34.1 % — AB (ref 34.8–46.6)
HGB: 11.4 g/dL — ABNORMAL LOW (ref 11.6–15.9)
LYMPH#: 1 10*3/uL (ref 0.9–3.3)
LYMPH%: 19.2 % (ref 14.0–49.7)
MCH: 26.7 pg (ref 25.1–34.0)
MCHC: 33.3 g/dL (ref 31.5–36.0)
MCV: 80.1 fL (ref 79.5–101.0)
MONO#: 0.3 10*3/uL (ref 0.1–0.9)
MONO%: 6.1 % (ref 0.0–14.0)
NEUT%: 70.3 % (ref 38.4–76.8)
NEUTROS ABS: 3.7 10*3/uL (ref 1.5–6.5)
PLATELETS: 131 10*3/uL — AB (ref 145–400)
RBC: 4.25 10*6/uL (ref 3.70–5.45)
RDW: 13 % (ref 11.2–14.5)
WBC: 5.2 10*3/uL (ref 3.9–10.3)

## 2017-06-25 NOTE — Telephone Encounter (Signed)
Oral Oncology Patient Advocate Encounter  Spoke with a representative from [employer] Patient Assistance program.  I was informed that the patient has been successfully enrolled into their program to receive Afinitor from the manufacturer at $0 out of pocket until 07/07/2018.   Mrs Gurr is aware that she will receive her first shipment of medication on Friday 06/27/2017.  She stated that she does have enough medication to last until that shipment arrives.    He knows we will have to re-apply for 2020 enrollment.   Patient knows to call the office with any additional questions or concerns.  Oral Oncology Clinic will continue to follow.  Gilmore Laroche, CPhT, [place] Oral Oncology Patient Advocate [phone] 06/25/2017 10:05 AM

## 2017-06-26 ENCOUNTER — Encounter (HOSPITAL_COMMUNITY): Payer: Self-pay

## 2017-06-27 ENCOUNTER — Other Ambulatory Visit (HOSPITAL_BASED_OUTPATIENT_CLINIC_OR_DEPARTMENT_OTHER): Payer: Medicare Other

## 2017-06-27 DIAGNOSIS — C541 Malignant neoplasm of endometrium: Secondary | ICD-10-CM

## 2017-06-27 LAB — CBC WITH DIFFERENTIAL/PLATELET
BASO%: 0.9 % (ref 0.0–2.0)
Basophils Absolute: 0 10*3/uL (ref 0.0–0.1)
EOS%: 3.6 % (ref 0.0–7.0)
Eosinophils Absolute: 0.2 10*3/uL (ref 0.0–0.5)
HEMATOCRIT: 33.4 % — AB (ref 34.8–46.6)
HEMOGLOBIN: 11.2 g/dL — AB (ref 11.6–15.9)
LYMPH#: 1 10*3/uL (ref 0.9–3.3)
LYMPH%: 19.7 % (ref 14.0–49.7)
MCH: 26.6 pg (ref 25.1–34.0)
MCHC: 33.5 g/dL (ref 31.5–36.0)
MCV: 79.3 fL — ABNORMAL LOW (ref 79.5–101.0)
MONO#: 0.4 10*3/uL (ref 0.1–0.9)
MONO%: 7.7 % (ref 0.0–14.0)
NEUT#: 3.4 10*3/uL (ref 1.5–6.5)
NEUT%: 68.1 % (ref 38.4–76.8)
Platelets: 136 10*3/uL — ABNORMAL LOW (ref 145–400)
RBC: 4.21 10*6/uL (ref 3.70–5.45)
RDW: 13 % (ref 11.2–14.5)
WBC: 4.9 10*3/uL (ref 3.9–10.3)

## 2017-06-27 LAB — COMPREHENSIVE METABOLIC PANEL
ALK PHOS: 125 U/L (ref 40–150)
ALT: 24 U/L (ref 0–55)
ANION GAP: 9 meq/L (ref 3–11)
AST: 25 U/L (ref 5–34)
Albumin: 3.3 g/dL — ABNORMAL LOW (ref 3.5–5.0)
BILIRUBIN TOTAL: 0.4 mg/dL (ref 0.20–1.20)
BUN: 15.9 mg/dL (ref 7.0–26.0)
CO2: 27 meq/L (ref 22–29)
Calcium: 9.1 mg/dL (ref 8.4–10.4)
Chloride: 102 mEq/L (ref 98–109)
Creatinine: 0.7 mg/dL (ref 0.6–1.1)
Glucose: 89 mg/dl (ref 70–140)
POTASSIUM: 3.9 meq/L (ref 3.5–5.1)
Sodium: 138 mEq/L (ref 136–145)
TOTAL PROTEIN: 6.9 g/dL (ref 6.4–8.3)

## 2017-06-28 LAB — LIPID PANEL
CHOL/HDL RATIO: 3.8 ratio (ref 0.0–4.4)
CHOLESTEROL TOTAL: 188 mg/dL (ref 100–199)
HDL: 49 mg/dL (ref >39)
LDL CALC: 78 mg/dL (ref 0–99)
TRIGLYCERIDES: 304 mg/dL — AB (ref 0–149)
VLDL CHOLESTEROL CAL: 61 mg/dL — AB (ref 5–40)

## 2017-07-07 ENCOUNTER — Encounter: Payer: Self-pay | Admitting: Radiation Oncology

## 2017-07-07 ENCOUNTER — Ambulatory Visit
Admission: RE | Admit: 2017-07-07 | Discharge: 2017-07-07 | Disposition: A | Discharge status: Home / Self Care | Payer: Medicare Other | Source: Ambulatory Visit | Attending: Radiation Oncology | Admitting: Radiation Oncology

## 2017-07-07 ENCOUNTER — Other Ambulatory Visit: Payer: Self-pay

## 2017-07-07 DIAGNOSIS — Z79899 Other long term (current) drug therapy: Secondary | ICD-10-CM | POA: Insufficient documentation

## 2017-07-07 DIAGNOSIS — C541 Malignant neoplasm of endometrium: Secondary | ICD-10-CM | POA: Diagnosis present

## 2017-07-07 NOTE — Progress Notes (Signed)
Michelle Rosario is here for follow up.  She denies having any pain and said her abdominal pain is better.  She takes dilaudid as needed.  She reports having a cold since 06/30/17.  She saw her PCP on Thursday and was given hycodan.  She reports she is coughing frequently.  She denies having any bladder/bowel issues.  She is taking letrazole and everolimus.  She reports having a poor appetite for the past month.  She reports she started using a vaginal dilator this week and had some spotting.  She reports having fatigue.  BP (!) 114/53 (BP Location: Right Arm, Patient Position: Sitting)   Pulse 89   Temp 98.5 F (36.9 C) (Oral)   Ht 5' 5.5" (1.664 m)   Wt 210 lb 12.8 oz (95.6 kg)   SpO2 97%   BMI 34.55 kg/m    Wt Readings from Last 3 Encounters:  07/07/17 210 lb 12.8 oz (95.6 kg)  06/12/17 213 lb 11.2 oz (96.9 kg)  05/27/17 216 lb 1.6 oz (98 kg)

## 2017-07-07 NOTE — Progress Notes (Signed)
Radiation Oncology         [phone] ________________________________  Name: Michelle Rosario MRN: [id number]  Date: 07/07/2017  DOB: 31-Jul-1945  Follow-Up Visit Note  CC: [name], MD  Marti Sleigh    ICD-10-CM   1. 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  She unfortunately had persistent disease after her external beam and brachytherapy treatments. The patient has recently started on letrozole and  Everolimus. She seems to be tolerating this combination well at this time.  She has been bothered recently by a upper respiratory illness. Patient was given Hycodan for her coughing by her primary care physician late last week.                          ALLERGIES:  is allergic to latex.  Meds: Current Outpatient Medications  Medication Sig Dispense Refill  . ALPRAZolam (XANAX) 1 MG tablet Take one tablet at bedtime as needed for insomnia and restless legs.    . Biotin w/ Vitamins C & E (HAIR/SKIN/NAILS PO) Take 1 tablet by mouth daily.    . Calcium Carb-Cholecalciferol (CALCIUM 600 + D PO) Take 2 tablets by mouth daily.    . clonazePAM (KLONOPIN) 0.5 MG tablet Take 1 tablet (0.5 mg total) by mouth daily as needed (restless legs). (Patient taking differently: Take 0.5 mg by mouth at bedtime as needed (restless legs). ) 60 tablet 1  . dexamethasone (DECADRON) 0.5 MG/5ML solution Swish 61mL in mouth for 8min and spit out. Use 4 times daily for 8 weeks. Start with Afinitor. Avoid eating/drinking for 1hr after rinse. 500 mL 2  . everolimus (AFINITOR) 10 MG tablet Take 1  tablet (10 mg total) by mouth daily. 30 tablet 11  . fluticasone (FLONASE) 50 MCG/ACT nasal spray Place 1 spray into both nostrils 2 (two) times daily as needed for allergies.   1  . gabapentin (NEURONTIN) 600 MG tablet TAKE 1 TABLET BY MOUTH TWICE A DAY (Patient taking differently: Take 1 tablet 2 times a day) 60 tablet 9  . HYDROcodone-homatropine (HYCODAN) 5-1.5 MG/5ML syrup Take by mouth.    Marland Kitchen HYDROmorphone (DILAUDID) 4 MG tablet Take 1 tablet (4 mg total) by mouth every 4 (four) hours as needed for severe pain. 60 tablet 0  . letrozole (FEMARA) 2.5 MG tablet Take 1 tablet (2.5 mg total) by mouth daily. 90 tablet 1  . levothyroxine (SYNTHROID, LEVOTHROID) 88 MCG tablet Take 88 mcg by mouth daily.  3  . loratadine (CLARITIN) 10 MG tablet Take 10 mg by mouth daily.    Marland Kitchen LORazepam (ATIVAN) 1 MG tablet Take 1 tablet (1 mg total) by mouth at bedtime as needed for sleep. 30 tablet 0  . losartan-hydrochlorothiazide (HYZAAR) 100-12.5 MG tablet Take 1/2 tablet daily for hypertension.    . Magnesium 250 MG TABS Take 250 mg by mouth daily.    . Melatonin 10 MG CAPS Take 10 mg by mouth at bedtime.    . ondansetron (ZOFRAN) 8 MG tablet Take 1 tablet (8 mg total) by mouth  every 8 (eight) hours as needed for nausea. 60 tablet 1  . potassium gluconate (HM POTASSIUM) 595 (99 K) MG TABS tablet Take 595 mg by mouth daily.    . prochlorperazine (COMPAZINE) 10 MG tablet Take 1 tablet (10 mg total) by mouth every 6 (six) hours as needed for nausea. 20 tablet 0  . traZODone (DESYREL) 50 MG tablet Take 50 mg by mouth at bedtime.     Marland Kitchen amoxicillin (AMOXIL) 500 MG capsule Take 2,000 mg by mouth See admin instructions. Prior to dental appointment, takes 4 tables prior to appt    . chlorhexidine (PERIDEX) 0.12 % solution Use as directed 15 mLs in the mouth or throat 2 (two) times daily. (Patient not taking: Reported on 07/07/2017) 120 mL 0  . ibuprofen (ADVIL,MOTRIN) 200 MG tablet Take 400 mg by mouth every 8 (eight)  hours as needed for mild pain.     No current facility-administered medications for this encounter.     Physical Findings: The patient is in no acute distress. Patient is alert and oriented.  height is 5' 5.5" (1.664 m) and weight is 210 lb 12.8 oz (95.6 kg). Her oral temperature is 98.5 F (36.9 C). Her blood pressure is 114/53 (abnormal) and her pulse is 89. Her oxygen saturation is 97%. . Lungs are clear to auscultation bilaterally. Heart has regular rate and rhythm. No palpable cervical, supraclavicular, or axillary adenopathy. Abdomen soft, non-tender, normal bowel sounds. On pelvic examination the external genitalia were unremarkable. A speculum exam was performed. There are no mucosal lesions noted in the vaginal vault except for a questionable 5 mm lesion along the right lateral mid vaginal vault . On bimanual and rectovaginal examination there were no pelvic masses appreciated except possibly a 5 mm palpable lesion along the right mid vaginal vault.  Lab Findings: Lab Results  Component Value Date   WBC 4.9 06/27/2017   HGB 11.2 (L) 06/27/2017   HCT 33.4 (L) 06/27/2017   MCV 79.3 (L) 06/27/2017   PLT 136 (L) 06/27/2017    Radiographic Findings: CT scan from 05/26/2017 IMPRESSION: 1. Enlarging tumor implant along the left adnexa, currently 2.6 by 2.4 cm and previously 1.9 by 1.3 cm. No new tumor implant or other specific cause for the patient's pelvic symptoms is currently identified.   Impression:  Recurrent endometrial cancer. The patient has had persistent disease in the left pelvis region after her radiation therapy. She is on above therapy and seems to be tolerating well.  Plan:  She'll proceed with additional imaging in the spring. Routine follow-up in addition oncology in 3-4 months unless she is seen by Dr. Denman George  ____________________________________ Gery Pray, MD

## 2017-07-09 ENCOUNTER — Other Ambulatory Visit (HOSPITAL_BASED_OUTPATIENT_CLINIC_OR_DEPARTMENT_OTHER): Payer: Medicare Other

## 2017-07-09 ENCOUNTER — Telehealth: Payer: Self-pay | Admitting: *Deleted

## 2017-07-09 DIAGNOSIS — C541 Malignant neoplasm of endometrium: Secondary | ICD-10-CM | POA: Diagnosis present

## 2017-07-09 LAB — COMPREHENSIVE METABOLIC PANEL
ALK PHOS: 182 U/L — AB (ref 40–150)
ALT: 43 U/L (ref 0–55)
ANION GAP: 9 meq/L (ref 3–11)
AST: 35 U/L — ABNORMAL HIGH (ref 5–34)
Albumin: 3.1 g/dL — ABNORMAL LOW (ref 3.5–5.0)
BILIRUBIN TOTAL: 0.34 mg/dL (ref 0.20–1.20)
BUN: 16.4 mg/dL (ref 7.0–26.0)
CO2: 27 mEq/L (ref 22–29)
Calcium: 9.2 mg/dL (ref 8.4–10.4)
Chloride: 105 mEq/L (ref 98–109)
Creatinine: 0.8 mg/dL (ref 0.6–1.1)
Glucose: 109 mg/dl (ref 70–140)
Potassium: 3.9 mEq/L (ref 3.5–5.1)
Sodium: 141 mEq/L (ref 136–145)
TOTAL PROTEIN: 7.1 g/dL (ref 6.4–8.3)

## 2017-07-09 LAB — CBC WITH DIFFERENTIAL/PLATELET
BASO%: 0.6 % (ref 0.0–2.0)
Basophils Absolute: 0 10*3/uL (ref 0.0–0.1)
EOS%: 3.4 % (ref 0.0–7.0)
Eosinophils Absolute: 0.2 10*3/uL (ref 0.0–0.5)
HEMATOCRIT: 32.4 % — AB (ref 34.8–46.6)
HGB: 10.9 g/dL — ABNORMAL LOW (ref 11.6–15.9)
LYMPH#: 1 10*3/uL (ref 0.9–3.3)
LYMPH%: 18 % (ref 14.0–49.7)
MCH: 26.7 pg (ref 25.1–34.0)
MCHC: 33.6 g/dL (ref 31.5–36.0)
MCV: 79.2 fL — ABNORMAL LOW (ref 79.5–101.0)
MONO#: 0.4 10*3/uL (ref 0.1–0.9)
MONO%: 7.5 % (ref 0.0–14.0)
NEUT#: 3.8 10*3/uL (ref 1.5–6.5)
NEUT%: 70.5 % (ref 38.4–76.8)
Platelets: 118 10*3/uL — ABNORMAL LOW (ref 145–400)
RBC: 4.09 10*6/uL (ref 3.70–5.45)
RDW: 12.5 % (ref 11.2–14.5)
WBC: 5.3 10*3/uL (ref 3.9–10.3)

## 2017-07-09 NOTE — Telephone Encounter (Signed)
-----   Message from Heath Lark, MD sent at 07/09/2017  2:35 PM EST ----- Regarding: please let her know labs are OK   ----- Message ----- From: Interface, Lab In Three Zero One Sent: 07/09/2017   1:24 PM To: Heath Lark, MD

## 2017-07-09 NOTE — Telephone Encounter (Signed)
Notified of message below

## 2017-07-13 IMAGING — CT CT CHEST W/ CM
2 of 5 series · 12 of 36 positions shown, 15 images · IV contrast (ISOVUE 300)
Comparison: PET-CT 06/12/2016; CT chest abdomen pelvis 05/20/2016

CLINICAL DATA: Patient with history of endometrial carcinoma.
Assess treatment response.

EXAM:
CT CHEST, ABDOMEN, AND PELVIS WITH CONTRAST
TECHNIQUE: Multidetector CT imaging of the chest, abdomen and pelvis was
performed following the standard protocol during bolus
administration of intravenous contrast.
CONTRAST:  100 PQ0PZT-RMM IOPAMIDOL (PQ0PZT-RMM) INJECTION 61%

[Series 2: cap with · axial · 0.87mm/px · z∈[-448,+62]mm · 9 of 128 slices shown, 12 images]
[im 13/128  mediastinal]
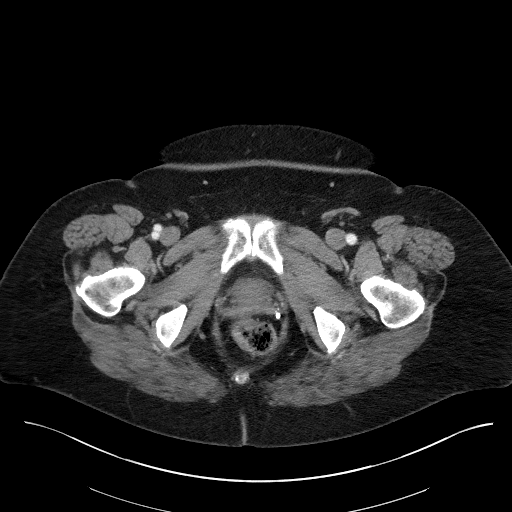
[im 13/128  lung]
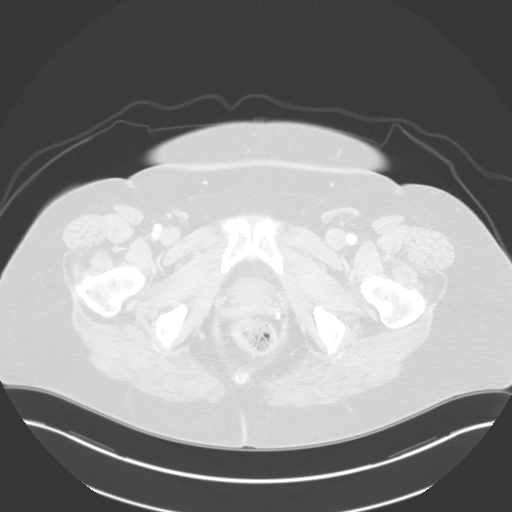
[im 26/128  lung]
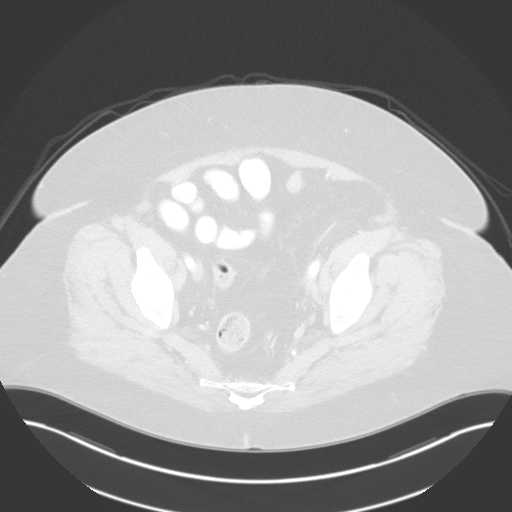
[im 39/128  lung]
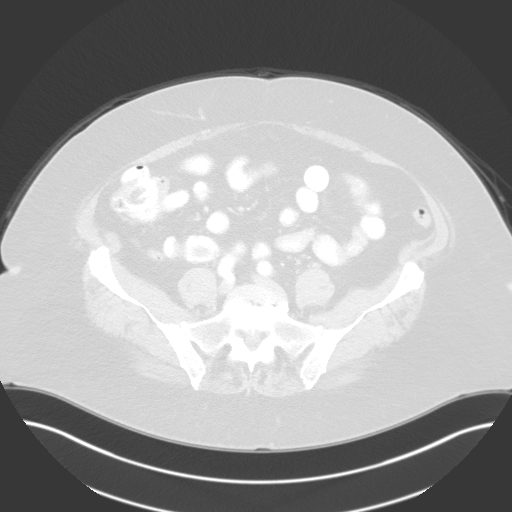
[im 51/128  lung]
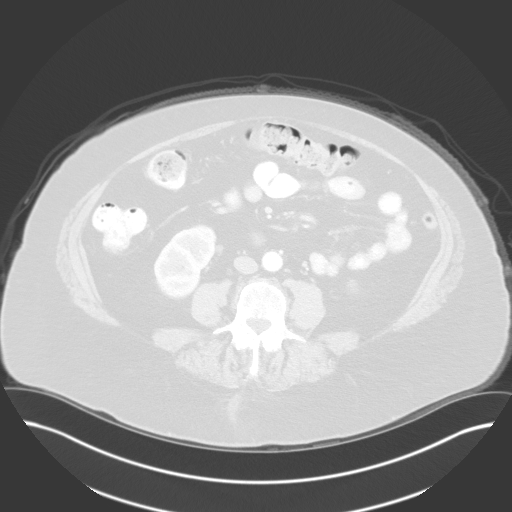
[im 64/128  mediastinal]
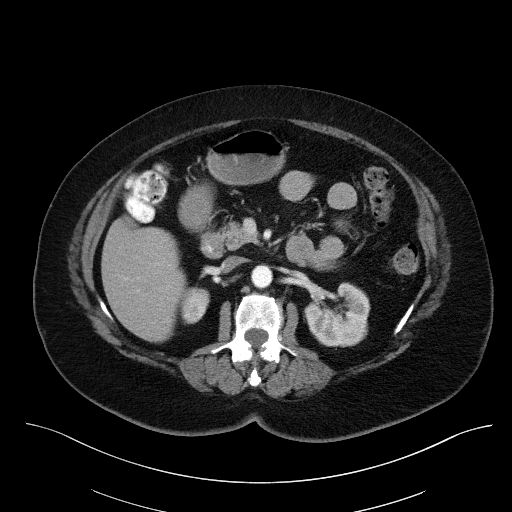
[im 64/128  lung]
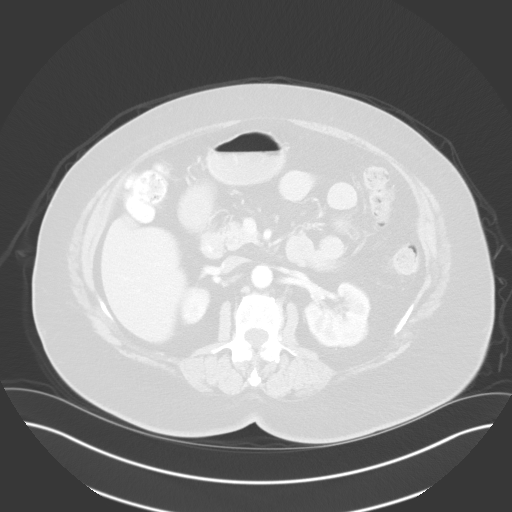
[im 77/128  lung]
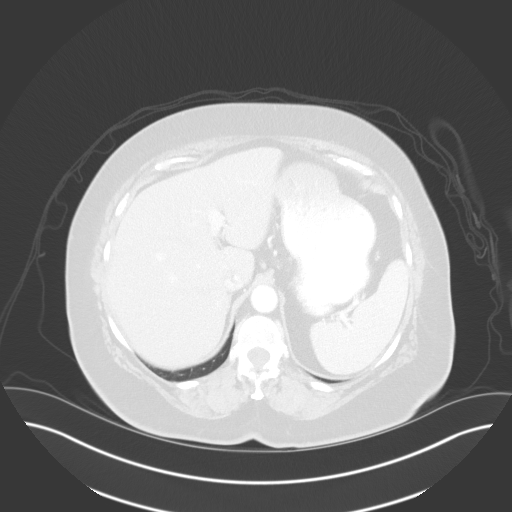
[im 89/128  lung]
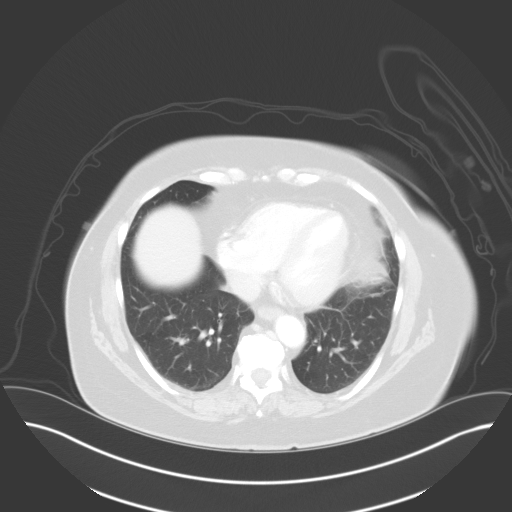
[im 102/128  lung]
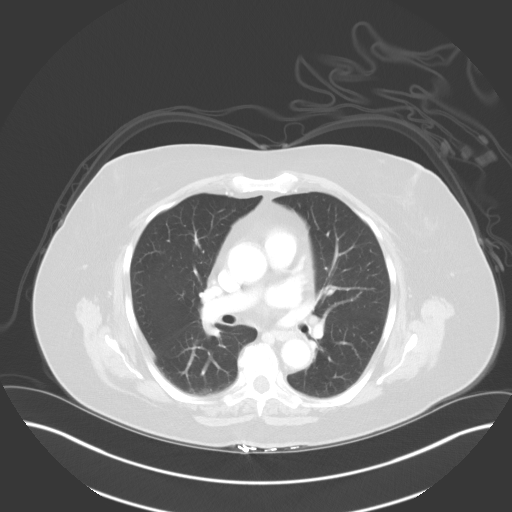
[im 115/128  mediastinal]
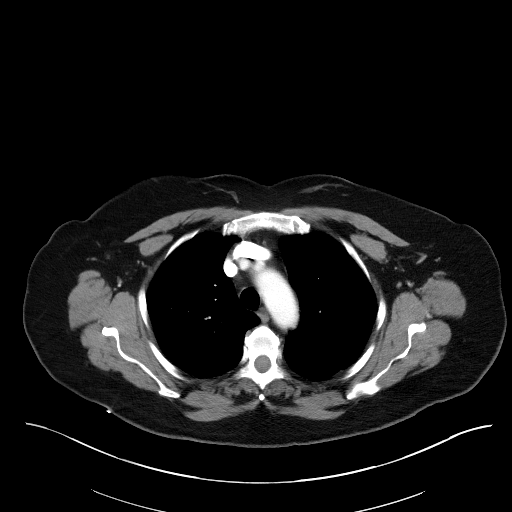
[im 115/128  lung]
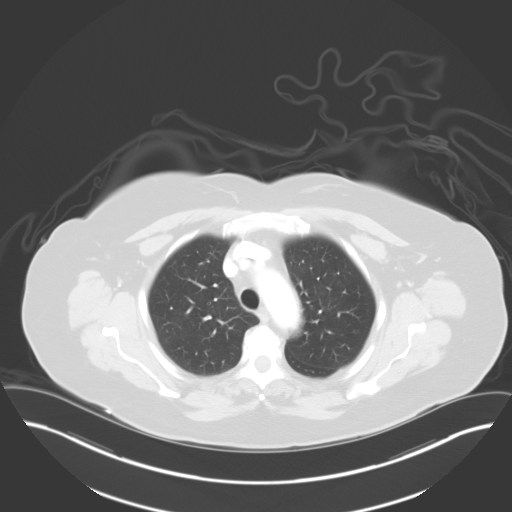

[Series 4: coronals · coronal · 0.84mm/px · 3 of 148 slices shown]
[im 30/148  lung]
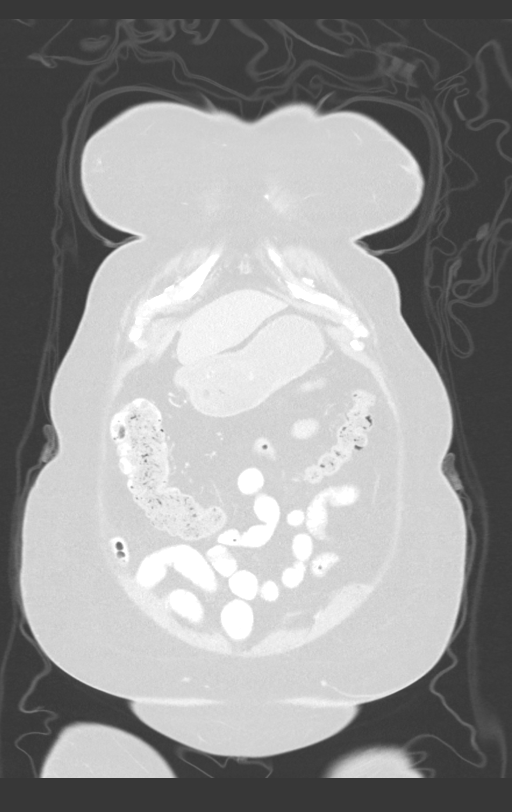
[im 59/148  lung]
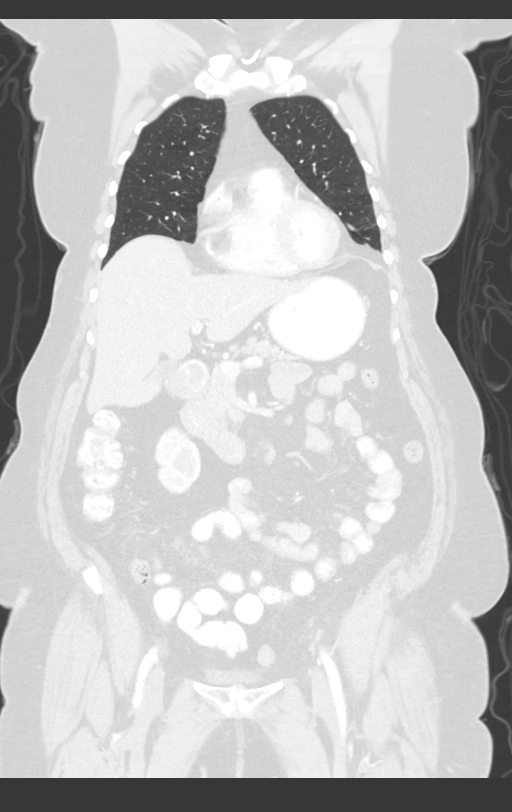
[im 89/148  lung]
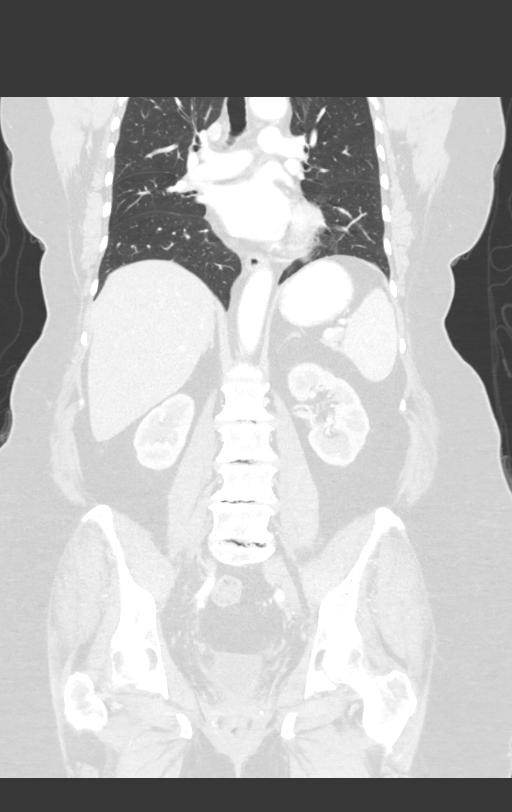

[12 of 36 positions shown; findings below may reference images not displayed]

FINDINGS: CT CHEST FINDINGS

Cardiovascular: Normal heart size. No pericardial effusion. Aorta
and main pulmonary artery normal in caliber.

Mediastinum/Nodes: No enlarged axillary, mediastinal or hilar
lymphadenopathy.

Lungs/Pleura: Central airways are patent. Dependent atelectasis
bilateral lower lobes and lingula. Unchanged 3 mm right upper lobe
pulmonary nodule (image 35; series 6), stable over time, compatible
with benign etiology. No large area of pulmonary consolidation. No
pleural effusion or pneumothorax.

Musculoskeletal: No aggressive or acute appearing osseous lesions.

CT ABDOMEN PELVIS FINDINGS

Hepatobiliary: Stable subcentimeter low-attenuation lesion within
the hepatic dome (image 47; series 2). No new or enlarging hepatic
lesions are identified. Gallbladder is decompressed. No intrahepatic
or extrahepatic biliary ductal dilatation.

Pancreas: Unremarkable

Spleen: Unremarkable

Adrenals/Urinary Tract: The adrenal glands are normal. Kidneys
enhance symmetrically with contrast. No hydronephrosis. Urinary
bladder is unremarkable.

Stomach/Bowel: Normal morphology of the stomach. No abnormal bowel
wall thickening or evidence for bowel obstruction. No free fluid or
free intraperitoneal air. Normal appendix.

Vascular/Lymphatic: Normal caliber abdominal aorta. No
retroperitoneal lymphadenopathy.

Reproductive: Patient status post hysterectomy. Interval decrease in
size of previously described solid nodule within the anterior left
pelvis measuring 1.3 x 0.9 cm (image 102; series 2), previously
x 2.1 cm. Interval decrease and near complete resolution of
previously described adjacent low-attenuation structure along the
pelvic side wall.

Other: None

Musculoskeletal: Lumbar spine degenerative changes. No aggressive or
acute appearing osseous lesions.
IMPRESSION: Interval decrease in size of previously described solid peritoneal
nodule within the left anterior pelvis.

Near complete resolution of previously described low-attenuation
structure along the left pelvic sidewall.

No evidence for metastatic disease in the chest.

Aortic atherosclerosis.

## 2017-07-17 ENCOUNTER — Inpatient Hospital Stay: Payer: Medicare Other | Attending: Hematology and Oncology | Admitting: Hematology and Oncology

## 2017-07-17 ENCOUNTER — Inpatient Hospital Stay: Payer: Medicare Other

## 2017-07-17 VITALS — BP 152/78 | HR 83 | Temp 98.1°F | Resp 18 | Ht 65.5 in | Wt 212.9 lb

## 2017-07-17 DIAGNOSIS — C541 Malignant neoplasm of endometrium: Secondary | ICD-10-CM | POA: Diagnosis present

## 2017-07-17 DIAGNOSIS — T451X5A Adverse effect of antineoplastic and immunosuppressive drugs, initial encounter: Secondary | ICD-10-CM

## 2017-07-17 DIAGNOSIS — G620 Drug-induced polyneuropathy: Secondary | ICD-10-CM

## 2017-07-17 DIAGNOSIS — D61818 Other pancytopenia: Secondary | ICD-10-CM

## 2017-07-17 DIAGNOSIS — N951 Menopausal and female climacteric states: Secondary | ICD-10-CM | POA: Insufficient documentation

## 2017-07-17 DIAGNOSIS — R05 Cough: Secondary | ICD-10-CM | POA: Diagnosis not present

## 2017-07-17 DIAGNOSIS — G893 Neoplasm related pain (acute) (chronic): Secondary | ICD-10-CM | POA: Diagnosis not present

## 2017-07-17 DIAGNOSIS — R059 Cough, unspecified: Secondary | ICD-10-CM

## 2017-07-17 DIAGNOSIS — D61810 Antineoplastic chemotherapy induced pancytopenia: Secondary | ICD-10-CM | POA: Insufficient documentation

## 2017-07-17 LAB — CBC WITH DIFFERENTIAL/PLATELET
Basophils Absolute: 0 10*3/uL (ref 0.0–0.1)
Basophils Relative: 0 %
EOS ABS: 0 10*3/uL (ref 0.0–0.5)
EOS PCT: 0 %
HCT: 34 % — ABNORMAL LOW (ref 34.8–46.6)
Hemoglobin: 11.2 g/dL — ABNORMAL LOW (ref 11.6–15.9)
LYMPHS ABS: 0.9 10*3/uL (ref 0.9–3.3)
Lymphocytes Relative: 8 %
MCH: 26 pg (ref 25.1–34.0)
MCHC: 32.9 g/dL (ref 31.5–36.0)
MCV: 78.9 fL — AB (ref 79.5–101.0)
Monocytes Absolute: 0.3 10*3/uL (ref 0.1–0.9)
Monocytes Relative: 3 %
Neutro Abs: 10.3 10*3/uL — ABNORMAL HIGH (ref 1.5–6.5)
Neutrophils Relative %: 89 %
Platelets: 129 10*3/uL — ABNORMAL LOW (ref 145–400)
RBC: 4.31 MIL/uL (ref 3.70–5.45)
RDW: 13.1 % (ref 11.2–16.1)
WBC: 11.5 10*3/uL — ABNORMAL HIGH (ref 3.9–10.3)

## 2017-07-17 LAB — COMPREHENSIVE METABOLIC PANEL
ALBUMIN: 3.3 g/dL — AB (ref 3.5–5.0)
ALT: 28 U/L (ref 0–55)
AST: 21 U/L (ref 5–34)
Alkaline Phosphatase: 142 U/L (ref 40–150)
Anion gap: 12 — ABNORMAL HIGH (ref 3–11)
BUN: 26 mg/dL (ref 7–26)
CHLORIDE: 101 mmol/L (ref 98–109)
CO2: 26 mmol/L (ref 22–29)
CREATININE: 0.81 mg/dL (ref 0.60–1.10)
Calcium: 9.8 mg/dL (ref 8.4–10.4)
GFR calc Af Amer: >60 mL/min (ref >60)
GFR calc non Af Amer: >60 mL/min (ref >60)
GLUCOSE: 113 mg/dL (ref 70–140)
Potassium: 3.9 mmol/L (ref 3.3–4.7)
SODIUM: 139 mmol/L (ref 136–145)
Total Bilirubin: 0.4 mg/dL (ref 0.2–1.2)
Total Protein: 7.1 g/dL (ref 6.4–8.3)

## 2017-07-18 ENCOUNTER — Encounter: Payer: Self-pay | Admitting: Hematology and Oncology

## 2017-07-18 DIAGNOSIS — R05 Cough: Secondary | ICD-10-CM | POA: Insufficient documentation

## 2017-07-18 DIAGNOSIS — R059 Cough, unspecified: Secondary | ICD-10-CM | POA: Insufficient documentation

## 2017-07-18 NOTE — Assessment & Plan Note (Signed)
So far, she tolerated treatment well except for mild hot flashes Her abdominal pain is resolved She has no mucositis or nausea She had recent lower respiratory tract infection and has been prescribed doxycycline, prednisone and cough medicine She is overall improving Peripheral neuropathy is stable We will continue same dose prescription and I plan to see her back again next month with repeat history, physical examination, blood work and imaging study

## 2017-07-18 NOTE — Progress Notes (Signed)
[facility] OFFICE PROGRESS NOTE  Patient Care Team: [name], MD as PCP - General (Family Medicine)  SUMMARY OF ONCOLOGIC HISTORY: Oncology History   The patient is a 72 year old G1P1 who was initially seen in consultation at the request of Dr Pamala Hurry for grade 2 endometrial cancer. A transvaginal ultrasound scan was performed on 09/11/2015. It revealed a uterus measuring 7.2 x 6 x 4.5 cm with a thickened endometrial stripe of 29 mm. The ovaries were normal bilaterally. 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  MSI stable  05/15/16: ER is moderately positive (70%). PR is strongly positive (80%).     Recurrent carcinoma of endometrium ([place])   01/02/2016 Pathology Results    Uterus +/- tubes/ovaries, neoplastic, cervix ENDOMETRIAL ADENOCARCINOMA, FIGO GRADE 2 (4.7 CM) THE TUMOR INVADES LESS THAN ONE-HALF OF THE MYOMETRIUM (PT1A) ALL MARGINS OF RESECTION ARE NEGATIVE FOR CARCINOMA LEIOMYOMAS AND ADENOMYOSIS BILATERAL FALLOPIAN TUBES AND OVARIES: HISTOLOGICAL UNREMARKABLE 2. Lymph node, sentinel, biopsy, right obturator ONE BENIGN LYMPH NODE (0/1) 3. Lymph nodes, regional resection, left pelvic FOUR BENIGN LYMPH NODES (0/4)      01/02/2016 Surgery    Dr. Denman George performed robotic-assisted laparoscopic total hysterectomy with bilateral salpingoophorectomy, sentinel lymph node biopsy, lymphadenectomy        05/15/2016 Pathology Results    Vagina, biopsy, mid - ADENOCARCINOMA, SEE COMMENT. Microscopic Comment The morphology along with the patient's history are consistent with recurrent endometrioid adenocarcinoma. The carcinoma has a similar appearance to the primary ([id number]).      05/20/2016 Imaging    Ct scan abdomen showed solid 2.5 cm peritoneal mass in the mid to anterior left pelvis, suspicious for peritoneal metastasis. 2. Small expansile low-attenuation filling defect in  the left external iliac vein, cannot exclude a small deep venous thrombus. Consider correlation with left lower extremity venous Doppler scan. 3. Small simple fluid density structure in the left pelvic sidewall abutting the left external iliac vessels, favor a small postoperative seroma. 4. No ascites. 5. No lymphadenopathy.  No metastatic disease in the chest. 6. Aortic atherosclerosis.      06/12/2016 PET scan    Intensely hypermetabolic 2.1 cm central pelvic peritoneal mass just to the left of midline, consistent with peritoneal metastatic recurrence. No ascites. 2. No additional hypermetabolic sites of metastatic disease. 3. Diffuse thyroid hypermetabolism without discrete thyroid nodule, favoring thyroiditis. Recommend correlation with serum thyroid function tests.      06/24/2016 - 07/22/2016 Chemotherapy    The patient had weekly cisplatin. She has missed several doses due to infection and pancytopenia      06/24/2016 - 09/04/2016 Radiation Therapy    She completed concurrent radiation therapy Radiation treatment dates:   IMRT : 06/24/16 - 08/01/16 HDR : 08/13/16, 08/20/16, 08/27/16, 09/04/16  Site/dose:   Pelvis treated to 55 Gy in 25 fractions (simultaneous integrated boost technique) Vaginal Cuff treated to 24 Gy in 4 fractions      08/15/2016 - 12/03/2016 Chemotherapy    She received 6 cycles of carboplatin/Taxol      09/27/2016 Imaging    Interval decrease in size of previously described solid peritoneal nodule within the left anterior pelvis. Near complete resolution of previously described low-attenuation structure along the left pelvic sidewall. No evidence for metastatic disease in the chest. Aortic atherosclerosis.      12/30/2016 Imaging    Ct abdomen 1. Solitary left pelvic peritoneal implant is mildly decreased in size in the  interval. 2. No new or progressive metastatic disease in the abdomen or pelvis. No ascites. 3. Aortic atherosclerosis.      04/02/2017 Imaging    Left  lower quadrant peritoneal implant referenced on previous exam measures 1.7 x 1.3 cm, image 69 of series 2. Increased from 0.8 x 0.8 cm previously peer no new peritoneal implants identified.  Musculoskeletal: The degenerative disc disease noted within the lumbar spine.  IMPRESSION: 1. Solitary left pelvic peritoneal implant is increased in size in the interval. 2. No new sites of disease.  No ascites. 3. Aortic atherosclerosis      04/11/2017 PET scan    1. The left side of pelvis peritoneal implant has decreased in size and degree of FDG uptake compatible with response to therapy. No new areas of peritoneal disease identified. 2. Persistent diffuse increased uptake within the thyroid gland. Correlation with patient's thyroid function may be helpful.      05/26/2017 Imaging    1. Enlarging tumor implant along the left adnexa, currently 2.6 by 2.4 cm and previously 1.9 by 1.3 cm. No new tumor implant or other specific cause for the patient's pelvic symptoms is currently identified. 2.  Aortic Atherosclerosis (ICD10-I70.0). 3. Lumbar spondylosis and degenerative disc disease causing multilevel impingement.      06/04/2017 -  Chemotherapy    The patient started letrozole and everolimus       INTERVAL HISTORY: Please see below for problem oriented charting. She returns for further follow-up She was recently diagnosed with lower respiratory tract infection and was prescribed cough syrup, doxycycline and prednisone Overall, she is getting better She has peripheral neuropathy, stable Pelvic pain has improved She denies nausea, mucositis, bloating or changes in bowel habits.  REVIEW OF SYSTEMS:   Constitutional: Denies fevers, chills or abnormal weight loss Eyes: Denies blurriness of vision Ears, nose, mouth, throat, and face: Denies mucositis or sore throat Cardiovascular: Denies palpitation, chest discomfort or lower extremity swelling Gastrointestinal:  Denies nausea,  heartburn or change in bowel habits Skin: Denies abnormal skin rashes Lymphatics: Denies new lymphadenopathy or easy bruising Neurological:Denies numbness, tingling or new weaknesses Behavioral/Psych: Mood is stable, no new changes  All other systems were reviewed with the patient and are negative.  I have reviewed the past medical history, past surgical history, social history and family history with the patient and they are unchanged from previous note.  ALLERGIES:  is allergic to latex.  MEDICATIONS:  Current Outpatient Medications  Medication Sig Dispense Refill  . ALPRAZolam (XANAX) 1 MG tablet Take one tablet at bedtime as needed for insomnia and restless legs.    Marland Kitchen amoxicillin (AMOXIL) 500 MG capsule Take 2,000 mg by mouth See admin instructions. Prior to dental appointment, takes 4 tables prior to appt    . Biotin w/ Vitamins C & E (HAIR/SKIN/NAILS PO) Take 1 tablet by mouth daily.    . Calcium Carb-Cholecalciferol (CALCIUM 600 + D PO) Take 2 tablets by mouth daily.    . chlorhexidine (PERIDEX) 0.12 % solution Use as directed 15 mLs in the mouth or throat 2 (two) times daily. (Patient not taking: Reported on 07/07/2017) 120 mL 0  . clonazePAM (KLONOPIN) 0.5 MG tablet Take 1 tablet (0.5 mg total) by mouth daily as needed (restless legs). (Patient taking differently: Take 0.5 mg by mouth at bedtime as needed (restless legs). ) 60 tablet 1  . dexamethasone (DECADRON) 0.5 MG/5ML solution Swish 49m in mouth for 276m and spit out. Use 4 times daily for 8 weeks.  Start with Afinitor. Avoid eating/drinking for 1hr after rinse. 500 mL 2  . everolimus (AFINITOR) 10 MG tablet Take 1 tablet (10 mg total) by mouth daily. 30 tablet 11  . fluticasone (FLONASE) 50 MCG/ACT nasal spray Place 1 spray into both nostrils 2 (two) times daily as needed for allergies.   1  . gabapentin (NEURONTIN) 600 MG tablet TAKE 1 TABLET BY MOUTH TWICE A DAY (Patient taking differently: Take 1 tablet 2 times a day) 60  tablet 9  . HYDROmorphone (DILAUDID) 4 MG tablet Take 1 tablet (4 mg total) by mouth every 4 (four) hours as needed for severe pain. 60 tablet 0  . ibuprofen (ADVIL,MOTRIN) 200 MG tablet Take 400 mg by mouth every 8 (eight) hours as needed for mild pain.    Marland Kitchen letrozole (FEMARA) 2.5 MG tablet Take 1 tablet (2.5 mg total) by mouth daily. 90 tablet 1  . levothyroxine (SYNTHROID, LEVOTHROID) 88 MCG tablet Take 88 mcg by mouth daily.  3  . loratadine (CLARITIN) 10 MG tablet Take 10 mg by mouth daily.    Marland Kitchen LORazepam (ATIVAN) 1 MG tablet Take 1 tablet (1 mg total) by mouth at bedtime as needed for sleep. 30 tablet 0  . losartan-hydrochlorothiazide (HYZAAR) 100-12.5 MG tablet Take 1/2 tablet daily for hypertension.    . Magnesium 250 MG TABS Take 250 mg by mouth daily.    . Melatonin 10 MG CAPS Take 10 mg by mouth at bedtime.    . ondansetron (ZOFRAN) 8 MG tablet Take 1 tablet (8 mg total) by mouth every 8 (eight) hours as needed for nausea. 60 tablet 1  . potassium gluconate (HM POTASSIUM) 595 (99 K) MG TABS tablet Take 595 mg by mouth daily.    . prochlorperazine (COMPAZINE) 10 MG tablet Take 1 tablet (10 mg total) by mouth every 6 (six) hours as needed for nausea. 20 tablet 0  . traZODone (DESYREL) 50 MG tablet Take 50 mg by mouth at bedtime.      No current facility-administered medications for this visit.     PHYSICAL EXAMINATION: ECOG PERFORMANCE STATUS: 1 - Symptomatic but completely ambulatory  Vitals:   07/17/17 1349  BP: (!) 152/78  Pulse: 83  Resp: 18  Temp: 98.1 F (36.7 C)  SpO2: 97%   Filed Weights   07/17/17 1349  Weight: 212 lb 14.4 oz (96.6 kg)    GENERAL:alert, no distress and comfortable SKIN: skin color, texture, turgor are normal, no rashes or significant lesions EYES: normal, Conjunctiva are pink and non-injected, sclera clear OROPHARYNX:no exudate, no erythema and lips, buccal mucosa, and tongue normal  NECK: supple, thyroid normal size, non-tender, without  nodularity LYMPH:  no palpable lymphadenopathy in the cervical, axillary or inguinal LUNGS: Normal breathing effort.  Right lung crackles noted.   HEART: regular rate & rhythm and no murmurs and no lower extremity edema ABDOMEN:abdomen soft, non-tender and normal bowel sounds Musculoskeletal:no cyanosis of digits and no clubbing  NEURO: alert & oriented x 3 with fluent speech, no focal motor/sensory deficits  LABORATORY DATA:  I have reviewed the data as listed    Component Value Date/Time   NA 139 07/17/2017 1317   NA 141 07/09/2017 1312   K 3.9 07/17/2017 1317   K 3.9 07/09/2017 1312   CL 101 07/17/2017 1317   CO2 26 07/17/2017 1317   CO2 27 07/09/2017 1312   GLUCOSE 113 07/17/2017 1317   GLUCOSE 109 07/09/2017 1312   BUN 26 07/17/2017 1317   BUN  16.4 07/09/2017 1312   CREATININE 0.81 07/17/2017 1317   CREATININE 0.8 07/09/2017 1312   CALCIUM 9.8 07/17/2017 1317   CALCIUM 9.2 07/09/2017 1312   PROT 7.1 07/17/2017 1317   PROT 7.1 07/09/2017 1312   ALBUMIN 3.3 (L) 07/17/2017 1317   ALBUMIN 3.1 (L) 07/09/2017 1312   AST 21 07/17/2017 1317   AST 35 (H) 07/09/2017 1312   ALT 28 07/17/2017 1317   ALT 43 07/09/2017 1312   ALKPHOS 142 07/17/2017 1317   ALKPHOS 182 (H) 07/09/2017 1312   BILITOT 0.4 07/17/2017 1317   BILITOT 0.34 07/09/2017 1312   GFRNONAA >60 07/17/2017 1317   GFRAA >60 07/17/2017 1317    No results found for: SPEP, UPEP  Lab Results  Component Value Date   WBC 11.5 (H) 07/17/2017   NEUTROABS 10.3 (H) 07/17/2017   HGB 11.2 (L) 07/17/2017   HCT 34.0 (L) 07/17/2017   MCV 78.9 (L) 07/17/2017   PLT 129 (L) 07/17/2017      Chemistry      Component Value Date/Time   NA 139 07/17/2017 1317   NA 141 07/09/2017 1312   K 3.9 07/17/2017 1317   K 3.9 07/09/2017 1312   CL 101 07/17/2017 1317   CO2 26 07/17/2017 1317   CO2 27 07/09/2017 1312   BUN 26 07/17/2017 1317   BUN 16.4 07/09/2017 1312   CREATININE 0.81 07/17/2017 1317   CREATININE 0.8 07/09/2017  1312      Component Value Date/Time   CALCIUM 9.8 07/17/2017 1317   CALCIUM 9.2 07/09/2017 1312   ALKPHOS 142 07/17/2017 1317   ALKPHOS 182 (H) 07/09/2017 1312   AST 21 07/17/2017 1317   AST 35 (H) 07/09/2017 1312   ALT 28 07/17/2017 1317   ALT 43 07/09/2017 1312   BILITOT 0.4 07/17/2017 1317   BILITOT 0.34 07/09/2017 1312     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[place]) So far, she tolerated treatment well except for mild hot flashes Her abdominal pain is resolved She has no mucositis or nausea She had recent lower respiratory tract infection and has been prescribed doxycycline, prednisone and cough medicine She is overall improving Peripheral neuropathy is stable We will continue same dose prescription and I plan to see her back again next month with repeat history, physical examination, blood work and imaging study  Pancytopenia, acquired ([place]) She has mild persistent pancytopenia from treatment. She is not symptomatic. Recommend close observation only.  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 CT ABDOMEN PELVIS W CONTRAST    Standing Status:   Future    Standing Expiration Date:   07/17/2018    Order Specific Question:   If indicated for the ordered procedure, I authorize the administration of contrast media per Radiology protocol    Answer:   Yes    Order Specific Question:   Preferred imaging location?    Answer:   [facility]    Order Specific Question:   Radiology Contrast Protocol - do NOT remove file path    Answer:    [email or web address]  All questions were answered. The patient knows to call the clinic with any problems, questions or concerns. No barriers to learning was detected. I spent 20 minutes counseling the patient face to face. The total time spent in the appointment was 25 minutes and more than 50% was on counseling and review of test results     Heath Lark, MD 07/18/2017 10:52 AM

## 2017-07-18 NOTE — Assessment & Plan Note (Signed)
She has mild crackles on exam on the right lung base She will continue prescription antibiotic She does not need to hold her chemotherapy for now

## 2017-07-18 NOTE — Assessment & Plan Note (Signed)
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 She is not symptomatic. Recommend close observation only.

## 2017-07-18 NOTE — Assessment & Plan Note (Signed)
Pain control has improved Continue observation and pain medicine as needed We will repeat imaging study as above

## 2017-07-18 NOTE — Assessment & Plan Note (Signed)
Neuropathy has been stable and not worse Continue gabapentin as prescribed

## 2017-07-21 ENCOUNTER — Telehealth: Payer: Self-pay | Admitting: Hematology and Oncology

## 2017-07-21 NOTE — Telephone Encounter (Signed)
Scheduled appt per 1/10 los- Sent reminder letter in the mail with appt date and time - [facility] to contact patient with ct schedule.

## 2017-07-28 ENCOUNTER — Telehealth: Payer: Self-pay | Admitting: Medical Oncology

## 2017-07-28 ENCOUNTER — Other Ambulatory Visit: Payer: Self-pay | Admitting: Medical Oncology

## 2017-07-28 DIAGNOSIS — C541 Malignant neoplasm of endometrium: Secondary | ICD-10-CM

## 2017-07-28 MED ORDER — HYDROMORPHONE HCL 4 MG PO TABS: 4.0000 mg | ORAL_TABLET | ORAL | 0 refills | Status: DC | PRN

## 2017-07-28 MED ORDER — PROCHLORPERAZINE MALEATE 10 MG PO TABS: 10.0000 mg | ORAL_TABLET | Freq: Four times a day (QID) | ORAL | 0 refills | Status: DC | PRN

## 2017-07-28 NOTE — Telephone Encounter (Signed)
I am surprised it took her only 2 weeks to call about this pain.  1) Why is she not taking more than 1 hydromorphone? 2) Does she want to be seen before next month? 3) Does she has zofran to take for nausea?

## 2017-07-28 NOTE — Telephone Encounter (Signed)
Pt states she is actually using zofran most of the time for nausea. No refill needed for compazine. Hydromorphone 1 tablet relieves pain. Is drinking plenty of fluids , just has stomach pain after eating. Does not need to be seen at this point.   Dr Alvy Bimler wants her to take Prilosec OTC every morning and use tums for pain relief. Pt verbalized understanding.

## 2017-07-28 NOTE — Telephone Encounter (Signed)
For the last 2 weeks -stomach pain , nausea not related to eating. She left church yesterday due to nausea. Normal BM ( takes stool softener every day). Taking compazine for nausea and just started taking her hydromorphone for pain 1 tablet day . Requests refill for both .

## 2017-08-11 ENCOUNTER — Telehealth: Payer: Self-pay

## 2017-08-11 ENCOUNTER — Telehealth: Payer: Self-pay | Admitting: Oncology

## 2017-08-11 NOTE — Telephone Encounter (Signed)
Called back with below message. Verbalized understanding. Scheduling message sent for tomorrow at 1230.

## 2017-08-11 NOTE — Telephone Encounter (Signed)
Patient called and left message to call her.   Called back. She is complaining of tiredness and weakness since starting her new medication the Afinitor and Letrozole. She is having no appetite, she is making her self eat. Complains of nausea at times after eating. She takes her Zofran for this. Instructed to take compazine in between to see if that helps. Abdominal pain is better after starting Dilaudid.   Instructed to call if needed and I would let Dr. Alvy Bimler know above.

## 2017-08-11 NOTE — Telephone Encounter (Signed)
PLease set up repeat labs tomorrow or day after HOLD Affinitor now but continue on Letrozole Will call her with further instructions once we have labs available

## 2017-08-11 NOTE — Telephone Encounter (Signed)
Called in refill of lorazepam to [facility] per Dr. Sondra Come.

## 2017-08-12 ENCOUNTER — Telehealth: Payer: Self-pay

## 2017-08-12 ENCOUNTER — Inpatient Hospital Stay: Payer: Medicare Other | Attending: Hematology and Oncology

## 2017-08-12 DIAGNOSIS — D6481 Anemia due to antineoplastic chemotherapy: Secondary | ICD-10-CM | POA: Insufficient documentation

## 2017-08-12 DIAGNOSIS — C541 Malignant neoplasm of endometrium: Secondary | ICD-10-CM | POA: Diagnosis present

## 2017-08-12 DIAGNOSIS — R110 Nausea: Secondary | ICD-10-CM | POA: Diagnosis not present

## 2017-08-12 DIAGNOSIS — C787 Secondary malignant neoplasm of liver and intrahepatic bile duct: Secondary | ICD-10-CM | POA: Insufficient documentation

## 2017-08-12 DIAGNOSIS — Z7189 Other specified counseling: Secondary | ICD-10-CM | POA: Insufficient documentation

## 2017-08-12 LAB — COMPREHENSIVE METABOLIC PANEL
ALBUMIN: 2.9 g/dL — AB (ref 3.5–5.0)
ALK PHOS: 125 U/L (ref 40–150)
ALT: 48 U/L (ref 0–55)
ANION GAP: 9 (ref 3–11)
AST: 29 U/L (ref 5–34)
BUN: 20 mg/dL (ref 7–26)
CALCIUM: 9 mg/dL (ref 8.4–10.4)
CO2: 25 mmol/L (ref 22–29)
Chloride: 103 mmol/L (ref 98–109)
Creatinine, Ser: 0.87 mg/dL (ref 0.60–1.10)
GFR calc non Af Amer: >60 mL/min (ref >60)
GLUCOSE: 179 mg/dL — AB (ref 70–140)
Potassium: 4.2 mmol/L (ref 3.5–5.1)
SODIUM: 137 mmol/L (ref 136–145)
Total Bilirubin: 0.3 mg/dL (ref 0.2–1.2)
Total Protein: 6.5 g/dL (ref 6.4–8.3)

## 2017-08-12 LAB — CBC WITH DIFFERENTIAL/PLATELET
BASOS PCT: 0 %
Basophils Absolute: 0 10*3/uL (ref 0.0–0.1)
Eosinophils Absolute: 0.2 10*3/uL (ref 0.0–0.5)
Eosinophils Relative: 3 %
HEMATOCRIT: 28.9 % — AB (ref 34.8–46.6)
HEMOGLOBIN: 9.3 g/dL — AB (ref 11.6–15.9)
LYMPHS ABS: 1 10*3/uL (ref 0.9–3.3)
Lymphocytes Relative: 18 %
MCH: 24.7 pg — AB (ref 25.1–34.0)
MCHC: 32.2 g/dL (ref 31.5–36.0)
MCV: 76.7 fL — AB (ref 79.5–101.0)
MONOS PCT: 6 %
Monocytes Absolute: 0.4 10*3/uL (ref 0.1–0.9)
NEUTROS ABS: 3.9 10*3/uL (ref 1.5–6.5)
NEUTROS PCT: 73 %
Platelets: 153 10*3/uL (ref 145–400)
RBC: 3.77 MIL/uL (ref 3.70–5.45)
RDW: 14.1 % (ref 11.2–14.5)
WBC: 5.5 10*3/uL (ref 3.9–10.3)

## 2017-08-12 NOTE — Telephone Encounter (Signed)
Spoke with pt by phone and explained she needs to stop the Affinitor d/t HGB of 9.3 until she sees Dr Alvy Bimler on 2/21.  Pt to continue Femara.  Pt verbalizes understanding of instructions.

## 2017-08-12 NOTE — Telephone Encounter (Signed)
LVM for pt to return call

## 2017-08-26 ENCOUNTER — Encounter (HOSPITAL_COMMUNITY): Payer: Self-pay

## 2017-08-26 ENCOUNTER — Ambulatory Visit (HOSPITAL_COMMUNITY)
Admission: RE | Admit: 2017-08-26 | Discharge: 2017-08-26 | Disposition: A | Discharge status: Home / Self Care | Payer: Medicare Other | Source: Ambulatory Visit | Attending: Hematology and Oncology | Admitting: Hematology and Oncology

## 2017-08-26 ENCOUNTER — Other Ambulatory Visit: Payer: Self-pay | Admitting: Hematology and Oncology

## 2017-08-26 ENCOUNTER — Inpatient Hospital Stay: Payer: Medicare Other

## 2017-08-26 DIAGNOSIS — R1909 Other intra-abdominal and pelvic swelling, mass and lump: Secondary | ICD-10-CM | POA: Insufficient documentation

## 2017-08-26 DIAGNOSIS — C541 Malignant neoplasm of endometrium: Secondary | ICD-10-CM

## 2017-08-26 DIAGNOSIS — C787 Secondary malignant neoplasm of liver and intrahepatic bile duct: Secondary | ICD-10-CM | POA: Insufficient documentation

## 2017-08-26 LAB — CBC WITH DIFFERENTIAL/PLATELET
BASOS ABS: 0 10*3/uL (ref 0.0–0.1)
BASOS PCT: 0 %
EOS PCT: 3 %
Eosinophils Absolute: 0.3 10*3/uL (ref 0.0–0.5)
HCT: 29.3 % — ABNORMAL LOW (ref 34.8–46.6)
Hemoglobin: 9.3 g/dL — ABNORMAL LOW (ref 11.6–15.9)
Lymphocytes Relative: 12 %
Lymphs Abs: 1.1 10*3/uL (ref 0.9–3.3)
MCH: 24.9 pg — ABNORMAL LOW (ref 25.1–34.0)
MCHC: 31.7 g/dL (ref 31.5–36.0)
MCV: 78.3 fL — ABNORMAL LOW (ref 79.5–101.0)
Monocytes Absolute: 0.7 10*3/uL (ref 0.1–0.9)
Monocytes Relative: 7 %
Neutro Abs: 7.2 10*3/uL — ABNORMAL HIGH (ref 1.5–6.5)
Neutrophils Relative %: 78 %
PLATELETS: 289 10*3/uL (ref 145–400)
RBC: 3.74 MIL/uL (ref 3.70–5.45)
RDW: 16.7 % — ABNORMAL HIGH (ref 11.2–14.5)
WBC: 9.3 10*3/uL (ref 3.9–10.3)

## 2017-08-26 LAB — COMPREHENSIVE METABOLIC PANEL
ALBUMIN: 3 g/dL — AB (ref 3.5–5.0)
ALT: 21 U/L (ref 0–55)
AST: 22 U/L (ref 5–34)
Alkaline Phosphatase: 121 U/L (ref 40–150)
Anion gap: 10 (ref 3–11)
BUN: 14 mg/dL (ref 7–26)
CHLORIDE: 103 mmol/L (ref 98–109)
CO2: 29 mmol/L (ref 22–29)
Calcium: 9.9 mg/dL (ref 8.4–10.4)
Creatinine, Ser: 0.81 mg/dL (ref 0.60–1.10)
GFR calc Af Amer: >60 mL/min (ref >60)
GFR calc non Af Amer: >60 mL/min (ref >60)
Glucose, Bld: 94 mg/dL (ref 70–140)
POTASSIUM: 4 mmol/L (ref 3.5–5.1)
Sodium: 142 mmol/L (ref 136–145)
Total Bilirubin: 0.4 mg/dL (ref 0.2–1.2)
Total Protein: 6.6 g/dL (ref 6.4–8.3)

## 2017-08-26 MED ORDER — IOPAMIDOL (ISOVUE-300) INJECTION 61%
INTRAVENOUS | Status: AC
  Filled 2017-08-26: qty 100

## 2017-08-26 MED ORDER — IOPAMIDOL (ISOVUE-300) INJECTION 61%
100.0000 mL | Freq: Once | INTRAVENOUS | Status: AC | PRN
  Administered 2017-08-26: 100 mL via INTRAVENOUS

## 2017-08-28 ENCOUNTER — Telehealth: Payer: Self-pay | Admitting: Hematology and Oncology

## 2017-08-28 ENCOUNTER — Inpatient Hospital Stay (HOSPITAL_BASED_OUTPATIENT_CLINIC_OR_DEPARTMENT_OTHER): Payer: Medicare Other | Admitting: Hematology and Oncology

## 2017-08-28 VITALS — BP 128/75 | HR 90 | Temp 98.4°F | Resp 18 | Ht 65.5 in | Wt 210.4 lb

## 2017-08-28 DIAGNOSIS — C541 Malignant neoplasm of endometrium: Secondary | ICD-10-CM | POA: Diagnosis not present

## 2017-08-28 DIAGNOSIS — D6481 Anemia due to antineoplastic chemotherapy: Secondary | ICD-10-CM | POA: Diagnosis not present

## 2017-08-28 DIAGNOSIS — C787 Secondary malignant neoplasm of liver and intrahepatic bile duct: Secondary | ICD-10-CM | POA: Diagnosis not present

## 2017-08-28 DIAGNOSIS — Z7189 Other specified counseling: Secondary | ICD-10-CM

## 2017-08-28 DIAGNOSIS — R110 Nausea: Secondary | ICD-10-CM

## 2017-08-28 DIAGNOSIS — T451X5A Adverse effect of antineoplastic and immunosuppressive drugs, initial encounter: Secondary | ICD-10-CM

## 2017-08-28 DIAGNOSIS — D539 Nutritional anemia, unspecified: Secondary | ICD-10-CM

## 2017-08-28 MED ORDER — PROCHLORPERAZINE MALEATE 10 MG PO TABS: 10.0000 mg | ORAL_TABLET | Freq: Four times a day (QID) | ORAL | 1 refills | Status: DC | PRN

## 2017-08-28 MED ORDER — LIDOCAINE-PRILOCAINE 2.5-2.5 % EX CREA: TOPICAL_CREAM | CUTANEOUS | 3 refills | Status: DC

## 2017-08-28 MED ORDER — ONDANSETRON HCL 8 MG PO TABS: 8.0000 mg | ORAL_TABLET | Freq: Two times a day (BID) | ORAL | 1 refills | Status: DC | PRN

## 2017-08-28 NOTE — Patient Instructions (Signed)
Doxorubicin Liposomal injection What is this medicine? LIPOSOMAL DOXORUBICIN (LIP oh som al dox oh ROO bi sin) is a chemotherapy drug. This medicine is used to treat many kinds of cancer like Kaposi's sarcoma, multiple myeloma, and ovarian cancer. This medicine may be used for other purposes; ask your health care provider or pharmacist if you have questions. COMMON BRAND NAME(S): Doxil, Lipodox What should I tell my health care provider before I take this medicine? They need to know if you have any of these conditions: -blood disorders -heart disease -infection (especially a virus infection such as chickenpox, cold sores, or herpes) -liver disease -recent or ongoing radiation therapy -an unusual or allergic reaction to doxorubicin, other chemotherapy agents, soybeans, other medicines, foods, dyes, or preservatives -pregnant or trying to get pregnant -breast-feeding How should I use this medicine? This drug is given as an infusion into a vein. It is administered in a hospital or clinic by a specially trained health care professional. If you have pain, swelling, burning or any unusual feeling around the site of your injection, tell your health care professional right away. Talk to your pediatrician regarding the use of this medicine in children. Special care may be needed. Overdosage: If you think you have taken too much of this medicine contact a poison control center or emergency room at once. NOTE: This medicine is only for you. Do not share this medicine with others. What if I miss a dose? It is important not to miss your dose. Call your doctor or health care professional if you are unable to keep an appointment. What may interact with this medicine? Do not take this medicine with any of the following medications: -zidovudine This medicine may also interact with the following medications: -medicines to increase blood counts like filgrastim, pegfilgrastim, sargramostim -vaccines Talk to  your doctor or health care professional before taking any of these medicines: -acetaminophen -aspirin -ibuprofen -ketoprofen -naproxen This list may not describe all possible interactions. Give your health care provider a list of all the medicines, herbs, non-prescription drugs, or dietary supplements you use. Also tell them if you smoke, drink alcohol, or use illegal drugs. Some items may interact with your medicine. What should I watch for while using this medicine? Your condition will be monitored carefully while you are receiving this medicine. You will need important blood work done while you are taking this medicine. This drug may make you feel generally unwell. This is not uncommon, as chemotherapy can affect healthy cells as well as cancer cells. Report any side effects. Continue your course of treatment even though you feel ill unless your doctor tells you to stop. Your urine may turn orange-red for a few days after your dose. This is not blood. If your urine is dark or brown, call your doctor. In some cases, you may be given additional medicines to help with side effects. Follow all directions for their use. Call your doctor or health care professional for advice if you get a fever (100.5 degrees F or higher), chills or sore throat, or other symptoms of a cold or flu. Do not treat yourself. This drug decreases your body's ability to fight infections. Try to avoid being around people who are sick. This medicine may increase your risk to bruise or bleed. Call your doctor or health care professional if you notice any unusual bleeding. Be careful brushing and flossing your teeth or using a toothpick because you may get an infection or bleed more easily. If you have any dental  work done, tell your dentist you are receiving this medicine. Avoid taking products that contain aspirin, acetaminophen, ibuprofen, naproxen, or ketoprofen unless instructed by your doctor. These medicines may hide a  fever. Men and women of childbearing age should use effective birth control methods while using taking this medicine. Do not become pregnant while taking this medicine. There is a potential for serious side effects to an unborn child. Talk to your health care professional or pharmacist for more information. Do not breast-feed an infant while taking this medicine. Talk to your doctor about your risk of cancer. You may be more at risk for certain types of cancers if you take this medicine. What side effects may I notice from receiving this medicine? Side effects that you should report to your doctor or health care professional as soon as possible: -allergic reactions like skin rash, itching or hives, swelling of the face, lips, or tongue -low blood counts - this medicine may decrease the number of white blood cells, red blood cells and platelets. You may be at increased risk for infections and bleeding. -signs of hand-foot syndrome - tingling or burning, redness, flaking, swelling, small blisters, or small sores on the palms of your hands or the soles of your feet -signs of infection - fever or chills, cough, sore throat, pain or difficulty passing urine -signs of decreased platelets or bleeding - bruising, pinpoint red spots on the skin, black, tarry stools, blood in the urine -signs of decreased red blood cells - unusually weak or tired, fainting spells, lightheadedness -back pain, chills, facial flushing, fever, headache, tightness in the chest or throat during the infusion -breathing problems -chest pain -fast, irregular heartbeat -mouth pain, redness, sores -pain, swelling, redness at site where injected -pain, tingling, numbness in the hands or feet -swelling of ankles, feet, or hands -vomiting Side effects that usually do not require medical attention (report to your doctor or health care professional if they continue or are bothersome): -diarrhea -hair loss -loss of appetite -nail  discoloration or damage -nausea -red or watery eyes -red colored urine -stomach upset This list may not describe all possible side effects. Call your doctor for medical advice about side effects. You may report side effects to FDA at 1-800-FDA-1088. Where should I keep my medicine? This drug is given in a hospital or clinic and will not be stored at home. NOTE: This sheet is a summary. It may not cover all possible information. If you have questions about this medicine, talk to your doctor, pharmacist, or health care provider.  2018 [employer]vier/Gold Standard (2012-03-13 10:12:56)

## 2017-08-28 NOTE — Progress Notes (Addendum)
ALERT: A disease instance has been permanently removed from this patient's pathway record and replaced with a new disease instance. Information on the new disease instance will be transmitted in a separate message.  Disease Being Removed: [Other Dx]  Reason for Removal: Reason not listed 

## 2017-08-28 NOTE — Telephone Encounter (Signed)
Gave avs and calendar for march °

## 2017-08-28 NOTE — Progress Notes (Signed)
START OFF PATHWAY REGIMEN - Uterine   [id number]:Liposomal Doxorubicin (Doxil) 40 mg/m2 q28 Days:   A cycle is every 28 days:     Liposomal doxorubicin   **Always confirm dose/schedule in your pharmacy ordering system**    Patient Characteristics: Endometrioid Histology, Recurrent/Progressive Disease, Third Line and Beyond, MSS/pMMR Histology: Endometrioid Histology Therapeutic Status: Recurrent or Progressive Disease AJCC T Category: T1 AJCC N Category: N0 AJCC M Category: M1 AJCC 8 Stage Grouping: IVB Would you be surprised if this patient died  in the next year<= I would be surprised if this patient died in the next year Line of Therapy: Third Engineer, civil (consulting) Status: MSS/pMMR Intent of Therapy: Non-Curative / Palliative Intent, Discussed with Patient

## 2017-08-29 ENCOUNTER — Encounter: Payer: Self-pay | Admitting: Hematology and Oncology

## 2017-08-29 DIAGNOSIS — C787 Secondary malignant neoplasm of liver and intrahepatic bile duct: Secondary | ICD-10-CM | POA: Insufficient documentation

## 2017-08-29 DIAGNOSIS — R110 Nausea: Secondary | ICD-10-CM | POA: Insufficient documentation

## 2017-08-29 NOTE — Assessment & Plan Note (Addendum)
She does not need blood transfusion at this point I recommend discontinuation of chemotherapy and wait at least 2 weeks before we proceed with new treatment I plan to recheck serum vitamin B12 and iron studies to exclude nutritional deficiency as a cause of her anemia.

## 2017-08-29 NOTE — Assessment & Plan Note (Addendum)
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, noncurable Surgical resection is no longer an option We discussed standard of care versus clinical trial They did not understand the concept of clinical trial With my best ability, I explained to them what clinical trial means There are clinical trials available at [facility] and at other institutions If the patient is interested, I will be happy to refer her out to consider clinical trial I reviewed the current guidelines with them We discussed chemotherapy options The patient had disease relapse and under 6 months after completing carboplatin and Taxol I would like to try something different Given her history of recurrent infection and tolerance to treatment, I recommend single agent Doxil I recommend pretreatment echocardiogram and port placement We discussed the risk, benefits, side effects of Doxil including risk of pancytopenia, nausea, infection, cardiomyopathy, etc. and she agreed to proceed Estimated response rate in the region of 25% or so I plan to see her back again in 2 weeks for final discussion before she starts her treatment

## 2017-08-29 NOTE — Assessment & Plan Note (Signed)
She has chronic nausea from recent chemotherapy Hopefully, after we stop her oral chemotherapy, the nausea will resolve

## 2017-08-29 NOTE — Progress Notes (Signed)
[place] OFFICE PROGRESS NOTE  Patient Care Team: [name], MD as PCP - General (Family Medicine)  ASSESSMENT & PLAN:  Recurrent carcinoma of endometrium [facility]) 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, noncurable Surgical resection is no longer an option We discussed standard of care versus clinical trial They did not understand the concept of clinical trial With my best ability, I explained to them what clinical trial means There are clinical trials available at [facility] and at other institutions If the patient is interested, I will be happy to refer her out to consider clinical trial I reviewed the current guidelines with them We discussed chemotherapy options The patient had disease relapse and under 6 months after completing carboplatin and Taxol I would like to try something different Given her history of recurrent infection and tolerance to treatment, I recommend single agent Doxil I recommend pretreatment echocardiogram and port placement We discussed the risk, benefits, side effects of Doxil including risk of pancytopenia, nausea, infection, cardiomyopathy, etc. and she agreed to proceed Estimated response rate in the region of 25% or so I plan to see her back again in 2 weeks for final discussion before she starts her treatment  Anemia due to antineoplastic chemotherapy She does not need blood transfusion at this point I recommend discontinuation of chemotherapy and wait at least 2 weeks before we proceed with new treatment I plan to recheck serum vitamin B12 and iron studies to exclude nutritional deficiency as a cause of her  anemia.  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) I counted 3 liver lesions The patient is not symptomatic Liver function is stable For this reason, she would not need dose adjustment for chemotherapy  Goals of care, counseling/discussion I had a long discussion with the patient and her family member regarding the goals of care They are very upset and wanted a treatment that would give her at least 70-80% chance of response I told her this is unrealistic Even with combination chemotherapy, expected best response rate would be in the region of 40-50% and not higher than that With combination chemotherapy, it is unlikely that the patient can tolerate it due to her frail status and poor tolerance to treatment in the past Given her situation with progressive disease and moving to third line treatment, I estimated her future prognosis is poor We discussed that treatment is strictly palliative in nature We discussed briefly about CODE STATUS and what is most important to the patient at this point should be focused on quality of life I plan to revisit with her again in 2 weeks when I see her back prior to the start date of treatment.   Orders Placed This Encounter  Procedures  . IR FLUORO GUIDE PORT INSERTION RIGHT    Standing Status:   Future    Standing Expiration Date:   10/27/2018    Order Specific Question:   Reason for Exam (SYMPTOM  OR DIAGNOSIS REQUIRED)    Answer:   need chemo to start first week March    Order Specific Question:   Preferred Imaging Location?  Answer:   [facility]  . CBC with Differential ([place] Only)    Standing Status:   Standing    Number of Occurrences:   20    Standing Expiration Date:   08/28/2018  . CMP ([place] only)    Standing Status:   Standing    Number of Occurrences:   20    Standing Expiration Date:    08/28/2018  . Vitamin B12    Standing Status:   Future    Standing Expiration Date:   10/03/2018  . Iron and TIBC    Standing Status:   Future    Standing Expiration Date:   10/03/2018  . Ferritin    Standing Status:   Future    Standing Expiration Date:   10/03/2018    INTERVAL HISTORY: Please see below for problem oriented charting. She returns to review test results She has chronic nausea and poor appetite She denies abdominal pain, bloating, changes in bowel habits or vaginal bleeding She denies recent mouth sores, infection, fever or chills.  SUMMARY OF ONCOLOGIC HISTORY: Oncology History   The patient is a 72 year old G1P1 who was initially seen in consultation at the request of Dr Pamala Hurry for grade 2 endometrial cancer. A transvaginal ultrasound scan was performed on 09/11/2015. It revealed a uterus measuring 7.2 x 6 x 4.5 cm with a thickened endometrial stripe of 29 mm. The ovaries were normal bilaterally. 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  MSI stable  05/15/16: ER is moderately positive (70%). PR is strongly positive (80%).     Recurrent carcinoma of endometrium ([place])   01/02/2016 Pathology Results    Uterus +/- tubes/ovaries, neoplastic, cervix ENDOMETRIAL ADENOCARCINOMA, FIGO GRADE 2 (4.7 CM) THE TUMOR INVADES LESS THAN ONE-HALF OF THE MYOMETRIUM (PT1A) ALL MARGINS OF RESECTION ARE NEGATIVE FOR CARCINOMA LEIOMYOMAS AND ADENOMYOSIS BILATERAL FALLOPIAN TUBES AND OVARIES: HISTOLOGICAL UNREMARKABLE 2. Lymph node, sentinel, biopsy, right obturator ONE BENIGN LYMPH NODE (0/1) 3. Lymph nodes, regional resection, left pelvic FOUR BENIGN LYMPH NODES (0/4)      01/02/2016 Surgery    Dr. Denman George performed robotic-assisted laparoscopic total hysterectomy with bilateral salpingoophorectomy, sentinel lymph node biopsy, lymphadenectomy        05/15/2016 Pathology Results    Vagina, biopsy,  mid - ADENOCARCINOMA, SEE COMMENT. Microscopic Comment The morphology along with the patient's history are consistent with recurrent endometrioid adenocarcinoma. The carcinoma has a similar appearance to the primary ([id number]).      05/20/2016 Imaging    Ct scan abdomen showed solid 2.5 cm peritoneal mass in the mid to anterior left pelvis, suspicious for peritoneal metastasis. 2. Small expansile low-attenuation filling defect in the left external iliac vein, cannot exclude a small deep venous thrombus. Consider correlation with left lower extremity venous Doppler scan. 3. Small simple fluid density structure in the left pelvic sidewall abutting the left external iliac vessels, favor a small postoperative seroma. 4. No ascites. 5. No lymphadenopathy.  No metastatic disease in the chest. 6. Aortic atherosclerosis.      06/12/2016 PET scan    Intensely hypermetabolic 2.1 cm central pelvic peritoneal mass just to the left of midline, consistent with peritoneal metastatic recurrence. No ascites. 2. No additional hypermetabolic sites of metastatic disease. 3. Diffuse thyroid hypermetabolism without discrete thyroid nodule, favoring thyroiditis. Recommend correlation with serum thyroid function tests.      06/24/2016 - 07/22/2016 Chemotherapy    The patient had weekly  cisplatin. She has missed several doses due to infection and pancytopenia      06/24/2016 - 09/04/2016 Radiation Therapy    She completed concurrent radiation therapy Radiation treatment dates:   IMRT : 06/24/16 - 08/01/16 HDR : 08/13/16, 08/20/16, 08/27/16, 09/04/16  Site/dose:   Pelvis treated to 55 Gy in 25 fractions (simultaneous integrated boost technique) Vaginal Cuff treated to 24 Gy in 4 fractions      08/15/2016 - 12/03/2016 Chemotherapy    She received 6 cycles of carboplatin/Taxol      09/27/2016 Imaging    Interval decrease in size of previously described solid peritoneal nodule within the left anterior pelvis. Near  complete resolution of previously described low-attenuation structure along the left pelvic sidewall. No evidence for metastatic disease in the chest. Aortic atherosclerosis.      12/30/2016 Imaging    Ct abdomen 1. Solitary left pelvic peritoneal implant is mildly decreased in size in the interval. 2. No new or progressive metastatic disease in the abdomen or pelvis. No ascites. 3. Aortic atherosclerosis.      04/02/2017 Imaging    Left lower quadrant peritoneal implant referenced on previous exam measures 1.7 x 1.3 cm, image 69 of series 2. Increased from 0.8 x 0.8 cm previously peer no new peritoneal implants identified.  Musculoskeletal: The degenerative disc disease noted within the lumbar spine.  IMPRESSION: 1. Solitary left pelvic peritoneal implant is increased in size in the interval. 2. No new sites of disease.  No ascites. 3. Aortic atherosclerosis      04/11/2017 PET scan    1. The left side of pelvis peritoneal implant has decreased in size and degree of FDG uptake compatible with response to therapy. No new areas of peritoneal disease identified. 2. Persistent diffuse increased uptake within the thyroid gland. Correlation with patient's thyroid function may be helpful.      05/26/2017 Imaging    1. Enlarging tumor implant along the left adnexa, currently 2.6 by 2.4 cm and previously 1.9 by 1.3 cm. No new tumor implant or other specific cause for the patient's pelvic symptoms is currently identified. 2.  Aortic Atherosclerosis (ICD10-I70.0). 3. Lumbar spondylosis and degenerative disc disease causing multilevel impingement.      06/04/2017 -  Chemotherapy    The patient started letrozole and everolimus      08/26/2017 Imaging    Increased size of mass in the left adnexal region.  Several new small liver metastases in right hepatic lobe       REVIEW OF SYSTEMS:   Constitutional: Denies fevers, chills or abnormal weight loss Eyes: Denies blurriness of  vision Ears, nose, mouth, throat, and face: Denies mucositis or sore throat Respiratory: Denies cough, dyspnea or wheezes Cardiovascular: Denies palpitation, chest discomfort or lower extremity swelling Skin: Denies abnormal skin rashes Lymphatics: Denies new lymphadenopathy or easy bruising Neurological:Denies numbness, tingling or new weaknesses Behavioral/Psych: Mood is stable, no new changes  All other systems were reviewed with the patient and are negative.  I have reviewed the past medical history, past surgical history, social history and family history with the patient and they are unchanged from previous note.  ALLERGIES:  is allergic to latex.  MEDICATIONS:  Current Outpatient Medications  Medication Sig Dispense Refill  . ALPRAZolam (XANAX) 1 MG tablet Take one tablet at bedtime as needed for insomnia and restless legs.    Marland Kitchen amoxicillin (AMOXIL) 500 MG capsule Take 2,000 mg by mouth See admin instructions. Prior to dental appointment, takes 4 tables prior  to appt    . Biotin w/ Vitamins C & E (HAIR/SKIN/NAILS PO) Take 1 tablet by mouth daily.    . Calcium Carb-Cholecalciferol (CALCIUM 600 + D PO) Take 2 tablets by mouth daily.    . chlorhexidine (PERIDEX) 0.12 % solution Use as directed 15 mLs in the mouth or throat 2 (two) times daily. (Patient not taking: Reported on 07/07/2017) 120 mL 0  . clonazePAM (KLONOPIN) 0.5 MG tablet Take 1 tablet (0.5 mg total) by mouth daily as needed (restless legs). (Patient taking differently: Take 0.5 mg by mouth at bedtime as needed (restless legs). ) 60 tablet 1  . dexamethasone (DECADRON) 0.5 MG/5ML solution Swish 75m in mouth for 249m and spit out. Use 4 times daily for 8 weeks. Start with Afinitor. Avoid eating/drinking for 1hr after rinse. 500 mL 2  . everolimus (AFINITOR) 10 MG tablet Take 1 tablet (10 mg total) by mouth daily. 30 tablet 11  . fluticasone (FLONASE) 50 MCG/ACT nasal spray Place 1 spray into both nostrils 2 (two) times daily  as needed for allergies.   1  . gabapentin (NEURONTIN) 600 MG tablet TAKE 1 TABLET BY MOUTH TWICE A DAY (Patient taking differently: Take 1 tablet 2 times a day) 60 tablet 9  . HYDROmorphone (DILAUDID) 4 MG tablet Take 1 tablet (4 mg total) by mouth every 4 (four) hours as needed for severe pain. 60 tablet 0  . ibuprofen (ADVIL,MOTRIN) 200 MG tablet Take 400 mg by mouth every 8 (eight) hours as needed for mild pain.    . Marland Kitchenetrozole (FEMARA) 2.5 MG tablet Take 1 tablet (2.5 mg total) by mouth daily. 90 tablet 1  . levothyroxine (SYNTHROID, LEVOTHROID) 88 MCG tablet Take 88 mcg by mouth daily.  3  . lidocaine-prilocaine (EMLA) cream Apply to affected area once 30 g 3  . loratadine (CLARITIN) 10 MG tablet Take 10 mg by mouth daily.    . Marland KitchenORazepam (ATIVAN) 1 MG tablet Take 1 tablet (1 mg total) by mouth at bedtime as needed for sleep. 30 tablet 0  . losartan-hydrochlorothiazide (HYZAAR) 100-12.5 MG tablet Take 1/2 tablet daily for hypertension.    . Magnesium 250 MG TABS Take 250 mg by mouth daily.    . Melatonin 10 MG CAPS Take 10 mg by mouth at bedtime.    . ondansetron (ZOFRAN) 8 MG tablet Take 1 tablet (8 mg total) by mouth every 8 (eight) hours as needed for nausea. 60 tablet 1  . ondansetron (ZOFRAN) 8 MG tablet Take 1 tablet (8 mg total) by mouth 2 (two) times daily as needed (Nausea or vomiting). 30 tablet 1  . potassium gluconate (HM POTASSIUM) 595 (99 K) MG TABS tablet Take 595 mg by mouth daily.    . prochlorperazine (COMPAZINE) 10 MG tablet Take 1 tablet (10 mg total) by mouth every 6 (six) hours as needed for nausea. 20 tablet 0  . prochlorperazine (COMPAZINE) 10 MG tablet Take 1 tablet (10 mg total) by mouth every 6 (six) hours as needed (Nausea or vomiting). 30 tablet 1  . traZODone (DESYREL) 50 MG tablet Take 50 mg by mouth at bedtime.      No current facility-administered medications for this visit.     PHYSICAL EXAMINATION: ECOG PERFORMANCE STATUS: 1 - Symptomatic but completely  ambulatory  Vitals:   08/28/17 1321  BP: 128/75  Pulse: 90  Resp: 18  Temp: 98.4 F (36.9 C)  SpO2: 97%   Filed Weights   08/28/17 1321  Weight: 210 lb  6.4 oz (95.4 kg)    GENERAL:alert, no distress and comfortable SKIN: skin color, texture, turgor are normal, no rashes or significant lesions EYES: normal, Conjunctiva are pink and non-injected, sclera clear Musculoskeletal:no cyanosis of digits and no clubbing  NEURO: alert & oriented x 3 with fluent speech, no focal motor/sensory deficits  LABORATORY DATA:  I have reviewed the data as listed    Component Value Date/Time   NA 142 08/26/2017 1247   NA 141 07/09/2017 1312   K 4.0 08/26/2017 1247   K 3.9 07/09/2017 1312   CL 103 08/26/2017 1247   CO2 29 08/26/2017 1247   CO2 27 07/09/2017 1312   GLUCOSE 94 08/26/2017 1247   GLUCOSE 109 07/09/2017 1312   BUN 14 08/26/2017 1247   BUN 16.4 07/09/2017 1312   CREATININE 0.81 08/26/2017 1247   CREATININE 0.8 07/09/2017 1312   CALCIUM 9.9 08/26/2017 1247   CALCIUM 9.2 07/09/2017 1312   PROT 6.6 08/26/2017 1247   PROT 7.1 07/09/2017 1312   ALBUMIN 3.0 (L) 08/26/2017 1247   ALBUMIN 3.1 (L) 07/09/2017 1312   AST 22 08/26/2017 1247   AST 35 (H) 07/09/2017 1312   ALT 21 08/26/2017 1247   ALT 43 07/09/2017 1312   ALKPHOS 121 08/26/2017 1247   ALKPHOS 182 (H) 07/09/2017 1312   BILITOT 0.4 08/26/2017 1247   BILITOT 0.34 07/09/2017 1312   GFRNONAA >60 08/26/2017 1247   GFRAA >60 08/26/2017 1247    No results found for: SPEP, UPEP  Lab Results  Component Value Date   WBC 9.3 08/26/2017   NEUTROABS 7.2 (H) 08/26/2017   HGB 9.3 (L) 08/26/2017   HCT 29.3 (L) 08/26/2017   MCV 78.3 (L) 08/26/2017   PLT 289 08/26/2017      Chemistry      Component Value Date/Time   NA 142 08/26/2017 1247   NA 141 07/09/2017 1312   K 4.0 08/26/2017 1247   K 3.9 07/09/2017 1312   CL 103 08/26/2017 1247   CO2 29 08/26/2017 1247   CO2 27 07/09/2017 1312   BUN 14 08/26/2017 1247   BUN  16.4 07/09/2017 1312   CREATININE 0.81 08/26/2017 1247   CREATININE 0.8 07/09/2017 1312      Component Value Date/Time   CALCIUM 9.9 08/26/2017 1247   CALCIUM 9.2 07/09/2017 1312   ALKPHOS 121 08/26/2017 1247   ALKPHOS 182 (H) 07/09/2017 1312   AST 22 08/26/2017 1247   AST 35 (H) 07/09/2017 1312   ALT 21 08/26/2017 1247   ALT 43 07/09/2017 1312   BILITOT 0.4 08/26/2017 1247   BILITOT 0.34 07/09/2017 1312       RADIOGRAPHIC STUDIES: I have reviewed several studies with the patient I have personally reviewed the radiological images as listed and agreed with the findings in the report. Ct Abdomen Pelvis W Contrast  Result Date: 08/26/2017 CLINICAL DATA:  Followup recurrent endometrial carcinoma. Undergoing chemotherapy. Previous hysterectomy and radiation therapy. EXAM: CT ABDOMEN AND PELVIS WITH CONTRAST TECHNIQUE: Multidetector CT imaging of the abdomen and pelvis was performed using the standard protocol following bolus administration of intravenous contrast. CONTRAST:  1102m ISOVUE-300 IOPAMIDOL (ISOVUE-300) INJECTION 61% COMPARISON:  05/26/2017 FINDINGS: Lower Chest: No acute findings. Hepatobiliary: Several small low-attenuation masses are seen in the right hepatic lobe which are new since previous study, consistent with liver metastases. Largest in the right hepatic lobe measures 1.8 x 1.6 cm on image 27/2. Gallbladder is unremarkable. No evidence of biliary ductal dilatation. Pancreas:  No mass  or inflammatory changes. Spleen: Within normal limits in size and appearance. Adrenals/Urinary Tract: No masses identified. No evidence of hydronephrosis. Stomach/Bowel: No evidence of obstruction, inflammatory process or abnormal fluid collections. Vascular/Lymphatic: No pathologically enlarged lymph nodes. No abdominal aortic aneurysm. Reproductive: Prior hysterectomy. Irregular soft tissue mass in the left adnexa has increased in size, currently measuring 3.8 x 3.6 cm on image 67/2, compared to  2.6 x 2.4 cm previously. No new masses are identified. No evidence of ascites. Other:  None. Musculoskeletal:  No suspicious bone lesions identified. IMPRESSION: Increased size of mass in the left adnexal region. Several new small liver metastases in right hepatic lobe. Electronically Signed   By: Earle Gell M.D.   On: 08/26/2017 16:29    All questions were answered. The patient knows to call the clinic with any problems, questions or concerns. No barriers to learning was detected.  I spent 45 minutes counseling the patient face to face. The total time spent in the appointment was 70 minutes and more than 50% was on counseling and review of test results  Heath Lark, MD 08/29/2017 9:10 AM

## 2017-08-29 NOTE — Assessment & Plan Note (Addendum)
I had a long discussion with the patient and her family member regarding the goals of care They are very upset and wanted a treatment that would give her at least 70-80% chance of response I told her this is unrealistic Even with combination chemotherapy, expected best response rate would be in the region of 40-50% and not higher than that With combination chemotherapy, it is unlikely that the patient can tolerate it due to her frail status and poor tolerance to treatment in the past Given her situation with progressive disease and moving to third line treatment, I estimated her future prognosis is poor We discussed that treatment is strictly palliative in nature We discussed briefly about CODE STATUS and what is most important to the patient at this point should be focused on quality of life I plan to revisit with her again in 2 weeks when I see her back prior to the start date of treatment.

## 2017-08-29 NOTE — Assessment & Plan Note (Signed)
I counted 3 liver lesions The patient is not symptomatic Liver function is stable For this reason, she would not need dose adjustment for chemotherapy

## 2017-09-01 ENCOUNTER — Other Ambulatory Visit: Payer: Self-pay | Admitting: Hematology and Oncology

## 2017-09-01 DIAGNOSIS — C541 Malignant neoplasm of endometrium: Secondary | ICD-10-CM

## 2017-09-01 DIAGNOSIS — Z5111 Encounter for antineoplastic chemotherapy: Secondary | ICD-10-CM | POA: Insufficient documentation

## 2017-09-02 ENCOUNTER — Other Ambulatory Visit: Payer: Self-pay | Admitting: Hematology and Oncology

## 2017-09-02 ENCOUNTER — Telehealth: Payer: Self-pay | Admitting: *Deleted

## 2017-09-02 MED ORDER — TRAZODONE HCL 50 MG PO TABS: 75.0000 mg | ORAL_TABLET | Freq: Every day | ORAL | 1 refills | Status: DC

## 2017-09-02 NOTE — Telephone Encounter (Signed)
Patient called to say she has not been able to sleep since starting 2nd round of chemo. RN reviewed current medications pt takes at night- melatonin, lorazepam, xanax (2 tablets), clonopin. Goes to sleep for a couple of hours, then wakes up ~ 1:00am and has a glass of wine. Does not get back to sleep for rest of night.   Wants to know what else she can do to get some rest.    Dr Alvy Bimler states she should NOT be taking lorazepam, xanax and clonopin at the same time. Needs to pick ONLY one of the three to take. Will order trazadone 75 mg.   Pt notified of message.

## 2017-09-05 ENCOUNTER — Telehealth: Payer: Self-pay

## 2017-09-05 NOTE — Telephone Encounter (Signed)
Patient called and left message to call her back.  Called back she is having trouble sleeping at night. She is having restless leg problems at night. She sees Dr. Redmond Pulling for this problem and he ordered the Klonopin. Has appt scheduled.  Told her per Dr. Alvy Bimler, that the Trazodone can take 1-2 weeks to start helping. Verbalized understanding.

## 2017-09-08 ENCOUNTER — Ambulatory Visit (HOSPITAL_COMMUNITY)
Admission: RE | Admit: 2017-09-08 | Discharge: 2017-09-08 | Disposition: A | Discharge status: Home / Self Care | Payer: Medicare Other | Source: Ambulatory Visit | Attending: Hematology and Oncology | Admitting: Hematology and Oncology

## 2017-09-08 ENCOUNTER — Other Ambulatory Visit: Payer: Self-pay | Admitting: Hematology and Oncology

## 2017-09-08 ENCOUNTER — Other Ambulatory Visit: Payer: Self-pay | Admitting: Radiology

## 2017-09-08 DIAGNOSIS — I10 Essential (primary) hypertension: Secondary | ICD-10-CM | POA: Insufficient documentation

## 2017-09-08 DIAGNOSIS — C541 Malignant neoplasm of endometrium: Secondary | ICD-10-CM | POA: Insufficient documentation

## 2017-09-08 DIAGNOSIS — Z5111 Encounter for antineoplastic chemotherapy: Secondary | ICD-10-CM | POA: Diagnosis not present

## 2017-09-08 DIAGNOSIS — Z923 Personal history of irradiation: Secondary | ICD-10-CM | POA: Insufficient documentation

## 2017-09-08 NOTE — Progress Notes (Signed)
  Echocardiogram 2D Echocardiogram has been performed.  Merrie Roof F 09/08/2017, 12:17 PM

## 2017-09-10 ENCOUNTER — Encounter: Payer: Self-pay | Admitting: Pharmacist

## 2017-09-10 ENCOUNTER — Other Ambulatory Visit: Payer: Self-pay | Admitting: Hematology and Oncology

## 2017-09-10 ENCOUNTER — Ambulatory Visit (HOSPITAL_COMMUNITY)
Admission: RE | Admit: 2017-09-10 | Discharge: 2017-09-10 | Disposition: A | Discharge status: Home / Self Care | Payer: Medicare Other | Source: Ambulatory Visit | Attending: Hematology and Oncology | Admitting: Hematology and Oncology

## 2017-09-10 ENCOUNTER — Encounter (HOSPITAL_COMMUNITY): Payer: Self-pay

## 2017-09-10 DIAGNOSIS — E039 Hypothyroidism, unspecified: Secondary | ICD-10-CM | POA: Diagnosis not present

## 2017-09-10 DIAGNOSIS — C541 Malignant neoplasm of endometrium: Secondary | ICD-10-CM | POA: Diagnosis present

## 2017-09-10 DIAGNOSIS — I10 Essential (primary) hypertension: Secondary | ICD-10-CM | POA: Insufficient documentation

## 2017-09-10 HISTORY — PX: IR US GUIDE VASC ACCESS RIGHT: IMG2390

## 2017-09-10 HISTORY — PX: IR FLUORO GUIDE PORT INSERTION RIGHT: IMG5741

## 2017-09-10 LAB — CBC WITH DIFFERENTIAL/PLATELET
Basophils Absolute: 0 10*3/uL (ref 0.0–0.1)
Basophils Relative: 0 %
EOS ABS: 0.2 10*3/uL (ref 0.0–0.7)
EOS PCT: 3 %
HCT: 30.1 % — ABNORMAL LOW (ref 36.0–46.0)
Hemoglobin: 9.5 g/dL — ABNORMAL LOW (ref 12.0–15.0)
LYMPHS ABS: 1.4 10*3/uL (ref 0.7–4.0)
Lymphocytes Relative: 15 %
MCH: 25.3 pg — AB (ref 26.0–34.0)
MCHC: 31.6 g/dL (ref 30.0–36.0)
MCV: 80.3 fL (ref 78.0–100.0)
Monocytes Absolute: 0.5 10*3/uL (ref 0.1–1.0)
Monocytes Relative: 6 %
Neutro Abs: 7.1 10*3/uL (ref 1.7–7.7)
Neutrophils Relative %: 76 %
PLATELETS: 308 10*3/uL (ref 150–400)
RBC: 3.75 MIL/uL — ABNORMAL LOW (ref 3.87–5.11)
RDW: 19 % — ABNORMAL HIGH (ref 11.5–15.5)
WBC: 9.3 10*3/uL (ref 4.0–10.5)

## 2017-09-10 LAB — PROTIME-INR
INR: 0.93
PROTHROMBIN TIME: 12.4 s (ref 11.4–15.2)

## 2017-09-10 MED ORDER — FENTANYL CITRATE (PF) 100 MCG/2ML IJ SOLN
INTRAMUSCULAR | Status: AC | PRN
  Administered 2017-09-10 (×2): 50 ug via INTRAVENOUS

## 2017-09-10 MED ORDER — HEPARIN SOD (PORK) LOCK FLUSH 100 UNIT/ML IV SOLN
INTRAVENOUS | Status: AC | PRN
  Administered 2017-09-10: 500 [IU] via INTRAVENOUS

## 2017-09-10 MED ORDER — FENTANYL CITRATE (PF) 100 MCG/2ML IJ SOLN
INTRAMUSCULAR | Status: AC
  Filled 2017-09-10: qty 2

## 2017-09-10 MED ORDER — MIDAZOLAM HCL 2 MG/2ML IJ SOLN
INTRAMUSCULAR | Status: AC
  Filled 2017-09-10: qty 4

## 2017-09-10 MED ORDER — MIDAZOLAM HCL 2 MG/2ML IJ SOLN
INTRAMUSCULAR | Status: AC | PRN
  Administered 2017-09-10 (×3): 1 mg via INTRAVENOUS

## 2017-09-10 MED ORDER — HEPARIN SOD (PORK) LOCK FLUSH 100 UNIT/ML IV SOLN
INTRAVENOUS | Status: AC
  Filled 2017-09-10: qty 5

## 2017-09-10 MED ORDER — LIDOCAINE-EPINEPHRINE (PF) 1 %-1:200000 IJ SOLN
INTRAMUSCULAR | Status: AC | PRN
  Administered 2017-09-10: 10 mL

## 2017-09-10 MED ORDER — LIDOCAINE HCL (PF) 1 % IJ SOLN
INTRAMUSCULAR | Status: AC
  Filled 2017-09-10: qty 30

## 2017-09-10 MED ORDER — LIDOCAINE-EPINEPHRINE (PF) 1 %-1:200000 IJ SOLN
INTRAMUSCULAR | Status: AC | PRN
  Administered 2017-09-10: 20 mL

## 2017-09-10 MED ORDER — CEFAZOLIN SODIUM-DEXTROSE 2-4 GM/100ML-% IV SOLN
2.0000 g | INTRAVENOUS | Status: AC
  Administered 2017-09-10: 2 g via INTRAVENOUS

## 2017-09-10 MED ORDER — CEFAZOLIN SODIUM-DEXTROSE 2-4 GM/100ML-% IV SOLN
INTRAVENOUS | Status: AC
  Administered 2017-09-10: 2 g via INTRAVENOUS
  Filled 2017-09-10: qty 100

## 2017-09-10 MED ORDER — SODIUM CHLORIDE 0.9 % IV SOLN
INTRAVENOUS | Status: DC
  Administered 2017-09-10: 13:00:00 via INTRAVENOUS

## 2017-09-10 MED ORDER — LIDOCAINE-EPINEPHRINE (PF) 2 %-1:200000 IJ SOLN
INTRAMUSCULAR | Status: AC
  Filled 2017-09-10: qty 20

## 2017-09-10 NOTE — Consult Note (Signed)
Chief Complaint: Patient was seen in consultation today for Port-A-Cath placement  Referring Physician(s): Gorsuch,Ni  Supervising Physician: Arne Cleveland  Patient Status: Michelle Rosario  History of Present Illness: Michelle Rosario is a 72 y.o. female with history of progressive/recurrent endometrial carcinoma who presents today for Port-A-Cath placement for chemotherapy.  Past Medical History:  Diagnosis Date  . Broken ribs    hx of  . Family history of adverse reaction to anesthesia    mother post op N&V  . History of radiation therapy 06/24/16-08/01/16 and 08/13/16, 08/20/16, 08/27/16, 09/04/16   pelvis treated to 55 Gy in 25 fractions, vaginal cuff treated to 24 Gy in 4 fractions  . Hypertension   . Hypothyroidism   . Uterine cancer [facility])    May 30 , 2017    Past Surgical History:  Procedure Laterality Date  . achellis tendon repaired in 2005    . ANKLE SURGERY Right Oct 2006  . fisure  2014  . LYMPH NODE BIOPSY N/A 01/02/2016   Procedure: SENTINAL LYMPH NODE BIOPSY;  Surgeon: Everitt Amber, MD;  Location: [facility];  Service: Gynecology;  Laterality: N/A;  . rectal fissure surgery 2010    . REPLACEMENT TOTAL KNEE Left 2015  . ROBOTIC ASSISTED TOTAL HYSTERECTOMY WITH BILATERAL SALPINGO OOPHERECTOMY Bilateral 01/02/2016   Procedure: XI ROBOTIC ASSISTED TOTAL LAPAROSCOPIC HYSTERECTOMY WITH BILATERAL SALPINGO OOPHORECTOMY;  Surgeon: Everitt Amber, MD;  Location: [facility];  Service: Gynecology;  Laterality: Bilateral;  . TUBAL LIGATION      Allergies: Latex  Medications: Prior to Admission medications   Medication Sig Start Date End Date Taking? Authorizing Provider  acetaminophen (TYLENOL) 325 MG tablet Take 650 mg by mouth every 6 (six) hours as needed.   Yes [provider]  ALPRAZolam Duanne Moron) 1 MG tablet Take one tablet at bedtime as needed for insomnia and restless legs. 02/09/16  Yes [provider]  Biotin w/ Vitamins C & E (HAIR/SKIN/NAILS PO) Take 1  tablet by mouth daily.   Yes [provider]  Calcium Carb-Cholecalciferol (CALCIUM 600 + D PO) Take 2 tablets by mouth daily.   Yes [provider]  clonazePAM (KLONOPIN) 0.5 MG tablet Take 1 tablet (0.5 mg total) by mouth daily as needed (restless legs). Patient taking differently: Take 0.5 mg by mouth at bedtime as needed (restless legs).  12/31/16  Yes Gorsuch, Ni, MD  fluticasone (FLONASE) 50 MCG/ACT nasal spray Place 1 spray into both nostrils 2 (two) times daily as needed for allergies.  06/10/16  Yes [provider]  gabapentin (NEURONTIN) 600 MG tablet TAKE 1 TABLET BY MOUTH TWICE A DAY Patient taking differently: Take 1 tablet 2 times a day 10/10/16  Yes Gorsuch, Ni, MD  HYDROmorphone (DILAUDID) 4 MG tablet Take 1 tablet (4 mg total) by mouth every 4 (four) hours as needed for severe pain. 07/28/17  Yes Gorsuch, Ni, MD  levothyroxine (SYNTHROID, LEVOTHROID) 88 MCG tablet Take 88 mcg by mouth daily. 08/12/16  Yes [provider]  loratadine (CLARITIN) 10 MG tablet Take 10 mg by mouth daily.   Yes [provider]  LORazepam (ATIVAN) 1 MG tablet Take 1 tablet (1 mg total) by mouth at bedtime as needed for sleep. 06/19/17  Yes Gery Pray, MD  losartan-hydrochlorothiazide (HYZAAR) 100-12.5 MG tablet Take 1/2 tablet daily for hypertension. 02/20/17  Yes [provider]  Magnesium 250 MG TABS Take 250 mg by mouth daily.   Yes [provider]  Melatonin 10 MG CAPS Take  10 mg by mouth at bedtime.   Yes [provider]  ondansetron (ZOFRAN) 8 MG tablet Take 1 tablet (8 mg total) by mouth every 8 (eight) hours as needed for nausea. 08/13/16  Yes Gorsuch, Ni, MD  ondansetron (ZOFRAN) 8 MG tablet Take 1 tablet (8 mg total) by mouth 2 (two) times daily as needed (Nausea or vomiting). 08/28/17  Yes Gorsuch, Ni, MD  potassium gluconate (HM POTASSIUM) 595 (99 K) MG TABS tablet Take 595 mg by mouth daily.   Yes [provider]    prochlorperazine (COMPAZINE) 10 MG tablet Take 1 tablet (10 mg total) by mouth every 6 (six) hours as needed (Nausea or vomiting). 08/28/17  Yes Heath Lark, MD  traZODone (DESYREL) 50 MG tablet Take 1.5 tablets (75 mg total) by mouth at bedtime. 09/02/17  Yes Gorsuch, Ni, MD  amoxicillin (AMOXIL) 500 MG capsule Take 2,000 mg by mouth See admin instructions. Prior to dental appointment, takes 4 tables prior to appt 11/04/16   [provider]  chlorhexidine (PERIDEX) 0.12 % solution Use as directed 15 mLs in the mouth or throat 2 (two) times daily. Patient not taking: Reported on 07/07/2017 07/29/16   Gordy Levan, MD  ibuprofen (ADVIL,MOTRIN) 200 MG tablet Take 400 mg by mouth every 8 (eight) hours as needed for mild pain.    [provider]  lidocaine-prilocaine (EMLA) cream Apply to affected area once 08/28/17   Heath Lark, MD     Family History  Problem Relation Age of Onset  . Anesthesia problems Mother   . Uterine cancer Mother 62  . Bladder Cancer Father 51  . Diabetes Sister   . Rectal cancer Cousin 67       d.69    Social History   Socioeconomic History  . Marital status: Divorced    Spouse name: None  . Number of children: 1  . Years of education: None  . Highest education level: None  Social Needs  . Financial resource strain: None  . Food insecurity - worry: None  . Food insecurity - inability: None  . Transportation needs - medical: None  . Transportation needs - non-medical: None  Occupational History  . Occupation: retired Sales executive  Tobacco Use  . Smoking status: Never Smoker  . Smokeless tobacco: Never Used  Substance and Sexual Activity  . Alcohol use: Yes    Comment: socially  . Drug use: No  . Sexual activity: No  Other Topics Concern  . None  Social History Narrative  . None      Review of Systems denies fever, headache, chest pain, dyspnea, cough, dominant pain, nausea, vomiting or bleeding.  She does have some intermittent  low back pain  Vital Signs: BP (!) 141/82   Pulse 88   Temp 98.7 F (37.1 C) (Oral)   Resp 16   SpO2 97%   Physical Exam awake, alert.  Chest clear to auscultation bilaterally.  Heart with regular rate and rhythm.  Abdomen obese, soft, positive bowel sounds, nontender.  No lower extremity edema  Imaging: Ct Abdomen Pelvis W Contrast  Result Date: 08/26/2017 CLINICAL DATA:  Followup recurrent endometrial carcinoma. Undergoing chemotherapy. Previous hysterectomy and radiation therapy. EXAM: CT ABDOMEN AND PELVIS WITH CONTRAST TECHNIQUE: Multidetector CT imaging of the abdomen and pelvis was performed using the standard protocol following bolus administration of intravenous contrast. CONTRAST:  164mL ISOVUE-300 IOPAMIDOL (ISOVUE-300) INJECTION 61% COMPARISON:  05/26/2017 FINDINGS: Lower Chest: No acute findings. Hepatobiliary: Several small low-attenuation masses are seen  in the right hepatic lobe which are new since previous study, consistent with liver metastases. Largest in the right hepatic lobe measures 1.8 x 1.6 cm on image 27/2. Gallbladder is unremarkable. No evidence of biliary ductal dilatation. Pancreas:  No mass or inflammatory changes. Spleen: Within normal limits in size and appearance. Adrenals/Urinary Tract: No masses identified. No evidence of hydronephrosis. Stomach/Bowel: No evidence of obstruction, inflammatory process or abnormal fluid collections. Vascular/Lymphatic: No pathologically enlarged lymph nodes. No abdominal aortic aneurysm. Reproductive: Prior hysterectomy. Irregular soft tissue mass in the left adnexa has increased in size, currently measuring 3.8 x 3.6 cm on image 67/2, compared to 2.6 x 2.4 cm previously. No new masses are identified. No evidence of ascites. Other:  None. Musculoskeletal:  No suspicious bone lesions identified. IMPRESSION: Increased size of mass in the left adnexal region. Several new small liver metastases in right hepatic lobe. Electronically Signed    By: Earle Gell M.D.   On: 08/26/2017 16:29    Labs:  CBC: Recent Labs    07/09/17 1312 07/17/17 1317 08/12/17 1227 08/26/17 1247  WBC 5.3 11.5* 5.5 9.3  HGB 10.9* 11.2* 9.3* 9.3*  HCT 32.4* 34.0* 28.9* 29.3*  PLT 118* 129* 153 289    COAGS: No results for input(s): INR, APTT in the last 8760 hours.  BMP: Recent Labs    07/09/17 1312 07/17/17 1317 08/12/17 1227 08/26/17 1247  NA 141 139 137 142  K 3.9 3.9 4.2 4.0  CL  --  101 103 103  CO2 27 26 25 29   GLUCOSE 109 113 179* 94  BUN 16.4 26 20 14   CALCIUM 9.2 9.8 9.0 9.9  CREATININE 0.8 0.81 0.87 0.81  GFRNONAA  --  >60 >60 >60  GFRAA  --  >60 >60 >60    LIVER FUNCTION TESTS: Recent Labs    07/09/17 1312 07/17/17 1317 08/12/17 1227 08/26/17 1247  BILITOT 0.34 0.4 0.3 0.4  AST 35* 21 29 22   ALT 43 28 48 21  ALKPHOS 182* 142 125 121  PROT 7.1 7.1 6.5 6.6  ALBUMIN 3.1* 3.3* 2.9* 3.0*    TUMOR MARKERS: No results for input(s): AFPTM, CEA, CA199, CHROMGRNA in the last 8760 hours.  Assessment and Plan: 72 y.o. female with history of progressive/recurrent endometrial carcinoma who presents today for Port-A-Cath placement for chemotherapy.Risks and benefits of image guided port-a-catheter placement was discussed with the patient/family including, but not limited to bleeding, infection, pneumothorax, or fibrin sheath development and need for additional procedures.  All of the patient's questions were answered, patient is agreeable to proceed. Consent signed and in chart.  Labs pending   Thank you for this interesting consult.  I greatly enjoyed meeting ALEXZANDREA NORMINGTON and look forward to participating in their care.  A copy of this report was sent to the requesting provider on this date.  Electronically Signed: D. Rowe Robert, PA-C 09/10/2017, 1:14 PM   I spent a total of 25 minutes    in face to face in clinical consultation, greater than 50% of which was counseling/coordinating care for Port-A-Cath  placement

## 2017-09-10 NOTE — Procedures (Signed)
  Procedure: R IJ PowerPort   EBL:   minimal Complications:  none immediate  See full dictation in [facility] PACS.  D. Tylynn Braniff MD Main # [phone] Pager  [phone]    

## 2017-09-10 NOTE — Discharge Instructions (Signed)
You may remove dressing and bathe in 24 hours.  ° °Implanted Port Insertion, Care After °This sheet gives you information about how to care for yourself after your procedure. Your health care provider may also give you more specific instructions. If you have problems or questions, contact your health care provider. °What can I expect after the procedure? °After your procedure, it is common to have: °· Discomfort at the port insertion site. °· Bruising on the skin over the port. This should improve over 3-4 days. ° °Follow these instructions at home: °Port care °· After your port is placed, you will get a manufacturer's information card. The card has information about your port. Keep this card with you at all times. °· Take care of the port as told by your health care provider. Ask your health care provider if you or a family member can get training for taking care of the port at home. A home health care nurse may also take care of the port. °· Make sure to remember what type of port you have. °Incision care °· Follow instructions from your health care provider about how to take care of your port insertion site. Make sure you: °? Wash your hands with soap and water before you change your bandage (dressing). If soap and water are not available, use hand sanitizer. °? Change your dressing as told by your health care provider. °? Leave stitches (sutures), skin glue, or adhesive strips in place. These skin closures may need to stay in place for 2 weeks or longer. If adhesive strip edges start to loosen and curl up, you may trim the loose edges. Do not remove adhesive strips completely unless your health care provider tells you to do that. °· Check your port insertion site every day for signs of infection. Check for: °? More redness, swelling, or pain. °? More fluid or blood. °? Warmth. °? Pus or a bad smell. °General instructions °· Do not take baths, swim, or use a hot tub until your health care provider approves. °· Do  not lift anything that is heavier than 10 lb (4.5 kg) for a week, or as told by your health care provider. °· Ask your health care provider when it is okay to: °? Return to work or school. °? Resume usual physical activities or sports. °· Do not drive for 24 hours if you were given a medicine to help you relax (sedative). °· Take over-the-counter and prescription medicines only as told by your health care provider. °· Wear a medical alert bracelet in case of an emergency. This will tell any health care providers that you have a port. °· Keep all follow-up visits as told by your health care provider. This is important. °Contact a health care provider if: °· You cannot flush your port with saline as directed, or you cannot draw blood from the port. °· You have a fever or chills. °· You have more redness, swelling, or pain around your port insertion site. °· You have more fluid or blood coming from your port insertion site. °· Your port insertion site feels warm to the touch. °· You have pus or a bad smell coming from the port insertion site. °Get help right away if: °· You have chest pain or shortness of breath. °· You have bleeding from your port that you cannot control. °Summary °· Take care of the port as told by your health care provider. °· Change your dressing as told by your health care provider. °·   Keep all follow-up visits as told by your health care provider. °This information is not intended to replace advice given to you by your health care provider. Make sure you discuss any questions you have with your health care provider. °Document Released: 04/14/2013 Document Revised: 05/15/2016 Document Reviewed: 05/15/2016 °Elsevier Interactive Patient Education © 2017 [employer] ° ° ° °Moderate Conscious Sedation, Adult, Care After °These instructions provide you with information about caring for yourself after your procedure. Your health care provider may also give you more specific instructions. Your  treatment has been planned according to current medical practices, but problems sometimes occur. Call your health care provider if you have any problems or questions after your procedure. °What can I expect after the procedure? °After your procedure, it is common: °· To feel sleepy for several hours. °· To feel clumsy and have poor balance for several hours. °· To have poor judgment for several hours. °· To vomit if you eat too soon. ° °Follow these instructions at home: °For at least 24 hours after the procedure: ° °· Do not: °? Participate in activities where you could fall or become injured. °? Drive. °? Use heavy machinery. °? Drink alcohol. °? Take sleeping pills or medicines that cause drowsiness. °? Make important decisions or sign legal documents. °? Take care of children on your own. °· Rest. °Eating and drinking °· Follow the diet recommended by your health care provider. °· If you vomit: °? Drink water, juice, or soup when you can drink without vomiting. °? Make sure you have little or no nausea before eating solid foods. °General instructions °· Have a responsible adult stay with you until you are awake and alert. °· Take over-the-counter and prescription medicines only as told by your health care provider. °· If you smoke, do not smoke without supervision. °· Keep all follow-up visits as told by your health care provider. This is important. °Contact a health care provider if: °· You keep feeling nauseous or you keep vomiting. °· You feel light-headed. °· You develop a rash. °· You have a fever. °Get help right away if: °· You have trouble breathing. °This information is not intended to replace advice given to you by your health care provider. Make sure you discuss any questions you have with your health care provider. °Document Released: 04/14/2013 Document Revised: 11/27/2015 Document Reviewed: 10/14/2015 °Elsevier Interactive Patient Education © 2018 [employer] Inc[employer] ° °

## 2017-09-11 ENCOUNTER — Encounter (HOSPITAL_COMMUNITY): Payer: Self-pay | Admitting: Interventional Radiology

## 2017-09-11 ENCOUNTER — Telehealth: Payer: Self-pay

## 2017-09-11 NOTE — Telephone Encounter (Signed)
-----   Message from Heath Lark, MD sent at 09/11/2017 12:12 PM EST ----- Regarding: RE: Authorization Pending Pls keep me posted Hassan Rowan, pls call her & cancel her appt and we will call her and we will reschedule her appt as soon as we know  ----- Message ----- From: Gaspar Bidding Sent: 09/11/2017  12:07 PM To: Gaspar Bidding, Flo Shanks, RN, # Subject: Authorization Pending                          Hello Dr. Alvy Bimler,  The authorization is still pending. Please reschedule appt 3/8. Karena Addison will keep you posted on the auth status.  Thank you, Darlena

## 2017-09-11 NOTE — Telephone Encounter (Signed)
Called with below message. Verbalized understanding. 

## 2017-09-12 ENCOUNTER — Other Ambulatory Visit: Payer: Federal, State, Local not specified - PPO

## 2017-09-12 ENCOUNTER — Ambulatory Visit: Payer: Federal, State, Local not specified - PPO

## 2017-09-12 ENCOUNTER — Telehealth: Payer: Self-pay | Admitting: *Deleted

## 2017-09-12 ENCOUNTER — Ambulatory Visit: Payer: Federal, State, Local not specified - PPO | Admitting: Hematology and Oncology

## 2017-09-12 NOTE — Telephone Encounter (Signed)
"  Port-a-cath placed with instructions not to remove big bulky dressing until after chemotherapy.  Turn around in shower letting water trickle over port-a-cath.  Chemotherapy cancelled due to insurance.  Not scheduled again until March 29 th.  What do I do to shower?"  Derma bond used.  Confirmed dressing protocol with Tiffany, Interventional Radiology.   .  Advised to carefully remove dressing leaving shiny clear Derma Bond in place.  Derma Bond used to secure skin edges for healing will eventually fall off.  Offered to hold call while she removes dressing.  "No I'll leave it on and just cover it with plastic to protect it from getting wet."

## 2017-09-15 ENCOUNTER — Telehealth: Payer: Self-pay | Admitting: Hematology and Oncology

## 2017-09-15 NOTE — Telephone Encounter (Signed)
Patient was calling regarding her schedule and insurance authorization.   Spoke with Tammi and went ahead and scheduled patient for treatment on 3/14.

## 2017-09-18 ENCOUNTER — Inpatient Hospital Stay: Payer: Medicare Other

## 2017-09-18 ENCOUNTER — Encounter: Payer: Self-pay | Admitting: Hematology and Oncology

## 2017-09-18 ENCOUNTER — Telehealth: Payer: Self-pay | Admitting: Hematology and Oncology

## 2017-09-18 ENCOUNTER — Inpatient Hospital Stay: Payer: Medicare Other | Attending: Hematology and Oncology | Admitting: Hematology and Oncology

## 2017-09-18 DIAGNOSIS — T451X5A Adverse effect of antineoplastic and immunosuppressive drugs, initial encounter: Secondary | ICD-10-CM

## 2017-09-18 DIAGNOSIS — Z5111 Encounter for antineoplastic chemotherapy: Secondary | ICD-10-CM | POA: Diagnosis present

## 2017-09-18 DIAGNOSIS — R634 Abnormal weight loss: Secondary | ICD-10-CM | POA: Diagnosis not present

## 2017-09-18 DIAGNOSIS — C541 Malignant neoplasm of endometrium: Secondary | ICD-10-CM

## 2017-09-18 DIAGNOSIS — C787 Secondary malignant neoplasm of liver and intrahepatic bile duct: Secondary | ICD-10-CM

## 2017-09-18 DIAGNOSIS — D6481 Anemia due to antineoplastic chemotherapy: Secondary | ICD-10-CM

## 2017-09-18 DIAGNOSIS — D539 Nutritional anemia, unspecified: Secondary | ICD-10-CM

## 2017-09-18 LAB — CMP (CANCER CENTER ONLY)
ALK PHOS: 183 U/L — AB (ref 40–150)
ALT: 22 U/L (ref 0–55)
ANION GAP: 7 (ref 3–11)
AST: 21 U/L (ref 5–34)
Albumin: 3.1 g/dL — ABNORMAL LOW (ref 3.5–5.0)
BUN: 16 mg/dL (ref 7–26)
CALCIUM: 9.8 mg/dL (ref 8.4–10.4)
CO2: 28 mmol/L (ref 22–29)
CREATININE: 0.8 mg/dL (ref 0.60–1.10)
Chloride: 101 mmol/L (ref 98–109)
GFR, Est AFR Am: >60 mL/min (ref >60)
Glucose, Bld: 105 mg/dL (ref 70–140)
Potassium: 3.7 mmol/L (ref 3.5–5.1)
SODIUM: 136 mmol/L (ref 136–145)
Total Bilirubin: 0.5 mg/dL (ref 0.2–1.2)
Total Protein: 7.1 g/dL (ref 6.4–8.3)

## 2017-09-18 LAB — VITAMIN B12: VITAMIN B 12: 579 pg/mL (ref 180–914)

## 2017-09-18 LAB — CBC WITH DIFFERENTIAL (CANCER CENTER ONLY)
BASOS ABS: 0 10*3/uL (ref 0.0–0.1)
BASOS PCT: 1 %
EOS ABS: 0.1 10*3/uL (ref 0.0–0.5)
Eosinophils Relative: 2 %
HEMATOCRIT: 27.7 % — AB (ref 34.8–46.6)
HEMOGLOBIN: 9.2 g/dL — AB (ref 11.6–15.9)
Lymphocytes Relative: 11 %
Lymphs Abs: 0.8 10*3/uL — ABNORMAL LOW (ref 0.9–3.3)
MCH: 25.1 pg (ref 25.1–34.0)
MCHC: 33.1 g/dL (ref 31.5–36.0)
MCV: 75.9 fL — ABNORMAL LOW (ref 79.5–101.0)
Monocytes Absolute: 0.5 10*3/uL (ref 0.1–0.9)
Monocytes Relative: 7 %
NEUTROS ABS: 6 10*3/uL (ref 1.5–6.5)
NEUTROS PCT: 79 %
Platelet Count: 265 10*3/uL (ref 145–400)
RBC: 3.65 MIL/uL — AB (ref 3.70–5.45)
RDW: 20.6 % — ABNORMAL HIGH (ref 11.2–14.5)
WBC: 7.6 10*3/uL (ref 3.9–10.3)

## 2017-09-18 LAB — IRON AND TIBC
IRON: 19 ug/dL — AB (ref 41–142)
SATURATION RATIOS: 8 % — AB (ref 21–57)
TIBC: 225 ug/dL — AB (ref 236–444)
UIBC: 206 ug/dL

## 2017-09-18 LAB — FERRITIN: Ferritin: 440 ng/mL — ABNORMAL HIGH (ref 9–269)

## 2017-09-18 MED ORDER — DEXAMETHASONE SODIUM PHOSPHATE 10 MG/ML IJ SOLN
INTRAMUSCULAR | Status: AC
  Filled 2017-09-18: qty 1

## 2017-09-18 MED ORDER — SODIUM CHLORIDE 0.9% FLUSH
10.0000 mL | Freq: Once | INTRAVENOUS | Status: AC
  Administered 2017-09-18: 10 mL via INTRAVENOUS
  Filled 2017-09-18: qty 10

## 2017-09-18 MED ORDER — SODIUM CHLORIDE 0.9% FLUSH
10.0000 mL | INTRAVENOUS | Status: DC | PRN
  Administered 2017-09-18: 10 mL
  Filled 2017-09-18: qty 10

## 2017-09-18 MED ORDER — HEPARIN SOD (PORK) LOCK FLUSH 100 UNIT/ML IV SOLN
500.0000 [IU] | Freq: Once | INTRAVENOUS | Status: AC | PRN
  Administered 2017-09-18: 500 [IU]
  Filled 2017-09-18: qty 5

## 2017-09-18 MED ORDER — DEXTROSE 5 % IV SOLN
Freq: Once | INTRAVENOUS | Status: AC
  Administered 2017-09-18: 13:00:00 via INTRAVENOUS

## 2017-09-18 MED ORDER — DEXTROSE 5 % IV SOLN
38.0000 mg/m2 | Freq: Once | INTRAVENOUS | Status: AC
  Administered 2017-09-18: 80 mg via INTRAVENOUS
  Filled 2017-09-18: qty 40

## 2017-09-18 MED ORDER — DEXAMETHASONE SODIUM PHOSPHATE 10 MG/ML IJ SOLN
10.0000 mg | Freq: Once | INTRAMUSCULAR | Status: AC
  Administered 2017-09-18: 10 mg via INTRAVENOUS

## 2017-09-18 NOTE — Progress Notes (Signed)
[place] OFFICE PROGRESS NOTE  Patient Care Team: [name], MD as PCP - General (Family Medicine)  ASSESSMENT & PLAN:  Recurrent carcinoma of endometrium [facility]) I have reviewed the current guidelines with the patient We discussed the risk, benefits, side effects of Doxil and she agreed to proceed Baseline echocardiogram is within normal limits I plan to repeat imaging study after 3 cycles, for maximum dose of 6-8 cycles. On further review of molecular testing from her tissue biopsy from 2017, it was noted that she was MSI high based on foundation one study She would qualify for immunotherapy in the future  Metastasis to liver [facility]) I counted 3 liver lesions on recent CT The patient is not symptomatic Liver function is stable For this reason, she would not need dose adjustment for chemotherapy  Anemia due to antineoplastic chemotherapy She has multifactorial anemia, likely anemia chronic disease and possible component of iron deficiency Iron studies are pending The patient has poor diet due to reduced appetite and nausea We discussed iron rich diet If she  is unable to tolerate oral iron supplements, I would plan to give her intravenous iron infusion   No orders of the defined types were placed in this encounter.   INTERVAL HISTORY: Please see below for problem oriented charting. She returns today to start first dose of chemotherapy She complained of pain near the port insertion site She also had poor appetite with nausea She has lost some weight She denies abdominal bloating, constipation or vaginal bleeding She denies recent infection  SUMMARY OF ONCOLOGIC HISTORY: Oncology History   The patient is a 72 year old G1P1 who was initially seen in consultation at the request of Dr Pamala Hurry for grade 2 endometrial cancer. A transvaginal ultrasound scan was performed on 09/11/2015. It revealed a uterus measuring 7.2 x 6 x 4.5 cm with a thickened endometrial  stripe of 29 mm. The ovaries were normal bilaterally. 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  MSI stable on June 2017 tissue but high from Foundation One study from November 2017  05/15/16: ER is moderately positive (70%). PR is strongly positive (80%).     Recurrent carcinoma of endometrium ([place])   01/02/2016 Pathology Results    Uterus +/- tubes/ovaries, neoplastic, cervix ENDOMETRIAL ADENOCARCINOMA, FIGO GRADE 2 (4.7 CM) THE TUMOR INVADES LESS THAN ONE-HALF OF THE MYOMETRIUM (PT1A) ALL MARGINS OF RESECTION ARE NEGATIVE FOR CARCINOMA LEIOMYOMAS AND ADENOMYOSIS BILATERAL FALLOPIAN TUBES AND OVARIES: HISTOLOGICAL UNREMARKABLE 2. Lymph node, sentinel, biopsy, right obturator ONE BENIGN LYMPH NODE (0/1) 3. Lymph nodes, regional resection, left pelvic FOUR BENIGN LYMPH NODES (0/4)      01/02/2016 Surgery    Dr. Denman George performed robotic-assisted laparoscopic total hysterectomy with bilateral salpingoophorectomy, sentinel lymph node biopsy, lymphadenectomy        05/15/2016 Pathology Results    Vagina, biopsy, mid - ADENOCARCINOMA, SEE COMMENT. Microscopic Comment The morphology along with the patient's history are consistent with recurrent endometrioid adenocarcinoma. The carcinoma has a similar appearance to the primary ([id number]).      05/20/2016 Imaging    Ct scan abdomen showed solid 2.5 cm peritoneal mass in the mid to anterior left pelvis, suspicious for peritoneal metastasis. 2. Small expansile low-attenuation filling defect in the left external iliac vein, cannot exclude a small deep venous thrombus. Consider correlation with left lower extremity venous Doppler scan. 3. Small simple fluid density structure in the left pelvic sidewall abutting the left  external iliac vessels, favor a small postoperative seroma. 4. No ascites. 5. No lymphadenopathy.  No metastatic disease in the chest. 6. Aortic  atherosclerosis.      06/12/2016 PET scan    Intensely hypermetabolic 2.1 cm central pelvic peritoneal mass just to the left of midline, consistent with peritoneal metastatic recurrence. No ascites. 2. No additional hypermetabolic sites of metastatic disease. 3. Diffuse thyroid hypermetabolism without discrete thyroid nodule, favoring thyroiditis. Recommend correlation with serum thyroid function tests.      06/24/2016 - 07/22/2016 Chemotherapy    The patient had weekly cisplatin. She has missed several doses due to infection and pancytopenia      06/24/2016 - 09/04/2016 Radiation Therapy    She completed concurrent radiation therapy Radiation treatment dates:   IMRT : 06/24/16 - 08/01/16 HDR : 08/13/16, 08/20/16, 08/27/16, 09/04/16  Site/dose:   Pelvis treated to 55 Gy in 25 fractions (simultaneous integrated boost technique) Vaginal Cuff treated to 24 Gy in 4 fractions      08/15/2016 - 12/03/2016 Chemotherapy    She received 6 cycles of carboplatin/Taxol      09/27/2016 Imaging    Interval decrease in size of previously described solid peritoneal nodule within the left anterior pelvis. Near complete resolution of previously described low-attenuation structure along the left pelvic sidewall. No evidence for metastatic disease in the chest. Aortic atherosclerosis.      12/30/2016 Imaging    Ct abdomen 1. Solitary left pelvic peritoneal implant is mildly decreased in size in the interval. 2. No new or progressive metastatic disease in the abdomen or pelvis. No ascites. 3. Aortic atherosclerosis.      04/02/2017 Imaging    Left lower quadrant peritoneal implant referenced on previous exam measures 1.7 x 1.3 cm, image 69 of series 2. Increased from 0.8 x 0.8 cm previously peer no new peritoneal implants identified.  Musculoskeletal: The degenerative disc disease noted within the lumbar spine.  IMPRESSION: 1. Solitary left pelvic peritoneal implant is increased in size in the  interval. 2. No new sites of disease.  No ascites. 3. Aortic atherosclerosis      04/11/2017 PET scan    1. The left side of pelvis peritoneal implant has decreased in size and degree of FDG uptake compatible with response to therapy. No new areas of peritoneal disease identified. 2. Persistent diffuse increased uptake within the thyroid gland. Correlation with patient's thyroid function may be helpful.      05/26/2017 Imaging    1. Enlarging tumor implant along the left adnexa, currently 2.6 by 2.4 cm and previously 1.9 by 1.3 cm. No new tumor implant or other specific cause for the patient's pelvic symptoms is currently identified. 2.  Aortic Atherosclerosis (ICD10-I70.0). 3. Lumbar spondylosis and degenerative disc disease causing multilevel impingement.      06/04/2017 - 08/28/2017 Chemotherapy    The patient started letrozole and everolimus      08/26/2017 Imaging    Increased size of mass in the left adnexal region.  Several new small liver metastases in right hepatic lobe      09/08/2017 Imaging    LV EF: 60% -  65%      09/10/2017 Procedure    Technically successful right IJ power-injectable port catheter placement. Ready for routine use       REVIEW OF SYSTEMS:   Constitutional: Denies fevers, chills or abnormal weight loss Eyes: Denies blurriness of vision Ears, nose, mouth, throat, and face: Denies mucositis or sore throat Respiratory: Denies cough, dyspnea or  wheezes Cardiovascular: Denies palpitation, chest discomfort or lower extremity swelling Gastrointestinal:  Denies nausea, heartburn or change in bowel habits Skin: Denies abnormal skin rashes Lymphatics: Denies new lymphadenopathy or easy bruising Neurological:Denies numbness, tingling or new weaknesses Behavioral/Psych: Mood is stable, no new changes  All other systems were reviewed with the patient and are negative.  I have reviewed the past medical history, past surgical history, social history and  family history with the patient and they are unchanged from previous note.  ALLERGIES:  is allergic to latex.  MEDICATIONS:  Current Outpatient Medications  Medication Sig Dispense Refill  . acetaminophen (TYLENOL) 325 MG tablet Take 650 mg by mouth every 6 (six) hours as needed.    . ALPRAZolam (XANAX) 1 MG tablet Take one tablet at bedtime as needed for insomnia and restless legs.    Marland Kitchen amoxicillin (AMOXIL) 500 MG capsule Take 2,000 mg by mouth See admin instructions. Prior to dental appointment, takes 4 tables prior to appt    . Biotin w/ Vitamins C & E (HAIR/SKIN/NAILS PO) Take 1 tablet by mouth daily.    . Calcium Carb-Cholecalciferol (CALCIUM 600 + D PO) Take 2 tablets by mouth daily.    . chlorhexidine (PERIDEX) 0.12 % solution Use as directed 15 mLs in the mouth or throat 2 (two) times daily. (Patient not taking: Reported on 07/07/2017) 120 mL 0  . clonazePAM (KLONOPIN) 0.5 MG tablet Take 1 tablet (0.5 mg total) by mouth daily as needed (restless legs). (Patient taking differently: Take 0.5 mg by mouth at bedtime as needed (restless legs). ) 60 tablet 1  . fluticasone (FLONASE) 50 MCG/ACT nasal spray Place 1 spray into both nostrils 2 (two) times daily as needed for allergies.   1  . gabapentin (NEURONTIN) 600 MG tablet TAKE 1 TABLET BY MOUTH TWICE A DAY (Patient taking differently: Take 1 tablet 2 times a day) 60 tablet 9  . HYDROmorphone (DILAUDID) 4 MG tablet Take 1 tablet (4 mg total) by mouth every 4 (four) hours as needed for severe pain. 60 tablet 0  . ibuprofen (ADVIL,MOTRIN) 200 MG tablet Take 400 mg by mouth every 8 (eight) hours as needed for mild pain.    Marland Kitchen levothyroxine (SYNTHROID, LEVOTHROID) 88 MCG tablet Take 88 mcg by mouth daily.  3  . lidocaine-prilocaine (EMLA) cream Apply to affected area once 30 g 3  . loratadine (CLARITIN) 10 MG tablet Take 10 mg by mouth daily.    Marland Kitchen LORazepam (ATIVAN) 1 MG tablet Take 1 tablet (1 mg total) by mouth at bedtime as needed for sleep.  30 tablet 0  . losartan-hydrochlorothiazide (HYZAAR) 100-12.5 MG tablet Take 1/2 tablet daily for hypertension.    . Magnesium 250 MG TABS Take 250 mg by mouth daily.    . Melatonin 10 MG CAPS Take 10 mg by mouth at bedtime.    . ondansetron (ZOFRAN) 8 MG tablet Take 1 tablet (8 mg total) by mouth every 8 (eight) hours as needed for nausea. 60 tablet 1  . ondansetron (ZOFRAN) 8 MG tablet Take 1 tablet (8 mg total) by mouth 2 (two) times daily as needed (Nausea or vomiting). 30 tablet 1  . potassium gluconate (HM POTASSIUM) 595 (99 K) MG TABS tablet Take 595 mg by mouth daily.    . prochlorperazine (COMPAZINE) 10 MG tablet Take 1 tablet (10 mg total) by mouth every 6 (six) hours as needed (Nausea or vomiting). 30 tablet 1  . traZODone (DESYREL) 50 MG tablet Take 1.5 tablets (75  mg total) by mouth at bedtime. 45 tablet 1   No current facility-administered medications for this visit.    Facility-Administered Medications Ordered in Other Visits  Medication Dose Route Frequency Provider Last Rate Last Dose  . DOXOrubicin HCL LIPOSOMAL (DOXIL) 80 mg in dextrose 5 % 250 mL chemo infusion  38 mg/m2 (Treatment Plan Recorded) Intravenous Once Alvy Bimler, Faizon Capozzi, MD      . heparin lock flush 100 unit/mL  500 Units Intracatheter Once PRN Alvy Bimler, Dafne Nield, MD      . sodium chloride flush (NS) 0.9 % injection 10 mL  10 mL Intracatheter PRN Alvy Bimler, Caress Reffitt, MD        PHYSICAL EXAMINATION: ECOG PERFORMANCE STATUS: 1 - Symptomatic but completely ambulatory  Vitals:   09/18/17 1206  BP: 126/71  Pulse: 88  Resp: 18  Temp: 98.4 F (36.9 C)  SpO2: 98%   Filed Weights   09/18/17 1206  Weight: 206 lb (93.4 kg)    GENERAL:alert, no distress and comfortable SKIN: skin color, texture, turgor are normal, no rashes or significant lesions EYES: normal, Conjunctiva are pink and non-injected, sclera clear OROPHARYNX:no exudate, no erythema and lips, buccal mucosa, and tongue normal  NECK: supple, thyroid normal size,  non-tender, without nodularity LYMPH:  no palpable lymphadenopathy in the cervical, axillary or inguinal LUNGS: clear to auscultation and percussion with normal breathing effort HEART: regular rate & rhythm and no murmurs and no lower extremity edema ABDOMEN:abdomen soft, non-tender and normal bowel sounds Musculoskeletal:no cyanosis of digits and no clubbing  NEURO: alert & oriented x 3 with fluent speech, no focal motor/sensory deficits  LABORATORY DATA:  I have reviewed the data as listed    Component Value Date/Time   NA 136 09/18/2017 1121   NA 141 07/09/2017 1312   K 3.7 09/18/2017 1121   K 3.9 07/09/2017 1312   CL 101 09/18/2017 1121   CO2 28 09/18/2017 1121   CO2 27 07/09/2017 1312   GLUCOSE 105 09/18/2017 1121   GLUCOSE 109 07/09/2017 1312   BUN 16 09/18/2017 1121   BUN 16.4 07/09/2017 1312   CREATININE 0.80 09/18/2017 1121   CREATININE 0.8 07/09/2017 1312   CALCIUM 9.8 09/18/2017 1121   CALCIUM 9.2 07/09/2017 1312   PROT 7.1 09/18/2017 1121   PROT 7.1 07/09/2017 1312   ALBUMIN 3.1 (L) 09/18/2017 1121   ALBUMIN 3.1 (L) 07/09/2017 1312   AST 21 09/18/2017 1121   AST 35 (H) 07/09/2017 1312   ALT 22 09/18/2017 1121   ALT 43 07/09/2017 1312   ALKPHOS 183 (H) 09/18/2017 1121   ALKPHOS 182 (H) 07/09/2017 1312   BILITOT 0.5 09/18/2017 1121   BILITOT 0.34 07/09/2017 1312   GFRNONAA >60 09/18/2017 1121   GFRAA >60 09/18/2017 1121    No results found for: SPEP, UPEP  Lab Results  Component Value Date   WBC 7.6 09/18/2017   NEUTROABS 6.0 09/18/2017   HGB 9.5 (L) 09/10/2017   HCT 27.7 (L) 09/18/2017   MCV 75.9 (L) 09/18/2017   PLT 265 09/18/2017      Chemistry      Component Value Date/Time   NA 136 09/18/2017 1121   NA 141 07/09/2017 1312   K 3.7 09/18/2017 1121   K 3.9 07/09/2017 1312   CL 101 09/18/2017 1121   CO2 28 09/18/2017 1121   CO2 27 07/09/2017 1312   BUN 16 09/18/2017 1121   BUN 16.4 07/09/2017 1312   CREATININE 0.80 09/18/2017 1121    CREATININE 0.8 07/09/2017  1312      Component Value Date/Time   CALCIUM 9.8 09/18/2017 1121   CALCIUM 9.2 07/09/2017 1312   ALKPHOS 183 (H) 09/18/2017 1121   ALKPHOS 182 (H) 07/09/2017 1312   AST 21 09/18/2017 1121   AST 35 (H) 07/09/2017 1312   ALT 22 09/18/2017 1121   ALT 43 07/09/2017 1312   BILITOT 0.5 09/18/2017 1121   BILITOT 0.34 07/09/2017 1312       RADIOGRAPHIC STUDIES: I have personally reviewed the radiological images as listed and agreed with the findings in the report. Ct Abdomen Pelvis W Contrast  Result Date: 08/26/2017 CLINICAL DATA:  Followup recurrent endometrial carcinoma. Undergoing chemotherapy. Previous hysterectomy and radiation therapy. EXAM: CT ABDOMEN AND PELVIS WITH CONTRAST TECHNIQUE: Multidetector CT imaging of the abdomen and pelvis was performed using the standard protocol following bolus administration of intravenous contrast. CONTRAST:  175m ISOVUE-300 IOPAMIDOL (ISOVUE-300) INJECTION 61% COMPARISON:  05/26/2017 FINDINGS: Lower Chest: No acute findings. Hepatobiliary: Several small low-attenuation masses are seen in the right hepatic lobe which are new since previous study, consistent with liver metastases. Largest in the right hepatic lobe measures 1.8 x 1.6 cm on image 27/2. Gallbladder is unremarkable. No evidence of biliary ductal dilatation. Pancreas:  No mass or inflammatory changes. Spleen: Within normal limits in size and appearance. Adrenals/Urinary Tract: No masses identified. No evidence of hydronephrosis. Stomach/Bowel: No evidence of obstruction, inflammatory process or abnormal fluid collections. Vascular/Lymphatic: No pathologically enlarged lymph nodes. No abdominal aortic aneurysm. Reproductive: Prior hysterectomy. Irregular soft tissue mass in the left adnexa has increased in size, currently measuring 3.8 x 3.6 cm on image 67/2, compared to 2.6 x 2.4 cm previously. No new masses are identified. No evidence of ascites. Other:  None.  Musculoskeletal:  No suspicious bone lesions identified. IMPRESSION: Increased size of mass in the left adnexal region. Several new small liver metastases in right hepatic lobe. Electronically Signed   By: JEarle GellM.D.   On: 08/26/2017 16:29   Ir [place] Vasc Access Right  Result Date: 09/11/2017 CLINICAL DATA:  Endometrial carcinoma, needs durable venous access for chemotherapy regimen. EXAM: TUNNELED PORT CATHETER PLACEMENT WITH ULTRASOUND AND FLUOROSCOPIC GUIDANCE FLUOROSCOPY TIME:  0.1 minutes; 7.15 uGym2 DAP ANESTHESIA/SEDATION: Intravenous Fentanyl and Versed were administered as conscious sedation during continuous monitoring of the patient's level of consciousness and physiological / cardiorespiratory status by the radiology RN, with a total moderate sedation time of 13 minutes. TECHNIQUE: The procedure, risks, benefits, and alternatives were explained to the patient. Questions regarding the procedure were encouraged and answered. The patient understands and consents to the procedure. As antibiotic prophylaxis, cefazolin 2 g was ordered pre-procedure and administered intravenously within one hour of incision. Patency of the right IJ vein was confirmed with ultrasound with image documentation. An appropriate skin site was determined. Skin site was marked. Region was prepped using maximum barrier technique including cap and mask, sterile gown, sterile gloves, large sterile sheet, and Chlorhexidine as cutaneous antisepsis. The region was infiltrated locally with 1% lidocaine. Under real-time ultrasound guidance, the right IJ vein was accessed with a 21 gauge micropuncture needle; the needle tip within the vein was confirmed with ultrasound image documentation. Needle was exchanged over a 018 guidewire for transitional dilator which allowed passage of the [facility] Hospital[facility] into the IVC. Over this, the transitional dilator was exchanged for a 5 [place] catheter. A small incision was made on the right  anterior chest wall and a subcutaneous pocket fashioned. The power-injectable port was positioned  and its catheter tunneled to the right IJ dermatotomy site. The MPA catheter was exchanged over an Amplatz wire for a peel-away sheath, through which the port catheter, which had been trimmed to the appropriate length, was advanced and positioned under fluoroscopy with its tip at the cavoatrial junction. Spot chest radiograph confirms good catheter position and no pneumothorax. The pocket was closed with deep interrupted and subcuticular continuous 3-0 Monocryl sutures. The port was flushed per protocol. The incisions were covered with Dermabond then covered with a sterile dressing. COMPLICATIONS: COMPLICATIONS None immediate IMPRESSION: Technically successful right IJ power-injectable port catheter placement. Ready for routine use. Electronically Signed   By: Lucrezia Europe M.D.   On: 09/11/2017 08:06   Ir Fluoro Guide Port Insertion Right  Result Date: 09/11/2017 CLINICAL DATA:  Endometrial carcinoma, needs durable venous access for chemotherapy regimen. EXAM: TUNNELED PORT CATHETER PLACEMENT WITH ULTRASOUND AND FLUOROSCOPIC GUIDANCE FLUOROSCOPY TIME:  0.1 minutes; 7.15 uGym2 DAP ANESTHESIA/SEDATION: Intravenous Fentanyl and Versed were administered as conscious sedation during continuous monitoring of the patient's level of consciousness and physiological / cardiorespiratory status by the radiology RN, with a total moderate sedation time of 13 minutes. TECHNIQUE: The procedure, risks, benefits, and alternatives were explained to the patient. Questions regarding the procedure were encouraged and answered. The patient understands and consents to the procedure. As antibiotic prophylaxis, cefazolin 2 g was ordered pre-procedure and administered intravenously within one hour of incision. Patency of the right IJ vein was confirmed with ultrasound with image documentation. An appropriate skin site was determined. Skin site  was marked. Region was prepped using maximum barrier technique including cap and mask, sterile gown, sterile gloves, large sterile sheet, and Chlorhexidine as cutaneous antisepsis. The region was infiltrated locally with 1% lidocaine. Under real-time ultrasound guidance, the right IJ vein was accessed with a 21 gauge micropuncture needle; the needle tip within the vein was confirmed with ultrasound image documentation. Needle was exchanged over a 018 guidewire for transitional dilator which allowed passage of the [facility] wire into the IVC. Over this, the transitional dilator was exchanged for a 5 [place] MPA catheter. A small incision was made on the right anterior chest wall and a subcutaneous pocket fashioned. The power-injectable port was positioned and its catheter tunneled to the right IJ dermatotomy site. The MPA catheter was exchanged over an Amplatz wire for a peel-away sheath, through which the port catheter, which had been trimmed to the appropriate length, was advanced and positioned under fluoroscopy with its tip at the cavoatrial junction. Spot chest radiograph confirms good catheter position and no pneumothorax. The pocket was closed with deep interrupted and subcuticular continuous 3-0 Monocryl sutures. The port was flushed per protocol. The incisions were covered with Dermabond then covered with a sterile dressing. COMPLICATIONS: COMPLICATIONS None immediate IMPRESSION: Technically successful right IJ power-injectable port catheter placement. Ready for routine use. Electronically Signed   By: Lucrezia Europe M.D.   On: 09/11/2017 08:06    All questions were answered. The patient knows to call the clinic with any problems, questions or concerns. No barriers to learning was detected.  I spent 25 minutes counseling the patient face to face. The total time spent in the appointment was 30 minutes and more than 50% was on counseling and review of test results  Heath Lark, MD 09/18/2017 1:43 PM

## 2017-09-18 NOTE — Patient Instructions (Signed)

## 2017-09-18 NOTE — Assessment & Plan Note (Signed)
I counted 3 liver lesions on recent CT The patient is not symptomatic Liver function is stable For this reason, she would not need dose adjustment for chemotherapy

## 2017-09-18 NOTE — Assessment & Plan Note (Signed)
She has multifactorial anemia, likely anemia chronic disease and possible component of iron deficiency Iron studies are pending The patient has poor diet due to reduced appetite and nausea We discussed iron rich diet If she  is unable to tolerate oral iron supplements, I would plan to give her intravenous iron infusion

## 2017-09-18 NOTE — Assessment & Plan Note (Signed)
I have reviewed the current guidelines with the patient We discussed the risk, benefits, side effects of Doxil and she agreed to proceed Baseline echocardiogram is within normal limits I plan to repeat imaging study after 3 cycles, for maximum dose of 6-8 cycles. On further review of molecular testing from her tissue biopsy from 2017, it was noted that she was MSI high based on foundation one study She would qualify for immunotherapy in the future

## 2017-09-18 NOTE — Telephone Encounter (Signed)
Gave patient AVs and calendar of upcoming march and April appointments.  °

## 2017-09-18 NOTE — Patient Instructions (Signed)
[facility] Discharge Instructions for Patients Receiving Chemotherapy  Today you received the following chemotherapy agents Doxil  To help prevent nausea and vomiting after your treatment, we encourage you to take your nausea medication as directed   If you develop nausea and vomiting that is not controlled by your nausea medication, call the clinic.   BELOW ARE SYMPTOMS THAT SHOULD BE REPORTED IMMEDIATELY:  *FEVER GREATER THAN 100.5 F  *CHILLS WITH OR WITHOUT FEVER  NAUSEA AND VOMITING THAT IS NOT CONTROLLED WITH YOUR NAUSEA MEDICATION  *UNUSUAL SHORTNESS OF BREATH  *UNUSUAL BRUISING OR BLEEDING  TENDERNESS IN MOUTH AND THROAT WITH OR WITHOUT PRESENCE OF ULCERS  *URINARY PROBLEMS  *BOWEL PROBLEMS  UNUSUAL RASH Items with * indicate a potential emergency and should be followed up as soon as possible.  Feel free to call the clinic should you have any questions or concerns. The clinic phone number is [phone].  Please show the [place] at check-in to the Emergency Department and triage nurse.   Doxorubicin Liposomal (Doxil) injection What is this medicine? LIPOSOMAL DOXORUBICIN (LIP oh som al dox oh ROO bi sin) is a chemotherapy drug. This medicine is used to treat many kinds of cancer like Kaposi's sarcoma, multiple myeloma, and ovarian cancer. This medicine may be used for other purposes; ask your health care provider or pharmacist if you have questions. COMMON BRAND NAME(S): Doxil, Lipodox What should I tell my health care provider before I take this medicine? They need to know if you have any of these conditions: -blood disorders -heart disease -infection (especially a virus infection such as chickenpox, cold sores, or herpes) -liver disease -recent or ongoing radiation therapy -an unusual or allergic reaction to doxorubicin, other chemotherapy agents, soybeans, other medicines, foods, dyes, or preservatives -pregnant or trying to get  pregnant -breast-feeding How should I use this medicine? This drug is given as an infusion into a vein. It is administered in a hospital or clinic by a specially trained health care professional. If you have pain, swelling, burning or any unusual feeling around the site of your injection, tell your health care professional right away. Talk to your pediatrician regarding the use of this medicine in children. Special care may be needed. Overdosage: If you think you have taken too much of this medicine contact a poison control center or emergency room at once. NOTE: This medicine is only for you. Do not share this medicine with others. What if I miss a dose? It is important not to miss your dose. Call your doctor or health care professional if you are unable to keep an appointment. What may interact with this medicine? Do not take this medicine with any of the following medications: -zidovudine This medicine may also interact with the following medications: -medicines to increase blood counts like filgrastim, pegfilgrastim, sargramostim -vaccines Talk to your doctor or health care professional before taking any of these medicines: -acetaminophen -aspirin -ibuprofen -ketoprofen -naproxen This list may not describe all possible interactions. Give your health care provider a list of all the medicines, herbs, non-prescription drugs, or dietary supplements you use. Also tell them if you smoke, drink alcohol, or use illegal drugs. Some items may interact with your medicine. What should I watch for while using this medicine? Your condition will be monitored carefully while you are receiving this medicine. You will need important blood work done while you are taking this medicine. This drug may make you feel generally unwell. This is not uncommon, as  chemotherapy can affect healthy cells as well as cancer cells. Report any side effects. Continue your course of treatment even though you feel ill unless  your doctor tells you to stop. Your urine may turn orange-red for a few days after your dose. This is not blood. If your urine is dark or brown, call your doctor. In some cases, you may be given additional medicines to help with side effects. Follow all directions for their use. Call your doctor or health care professional for advice if you get a fever (100.5 degrees F or higher), chills or sore throat, or other symptoms of a cold or flu. Do not treat yourself. This drug decreases your body's ability to fight infections. Try to avoid being around people who are sick. This medicine may increase your risk to bruise or bleed. Call your doctor or health care professional if you notice any unusual bleeding. Be careful brushing and flossing your teeth or using a toothpick because you may get an infection or bleed more easily. If you have any dental work done, tell your dentist you are receiving this medicine. Avoid taking products that contain aspirin, acetaminophen, ibuprofen, naproxen, or ketoprofen unless instructed by your doctor. These medicines may hide a fever. Men and women of childbearing age should use effective birth control methods while using taking this medicine. Do not become pregnant while taking this medicine. There is a potential for serious side effects to an unborn child. Talk to your health care professional or pharmacist for more information. Do not breast-feed an infant while taking this medicine. Talk to your doctor about your risk of cancer. You may be more at risk for certain types of cancers if you take this medicine. What side effects may I notice from receiving this medicine? Side effects that you should report to your doctor or health care professional as soon as possible: -allergic reactions like skin rash, itching or hives, swelling of the face, lips, or tongue -low blood counts - this medicine may decrease the number of white blood cells, red blood cells and platelets. You may  be at increased risk for infections and bleeding. -signs of hand-foot syndrome - tingling or burning, redness, flaking, swelling, small blisters, or small sores on the palms of your hands or the soles of your feet -signs of infection - fever or chills, cough, sore throat, pain or difficulty passing urine -signs of decreased platelets or bleeding - bruising, pinpoint red spots on the skin, black, tarry stools, blood in the urine -signs of decreased red blood cells - unusually weak or tired, fainting spells, lightheadedness -back pain, chills, facial flushing, fever, headache, tightness in the chest or throat during the infusion -breathing problems -chest pain -fast, irregular heartbeat -mouth pain, redness, sores -pain, swelling, redness at site where injected -pain, tingling, numbness in the hands or feet -swelling of ankles, feet, or hands -vomiting Side effects that usually do not require medical attention (report to your doctor or health care professional if they continue or are bothersome): -diarrhea -hair loss -loss of appetite -nail discoloration or damage -nausea -red or watery eyes -red colored urine -stomach upset This list may not describe all possible side effects. Call your doctor for medical advice about side effects. You may report side effects to FDA at 1-800-FDA-1088. Where should I keep my medicine? This drug is given in a hospital or clinic and will not be stored at home. NOTE: This sheet is a summary. It may not cover all possible information. If you  have questions about this medicine, talk to your doctor, pharmacist, or health care provider.  2018 Elsevier/Gold Standard (2012-03-13 10:12:56)

## 2017-09-19 ENCOUNTER — Other Ambulatory Visit: Payer: Self-pay | Admitting: Hematology and Oncology

## 2017-09-19 ENCOUNTER — Telehealth: Payer: Self-pay

## 2017-09-19 NOTE — Telephone Encounter (Signed)
Called with below message. Verbalized understanding. 

## 2017-09-19 NOTE — Telephone Encounter (Signed)
-----   Message from Heath Lark, MD sent at 09/19/2017  8:46 AM EDT ----- Regarding: labs Iron studies suggest iron def anemia I recommend over the counter iron 1 daily at night If her labs is still abnormal, we will add IV iron next visit ----- Message ----- From: Interface, Lab In [facility] Sent: 09/18/2017  12:10 PM To: Heath Lark, MD

## 2017-09-29 ENCOUNTER — Other Ambulatory Visit: Payer: Self-pay | Admitting: *Deleted

## 2017-09-29 MED ORDER — TRAZODONE HCL 50 MG PO TABS: 75.0000 mg | ORAL_TABLET | Freq: Every day | ORAL | 1 refills | Status: DC

## 2017-10-03 ENCOUNTER — Ambulatory Visit: Payer: Federal, State, Local not specified - PPO

## 2017-10-03 ENCOUNTER — Other Ambulatory Visit: Payer: Federal, State, Local not specified - PPO

## 2017-10-03 ENCOUNTER — Ambulatory Visit: Payer: Federal, State, Local not specified - PPO | Admitting: Hematology and Oncology

## 2017-10-09 ENCOUNTER — Other Ambulatory Visit: Payer: Self-pay | Admitting: Family Medicine

## 2017-10-09 ENCOUNTER — Ambulatory Visit
Admission: RE | Admit: 2017-10-09 | Discharge: 2017-10-09 | Disposition: A | Discharge status: Home / Self Care | Payer: Medicare Other | Source: Ambulatory Visit | Attending: Family Medicine | Admitting: Family Medicine

## 2017-10-09 DIAGNOSIS — R0781 Pleurodynia: Secondary | ICD-10-CM

## 2017-10-16 ENCOUNTER — Inpatient Hospital Stay: Payer: Medicare Other

## 2017-10-16 ENCOUNTER — Inpatient Hospital Stay: Payer: Medicare Other | Attending: Hematology and Oncology

## 2017-10-16 ENCOUNTER — Inpatient Hospital Stay (HOSPITAL_BASED_OUTPATIENT_CLINIC_OR_DEPARTMENT_OTHER): Payer: Medicare Other | Admitting: Hematology and Oncology

## 2017-10-16 ENCOUNTER — Encounter: Payer: Self-pay | Admitting: Hematology and Oncology

## 2017-10-16 DIAGNOSIS — C787 Secondary malignant neoplasm of liver and intrahepatic bile duct: Secondary | ICD-10-CM | POA: Insufficient documentation

## 2017-10-16 DIAGNOSIS — Z79899 Other long term (current) drug therapy: Secondary | ICD-10-CM | POA: Diagnosis not present

## 2017-10-16 DIAGNOSIS — C541 Malignant neoplasm of endometrium: Secondary | ICD-10-CM

## 2017-10-16 DIAGNOSIS — Z5111 Encounter for antineoplastic chemotherapy: Secondary | ICD-10-CM | POA: Diagnosis present

## 2017-10-16 DIAGNOSIS — D6481 Anemia due to antineoplastic chemotherapy: Secondary | ICD-10-CM

## 2017-10-16 DIAGNOSIS — R748 Abnormal levels of other serum enzymes: Secondary | ICD-10-CM

## 2017-10-16 DIAGNOSIS — T451X5A Adverse effect of antineoplastic and immunosuppressive drugs, initial encounter: Secondary | ICD-10-CM

## 2017-10-16 LAB — CMP (CANCER CENTER ONLY)
ALBUMIN: 2.9 g/dL — AB (ref 3.5–5.0)
ALK PHOS: 221 U/L — AB (ref 40–150)
ALT: 24 U/L (ref 0–55)
AST: 22 U/L (ref 5–34)
Anion gap: 10 (ref 3–11)
BUN: 15 mg/dL (ref 7–26)
CALCIUM: 10 mg/dL (ref 8.4–10.4)
CO2: 26 mmol/L (ref 22–29)
CREATININE: 0.81 mg/dL (ref 0.60–1.10)
Chloride: 103 mmol/L (ref 98–109)
GFR, Est AFR Am: >60 mL/min (ref >60)
GFR, Estimated: >60 mL/min (ref >60)
Glucose, Bld: 91 mg/dL (ref 70–140)
Potassium: 4.1 mmol/L (ref 3.5–5.1)
SODIUM: 139 mmol/L (ref 136–145)
Total Bilirubin: 0.3 mg/dL (ref 0.2–1.2)
Total Protein: 6.9 g/dL (ref 6.4–8.3)

## 2017-10-16 LAB — CBC WITH DIFFERENTIAL (CANCER CENTER ONLY)
BASOS PCT: 0 %
Basophils Absolute: 0 10*3/uL (ref 0.0–0.1)
EOS ABS: 0.1 10*3/uL (ref 0.0–0.5)
Eosinophils Relative: 1 %
HEMATOCRIT: 30.3 % — AB (ref 34.8–46.6)
Hemoglobin: 9.5 g/dL — ABNORMAL LOW (ref 11.6–15.9)
Lymphocytes Relative: 15 %
Lymphs Abs: 1.1 10*3/uL (ref 0.9–3.3)
MCH: 25.3 pg (ref 25.1–34.0)
MCHC: 31.4 g/dL — AB (ref 31.5–36.0)
MCV: 80.6 fL (ref 79.5–101.0)
MONO ABS: 0.9 10*3/uL (ref 0.1–0.9)
MONOS PCT: 12 %
Neutro Abs: 5.1 10*3/uL (ref 1.5–6.5)
Neutrophils Relative %: 72 %
Platelet Count: 322 10*3/uL (ref 145–400)
RBC: 3.76 MIL/uL (ref 3.70–5.45)
RDW: 18.8 % — AB (ref 11.2–14.5)
WBC Count: 7.2 10*3/uL (ref 3.9–10.3)

## 2017-10-16 MED ORDER — DEXAMETHASONE SODIUM PHOSPHATE 10 MG/ML IJ SOLN
10.0000 mg | Freq: Once | INTRAMUSCULAR | Status: AC
  Administered 2017-10-16: 10 mg via INTRAVENOUS

## 2017-10-16 MED ORDER — HEPARIN SOD (PORK) LOCK FLUSH 100 UNIT/ML IV SOLN
500.0000 [IU] | Freq: Once | INTRAVENOUS | Status: AC | PRN
  Administered 2017-10-16: 500 [IU]
  Filled 2017-10-16: qty 5

## 2017-10-16 MED ORDER — DEXTROSE 5 % IV SOLN
38.5000 mg/m2 | Freq: Once | INTRAVENOUS | Status: AC
  Administered 2017-10-16: 80 mg via INTRAVENOUS
  Filled 2017-10-16: qty 40

## 2017-10-16 MED ORDER — SODIUM CHLORIDE 0.9% FLUSH
10.0000 mL | INTRAVENOUS | Status: DC | PRN
  Administered 2017-10-16: 10 mL
  Filled 2017-10-16: qty 10

## 2017-10-16 MED ORDER — DEXTROSE 5 % IV SOLN
Freq: Once | INTRAVENOUS | Status: AC
  Administered 2017-10-16: 15:00:00 via INTRAVENOUS

## 2017-10-16 MED ORDER — SODIUM CHLORIDE 0.9% FLUSH
10.0000 mL | Freq: Once | INTRAVENOUS | Status: AC
  Administered 2017-10-16: 10 mL
  Filled 2017-10-16: qty 10

## 2017-10-16 MED ORDER — DEXAMETHASONE SODIUM PHOSPHATE 10 MG/ML IJ SOLN
INTRAMUSCULAR | Status: AC
  Filled 2017-10-16: qty 1

## 2017-10-16 MED ORDER — SODIUM CHLORIDE 0.9 % IV SOLN: 510.0000 mg | Freq: Once | INTRAVENOUS | Status: DC

## 2017-10-16 NOTE — Patient Instructions (Signed)
[facility] Discharge Instructions for Patients Receiving Chemotherapy  Today you received the following chemotherapy agents:  Doxil (liposomal doxorubicin)  To help prevent nausea and vomiting after your treatment, we encourage you to take your nausea medication as prescribed.   If you develop nausea and vomiting that is not controlled by your nausea medication, call the clinic.   BELOW ARE SYMPTOMS THAT SHOULD BE REPORTED IMMEDIATELY:  *FEVER GREATER THAN 100.5 F  *CHILLS WITH OR WITHOUT FEVER  NAUSEA AND VOMITING THAT IS NOT CONTROLLED WITH YOUR NAUSEA MEDICATION  *UNUSUAL SHORTNESS OF BREATH  *UNUSUAL BRUISING OR BLEEDING  TENDERNESS IN MOUTH AND THROAT WITH OR WITHOUT PRESENCE OF ULCERS  *URINARY PROBLEMS  *BOWEL PROBLEMS  UNUSUAL RASH Items with * indicate a potential emergency and should be followed up as soon as possible.  Feel free to call the clinic should you have any questions or concerns. The clinic phone number is [phone].  Please show the [place] at check-in to the Emergency Department and triage nurse.

## 2017-10-16 NOTE — Assessment & Plan Note (Signed)
She tolerated chemotherapy well The right upper quadrant pain is unrelated to side effects of treatment We will proceed with treatment without dose adjustment I plan to repeat imaging study after 3 cycles, for maximum dose of 6-8 cycles. On further review of molecular testing from her tissue biopsy from 2017, it was noted that she was MSI high based on foundation one study She would qualify for immunotherapy in the future

## 2017-10-16 NOTE — Progress Notes (Signed)
Per Dr Alvy Bimler, pt does not need an iron infusion today.

## 2017-10-16 NOTE — Assessment & Plan Note (Signed)
She has multifactorial anemia, likely combination of anemia chronic disease and mild iron deficiency Anemia is stable/improving Recommend she continues dietary modification and iron rich diet

## 2017-10-16 NOTE — Progress Notes (Signed)
[facility] OFFICE PROGRESS NOTE  Patient Care Team: [name], MD as PCP - General (Family Medicine)  ASSESSMENT & PLAN:  Recurrent carcinoma of endometrium [facility]) She tolerated chemotherapy well The right upper quadrant pain is unrelated to side effects of treatment We will proceed with treatment without dose adjustment I plan to repeat imaging study after 3 cycles, for maximum dose of 6-8 cycles. On further review of molecular testing from her tissue biopsy from 2017, it was noted that she was MSI high based on foundation one study She would qualify for immunotherapy in the future  Metastasis to liver [facility]) Liver enzymes are stable except for elevated alkaline phosphatase She is not symptomatic Observe only.  Anemia due to antineoplastic chemotherapy She has multifactorial anemia, likely combination of anemia chronic disease and mild iron deficiency Anemia is stable/improving Recommend she continues dietary modification and iron rich diet   No orders of the defined types were placed in this encounter.   INTERVAL HISTORY: Please see below for problem oriented charting. She returns for cycle 2 of chemotherapy She had recent rib pain, resolved with pain medicine She also have one episode of bronchitis, resolved with antibiotics She had no recent nausea, vomiting or changes in bowel habits Denies vaginal bleeding.  SUMMARY OF ONCOLOGIC HISTORY: Oncology History   MSI stable on June 2017 tissue but high from Foundation One study from November 2017  05/15/16: ER is moderately positive (70%). PR is strongly positive (80%).     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 Lymph node, sentinel, biopsy, right obturator ONE BENIGN LYMPH NODE (0/1) 3. Lymph nodes, regional resection, left pelvic FOUR BENIGN LYMPH NODES (0/4)      01/02/2016 Surgery    Dr. Denman George performed robotic-assisted laparoscopic total hysterectomy with bilateral salpingoophorectomy, sentinel lymph node biopsy, lymphadenectomy        05/15/2016 Pathology Results    Vagina, biopsy, mid - ADENOCARCINOMA, SEE COMMENT. Microscopic Comment The morphology along with the patient's history are consistent with recurrent endometrioid adenocarcinoma. The carcinoma has a similar appearance to the primary ([id number]).      05/20/2016 Imaging    Ct scan abdomen showed solid 2.5 cm peritoneal mass in the mid to anterior left pelvis, suspicious for peritoneal metastasis. 2. Small expansile low-attenuation filling defect in the left external iliac vein, cannot exclude a small deep venous thrombus. Consider correlation with left lower extremity venous Doppler scan. 3. Small simple fluid density structure in the left pelvic sidewall abutting the left external iliac vessels, favor a small postoperative seroma. 4. No ascites. 5. No lymphadenopathy.  No metastatic disease in the chest. 6. Aortic atherosclerosis.      06/12/2016 PET scan    Intensely hypermetabolic 2.1 cm central pelvic peritoneal mass just to the left of midline, consistent with peritoneal metastatic recurrence. No ascites. 2. No additional hypermetabolic sites of metastatic disease. 3. Diffuse thyroid hypermetabolism without discrete thyroid nodule, favoring thyroiditis. Recommend correlation with serum thyroid function tests.      06/24/2016 - 07/22/2016 Chemotherapy    The patient had weekly cisplatin. She has missed several doses due to infection and pancytopenia      06/24/2016 - 09/04/2016 Radiation Therapy  She completed concurrent radiation therapy Radiation treatment dates:   IMRT :  06/24/16 - 08/01/16 HDR : 08/13/16, 08/20/16, 08/27/16, 09/04/16  Site/dose:   Pelvis treated to 55 Gy in 25 fractions (simultaneous integrated boost technique) Vaginal Cuff treated to 24 Gy in 4 fractions      08/15/2016 - 12/03/2016 Chemotherapy    She received 6 cycles of carboplatin/Taxol      09/27/2016 Imaging    Interval decrease in size of previously described solid peritoneal nodule within the left anterior pelvis. Near complete resolution of previously described low-attenuation structure along the left pelvic sidewall. No evidence for metastatic disease in the chest. Aortic atherosclerosis.      12/30/2016 Imaging    Ct abdomen 1. Solitary left pelvic peritoneal implant is mildly decreased in size in the interval. 2. No new or progressive metastatic disease in the abdomen or pelvis. No ascites. 3. Aortic atherosclerosis.      04/02/2017 Imaging    Left lower quadrant peritoneal implant referenced on previous exam measures 1.7 x 1.3 cm, image 69 of series 2. Increased from 0.8 x 0.8 cm previously peer no new peritoneal implants identified.  Musculoskeletal: The degenerative disc disease noted within the lumbar spine.  IMPRESSION: 1. Solitary left pelvic peritoneal implant is increased in size in the interval. 2. No new sites of disease.  No ascites. 3. Aortic atherosclerosis      04/11/2017 PET scan    1. The left side of pelvis peritoneal implant has decreased in size and degree of FDG uptake compatible with response to therapy. No new areas of peritoneal disease identified. 2. Persistent diffuse increased uptake within the thyroid gland. Correlation with patient's thyroid function may be helpful.      05/26/2017 Imaging    1. Enlarging tumor implant along the left adnexa, currently 2.6 by 2.4 cm and previously 1.9 by 1.3 cm. No new tumor implant or other specific cause for the patient's pelvic symptoms is currently identified. 2.  Aortic Atherosclerosis  (ICD10-I70.0). 3. Lumbar spondylosis and degenerative disc disease causing multilevel impingement.      06/04/2017 - 08/28/2017 Chemotherapy    The patient started letrozole and everolimus      08/26/2017 Imaging    Increased size of mass in the left adnexal region.  Several new small liver metastases in right hepatic lobe      09/08/2017 Imaging    LV EF: 60% -  65%      09/10/2017 Procedure    Technically successful right IJ power-injectable port catheter placement. Ready for routine use       REVIEW OF SYSTEMS:   Constitutional: Denies fevers, chills or abnormal weight loss Eyes: Denies blurriness of vision Ears, nose, mouth, throat, and face: Denies mucositis or sore throat Respiratory: Denies cough, dyspnea or wheezes Cardiovascular: Denies palpitation, chest discomfort or lower extremity swelling Gastrointestinal:  Denies nausea, heartburn or change in bowel habits Skin: Denies abnormal skin rashes Lymphatics: Denies new lymphadenopathy or easy bruising Neurological:Denies numbness, tingling or new weaknesses Behavioral/Psych: Mood is stable, no new changes  All other systems were reviewed with the patient and are negative.  I have reviewed the past medical history, past surgical history, social history and family history with the patient and they are unchanged from previous note.  ALLERGIES:  is allergic to latex.  MEDICATIONS:  Current Outpatient Medications  Medication Sig Dispense Refill  . acetaminophen (TYLENOL) 325 MG tablet Take 650 mg by mouth every 6 (six) hours as needed.    Marland Kitchen  ALPRAZolam (XANAX) 1 MG tablet Take one tablet at bedtime as needed for insomnia and restless legs.    Marland Kitchen amoxicillin (AMOXIL) 500 MG capsule Take 2,000 mg by mouth See admin instructions. Prior to dental appointment, takes 4 tables prior to appt    . Biotin w/ Vitamins C & E (HAIR/SKIN/NAILS PO) Take 1 tablet by mouth daily.    . Calcium Carb-Cholecalciferol (CALCIUM 600 + D PO) Take  2 tablets by mouth daily.    . chlorhexidine (PERIDEX) 0.12 % solution Use as directed 15 mLs in the mouth or throat 2 (two) times daily. (Patient not taking: Reported on 07/07/2017) 120 mL 0  . clonazePAM (KLONOPIN) 0.5 MG tablet Take 1 tablet (0.5 mg total) by mouth daily as needed (restless legs). (Patient taking differently: Take 0.5 mg by mouth at bedtime as needed (restless legs). ) 60 tablet 1  . fluticasone (FLONASE) 50 MCG/ACT nasal spray Place 1 spray into both nostrils 2 (two) times daily as needed for allergies.   1  . gabapentin (NEURONTIN) 600 MG tablet TAKE 1 TABLET BY MOUTH TWICE A DAY (Patient taking differently: Take 1 tablet 2 times a day) 60 tablet 9  . HYDROmorphone (DILAUDID) 4 MG tablet Take 1 tablet (4 mg total) by mouth every 4 (four) hours as needed for severe pain. 60 tablet 0  . ibuprofen (ADVIL,MOTRIN) 200 MG tablet Take 400 mg by mouth every 8 (eight) hours as needed for mild pain.    Marland Kitchen levothyroxine (SYNTHROID, LEVOTHROID) 88 MCG tablet Take 88 mcg by mouth daily.  3  . lidocaine-prilocaine (EMLA) cream Apply to affected area once 30 g 3  . loratadine (CLARITIN) 10 MG tablet Take 10 mg by mouth daily.    Marland Kitchen LORazepam (ATIVAN) 1 MG tablet Take 1 tablet (1 mg total) by mouth at bedtime as needed for sleep. 30 tablet 0  . losartan-hydrochlorothiazide (HYZAAR) 100-12.5 MG tablet Take 1/2 tablet daily for hypertension.    . Magnesium 250 MG TABS Take 250 mg by mouth daily.    . Melatonin 10 MG CAPS Take 10 mg by mouth at bedtime.    . ondansetron (ZOFRAN) 8 MG tablet Take 1 tablet (8 mg total) by mouth every 8 (eight) hours as needed for nausea. 60 tablet 1  . potassium gluconate (HM POTASSIUM) 595 (99 K) MG TABS tablet Take 595 mg by mouth daily.    . prochlorperazine (COMPAZINE) 10 MG tablet Take 1 tablet (10 mg total) by mouth every 6 (six) hours as needed (Nausea or vomiting). 30 tablet 1  . traZODone (DESYREL) 50 MG tablet Take 1.5 tablets (75 mg total) by mouth at  bedtime. 135 tablet 1   No current facility-administered medications for this visit.     PHYSICAL EXAMINATION: ECOG PERFORMANCE STATUS: 1 - Symptomatic but completely ambulatory  Vitals:   10/16/17 1236  BP: 134/74  Pulse: 93  Resp: 17  Temp: 99 F (37.2 C)  SpO2: 98%   Filed Weights   10/16/17 1236  Weight: 202 lb (91.6 kg)    GENERAL:alert, no distress and comfortable SKIN: skin color, texture, turgor are normal, no rashes or significant lesions EYES: normal, Conjunctiva are pink and non-injected, sclera clear OROPHARYNX:no exudate, no erythema and lips, buccal mucosa, and tongue normal  NECK: supple, thyroid normal size, non-tender, without nodularity LYMPH:  no palpable lymphadenopathy in the cervical, axillary or inguinal LUNGS: clear to auscultation and percussion with normal breathing effort HEART: regular rate & rhythm and no murmurs and  no lower extremity edema ABDOMEN:abdomen soft, non-tender and normal bowel sounds Musculoskeletal:no cyanosis of digits and no clubbing  NEURO: alert & oriented x 3 with fluent speech, no focal motor/sensory deficits  LABORATORY DATA:  I have reviewed the data as listed    Component Value Date/Time   NA 139 10/16/2017 1209   NA 141 07/09/2017 1312   K 4.1 10/16/2017 1209   K 3.9 07/09/2017 1312   CL 103 10/16/2017 1209   CO2 26 10/16/2017 1209   CO2 27 07/09/2017 1312   GLUCOSE 91 10/16/2017 1209   GLUCOSE 109 07/09/2017 1312   BUN 15 10/16/2017 1209   BUN 16.4 07/09/2017 1312   CREATININE 0.81 10/16/2017 1209   CREATININE 0.8 07/09/2017 1312   CALCIUM 10.0 10/16/2017 1209   CALCIUM 9.2 07/09/2017 1312   PROT 6.9 10/16/2017 1209   PROT 7.1 07/09/2017 1312   ALBUMIN 2.9 (L) 10/16/2017 1209   ALBUMIN 3.1 (L) 07/09/2017 1312   AST 22 10/16/2017 1209   AST 35 (H) 07/09/2017 1312   ALT 24 10/16/2017 1209   ALT 43 07/09/2017 1312   ALKPHOS 221 (H) 10/16/2017 1209   ALKPHOS 182 (H) 07/09/2017 1312   BILITOT 0.3  10/16/2017 1209   BILITOT 0.34 07/09/2017 1312   GFRNONAA >60 10/16/2017 1209   GFRAA >60 10/16/2017 1209    No results found for: SPEP, UPEP  Lab Results  Component Value Date   WBC 7.2 10/16/2017   NEUTROABS 5.1 10/16/2017   HGB 9.5 (L) 09/10/2017   HCT 30.3 (L) 10/16/2017   MCV 80.6 10/16/2017   PLT 322 10/16/2017      Chemistry      Component Value Date/Time   NA 139 10/16/2017 1209   NA 141 07/09/2017 1312   K 4.1 10/16/2017 1209   K 3.9 07/09/2017 1312   CL 103 10/16/2017 1209   CO2 26 10/16/2017 1209   CO2 27 07/09/2017 1312   BUN 15 10/16/2017 1209   BUN 16.4 07/09/2017 1312   CREATININE 0.81 10/16/2017 1209   CREATININE 0.8 07/09/2017 1312      Component Value Date/Time   CALCIUM 10.0 10/16/2017 1209   CALCIUM 9.2 07/09/2017 1312   ALKPHOS 221 (H) 10/16/2017 1209   ALKPHOS 182 (H) 07/09/2017 1312   AST 22 10/16/2017 1209   AST 35 (H) 07/09/2017 1312   ALT 24 10/16/2017 1209   ALT 43 07/09/2017 1312   BILITOT 0.3 10/16/2017 1209   BILITOT 0.34 07/09/2017 1312       RADIOGRAPHIC STUDIES: I have personally reviewed the radiological images as listed and agreed with the findings in the report. Dg Chest 2 View  Result Date: 10/10/2017 CLINICAL DATA:  Right-sided chest pain for 1 week EXAM: CHEST - 2 VIEW COMPARISON:  None. FINDINGS: There is a right-sided Port-A-Cath in satisfactory position. There is no focal parenchymal opacity. There is no pleural effusion or pneumothorax. The heart and mediastinal contours are unremarkable. The osseous structures are unremarkable. IMPRESSION: No active cardiopulmonary disease. Electronically Signed   By: Kathreen Devoid   On: 10/10/2017 08:06    All questions were answered. The patient knows to call the clinic with any problems, questions or concerns. No barriers to learning was detected.  I spent 15 minutes counseling the patient face to face. The total time spent in the appointment was 20 minutes and more than 50% was on  counseling and review of test results  Heath Lark, MD 10/16/2017 1:09 PM

## 2017-10-16 NOTE — Assessment & Plan Note (Signed)
Liver enzymes are stable except for elevated alkaline phosphatase She is not symptomatic Observe only.

## 2017-10-20 ENCOUNTER — Other Ambulatory Visit: Payer: Self-pay

## 2017-10-20 ENCOUNTER — Telehealth: Payer: Self-pay

## 2017-10-20 DIAGNOSIS — C541 Malignant neoplasm of endometrium: Secondary | ICD-10-CM

## 2017-10-20 MED ORDER — HYDROMORPHONE HCL 4 MG PO TABS: 4.0000 mg | ORAL_TABLET | ORAL | 0 refills | Status: DC | PRN

## 2017-10-20 NOTE — Telephone Encounter (Signed)
Called and told Rx ready for pick-up. 

## 2017-10-20 NOTE — Telephone Encounter (Signed)
Called requesting refill on Hydromorphone Rx

## 2017-10-30 ENCOUNTER — Ambulatory Visit
Admission: RE | Admit: 2017-10-30 | Discharge: 2017-10-30 | Disposition: A | Discharge status: Home / Self Care | Payer: Medicare Other | Source: Ambulatory Visit | Attending: Radiation Oncology | Admitting: Radiation Oncology

## 2017-10-30 ENCOUNTER — Other Ambulatory Visit: Payer: Self-pay

## 2017-10-30 ENCOUNTER — Encounter: Payer: Self-pay | Admitting: Radiation Oncology

## 2017-10-30 VITALS — BP 136/59 | HR 100 | Temp 98.2°F | Resp 20 | Wt 203.2 lb

## 2017-10-30 DIAGNOSIS — R51 Headache: Secondary | ICD-10-CM | POA: Insufficient documentation

## 2017-10-30 DIAGNOSIS — C541 Malignant neoplasm of endometrium: Secondary | ICD-10-CM | POA: Insufficient documentation

## 2017-10-30 DIAGNOSIS — R630 Anorexia: Secondary | ICD-10-CM | POA: Insufficient documentation

## 2017-10-30 DIAGNOSIS — Z9221 Personal history of antineoplastic chemotherapy: Secondary | ICD-10-CM | POA: Diagnosis not present

## 2017-10-30 DIAGNOSIS — Z79899 Other long term (current) drug therapy: Secondary | ICD-10-CM | POA: Diagnosis not present

## 2017-10-30 NOTE — Progress Notes (Addendum)
Patient denies any pain or fatigue. Denies any vaginal or rectal bleeding. Denies any vaginal discharge. Denies any nausea or vomiting. Patient states that she is not using her dilator.Denies any skin irritation. Vitals:   10/30/17 1648  BP: (!) 136/59  Pulse: 100  Resp: 20  Temp: 98.2 F (36.8 C)  TempSrc: Oral  SpO2: 100%  Weight: 203 lb 4 oz (92.2 kg)

## 2017-10-30 NOTE — Progress Notes (Addendum)
Radiation Oncology         [phone] ________________________________  Name: Michelle Rosario MRN: [id number]  Date: 10/30/2017  DOB: 1945/07/25  Follow-Up Visit Note  CC: [name], MD  Marti Sleigh    ICD-10-CM   1. 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  Pt reports that the reccurrence of her disease was discovered on a CT scan. Pt states that her current chemotherapy treatment gives her severe headaches and nausea.  Patient denies any pain or fatigue, any vaginal or rectal bleeding, any vaginal discharge, any nausea or vomiting and any skin irritation. Patient states that she is not using her dilator. Pt states that her appetite has decreased.  ALLERGIES:  is allergic to latex.  Meds: Current Outpatient Medications  Medication Sig Dispense Refill  . ALPRAZolam (XANAX) 1 MG tablet Take one tablet at bedtime as needed for insomnia and restless legs.    Marland Kitchen amoxicillin (AMOXIL) 500 MG capsule Take 2,000 mg by mouth See admin instructions. Prior to dental appointment, takes 4 tables prior to appt    . benzonatate (TESSALON) 200 MG capsule TAKE ONE CAPSULE 3 TIMES A DAY AS NEEDED FOR COUGH  2  . Biotin w/ Vitamins C & E (HAIR/SKIN/NAILS PO) Take 1 tablet by mouth daily.    . Calcium Carb-Cholecalciferol (CALCIUM 600 + D PO) Take 2 tablets by mouth daily.    . celecoxib (CELEBREX) 200 MG capsule Take 1 capsule twice a day with food as needed for chest wall pain.    . clonazePAM (KLONOPIN) 0.5 MG tablet Take 1 tablet (0.5 mg total) by mouth daily as needed (restless legs). (Patient taking differently: Take 0.5 mg by mouth at bedtime as needed (restless legs). ) 60 tablet 1  . fluticasone (FLONASE) 50 MCG/ACT nasal spray Place 1 spray into both nostrils 2 (two) times daily as needed for allergies.    1  . gabapentin (NEURONTIN) 600 MG tablet TAKE 1 TABLET BY MOUTH TWICE A DAY (Patient taking differently: Take 1 tablet 2 times a day) 60 tablet 9  . guaiFENesin-codeine 100-10 MG/5ML syrup Take 1 or two teaspoons at bedtime as needed for cough. May repeat during the night.    Marland Kitchen HYDROcodone-acetaminophen (NORCO/VICODIN) 5-325 MG tablet Take one tablet at bedtime as needed for pain    . HYDROmorphone (DILAUDID) 4 MG tablet Take 1 tablet (4 mg total) by mouth every 4 (four) hours as needed for severe pain. 60 tablet 0  . levothyroxine (SYNTHROID, LEVOTHROID) 88 MCG tablet Take 88 mcg by mouth daily.  3  . lidocaine-prilocaine (EMLA) cream Apply to affected area once 30 g 3  . loratadine (CLARITIN) 10 MG tablet Take 10 mg by mouth daily.    Marland Kitchen LORazepam (ATIVAN) 1 MG tablet Take 1 tablet (1 mg total) by mouth at bedtime as needed for sleep. 30 tablet 0  . losartan-hydrochlorothiazide (HYZAAR) 100-12.5 MG tablet Take 1/2 tablet daily for hypertension.    . Magnesium 250 MG TABS Take 250 mg by mouth daily.    . Melatonin 10 MG CAPS Take 10 mg by mouth at bedtime.    . ondansetron (ZOFRAN) 8 MG tablet Take 1 tablet (8 mg total) by mouth every 8 (eight) hours as needed for nausea. 60 tablet 1  . prochlorperazine (COMPAZINE) 10 MG tablet  Take 1 tablet (10 mg total) by mouth every 6 (six) hours as needed (Nausea or vomiting). 30 tablet 1  . traZODone (DESYREL) 50 MG tablet Take 1.5 tablets (75 mg total) by mouth at bedtime. 135 tablet 1  . acetaminophen (TYLENOL) 325 MG tablet Take 650 mg by mouth every 6 (six) hours as needed.    . chlorhexidine (PERIDEX) 0.12 % solution Use as directed 15 mLs in the mouth or throat 2 (two) times daily. (Patient not taking: Reported on 07/07/2017) 120 mL 0  . ibuprofen (ADVIL,MOTRIN) 200 MG tablet Take 400 mg by mouth every 8 (eight) hours as needed for mild pain.    . potassium gluconate (HM POTASSIUM) 595 (99 K) MG TABS tablet Take 595 mg by mouth daily.     No current  facility-administered medications for this encounter.     Physical Findings: The patient is in no acute distress. Patient is alert and oriented.  weight is 203 lb 4 oz (92.2 kg). Her oral temperature is 98.2 F (36.8 C). Her blood pressure is 136/59 (abnormal) and her pulse is 100. Her respiration is 20 and oxygen saturation is 100%. . Lungs are clear to auscultation bilaterally. Heart has regular rate and rhythm. No palpable cervical, supraclavicular, or axillary adenopathy. Abdomen soft, non-tender, normal bowel sounds. Pelvic exam not performed in light of liver recurrence    Lab Findings: Lab Results  Component Value Date   WBC 7.2 10/16/2017   HGB 9.5 (L) 10/16/2017   HCT 30.3 (L) 10/16/2017   MCV 80.6 10/16/2017   PLT 322 10/16/2017    Radiographic Findings: Dg Chest 2 View  Result Date: 10/10/2017 CLINICAL DATA:  Right-sided chest pain for 1 week EXAM: CHEST - 2 VIEW COMPARISON:  None. FINDINGS: There is a right-sided Port-A-Cath in satisfactory position. There is no focal parenchymal opacity. There is no pleural effusion or pneumothorax. The heart and mediastinal contours are unremarkable. The osseous structures are unremarkable. IMPRESSION: No active cardiopulmonary disease. Electronically Signed   By: Kathreen Devoid   On: 10/10/2017 08:06    Impression:  Recurrent carcinoma of endometrium. Unfortunately the patient has developed an additional recurrence after her external beam and brachytherapy treatments.  The pt is on salvage chemotherapy at this time Adriamycin. She will undergo additional scanning after chemotherapy is complete.  Patient may be a candidate for immunotherapy in the future if current course of chemotherapy is not beneficial.  Plan:  PRN radiation therapy.  -----------------------------------  Blair Promise, PhD, MD  This document serves as a record of services personally performed by Gery Pray, MD. It was created on his behalf by Valeta Harms, a  trained medical scribe. The creation of this record is based on the scribe's personal observations and the provider's statements to them. This document has been checked and approved by the attending provider.

## 2017-10-31 NOTE — Addendum Note (Signed)
Encounter addended by: Sherrlyn Hock, LPN on: 2/57/4935 5:21 PM  Actions taken: Charge Capture section accepted

## 2017-11-13 ENCOUNTER — Inpatient Hospital Stay (HOSPITAL_BASED_OUTPATIENT_CLINIC_OR_DEPARTMENT_OTHER): Payer: Medicare Other | Admitting: Hematology and Oncology

## 2017-11-13 ENCOUNTER — Inpatient Hospital Stay: Payer: Medicare Other | Attending: Hematology and Oncology

## 2017-11-13 ENCOUNTER — Inpatient Hospital Stay: Payer: Medicare Other

## 2017-11-13 ENCOUNTER — Telehealth: Payer: Self-pay | Admitting: Hematology and Oncology

## 2017-11-13 ENCOUNTER — Encounter: Payer: Self-pay | Admitting: Hematology and Oncology

## 2017-11-13 VITALS — BP 137/80 | HR 78 | Temp 98.0°F | Resp 17

## 2017-11-13 VITALS — BP 131/71 | HR 81 | Temp 98.5°F | Resp 18 | Ht 65.5 in | Wt 198.1 lb

## 2017-11-13 DIAGNOSIS — C787 Secondary malignant neoplasm of liver and intrahepatic bile duct: Secondary | ICD-10-CM | POA: Diagnosis not present

## 2017-11-13 DIAGNOSIS — D509 Iron deficiency anemia, unspecified: Secondary | ICD-10-CM

## 2017-11-13 DIAGNOSIS — C541 Malignant neoplasm of endometrium: Secondary | ICD-10-CM | POA: Insufficient documentation

## 2017-11-13 DIAGNOSIS — R110 Nausea: Secondary | ICD-10-CM | POA: Diagnosis not present

## 2017-11-13 DIAGNOSIS — R748 Abnormal levels of other serum enzymes: Secondary | ICD-10-CM

## 2017-11-13 DIAGNOSIS — Z5111 Encounter for antineoplastic chemotherapy: Secondary | ICD-10-CM | POA: Diagnosis present

## 2017-11-13 LAB — CMP (CANCER CENTER ONLY)
ALBUMIN: 3.2 g/dL — AB (ref 3.5–5.0)
ALK PHOS: 165 U/L — AB (ref 40–150)
ALT: 19 U/L (ref 0–55)
AST: 19 U/L (ref 5–34)
Anion gap: 8 (ref 3–11)
BILIRUBIN TOTAL: 0.3 mg/dL (ref 0.2–1.2)
BUN: 18 mg/dL (ref 7–26)
CALCIUM: 10.2 mg/dL (ref 8.4–10.4)
CO2: 28 mmol/L (ref 22–29)
Chloride: 103 mmol/L (ref 98–109)
Creatinine: 0.86 mg/dL (ref 0.60–1.10)
GFR, Est AFR Am: >60 mL/min (ref >60)
GLUCOSE: 89 mg/dL (ref 70–140)
POTASSIUM: 4.1 mmol/L (ref 3.5–5.1)
Sodium: 139 mmol/L (ref 136–145)
TOTAL PROTEIN: 7.1 g/dL (ref 6.4–8.3)

## 2017-11-13 LAB — CBC WITH DIFFERENTIAL (CANCER CENTER ONLY)
BASOS PCT: 1 %
Basophils Absolute: 0.1 10*3/uL (ref 0.0–0.1)
EOS ABS: 0.1 10*3/uL (ref 0.0–0.5)
EOS PCT: 1 %
HEMATOCRIT: 28 % — AB (ref 34.8–46.6)
Hemoglobin: 9.3 g/dL — ABNORMAL LOW (ref 11.6–15.9)
LYMPHS PCT: 14 %
Lymphs Abs: 0.9 10*3/uL (ref 0.9–3.3)
MCH: 25.9 pg (ref 25.1–34.0)
MCHC: 33.4 g/dL (ref 31.5–36.0)
MCV: 77.6 fL — ABNORMAL LOW (ref 79.5–101.0)
Monocytes Absolute: 0.9 10*3/uL (ref 0.1–0.9)
Monocytes Relative: 15 %
Neutro Abs: 4.2 10*3/uL (ref 1.5–6.5)
Neutrophils Relative %: 69 %
Platelet Count: 347 10*3/uL (ref 145–400)
RBC: 3.61 MIL/uL — ABNORMAL LOW (ref 3.70–5.45)
RDW: 18.6 % — AB (ref 11.2–14.5)
WBC: 6 10*3/uL (ref 3.9–10.3)

## 2017-11-13 MED ORDER — DEXTROSE 5 % IV SOLN
Freq: Once | INTRAVENOUS | Status: AC
  Administered 2017-11-13: 14:00:00 via INTRAVENOUS

## 2017-11-13 MED ORDER — DOXORUBICIN HCL LIPOSOMAL CHEMO INJECTION 2 MG/ML
38.5000 mg/m2 | Freq: Once | INTRAVENOUS | Status: AC
  Administered 2017-11-13: 80 mg via INTRAVENOUS
  Filled 2017-11-13: qty 40

## 2017-11-13 MED ORDER — SODIUM CHLORIDE 0.9% FLUSH
10.0000 mL | INTRAVENOUS | Status: DC | PRN
  Administered 2017-11-13: 10 mL
  Filled 2017-11-13: qty 10

## 2017-11-13 MED ORDER — SODIUM CHLORIDE 0.9 % IV SOLN
Freq: Once | INTRAVENOUS | Status: AC
  Administered 2017-11-13: 13:00:00 via INTRAVENOUS

## 2017-11-13 MED ORDER — SODIUM CHLORIDE 0.9% FLUSH
10.0000 mL | Freq: Once | INTRAVENOUS | Status: AC
  Administered 2017-11-13: 10 mL
  Filled 2017-11-13: qty 10

## 2017-11-13 MED ORDER — DEXAMETHASONE SODIUM PHOSPHATE 10 MG/ML IJ SOLN
INTRAMUSCULAR | Status: AC
  Filled 2017-11-13: qty 1

## 2017-11-13 MED ORDER — SODIUM CHLORIDE 0.9 % IV SOLN
510.0000 mg | Freq: Once | INTRAVENOUS | Status: AC
  Administered 2017-11-13: 510 mg via INTRAVENOUS
  Filled 2017-11-13: qty 17

## 2017-11-13 MED ORDER — DEXAMETHASONE SODIUM PHOSPHATE 10 MG/ML IJ SOLN
10.0000 mg | Freq: Once | INTRAMUSCULAR | Status: AC
  Administered 2017-11-13: 10 mg via INTRAVENOUS

## 2017-11-13 MED ORDER — HEPARIN SOD (PORK) LOCK FLUSH 100 UNIT/ML IV SOLN
500.0000 [IU] | Freq: Once | INTRAVENOUS | Status: AC | PRN
  Administered 2017-11-13: 500 [IU]
  Filled 2017-11-13: qty 5

## 2017-11-13 NOTE — Patient Instructions (Signed)

## 2017-11-13 NOTE — Telephone Encounter (Signed)
Gave avs and calendar ° °

## 2017-11-13 NOTE — Assessment & Plan Note (Signed)
The most likely cause of her anemia is due to chronic blood loss/malabsorption syndrome. We discussed some of the risks, benefits, and alternatives of intravenous iron infusions. The patient is symptomatic from anemia and the iron level is critically low. She tolerated oral iron supplement poorly and desires to achieved higher levels of iron faster for adequate hematopoesis. Some of the side-effects to be expected including risks of infusion reactions, phlebitis, headaches, nausea and fatigue.  The patient is willing to proceed. Patient education material was dispensed.  Goal is to keep ferritin level greater than 50 or normalization of anemia She is in agreement to proceed with IV iron today

## 2017-11-13 NOTE — Assessment & Plan Note (Signed)
Liver enzymes are stable except for elevated alkaline phosphatase She is not symptomatic Observe only.

## 2017-11-13 NOTE — Progress Notes (Signed)
[facility] OFFICE PROGRESS NOTE  Patient Care Team: [name], MD as PCP - General (Family Medicine)  ASSESSMENT & PLAN:  Recurrent carcinoma of endometrium [facility]) She tolerated treatment well except for anemia and nausea We will proceed with treatment without dose adjustment I plan to repeat imaging study before cycle 4 of treatment I plan to order echocardiogram in 3 months   Metastasis to liver [facility]) Liver enzymes are stable except for elevated alkaline phosphatase She is not symptomatic Observe only.  Iron deficiency anemia The most likely cause of her anemia is due to chronic blood loss/malabsorption syndrome. We discussed some of the risks, benefits, and alternatives of intravenous iron infusions. The patient is symptomatic from anemia and the iron level is critically low. She tolerated oral iron supplement poorly and desires to achieved higher levels of iron faster for adequate hematopoesis. Some of the side-effects to be expected including risks of infusion reactions, phlebitis, headaches, nausea and fatigue.  The patient is willing to proceed. Patient education material was dispensed.  Goal is to keep ferritin level greater than 50 or normalization of anemia She is in agreement to proceed with IV iron today   Chronic nausea She has ongoing chronic nausea We discussed dietary modification and antiemetics as needed   Orders Placed This Encounter  Procedures  . CT ABDOMEN PELVIS W CONTRAST    Standing Status:   Future    Standing Expiration Date:   11/14/2018    Order Specific Question:   If indicated for the ordered procedure, I authorize the administration of contrast media per Radiology protocol    Answer:   Yes    Order Specific Question:   Preferred imaging location?    Answer:   [facility]    Order Specific Question:   Radiology Contrast Protocol - do NOT remove file path    Answer:   [email or web address]     INTERVAL HISTORY: Please see below for problem oriented charting. She returns with her sister for further follow-up She complained of chronic nausea for the past 4 weeks She has lost some weight No vomiting Denies recent changes in bowel habits She has significant pica with difficulties tolerating certain food The patient denies any recent signs or symptoms of bleeding such as spontaneous epistaxis, hematuria or hematochezia. She complained of restless leg and excessive fatigue  SUMMARY OF ONCOLOGIC HISTORY: Oncology History   MSI stable on June 2017 tissue but high from [facility] study from November 2017  05/15/16: ER is moderately positive (70%). PR is strongly positive (80%).     Recurrent carcinoma of endometrium ([place])   01/02/2016 Pathology Results    Uterus +/- tubes/ovaries, neoplastic, cervix ENDOMETRIAL ADENOCARCINOMA, FIGO GRADE 2 (4.7 CM) THE TUMOR INVADES LESS THAN ONE-HALF OF THE MYOMETRIUM (PT1A) ALL MARGINS OF RESECTION ARE NEGATIVE FOR CARCINOMA LEIOMYOMAS AND ADENOMYOSIS BILATERAL FALLOPIAN TUBES AND OVARIES: HISTOLOGICAL UNREMARKABLE 2. Lymph node, sentinel, biopsy, right obturator ONE BENIGN LYMPH NODE (0/1) 3. Lymph nodes, regional resection, left pelvic FOUR BENIGN LYMPH NODES (0/4)      01/02/2016 Surgery    Dr. Denman George performed robotic-assisted laparoscopic total hysterectomy with bilateral salpingoophorectomy, sentinel lymph node biopsy, lymphadenectomy        05/15/2016 Pathology Results    Vagina, biopsy, mid - ADENOCARCINOMA, SEE COMMENT. Microscopic Comment The morphology along with the patient's history are consistent with recurrent endometrioid adenocarcinoma. The carcinoma has a similar appearance to the primary ([id number]).      05/20/2016 Imaging  Ct scan abdomen showed solid 2.5 cm peritoneal mass in the mid to anterior left pelvis, suspicious for peritoneal metastasis. 2. Small expansile low-attenuation filling defect in the  left external iliac vein, cannot exclude a small deep venous thrombus. Consider correlation with left lower extremity venous Doppler scan. 3. Small simple fluid density structure in the left pelvic sidewall abutting the left external iliac vessels, favor a small postoperative seroma. 4. No ascites. 5. No lymphadenopathy.  No metastatic disease in the chest. 6. Aortic atherosclerosis.      06/12/2016 PET scan    Intensely hypermetabolic 2.1 cm central pelvic peritoneal mass just to the left of midline, consistent with peritoneal metastatic recurrence. No ascites. 2. No additional hypermetabolic sites of metastatic disease. 3. Diffuse thyroid hypermetabolism without discrete thyroid nodule, favoring thyroiditis. Recommend correlation with serum thyroid function tests.      06/24/2016 - 07/22/2016 Chemotherapy    The patient had weekly cisplatin. She has missed several doses due to infection and pancytopenia      06/24/2016 - 09/04/2016 Radiation Therapy    She completed concurrent radiation therapy Radiation treatment dates:   IMRT : 06/24/16 - 08/01/16 HDR : 08/13/16, 08/20/16, 08/27/16, 09/04/16  Site/dose:   Pelvis treated to 55 Gy in 25 fractions (simultaneous integrated boost technique) Vaginal Cuff treated to 24 Gy in 4 fractions      08/15/2016 - 12/03/2016 Chemotherapy    She received 6 cycles of carboplatin/Taxol      09/27/2016 Imaging    Interval decrease in size of previously described solid peritoneal nodule within the left anterior pelvis. Near complete resolution of previously described low-attenuation structure along the left pelvic sidewall. No evidence for metastatic disease in the chest. Aortic atherosclerosis.      12/30/2016 Imaging    Ct abdomen 1. Solitary left pelvic peritoneal implant is mildly decreased in size in the interval. 2. No new or progressive metastatic disease in the abdomen or pelvis. No ascites. 3. Aortic atherosclerosis.      04/02/2017 Imaging    Left  lower quadrant peritoneal implant referenced on previous exam measures 1.7 x 1.3 cm, image 69 of series 2. Increased from 0.8 x 0.8 cm previously peer no new peritoneal implants identified.  Musculoskeletal: The degenerative disc disease noted within the lumbar spine.  IMPRESSION: 1. Solitary left pelvic peritoneal implant is increased in size in the interval. 2. No new sites of disease.  No ascites. 3. Aortic atherosclerosis      04/11/2017 PET scan    1. The left side of pelvis peritoneal implant has decreased in size and degree of FDG uptake compatible with response to therapy. No new areas of peritoneal disease identified. 2. Persistent diffuse increased uptake within the thyroid gland. Correlation with patient's thyroid function may be helpful.      05/26/2017 Imaging    1. Enlarging tumor implant along the left adnexa, currently 2.6 by 2.4 cm and previously 1.9 by 1.3 cm. No new tumor implant or other specific cause for the patient's pelvic symptoms is currently identified. 2.  Aortic Atherosclerosis (ICD10-I70.0). 3. Lumbar spondylosis and degenerative disc disease causing multilevel impingement.      06/04/2017 - 08/28/2017 Chemotherapy    The patient started letrozole and everolimus      08/26/2017 Imaging    Increased size of mass in the left adnexal region.  Several new small liver metastases in right hepatic lobe      09/08/2017 Imaging    LV EF: 60% -  65%  09/10/2017 Procedure    Technically successful right IJ power-injectable port catheter placement. Ready for routine use       REVIEW OF SYSTEMS:   Constitutional: Denies fevers, chills or abnormal weight loss Eyes: Denies blurriness of vision Ears, nose, mouth, throat, and face: Denies mucositis or sore throat Respiratory: Denies cough, dyspnea or wheezes Cardiovascular: Denies palpitation, chest discomfort or lower extremity swelling Skin: Denies abnormal skin rashes Lymphatics: Denies new  lymphadenopathy or easy bruising Neurological:Denies numbness, tingling or new weaknesses Behavioral/Psych: Mood is stable, no new changes  All other systems were reviewed with the patient and are negative.  I have reviewed the past medical history, past surgical history, social history and family history with the patient and they are unchanged from previous note.  ALLERGIES:  is allergic to latex.  MEDICATIONS:  Current Outpatient Medications  Medication Sig Dispense Refill  . acetaminophen (TYLENOL) 325 MG tablet Take 650 mg by mouth every 6 (six) hours as needed.    . ALPRAZolam (XANAX) 1 MG tablet Take one tablet at bedtime as needed for insomnia and restless legs.    Marland Kitchen amoxicillin (AMOXIL) 500 MG capsule Take 2,000 mg by mouth See admin instructions. Prior to dental appointment, takes 4 tables prior to appt    . benzonatate (TESSALON) 200 MG capsule TAKE ONE CAPSULE 3 TIMES A DAY AS NEEDED FOR COUGH  2  . Biotin w/ Vitamins C & E (HAIR/SKIN/NAILS PO) Take 1 tablet by mouth daily.    . Calcium Carb-Cholecalciferol (CALCIUM 600 + D PO) Take 2 tablets by mouth daily.    . celecoxib (CELEBREX) 200 MG capsule Take 1 capsule twice a day with food as needed for chest wall pain.    . chlorhexidine (PERIDEX) 0.12 % solution Use as directed 15 mLs in the mouth or throat 2 (two) times daily. (Patient not taking: Reported on 07/07/2017) 120 mL 0  . clonazePAM (KLONOPIN) 0.5 MG tablet Take 1 tablet (0.5 mg total) by mouth daily as needed (restless legs). (Patient taking differently: Take 0.5 mg by mouth at bedtime as needed (restless legs). ) 60 tablet 1  . fluticasone (FLONASE) 50 MCG/ACT nasal spray Place 1 spray into both nostrils 2 (two) times daily as needed for allergies.   1  . gabapentin (NEURONTIN) 600 MG tablet TAKE 1 TABLET BY MOUTH TWICE A DAY (Patient taking differently: Take 1 tablet 2 times a day) 60 tablet 9  . guaiFENesin-codeine 100-10 MG/5ML syrup Take 1 or two teaspoons at bedtime  as needed for cough. May repeat during the night.    Marland Kitchen HYDROcodone-acetaminophen (NORCO/VICODIN) 5-325 MG tablet Take one tablet at bedtime as needed for pain    . HYDROmorphone (DILAUDID) 4 MG tablet Take 1 tablet (4 mg total) by mouth every 4 (four) hours as needed for severe pain. 60 tablet 0  . ibuprofen (ADVIL,MOTRIN) 200 MG tablet Take 400 mg by mouth every 8 (eight) hours as needed for mild pain.    Marland Kitchen levothyroxine (SYNTHROID, LEVOTHROID) 88 MCG tablet Take 88 mcg by mouth daily.  3  . lidocaine-prilocaine (EMLA) cream Apply to affected area once 30 g 3  . loratadine (CLARITIN) 10 MG tablet Take 10 mg by mouth daily.    Marland Kitchen LORazepam (ATIVAN) 1 MG tablet Take 1 tablet (1 mg total) by mouth at bedtime as needed for sleep. 30 tablet 0  . Magnesium 250 MG TABS Take 250 mg by mouth daily.    . Melatonin 10 MG CAPS Take 10 mg  by mouth at bedtime.    . ondansetron (ZOFRAN) 8 MG tablet Take 1 tablet (8 mg total) by mouth every 8 (eight) hours as needed for nausea. 60 tablet 1  . potassium gluconate (HM POTASSIUM) 595 (99 K) MG TABS tablet Take 595 mg by mouth daily.    . prochlorperazine (COMPAZINE) 10 MG tablet Take 1 tablet (10 mg total) by mouth every 6 (six) hours as needed (Nausea or vomiting). 30 tablet 1  . traZODone (DESYREL) 50 MG tablet Take 1.5 tablets (75 mg total) by mouth at bedtime. 135 tablet 1   No current facility-administered medications for this visit.    Facility-Administered Medications Ordered in Other Visits  Medication Dose Route Frequency Provider Last Rate Last Dose  . 0.9 %  sodium chloride infusion   Intravenous Once Alvy Bimler, Sheyli Horwitz, MD      . dexamethasone (DECADRON) injection 10 mg  10 mg Intravenous Once Robbin Escher, MD      . dextrose 5 % solution   Intravenous Once Alvy Bimler, Oneka Parada, MD      . DOXOrubicin HCL LIPOSOMAL (DOXIL) 84 mg in dextrose 5 % 250 mL chemo infusion  40 mg/m2 (Treatment Plan Recorded) Intravenous Once Alvy Bimler, Klark Vanderhoef, MD      . ferumoxytol (FERAHEME) 510  mg in sodium chloride 0.9 % 100 mL IVPB  510 mg Intravenous Once Linkin Vizzini, MD      . heparin lock flush 100 unit/mL  500 Units Intracatheter Once PRN Alvy Bimler, Anaiya Wisinski, MD      . sodium chloride flush (NS) 0.9 % injection 10 mL  10 mL Intracatheter PRN Alvy Bimler, Archie Atilano, MD        PHYSICAL EXAMINATION: ECOG PERFORMANCE STATUS: 1 - Symptomatic but completely ambulatory  Vitals:   11/13/17 1140  BP: 131/71  Pulse: 81  Resp: 18  Temp: 98.5 F (36.9 C)  SpO2: 97%   Filed Weights   11/13/17 1140  Weight: 198 lb 1.6 oz (89.9 kg)    GENERAL:alert, no distress and comfortable SKIN: skin color, texture, turgor are normal, no rashes or significant lesions EYES: normal, Conjunctiva are pink and non-injected, sclera clear OROPHARYNX:no exudate, no erythema and lips, buccal mucosa, and tongue normal  NECK: supple, thyroid normal size, non-tender, without nodularity LYMPH:  no palpable lymphadenopathy in the cervical, axillary or inguinal LUNGS: clear to auscultation and percussion with normal breathing effort HEART: regular rate & rhythm and no murmurs and no lower extremity edema ABDOMEN:abdomen soft, non-tender and normal bowel sounds Musculoskeletal:no cyanosis of digits and no clubbing  NEURO: alert & oriented x 3 with fluent speech, no focal motor/sensory deficits  LABORATORY DATA:  I have reviewed the data as listed    Component Value Date/Time   NA 139 11/13/2017 1122   NA 141 07/09/2017 1312   K 4.1 11/13/2017 1122   K 3.9 07/09/2017 1312   CL 103 11/13/2017 1122   CO2 28 11/13/2017 1122   CO2 27 07/09/2017 1312   GLUCOSE 89 11/13/2017 1122   GLUCOSE 109 07/09/2017 1312   BUN 18 11/13/2017 1122   BUN 16.4 07/09/2017 1312   CREATININE 0.86 11/13/2017 1122   CREATININE 0.8 07/09/2017 1312   CALCIUM 10.2 11/13/2017 1122   CALCIUM 9.2 07/09/2017 1312   PROT 7.1 11/13/2017 1122   PROT 7.1 07/09/2017 1312   ALBUMIN 3.2 (L) 11/13/2017 1122   ALBUMIN 3.1 (L) 07/09/2017 1312   AST  19 11/13/2017 1122   AST 35 (H) 07/09/2017 1312   ALT 19 11/13/2017  1122   ALT 43 07/09/2017 1312   ALKPHOS 165 (H) 11/13/2017 1122   ALKPHOS 182 (H) 07/09/2017 1312   BILITOT 0.3 11/13/2017 1122   BILITOT 0.34 07/09/2017 1312   GFRNONAA >60 11/13/2017 1122   GFRAA >60 11/13/2017 1122    No results found for: SPEP, UPEP  Lab Results  Component Value Date   WBC 6.0 11/13/2017   NEUTROABS 4.2 11/13/2017   HGB 9.3 (L) 11/13/2017   HCT 28.0 (L) 11/13/2017   MCV 77.6 (L) 11/13/2017   PLT 347 11/13/2017      Chemistry      Component Value Date/Time   NA 139 11/13/2017 1122   NA 141 07/09/2017 1312   K 4.1 11/13/2017 1122   K 3.9 07/09/2017 1312   CL 103 11/13/2017 1122   CO2 28 11/13/2017 1122   CO2 27 07/09/2017 1312   BUN 18 11/13/2017 1122   BUN 16.4 07/09/2017 1312   CREATININE 0.86 11/13/2017 1122   CREATININE 0.8 07/09/2017 1312      Component Value Date/Time   CALCIUM 10.2 11/13/2017 1122   CALCIUM 9.2 07/09/2017 1312   ALKPHOS 165 (H) 11/13/2017 1122   ALKPHOS 182 (H) 07/09/2017 1312   AST 19 11/13/2017 1122   AST 35 (H) 07/09/2017 1312   ALT 19 11/13/2017 1122   ALT 43 07/09/2017 1312   BILITOT 0.3 11/13/2017 1122   BILITOT 0.34 07/09/2017 1312      All questions were answered. The patient knows to call the clinic with any problems, questions or concerns. No barriers to learning was detected.  I spent 25 minutes counseling the patient face to face. The total time spent in the appointment was 30 minutes and more than 50% was on counseling and review of test results  Heath Lark, MD 11/13/2017 1:08 PM

## 2017-11-13 NOTE — Assessment & Plan Note (Signed)
She has ongoing chronic nausea We discussed dietary modification and antiemetics as needed

## 2017-11-13 NOTE — Assessment & Plan Note (Signed)
She tolerated treatment well except for anemia and nausea We will proceed with treatment without dose adjustment I plan to repeat imaging study before cycle 4 of treatment I plan to order echocardiogram in 3 months

## 2017-11-13 NOTE — Patient Instructions (Addendum)
[facility] Discharge Instructions for Patients Receiving Chemotherapy  Today you received the following chemotherapy agents:  Doxil (liposomal doxorubicin)  To help prevent nausea and vomiting after your treatment, we encourage you to take your nausea medication as prescribed.   If you develop nausea and vomiting that is not controlled by your nausea medication, call the clinic.   BELOW ARE SYMPTOMS THAT SHOULD BE REPORTED IMMEDIATELY:  *FEVER GREATER THAN 100.5 F  *CHILLS WITH OR WITHOUT FEVER  NAUSEA AND VOMITING THAT IS NOT CONTROLLED WITH YOUR NAUSEA MEDICATION  *UNUSUAL SHORTNESS OF BREATH  *UNUSUAL BRUISING OR BLEEDING  TENDERNESS IN MOUTH AND THROAT WITH OR WITHOUT PRESENCE OF ULCERS  *URINARY PROBLEMS  *BOWEL PROBLEMS  UNUSUAL RASH Items with * indicate a potential emergency and should be followed up as soon as possible.  Feel free to call the clinic should you have any questions or concerns. The clinic phone number is [phone].  Please show the [place] at check-in to the Emergency Department and triage nurse.  Ferumoxytol injection What is this medicine? FERUMOXYTOL is an iron complex. Iron is used to make healthy red blood cells, which carry oxygen and nutrients throughout the body. This medicine is used to treat iron deficiency anemia in people with chronic kidney disease. This medicine may be used for other purposes; ask your health care provider or pharmacist if you have questions. COMMON BRAND NAME(S): Feraheme What should I tell my health care provider before I take this medicine? They need to know if you have any of these conditions: -anemia not caused by low iron levels -high levels of iron in the blood -magnetic resonance imaging (MRI) test scheduled -an unusual or allergic reaction to iron, other medicines, foods, dyes, or preservatives -pregnant or trying to get pregnant -breast-feeding How should I use this  medicine? This medicine is for injection into a vein. It is given by a health care professional in a hospital or clinic setting. Talk to your pediatrician regarding the use of this medicine in children. Special care may be needed. Overdosage: If you think you have taken too much of this medicine contact a poison control center or emergency room at once. NOTE: This medicine is only for you. Do not share this medicine with others. What if I miss a dose? It is important not to miss your dose. Call your doctor or health care professional if you are unable to keep an appointment. What may interact with this medicine? This medicine may interact with the following medications: -other iron products This list may not describe all possible interactions. Give your health care provider a list of all the medicines, herbs, non-prescription drugs, or dietary supplements you use. Also tell them if you smoke, drink alcohol, or use illegal drugs. Some items may interact with your medicine. What should I watch for while using this medicine? Visit your doctor or healthcare professional regularly. Tell your doctor or healthcare professional if your symptoms do not start to get better or if they get worse. You may need blood work done while you are taking this medicine. You may need to follow a special diet. Talk to your doctor. Foods that contain iron include: whole grains/cereals, dried fruits, beans, or peas, leafy green vegetables, and organ meats (liver, kidney). What side effects may I notice from receiving this medicine? Side effects that you should report to your doctor or health care professional as soon as possible: -allergic reactions like skin rash, itching or hives, swelling  of the face, lips, or tongue -breathing problems -changes in blood pressure -feeling faint or lightheaded, falls -fever or chills -flushing, sweating, or hot feelings -swelling of the ankles or feet Side effects that usually do not  require medical attention (report to your doctor or health care professional if they continue or are bothersome): -diarrhea -headache -nausea, vomiting -stomach pain This list may not describe all possible side effects. Call your doctor for medical advice about side effects. You may report side effects to FDA at 1-800-FDA-1088. Where should I keep my medicine? This drug is given in a hospital or clinic and will not be stored at home. NOTE: This sheet is a summary. It may not cover all possible information. If you have questions about this medicine, talk to your doctor, pharmacist, or health care provider.  2018 Elsevier/Gold Standard (2015-07-27 12:41:49)   Doxorubicin injection What is this medicine? DOXORUBICIN (dox oh ROO bi sin) is a chemotherapy drug. It is used to treat many kinds of cancer like leukemia, lymphoma, neuroblastoma, sarcoma, and Wilms' tumor. It is also used to treat bladder cancer, breast cancer, lung cancer, ovarian cancer, stomach cancer, and thyroid cancer. This medicine may be used for other purposes; ask your health care provider or pharmacist if you have questions. COMMON BRAND NAME(S): Adriamycin, Adriamycin PFS, Adriamycin RDF, Rubex What should I tell my health care provider before I take this medicine? They need to know if you have any of these conditions: -heart disease -history of low blood counts caused by a medicine -liver disease -recent or ongoing radiation therapy -an unusual or allergic reaction to doxorubicin, other chemotherapy agents, other medicines, foods, dyes, or preservatives -pregnant or trying to get pregnant -breast-feeding How should I use this medicine? This drug is given as an infusion into a vein. It is administered in a hospital or clinic by a specially trained health care professional. If you have pain, swelling, burning or any unusual feeling around the site of your injection, tell your health care professional right away. Talk to  your pediatrician regarding the use of this medicine in children. Special care may be needed. Overdosage: If you think you have taken too much of this medicine contact a poison control center or emergency room at once. NOTE: This medicine is only for you. Do not share this medicine with others. What if I miss a dose? It is important not to miss your dose. Call your doctor or health care professional if you are unable to keep an appointment. What may interact with this medicine? This medicine may interact with the following medications: -6-mercaptopurine -paclitaxel -phenytoin -St. John's Wort -trastuzumab -verapamil This list may not describe all possible interactions. Give your health care provider a list of all the medicines, herbs, non-prescription drugs, or dietary supplements you use. Also tell them if you smoke, drink alcohol, or use illegal drugs. Some items may interact with your medicine. What should I watch for while using this medicine? This drug may make you feel generally unwell. This is not uncommon, as chemotherapy can affect healthy cells as well as cancer cells. Report any side effects. Continue your course of treatment even though you feel ill unless your doctor tells you to stop. There is a maximum amount of this medicine you should receive throughout your life. The amount depends on the medical condition being treated and your overall health. Your doctor will watch how much of this medicine you receive in your lifetime. Tell your doctor if you have taken this medicine  before. You may need blood work done while you are taking this medicine. Your urine may turn red for a few days after your dose. This is not blood. If your urine is dark or brown, call your doctor. In some cases, you may be given additional medicines to help with side effects. Follow all directions for their use. Call your doctor or health care professional for advice if you get a fever, chills or sore throat, or  other symptoms of a cold or flu. Do not treat yourself. This drug decreases your body's ability to fight infections. Try to avoid being around people who are sick. This medicine may increase your risk to bruise or bleed. Call your doctor or health care professional if you notice any unusual bleeding. Talk to your doctor about your risk of cancer. You may be more at risk for certain types of cancers if you take this medicine. Do not become pregnant while taking this medicine or for 6 months after stopping it. Women should inform their doctor if they wish to become pregnant or think they might be pregnant. Men should not father a child while taking this medicine and for 6 months after stopping it. There is a potential for serious side effects to an unborn child. Talk to your health care professional or pharmacist for more information. Do not breast-feed an infant while taking this medicine. This medicine has caused ovarian failure in some women and reduced sperm counts in some men This medicine may interfere with the ability to have a child. Talk with your doctor or health care professional if you are concerned about your fertility. What side effects may I notice from receiving this medicine? Side effects that you should report to your doctor or health care professional as soon as possible: -allergic reactions like skin rash, itching or hives, swelling of the face, lips, or tongue -breathing problems -chest pain -fast or irregular heartbeat -low blood counts - this medicine may decrease the number of white blood cells, red blood cells and platelets. You may be at increased risk for infections and bleeding. -pain, redness, or irritation at site where injected -signs of infection - fever or chills, cough, sore throat, pain or difficulty passing urine -signs of decreased platelets or bleeding - bruising, pinpoint red spots on the skin, black, tarry stools, blood in the urine -swelling of the ankles, feet,  hands -tiredness -weakness Side effects that usually do not require medical attention (report to your doctor or health care professional if they continue or are bothersome): -diarrhea -hair loss -mouth sores -nail discoloration or damage -nausea -red colored urine -vomiting This list may not describe all possible side effects. Call your doctor for medical advice about side effects. You may report side effects to FDA at 1-800-FDA-1088. Where should I keep my medicine? This drug is given in a hospital or clinic and will not be stored at home. NOTE: This sheet is a summary. It may not cover all possible information. If you have questions about this medicine, talk to your doctor, pharmacist, or health care provider.  2018 Elsevier/Gold Standard (2015-08-21 11:28:51)

## 2017-11-14 ENCOUNTER — Telehealth: Payer: Self-pay | Admitting: *Deleted

## 2017-11-14 NOTE — Telephone Encounter (Signed)
Please call her. She has a lot of nausea already. I have discussed with her about diet and for now I do not recommend oral iron.  Notified of above message. Verbalized understanding

## 2017-11-14 NOTE — Telephone Encounter (Signed)
Tammi,  Please call her. She has a lot of nausea already. I have discussed with her about diet and for now I do not recommend oral iron

## 2017-11-14 NOTE — Telephone Encounter (Signed)
Call from pt asking when she should resume oral iron. Pt stated she doesn't want to have to take IV iron again. Will review with provider for instructions.

## 2017-11-20 ENCOUNTER — Other Ambulatory Visit: Payer: Self-pay | Admitting: Hematology and Oncology

## 2017-11-24 ENCOUNTER — Other Ambulatory Visit: Payer: Self-pay | Admitting: *Deleted

## 2017-11-24 ENCOUNTER — Telehealth: Payer: Self-pay | Admitting: *Deleted

## 2017-11-24 ENCOUNTER — Other Ambulatory Visit: Payer: Self-pay | Admitting: Hematology and Oncology

## 2017-11-24 DIAGNOSIS — C541 Malignant neoplasm of endometrium: Secondary | ICD-10-CM

## 2017-11-24 MED ORDER — LORAZEPAM 0.5 MG PO TABS: 0.5000 mg | ORAL_TABLET | Freq: Three times a day (TID) | ORAL | 0 refills | Status: DC | PRN

## 2017-11-24 NOTE — Telephone Encounter (Signed)
Pt called requesting a call back from nurse.  Spoke with pt and was informed that since last chemo on 11/13/17, pt has no energy, has no appetite, feeling nauseated, and not eating well at all.  Stated she has problems with constipation, which resolved with stool softeners.  Denied fever, no problems with voiding.  Stated she has been drinking enough fluids.  Pt wanted to know what Dr. Alvy Bimler would recommend. Dr. Alvy Bimler notified.   Spoke with pt again, and informed pt that Lorazepam 0.5 mg will be called in to her pharmacy - to help with nausea.  Caution pt NOT to drive after taking Ativan  Or  Compazine since these meds can cause drowsiness. Reinforced with pt how to take antiemetics before meals.  Reinforced need to continue with increased fluid intake as tolerated, and to avoid spicy or greasy foods. Offered [facility] visit with possible IVF; however, pt declined.  Stated she would like to wait it out for another couple of days. Pt is scheduled for CT scan on 12/10/17. Pt's    Phone      [phone].

## 2017-11-25 ENCOUNTER — Telehealth: Payer: Self-pay

## 2017-11-25 NOTE — Telephone Encounter (Signed)
Called radiology scheduling. They will call patient and reschedule to a earlier appt.

## 2017-11-25 NOTE — Telephone Encounter (Signed)
Please call CT to see if we can move the Ct up Order labs and flush 1 hour before CT Let me know when so I can see her sooner

## 2017-11-25 NOTE — Telephone Encounter (Signed)
CT scan rescheduled to 5/29. Scheduling message sent for lab and flush prior to CT scan. Patient verbalized understanding. Instructed to call for questions or concerns.

## 2017-11-25 NOTE — Telephone Encounter (Signed)
Her CT is scheduled for 6/5. If she is worried we can try to move it to be done sooner

## 2017-11-25 NOTE — Telephone Encounter (Signed)
I am overbooked on 5/30 and 5/31 I will see her on 6/3. I will send scheduling msg

## 2017-11-25 NOTE — Telephone Encounter (Signed)
Called back and given below message. She would like to have the CT scan earlier if possible.

## 2017-11-25 NOTE — Telephone Encounter (Signed)
She called and left message regarding abdominal pain last night.   Called back. Her nausea is better today. Last night for dinner she ate baked chicken and fruit. With jello at bedtime. When she went bed her whole abdomen hurt, she rates it at a 8 or 9. Her abdomen rumbled and gripped and she was unable to sleep. She took Klonopin during the night, then she took a Dilaudid during the night and finally went to sleep. She woke up this morning and the pain is all gone. She ate breakfast this morning with no problems. Denies constipation and diarrhea. She did not ever eat and is afraid the same thing will happen tonight.

## 2017-11-26 ENCOUNTER — Telehealth: Payer: Self-pay | Admitting: Hematology and Oncology

## 2017-11-26 NOTE — Telephone Encounter (Signed)
Spoke to patient regarding upcoming may and June appointments per 5/21 sch message

## 2017-12-03 ENCOUNTER — Encounter (HOSPITAL_COMMUNITY): Payer: Self-pay

## 2017-12-03 ENCOUNTER — Inpatient Hospital Stay: Payer: Medicare Other

## 2017-12-03 ENCOUNTER — Ambulatory Visit (HOSPITAL_COMMUNITY)
Admission: RE | Admit: 2017-12-03 | Discharge: 2017-12-03 | Disposition: A | Discharge status: Home / Self Care | Payer: Medicare Other | Source: Ambulatory Visit | Attending: Hematology and Oncology | Admitting: Hematology and Oncology

## 2017-12-03 DIAGNOSIS — C541 Malignant neoplasm of endometrium: Secondary | ICD-10-CM

## 2017-12-03 DIAGNOSIS — C787 Secondary malignant neoplasm of liver and intrahepatic bile duct: Secondary | ICD-10-CM

## 2017-12-03 DIAGNOSIS — Z5111 Encounter for antineoplastic chemotherapy: Secondary | ICD-10-CM | POA: Diagnosis not present

## 2017-12-03 LAB — CMP (CANCER CENTER ONLY)
ALK PHOS: 205 U/L — AB (ref 40–150)
ALT: 13 U/L (ref 0–55)
ANION GAP: 12 — AB (ref 3–11)
AST: 16 U/L (ref 5–34)
Albumin: 3.1 g/dL — ABNORMAL LOW (ref 3.5–5.0)
BUN: 19 mg/dL (ref 7–26)
CALCIUM: 10.2 mg/dL (ref 8.4–10.4)
CHLORIDE: 101 mmol/L (ref 98–109)
CO2: 24 mmol/L (ref 22–29)
Creatinine: 0.71 mg/dL (ref 0.60–1.10)
GFR, Estimated: >60 mL/min (ref >60)
Glucose, Bld: 90 mg/dL (ref 70–140)
Potassium: 3.8 mmol/L (ref 3.5–5.1)
SODIUM: 137 mmol/L (ref 136–145)
Total Bilirubin: 0.3 mg/dL (ref 0.2–1.2)
Total Protein: 7.3 g/dL (ref 6.4–8.3)

## 2017-12-03 LAB — CBC WITH DIFFERENTIAL (CANCER CENTER ONLY)
BASOS ABS: 0 10*3/uL (ref 0.0–0.1)
Basophils Relative: 0 %
EOS PCT: 1 %
Eosinophils Absolute: 0 10*3/uL (ref 0.0–0.5)
HEMATOCRIT: 27.8 % — AB (ref 34.8–46.6)
Hemoglobin: 8.9 g/dL — ABNORMAL LOW (ref 11.6–15.9)
LYMPHS PCT: 14 %
Lymphs Abs: 0.8 10*3/uL — ABNORMAL LOW (ref 0.9–3.3)
MCH: 26.2 pg (ref 25.1–34.0)
MCHC: 32 g/dL (ref 31.5–36.0)
MCV: 81.8 fL (ref 79.5–101.0)
MONO ABS: 0.6 10*3/uL (ref 0.1–0.9)
MONOS PCT: 11 %
Neutro Abs: 4.3 10*3/uL (ref 1.5–6.5)
Neutrophils Relative %: 74 %
PLATELETS: 314 10*3/uL (ref 145–400)
RBC: 3.4 MIL/uL — ABNORMAL LOW (ref 3.70–5.45)
RDW: 17.8 % — AB (ref 11.2–14.5)
WBC Count: 5.8 10*3/uL (ref 3.9–10.3)

## 2017-12-03 MED ORDER — SODIUM CHLORIDE 0.9% FLUSH
10.0000 mL | Freq: Once | INTRAVENOUS | Status: AC
  Administered 2017-12-03: 10 mL
  Filled 2017-12-03: qty 10

## 2017-12-03 MED ORDER — IOPAMIDOL (ISOVUE-300) INJECTION 61%
INTRAVENOUS | Status: AC
  Filled 2017-12-03: qty 100

## 2017-12-03 MED ORDER — HEPARIN SOD (PORK) LOCK FLUSH 100 UNIT/ML IV SOLN
500.0000 [IU] | Freq: Once | INTRAVENOUS | Status: AC
  Administered 2017-12-03: 500 [IU]

## 2017-12-03 MED ORDER — IOPAMIDOL (ISOVUE-300) INJECTION 61%
100.0000 mL | Freq: Once | INTRAVENOUS | Status: AC | PRN
  Administered 2017-12-03: 100 mL via INTRAVENOUS

## 2017-12-03 MED ORDER — HEPARIN SOD (PORK) LOCK FLUSH 100 UNIT/ML IV SOLN
INTRAVENOUS | Status: AC
  Filled 2017-12-03: qty 5

## 2017-12-03 NOTE — Patient Instructions (Signed)
Implanted Port Home Guide An implanted port is a type of central line that is placed under the skin. Central lines are used to provide IV access when treatment or nutrition needs to be given through a person's veins. Implanted ports are used for long-term IV access. An implanted port may be placed because:  You need IV medicine that would be irritating to the small veins in your hands or arms.  You need long-term IV medicines, such as antibiotics.  You need IV nutrition for a long period.  You need frequent blood draws for lab tests.  You need dialysis.  Implanted ports are usually placed in the chest area, but they can also be placed in the upper arm, the abdomen, or the leg. An implanted port has two main parts:  Reservoir. The reservoir is round and will appear as a small, raised area under your skin. The reservoir is the part where a needle is inserted to give medicines or draw blood.  Catheter. The catheter is a thin, flexible tube that extends from the reservoir. The catheter is placed into a large vein. Medicine that is inserted into the reservoir goes into the catheter and then into the vein.  How will I care for my incision site? Do not get the incision site wet. Bathe or shower as directed by your health care provider. How is my port accessed? Special steps must be taken to access the port:  Before the port is accessed, a numbing cream can be placed on the skin. This helps numb the skin over the port site.  Your health care provider uses a sterile technique to access the port. ? Your health care provider must put on a mask and sterile gloves. ? The skin over your port is cleaned carefully with an antiseptic and allowed to dry. ? The port is gently pinched between sterile gloves, and a needle is inserted into the port.  Only "non-coring" port needles should be used to access the port. Once the port is accessed, a blood return should be checked. This helps ensure that the port  is in the vein and is not clogged.  If your port needs to remain accessed for a constant infusion, a clear (transparent) bandage will be placed over the needle site. The bandage and needle will need to be changed every week, or as directed by your health care provider.  Keep the bandage covering the needle clean and dry. Do not get it wet. Follow your health care provider's instructions on how to take a shower or bath while the port is accessed.  If your port does not need to stay accessed, no bandage is needed over the port.  What is flushing? Flushing helps keep the port from getting clogged. Follow your health care provider's instructions on how and when to flush the port. Ports are usually flushed with saline solution or a medicine called heparin. The need for flushing will depend on how the port is used.  If the port is used for intermittent medicines or blood draws, the port will need to be flushed: ? After medicines have been given. ? After blood has been drawn. ? As part of routine maintenance.  If a constant infusion is running, the port may not need to be flushed.  How long will my port stay implanted? The port can stay in for as long as your health care provider thinks it is needed. When it is time for the port to come out, surgery will be   done to remove it. The procedure is similar to the one performed when the port was put in. When should I seek immediate medical care? When you have an implanted port, you should seek immediate medical care if:  You notice a bad smell coming from the incision site.  You have swelling, redness, or drainage at the incision site.  You have more swelling or pain at the port site or the surrounding area.  You have a fever that is not controlled with medicine.  This information is not intended to replace advice given to you by your health care provider. Make sure you discuss any questions you have with your health care provider. Document  Released: 06/24/2005 Document Revised: 11/30/2015 Document Reviewed: 03/01/2013 Elsevier Interactive Patient Education  2017 Elsevier Inc.  

## 2017-12-05 ENCOUNTER — Other Ambulatory Visit: Payer: Self-pay | Admitting: Hematology and Oncology

## 2017-12-08 ENCOUNTER — Encounter: Payer: Self-pay | Admitting: Hematology and Oncology

## 2017-12-08 ENCOUNTER — Inpatient Hospital Stay: Payer: Medicare Other | Attending: Hematology and Oncology | Admitting: Hematology and Oncology

## 2017-12-08 ENCOUNTER — Telehealth: Payer: Self-pay | Admitting: Hematology and Oncology

## 2017-12-08 VITALS — BP 129/68 | HR 77 | Temp 98.4°F | Resp 18 | Ht 65.5 in | Wt 191.1 lb

## 2017-12-08 DIAGNOSIS — R110 Nausea: Secondary | ICD-10-CM | POA: Insufficient documentation

## 2017-12-08 DIAGNOSIS — C541 Malignant neoplasm of endometrium: Secondary | ICD-10-CM | POA: Diagnosis present

## 2017-12-08 DIAGNOSIS — C787 Secondary malignant neoplasm of liver and intrahepatic bile duct: Secondary | ICD-10-CM | POA: Insufficient documentation

## 2017-12-08 DIAGNOSIS — Z7189 Other specified counseling: Secondary | ICD-10-CM

## 2017-12-08 DIAGNOSIS — Z5112 Encounter for antineoplastic immunotherapy: Secondary | ICD-10-CM | POA: Diagnosis present

## 2017-12-08 DIAGNOSIS — R630 Anorexia: Secondary | ICD-10-CM | POA: Insufficient documentation

## 2017-12-08 DIAGNOSIS — R634 Abnormal weight loss: Secondary | ICD-10-CM | POA: Diagnosis not present

## 2017-12-08 DIAGNOSIS — F419 Anxiety disorder, unspecified: Secondary | ICD-10-CM | POA: Diagnosis not present

## 2017-12-08 DIAGNOSIS — R64 Cachexia: Secondary | ICD-10-CM | POA: Insufficient documentation

## 2017-12-08 DIAGNOSIS — G893 Neoplasm related pain (acute) (chronic): Secondary | ICD-10-CM

## 2017-12-08 DIAGNOSIS — E039 Hypothyroidism, unspecified: Secondary | ICD-10-CM

## 2017-12-08 MED ORDER — LORAZEPAM 1 MG PO TABS: 1.0000 mg | ORAL_TABLET | Freq: Three times a day (TID) | ORAL | 0 refills | Status: DC | PRN

## 2017-12-08 MED ORDER — HYDROMORPHONE HCL 4 MG PO TABS: 4.0000 mg | ORAL_TABLET | ORAL | 0 refills | Status: DC | PRN

## 2017-12-08 MED ORDER — DRONABINOL 2.5 MG PO CAPS: 2.5000 mg | ORAL_CAPSULE | Freq: Two times a day (BID) | ORAL | 0 refills | Status: DC

## 2017-12-08 MED FILL — LORazepam 1 MG TABS: 1 | 20 days supply | Qty: 60 | Fill #0

## 2017-12-08 MED FILL — DRONABINOL 2.5 MG CAPSULE: 2.5 | 30 days supply | Qty: 60 | Fill #0

## 2017-12-08 MED FILL — HYDROmorphone HCL 4 MG TABS: 4 | 10 days supply | Qty: 60 | Fill #0

## 2017-12-08 NOTE — Assessment & Plan Note (Signed)
We reviewed the guidelines Treatment intent is strictly palliative The treatment decision is based on the following publication for patients who tested positive for PD-L1 or MSI high  Pembrolizumab in advanced endometrial cancer: Preliminary results from the phase Ib KEYNOTE-028 study  DOI: 10.1200/JCO.2016.34.15_suppl.5581 Journal of Clinical Oncology 34, no. 15_suppl (Nov 25 2014) 6606-0045.  Published online Nov 16, 2015.  Background: There are currently no treatment options for pts with endometrial cancer who progress on chemotherapy. Pembrolizumab is an anti-PD-1 antibody that blocks interaction between PD-1 and its ligands, PD-L1/PD-L2. The safety and efficacy of pembrolizumab were evaluated in advanced solid tumors in the [place] study ([id number]). Preliminary results from the endometrial carcinoma cohort are presented.   Methods: Key inclusion criteria were advanced endometrial carcinoma (excluded sarcomas, mesenchymal tumors), failure of prior systemic therapy, ECOG PS 0-1, and PD-L1 expression in ? 1% of tumor or stroma cells by IHC. Pembrolizumab 10 mg/kg every 2 wk was given for up to 24 mo or until confirmed progression, intolerable toxicity, death, or consent withdrawal. Response was assessed every 8 wk for the first 6 mo and every 12 wk thereafter. The primary end point was ORR per RECIST v1.1 by investigator assessment.   Results: In the 24 pts enrolled, median age was 97.0 y; 66.7% had an ECOG PS of 1; 62.4% received ? 2 prior therapies for metastatic disease. As of Jun 16, 2014, median follow-up duration was 69.9 wk (range, 5.4-84.4). Treatment-related AEs (TRAEs) occurred in 13 (54.2%) pts; most common were pruritus (n = 4, 16.7%), asthenia, fatigue, pyrexia, and decreased appetite (n = 3 each; 12.5%). Three pts experienced grade 3 TRAEs: 1 pt had back pain and asthenia (both resolved), 1 pt had diarrhea (resolved), 1 pt had asthenia, anemia, hyperglycemia, and hyponatremia (all  unresolved). No pts died or discontinued pembrolizumab because of a TRAE. ORR (confirmed) was 13.0% (PR, n = 3; 95% CI, 2.8-33.6); median duration of response was not yet reached (range, 40.3+ to 65.1+ wk). Three pts achieved stable disease (13%; 95% CI, 2.8-33.6; median duration, 24.6 wk [range, 13.1-24.6]). Six-mo PFS and OS rates were 19.0% and 68.8%, respectively. At data cutoff, all 3 pts with PRs remained on treatment.   Conclusions: Pembrolizumab demonstrated an acceptable safety profile and preliminary antitumor activity in heavily pretreated PD-L1+ advanced endometrial cancer pts. The clinical benefit of pembrolizumab for advanced endometrial cancer is being further investigated in the phase 2 KEYNOTE-158 trial ([id number])  We discussed the importance of TSH monitoring I recommend minimum 3 months of treatment before repeat imaging study Based on other recent publication on flat dose treatment available, I am modifying pembrolizumab to 200 mg flat dose every 3 weeks

## 2017-12-08 NOTE — Assessment & Plan Note (Signed)
She has active cancer pain We discussed chronic pain regimen

## 2017-12-08 NOTE — Telephone Encounter (Signed)
Gave avs and calendar ° °

## 2017-12-08 NOTE — Assessment & Plan Note (Signed)
She has mildly abnormal liver enzymes We will monitor carefully while on treatment

## 2017-12-08 NOTE — Assessment & Plan Note (Signed)
We have extensive discussion about goals of care She understood treatment goal is strictly palliative in nature

## 2017-12-08 NOTE — Progress Notes (Signed)
DISCONTINUE OFF PATHWAY REGIMEN - Uterine   [id number]:Liposomal Doxorubicin (Doxil) 40 mg/m2 q28 Days:   A cycle is every 28 days:     Liposomal doxorubicin   **Always confirm dose/schedule in your pharmacy ordering system**  REASON: Disease Progression PRIOR TREATMENT: Off Pathway: Liposomal Doxorubicin (Doxil) 40 mg/m2 q28 Days TREATMENT RESPONSE: Progressive Disease (PD)  START ON PATHWAY REGIMEN - Uterine     A cycle is 21 days:     Pembrolizumab   **Always confirm dose/schedule in your pharmacy ordering system**  Patient Characteristics: Endometrioid Histology, Recurrent/Progressive Disease, Third Line and Beyond, MSI-H/dMMR Histology: Endometrioid Histology Therapeutic Status: Recurrent or Progressive Disease AJCC T Category: T1 AJCC N Category: N0 AJCC M Category: M1 AJCC 8 Stage Grouping: IVB Line of Therapy: Third Engineer, civil (consulting) Status: MSI-H/dMMR Intent of Therapy: Non-Curative / Palliative Intent, Discussed with Patient

## 2017-12-08 NOTE — Progress Notes (Signed)
[place] OFFICE PROGRESS NOTE  Patient Care Team: [name], MD as PCP - General (Family Medicine)  ASSESSMENT & PLAN:  Recurrent carcinoma of endometrium [facility]) We reviewed the guidelines Treatment intent is strictly palliative The treatment decision is based on the following publication for patients who tested positive for PD-L1 or MSI high  Pembrolizumab in advanced endometrial cancer: Preliminary results from the phase Ib KEYNOTE-028 study  DOI: 10.1200/JCO.2016.34.15_suppl.5581 Journal of Clinical Oncology 34, no. 15_suppl (Nov 25 2014) 8341-9622.  Published online Nov 16, 2015.  Background: There are currently no treatment options for pts with endometrial cancer who progress on chemotherapy. Pembrolizumab is an anti-PD-1 antibody that blocks interaction between PD-1 and its ligands, PD-L1/PD-L2. The safety and efficacy of pembrolizumab were evaluated in advanced solid tumors in the [place] study ([id number]). Preliminary results from the endometrial carcinoma cohort are presented.   Methods: Key inclusion criteria were advanced endometrial carcinoma (excluded sarcomas, mesenchymal tumors), failure of prior systemic therapy, ECOG PS 0-1, and PD-L1 expression in ? 1% of tumor or stroma cells by IHC. Pembrolizumab 10 mg/kg every 2 wk was given for up to 24 mo or until confirmed progression, intolerable toxicity, death, or consent withdrawal. Response was assessed every 8 wk for the first 6 mo and every 12 wk thereafter. The primary end point was ORR per RECIST v1.1 by investigator assessment.   Results: In the 24 pts enrolled, median age was 71.0 y; 66.7% had an ECOG PS of 1; 62.4% received ? 2 prior therapies for metastatic disease. As of Jun 16, 2014, median follow-up duration was 69.9 wk (range, 5.4-84.4). Treatment-related AEs (TRAEs) occurred in 13 (54.2%) pts; most common were pruritus (n = 4, 16.7%), asthenia, fatigue, pyrexia, and decreased appetite (n = 3  each; 12.5%). Three pts experienced grade 3 TRAEs: 1 pt had back pain and asthenia (both resolved), 1 pt had diarrhea (resolved), 1 pt had asthenia, anemia, hyperglycemia, and hyponatremia (all unresolved). No pts died or discontinued pembrolizumab because of a TRAE. ORR (confirmed) was 13.0% (PR, n = 3; 95% CI, 2.8-33.6); median duration of response was not yet reached (range, 40.3+ to 65.1+ wk). Three pts achieved stable disease (13%; 95% CI, 2.8-33.6; median duration, 24.6 wk [range, 13.1-24.6]). Six-mo PFS and OS rates were 19.0% and 68.8%, respectively. At data cutoff, all 3 pts with PRs remained on treatment.   Conclusions: Pembrolizumab demonstrated an acceptable safety profile and preliminary antitumor activity in heavily pretreated PD-L1+ advanced endometrial cancer pts. 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) She has mildly abnormal liver enzymes We will monitor carefully while on treatment  Goals of care, counseling/discussion We have extensive discussion about goals of care She understood treatment goal is strictly palliative in nature   Orders Placed This Encounter  Procedures  . CBC with Differential (Cancer Center Only)    Standing Status:   Standing    Number of Occurrences:   20  Standing Expiration Date:   12/09/2018  . CMP ([place] only)    Standing Status:    Standing    Number of Occurrences:   20    Standing Expiration Date:   12/09/2018  . TSH    Standing Status:   Standing    Number of Occurrences:   9    Standing Expiration Date:   12/09/2018    INTERVAL HISTORY: Please see below for problem oriented charting. She returns for further follow-up with her sister She complained of persistent nausea and difficulties tolerating food She has poor appetite She has lost some weight She is not using regular antiemetics She also complained of left-sided pain that comes and goes She has been seen by orthopedic doctor with plan for MRI scan She has some mild anxiety but no significant depression  SUMMARY OF ONCOLOGIC HISTORY: Oncology History   MSI stable on June 2017 tissue but high from [facility] study from November 2017  05/15/16: ER is moderately positive (70%). PR is strongly positive (80%).     Recurrent carcinoma of endometrium ([place])   01/02/2016 Pathology Results    Uterus +/- tubes/ovaries, neoplastic, cervix ENDOMETRIAL ADENOCARCINOMA, FIGO GRADE 2 (4.7 CM) THE TUMOR INVADES LESS THAN ONE-HALF OF THE MYOMETRIUM (PT1A) ALL MARGINS OF RESECTION ARE NEGATIVE FOR CARCINOMA LEIOMYOMAS AND ADENOMYOSIS BILATERAL FALLOPIAN TUBES AND OVARIES: HISTOLOGICAL UNREMARKABLE 2. Lymph node, sentinel, biopsy, right obturator ONE BENIGN LYMPH NODE (0/1) 3. Lymph nodes, regional resection, left pelvic FOUR BENIGN LYMPH NODES (0/4)      01/02/2016 Surgery    Dr. Denman George performed robotic-assisted laparoscopic total hysterectomy with bilateral salpingoophorectomy, sentinel lymph node biopsy, lymphadenectomy        05/15/2016 Pathology Results    Vagina, biopsy, mid - ADENOCARCINOMA, SEE COMMENT. Microscopic Comment The morphology along with the patient's history are consistent with recurrent endometrioid adenocarcinoma. The carcinoma has a similar appearance to the primary ([id number]).      05/20/2016 Imaging    Ct scan abdomen showed  solid 2.5 cm peritoneal mass in the mid to anterior left pelvis, suspicious for peritoneal metastasis. 2. Small expansile low-attenuation filling defect in the left external iliac vein, cannot exclude a small deep venous thrombus. Consider correlation with left lower extremity venous Doppler scan. 3. Small simple fluid density structure in the left pelvic sidewall abutting the left external iliac vessels, favor a small postoperative seroma. 4. No ascites. 5. No lymphadenopathy.  No metastatic disease in the chest. 6. Aortic atherosclerosis.      06/12/2016 PET scan    Intensely hypermetabolic 2.1 cm central pelvic peritoneal mass just to the left of midline, consistent with peritoneal metastatic recurrence. No ascites. 2. No additional hypermetabolic sites of metastatic disease. 3. Diffuse thyroid hypermetabolism without discrete thyroid nodule, favoring thyroiditis. Recommend correlation with serum thyroid function tests.      06/24/2016 - 07/22/2016 Chemotherapy    The patient had weekly cisplatin. She has missed several doses due to infection and pancytopenia      06/24/2016 - 09/04/2016 Radiation Therapy    She completed concurrent radiation therapy Radiation treatment dates:   IMRT : 06/24/16 - 08/01/16 HDR : 08/13/16, 08/20/16, 08/27/16, 09/04/16  Site/dose:   Pelvis treated to 55 Gy in 25 fractions (simultaneous integrated boost technique) Vaginal Cuff treated to 24 Gy in 4 fractions      08/15/2016 - 12/03/2016 Chemotherapy    She received 6 cycles of carboplatin/Taxol      09/27/2016 Imaging    Interval decrease  in size of previously described solid peritoneal nodule within the left anterior pelvis. Near complete resolution of previously described low-attenuation structure along the left pelvic sidewall. No evidence for metastatic disease in the chest. Aortic atherosclerosis.      12/30/2016 Imaging    Ct abdomen 1. Solitary left pelvic peritoneal implant is mildly decreased in size in the  interval. 2. No new or progressive metastatic disease in the abdomen or pelvis. No ascites. 3. Aortic atherosclerosis.      04/02/2017 Imaging    Left lower quadrant peritoneal implant referenced on previous exam measures 1.7 x 1.3 cm, image 69 of series 2. Increased from 0.8 x 0.8 cm previously peer no new peritoneal implants identified.  Musculoskeletal: The degenerative disc disease noted within the lumbar spine.  IMPRESSION: 1. Solitary left pelvic peritoneal implant is increased in size in the interval. 2. No new sites of disease.  No ascites. 3. Aortic atherosclerosis      04/11/2017 PET scan    1. The left side of pelvis peritoneal implant has decreased in size and degree of FDG uptake compatible with response to therapy. No new areas of peritoneal disease identified. 2. Persistent diffuse increased uptake within the thyroid gland. Correlation with patient's thyroid function may be helpful.      05/26/2017 Imaging    1. Enlarging tumor implant along the left adnexa, currently 2.6 by 2.4 cm and previously 1.9 by 1.3 cm. No new tumor implant or other specific cause for the patient's pelvic symptoms is currently identified. 2.  Aortic Atherosclerosis (ICD10-I70.0). 3. Lumbar spondylosis and degenerative disc disease causing multilevel impingement.      06/04/2017 - 08/28/2017 Chemotherapy    The patient started letrozole and everolimus      08/26/2017 Imaging    Increased size of mass in the left adnexal region.  Several new small liver metastases in right hepatic lobe      09/08/2017 Imaging    LV EF: 60% -  65%      09/10/2017 Procedure    Technically successful right IJ power-injectable port catheter placement. Ready for routine use      12/04/2017 Imaging    1. Interval increase in size and number of multiple lesions within the liver compatible with hepatic metastatic disease. 2. Interval increase in size mass within the left hemipelvis.      12/08/2017 -   Chemotherapy    The patient had pembrolizumab (KEYTRUDA) 200 mg in sodium chloride 0.9 % 50 mL chemo infusion, 200 mg, Intravenous, Once, 0 of 6 cycles  for chemotherapy treatment.        REVIEW OF SYSTEMS:   Constitutional: Denies fevers, chills Eyes: Denies blurriness of vision Ears, nose, mouth, throat, and face: Denies mucositis or sore throat Respiratory: Denies cough, dyspnea or wheezes Cardiovascular: Denies palpitation, chest discomfort or lower extremity swelling Skin: Denies abnormal skin rashes Lymphatics: Denies new lymphadenopathy or easy bruising Neurological:Denies numbness, tingling or new weaknesses Behavioral/Psych: Mood is stable, no new changes  All other systems were reviewed with the patient and are negative.  I have reviewed the past medical history, past surgical history, social history and family history with the patient and they are unchanged from previous note.  ALLERGIES:  is allergic to latex.  MEDICATIONS:  Current Outpatient Medications  Medication Sig Dispense Refill  . acetaminophen (TYLENOL) 325 MG tablet Take 650 mg by mouth every 6 (six) hours as needed.    Marland Kitchen amoxicillin (AMOXIL) 500 MG capsule Take 2,000 mg by  mouth See admin instructions. Prior to dental appointment, takes 4 tables prior to appt    . benzonatate (TESSALON) 200 MG capsule TAKE ONE CAPSULE 3 TIMES A DAY AS NEEDED FOR COUGH  2  . Biotin w/ Vitamins C & E (HAIR/SKIN/NAILS PO) Take 1 tablet by mouth daily.    . Calcium Carb-Cholecalciferol (CALCIUM 600 + D PO) Take 2 tablets by mouth daily.    . celecoxib (CELEBREX) 200 MG capsule Take 1 capsule twice a day with food as needed for chest wall pain.    . chlorhexidine (PERIDEX) 0.12 % solution Use as directed 15 mLs in the mouth or throat 2 (two) times daily. (Patient not taking: Reported on 07/07/2017) 120 mL 0  . dronabinol (MARINOL) 2.5 MG capsule Take 1 capsule (2.5 mg total) by mouth 2 (two) times daily before lunch and supper. 60  capsule 0  . fluticasone (FLONASE) 50 MCG/ACT nasal spray Place 1 spray into both nostrils 2 (two) times daily as needed for allergies.   1  . gabapentin (NEURONTIN) 600 MG tablet TAKE 1 TABLET BY MOUTH TWICE A DAY (Patient taking differently: Take 1 tablet 2 times a day) 60 tablet 9  . guaiFENesin-codeine 100-10 MG/5ML syrup Take 1 or two teaspoons at bedtime as needed for cough. May repeat during the night.    Marland Kitchen HYDROmorphone (DILAUDID) 4 MG tablet Take 1 tablet (4 mg total) by mouth every 4 (four) hours as needed for severe pain. 60 tablet 0  . ibuprofen (ADVIL,MOTRIN) 200 MG tablet Take 400 mg by mouth every 8 (eight) hours as needed for mild pain.    Marland Kitchen levothyroxine (SYNTHROID, LEVOTHROID) 88 MCG tablet Take 88 mcg by mouth daily.  3  . loratadine (CLARITIN) 10 MG tablet Take 10 mg by mouth daily.    Marland Kitchen LORazepam (ATIVAN) 1 MG tablet Take 1 tablet (1 mg total) by mouth every 8 (eight) hours as needed for anxiety or sleep. 60 tablet 0  . Magnesium 250 MG TABS Take 250 mg by mouth daily.    . Melatonin 10 MG CAPS Take 10 mg by mouth at bedtime.    . ondansetron (ZOFRAN) 8 MG tablet Take 1 tablet (8 mg total) by mouth every 8 (eight) hours as needed for nausea. 60 tablet 1  . traZODone (DESYREL) 50 MG tablet Take 1.5 tablets (75 mg total) by mouth at bedtime. 135 tablet 1   No current facility-administered medications for this visit.     PHYSICAL EXAMINATION: ECOG PERFORMANCE STATUS: 2 - Symptomatic, <50% confined to bed  Vitals:   12/08/17 1147  BP: 129/68  Pulse: 77  Resp: 18  Temp: 98.4 F (36.9 C)  SpO2: 99%   Filed Weights   12/08/17 1147  Weight: 191 lb 1.6 oz (86.7 kg)    GENERAL:alert, no distress and comfortable SKIN: skin color, texture, turgor are normal, no rashes or significant lesions NEURO: alert & oriented x 3 with fluent speech, no focal motor/sensory deficits  LABORATORY DATA:  I have reviewed the data as listed    Component Value Date/Time   NA 137  12/03/2017 1355   NA 141 07/09/2017 1312   K 3.8 12/03/2017 1355   K 3.9 07/09/2017 1312   CL 101 12/03/2017 1355   CO2 24 12/03/2017 1355   CO2 27 07/09/2017 1312   GLUCOSE 90 12/03/2017 1355   GLUCOSE 109 07/09/2017 1312   BUN 19 12/03/2017 1355   BUN 16.4 07/09/2017 1312   CREATININE 0.71 12/03/2017 1355  CREATININE 0.8 07/09/2017 1312   CALCIUM 10.2 12/03/2017 1355   CALCIUM 9.2 07/09/2017 1312   PROT 7.3 12/03/2017 1355   PROT 7.1 07/09/2017 1312   ALBUMIN 3.1 (L) 12/03/2017 1355   ALBUMIN 3.1 (L) 07/09/2017 1312   AST 16 12/03/2017 1355   AST 35 (H) 07/09/2017 1312   ALT 13 12/03/2017 1355   ALT 43 07/09/2017 1312   ALKPHOS 205 (H) 12/03/2017 1355   ALKPHOS 182 (H) 07/09/2017 1312   BILITOT 0.3 12/03/2017 1355   BILITOT 0.34 07/09/2017 1312   GFRNONAA >60 12/03/2017 1355   GFRAA >60 12/03/2017 1355    No results found for: SPEP, UPEP  Lab Results  Component Value Date   WBC 5.8 12/03/2017   NEUTROABS 4.3 12/03/2017   HGB 8.9 (L) 12/03/2017   HCT 27.8 (L) 12/03/2017   MCV 81.8 12/03/2017   PLT 314 12/03/2017      Chemistry      Component Value Date/Time   NA 137 12/03/2017 1355   NA 141 07/09/2017 1312   K 3.8 12/03/2017 1355   K 3.9 07/09/2017 1312   CL 101 12/03/2017 1355   CO2 24 12/03/2017 1355   CO2 27 07/09/2017 1312   BUN 19 12/03/2017 1355   BUN 16.4 07/09/2017 1312   CREATININE 0.71 12/03/2017 1355   CREATININE 0.8 07/09/2017 1312      Component Value Date/Time   CALCIUM 10.2 12/03/2017 1355   CALCIUM 9.2 07/09/2017 1312   ALKPHOS 205 (H) 12/03/2017 1355   ALKPHOS 182 (H) 07/09/2017 1312   AST 16 12/03/2017 1355   AST 35 (H) 07/09/2017 1312   ALT 13 12/03/2017 1355   ALT 43 07/09/2017 1312   BILITOT 0.3 12/03/2017 1355   BILITOT 0.34 07/09/2017 1312       RADIOGRAPHIC STUDIES: I have reviewed multiple imaging study with the patient I have personally reviewed the radiological images as listed and agreed with the findings in the  report. Ct Abdomen Pelvis W Contrast  Result Date: 12/04/2017 CLINICAL DATA:  Patient with history endometrial carcinoma with hepatic metastatic disease. EXAM: CT ABDOMEN AND PELVIS WITH CONTRAST TECHNIQUE: Multidetector CT imaging of the abdomen and pelvis was performed using the standard protocol following bolus administration of intravenous contrast. CONTRAST:  125m ISOVUE-300 IOPAMIDOL (ISOVUE-300) INJECTION 61% COMPARISON:  CT abdomen pelvis 08/26/2017. FINDINGS: Lower chest: Normal heart size. Dependent atelectasis within the bilateral lower lobes. No pleural effusion. Hepatobiliary: Interval increase in size of multiple liver lesions. Reference 2.0 x 1.8 cm lesion within the hepatic dome (image 10; series 2), previously 0.5 x 0.5 cm. Reference lesion within the right hepatic lobe measures 2.7 x 3.4 cm (image 30; series 2), previously 1.2 x 1.0 cm. There is a new 1.8 x 2.6 cm lesion adjacent to the intrahepatic IVC within the right hepatic lobe (image 22; series 2). Pancreas: Unremarkable Spleen: Unremarkable Adrenals/Urinary Tract: Adrenal glands are normal. Kidneys enhance symmetrically with contrast. There is moderate left hydroureteronephrosis to the level of an enhancing pelvic mass (image 67; series 2). Urinary bladder is decompressed. Stomach/Bowel: Lateral aspect of the sigmoid colon is in direct contact with enhancing enlarging left hemi pelvic mass. No evidence for bowel obstruction. Normal appendix. Small hiatal hernia. Normal morphology of the stomach. Vascular/Lymphatic: Normal caliber abdominal aorta. Peripheral calcified atherosclerotic plaque. No retroperitoneal lymphadenopathy. Reproductive: Patient status post hysterectomy. Other: Interval increase in size of enhancing mass within the left hemipelvis measuring 5.1 x 3.8 cm (image 68; series 2), previously 3.6 x  3.5 cm. Musculoskeletal: Lumbar spine degenerative changes. No aggressive or acute appearing osseous lesions. IMPRESSION: 1.  Interval increase in size and number of multiple lesions within the liver compatible with hepatic metastatic disease. 2. Interval increase in size mass within the left hemipelvis. Electronically Signed   By: Lovey Newcomer M.D.   On: 12/04/2017 09:26    All questions were answered. The patient knows to call the clinic with any problems, questions or concerns. No barriers to learning was detected.  I spent 40 minutes counseling the patient face to face. The total time spent in the appointment was 55 minutes and more than 50% was on counseling and review of test results  Heath Lark, MD 12/08/2017 1:22 PM

## 2017-12-08 NOTE — Assessment & Plan Note (Signed)
She has poorly controlled chronic nausea We discussed the use of regular antiemetics We also discussed the use of Marinol

## 2017-12-10 ENCOUNTER — Other Ambulatory Visit: Payer: Federal, State, Local not specified - PPO

## 2017-12-10 ENCOUNTER — Ambulatory Visit (HOSPITAL_COMMUNITY): Payer: Federal, State, Local not specified - PPO

## 2017-12-11 ENCOUNTER — Ambulatory Visit: Payer: Federal, State, Local not specified - PPO | Admitting: Hematology and Oncology

## 2017-12-11 ENCOUNTER — Ambulatory Visit: Payer: Federal, State, Local not specified - PPO

## 2017-12-15 ENCOUNTER — Inpatient Hospital Stay: Payer: Medicare Other

## 2017-12-15 VITALS — BP 110/71 | HR 66 | Resp 18

## 2017-12-15 DIAGNOSIS — Z5112 Encounter for antineoplastic immunotherapy: Secondary | ICD-10-CM | POA: Diagnosis not present

## 2017-12-15 DIAGNOSIS — C541 Malignant neoplasm of endometrium: Secondary | ICD-10-CM

## 2017-12-15 DIAGNOSIS — E039 Hypothyroidism, unspecified: Secondary | ICD-10-CM

## 2017-12-15 LAB — CBC WITH DIFFERENTIAL (CANCER CENTER ONLY)
BASOS PCT: 0 %
Basophils Absolute: 0 10*3/uL (ref 0.0–0.1)
Eosinophils Absolute: 0.1 10*3/uL (ref 0.0–0.5)
Eosinophils Relative: 1 %
HEMATOCRIT: 28 % — AB (ref 34.8–46.6)
HEMOGLOBIN: 8.8 g/dL — AB (ref 11.6–15.9)
LYMPHS PCT: 9 %
Lymphs Abs: 0.7 10*3/uL — ABNORMAL LOW (ref 0.9–3.3)
MCH: 25.7 pg (ref 25.1–34.0)
MCHC: 31.4 g/dL — AB (ref 31.5–36.0)
MCV: 81.9 fL (ref 79.5–101.0)
MONO ABS: 0.7 10*3/uL (ref 0.1–0.9)
MONOS PCT: 10 %
NEUTROS ABS: 6.3 10*3/uL (ref 1.5–6.5)
NEUTROS PCT: 80 %
Platelet Count: 273 10*3/uL (ref 145–400)
RBC: 3.42 MIL/uL — ABNORMAL LOW (ref 3.70–5.45)
RDW: 17.5 % — AB (ref 11.2–14.5)
WBC Count: 7.8 10*3/uL (ref 3.9–10.3)

## 2017-12-15 LAB — CMP (CANCER CENTER ONLY)
ALBUMIN: 2.8 g/dL — AB (ref 3.5–5.0)
ALK PHOS: 308 U/L — AB (ref 40–150)
ALT: 17 U/L (ref 0–55)
ANION GAP: 8 (ref 3–11)
AST: 18 U/L (ref 5–34)
BUN: 27 mg/dL — AB (ref 7–26)
CALCIUM: 10.1 mg/dL (ref 8.4–10.4)
CO2: 26 mmol/L (ref 22–29)
Chloride: 103 mmol/L (ref 98–109)
Creatinine: 0.85 mg/dL (ref 0.60–1.10)
GFR, Est AFR Am: >60 mL/min (ref >60)
GLUCOSE: 121 mg/dL (ref 70–140)
POTASSIUM: 4.2 mmol/L (ref 3.5–5.1)
Sodium: 137 mmol/L (ref 136–145)
TOTAL PROTEIN: 6.9 g/dL (ref 6.4–8.3)
Total Bilirubin: 0.3 mg/dL (ref 0.2–1.2)

## 2017-12-15 LAB — TSH: TSH: 1.496 u[IU]/mL (ref 0.308–3.960)

## 2017-12-15 MED ORDER — SODIUM CHLORIDE 0.9% FLUSH
10.0000 mL | INTRAVENOUS | Status: DC | PRN
  Administered 2017-12-15: 10 mL
  Filled 2017-12-15: qty 10

## 2017-12-15 MED ORDER — HEPARIN SOD (PORK) LOCK FLUSH 100 UNIT/ML IV SOLN
500.0000 [IU] | Freq: Once | INTRAVENOUS | Status: AC | PRN
  Administered 2017-12-15: 500 [IU]
  Filled 2017-12-15: qty 5

## 2017-12-15 MED ORDER — SODIUM CHLORIDE 0.9% FLUSH
10.0000 mL | Freq: Once | INTRAVENOUS | Status: AC
  Administered 2017-12-15: 10 mL
  Filled 2017-12-15: qty 10

## 2017-12-15 MED ORDER — SODIUM CHLORIDE 0.9 % IV SOLN
200.0000 mg | Freq: Once | INTRAVENOUS | Status: AC
  Administered 2017-12-15: 200 mg via INTRAVENOUS
  Filled 2017-12-15: qty 8

## 2017-12-15 MED ORDER — SODIUM CHLORIDE 0.9 % IV SOLN
Freq: Once | INTRAVENOUS | Status: AC
  Administered 2017-12-15: 14:00:00 via INTRAVENOUS

## 2017-12-15 NOTE — Patient Instructions (Signed)
[facility] Discharge Instructions for Patients Receiving Chemotherapy  Today you received the following chemotherapy agents :  Keytruda.  To help prevent nausea and vomiting after your treatment, we encourage you to take your nausea medication as prescribed.   If you develop nausea and vomiting that is not controlled by your nausea medication, call the clinic.   BELOW ARE SYMPTOMS THAT SHOULD BE REPORTED IMMEDIATELY:  *FEVER GREATER THAN 100.5 F  *CHILLS WITH OR WITHOUT FEVER  NAUSEA AND VOMITING THAT IS NOT CONTROLLED WITH YOUR NAUSEA MEDICATION  *UNUSUAL SHORTNESS OF BREATH  *UNUSUAL BRUISING OR BLEEDING  TENDERNESS IN MOUTH AND THROAT WITH OR WITHOUT PRESENCE OF ULCERS  *URINARY PROBLEMS  *BOWEL PROBLEMS  UNUSUAL RASH Items with * indicate a potential emergency and should be followed up as soon as possible.  Feel free to call the clinic should you have any questions or concerns. The clinic phone number is [phone].  Please show the CHEMO ALERT CARD at check-in to the Emergency Department and triage nurse.   Pembrolizumab injection What is this medicine? PEMBROLIZUMAB (pem broe liz ue mab) is a monoclonal antibody. It is used to treat melanoma, head and neck cancer, Hodgkin lymphoma, non-small cell lung cancer, urothelial cancer, stomach cancer, and cancers that have a certain genetic condition. This medicine may be used for other purposes; ask your health care provider or pharmacist if you have questions. COMMON BRAND NAME(S): Keytruda What should I tell my health care provider before I take this medicine? They need to know if you have any of these conditions: -diabetes -immune system problems -inflammatory bowel disease -liver disease -lung or breathing disease -lupus -organ transplant -an unusual or allergic reaction to pembrolizumab, other medicines, foods, dyes, or preservatives -pregnant or trying to get pregnant -breast-feeding How  should I use this medicine? This medicine is for infusion into a vein. It is given by a health care professional in a hospital or clinic setting. A special MedGuide will be given to you before each treatment. Be sure to read this information carefully each time. Talk to your pediatrician regarding the use of this medicine in children. While this drug may be prescribed for selected conditions, precautions do apply. Overdosage: If you think you have taken too much of this medicine contact a poison control center or emergency room at once. NOTE: This medicine is only for you. Do not share this medicine with others. What if I miss a dose? It is important not to miss your dose. Call your doctor or health care professional if you are unable to keep an appointment. What may interact with this medicine? Interactions have not been studied. Give your health care provider a list of all the medicines, herbs, non-prescription drugs, or dietary supplements you use. Also tell them if you smoke, drink alcohol, or use illegal drugs. Some items may interact with your medicine. This list may not describe all possible interactions. Give your health care provider a list of all the medicines, herbs, non-prescription drugs, or dietary supplements you use. Also tell them if you smoke, drink alcohol, or use illegal drugs. Some items may interact with your medicine. What should I watch for while using this medicine? Your condition will be monitored carefully while you are receiving this medicine. You may need blood work done while you are taking this medicine. Do not become pregnant while taking this medicine or for 4 months after stopping it. Women should inform their doctor if they wish to become pregnant or   think they might be pregnant. There is a potential for serious side effects to an unborn child. Talk to your health care professional or pharmacist for more information. Do not breast-feed an infant while taking this  medicine or for 4 months after the last dose. What side effects may I notice from receiving this medicine? Side effects that you should report to your doctor or health care professional as soon as possible: -allergic reactions like skin rash, itching or hives, swelling of the face, lips, or tongue -bloody or black, tarry -breathing problems -changes in vision -chest pain -chills -constipation -cough -dizziness or feeling faint or lightheaded -fast or irregular heartbeat -fever -flushing -hair loss -low blood counts - this medicine may decrease the number of white blood cells, red blood cells and platelets. You may be at increased risk for infections and bleeding. -muscle pain -muscle weakness -persistent headache -signs and symptoms of high blood sugar such as dizziness; dry mouth; dry skin; fruity breath; nausea; stomach pain; increased hunger or thirst; increased urination -signs and symptoms of kidney injury like trouble passing urine or change in the amount of urine -signs and symptoms of liver injury like dark urine, light-colored stools, loss of appetite, nausea, right upper belly pain, yellowing of the eyes or skin -stomach pain -sweating -weight loss Side effects that usually do not require medical attention (report to your doctor or health care professional if they continue or are bothersome): -decreased appetite -diarrhea -tiredness This list may not describe all possible side effects. Call your doctor for medical advice about side effects. You may report side effects to FDA at 1-800-FDA-1088. Where should I keep my medicine? This drug is given in a hospital or clinic and will not be stored at home. NOTE: This sheet is a summary. It may not cover all possible information. If you have questions about this medicine, talk to your doctor, pharmacist, or health care provider.  2018 Elsevier/Gold Standard (2016-04-02 12:29:36)    

## 2017-12-18 ENCOUNTER — Telehealth: Payer: Self-pay

## 2017-12-18 NOTE — Telephone Encounter (Signed)
Given below message to Dr. Alvy Bimler. Called her back, per Dr. Alvy Bimler. Told her Dr. Alvy Bimler talked with Dr. Lurene Shadow at [facility] last week. The scan did not show cancer of the left kidney, it showed hydronephrosis. Offered her a earlier appt to see Dr. Alvy Bimler, she declined. Instructed to call for questions or concerns. She verbalized understanding.

## 2017-12-18 NOTE — Telephone Encounter (Signed)
She called and left a message to call her.  Called back. She was told to call. She had a MRI on 6/4 ordered by Dr. Lurene Shadow at [facility] on [facility]. The MRI showed Cancer in the left kidney and lesions of the liver.

## 2017-12-21 ENCOUNTER — Encounter (HOSPITAL_COMMUNITY): Payer: Self-pay | Admitting: Nurse Practitioner

## 2017-12-21 ENCOUNTER — Emergency Department (HOSPITAL_COMMUNITY): Payer: Medicare Other

## 2017-12-21 ENCOUNTER — Emergency Department (HOSPITAL_COMMUNITY)
Admission: EM | Admit: 2017-12-21 | Discharge: 2017-12-22 | Disposition: A | Discharge status: Home / Self Care | Payer: Medicare Other | Attending: Emergency Medicine | Admitting: Emergency Medicine

## 2017-12-21 DIAGNOSIS — Z96652 Presence of left artificial knee joint: Secondary | ICD-10-CM | POA: Insufficient documentation

## 2017-12-21 DIAGNOSIS — E039 Hypothyroidism, unspecified: Secondary | ICD-10-CM | POA: Diagnosis not present

## 2017-12-21 DIAGNOSIS — Z79899 Other long term (current) drug therapy: Secondary | ICD-10-CM | POA: Diagnosis not present

## 2017-12-21 DIAGNOSIS — C541 Malignant neoplasm of endometrium: Secondary | ICD-10-CM | POA: Diagnosis not present

## 2017-12-21 DIAGNOSIS — I10 Essential (primary) hypertension: Secondary | ICD-10-CM | POA: Diagnosis not present

## 2017-12-21 DIAGNOSIS — Z91040 Latex allergy status: Secondary | ICD-10-CM | POA: Insufficient documentation

## 2017-12-21 DIAGNOSIS — N131 Hydronephrosis with ureteral stricture, not elsewhere classified: Secondary | ICD-10-CM | POA: Insufficient documentation

## 2017-12-21 DIAGNOSIS — R1012 Left upper quadrant pain: Secondary | ICD-10-CM | POA: Diagnosis present

## 2017-12-21 LAB — URINALYSIS, ROUTINE W REFLEX MICROSCOPIC
BILIRUBIN URINE: NEGATIVE
Bacteria, UA: NONE SEEN
GLUCOSE, UA: NEGATIVE mg/dL
KETONES UR: NEGATIVE mg/dL
Leukocytes, UA: NEGATIVE
NITRITE: NEGATIVE
PH: 6 (ref 5.0–8.0)
Protein, ur: NEGATIVE mg/dL
SPECIFIC GRAVITY, URINE: 1.01 (ref 1.005–1.030)

## 2017-12-21 LAB — COMPREHENSIVE METABOLIC PANEL
ALBUMIN: 3.6 g/dL (ref 3.5–5.0)
ALK PHOS: 198 U/L — AB (ref 38–126)
ALT: 15 U/L (ref 14–54)
AST: 16 U/L (ref 15–41)
Anion gap: 9 (ref 5–15)
BILIRUBIN TOTAL: 0.4 mg/dL (ref 0.3–1.2)
BUN: 22 mg/dL — AB (ref 6–20)
CO2: 24 mmol/L (ref 22–32)
Calcium: 10.1 mg/dL (ref 8.9–10.3)
Chloride: 103 mmol/L (ref 101–111)
Creatinine, Ser: 0.82 mg/dL (ref 0.44–1.00)
GFR calc Af Amer: >60 mL/min (ref >60)
GFR calc non Af Amer: >60 mL/min (ref >60)
GLUCOSE: 117 mg/dL — AB (ref 65–99)
POTASSIUM: 3.8 mmol/L (ref 3.5–5.1)
Sodium: 136 mmol/L (ref 135–145)
TOTAL PROTEIN: 7.8 g/dL (ref 6.5–8.1)

## 2017-12-21 LAB — CBC WITH DIFFERENTIAL/PLATELET
BASOS ABS: 0 10*3/uL (ref 0.0–0.1)
Basophils Relative: 0 %
Eosinophils Absolute: 0.1 10*3/uL (ref 0.0–0.7)
Eosinophils Relative: 1 %
HEMATOCRIT: 32.7 % — AB (ref 36.0–46.0)
Hemoglobin: 10.5 g/dL — ABNORMAL LOW (ref 12.0–15.0)
LYMPHS ABS: 1.2 10*3/uL (ref 0.7–4.0)
Lymphocytes Relative: 11 %
MCH: 26.2 pg (ref 26.0–34.0)
MCHC: 32.1 g/dL (ref 30.0–36.0)
MCV: 81.5 fL (ref 78.0–100.0)
MONOS PCT: 10 %
Monocytes Absolute: 1.1 10*3/uL — ABNORMAL HIGH (ref 0.1–1.0)
Neutro Abs: 8.4 10*3/uL — ABNORMAL HIGH (ref 1.7–7.7)
Neutrophils Relative %: 78 %
Platelets: 296 10*3/uL (ref 150–400)
RBC: 4.01 MIL/uL (ref 3.87–5.11)
RDW: 16.9 % — AB (ref 11.5–15.5)
WBC: 10.8 10*3/uL — ABNORMAL HIGH (ref 4.0–10.5)

## 2017-12-21 MED ORDER — SODIUM CHLORIDE 0.9 % IV SOLN
INTRAVENOUS | Status: DC
  Administered 2017-12-21: 21:00:00 via INTRAVENOUS

## 2017-12-21 MED ORDER — SODIUM CHLORIDE 0.9 % IV BOLUS: 500.0000 mL | Freq: Once | INTRAVENOUS | Status: DC

## 2017-12-21 MED ORDER — HYDROMORPHONE HCL 1 MG/ML IJ SOLN
1.0000 mg | Freq: Once | INTRAMUSCULAR | Status: AC
  Administered 2017-12-21: 1 mg via INTRAVENOUS
  Filled 2017-12-21: qty 1

## 2017-12-21 MED ORDER — SODIUM CHLORIDE 0.9 % IV SOLN: INTRAVENOUS | Status: DC

## 2017-12-21 MED ORDER — ONDANSETRON HCL 4 MG/2ML IJ SOLN
4.0000 mg | Freq: Once | INTRAMUSCULAR | Status: AC
  Administered 2017-12-21: 4 mg via INTRAVENOUS
  Filled 2017-12-21: qty 2

## 2017-12-21 MED ORDER — KETOROLAC TROMETHAMINE 30 MG/ML IJ SOLN
30.0000 mg | Freq: Once | INTRAMUSCULAR | Status: AC
  Administered 2017-12-21: 30 mg via INTRAVENOUS
  Filled 2017-12-21: qty 1

## 2017-12-21 NOTE — Discharge Instructions (Addendum)
The pain in your left flank appears to be secondary to pressure on the left ureter which is causing it in the kidney to swell.  Follow-up with your oncologist as soon as possible to discuss further care options and treatment.  In the meantime take your hydromorphone every 4 hours as needed for pain.  You can also try using ibuprofen 4 to 600 mg every 6 hours as needed to help the discomfort.

## 2017-12-21 NOTE — ED Notes (Signed)
Pt aware that urine sample is needed. Pt unable at this time.  RN notified.

## 2017-12-21 NOTE — ED Provider Notes (Signed)
[facility] DEPT Provider Note   CSN: [id number] Arrival date & time: 12/21/17  1926     History   Chief Complaint Chief Complaint  Patient presents with  . Back Pain    CVA/Flank Pain  . Ca Pt    HPI Michelle Rosario is a 72 y.o. female.  HPI   She presents for evaluation of left flank pain, present for several days and worsening.  She states that she was recently diagnosed with "inflammation of the left kidney."  Records in epic indicate that she has left hydronephrosis, found during CT imaging by orthopedics, several days ago.  Apparently she has been seeing orthopedics for chronic back pain typified by right-sided sciatica.  Patient is undergoing chemotherapy.  She has endometrial cancer.  She denies dysuria, urinary frequency, fever, chills, nausea, vomiting, weakness or dizziness.  She has taken hydromorphone orally and Tylenol twice today each, without relief.  No prior history of nephrolithiasis.  There are no other no modifying factors.  Past Medical History:  Diagnosis Date  . Broken ribs    hx of  . Family history of adverse reaction to anesthesia    mother post op N&V  . History of radiation therapy 06/24/16-08/01/16 and 08/13/16, 08/20/16, 08/27/16, 09/04/16   pelvis treated to 55 Gy in 25 fractions, vaginal cuff treated to 24 Gy in 4 fractions  . Hypertension   . Hypothyroidism   . Uterine cancer [facility])    May 30 , 2017    Patient Active Problem List   Diagnosis Date Noted  . Iron deficiency anemia 11/13/2017  . Encounter for antineoplastic chemotherapy 09/01/2017  . Chronic nausea 08/29/2017  . Metastasis to liver ([place]) 08/29/2017  . Hot flashes 06/12/2017  . 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 Cancer associated pain 06/12/2017  . Goals of care, counseling/discussion 05/28/2017  . 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 Anemia due to  antineoplastic chemotherapy 09/30/2016  . Urinary incontinence, overflow 09/30/2016  . Restless leg syndrome 09/19/2016  . Deficiency anemia 09/19/2016  . Superficial thrombophlebitis of cephalic vein 69/48/5462  . Cellulitis of right upper extremity 08/29/2016  . Pancytopenia, acquired ([place]) 08/15/2016  . Thoracic aorta atherosclerosis ([place]) 05/29/2016  . Hypothyroidism 05/29/2016  . Essential hypertension 05/29/2016  . Recurrent carcinoma of endometrium ([place]) 01/02/2016    Past Surgical History:  Procedure Laterality Date  . achellis tendon repaired in 2005    . ANKLE SURGERY Right Oct 2006  . fisure  2014  . IR FLUORO GUIDE PORT INSERTION RIGHT  09/10/2017  . IR US GUIDE VASC ACCESS RIGHT  09/10/2017  . LYMPH NODE BIOPSY N/A 01/02/2016   Procedure: SENTINAL LYMPH NODE BIOPSY;  Surgeon: Everitt Amber, MD;  Location: [facility] ORS;  Service: Gynecology;  Laterality: N/A;  . rectal fissure surgery 2010    . REPLACEMENT TOTAL KNEE Left 2015  . ROBOTIC ASSISTED TOTAL HYSTERECTOMY WITH BILATERAL SALPINGO OOPHERECTOMY Bilateral 01/02/2016   Procedure: XI ROBOTIC ASSISTED TOTAL LAPAROSCOPIC HYSTERECTOMY WITH BILATERAL SALPINGO OOPHORECTOMY;  Surgeon: Everitt Amber, MD;  Location: [facility] ORS;  Service: Gynecology;  Laterality: Bilateral;  . TUBAL LIGATION       OB History   None      Home Medications    Prior to Admission medications   Medication Sig Start Date End Date Taking? Authorizing Provider  acetaminophen (TYLENOL) 325 MG tablet Take 650 mg by mouth every 6 (six) hours as needed  for mild pain or moderate pain.    Yes [provider]  amoxicillin (AMOXIL) 500 MG capsule Take 2,000 mg by mouth See admin instructions. Prior to dental appointment, takes 4 tables prior to appt 11/04/16  Yes [provider]  Biotin w/ Vitamins C & E (HAIR/SKIN/NAILS PO) Take 1 tablet by mouth daily.   Yes [provider]  Calcium Carb-Cholecalciferol (CALCIUM 600 + D PO) Take 2 tablets by  mouth daily.   Yes [provider]  celecoxib (CELEBREX) 200 MG capsule Take 1 capsule twice a day with food as needed for chest wall pain. 10/09/17  Yes [provider]  dronabinol (MARINOL) 2.5 MG capsule Take 1 capsule (2.5 mg total) by mouth 2 (two) times daily before lunch and supper. 12/08/17  Yes Gorsuch, Ni, MD  fluticasone (FLONASE) 50 MCG/ACT nasal spray Place 1 spray into both nostrils 2 (two) times daily as needed for allergies.  06/10/16  Yes [provider]  gabapentin (NEURONTIN) 600 MG tablet TAKE 1 TABLET BY MOUTH TWICE A DAY Patient taking differently: Take 1 tablet 2 times a day 10/10/16  Yes Gorsuch, Ni, MD  HYDROmorphone (DILAUDID) 4 MG tablet Take 1 tablet (4 mg total) by mouth every 4 (four) hours as needed for severe pain. 12/08/17  Yes Gorsuch, Ni, MD  ibuprofen (ADVIL,MOTRIN) 200 MG tablet Take 400 mg by mouth every 8 (eight) hours as needed for mild pain.   Yes [provider]  levothyroxine (SYNTHROID, LEVOTHROID) 88 MCG tablet Take 88 mcg by mouth daily. 08/12/16  Yes [provider]  loratadine (CLARITIN) 10 MG tablet Take 10 mg by mouth daily.   Yes [provider]  LORazepam (ATIVAN) 1 MG tablet Take 1 tablet (1 mg total) by mouth every 8 (eight) hours as needed for anxiety or sleep. 12/08/17  Yes Gorsuch, Ni, MD  Magnesium 250 MG TABS Take 250 mg by mouth daily.   Yes [provider]  Melatonin 10 MG CAPS Take 10 mg by mouth at bedtime.   Yes [provider]  ondansetron (ZOFRAN) 8 MG tablet Take 1 tablet (8 mg total) by mouth every 8 (eight) hours as needed for nausea. 08/13/16  Yes Gorsuch, Ni, MD  Oxycodone HCl 10 MG TABS Take 5 mg by mouth every 4 (four) hours as needed for pain. 12/17/17  Yes [provider]  traZODone (DESYREL) 50 MG tablet Take 1.5 tablets (75 mg total) by mouth at bedtime. Patient taking differently: Take 50 mg by mouth at bedtime.  09/29/17  Yes Gorsuch, Ni, MD  chlorhexidine  (PERIDEX) 0.12 % solution Use as directed 15 mLs in the mouth or throat 2 (two) times daily. Patient not taking: Reported on 07/07/2017 07/29/16   Gordy Levan, MD  prochlorperazine (COMPAZINE) 10 MG tablet Take 1 tablet (10 mg total) by mouth every 6 (six) hours as needed (Nausea or vomiting). 08/28/17 12/08/17  Heath Lark, MD    Family History Family History  Problem Relation Age of Onset  . Anesthesia problems Mother   . Uterine cancer Mother 18  . Bladder Cancer Father 41  . Diabetes Sister   . Rectal cancer Cousin 31       d.69    Social History Social History   Tobacco Use  . Smoking status: Never Smoker  . Smokeless tobacco: Never Used  Substance Use Topics  . Alcohol use: Yes    Comment: socially  . Drug use: No     Allergies  Latex   Review of Systems Review of Systems  All other systems reviewed and are negative.    Physical Exam Updated Vital Signs BP (!) 147/82 (BP Location: Right Arm)   Pulse 64   Temp 97.8 F (36.6 C) (Oral)   Resp 18   SpO2 98%   Physical Exam  Constitutional: She is oriented to person, place, and time. She appears well-developed and well-nourished.  HENT:  Head: Normocephalic and atraumatic.  Eyes: Pupils are equal, round, and reactive to light. Conjunctivae and EOM are normal.  Neck: Normal range of motion and phonation normal. Neck supple.  Cardiovascular: Normal rate and regular rhythm.  Pulmonary/Chest: Effort normal and breath sounds normal. She exhibits no tenderness.  Abdominal: Soft. She exhibits no distension. There is no tenderness. There is no guarding.  Genitourinary:  Genitourinary Comments: No costovertebral angle tenderness with percussion.  Musculoskeletal: Normal range of motion.  Neurological: She is alert and oriented to person, place, and time. She exhibits normal muscle tone.  Skin: Skin is warm and dry.  Psychiatric: She has a normal mood and affect. Her behavior is normal. Judgment and thought  content normal.  Nursing note and vitals reviewed.    ED Treatments / Results  Labs (all labs ordered are listed, but only abnormal results are displayed) Labs Reviewed  COMPREHENSIVE METABOLIC PANEL - Abnormal; Notable for the following components:      Result Value   Glucose, Bld 117 (*)    BUN 22 (*)    Alkaline Phosphatase 198 (*)    All other components within normal limits  CBC WITH DIFFERENTIAL/PLATELET - Abnormal; Notable for the following components:   WBC 10.8 (*)    Hemoglobin 10.5 (*)    HCT 32.7 (*)    RDW 16.9 (*)    Neutro Abs 8.4 (*)    Monocytes Absolute 1.1 (*)    All other components within normal limits  URINALYSIS, ROUTINE W REFLEX MICROSCOPIC - Abnormal; Notable for the following components:   Hgb urine dipstick SMALL (*)    All other components within normal limits    EKG None  Radiology No results found.  Procedures Procedures (including critical care time)  Medications Ordered in ED Medications  HYDROmorphone (DILAUDID) injection 1 mg (1 mg Intravenous Given 12/21/17 2051)  ketorolac (TORADOL) 30 MG/ML injection 30 mg (30 mg Intravenous Given 12/21/17 2051)  ondansetron (ZOFRAN) injection 4 mg (4 mg Intravenous Given 12/21/17 2051)     Initial Impression / Assessment and Plan / ED Course  I have reviewed the triage vital signs and the nursing notes.  Pertinent labs & imaging results that were available during my care of the patient were reviewed by me and considered in my medical decision making (see chart for details).  Clinical Course as of Dec 26 1018  Sun Dec 21, 2017  2234 Normal except elevated white count, low hemoglobin  CBC with Differential(!) [EW]  2234 Normal except elevated glucose and BUN and alkaline phosphatase  Comprehensive metabolic panel(!) [EW]  9211 Hydronephrosis and hydroureter similar to last imaging, 12/04/2017.  Swelling secondary to pelvic mass which has enlarged somewhat.  Images reviewed by me  CT Renal Laren Everts [EW]    Clinical Course User Index [EW] Daleen Bo, MD     No data found.  At discharge- reevaluation with update and discussion. After initial assessment and treatment, an updated evaluation reveals no further complaints, findings discussed and questions answered.Daleen Bo   Medical Decision Making: Flank  pain, subacute, with known endometrial cancer, which appears to be progressing.  She has ongoing hydronephrosis and hydroureter, with possible invasion of the sigmoid colon.  She is being actively managed by oncology.  There is no indication for urgent urologic intervention at this time.  Doubt serious bacterial infection, metabolic instability or impending vascular collapse.   CRITICAL CARE-no Performed by: Daleen Bo   Nursing Notes Reviewed/ Care Coordinated Applicable Imaging Reviewed Interpretation of Laboratory Data incorporated into ED treatment  The patient appears reasonably screened and/or stabilized for discharge and I doubt any other medical condition or other [facility] requiring further screening, evaluation, or treatment in the ED at this time prior to discharge.  Plan: Home Medications-continue usual medications; Home Treatments-regular diet; return here if the recommended treatment, does not improve the symptoms; Recommended follow up-oncology follow-up as scheduled and as needed.  Consider urologic evaluation as an outpatient.     Final Clinical Impressions(s) / ED Diagnoses   Final diagnoses:  Hydronephrosis with ureteral stricture, not elsewhere classified  Endometrial cancer [facility])    ED Discharge Orders    None       Daleen Bo, MD 12/25/17 1023

## 2017-12-21 NOTE — ED Triage Notes (Signed)
Pt is c/o severe left sided low back pain. Reports that she is a ca pt and last chemo treatment was 12/11/2017. Adds that the pain was uncontrolled with home pain medicine, and a recent MRI showed inflammation to the kidney.

## 2017-12-29 ENCOUNTER — Telehealth: Payer: Self-pay | Admitting: Hematology and Oncology

## 2017-12-29 ENCOUNTER — Encounter: Payer: Self-pay | Admitting: Hematology and Oncology

## 2017-12-29 ENCOUNTER — Telehealth: Payer: Self-pay | Admitting: *Deleted

## 2017-12-29 ENCOUNTER — Inpatient Hospital Stay (HOSPITAL_BASED_OUTPATIENT_CLINIC_OR_DEPARTMENT_OTHER): Payer: Medicare Other | Admitting: Hematology and Oncology

## 2017-12-29 VITALS — BP 141/80 | HR 78 | Temp 98.1°F | Resp 17 | Ht 65.5 in | Wt 184.6 lb

## 2017-12-29 DIAGNOSIS — R64 Cachexia: Secondary | ICD-10-CM

## 2017-12-29 DIAGNOSIS — R110 Nausea: Secondary | ICD-10-CM

## 2017-12-29 DIAGNOSIS — C541 Malignant neoplasm of endometrium: Secondary | ICD-10-CM | POA: Diagnosis not present

## 2017-12-29 DIAGNOSIS — G893 Neoplasm related pain (acute) (chronic): Secondary | ICD-10-CM | POA: Diagnosis not present

## 2017-12-29 DIAGNOSIS — Z5112 Encounter for antineoplastic immunotherapy: Secondary | ICD-10-CM | POA: Diagnosis not present

## 2017-12-29 MED ORDER — MORPHINE SULFATE ER 30 MG PO TBCR: 30.0000 mg | EXTENDED_RELEASE_TABLET | Freq: Two times a day (BID) | ORAL | 0 refills | Status: DC

## 2017-12-29 NOTE — Assessment & Plan Note (Signed)
She has poorly controlled pain due to her underlying disease She just started on treatment 2 weeks ago We discussed the risk and benefit of repeat imaging study but for now, she is comfortable to wait until next week for further evaluation and second dose of treatment before repeat imaging

## 2017-12-29 NOTE — Telephone Encounter (Signed)
Pt left a message stating she is "in severe pain in back and groin". Went to ED 6/16 because of pain, taking strong pain meds every 4 hours with only minimal relief. "I cannot keep on like this"...

## 2017-12-29 NOTE — Assessment & Plan Note (Signed)
She has weight loss and malignant cachexia She will continue her prescribed Marinol for now Hopefully, with better pain control, she would be better

## 2017-12-29 NOTE — Assessment & Plan Note (Signed)
She has poorly controlled pain I review her prescription pain medicine I recommend discontinuation of hydrocodone I recommend starting her on long-acting morphine sulfate 30 mg twice a day along with Dilaudid 4 to 6 mg as needed for breakthrough pain I warned her about risk of nausea, sedation and constipation

## 2017-12-29 NOTE — Telephone Encounter (Signed)
Pt will come today at 1230 to see Dr Alvy Bimler

## 2017-12-29 NOTE — Telephone Encounter (Signed)
Per 6/24  No new orders

## 2017-12-29 NOTE — Progress Notes (Signed)
[place] OFFICE PROGRESS NOTE  Patient Care Team: [name], MD as PCP - General (Family Medicine)  ASSESSMENT & PLAN:  Recurrent carcinoma of endometrium [facility]) She has poorly controlled pain due to her underlying disease She just started on treatment 2 weeks ago We discussed the risk and benefit of repeat imaging study but for now, she is comfortable to wait until next week for further evaluation and second dose of treatment before repeat imaging  Cancer associated pain She has poorly controlled pain I review her prescription pain medicine I recommend discontinuation of hydrocodone I recommend starting her on long-acting morphine sulfate 30 mg twice a day along with Dilaudid 4 to 6 mg as needed for breakthrough pain I warned her about risk of nausea, sedation and constipation  Chronic nausea Her nausea is well controlled recently She will continue antiemetics as needed  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   INTERVAL HISTORY: Please see below for problem oriented charting. She is seen today due to poorly controlled pain She has severe left groin pain radiating to her back At present time, she rated her pain at 6 out of 10 She took Dilaudid recently The pain would wake her up from sleep It is interfering with activities of daily living She has lost some weight due to poor appetite She has chronic constipation, well controlled with laxatives She noted altered caliber of the stool but denies recent melena or hematochezia Her nausea is stable  SUMMARY OF ONCOLOGIC HISTORY: Oncology History   MSI stable on June 2017 tissue but high from [facility] study from November 2017  05/15/16: ER is moderately positive (70%). PR is strongly positive (80%).      Recurrent carcinoma of endometrium ([place])   01/02/2016 Pathology Results    Uterus +/- tubes/ovaries, neoplastic, cervix ENDOMETRIAL ADENOCARCINOMA, FIGO GRADE 2 (4.7 CM) THE TUMOR INVADES LESS THAN ONE-HALF OF THE MYOMETRIUM (PT1A) ALL MARGINS OF RESECTION ARE NEGATIVE FOR CARCINOMA LEIOMYOMAS AND ADENOMYOSIS BILATERAL FALLOPIAN TUBES AND OVARIES: HISTOLOGICAL UNREMARKABLE 2. Lymph node, sentinel, biopsy, right obturator ONE BENIGN LYMPH NODE (0/1) 3. Lymph nodes, regional resection, left pelvic FOUR BENIGN LYMPH NODES (0/4)      01/02/2016 Surgery    Dr. Denman George performed robotic-assisted laparoscopic total hysterectomy with bilateral salpingoophorectomy, sentinel lymph node biopsy, lymphadenectomy        05/15/2016 Pathology Results    Vagina, biopsy, mid - ADENOCARCINOMA, SEE COMMENT. Microscopic Comment The morphology along with the patient's history are consistent with recurrent endometrioid adenocarcinoma. The carcinoma has a similar appearance to the primary ([id number]).      05/20/2016 Imaging    Ct scan abdomen showed solid 2.5 cm peritoneal mass in the mid to anterior left pelvis, suspicious for peritoneal metastasis. 2. Small expansile low-attenuation filling defect in the left external iliac vein, cannot exclude a small deep venous thrombus. Consider correlation with left lower extremity venous Doppler scan. 3. Small simple fluid density structure in the left pelvic sidewall abutting the left external iliac vessels, favor a small postoperative seroma. 4. No ascites. 5. No lymphadenopathy.  No metastatic disease in the chest. 6. Aortic atherosclerosis.      06/12/2016 PET scan    Intensely hypermetabolic 2.1 cm central pelvic peritoneal mass just to the left of midline, consistent with peritoneal  metastatic recurrence. No ascites. 2. No additional hypermetabolic sites of metastatic disease. 3. Diffuse thyroid hypermetabolism without discrete thyroid nodule, favoring  thyroiditis. Recommend correlation with serum thyroid function tests.      06/24/2016 - 07/22/2016 Chemotherapy    The patient had weekly cisplatin. She has missed several doses due to infection and pancytopenia      06/24/2016 - 09/04/2016 Radiation Therapy    She completed concurrent radiation therapy Radiation treatment dates:   IMRT : 06/24/16 - 08/01/16 HDR : 08/13/16, 08/20/16, 08/27/16, 09/04/16  Site/dose:   Pelvis treated to 55 Gy in 25 fractions (simultaneous integrated boost technique) Vaginal Cuff treated to 24 Gy in 4 fractions      08/15/2016 - 12/03/2016 Chemotherapy    She received 6 cycles of carboplatin/Taxol      09/27/2016 Imaging    Interval decrease in size of previously described solid peritoneal nodule within the left anterior pelvis. Near complete resolution of previously described low-attenuation structure along the left pelvic sidewall. No evidence for metastatic disease in the chest. Aortic atherosclerosis.      12/30/2016 Imaging    Ct abdomen 1. Solitary left pelvic peritoneal implant is mildly decreased in size in the interval. 2. No new or progressive metastatic disease in the abdomen or pelvis. No ascites. 3. Aortic atherosclerosis.      04/02/2017 Imaging    Left lower quadrant peritoneal implant referenced on previous exam measures 1.7 x 1.3 cm, image 69 of series 2. Increased from 0.8 x 0.8 cm previously peer no new peritoneal implants identified.  Musculoskeletal: The degenerative disc disease noted within the lumbar spine.  IMPRESSION: 1. Solitary left pelvic peritoneal implant is increased in size in the interval. 2. No new sites of disease.  No ascites. 3. Aortic atherosclerosis      04/11/2017 PET scan    1. The left side of pelvis peritoneal implant has decreased in size and degree of FDG uptake compatible with response to therapy. No new areas of peritoneal disease identified. 2. Persistent diffuse increased uptake within the thyroid  gland. Correlation with patient's thyroid function may be helpful.      05/26/2017 Imaging    1. Enlarging tumor implant along the left adnexa, currently 2.6 by 2.4 cm and previously 1.9 by 1.3 cm. No new tumor implant or other specific cause for the patient's pelvic symptoms is currently identified. 2.  Aortic Atherosclerosis (ICD10-I70.0). 3. Lumbar spondylosis and degenerative disc disease causing multilevel impingement.      06/04/2017 - 08/28/2017 Chemotherapy    The patient started letrozole and everolimus      08/26/2017 Imaging    Increased size of mass in the left adnexal region.  Several new small liver metastases in right hepatic lobe      09/08/2017 Imaging    LV EF: 60% -  65%      09/10/2017 Procedure    Technically successful right IJ power-injectable port catheter placement. Ready for routine use      12/04/2017 Imaging    1. Interval increase in size and number of multiple lesions within the liver compatible with hepatic metastatic disease. 2. Interval increase in size mass within the left hemipelvis.      12/15/2017 -  Chemotherapy    The patient had pembrolizumab (KEYTRUDA) 200 mg in sodium chloride 0.9 % 50 mL chemo infusion, 200 mg, Intravenous, Once, 1 of 6 cycles Administration: 200 mg (12/15/2017)  for chemotherapy treatment.       12/21/2017 Imaging  1. Moderate left hydronephrosis and hydroureter, similar compared to most recent CT from May 2019; obstruction appears to be secondary to a left pelvic mass/metastatic focus which has increased in size since the prior CT. 2. Numerous hepatic metastatic lesions, suspect that some may be increased in size but difficult to further characterize without intravenous contrast. Increased size of left pelvic soft tissue mass/metastatic lesion. Mass abuts and possibly invades the sigmoid colon.        REVIEW OF SYSTEMS:   Constitutional: Denies fevers, chills or abnormal weight loss Eyes: Denies blurriness of  vision Ears, nose, mouth, throat, and face: Denies mucositis or sore throat Respiratory: Denies cough, dyspnea or wheezes Cardiovascular: Denies palpitation, chest discomfort or lower extremity swelling Skin: Denies abnormal skin rashes Lymphatics: Denies new lymphadenopathy or easy bruising Neurological:Denies numbness, tingling or new weaknesses Behavioral/Psych: Mood is stable, no new changes  All other systems were reviewed with the patient and are negative.  I have reviewed the past medical history, past surgical history, social history and family history with the patient and they are unchanged from previous note.  ALLERGIES:  is allergic to latex.  MEDICATIONS:  Current Outpatient Medications  Medication Sig Dispense Refill  . acetaminophen (TYLENOL) 325 MG tablet Take 650 mg by mouth every 6 (six) hours as needed for mild pain or moderate pain.     Marland Kitchen amoxicillin (AMOXIL) 500 MG capsule Take 2,000 mg by mouth See admin instructions. Prior to dental appointment, takes 4 tables prior to appt    . Biotin w/ Vitamins C & E (HAIR/SKIN/NAILS PO) Take 1 tablet by mouth daily.    . Calcium Carb-Cholecalciferol (CALCIUM 600 + D PO) Take 2 tablets by mouth daily.    . celecoxib (CELEBREX) 200 MG capsule Take 1 capsule twice a day with food as needed for chest wall pain.    Marland Kitchen dronabinol (MARINOL) 2.5 MG capsule Take 1 capsule (2.5 mg total) by mouth 2 (two) times daily before lunch and supper. 60 capsule 0  . fluticasone (FLONASE) 50 MCG/ACT nasal spray Place 1 spray into both nostrils 2 (two) times daily as needed for allergies.   1  . gabapentin (NEURONTIN) 600 MG tablet TAKE 1 TABLET BY MOUTH TWICE A DAY (Patient taking differently: Take 1 tablet 2 times a day) 60 tablet 9  . HYDROmorphone (DILAUDID) 4 MG tablet Take 1 tablet (4 mg total) by mouth every 4 (four) hours as needed for severe pain. 60 tablet 0  . ibuprofen (ADVIL,MOTRIN) 200 MG tablet Take 400 mg by mouth every 8 (eight) hours as  needed for mild pain.    Marland Kitchen levothyroxine (SYNTHROID, LEVOTHROID) 88 MCG tablet Take 88 mcg by mouth daily.  3  . loratadine (CLARITIN) 10 MG tablet Take 10 mg by mouth daily.    Marland Kitchen LORazepam (ATIVAN) 1 MG tablet Take 1 tablet (1 mg total) by mouth every 8 (eight) hours as needed for anxiety or sleep. 60 tablet 0  . Magnesium 250 MG TABS Take 250 mg by mouth daily.    . Melatonin 10 MG CAPS Take 10 mg by mouth at bedtime.    Marland Kitchen morphine (MS CONTIN) 30 MG 12 hr tablet Take 1 tablet (30 mg total) by mouth every 12 (twelve) hours. 30 tablet 0  . ondansetron (ZOFRAN) 8 MG tablet Take 1 tablet (8 mg total) by mouth every 8 (eight) hours as needed for nausea. 60 tablet 1  . Oxycodone HCl 10 MG TABS Take 5 mg by mouth every  4 (four) hours as needed for pain.  0  . traZODone (DESYREL) 50 MG tablet Take 1.5 tablets (75 mg total) by mouth at bedtime. (Patient taking differently: Take 50 mg by mouth at bedtime. ) 135 tablet 1   No current facility-administered medications for this visit.     PHYSICAL EXAMINATION: ECOG PERFORMANCE STATUS: 2 - Symptomatic, <50% confined to bed  Vitals:   12/29/17 1226  BP: (!) 141/80  Pulse: 78  Resp: 17  Temp: 98.1 F (36.7 C)  SpO2: 98%   Filed Weights   12/29/17 1226  Weight: 184 lb 9.6 oz (83.7 kg)    GENERAL:alert, no distress and comfortable SKIN: skin color, texture, turgor are normal, no rashes or significant lesions NEURO: alert & oriented x 3 with fluent speech, no focal motor/sensory deficits  LABORATORY DATA:  I have reviewed the data as listed    Component Value Date/Time   NA 136 12/21/2017 2025   NA 141 07/09/2017 1312   K 3.8 12/21/2017 2025   K 3.9 07/09/2017 1312   CL 103 12/21/2017 2025   CO2 24 12/21/2017 2025   CO2 27 07/09/2017 1312   GLUCOSE 117 (H) 12/21/2017 2025   GLUCOSE 109 07/09/2017 1312   BUN 22 (H) 12/21/2017 2025   BUN 16.4 07/09/2017 1312   CREATININE 0.82 12/21/2017 2025   CREATININE 0.85 12/15/2017 1211    CREATININE 0.8 07/09/2017 1312   CALCIUM 10.1 12/21/2017 2025   CALCIUM 9.2 07/09/2017 1312   PROT 7.8 12/21/2017 2025   PROT 7.1 07/09/2017 1312   ALBUMIN 3.6 12/21/2017 2025   ALBUMIN 3.1 (L) 07/09/2017 1312   AST 16 12/21/2017 2025   AST 18 12/15/2017 1211   AST 35 (H) 07/09/2017 1312   ALT 15 12/21/2017 2025   ALT 17 12/15/2017 1211   ALT 43 07/09/2017 1312   ALKPHOS 198 (H) 12/21/2017 2025   ALKPHOS 182 (H) 07/09/2017 1312   BILITOT 0.4 12/21/2017 2025   BILITOT 0.3 12/15/2017 1211   BILITOT 0.34 07/09/2017 1312   GFRNONAA >60 12/21/2017 2025   GFRNONAA >60 12/15/2017 1211   GFRAA >60 12/21/2017 2025   GFRAA >60 12/15/2017 1211    No results found for: SPEP, UPEP  Lab Results  Component Value Date   WBC 10.8 (H) 12/21/2017   NEUTROABS 8.4 (H) 12/21/2017   HGB 10.5 (L) 12/21/2017   HCT 32.7 (L) 12/21/2017   MCV 81.5 12/21/2017   PLT 296 12/21/2017      Chemistry      Component Value Date/Time   NA 136 12/21/2017 2025   NA 141 07/09/2017 1312   K 3.8 12/21/2017 2025   K 3.9 07/09/2017 1312   CL 103 12/21/2017 2025   CO2 24 12/21/2017 2025   CO2 27 07/09/2017 1312   BUN 22 (H) 12/21/2017 2025   BUN 16.4 07/09/2017 1312   CREATININE 0.82 12/21/2017 2025   CREATININE 0.85 12/15/2017 1211   CREATININE 0.8 07/09/2017 1312      Component Value Date/Time   CALCIUM 10.1 12/21/2017 2025   CALCIUM 9.2 07/09/2017 1312   ALKPHOS 198 (H) 12/21/2017 2025   ALKPHOS 182 (H) 07/09/2017 1312   AST 16 12/21/2017 2025   AST 18 12/15/2017 1211   AST 35 (H) 07/09/2017 1312   ALT 15 12/21/2017 2025   ALT 17 12/15/2017 1211   ALT 43 07/09/2017 1312   BILITOT 0.4 12/21/2017 2025   BILITOT 0.3 12/15/2017 1211   BILITOT 0.34 07/09/2017 1312  RADIOGRAPHIC STUDIES: I have personally reviewed the radiological images as listed and agreed with the findings in the report. Ct Abdomen Pelvis W Contrast  Result Date: 12/04/2017 CLINICAL DATA:  Patient with history  endometrial carcinoma with hepatic metastatic disease. EXAM: CT ABDOMEN AND PELVIS WITH CONTRAST TECHNIQUE: Multidetector CT imaging of the abdomen and pelvis was performed using the standard protocol following bolus administration of intravenous contrast. CONTRAST:  118m ISOVUE-300 IOPAMIDOL (ISOVUE-300) INJECTION 61% COMPARISON:  CT abdomen pelvis 08/26/2017. FINDINGS: Lower chest: Normal heart size. Dependent atelectasis within the bilateral lower lobes. No pleural effusion. Hepatobiliary: Interval increase in size of multiple liver lesions. Reference 2.0 x 1.8 cm lesion within the hepatic dome (image 10; series 2), previously 0.5 x 0.5 cm. Reference lesion within the right hepatic lobe measures 2.7 x 3.4 cm (image 30; series 2), previously 1.2 x 1.0 cm. There is a new 1.8 x 2.6 cm lesion adjacent to the intrahepatic IVC within the right hepatic lobe (image 22; series 2). Pancreas: Unremarkable Spleen: Unremarkable Adrenals/Urinary Tract: Adrenal glands are normal. Kidneys enhance symmetrically with contrast. There is moderate left hydroureteronephrosis to the level of an enhancing pelvic mass (image 67; series 2). Urinary bladder is decompressed. Stomach/Bowel: Lateral aspect of the sigmoid colon is in direct contact with enhancing enlarging left hemi pelvic mass. No evidence for bowel obstruction. Normal appendix. Small hiatal hernia. Normal morphology of the stomach. Vascular/Lymphatic: Normal caliber abdominal aorta. Peripheral calcified atherosclerotic plaque. No retroperitoneal lymphadenopathy. Reproductive: Patient status post hysterectomy. Other: Interval increase in size of enhancing mass within the left hemipelvis measuring 5.1 x 3.8 cm (image 68; series 2), previously 3.6 x 3.5 cm. Musculoskeletal: Lumbar spine degenerative changes. No aggressive or acute appearing osseous lesions. IMPRESSION: 1. Interval increase in size and number of multiple lesions within the liver compatible with hepatic  metastatic disease. 2. Interval increase in size mass within the left hemipelvis. Electronically Signed   By: DLovey NewcomerM.D.   On: 12/04/2017 09:26   Ct Renal Stone Study  Result Date: 12/21/2017 CLINICAL DATA:  Left low back pain EXAM: CT ABDOMEN AND PELVIS WITHOUT CONTRAST TECHNIQUE: Multidetector CT imaging of the abdomen and pelvis was performed following the standard protocol without IV contrast. COMPARISON:  CT 12/03/2017, 05/26/2017, 04/02/2017 FINDINGS: Lower chest: Lung bases demonstrate no acute consolidation or effusion. Borderline heart size. Hepatobiliary: Multiple hypodense liver lesions compatible with history of metastatic disease. Some of the lesions are suspected to be increased in size but further characterisation limited without contrast. No calcified gallstone or biliary dilatation Pancreas: Unremarkable. No pancreatic ductal dilatation or surrounding inflammatory changes. Spleen: Normal in size without focal abnormality. Adrenals/Urinary Tract: Adrenal glands are within normal limits. Moderate left hydronephrosis and hydroureter, similar compared to prior. No definite ureteral stones. The bladder is normal. Stomach/Bowel: The stomach is nonenlarged. No dilated small bowel. Negative appendix Vascular/Lymphatic: Nonaneurysmal aorta. Mild aortic atherosclerosis. Reproductive: Status post hysterectomy. Other: 5.3 x 4 cm left pelvic mass, increased from prior measurement of 5.1 x 3.8 cm. Left ureteral obstruction is suspected to be secondary to the pelvic mass. The mass at least abuts and questionably invades the sigmoid colon. Musculoskeletal: Degenerative changes.  No acute interval findings. IMPRESSION: 1. Moderate left hydronephrosis and hydroureter, similar compared to most recent CT from May 2019; obstruction appears to be secondary to a left pelvic mass/metastatic focus which has increased in size since the prior CT. 2. Numerous hepatic metastatic lesions, suspect that some may be  increased in size but difficult to further  characterize without intravenous contrast. Increased size of left pelvic soft tissue mass/metastatic lesion. Mass abuts and possibly invades the sigmoid colon. Electronically Signed   By: Donavan Foil M.D.   On: 12/21/2017 21:44    All questions were answered. The patient knows to call the clinic with any problems, questions or concerns. No barriers to learning was detected.  I spent 15 minutes counseling the patient face to face. The total time spent in the appointment was 20 minutes and more than 50% was on counseling and review of test results  Heath Lark, MD 12/29/2017 1:20 PM

## 2017-12-29 NOTE — Assessment & Plan Note (Signed)
Her nausea is well controlled recently She will continue antiemetics as needed

## 2018-01-05 ENCOUNTER — Inpatient Hospital Stay: Payer: Medicare Other | Attending: Hematology and Oncology

## 2018-01-05 ENCOUNTER — Inpatient Hospital Stay: Payer: Medicare Other

## 2018-01-05 ENCOUNTER — Telehealth: Payer: Self-pay | Admitting: Hematology and Oncology

## 2018-01-05 ENCOUNTER — Inpatient Hospital Stay (HOSPITAL_BASED_OUTPATIENT_CLINIC_OR_DEPARTMENT_OTHER): Payer: Medicare Other | Admitting: Hematology and Oncology

## 2018-01-05 ENCOUNTER — Encounter: Payer: Self-pay | Admitting: Hematology and Oncology

## 2018-01-05 ENCOUNTER — Other Ambulatory Visit: Payer: Self-pay

## 2018-01-05 VITALS — BP 120/75 | HR 72 | Temp 98.1°F | Resp 18 | Ht 65.5 in | Wt 186.0 lb

## 2018-01-05 DIAGNOSIS — C541 Malignant neoplasm of endometrium: Secondary | ICD-10-CM

## 2018-01-05 DIAGNOSIS — E038 Other specified hypothyroidism: Secondary | ICD-10-CM | POA: Diagnosis not present

## 2018-01-05 DIAGNOSIS — Z79899 Other long term (current) drug therapy: Secondary | ICD-10-CM | POA: Insufficient documentation

## 2018-01-05 DIAGNOSIS — D539 Nutritional anemia, unspecified: Secondary | ICD-10-CM

## 2018-01-05 DIAGNOSIS — E039 Hypothyroidism, unspecified: Secondary | ICD-10-CM

## 2018-01-05 DIAGNOSIS — D638 Anemia in other chronic diseases classified elsewhere: Secondary | ICD-10-CM | POA: Insufficient documentation

## 2018-01-05 DIAGNOSIS — D509 Iron deficiency anemia, unspecified: Secondary | ICD-10-CM | POA: Diagnosis not present

## 2018-01-05 DIAGNOSIS — K5909 Other constipation: Secondary | ICD-10-CM | POA: Diagnosis not present

## 2018-01-05 DIAGNOSIS — Z5112 Encounter for antineoplastic immunotherapy: Secondary | ICD-10-CM | POA: Diagnosis present

## 2018-01-05 DIAGNOSIS — G893 Neoplasm related pain (acute) (chronic): Secondary | ICD-10-CM

## 2018-01-05 DIAGNOSIS — R64 Cachexia: Secondary | ICD-10-CM

## 2018-01-05 DIAGNOSIS — G2581 Restless legs syndrome: Secondary | ICD-10-CM | POA: Diagnosis not present

## 2018-01-05 DIAGNOSIS — C787 Secondary malignant neoplasm of liver and intrahepatic bile duct: Secondary | ICD-10-CM | POA: Diagnosis not present

## 2018-01-05 LAB — CMP (CANCER CENTER ONLY)
ALK PHOS: 178 U/L — AB (ref 38–126)
ALT: 13 U/L (ref 0–44)
AST: 16 U/L (ref 15–41)
Albumin: 3.2 g/dL — ABNORMAL LOW (ref 3.5–5.0)
Anion gap: 7 (ref 5–15)
BUN: 16 mg/dL (ref 8–23)
CALCIUM: 10.2 mg/dL (ref 8.9–10.3)
CO2: 29 mmol/L (ref 22–32)
CREATININE: 0.72 mg/dL (ref 0.44–1.00)
Chloride: 101 mmol/L (ref 98–111)
GFR, Estimated: >60 mL/min (ref >60)
Glucose, Bld: 90 mg/dL (ref 70–99)
Potassium: 3.9 mmol/L (ref 3.5–5.1)
SODIUM: 137 mmol/L (ref 135–145)
Total Bilirubin: 0.5 mg/dL (ref 0.3–1.2)
Total Protein: 6.9 g/dL (ref 6.5–8.1)

## 2018-01-05 LAB — IRON AND TIBC
Iron: 32 ug/dL — ABNORMAL LOW (ref 41–142)
Saturation Ratios: 13 % — ABNORMAL LOW (ref 21–57)
TIBC: 250 ug/dL (ref 236–444)
UIBC: 219 ug/dL

## 2018-01-05 LAB — CBC WITH DIFFERENTIAL (CANCER CENTER ONLY)
BASOS ABS: 0 10*3/uL (ref 0.0–0.1)
BASOS PCT: 1 %
EOS ABS: 0.1 10*3/uL (ref 0.0–0.5)
EOS PCT: 3 %
HEMATOCRIT: 28 % — AB (ref 34.8–46.6)
Hemoglobin: 9.3 g/dL — ABNORMAL LOW (ref 11.6–15.9)
Lymphocytes Relative: 15 %
Lymphs Abs: 0.8 10*3/uL — ABNORMAL LOW (ref 0.9–3.3)
MCH: 26.7 pg (ref 25.1–34.0)
MCHC: 33.4 g/dL (ref 31.5–36.0)
MCV: 79.9 fL (ref 79.5–101.0)
MONO ABS: 0.4 10*3/uL (ref 0.1–0.9)
Monocytes Relative: 8 %
Neutro Abs: 4 10*3/uL (ref 1.5–6.5)
Neutrophils Relative %: 73 %
Platelet Count: 222 10*3/uL (ref 145–400)
RBC: 3.5 MIL/uL — ABNORMAL LOW (ref 3.70–5.45)
RDW: 18.1 % — AB (ref 11.2–14.5)
WBC Count: 5.5 10*3/uL (ref 3.9–10.3)

## 2018-01-05 LAB — TSH: TSH: 0.112 u[IU]/mL — ABNORMAL LOW (ref 0.308–3.960)

## 2018-01-05 LAB — FERRITIN: FERRITIN: 754 ng/mL — AB (ref 11–307)

## 2018-01-05 MED ORDER — PEMBROLIZUMAB CHEMO INJECTION 100 MG/4ML
200.0000 mg | Freq: Once | INTRAVENOUS | Status: AC
  Administered 2018-01-05: 200 mg via INTRAVENOUS
  Filled 2018-01-05: qty 8

## 2018-01-05 MED ORDER — HYDROMORPHONE HCL 4 MG PO TABS: 4.0000 mg | ORAL_TABLET | ORAL | 0 refills | Status: DC | PRN

## 2018-01-05 MED ORDER — SODIUM CHLORIDE 0.9% FLUSH
10.0000 mL | INTRAVENOUS | Status: DC | PRN
  Administered 2018-01-05: 10 mL
  Filled 2018-01-05: qty 10

## 2018-01-05 MED ORDER — MORPHINE SULFATE ER 30 MG PO TBCR: 30.0000 mg | EXTENDED_RELEASE_TABLET | Freq: Two times a day (BID) | ORAL | 0 refills | Status: DC

## 2018-01-05 MED ORDER — SODIUM CHLORIDE 0.9 % IV SOLN
Freq: Once | INTRAVENOUS | Status: AC
  Administered 2018-01-05: 13:00:00 via INTRAVENOUS

## 2018-01-05 MED ORDER — DRONABINOL 2.5 MG PO CAPS: 2.5000 mg | ORAL_CAPSULE | Freq: Two times a day (BID) | ORAL | 0 refills | Status: DC

## 2018-01-05 MED ORDER — HEPARIN SOD (PORK) LOCK FLUSH 100 UNIT/ML IV SOLN
500.0000 [IU] | Freq: Once | INTRAVENOUS | Status: AC | PRN
  Administered 2018-01-05: 500 [IU]
  Filled 2018-01-05: qty 5

## 2018-01-05 MED ORDER — SODIUM CHLORIDE 0.9% FLUSH
10.0000 mL | Freq: Once | INTRAVENOUS | Status: AC
  Administered 2018-01-05: 10 mL
  Filled 2018-01-05: qty 10

## 2018-01-05 NOTE — Patient Instructions (Signed)
[facility] Discharge Instructions for Patients Receiving Chemotherapy  Today you received the following chemotherapy agents Pembrolizumab (Keytruda).  To help prevent nausea and vomiting after your treatment, we encourage you to take your nausea medication as prescribed.   If you develop nausea and vomiting that is not controlled by your nausea medication, call the clinic.   BELOW ARE SYMPTOMS THAT SHOULD BE REPORTED IMMEDIATELY:  *FEVER GREATER THAN 100.5 F  *CHILLS WITH OR WITHOUT FEVER  NAUSEA AND VOMITING THAT IS NOT CONTROLLED WITH YOUR NAUSEA MEDICATION  *UNUSUAL SHORTNESS OF BREATH  *UNUSUAL BRUISING OR BLEEDING  TENDERNESS IN MOUTH AND THROAT WITH OR WITHOUT PRESENCE OF ULCERS  *URINARY PROBLEMS  *BOWEL PROBLEMS  UNUSUAL RASH Items with * indicate a potential emergency and should be followed up as soon as possible.  Feel free to call the clinic should you have any questions or concerns. The clinic phone number is [phone].  Please show the CHEMO ALERT CARD at check-in to the Emergency Department and triage nurse.   

## 2018-01-05 NOTE — Assessment & Plan Note (Signed)
Her pain is well controlled since her last visit We will proceed with cycle #2 as scheduled I plan to recommend minimum 3 months of treatment before repeat imaging study

## 2018-01-05 NOTE — Assessment & Plan Note (Addendum)
Her pain is well controlled with MS Contin I refilled her prescription today She is warned about risk of sedation, nausea and constipation She can use breakthrough pain medicine with Dilaudid as needed I will get nursing staff to assess for pain control weekly at home through advanced home care as well

## 2018-01-05 NOTE — Telephone Encounter (Signed)
Gave patient avs report and appointments for July/August in infusion room. Central radiology will call re scan.

## 2018-01-05 NOTE — Assessment & Plan Note (Signed)
She had recent weight loss but since she has better pain control, she is starting to eat better I refilled her prescription Marinol

## 2018-01-05 NOTE — Assessment & Plan Note (Signed)
She has chronic history of hypothyroidism Her TSH is a bit suppressed but due to recent changes in weight and appetite, I recommend close observation only for now and plan to recheck TSH again in the next visit and adjust her medication as needed

## 2018-01-05 NOTE — Assessment & Plan Note (Signed)
She has sign of iron deficiency anemia Iron saturation is low despite elevated ferritin level (which is consistent with inflammatory condition)  I recommend intravenous iron infusion to treat her iron deficiency anemia due to poor tolerance to oral iron in the past Due to her immunocompromise state, I will try to see if we can do intravenous iron infusion at home and she agree with the plan of care

## 2018-01-05 NOTE — Patient Instructions (Signed)
[facility] Discharge Instructions for Patients Receiving Chemotherapy  Today you received the following chemotherapy agents Pembrolizumab (Keytruda).  To help prevent nausea and vomiting after your treatment, we encourage you to take your nausea medication as prescribed.   If you develop nausea and vomiting that is not controlled by your nausea medication, call the clinic.   BELOW ARE SYMPTOMS THAT SHOULD BE REPORTED IMMEDIATELY:  *FEVER GREATER THAN 100.5 F  *CHILLS WITH OR WITHOUT FEVER  NAUSEA AND VOMITING THAT IS NOT CONTROLLED WITH YOUR NAUSEA MEDICATION  *UNUSUAL SHORTNESS OF BREATH  *UNUSUAL BRUISING OR BLEEDING  TENDERNESS IN MOUTH AND THROAT WITH OR WITHOUT PRESENCE OF ULCERS  *URINARY PROBLEMS  *BOWEL PROBLEMS  UNUSUAL RASH Items with * indicate a potential emergency and should be followed up as soon as possible.  Feel free to call the clinic should you have any questions or concerns. The clinic phone number is [phone].  Please show the CHEMO ALERT CARD at check-in to the Emergency Department and triage nurse.   

## 2018-01-05 NOTE — Progress Notes (Signed)
[place] OFFICE PROGRESS NOTE  Patient Care Team: [name], MD as PCP - General (Family Medicine)  ASSESSMENT & PLAN:  Recurrent carcinoma of endometrium [facility]) Her pain is well controlled since her last visit We will proceed with cycle #2 as scheduled I plan to recommend minimum 3 months of treatment before repeat imaging study  Cancer associated pain Her pain is well controlled with MS Contin I refilled her prescription today She is warned about risk of sedation, nausea and constipation She can use breakthrough pain medicine with Dilaudid as needed I will get nursing staff to assess for pain control weekly at home through advanced home care as well  Iron deficiency anemia She has sign of iron deficiency anemia Iron saturation is low despite elevated ferritin level (which is consistent with inflammatory condition)  I recommend intravenous iron infusion to treat her iron deficiency anemia due to poor tolerance to oral iron in the past Due to her immunocompromise state, I will try to see if we can do intravenous iron infusion at home and she agree with the plan of care  Malignant cachexia ([place]) She had recent weight loss but since she has better pain control, she is starting to eat better I refilled her prescription Marinol  Acquired hypothyroidism She has chronic history of hypothyroidism Her TSH is a bit suppressed but due to recent changes in weight and appetite, I recommend close observation only for now and plan to recheck TSH again in the next visit and adjust her medication as needed   Orders Placed This Encounter  Procedures  . CT ABDOMEN PELVIS W CONTRAST    Standing Status:   Future    Standing Expiration Date:   01/06/2019    Order Specific Question:   If indicated for the ordered procedure, I authorize the administration of contrast media per Radiology protocol    Answer:   Yes    Order Specific Question:   Preferred imaging location?     Answer:   [facility]    Order Specific Question:   Radiology Contrast Protocol - do NOT remove file path    Answer:   [email or web address]  . Ferritin    Standing Status:   Future    Number of Occurrences:   1    Standing Expiration Date:   01/05/2019  . Iron and TIBC    Standing Status:   Future    Number of Occurrences:   1    Standing Expiration Date:   02/09/2019  . Ambulatory referral to Home Health    Referral Priority:   Routine    Referral Type:   Home Health Care    Referral Reason:   Specialty Services Required    Requested Specialty:   [place]    Number of Visits Requested:   1    INTERVAL HISTORY: Please see below for problem oriented charting. She returns for further follow-up and treatment Since last time I saw her, her pain control has improved dramatically She rarely takes breakthrough pain medicine since I started her on MS Contin She denies recent nausea or vomiting She has started to eat better although she has lost some weight since the last time I saw her She denies recent vaginal bleeding The patient denies any recent signs or symptoms of bleeding such as spontaneous epistaxis, hematuria or hematochezia.  SUMMARY OF ONCOLOGIC HISTORY: Oncology History   MSI stable on June 2017 tissue but high from [place] [facility] study  from November 2017  05/15/16: ER is moderately positive (70%). PR is strongly positive (80%).     Recurrent carcinoma of endometrium ([place])   01/02/2016 Pathology Results    Uterus +/- tubes/ovaries, neoplastic, cervix ENDOMETRIAL ADENOCARCINOMA, FIGO GRADE 2 (4.7 CM) THE TUMOR INVADES LESS THAN ONE-HALF OF THE MYOMETRIUM (PT1A) ALL MARGINS OF RESECTION ARE NEGATIVE FOR CARCINOMA LEIOMYOMAS AND ADENOMYOSIS BILATERAL FALLOPIAN TUBES AND OVARIES: HISTOLOGICAL UNREMARKABLE 2. Lymph node, sentinel, biopsy, right obturator ONE BENIGN LYMPH NODE (0/1) 3. Lymph nodes, regional resection, left  pelvic FOUR BENIGN LYMPH NODES (0/4)      01/02/2016 Surgery    Dr. Denman George performed robotic-assisted laparoscopic total hysterectomy with bilateral salpingoophorectomy, sentinel lymph node biopsy, lymphadenectomy        05/15/2016 Pathology Results    Vagina, biopsy, mid - ADENOCARCINOMA, SEE COMMENT. Microscopic Comment The morphology along with the patient's history are consistent with recurrent endometrioid adenocarcinoma. The carcinoma has a similar appearance to the primary ([id number]).      05/20/2016 Imaging    Ct scan abdomen showed solid 2.5 cm peritoneal mass in the mid to anterior left pelvis, suspicious for peritoneal metastasis. 2. Small expansile low-attenuation filling defect in the left external iliac vein, cannot exclude a small deep venous thrombus. Consider correlation with left lower extremity venous Doppler scan. 3. Small simple fluid density structure in the left pelvic sidewall abutting the left external iliac vessels, favor a small postoperative seroma. 4. No ascites. 5. No lymphadenopathy.  No metastatic disease in the chest. 6. Aortic atherosclerosis.      06/12/2016 PET scan    Intensely hypermetabolic 2.1 cm central pelvic peritoneal mass just to the left of midline, consistent with peritoneal metastatic recurrence. No ascites. 2. No additional hypermetabolic sites of metastatic disease. 3. Diffuse thyroid hypermetabolism without discrete thyroid nodule, favoring thyroiditis. Recommend correlation with serum thyroid function tests.      06/24/2016 - 07/22/2016 Chemotherapy    The patient had weekly cisplatin. She has missed several doses due to infection and pancytopenia      06/24/2016 - 09/04/2016 Radiation Therapy    She completed concurrent radiation therapy Radiation treatment dates:   IMRT : 06/24/16 - 08/01/16 HDR : 08/13/16, 08/20/16, 08/27/16, 09/04/16  Site/dose:   Pelvis treated to 55 Gy in 25 fractions (simultaneous integrated boost  technique) Vaginal Cuff treated to 24 Gy in 4 fractions      08/15/2016 - 12/03/2016 Chemotherapy    She received 6 cycles of carboplatin/Taxol      09/27/2016 Imaging    Interval decrease in size of previously described solid peritoneal nodule within the left anterior pelvis. Near complete resolution of previously described low-attenuation structure along the left pelvic sidewall. No evidence for metastatic disease in the chest. Aortic atherosclerosis.      12/30/2016 Imaging    Ct abdomen 1. Solitary left pelvic peritoneal implant is mildly decreased in size in the interval. 2. No new or progressive metastatic disease in the abdomen or pelvis. No ascites. 3. Aortic atherosclerosis.      04/02/2017 Imaging    Left lower quadrant peritoneal implant referenced on previous exam measures 1.7 x 1.3 cm, image 69 of series 2. Increased from 0.8 x 0.8 cm previously peer no new peritoneal implants identified.  Musculoskeletal: The degenerative disc disease noted within the lumbar spine.  IMPRESSION: 1. Solitary left pelvic peritoneal implant is increased in size in the interval. 2. No new sites of disease.  No ascites. 3. Aortic atherosclerosis  04/11/2017 PET scan    1. The left side of pelvis peritoneal implant has decreased in size and degree of FDG uptake compatible with response to therapy. No new areas of peritoneal disease identified. 2. Persistent diffuse increased uptake within the thyroid gland. Correlation with patient's thyroid function may be helpful.      05/26/2017 Imaging    1. Enlarging tumor implant along the left adnexa, currently 2.6 by 2.4 cm and previously 1.9 by 1.3 cm. No new tumor implant or other specific cause for the patient's pelvic symptoms is currently identified. 2.  Aortic Atherosclerosis (ICD10-I70.0). 3. Lumbar spondylosis and degenerative disc disease causing multilevel impingement.      06/04/2017 - 08/28/2017 Chemotherapy    The patient  started letrozole and everolimus      08/26/2017 Imaging    Increased size of mass in the left adnexal region.  Several new small liver metastases in right hepatic lobe      09/08/2017 Imaging    LV EF: 60% -  65%      09/10/2017 Procedure    Technically successful right IJ power-injectable port catheter placement. Ready for routine use      12/04/2017 Imaging    1. Interval increase in size and number of multiple lesions within the liver compatible with hepatic metastatic disease. 2. Interval increase in size mass within the left hemipelvis.      12/15/2017 -  Chemotherapy    The patient had pembrolizumab (KEYTRUDA) 200 mg in sodium chloride 0.9 % 50 mL chemo infusion, 200 mg, Intravenous, Once, 1 of 6 cycles Administration: 200 mg (12/15/2017)  for chemotherapy treatment.       12/21/2017 Imaging    1. Moderate left hydronephrosis and hydroureter, similar compared to most recent CT from May 2019; obstruction appears to be secondary to a left pelvic mass/metastatic focus which has increased in size since the prior CT. 2. Numerous hepatic metastatic lesions, suspect that some may be increased in size but difficult to further characterize without intravenous contrast. Increased size of left pelvic soft tissue mass/metastatic lesion. Mass abuts and possibly invades the sigmoid colon.        REVIEW OF SYSTEMS:   Constitutional: Denies fevers, chills or abnormal weight loss Eyes: Denies blurriness of vision Ears, nose, mouth, throat, and face: Denies mucositis or sore throat Respiratory: Denies cough, dyspnea or wheezes Cardiovascular: Denies palpitation, chest discomfort or lower extremity swelling Gastrointestinal:  Denies nausea, heartburn or change in bowel habits Skin: Denies abnormal skin rashes Lymphatics: Denies new lymphadenopathy or easy bruising Neurological:Denies numbness, tingling or new weaknesses Behavioral/Psych: Mood is stable, no new changes  All other systems  were reviewed with the patient and are negative.  I have reviewed the past medical history, past surgical history, social history and family history with the patient and they are unchanged from previous note.  ALLERGIES:  is allergic to latex.  MEDICATIONS:  Current Outpatient Medications  Medication Sig Dispense Refill  . acetaminophen (TYLENOL) 325 MG tablet Take 650 mg by mouth every 6 (six) hours as needed for mild pain or moderate pain.     Marland Kitchen amoxicillin (AMOXIL) 500 MG capsule Take 2,000 mg by mouth See admin instructions. Prior to dental appointment, takes 4 tables prior to appt    . Biotin w/ Vitamins C & E (HAIR/SKIN/NAILS PO) Take 1 tablet by mouth daily.    . Calcium Carb-Cholecalciferol (CALCIUM 600 + D PO) Take 2 tablets by mouth daily.    . celecoxib (CELEBREX)  200 MG capsule Take 1 capsule twice a day with food as needed for chest wall pain.    Marland Kitchen dronabinol (MARINOL) 2.5 MG capsule Take 1 capsule (2.5 mg total) by mouth 2 (two) times daily before lunch and supper. 60 capsule 0  . fluticasone (FLONASE) 50 MCG/ACT nasal spray Place 1 spray into both nostrils 2 (two) times daily as needed for allergies.   1  . gabapentin (NEURONTIN) 600 MG tablet TAKE 1 TABLET BY MOUTH TWICE A DAY (Patient taking differently: Take 1 tablet 2 times a day) 60 tablet 9  . HYDROmorphone (DILAUDID) 4 MG tablet Take 1 tablet (4 mg total) by mouth every 4 (four) hours as needed for severe pain. 60 tablet 0  . ibuprofen (ADVIL,MOTRIN) 200 MG tablet Take 400 mg by mouth every 8 (eight) hours as needed for mild pain.    Marland Kitchen levothyroxine (SYNTHROID, LEVOTHROID) 88 MCG tablet Take 88 mcg by mouth daily.  3  . loratadine (CLARITIN) 10 MG tablet Take 10 mg by mouth daily.    Marland Kitchen LORazepam (ATIVAN) 1 MG tablet Take 1 tablet (1 mg total) by mouth every 8 (eight) hours as needed for anxiety or sleep. 60 tablet 0  . Magnesium 250 MG TABS Take 250 mg by mouth daily.    . Melatonin 10 MG CAPS Take 10 mg by mouth at  bedtime.    Marland Kitchen morphine (MS CONTIN) 30 MG 12 hr tablet Take 1 tablet (30 mg total) by mouth every 12 (twelve) hours. 60 tablet 0  . ondansetron (ZOFRAN) 8 MG tablet Take 1 tablet (8 mg total) by mouth every 8 (eight) hours as needed for nausea. 60 tablet 1  . traZODone (DESYREL) 50 MG tablet Take 1.5 tablets (75 mg total) by mouth at bedtime. (Patient taking differently: Take 50 mg by mouth at bedtime. ) 135 tablet 1   No current facility-administered medications for this visit.    Facility-Administered Medications Ordered in Other Visits  Medication Dose Route Frequency Provider Last Rate Last Dose  . sodium chloride flush (NS) 0.9 % injection 10 mL  10 mL Intracatheter PRN Alvy Bimler, Vrishank Moster, MD   10 mL at 01/05/18 1448    PHYSICAL EXAMINATION: ECOG PERFORMANCE STATUS: 1 - Symptomatic but completely ambulatory  Vitals:   01/05/18 1249  BP: 120/75  Pulse: 72  Resp: 18  Temp: 98.1 F (36.7 C)  SpO2: 99%   Filed Weights   01/05/18 1249  Weight: 186 lb (84.4 kg)    GENERAL:alert, no distress and comfortable SKIN: skin color, texture, turgor are normal, no rashes or significant lesions EYES: normal, Conjunctiva are pink and non-injected, sclera clear OROPHARYNX:no exudate, no erythema and lips, buccal mucosa, and tongue normal  NECK: supple, thyroid normal size, non-tender, without nodularity LYMPH:  no palpable lymphadenopathy in the cervical, axillary or inguinal LUNGS: clear to auscultation and percussion with normal breathing effort HEART: regular rate & rhythm and no murmurs and no lower extremity edema ABDOMEN:abdomen soft, mild tenderness on palpation on the left lower quadrant without rebound or guarding Musculoskeletal:no cyanosis of digits and no clubbing  NEURO: alert & oriented x 3 with fluent speech, no focal motor/sensory deficits  LABORATORY DATA:  I have reviewed the data as listed    Component Value Date/Time   NA 137 01/05/2018 1208   NA 141 07/09/2017 1312   K  3.9 01/05/2018 1208   K 3.9 07/09/2017 1312   CL 101 01/05/2018 1208   CO2 29 01/05/2018 1208  CO2 27 07/09/2017 1312   GLUCOSE 90 01/05/2018 1208   GLUCOSE 109 07/09/2017 1312   BUN 16 01/05/2018 1208   BUN 16.4 07/09/2017 1312   CREATININE 0.72 01/05/2018 1208   CREATININE 0.8 07/09/2017 1312   CALCIUM 10.2 01/05/2018 1208   CALCIUM 9.2 07/09/2017 1312   PROT 6.9 01/05/2018 1208   PROT 7.1 07/09/2017 1312   ALBUMIN 3.2 (L) 01/05/2018 1208   ALBUMIN 3.1 (L) 07/09/2017 1312   AST 16 01/05/2018 1208   AST 35 (H) 07/09/2017 1312   ALT 13 01/05/2018 1208   ALT 43 07/09/2017 1312   ALKPHOS 178 (H) 01/05/2018 1208   ALKPHOS 182 (H) 07/09/2017 1312   BILITOT 0.5 01/05/2018 1208   BILITOT 0.34 07/09/2017 1312   GFRNONAA >60 01/05/2018 1208   GFRAA >60 01/05/2018 1208    No results found for: SPEP, UPEP  Lab Results  Component Value Date   WBC 5.5 01/05/2018   NEUTROABS 4.0 01/05/2018   HGB 9.3 (L) 01/05/2018   HCT 28.0 (L) 01/05/2018   MCV 79.9 01/05/2018   PLT 222 01/05/2018      Chemistry      Component Value Date/Time   NA 137 01/05/2018 1208   NA 141 07/09/2017 1312   K 3.9 01/05/2018 1208   K 3.9 07/09/2017 1312   CL 101 01/05/2018 1208   CO2 29 01/05/2018 1208   CO2 27 07/09/2017 1312   BUN 16 01/05/2018 1208   BUN 16.4 07/09/2017 1312   CREATININE 0.72 01/05/2018 1208   CREATININE 0.8 07/09/2017 1312      Component Value Date/Time   CALCIUM 10.2 01/05/2018 1208   CALCIUM 9.2 07/09/2017 1312   ALKPHOS 178 (H) 01/05/2018 1208   ALKPHOS 182 (H) 07/09/2017 1312   AST 16 01/05/2018 1208   AST 35 (H) 07/09/2017 1312   ALT 13 01/05/2018 1208   ALT 43 07/09/2017 1312   BILITOT 0.5 01/05/2018 1208   BILITOT 0.34 07/09/2017 1312       RADIOGRAPHIC STUDIES: I have personally reviewed the radiological images as listed and agreed with the findings in the report. Ct Renal Stone Study  Result Date: 12/21/2017 CLINICAL DATA:  Left low back pain EXAM: CT  ABDOMEN AND PELVIS WITHOUT CONTRAST TECHNIQUE: Multidetector CT imaging of the abdomen and pelvis was performed following the standard protocol without IV contrast. COMPARISON:  CT 12/03/2017, 05/26/2017, 04/02/2017 FINDINGS: Lower chest: Lung bases demonstrate no acute consolidation or effusion. Borderline heart size. Hepatobiliary: Multiple hypodense liver lesions compatible with history of metastatic disease. Some of the lesions are suspected to be increased in size but further characterisation limited without contrast. No calcified gallstone or biliary dilatation Pancreas: Unremarkable. No pancreatic ductal dilatation or surrounding inflammatory changes. Spleen: Normal in size without focal abnormality. Adrenals/Urinary Tract: Adrenal glands are within normal limits. Moderate left hydronephrosis and hydroureter, similar compared to prior. No definite ureteral stones. The bladder is normal. Stomach/Bowel: The stomach is nonenlarged. No dilated small bowel. Negative appendix Vascular/Lymphatic: Nonaneurysmal aorta. Mild aortic atherosclerosis. Reproductive: Status post hysterectomy. Other: 5.3 x 4 cm left pelvic mass, increased from prior measurement of 5.1 x 3.8 cm. Left ureteral obstruction is suspected to be secondary to the pelvic mass. The mass at least abuts and questionably invades the sigmoid colon. Musculoskeletal: Degenerative changes.  No acute interval findings. IMPRESSION: 1. Moderate left hydronephrosis and hydroureter, similar compared to most recent CT from May 2019; obstruction appears to be secondary to a left pelvic mass/metastatic focus which has  increased in size since the prior CT. 2. Numerous hepatic metastatic lesions, suspect that some may be increased in size but difficult to further characterize without intravenous contrast. Increased size of left pelvic soft tissue mass/metastatic lesion. Mass abuts and possibly invades the sigmoid colon. Electronically Signed   By: Donavan Foil M.D.    On: 12/21/2017 21:44    All questions were answered. The patient knows to call the clinic with any problems, questions or concerns. No barriers to learning was detected.  I spent 25 minutes counseling the patient face to face. The total time spent in the appointment was 30 minutes and more than 50% was on counseling and review of test results  Heath Lark, MD 01/05/2018 3:02 PM

## 2018-01-06 ENCOUNTER — Telehealth: Payer: Self-pay

## 2018-01-06 NOTE — Telephone Encounter (Signed)
Nurse with [facility] called and left a message She is admitting patient in the home for IV iron. Patient is more comfortable getting IV iron at [facility]. Dr. Alvy Bimler notified of message from [facility].  Notified [facility] to cancel IV iron.  Called [place] and told her a scheduling message has been sent for IV iron for next week. She verbalized understanding.

## 2018-01-12 ENCOUNTER — Other Ambulatory Visit: Payer: Self-pay | Admitting: Hematology and Oncology

## 2018-01-12 ENCOUNTER — Inpatient Hospital Stay (HOSPITAL_BASED_OUTPATIENT_CLINIC_OR_DEPARTMENT_OTHER): Payer: Medicare Other | Admitting: Medical

## 2018-01-12 ENCOUNTER — Inpatient Hospital Stay: Payer: Medicare Other | Admitting: *Deleted

## 2018-01-12 VITALS — BP 127/64 | HR 68 | Temp 98.0°F | Resp 18

## 2018-01-12 DIAGNOSIS — Z5112 Encounter for antineoplastic immunotherapy: Secondary | ICD-10-CM | POA: Diagnosis not present

## 2018-01-12 DIAGNOSIS — C541 Malignant neoplasm of endometrium: Secondary | ICD-10-CM

## 2018-01-12 DIAGNOSIS — K5909 Other constipation: Secondary | ICD-10-CM

## 2018-01-12 MED ORDER — SODIUM CHLORIDE 0.9 % IV SOLN
510.0000 mg | Freq: Once | INTRAVENOUS | Status: AC
  Administered 2018-01-12: 510 mg via INTRAVENOUS
  Filled 2018-01-12: qty 17

## 2018-01-12 MED ORDER — SODIUM CHLORIDE 0.9% FLUSH
10.0000 mL | Freq: Once | INTRAVENOUS | Status: AC
  Administered 2018-01-12: 10 mL
  Filled 2018-01-12: qty 10

## 2018-01-12 MED ORDER — HEPARIN SOD (PORK) LOCK FLUSH 100 UNIT/ML IV SOLN
500.0000 [IU] | Freq: Once | INTRAVENOUS | Status: AC
  Administered 2018-01-12: 500 [IU]
  Filled 2018-01-12: qty 5

## 2018-01-12 NOTE — Patient Instructions (Signed)

## 2018-01-12 NOTE — Patient Instructions (Signed)
Constipation Management  Magnesium Citrate, drink 1/2 bottle, drink remainder if no bowel movement with 30 to 60 minutes  Or  30 mg (1 tablespoon) of Milk of Magnesia in 8 ounces of prune juice, warm in microwave for 20 seconds  Dulcolax suppository today    Begin the following after you have had a bowel movement:  Senna-S, 1 to 2 tablets twice daily  MiraLAX 17 grams in 8 ounces of liquids 1 to 2 times daily as needed   Remember to remain well hydrated. Drink, Drink, Drink non-caffeinated beverages.   Adjust these medications based on your response. If your bowel movements become too loose then decrease the amount of Senna-S and/or MiraLAX that you are using. If your bowel movements become too firm or are difficult to pass, the increase the amount of Senna-S and/or MiraLAX that you are using and increase your intake of water.

## 2018-01-12 NOTE — Progress Notes (Signed)
Patient presented to treatment today c/o persistent constipation. States she has taken OTC "[employer] stool softeners," Senna, and Miralax with no results. Denies abdominal pain, nausea, and/or vomiting. Sandi Mealy, PA-C notified and will see patient in treatment room.

## 2018-01-12 NOTE — Progress Notes (Signed)
[facility] Progress Note   Michelle Rosario [id number] 30-Apr-1946 72 y.o.  Michelle Rosario is managed by Dr. Heath Lark  Actively treated with chemotherapy/immunotherapy: yes  Current Therapy: Keytruda  Last Treated: 01/12/2018 (cycle 2, day 1)  Assessment: Plan:    Other constipation   Constipation: The following recommendations were made:  Magnesium Citrate, drink 1/2 bottle, drink remainder if no bowel movement with 30 to 60 minutes  Or  30 mg (1 tablespoon) of Milk of Magnesia in 8 ounces of prune juice, warm in microwave for 20 seconds  Dulcolax suppository today    Begin the following after you have had a bowel movement:  Senna-S, 1 to 2 tablets twice daily  MiraLAX 17 grams in 8 ounces of liquids 1 to 2 times daily as needed   Remember to remain well hydrated. Drink, Drink, Drink non-caffeinated beverages.   Adjust these medications based on your response. If your bowel movements become too loose then decrease the amount of Senna-S and/or MiraLAX that you are using. If your bowel movements become too firm or are difficult to pass, the increase the amount of Senna-S and/or MiraLAX that you are using and increase your intake of water.  Please see After Visit Summary for patient specific instructions.  Future Appointments  Date Time Provider [place]  01/26/2018 11:30 AM [facility] ONLY [facility] None  01/26/2018 11:45 AM [facility] [place] FLUSH [facility] None  01/26/2018 12:30 PM Alvy Bimler, [place], MD [facility] None  01/26/2018  1:45 PM [facility] INFUSION [facility] None  02/20/2018 11:30 AM [facility] ONLY [facility] None  02/20/2018 11:45 AM [facility] [place] FLUSH [facility] None  02/23/2018 11:00 AM Heath Lark, MD [facility] None  02/23/2018 12:15 PM [facility] INFUSION [facility] None    No orders of the defined types were placed in this encounter.      Subjective:   Patient ID:  Michelle Rosario is a 72 y.o. (DOB  February 29, 1946) female.  Chief Complaint: No chief complaint on file.   HPI Michelle Rosario is a 72 year old female with a recurrent carcinoma of the endometrium.  She is managed by Dr. Heath Lark and is receiving cycle 2, day 1 of Keytruda today.  She reports that she has had constipation for 3 days.  She is taken 3 [employer] stool softeners each evening, 2 senna daily, MiraLAX, cherries, and coffee.  Despite this she has not had a bowel movement.  She denies nausea, vomiting, or abdominal pain.  Medications: I have reviewed the patient's current medications.  Allergies:  Allergies  Allergen Reactions  . Latex Itching    Past Medical History:  Diagnosis Date  . Broken ribs    hx of  . Family history of adverse reaction to anesthesia    mother post op N&V  . History of radiation therapy 06/24/16-08/01/16 and 08/13/16, 08/20/16, 08/27/16, 09/04/16   pelvis treated to 55 Gy in 25 fractions, vaginal cuff treated to 24 Gy in 4 fractions  . Hypertension   . Hypothyroidism   . Uterine cancer [facility])    May 30 , 2017    Past Surgical History:  Procedure Laterality Date  . achellis tendon repaired in 2005    . ANKLE SURGERY Right Oct 2006  . fisure  2014  . IR FLUORO GUIDE PORT INSERTION RIGHT  09/10/2017  . IR US GUIDE VASC ACCESS RIGHT  09/10/2017  . LYMPH NODE BIOPSY N/A 01/02/2016   Procedure: SENTINAL LYMPH NODE BIOPSY;  Surgeon: Everitt Amber,  MD;  Location: [facility];  Service: Gynecology;  Laterality: N/A;  . rectal fissure surgery 2010    . REPLACEMENT TOTAL KNEE Left 2015  . ROBOTIC ASSISTED TOTAL HYSTERECTOMY WITH BILATERAL SALPINGO OOPHERECTOMY Bilateral 01/02/2016   Procedure: XI ROBOTIC ASSISTED TOTAL LAPAROSCOPIC HYSTERECTOMY WITH BILATERAL SALPINGO OOPHORECTOMY;  Surgeon: Everitt Amber, MD;  Location: [facility];  Service: Gynecology;  Laterality: Bilateral;  . TUBAL LIGATION      Family History  Problem Relation Age of Onset  . Anesthesia problems Mother   . Uterine cancer Mother 50  . Bladder  Cancer Father 71  . Diabetes Sister   . Rectal cancer Cousin 40       d.69    Social History   Socioeconomic History  . Marital status: Divorced    Spouse name: Not on file  . Number of children: 1  . Years of education: Not on file  . Highest education level: Not on file  Occupational History  . Occupation: retired Ambulance person  . Financial resource strain: Not on file  . Food insecurity:    Worry: Not on file    Inability: Not on file  . Transportation needs:    Medical: Not on file    Non-medical: Not on file  Tobacco Use  . Smoking status: Never Smoker  . Smokeless tobacco: Never Used  Substance and Sexual Activity  . Alcohol use: Yes    Comment: socially  . Drug use: No  . Sexual activity: Never  Lifestyle  . Physical activity:    Days per week: Not on file    Minutes per session: Not on file  . Stress: Not on file  Relationships  . Social connections:    Talks on phone: Not on file    Gets together: Not on file    Attends religious service: Not on file    Active member of club or organization: Not on file    Attends meetings of clubs or organizations: Not on file    Relationship status: Not on file  . Intimate partner violence:    Fear of current or ex partner: Not on file    Emotionally abused: Not on file    Physically abused: Not on file    Forced sexual activity: Not on file  Other Topics Concern  . Not on file  Social History Narrative  . Not on file    Past Medical History, Surgical history, Social history, and Family history were reviewed and updated as appropriate.   Please see review of systems for further details on the patient's review from today.   Review of Systems:  Review of Systems  Constitutional: Negative for appetite change, chills, diaphoresis, fever and unexpected weight change.  Gastrointestinal: Positive for constipation. Negative for abdominal distention, abdominal pain, anal bleeding, blood in stool, diarrhea,  nausea, rectal pain and vomiting.    Objective:   Physical Exam:  There were no vitals taken for this visit. ECOG: 0  Physical Exam  Constitutional: No distress.  HENT:  Head: Normocephalic and atraumatic.  Cardiovascular: Normal rate, regular rhythm and normal heart sounds. Exam reveals no gallop and no friction rub.  No murmur heard. Pulmonary/Chest: Effort normal and breath sounds normal. No respiratory distress. She has no wheezes. She has no rales.  Abdominal: Soft. Bowel sounds are normal. She exhibits no distension and no mass. There is no tenderness. There is no rebound and no guarding.  Musculoskeletal: She exhibits no edema.  Neurological: She  is alert.  Skin: Skin is warm and dry. She is not diaphoretic.    Lab Review:     Component Value Date/Time   NA 137 01/05/2018 1208   NA 141 07/09/2017 1312   K 3.9 01/05/2018 1208   K 3.9 07/09/2017 1312   CL 101 01/05/2018 1208   CO2 29 01/05/2018 1208   CO2 27 07/09/2017 1312   GLUCOSE 90 01/05/2018 1208   GLUCOSE 109 07/09/2017 1312   BUN 16 01/05/2018 1208   BUN 16.4 07/09/2017 1312   CREATININE 0.72 01/05/2018 1208   CREATININE 0.8 07/09/2017 1312   CALCIUM 10.2 01/05/2018 1208   CALCIUM 9.2 07/09/2017 1312   PROT 6.9 01/05/2018 1208   PROT 7.1 07/09/2017 1312   ALBUMIN 3.2 (L) 01/05/2018 1208   ALBUMIN 3.1 (L) 07/09/2017 1312   AST 16 01/05/2018 1208   AST 35 (H) 07/09/2017 1312   ALT 13 01/05/2018 1208   ALT 43 07/09/2017 1312   ALKPHOS 178 (H) 01/05/2018 1208   ALKPHOS 182 (H) 07/09/2017 1312   BILITOT 0.5 01/05/2018 1208   BILITOT 0.34 07/09/2017 1312   GFRNONAA >60 01/05/2018 1208   GFRAA >60 01/05/2018 1208       Component Value Date/Time   WBC 5.5 01/05/2018 1208   WBC 10.8 (H) 12/21/2017 2025   RBC 3.50 (L) 01/05/2018 1208   HGB 9.3 (L) 01/05/2018 1208   HGB 10.9 (L) 07/09/2017 1312   HCT 28.0 (L) 01/05/2018 1208   HCT 32.4 (L) 07/09/2017 1312   PLT 222 01/05/2018 1208   PLT 118 (L)  07/09/2017 1312   MCV 79.9 01/05/2018 1208   MCV 79.2 (L) 07/09/2017 1312   MCH 26.7 01/05/2018 1208   MCHC 33.4 01/05/2018 1208   RDW 18.1 (H) 01/05/2018 1208   RDW 12.5 07/09/2017 1312   LYMPHSABS 0.8 (L) 01/05/2018 1208   LYMPHSABS 1.0 07/09/2017 1312   MONOABS 0.4 01/05/2018 1208   MONOABS 0.4 07/09/2017 1312   EOSABS 0.1 01/05/2018 1208   EOSABS 0.2 07/09/2017 1312   BASOSABS 0.0 01/05/2018 1208   BASOSABS 0.0 07/09/2017 1312   -------------------------------  Imaging from last 24 hours (if applicable):  Radiology interpretation: Ct Renal Stone Study  Result Date: 12/21/2017 CLINICAL DATA:  Left low back pain EXAM: CT ABDOMEN AND PELVIS WITHOUT CONTRAST TECHNIQUE: Multidetector CT imaging of the abdomen and pelvis was performed following the standard protocol without IV contrast. COMPARISON:  CT 12/03/2017, 05/26/2017, 04/02/2017 FINDINGS: Lower chest: Lung bases demonstrate no acute consolidation or effusion. Borderline heart size. Hepatobiliary: Multiple hypodense liver lesions compatible with history of metastatic disease. Some of the lesions are suspected to be increased in size but further characterisation limited without contrast. No calcified gallstone or biliary dilatation Pancreas: Unremarkable. No pancreatic ductal dilatation or surrounding inflammatory changes. Spleen: Normal in size without focal abnormality. Adrenals/Urinary Tract: Adrenal glands are within normal limits. Moderate left hydronephrosis and hydroureter, similar compared to prior. No definite ureteral stones. The bladder is normal. Stomach/Bowel: The stomach is nonenlarged. No dilated small bowel. Negative appendix Vascular/Lymphatic: Nonaneurysmal aorta. Mild aortic atherosclerosis. Reproductive: Status post hysterectomy. Other: 5.3 x 4 cm left pelvic mass, increased from prior measurement of 5.1 x 3.8 cm. Left ureteral obstruction is suspected to be secondary to the pelvic mass. The mass at least abuts and  questionably invades the sigmoid colon. Musculoskeletal: Degenerative changes.  No acute interval findings. IMPRESSION: 1. Moderate left hydronephrosis and hydroureter, similar compared to most recent CT from May 2019; obstruction appears  to be secondary to a left pelvic mass/metastatic focus which has increased in size since the prior CT. 2. Numerous hepatic metastatic lesions, suspect that some may be increased in size but difficult to further characterize without intravenous contrast. Increased size of left pelvic soft tissue mass/metastatic lesion. Mass abuts and possibly invades the sigmoid colon. Electronically Signed   By: Donavan Foil M.D.   On: 12/21/2017 21:44

## 2018-01-16 IMAGING — CT CT ABD-PELV W/ CM
2 of 5 series · 16 of 46 positions shown, 18 images · IV contrast (iopamidol)
Comparison: 12/30/2016

CLINICAL DATA: Staging endometrial carcinoma. Assess for
progression of disease.

EXAM:
CT ABDOMEN AND PELVIS WITH CONTRAST
TECHNIQUE: Multidetector CT imaging of the abdomen and pelvis was performed
using the standard protocol following bolus administration of
intravenous contrast.
CONTRAST:  100mL 1HWKUR-HKK IOPAMIDOL (1HWKUR-HKK) INJECTION 61%

[Series 2: axial st · axial · 0.78mm/px · z∈[-450,-65]mm · 13 of 91 slices shown, 15 images]
[im 7/91  soft-tissue]
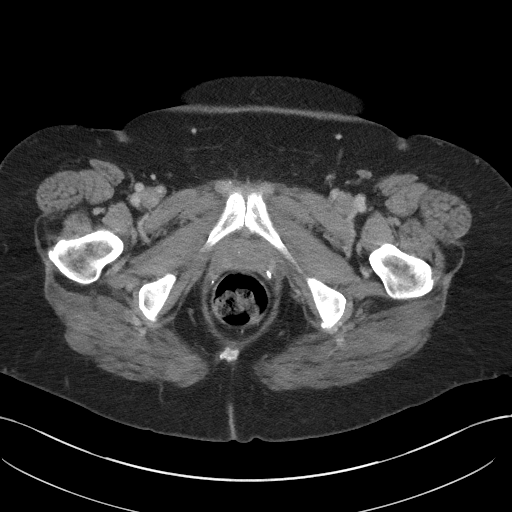
[im 7/91  bone]
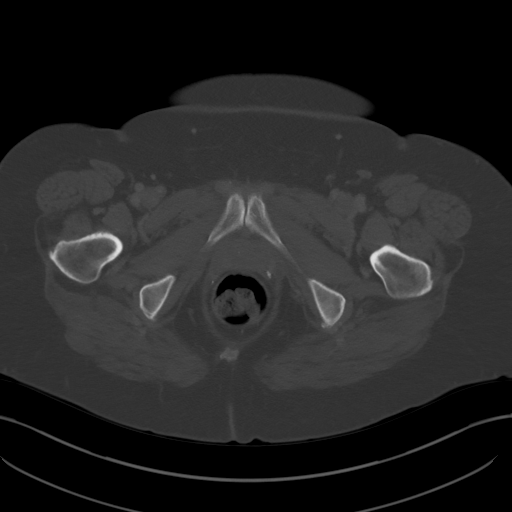
[im 13/91  soft-tissue]
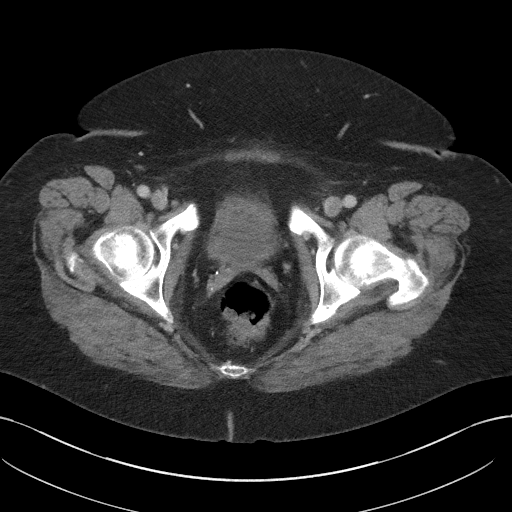
[im 20/91  soft-tissue]
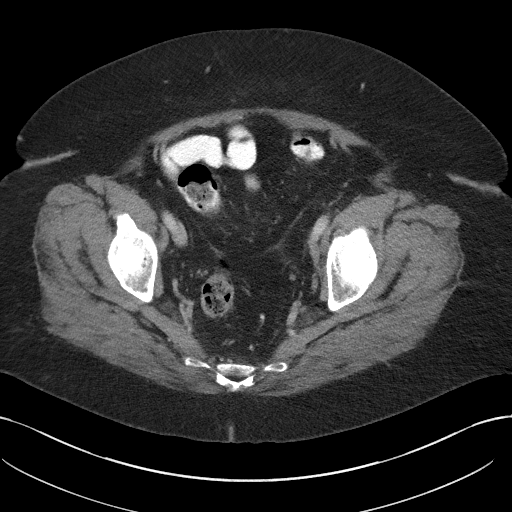
[im 26/91  soft-tissue]
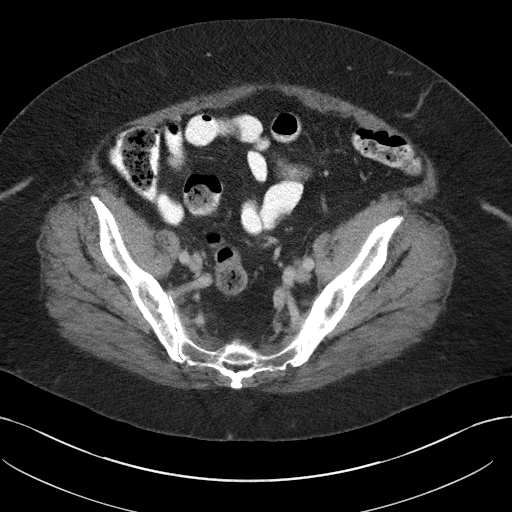
[im 33/91  soft-tissue]
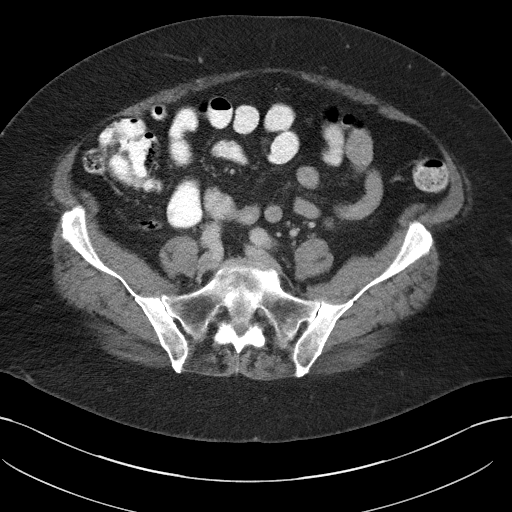
[im 39/91  soft-tissue]
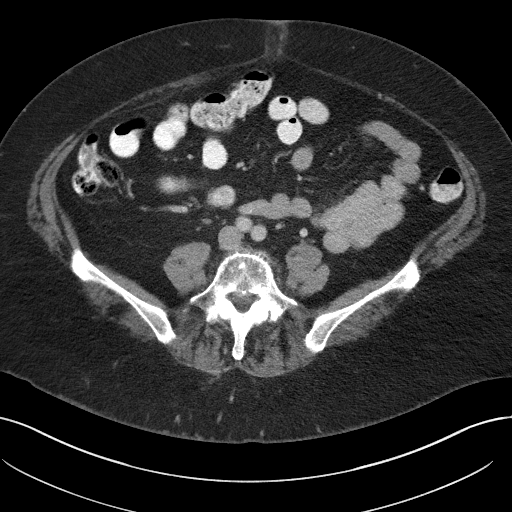
[im 46/91  soft-tissue]
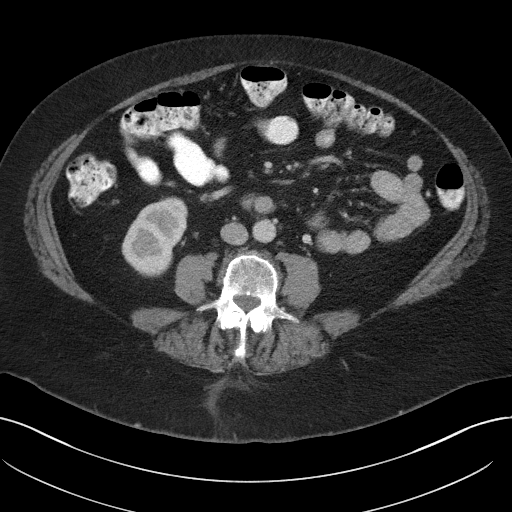
[im 52/91  soft-tissue]
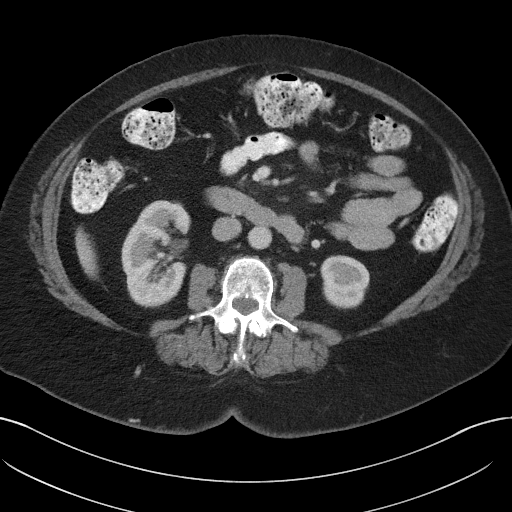
[im 58/91  soft-tissue]
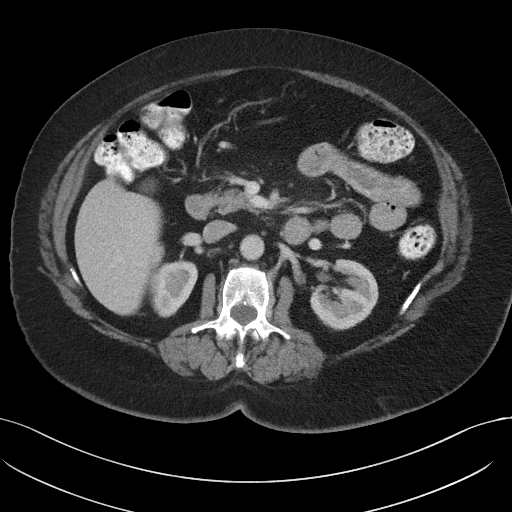
[im 58/91  bone]
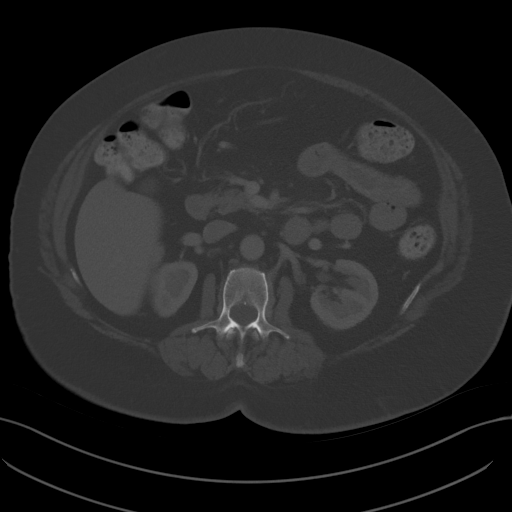
[im 65/91  soft-tissue]
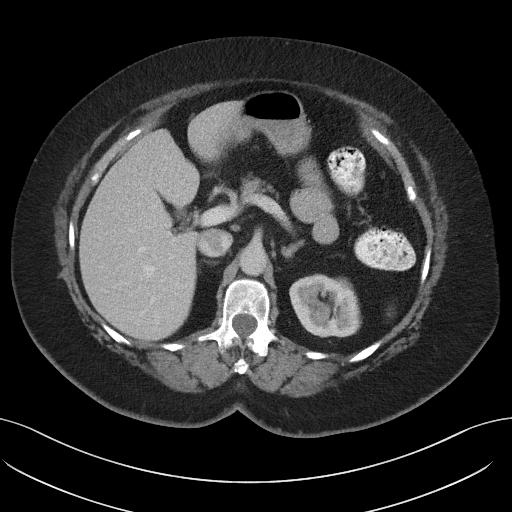
[im 71/91  soft-tissue]
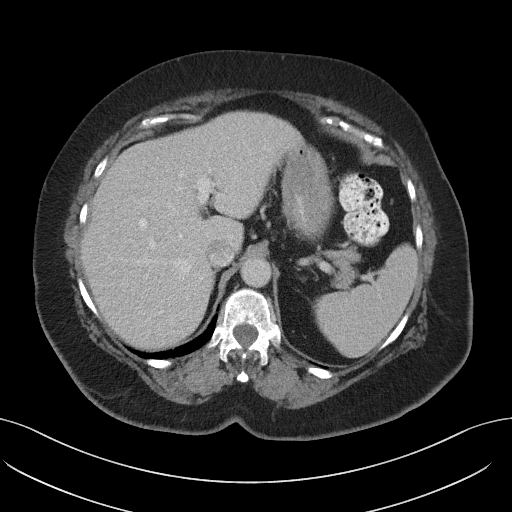
[im 78/91  soft-tissue]
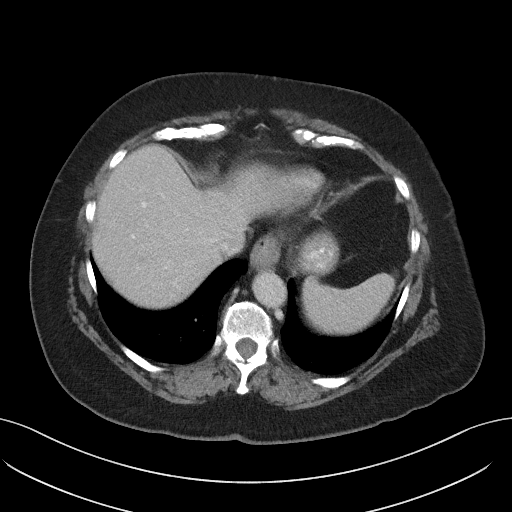
[im 84/91  soft-tissue]
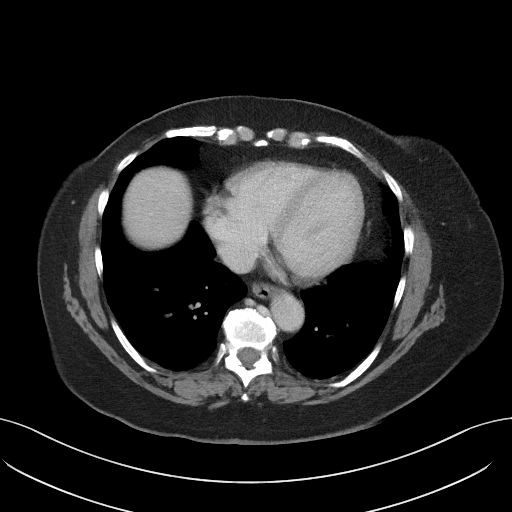

[Series 4: coronal st · coronal · 0.80mm/px · 3 of 110 slices shown]
[im 37/110  soft-tissue]
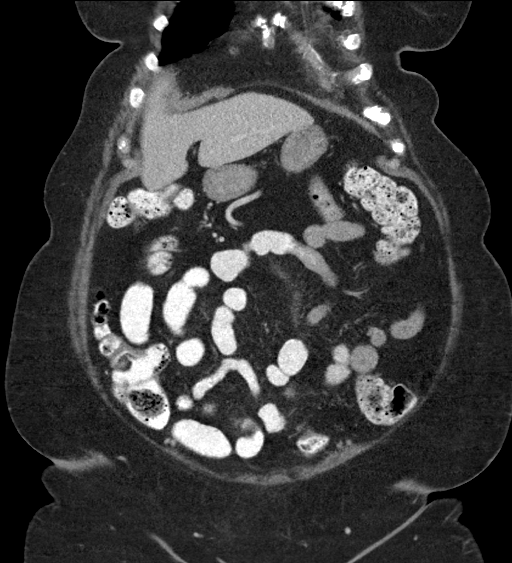
[im 49/110  soft-tissue]
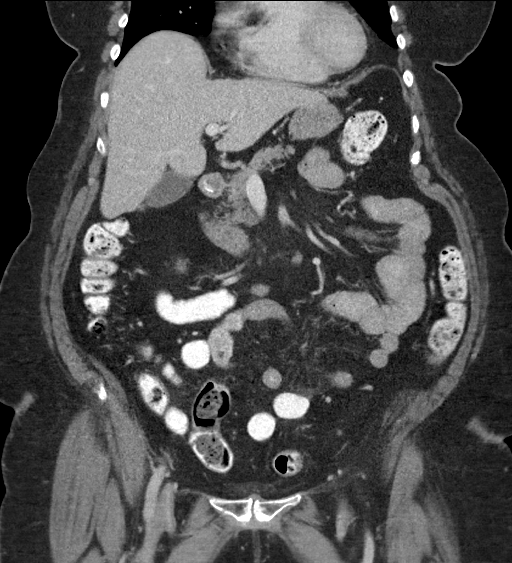
[im 61/110  soft-tissue]
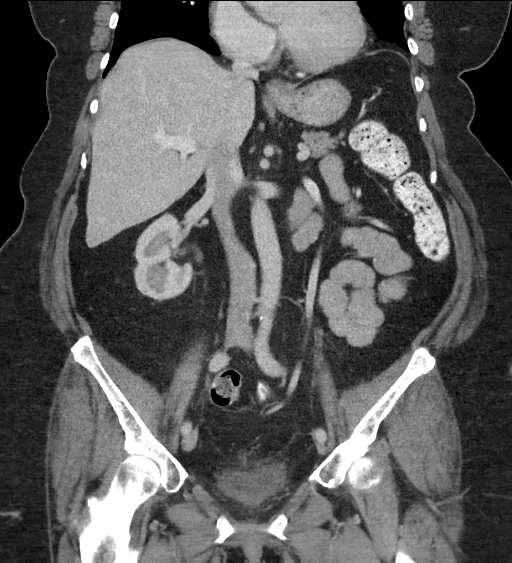

[16 of 46 positions shown; findings below may reference images not displayed]

FINDINGS: Lower chest: The lung bases are clear. No pleural or pericardial
effusion identified.

Hepatobiliary: Stable 8 mm low-attenuation structure in the right
lobe of liver, image 15 of series 2. The gallbladder appears normal.
No biliary dilatation.

Pancreas: The pancreas is unremarkable.

Spleen: Normal appearance of the spleen.

Adrenals/Urinary Tract: Normal appearance of the adrenal glands. The
kidneys are both unremarkable. No hydronephrosis or mass. There is
mild diffuse bladder wall thickening, similar to previous exam.

Stomach/Bowel: The stomach appears normal. The small bowel loops
have a normal course and caliber. The appendix is visualized and
appears normal. Unremarkable appearance of the colon

Vascular/Lymphatic: Aortic atherosclerosis. No aneurysm. No upper
abdominal adenopathy. No pelvic or inguinal adenopathy.

Reproductive: Status post hysterectomy. No adnexal masses.

Other: Left lower quadrant peritoneal implant referenced on previous
exam measures 1.7 x 1.3 cm, image 69 of series 2. Increased from
x 0.8 cm previously peer no new peritoneal implants identified.

Musculoskeletal: The degenerative disc disease noted within the
lumbar spine.
IMPRESSION: 1. Solitary left pelvic peritoneal implant is increased in size in
the interval.
2. No new sites of disease.  No ascites.
3. Aortic atherosclerosis.

## 2018-01-26 ENCOUNTER — Inpatient Hospital Stay: Payer: Medicare Other

## 2018-01-26 ENCOUNTER — Encounter: Payer: Self-pay | Admitting: Hematology and Oncology

## 2018-01-26 ENCOUNTER — Inpatient Hospital Stay (HOSPITAL_BASED_OUTPATIENT_CLINIC_OR_DEPARTMENT_OTHER): Payer: Medicare Other | Admitting: Hematology and Oncology

## 2018-01-26 VITALS — BP 143/69 | HR 77 | Temp 97.8°F | Resp 18 | Ht 65.5 in | Wt 184.6 lb

## 2018-01-26 DIAGNOSIS — C541 Malignant neoplasm of endometrium: Secondary | ICD-10-CM

## 2018-01-26 DIAGNOSIS — G2581 Restless legs syndrome: Secondary | ICD-10-CM | POA: Diagnosis not present

## 2018-01-26 DIAGNOSIS — Z5112 Encounter for antineoplastic immunotherapy: Secondary | ICD-10-CM | POA: Diagnosis not present

## 2018-01-26 DIAGNOSIS — D638 Anemia in other chronic diseases classified elsewhere: Secondary | ICD-10-CM

## 2018-01-26 DIAGNOSIS — D509 Iron deficiency anemia, unspecified: Secondary | ICD-10-CM

## 2018-01-26 DIAGNOSIS — G893 Neoplasm related pain (acute) (chronic): Secondary | ICD-10-CM

## 2018-01-26 DIAGNOSIS — K5909 Other constipation: Secondary | ICD-10-CM | POA: Insufficient documentation

## 2018-01-26 DIAGNOSIS — E039 Hypothyroidism, unspecified: Secondary | ICD-10-CM

## 2018-01-26 DIAGNOSIS — D6481 Anemia due to antineoplastic chemotherapy: Secondary | ICD-10-CM

## 2018-01-26 DIAGNOSIS — T451X5A Adverse effect of antineoplastic and immunosuppressive drugs, initial encounter: Secondary | ICD-10-CM

## 2018-01-26 LAB — CBC WITH DIFFERENTIAL (CANCER CENTER ONLY)
Basophils Absolute: 0 10*3/uL (ref 0.0–0.1)
Basophils Relative: 1 %
Eosinophils Absolute: 0.2 10*3/uL (ref 0.0–0.5)
Eosinophils Relative: 4 %
HCT: 33.5 % — ABNORMAL LOW (ref 34.8–46.6)
HEMOGLOBIN: 10.8 g/dL — AB (ref 11.6–15.9)
Lymphocytes Relative: 19 %
Lymphs Abs: 1.1 10*3/uL (ref 0.9–3.3)
MCH: 26.9 pg (ref 25.1–34.0)
MCHC: 32.2 g/dL (ref 31.5–36.0)
MCV: 83.3 fL (ref 79.5–101.0)
MONOS PCT: 10 %
Monocytes Absolute: 0.6 10*3/uL (ref 0.1–0.9)
Neutro Abs: 3.9 10*3/uL (ref 1.5–6.5)
Neutrophils Relative %: 66 %
Platelet Count: 181 10*3/uL (ref 145–400)
RBC: 4.02 MIL/uL (ref 3.70–5.45)
RDW: 15.1 % — ABNORMAL HIGH (ref 11.2–14.5)
WBC Count: 5.9 10*3/uL (ref 3.9–10.3)

## 2018-01-26 LAB — CMP (CANCER CENTER ONLY)
ALT: 14 U/L (ref 0–44)
ANION GAP: 7 (ref 5–15)
AST: 15 U/L (ref 15–41)
Albumin: 3.3 g/dL — ABNORMAL LOW (ref 3.5–5.0)
Alkaline Phosphatase: 129 U/L — ABNORMAL HIGH (ref 38–126)
BUN: 19 mg/dL (ref 8–23)
CO2: 27 mmol/L (ref 22–32)
Calcium: 9.5 mg/dL (ref 8.9–10.3)
Chloride: 106 mmol/L (ref 98–111)
Creatinine: 0.73 mg/dL (ref 0.44–1.00)
GFR, Estimated: >60 mL/min (ref >60)
Glucose, Bld: 99 mg/dL (ref 70–99)
POTASSIUM: 3.7 mmol/L (ref 3.5–5.1)
SODIUM: 140 mmol/L (ref 135–145)
Total Bilirubin: 0.3 mg/dL (ref 0.3–1.2)
Total Protein: 6.7 g/dL (ref 6.5–8.1)

## 2018-01-26 LAB — TSH: TSH: 1.039 u[IU]/mL (ref 0.308–3.960)

## 2018-01-26 MED ORDER — SODIUM CHLORIDE 0.9 % IV SOLN
200.0000 mg | Freq: Once | INTRAVENOUS | Status: AC
  Administered 2018-01-26: 200 mg via INTRAVENOUS
  Filled 2018-01-26: qty 8

## 2018-01-26 MED ORDER — SODIUM CHLORIDE 0.9% FLUSH
10.0000 mL | Freq: Once | INTRAVENOUS | Status: AC
  Administered 2018-01-26: 10 mL
  Filled 2018-01-26: qty 10

## 2018-01-26 MED ORDER — SODIUM CHLORIDE 0.9% FLUSH
10.0000 mL | INTRAVENOUS | Status: DC | PRN
  Administered 2018-01-26: 10 mL
  Filled 2018-01-26: qty 10

## 2018-01-26 MED ORDER — HEPARIN SOD (PORK) LOCK FLUSH 100 UNIT/ML IV SOLN
500.0000 [IU] | Freq: Once | INTRAVENOUS | Status: AC | PRN
  Administered 2018-01-26: 500 [IU]
  Filled 2018-01-26: qty 5

## 2018-01-26 MED ORDER — SODIUM CHLORIDE 0.9 % IV SOLN
Freq: Once | INTRAVENOUS | Status: AC
  Administered 2018-01-26: 14:00:00 via INTRAVENOUS

## 2018-01-26 MED ORDER — DRONABINOL 2.5 MG PO CAPS: 2.5000 mg | ORAL_CAPSULE | Freq: Two times a day (BID) | ORAL | 0 refills | Status: DC

## 2018-01-26 MED ORDER — LORAZEPAM 1 MG PO TABS: 1.0000 mg | ORAL_TABLET | Freq: Three times a day (TID) | ORAL | 0 refills | Status: DC | PRN

## 2018-01-26 NOTE — Progress Notes (Signed)
[facility] OFFICE PROGRESS NOTE  Patient Care Team: [name], MD as PCP - General (Family Medicine)  ASSESSMENT & PLAN:  Recurrent carcinoma of endometrium [facility]) She is tolerating recent treatment very well She has no symptomatic pain I recommend we continue treatment as schedule I will repeat imaging study again next month for further follow-up  Iron deficiency anemia She has multifactorial anemia, combination of iron deficiency along with anemia chronic illness Since recent intravenous iron infusion, her blood count has improved and she has excellent energy level We will observe closely and she will continue intermittent iron infusion as needed  Cancer associated pain She has stable pain control with current prescription pain medicine In fact, she was able to reduce MS Contin to once a day along with Dilaudid for breakthrough pain medicine We will continue the same  Chronic constipation She has chronic constipation We had extensive discussion about laxative therapy  Restless leg syndrome She has chronic restless leg syndrome I recommend simplifying her regimen to stick with lorazepam as needed only and to stop using clonazepam when her prescription runs out  Acquired hypothyroidism She has intermittent fluctuation of TSH She admits she has not been compliant taking her thyroid medicine as prescribed We discussed strategies to improve compliance Repeat TSH is adequate   Orders Placed This Encounter  Procedures  . Ferritin    Standing Status:   Standing    Number of Occurrences:   9    Standing Expiration Date:   01/27/2019  . Iron and TIBC    Standing Status:   Standing    Number of Occurrences:   9    Standing Expiration Date:   01/27/2019    INTERVAL HISTORY: Please see below for problem oriented charting. She returns with her sister for further follow-up She feels well She tolerated immunotherapy well She denies pain She was able to  reduce MS Contin to once a day and rarely takes Dilaudid as needed for breakthrough pain Her appetite is fair She continues to have intermittent constipation Recently, due to severe constipation, she had significant hematochezia after bowel movement but that resolved spontaneously She has intermittent restless leg syndrome She denies nausea.  Her weight is stable with current prescription of Marinol She has intermittent restless leg syndrome and it wakes her up from sleep  SUMMARY OF ONCOLOGIC HISTORY: Oncology History   MSI stable on June 2017 tissue but high from [facility] study from November 2017  05/15/16: ER is moderately positive (70%). PR is strongly positive (80%).     Recurrent carcinoma of endometrium ([place])   01/02/2016 Pathology Results    Uterus +/- tubes/ovaries, neoplastic, cervix ENDOMETRIAL ADENOCARCINOMA, FIGO GRADE 2 (4.7 CM) THE TUMOR INVADES LESS THAN ONE-HALF OF THE MYOMETRIUM (PT1A) ALL MARGINS OF RESECTION ARE NEGATIVE FOR CARCINOMA LEIOMYOMAS AND ADENOMYOSIS BILATERAL FALLOPIAN TUBES AND OVARIES: HISTOLOGICAL UNREMARKABLE 2. Lymph node, sentinel, biopsy, right obturator ONE BENIGN LYMPH NODE (0/1) 3. Lymph nodes, regional resection, left pelvic FOUR BENIGN LYMPH NODES (0/4)      01/02/2016 Surgery    Dr. Denman George performed robotic-assisted laparoscopic total hysterectomy with bilateral salpingoophorectomy, sentinel lymph node biopsy, lymphadenectomy        05/15/2016 Pathology Results    Vagina, biopsy, mid - ADENOCARCINOMA, SEE COMMENT. Microscopic Comment The morphology along with the patient's history are consistent with recurrent endometrioid adenocarcinoma. The carcinoma has a similar appearance to the primary ([id number]).      05/20/2016 Imaging    Ct scan  abdomen showed solid 2.5 cm peritoneal mass in the mid to anterior left pelvis, suspicious for peritoneal metastasis. 2. Small expansile low-attenuation filling defect in the left external  iliac vein, cannot exclude a small deep venous thrombus. Consider correlation with left lower extremity venous Doppler scan. 3. Small simple fluid density structure in the left pelvic sidewall abutting the left external iliac vessels, favor a small postoperative seroma. 4. No ascites. 5. No lymphadenopathy.  No metastatic disease in the chest. 6. Aortic atherosclerosis.      06/12/2016 PET scan    Intensely hypermetabolic 2.1 cm central pelvic peritoneal mass just to the left of midline, consistent with peritoneal metastatic recurrence. No ascites. 2. No additional hypermetabolic sites of metastatic disease. 3. Diffuse thyroid hypermetabolism without discrete thyroid nodule, favoring thyroiditis. Recommend correlation with serum thyroid function tests.      06/24/2016 - 07/22/2016 Chemotherapy    The patient had weekly cisplatin. She has missed several doses due to infection and pancytopenia      06/24/2016 - 09/04/2016 Radiation Therapy    She completed concurrent radiation therapy Radiation treatment dates:   IMRT : 06/24/16 - 08/01/16 HDR : 08/13/16, 08/20/16, 08/27/16, 09/04/16  Site/dose:   Pelvis treated to 55 Gy in 25 fractions (simultaneous integrated boost technique) Vaginal Cuff treated to 24 Gy in 4 fractions      08/15/2016 - 12/03/2016 Chemotherapy    She received 6 cycles of carboplatin/Taxol      09/27/2016 Imaging    Interval decrease in size of previously described solid peritoneal nodule within the left anterior pelvis. Near complete resolution of previously described low-attenuation structure along the left pelvic sidewall. No evidence for metastatic disease in the chest. Aortic atherosclerosis.      12/30/2016 Imaging    Ct abdomen 1. Solitary left pelvic peritoneal implant is mildly decreased in size in the interval. 2. No new or progressive metastatic disease in the abdomen or pelvis. No ascites. 3. Aortic atherosclerosis.      04/02/2017 Imaging    Left lower quadrant  peritoneal implant referenced on previous exam measures 1.7 x 1.3 cm, image 69 of series 2. Increased from 0.8 x 0.8 cm previously peer no new peritoneal implants identified.  Musculoskeletal: The degenerative disc disease noted within the lumbar spine.  IMPRESSION: 1. Solitary left pelvic peritoneal implant is increased in size in the interval. 2. No new sites of disease.  No ascites. 3. Aortic atherosclerosis      04/11/2017 PET scan    1. The left side of pelvis peritoneal implant has decreased in size and degree of FDG uptake compatible with response to therapy. No new areas of peritoneal disease identified. 2. Persistent diffuse increased uptake within the thyroid gland. Correlation with patient's thyroid function may be helpful.      05/26/2017 Imaging    1. Enlarging tumor implant along the left adnexa, currently 2.6 by 2.4 cm and previously 1.9 by 1.3 cm. No new tumor implant or other specific cause for the patient's pelvic symptoms is currently identified. 2.  Aortic Atherosclerosis (ICD10-I70.0). 3. Lumbar spondylosis and degenerative disc disease causing multilevel impingement.      06/04/2017 - 08/28/2017 Chemotherapy    The patient started letrozole and everolimus      08/26/2017 Imaging    Increased size of mass in the left adnexal region.  Several new small liver metastases in right hepatic lobe      09/08/2017 Imaging    LV EF: 60% -  65%  09/10/2017 Procedure    Technically successful right IJ power-injectable port catheter placement. Ready for routine use      12/04/2017 Imaging    1. Interval increase in size and number of multiple lesions within the liver compatible with hepatic metastatic disease. 2. Interval increase in size mass within the left hemipelvis.      12/15/2017 -  Chemotherapy    The patient had pembrolizumab (KEYTRUDA) 200 mg in sodium chloride 0.9 % 50 mL chemo infusion, 200 mg, Intravenous, Once, 1 of 6 cycles Administration: 200  mg (12/15/2017)  for chemotherapy treatment.       12/21/2017 Imaging    1. Moderate left hydronephrosis and hydroureter, similar compared to most recent CT from May 2019; obstruction appears to be secondary to a left pelvic mass/metastatic focus which has increased in size since the prior CT. 2. Numerous hepatic metastatic lesions, suspect that some may be increased in size but difficult to further characterize without intravenous contrast. Increased size of left pelvic soft tissue mass/metastatic lesion. Mass abuts and possibly invades the sigmoid colon.        REVIEW OF SYSTEMS:   Constitutional: Denies fevers, chills or abnormal weight loss Eyes: Denies blurriness of vision Ears, nose, mouth, throat, and face: Denies mucositis or sore throat Respiratory: Denies cough, dyspnea or wheezes Cardiovascular: Denies palpitation, chest discomfort or lower extremity swelling Skin: Denies abnormal skin rashes Lymphatics: Denies new lymphadenopathy or easy bruising Neurological:Denies numbness, tingling or new weaknesses Behavioral/Psych: Mood is stable, no new changes  All other systems were reviewed with the patient and are negative.  I have reviewed the past medical history, past surgical history, social history and family history with the patient and they are unchanged from previous note.  ALLERGIES:  is allergic to latex.  MEDICATIONS:  Current Outpatient Medications  Medication Sig Dispense Refill  . acetaminophen (TYLENOL) 325 MG tablet Take 650 mg by mouth every 6 (six) hours as needed for mild pain or moderate pain.     Marland Kitchen amoxicillin (AMOXIL) 500 MG capsule Take 2,000 mg by mouth See admin instructions. Prior to dental appointment, takes 4 tables prior to appt    . Biotin w/ Vitamins C & E (HAIR/SKIN/NAILS PO) Take 1 tablet by mouth daily.    . Calcium Carb-Cholecalciferol (CALCIUM 600 + D PO) Take 2 tablets by mouth daily.    . celecoxib (CELEBREX) 200 MG capsule Take 1 capsule  twice a day with food as needed for chest wall pain.    Marland Kitchen dronabinol (MARINOL) 2.5 MG capsule Take 1 capsule (2.5 mg total) by mouth 2 (two) times daily before lunch and supper. 60 capsule 0  . fluticasone (FLONASE) 50 MCG/ACT nasal spray Place 1 spray into both nostrils 2 (two) times daily as needed for allergies.   1  . gabapentin (NEURONTIN) 600 MG tablet TAKE 1 TABLET BY MOUTH TWICE A DAY (Patient taking differently: Take 1 tablet 2 times a day) 60 tablet 9  . HYDROmorphone (DILAUDID) 4 MG tablet Take 1 tablet (4 mg total) by mouth every 4 (four) hours as needed for severe pain. 60 tablet 0  . ibuprofen (ADVIL,MOTRIN) 200 MG tablet Take 400 mg by mouth every 8 (eight) hours as needed for mild pain.    Marland Kitchen levothyroxine (SYNTHROID, LEVOTHROID) 88 MCG tablet Take 88 mcg by mouth daily.  3  . loratadine (CLARITIN) 10 MG tablet Take 10 mg by mouth daily.    Marland Kitchen LORazepam (ATIVAN) 1 MG tablet Take 1 tablet (1  mg total) by mouth every 8 (eight) hours as needed for anxiety or sleep. 90 tablet 0  . Magnesium 250 MG TABS Take 250 mg by mouth daily.    . Melatonin 10 MG CAPS Take 10 mg by mouth at bedtime.    Marland Kitchen morphine (MS CONTIN) 30 MG 12 hr tablet Take 1 tablet (30 mg total) by mouth every 12 (twelve) hours. 60 tablet 0  . ondansetron (ZOFRAN) 8 MG tablet Take 1 tablet (8 mg total) by mouth every 8 (eight) hours as needed for nausea. 60 tablet 1  . traZODone (DESYREL) 50 MG tablet Take 1.5 tablets (75 mg total) by mouth at bedtime. (Patient taking differently: Take 50 mg by mouth at bedtime. ) 135 tablet 1   No current facility-administered medications for this visit.    Facility-Administered Medications Ordered in Other Visits  Medication Dose Route Frequency Provider Last Rate Last Dose  . 0.9 %  sodium chloride infusion   Intravenous Once Alvy Bimler, , MD      . heparin lock flush 100 unit/mL  500 Units Intracatheter Once PRN Alvy Bimler, , MD      . pembrolizumab (KEYTRUDA) 200 mg in sodium chloride  0.9 % 50 mL chemo infusion  200 mg Intravenous Once , , MD      . sodium chloride flush (NS) 0.9 % injection 10 mL  10 mL Intracatheter PRN Alvy Bimler, , MD        PHYSICAL EXAMINATION: ECOG PERFORMANCE STATUS: 1 - Symptomatic but completely ambulatory  Vitals:   01/26/18 1227  BP: (!) 143/69  Pulse: 77  Resp: 18  Temp: 97.8 F (36.6 C)  SpO2: 100%   Filed Weights   01/26/18 1227  Weight: 184 lb 9.6 oz (83.7 kg)    GENERAL:alert, no distress and comfortable SKIN: skin color, texture, turgor are normal, no rashes or significant lesions EYES: normal, Conjunctiva are pink and non-injected, sclera clear OROPHARYNX:no exudate, no erythema and lips, buccal mucosa, and tongue normal  NECK: supple, thyroid normal size, non-tender, without nodularity LYMPH:  no palpable lymphadenopathy in the cervical, axillary or inguinal LUNGS: clear to auscultation and percussion with normal breathing effort HEART: regular rate & rhythm and no murmurs and no lower extremity edema ABDOMEN:abdomen soft, non-tender and normal bowel sounds Musculoskeletal:no cyanosis of digits and no clubbing  NEURO: alert & oriented x 3 with fluent speech, no focal motor/sensory deficits  LABORATORY DATA:  I have reviewed the data as listed    Component Value Date/Time   NA 140 01/26/2018 1142   NA 141 07/09/2017 1312   K 3.7 01/26/2018 1142   K 3.9 07/09/2017 1312   CL 106 01/26/2018 1142   CO2 27 01/26/2018 1142   CO2 27 07/09/2017 1312   GLUCOSE 99 01/26/2018 1142   GLUCOSE 109 07/09/2017 1312   BUN 19 01/26/2018 1142   BUN 16.4 07/09/2017 1312   CREATININE 0.73 01/26/2018 1142   CREATININE 0.8 07/09/2017 1312   CALCIUM 9.5 01/26/2018 1142   CALCIUM 9.2 07/09/2017 1312   PROT 6.7 01/26/2018 1142   PROT 7.1 07/09/2017 1312   ALBUMIN 3.3 (L) 01/26/2018 1142   ALBUMIN 3.1 (L) 07/09/2017 1312   AST 15 01/26/2018 1142   AST 35 (H) 07/09/2017 1312   ALT 14 01/26/2018 1142   ALT 43 07/09/2017  1312   ALKPHOS 129 (H) 01/26/2018 1142   ALKPHOS 182 (H) 07/09/2017 1312   BILITOT 0.3 01/26/2018 1142   BILITOT 0.34 07/09/2017 1312   GFRNONAA >  60 01/26/2018 1142   GFRAA >60 01/26/2018 1142    No results found for: SPEP, UPEP  Lab Results  Component Value Date   WBC 5.9 01/26/2018   NEUTROABS 3.9 01/26/2018   HGB 10.8 (L) 01/26/2018   HCT 33.5 (L) 01/26/2018   MCV 83.3 01/26/2018   PLT 181 01/26/2018      Chemistry      Component Value Date/Time   NA 140 01/26/2018 1142   NA 141 07/09/2017 1312   K 3.7 01/26/2018 1142   K 3.9 07/09/2017 1312   CL 106 01/26/2018 1142   CO2 27 01/26/2018 1142   CO2 27 07/09/2017 1312   BUN 19 01/26/2018 1142   BUN 16.4 07/09/2017 1312   CREATININE 0.73 01/26/2018 1142   CREATININE 0.8 07/09/2017 1312      Component Value Date/Time   CALCIUM 9.5 01/26/2018 1142   CALCIUM 9.2 07/09/2017 1312   ALKPHOS 129 (H) 01/26/2018 1142   ALKPHOS 182 (H) 07/09/2017 1312   AST 15 01/26/2018 1142   AST 35 (H) 07/09/2017 1312   ALT 14 01/26/2018 1142   ALT 43 07/09/2017 1312   BILITOT 0.3 01/26/2018 1142   BILITOT 0.34 07/09/2017 1312      All questions were answered. The patient knows to call the clinic with any problems, questions or concerns. No barriers to learning was detected.  I spent 25 minutes counseling the patient face to face. The total time spent in the appointment was 30 minutes and more than 50% was on counseling and review of test results  Heath Lark, MD 01/26/2018 1:35 PM

## 2018-01-26 NOTE — Assessment & Plan Note (Signed)
She has chronic constipation We had extensive discussion about laxative therapy

## 2018-01-26 NOTE — Assessment & Plan Note (Signed)
She is tolerating recent treatment very well She has no symptomatic pain I recommend we continue treatment as schedule I will repeat imaging study again next month for further follow-up

## 2018-01-26 NOTE — Patient Instructions (Signed)
[facility] Discharge Instructions for Patients Receiving Chemotherapy  Today you received the following chemotherapy agents keytruda   To help prevent nausea and vomiting after your treatment, we encourage you to take your nausea medication as directed  If you develop nausea and vomiting that is not controlled by your nausea medication, call the clinic.   BELOW ARE SYMPTOMS THAT SHOULD BE REPORTED IMMEDIATELY:  *FEVER GREATER THAN 100.5 F  *CHILLS WITH OR WITHOUT FEVER  NAUSEA AND VOMITING THAT IS NOT CONTROLLED WITH YOUR NAUSEA MEDICATION  *UNUSUAL SHORTNESS OF BREATH  *UNUSUAL BRUISING OR BLEEDING  TENDERNESS IN MOUTH AND THROAT WITH OR WITHOUT PRESENCE OF ULCERS  *URINARY PROBLEMS  *BOWEL PROBLEMS  UNUSUAL RASH Items with * indicate a potential emergency and should be followed up as soon as possible.  Feel free to call the clinic you have any questions or concerns. The clinic phone number is [phone].  

## 2018-01-26 NOTE — Assessment & Plan Note (Signed)
She has stable pain control with current prescription pain medicine In fact, she was able to reduce MS Contin to once a day along with Dilaudid for breakthrough pain medicine We will continue the same

## 2018-01-26 NOTE — Assessment & Plan Note (Signed)
She has chronic restless leg syndrome I recommend simplifying her regimen to stick with lorazepam as needed only and to stop using clonazepam when her prescription runs out

## 2018-01-26 NOTE — Assessment & Plan Note (Signed)
She has multifactorial anemia, combination of iron deficiency along with anemia chronic illness Since recent intravenous iron infusion, her blood count has improved and she has excellent energy level We will observe closely and she will continue intermittent iron infusion as needed

## 2018-01-26 NOTE — Assessment & Plan Note (Signed)
She has intermittent fluctuation of TSH She admits she has not been compliant taking her thyroid medicine as prescribed We discussed strategies to improve compliance Repeat TSH is adequate

## 2018-02-10 ENCOUNTER — Other Ambulatory Visit: Payer: Self-pay | Admitting: Hematology and Oncology

## 2018-02-11 ENCOUNTER — Telehealth: Payer: Self-pay | Admitting: *Deleted

## 2018-02-11 NOTE — Telephone Encounter (Signed)
-----   Message from Gaspar Bidding sent at 02/11/2018 10:21 AM EDT ----- Regarding: RE: Ct to be done 8/16 not scheduled Referral authorized on 02/03/2018 ----- Message ----- From: Heath Lark, MD Sent: 02/11/2018   9:12 AM To: Patton Salles, RN, Gaspar Bidding Subject: Ct to be done 8/16 not scheduled

## 2018-02-11 NOTE — Telephone Encounter (Signed)
LM with Radiology scheduling phone number for patient to call to arrange CT. Authorized, to be done 8/16.

## 2018-02-20 ENCOUNTER — Inpatient Hospital Stay: Payer: Medicare Other

## 2018-02-20 ENCOUNTER — Inpatient Hospital Stay: Payer: Medicare Other | Attending: Hematology and Oncology

## 2018-02-20 ENCOUNTER — Ambulatory Visit (HOSPITAL_COMMUNITY)
Admission: RE | Admit: 2018-02-20 | Discharge: 2018-02-20 | Disposition: A | Discharge status: Home / Self Care | Payer: Medicare Other | Source: Ambulatory Visit | Attending: Hematology and Oncology | Admitting: Hematology and Oncology

## 2018-02-20 DIAGNOSIS — D6481 Anemia due to antineoplastic chemotherapy: Secondary | ICD-10-CM

## 2018-02-20 DIAGNOSIS — Z79899 Other long term (current) drug therapy: Secondary | ICD-10-CM | POA: Insufficient documentation

## 2018-02-20 DIAGNOSIS — T451X5A Adverse effect of antineoplastic and immunosuppressive drugs, initial encounter: Secondary | ICD-10-CM

## 2018-02-20 DIAGNOSIS — R64 Cachexia: Secondary | ICD-10-CM | POA: Diagnosis not present

## 2018-02-20 DIAGNOSIS — I779 Disorder of arteries and arterioles, unspecified: Secondary | ICD-10-CM | POA: Diagnosis not present

## 2018-02-20 DIAGNOSIS — C787 Secondary malignant neoplasm of liver and intrahepatic bile duct: Secondary | ICD-10-CM | POA: Insufficient documentation

## 2018-02-20 DIAGNOSIS — G893 Neoplasm related pain (acute) (chronic): Secondary | ICD-10-CM | POA: Insufficient documentation

## 2018-02-20 DIAGNOSIS — C541 Malignant neoplasm of endometrium: Secondary | ICD-10-CM

## 2018-02-20 DIAGNOSIS — K5909 Other constipation: Secondary | ICD-10-CM | POA: Insufficient documentation

## 2018-02-20 DIAGNOSIS — N939 Abnormal uterine and vaginal bleeding, unspecified: Secondary | ICD-10-CM | POA: Insufficient documentation

## 2018-02-20 DIAGNOSIS — Z5112 Encounter for antineoplastic immunotherapy: Secondary | ICD-10-CM | POA: Insufficient documentation

## 2018-02-20 DIAGNOSIS — E039 Hypothyroidism, unspecified: Secondary | ICD-10-CM

## 2018-02-20 LAB — CBC WITH DIFFERENTIAL (CANCER CENTER ONLY)
Basophils Absolute: 0.1 10*3/uL (ref 0.0–0.1)
Basophils Relative: 1 %
Eosinophils Absolute: 1.3 10*3/uL — ABNORMAL HIGH (ref 0.0–0.5)
Eosinophils Relative: 15 %
HEMATOCRIT: 37.1 % (ref 34.8–46.6)
HEMOGLOBIN: 12.2 g/dL (ref 11.6–15.9)
LYMPHS PCT: 15 %
Lymphs Abs: 1.3 10*3/uL (ref 0.9–3.3)
MCH: 27.8 pg (ref 25.1–34.0)
MCHC: 32.9 g/dL (ref 31.5–36.0)
MCV: 84.5 fL (ref 79.5–101.0)
MONO ABS: 0.6 10*3/uL (ref 0.1–0.9)
MONOS PCT: 8 %
NEUTROS ABS: 5.2 10*3/uL (ref 1.5–6.5)
Neutrophils Relative %: 61 %
Platelet Count: 203 10*3/uL (ref 145–400)
RBC: 4.39 MIL/uL (ref 3.70–5.45)
RDW: 15.6 % — AB (ref 11.2–14.5)
WBC Count: 8.5 10*3/uL (ref 3.9–10.3)

## 2018-02-20 LAB — CMP (CANCER CENTER ONLY)
ALBUMIN: 3.6 g/dL (ref 3.5–5.0)
ALT: 18 U/L (ref 0–44)
ANION GAP: 6 (ref 5–15)
AST: 20 U/L (ref 15–41)
Alkaline Phosphatase: 156 U/L — ABNORMAL HIGH (ref 38–126)
BUN: 20 mg/dL (ref 8–23)
CHLORIDE: 103 mmol/L (ref 98–111)
CO2: 29 mmol/L (ref 22–32)
Calcium: 9.4 mg/dL (ref 8.9–10.3)
Creatinine: 0.81 mg/dL (ref 0.44–1.00)
GFR, Est AFR Am: >60 mL/min (ref >60)
GFR, Estimated: >60 mL/min (ref >60)
GLUCOSE: 76 mg/dL (ref 70–99)
POTASSIUM: 4.1 mmol/L (ref 3.5–5.1)
Sodium: 138 mmol/L (ref 135–145)
Total Bilirubin: 0.4 mg/dL (ref 0.3–1.2)
Total Protein: 7.3 g/dL (ref 6.5–8.1)

## 2018-02-20 LAB — TSH: TSH: 32.341 u[IU]/mL — AB (ref 0.308–3.960)

## 2018-02-20 LAB — IRON AND TIBC
Iron: 79 ug/dL (ref 41–142)
SATURATION RATIOS: 29 % (ref 21–57)
TIBC: 276 ug/dL (ref 236–444)
UIBC: 197 ug/dL

## 2018-02-20 LAB — FERRITIN: FERRITIN: 429 ng/mL — AB (ref 11–307)

## 2018-02-20 MED ORDER — IOHEXOL 300 MG/ML  SOLN
100.0000 mL | Freq: Once | INTRAMUSCULAR | Status: AC | PRN
  Administered 2018-02-20: 100 mL via INTRAVENOUS

## 2018-02-20 MED ORDER — HEPARIN SOD (PORK) LOCK FLUSH 100 UNIT/ML IV SOLN
INTRAVENOUS | Status: AC
  Filled 2018-02-20: qty 5

## 2018-02-20 MED ORDER — HEPARIN SOD (PORK) LOCK FLUSH 100 UNIT/ML IV SOLN
500.0000 [IU] | Freq: Once | INTRAVENOUS | Status: AC
Start: 2018-02-20 — End: 2018-02-20
  Administered 2018-02-20: 500 [IU] via INTRAVENOUS

## 2018-02-20 MED ORDER — SODIUM CHLORIDE 0.9% FLUSH
10.0000 mL | Freq: Once | INTRAVENOUS | Status: AC
  Administered 2018-02-20: 10 mL
  Filled 2018-02-20: qty 10

## 2018-02-23 ENCOUNTER — Telehealth: Payer: Self-pay | Admitting: Hematology and Oncology

## 2018-02-23 ENCOUNTER — Inpatient Hospital Stay: Payer: Medicare Other

## 2018-02-23 ENCOUNTER — Inpatient Hospital Stay (HOSPITAL_BASED_OUTPATIENT_CLINIC_OR_DEPARTMENT_OTHER): Payer: Medicare Other | Admitting: Hematology and Oncology

## 2018-02-23 DIAGNOSIS — C541 Malignant neoplasm of endometrium: Secondary | ICD-10-CM

## 2018-02-23 DIAGNOSIS — C787 Secondary malignant neoplasm of liver and intrahepatic bile duct: Secondary | ICD-10-CM | POA: Diagnosis not present

## 2018-02-23 DIAGNOSIS — Z5112 Encounter for antineoplastic immunotherapy: Secondary | ICD-10-CM | POA: Diagnosis not present

## 2018-02-23 DIAGNOSIS — G893 Neoplasm related pain (acute) (chronic): Secondary | ICD-10-CM

## 2018-02-23 DIAGNOSIS — G2581 Restless legs syndrome: Secondary | ICD-10-CM

## 2018-02-23 DIAGNOSIS — E039 Hypothyroidism, unspecified: Secondary | ICD-10-CM

## 2018-02-23 DIAGNOSIS — R64 Cachexia: Secondary | ICD-10-CM

## 2018-02-23 DIAGNOSIS — K5909 Other constipation: Secondary | ICD-10-CM

## 2018-02-23 MED ORDER — LEVOTHYROXINE SODIUM 112 MCG PO TABS: 112.0000 ug | ORAL_TABLET | Freq: Every day | ORAL | 9 refills | Status: DC

## 2018-02-23 MED ORDER — SODIUM CHLORIDE 0.9 % IV SOLN
Freq: Once | INTRAVENOUS | Status: AC
  Administered 2018-02-23: 13:00:00 via INTRAVENOUS
  Filled 2018-02-23: qty 250

## 2018-02-23 MED ORDER — SODIUM CHLORIDE 0.9% FLUSH
10.0000 mL | INTRAVENOUS | Status: DC | PRN
  Administered 2018-02-23: 10 mL
  Filled 2018-02-23: qty 10

## 2018-02-23 MED ORDER — DRONABINOL 2.5 MG PO CAPS: 2.5000 mg | ORAL_CAPSULE | Freq: Two times a day (BID) | ORAL | 0 refills | Status: DC

## 2018-02-23 MED ORDER — MORPHINE SULFATE ER 15 MG PO TBCR: 15.0000 mg | EXTENDED_RELEASE_TABLET | Freq: Two times a day (BID) | ORAL | 0 refills | Status: DC

## 2018-02-23 MED ORDER — SODIUM CHLORIDE 0.9 % IV SOLN
200.0000 mg | Freq: Once | INTRAVENOUS | Status: AC
  Administered 2018-02-23: 200 mg via INTRAVENOUS
  Filled 2018-02-23: qty 8

## 2018-02-23 MED ORDER — HEPARIN SOD (PORK) LOCK FLUSH 100 UNIT/ML IV SOLN
500.0000 [IU] | Freq: Once | INTRAVENOUS | Status: AC | PRN
  Administered 2018-02-23: 500 [IU]
  Filled 2018-02-23: qty 5

## 2018-02-23 NOTE — Patient Instructions (Signed)
[facility] Discharge Instructions for Patients Receiving Chemotherapy  Today you received the following chemotherapy agents:  Keytruda (pembrolizumab)  To help prevent nausea and vomiting after your treatment, we encourage you to take your nausea medication as prescribed.   If you develop nausea and vomiting that is not controlled by your nausea medication, call the clinic.   BELOW ARE SYMPTOMS THAT SHOULD BE REPORTED IMMEDIATELY:  *FEVER GREATER THAN 100.5 F  *CHILLS WITH OR WITHOUT FEVER  NAUSEA AND VOMITING THAT IS NOT CONTROLLED WITH YOUR NAUSEA MEDICATION  *UNUSUAL SHORTNESS OF BREATH  *UNUSUAL BRUISING OR BLEEDING  TENDERNESS IN MOUTH AND THROAT WITH OR WITHOUT PRESENCE OF ULCERS  *URINARY PROBLEMS  *BOWEL PROBLEMS  UNUSUAL RASH Items with * indicate a potential emergency and should be followed up as soon as possible.  Feel free to call the clinic should you have any questions or concerns. The clinic phone number is [phone].  Please show the CHEMO ALERT CARD at check-in to the Emergency Department and triage nurse.   

## 2018-02-23 NOTE — Telephone Encounter (Signed)
Gave patient avs and calendar.   °

## 2018-02-25 ENCOUNTER — Encounter: Payer: Self-pay | Admitting: Hematology and Oncology

## 2018-02-25 NOTE — Assessment & Plan Note (Signed)
She has chronic constipation, improved on laxative therapy

## 2018-02-25 NOTE — Assessment & Plan Note (Signed)
She has gained some weight since the last time I saw her We will try to get her off appetite stimulant

## 2018-02-25 NOTE — Assessment & Plan Note (Signed)
Her cancer associated pain is improved, likely reflecting positive response to therapy I plan to reduce the dose of MS Contin and will continue to wean her off pain medicine if possible

## 2018-02-25 NOTE — Assessment & Plan Note (Signed)
Her TSH is abnormal, likely related to side effects of immunotherapy I plan to increase the dose of her thyroid medicine and rechecked again in her next visit

## 2018-02-25 NOTE — Assessment & Plan Note (Signed)
Overall, this is improving.  We will continue to monitor her liver function tests carefully

## 2018-02-25 NOTE — Progress Notes (Signed)
[place] OFFICE PROGRESS NOTE  Patient Care Team: [name], MD as PCP - General (Family Medicine)  ASSESSMENT & PLAN:  Recurrent carcinoma of endometrium [facility]) I have reviewed multiple imaging studies with the patient's and her sister The patient has positive response to treatment Clinically, she is improving with less pain and better quality of life We will continue treatment indefinitely I plan to repeat imaging again in 3 months  Acquired hypothyroidism Her TSH is abnormal, likely related to side effects of immunotherapy I plan to increase the dose of her thyroid medicine and rechecked again in her next visit  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  We will continue to monitor her liver function tests carefully  Restless leg syndrome This has improved.  It was likely related to iron deficiency anemia which has since been corrected with intravenous iron infusion. I will plan to check iron studies periodically   No orders of the defined types were placed in this encounter.   INTERVAL HISTORY: Please see below for problem oriented charting. She returns with her sister for treatment and follow-up on recent imaging studies She felt great since last time I saw her Her cancer pain has improved She denies further restless leg syndrome She denies nausea She is gaining weight She continues to have periodic chronic constipation but resolved with regular laxative therapy She has good quality of life and felt the best she has  been.  She tolerated treatment very well with no perceived side effects She denies recent fever or chills No recent infection or cough.  SUMMARY OF ONCOLOGIC HISTORY: Oncology History   MSI stable on June 2017 tissue but high from [facility] study from November 2017  05/15/16: ER is moderately positive (70%). PR is strongly positive (80%).     Recurrent carcinoma of endometrium ([place])   01/02/2016 Pathology Results    Uterus +/- tubes/ovaries, neoplastic, cervix ENDOMETRIAL ADENOCARCINOMA, FIGO GRADE 2 (4.7 CM) THE TUMOR INVADES LESS THAN ONE-HALF OF THE MYOMETRIUM (PT1A) ALL MARGINS OF RESECTION ARE NEGATIVE FOR CARCINOMA LEIOMYOMAS AND ADENOMYOSIS BILATERAL FALLOPIAN TUBES AND OVARIES: HISTOLOGICAL UNREMARKABLE 2. Lymph node, sentinel, biopsy, right obturator ONE BENIGN LYMPH NODE (0/1) 3. Lymph nodes, regional resection, left pelvic FOUR BENIGN LYMPH NODES (0/4)    01/02/2016 Surgery    Dr. Denman George performed robotic-assisted laparoscopic total hysterectomy with bilateral salpingoophorectomy, sentinel lymph node biopsy, lymphadenectomy      05/15/2016 Pathology Results    Vagina, biopsy, mid - ADENOCARCINOMA, SEE COMMENT. Microscopic Comment The morphology along with the patient's history are consistent with recurrent endometrioid adenocarcinoma. The carcinoma has a similar appearance to the primary ([id number]).    05/20/2016 Imaging    Ct scan abdomen showed solid 2.5 cm peritoneal mass in the mid to anterior left pelvis, suspicious for peritoneal metastasis. 2. Small expansile low-attenuation filling defect in the left external iliac vein, cannot exclude a small deep venous thrombus. Consider correlation with left lower extremity venous Doppler scan. 3. Small simple fluid density structure in the left pelvic sidewall abutting the left external iliac  vessels, favor a small postoperative seroma. 4. No ascites. 5. No lymphadenopathy.  No metastatic disease in the chest. 6. Aortic  atherosclerosis.    06/12/2016 PET scan    Intensely hypermetabolic 2.1 cm central pelvic peritoneal mass just to the left of midline, consistent with peritoneal metastatic recurrence. No ascites. 2. No additional hypermetabolic sites of metastatic disease. 3. Diffuse thyroid hypermetabolism without discrete thyroid nodule, favoring thyroiditis. Recommend correlation with serum thyroid function tests.    06/24/2016 - 07/22/2016 Chemotherapy    The patient had weekly cisplatin. She has missed several doses due to infection and pancytopenia    06/24/2016 - 09/04/2016 Radiation Therapy    She completed concurrent radiation therapy Radiation treatment dates:   IMRT : 06/24/16 - 08/01/16 HDR : 08/13/16, 08/20/16, 08/27/16, 09/04/16  Site/dose:   Pelvis treated to 55 Gy in 25 fractions (simultaneous integrated boost technique) Vaginal Cuff treated to 24 Gy in 4 fractions    08/15/2016 - 12/03/2016 Chemotherapy    She received 6 cycles of carboplatin/Taxol    09/27/2016 Imaging    Interval decrease in size of previously described solid peritoneal nodule within the left anterior pelvis. Near complete resolution of previously described low-attenuation structure along the left pelvic sidewall. No evidence for metastatic disease in the chest. Aortic atherosclerosis.    12/30/2016 Imaging    Ct abdomen 1. Solitary left pelvic peritoneal implant is mildly decreased in size in the interval. 2. No new or progressive metastatic disease in the abdomen or pelvis. No ascites. 3. Aortic atherosclerosis.    04/02/2017 Imaging    Left lower quadrant peritoneal implant referenced on previous exam measures 1.7 x 1.3 cm, image 69 of series 2. Increased from 0.8 x 0.8 cm previously peer no new peritoneal implants identified.  Musculoskeletal: The degenerative disc disease noted within the lumbar spine.  IMPRESSION: 1. Solitary left pelvic peritoneal implant is increased in size in the interval. 2. No new sites of  disease.  No ascites. 3. Aortic atherosclerosis    04/11/2017 PET scan    1. The left side of pelvis peritoneal implant has decreased in size and degree of FDG uptake compatible with response to therapy. No new areas of peritoneal disease identified. 2. Persistent diffuse increased uptake within the thyroid gland. Correlation with patient's thyroid function may be helpful.    05/26/2017 Imaging    1. Enlarging tumor implant along the left adnexa, currently 2.6 by 2.4 cm and previously 1.9 by 1.3 cm. No new tumor implant or other specific cause for the patient's pelvic symptoms is currently identified. 2.  Aortic Atherosclerosis (ICD10-I70.0). 3. Lumbar spondylosis and degenerative disc disease causing multilevel impingement.    06/04/2017 - 08/28/2017 Chemotherapy    The patient started letrozole and everolimus    08/26/2017 Imaging    Increased size of mass in the left adnexal region.  Several new small liver metastases in right hepatic lobe    09/08/2017 Imaging    LV EF: 60% -  65%    09/10/2017 Procedure    Technically successful right IJ power-injectable port catheter placement. Ready for routine use    12/04/2017 Imaging    1. Interval increase in size and number of multiple lesions within the liver compatible with hepatic metastatic disease. 2. Interval increase in size mass within the left hemipelvis.    12/15/2017 -  Chemotherapy    The patient had pembrolizumab (KEYTRUDA) 200 mg in sodium chloride 0.9 % 50 mL chemo infusion, 200 mg, Intravenous, Once, 1 of  6 cycles Administration: 200 mg (12/15/2017)  for chemotherapy treatment.     12/21/2017 Imaging    1. Moderate left hydronephrosis and hydroureter, similar compared to most recent CT from May 2019; obstruction appears to be secondary to a left pelvic mass/metastatic focus which has increased in size since the prior CT. 2. Numerous hepatic metastatic lesions, suspect that some may be increased in size but difficult to further  characterize without intravenous contrast. Increased size of left pelvic soft tissue mass/metastatic lesion. Mass abuts and possibly invades the sigmoid colon.     02/20/2018 Imaging    Interval decrease in hepatic metastases.  Decreased mass or lymphadenopathy in the left external iliac chain. Interval resolution of left hydroureteronephrosis.  No new or progressive disease within the abdomen or pelvis.     REVIEW OF SYSTEMS:   Constitutional: Denies fevers, chills or abnormal weight loss Eyes: Denies blurriness of vision Ears, nose, mouth, throat, and face: Denies mucositis or sore throat Respiratory: Denies cough, dyspnea or wheezes Cardiovascular: Denies palpitation, chest discomfort or lower extremity swelling Skin: Denies abnormal skin rashes Lymphatics: Denies new lymphadenopathy or easy bruising Neurological:Denies numbness, tingling or new weaknesses Behavioral/Psych: Mood is stable, no new changes  All other systems were reviewed with the patient and are negative.  I have reviewed the past medical history, past surgical history, social history and family history with the patient and they are unchanged from previous note.  ALLERGIES:  is allergic to latex.  MEDICATIONS:  Current Outpatient Medications  Medication Sig Dispense Refill  . acetaminophen (TYLENOL) 325 MG tablet Take 650 mg by mouth every 6 (six) hours as needed for mild pain or moderate pain.     Marland Kitchen amoxicillin (AMOXIL) 500 MG capsule Take 2,000 mg by mouth See admin instructions. Prior to dental appointment, takes 4 tables prior to appt    . Biotin w/ Vitamins C & E (HAIR/SKIN/NAILS PO) Take 1 tablet by mouth daily.    . Calcium Carb-Cholecalciferol (CALCIUM 600 + D PO) Take 2 tablets by mouth daily.    . celecoxib (CELEBREX) 200 MG capsule Take 1 capsule twice a day with food as needed for chest wall pain.    Marland Kitchen dronabinol (MARINOL) 2.5 MG capsule Take 1 capsule (2.5 mg total) by mouth 2 (two) times daily  before lunch and supper. 60 capsule 0  . fluticasone (FLONASE) 50 MCG/ACT nasal spray Place 1 spray into both nostrils 2 (two) times daily as needed for allergies.   1  . gabapentin (NEURONTIN) 600 MG tablet TAKE 1 TABLET BY MOUTH TWICE A DAY (Patient taking differently: Take 1 tablet 2 times a day) 60 tablet 9  . HYDROmorphone (DILAUDID) 4 MG tablet Take 1 tablet (4 mg total) by mouth every 4 (four) hours as needed for severe pain. 60 tablet 0  . ibuprofen (ADVIL,MOTRIN) 200 MG tablet Take 400 mg by mouth every 8 (eight) hours as needed for mild pain.    Marland Kitchen levothyroxine (SYNTHROID, LEVOTHROID) 112 MCG tablet Take 1 tablet (112 mcg total) by mouth daily. 30 tablet 9  . loratadine (CLARITIN) 10 MG tablet Take 10 mg by mouth daily.    Marland Kitchen LORazepam (ATIVAN) 1 MG tablet Take 1 tablet (1 mg total) by mouth every 8 (eight) hours as needed for anxiety or sleep. 90 tablet 0  . Magnesium 250 MG TABS Take 250 mg by mouth daily.    . Melatonin 10 MG CAPS Take 10 mg by mouth at bedtime.    Marland Kitchen morphine (  MS CONTIN) 15 MG 12 hr tablet Take 1 tablet (15 mg total) by mouth every 12 (twelve) hours. 60 tablet 0  . ondansetron (ZOFRAN) 8 MG tablet Take 1 tablet (8 mg total) by mouth every 8 (eight) hours as needed for nausea. 60 tablet 1  . traZODone (DESYREL) 50 MG tablet Take 1.5 tablets (75 mg total) by mouth at bedtime. (Patient taking differently: Take 50 mg by mouth at bedtime. ) 135 tablet 1   No current facility-administered medications for this visit.     PHYSICAL EXAMINATION: ECOG PERFORMANCE STATUS: 1 - Symptomatic but completely ambulatory  Vitals:   02/23/18 1129  BP: (!) 171/89  Pulse: 68  Resp: 18  Temp: 98.4 F (36.9 C)  SpO2: 99%   Filed Weights   02/23/18 1129  Weight: 188 lb 1.6 oz (85.3 kg)    GENERAL:alert, no distress and comfortable SKIN: skin color, texture, turgor are normal, no rashes or significant lesions EYES: normal, Conjunctiva are pink and non-injected, sclera  clear OROPHARYNX:no exudate, no erythema and lips, buccal mucosa, and tongue normal  NECK: supple, thyroid normal size, non-tender, without nodularity LYMPH:  no palpable lymphadenopathy in the cervical, axillary or inguinal LUNGS: clear to auscultation and percussion with normal breathing effort HEART: regular rate & rhythm and no murmurs and no lower extremity edema ABDOMEN:abdomen soft, non-tender and normal bowel sounds Musculoskeletal:no cyanosis of digits and no clubbing  NEURO: alert & oriented x 3 with fluent speech, no focal motor/sensory deficits  LABORATORY DATA:  I have reviewed the data as listed    Component Value Date/Time   NA 138 02/20/2018 1148   NA 141 07/09/2017 1312   K 4.1 02/20/2018 1148   K 3.9 07/09/2017 1312   CL 103 02/20/2018 1148   CO2 29 02/20/2018 1148   CO2 27 07/09/2017 1312   GLUCOSE 76 02/20/2018 1148   GLUCOSE 109 07/09/2017 1312   BUN 20 02/20/2018 1148   BUN 16.4 07/09/2017 1312   CREATININE 0.81 02/20/2018 1148   CREATININE 0.8 07/09/2017 1312   CALCIUM 9.4 02/20/2018 1148   CALCIUM 9.2 07/09/2017 1312   PROT 7.3 02/20/2018 1148   PROT 7.1 07/09/2017 1312   ALBUMIN 3.6 02/20/2018 1148   ALBUMIN 3.1 (L) 07/09/2017 1312   AST 20 02/20/2018 1148   AST 35 (H) 07/09/2017 1312   ALT 18 02/20/2018 1148   ALT 43 07/09/2017 1312   ALKPHOS 156 (H) 02/20/2018 1148   ALKPHOS 182 (H) 07/09/2017 1312   BILITOT 0.4 02/20/2018 1148   BILITOT 0.34 07/09/2017 1312   GFRNONAA >60 02/20/2018 1148   GFRAA >60 02/20/2018 1148    No results found for: SPEP, UPEP  Lab Results  Component Value Date   WBC 8.5 02/20/2018   NEUTROABS 5.2 02/20/2018   HGB 12.2 02/20/2018   HCT 37.1 02/20/2018   MCV 84.5 02/20/2018   PLT 203 02/20/2018      Chemistry      Component Value Date/Time   NA 138 02/20/2018 1148   NA 141 07/09/2017 1312   K 4.1 02/20/2018 1148   K 3.9 07/09/2017 1312   CL 103 02/20/2018 1148   CO2 29 02/20/2018 1148   CO2 27  07/09/2017 1312   BUN 20 02/20/2018 1148   BUN 16.4 07/09/2017 1312   CREATININE 0.81 02/20/2018 1148   CREATININE 0.8 07/09/2017 1312      Component Value Date/Time   CALCIUM 9.4 02/20/2018 1148   CALCIUM 9.2 07/09/2017 1312  ALKPHOS 156 (H) 02/20/2018 1148   ALKPHOS 182 (H) 07/09/2017 1312   AST 20 02/20/2018 1148   AST 35 (H) 07/09/2017 1312   ALT 18 02/20/2018 1148   ALT 43 07/09/2017 1312   BILITOT 0.4 02/20/2018 1148   BILITOT 0.34 07/09/2017 1312       RADIOGRAPHIC STUDIES: I have reviewed multiple imaging studies with her and her sister I have personally reviewed the radiological images as listed and agreed with the findings in the report. Ct Abdomen Pelvis W Contrast  Result Date: 02/20/2018 CLINICAL DATA:  Followup metastatic endometrial carcinoma. Ongoing immunotherapy. EXAM: CT ABDOMEN AND PELVIS WITH CONTRAST TECHNIQUE: Multidetector CT imaging of the abdomen and pelvis was performed using the standard protocol following bolus administration of intravenous contrast. CONTRAST:  158m OMNIPAQUE IOHEXOL 300 MG/ML  SOLN COMPARISON:  12/03/2017 FINDINGS: Lower Chest: No acute findings. Hepatobiliary: There has been interval decrease in size of hepatic metastases since previous study. Index lesion in the posterior right hepatic lobe measures 2.2 x 1.9 cm on image 28/2, compared to 3.4 x 2.7 cm previously. No new or enlarging liver masses are identified. Gallbladder is unremarkable. No evidence of biliary ductal dilatation. Pancreas:  No mass or inflammatory changes. Spleen: Within normal limits in size and appearance. Adrenals/Urinary Tract: No masses identified. No evidence of hydronephrosis. Stomach/Bowel: No evidence of obstruction, inflammatory process or abnormal fluid collections. Vascular/Lymphatic: Necrotic mass or lymphadenopathy along the left external iliac region has decreased in size since previous study, currently measuring 4.4 x 3.5 cm on image 63/2, compared to 5.2 x  3.8 cm previously. No new or increased lymphadenopathy identified. Reproductive: Prior hysterectomy noted. No mass seen within hysterectomy bed. Other:  None. Musculoskeletal:  No suspicious bone lesions identified. IMPRESSION: Interval decrease in hepatic metastases. Decreased mass or lymphadenopathy in the left external iliac chain. Interval resolution of left hydroureteronephrosis. No new or progressive disease within the abdomen or pelvis. Electronically Signed   By: JEarle GellM.D.   On: 02/20/2018 14:53    All questions were answered. The patient knows to call the clinic with any problems, questions or concerns. No barriers to learning was detected.  I spent 30 minutes counseling the patient face to face. The total time spent in the appointment was 40 minutes and more than 50% was on counseling and review of test results  NHeath Lark MD 02/25/2018 6:47 AM

## 2018-02-25 NOTE — Assessment & Plan Note (Signed)
This has improved.  It was likely related to iron deficiency anemia which has since been corrected with intravenous iron infusion. I will plan to check iron studies periodically

## 2018-02-25 NOTE — Assessment & Plan Note (Signed)
I have reviewed multiple imaging studies with the patient's and her sister The patient has positive response to treatment Clinically, she is improving with less pain and better quality of life We will continue treatment indefinitely I plan to repeat imaging again in 3 months

## 2018-03-06 ENCOUNTER — Telehealth: Payer: Self-pay | Admitting: *Deleted

## 2018-03-06 ENCOUNTER — Inpatient Hospital Stay (HOSPITAL_BASED_OUTPATIENT_CLINIC_OR_DEPARTMENT_OTHER): Payer: Medicare Other | Admitting: Hematology and Oncology

## 2018-03-06 ENCOUNTER — Inpatient Hospital Stay: Payer: Medicare Other

## 2018-03-06 DIAGNOSIS — E039 Hypothyroidism, unspecified: Secondary | ICD-10-CM | POA: Diagnosis not present

## 2018-03-06 DIAGNOSIS — T451X5A Adverse effect of antineoplastic and immunosuppressive drugs, initial encounter: Secondary | ICD-10-CM

## 2018-03-06 DIAGNOSIS — N939 Abnormal uterine and vaginal bleeding, unspecified: Secondary | ICD-10-CM

## 2018-03-06 DIAGNOSIS — D6481 Anemia due to antineoplastic chemotherapy: Secondary | ICD-10-CM

## 2018-03-06 DIAGNOSIS — C541 Malignant neoplasm of endometrium: Secondary | ICD-10-CM

## 2018-03-06 DIAGNOSIS — Z79899 Other long term (current) drug therapy: Secondary | ICD-10-CM

## 2018-03-06 DIAGNOSIS — Z5112 Encounter for antineoplastic immunotherapy: Secondary | ICD-10-CM | POA: Diagnosis not present

## 2018-03-06 DIAGNOSIS — C787 Secondary malignant neoplasm of liver and intrahepatic bile duct: Secondary | ICD-10-CM | POA: Diagnosis not present

## 2018-03-06 LAB — TSH: TSH: 32.709 u[IU]/mL — ABNORMAL HIGH (ref 0.308–3.960)

## 2018-03-06 LAB — CMP (CANCER CENTER ONLY)
ALT: 23 U/L (ref 0–44)
ANION GAP: 8 (ref 5–15)
AST: 28 U/L (ref 15–41)
Albumin: 3.6 g/dL (ref 3.5–5.0)
Alkaline Phosphatase: 161 U/L — ABNORMAL HIGH (ref 38–126)
BILIRUBIN TOTAL: 0.4 mg/dL (ref 0.3–1.2)
BUN: 23 mg/dL (ref 8–23)
CO2: 30 mmol/L (ref 22–32)
Calcium: 9.7 mg/dL (ref 8.9–10.3)
Chloride: 105 mmol/L (ref 98–111)
Creatinine: 1 mg/dL (ref 0.44–1.00)
GFR, EST NON AFRICAN AMERICAN: 56 mL/min — AB (ref >60)
Glucose, Bld: 86 mg/dL (ref 70–99)
Potassium: 4.4 mmol/L (ref 3.5–5.1)
Sodium: 143 mmol/L (ref 135–145)
TOTAL PROTEIN: 7.3 g/dL (ref 6.5–8.1)

## 2018-03-06 LAB — CBC WITH DIFFERENTIAL (CANCER CENTER ONLY)
BASOS ABS: 0.1 10*3/uL (ref 0.0–0.1)
Basophils Relative: 1 %
EOS PCT: 24 %
Eosinophils Absolute: 1.8 10*3/uL — ABNORMAL HIGH (ref 0.0–0.5)
HCT: 36.7 % (ref 34.8–46.6)
HEMOGLOBIN: 12.2 g/dL (ref 11.6–15.9)
Lymphocytes Relative: 14 %
Lymphs Abs: 1.1 10*3/uL (ref 0.9–3.3)
MCH: 27.8 pg (ref 25.1–34.0)
MCHC: 33.2 g/dL (ref 31.5–36.0)
MCV: 83.7 fL (ref 79.5–101.0)
Monocytes Absolute: 0.4 10*3/uL (ref 0.1–0.9)
Monocytes Relative: 6 %
NEUTROS PCT: 55 %
Neutro Abs: 4.3 10*3/uL (ref 1.5–6.5)
PLATELETS: 195 10*3/uL (ref 145–400)
RBC: 4.38 MIL/uL (ref 3.70–5.45)
RDW: 16.4 % — ABNORMAL HIGH (ref 11.2–14.5)
WBC: 7.7 10*3/uL (ref 3.9–10.3)

## 2018-03-06 LAB — FERRITIN: Ferritin: 326 ng/mL — ABNORMAL HIGH (ref 11–307)

## 2018-03-06 LAB — IRON AND TIBC
IRON: 94 ug/dL (ref 41–142)
SATURATION RATIOS: 36 % (ref 21–57)
TIBC: 260 ug/dL (ref 236–444)
UIBC: 166 ug/dL

## 2018-03-06 NOTE — Telephone Encounter (Signed)
Pt states she is having a lot of bleeding this morning.   Will come in to see Dr Alvy Bimler today

## 2018-03-07 ENCOUNTER — Encounter: Payer: Self-pay | Admitting: Hematology and Oncology

## 2018-03-07 DIAGNOSIS — N939 Abnormal uterine and vaginal bleeding, unspecified: Secondary | ICD-10-CM | POA: Insufficient documentation

## 2018-03-07 NOTE — Progress Notes (Signed)
[facility] OFFICE PROGRESS NOTE  Patient Care Team: [name], MD as PCP - General (Family Medicine)  ASSESSMENT & PLAN:  Recurrent carcinoma of endometrium [facility]) She tolerated recent treatment very well Recent CT imaging show excellent response to therapy We will continue treatment as scheduled  Vaginal bleeding, abnormal I performed a brief vaginal exam and external rectal evaluation I do not see any local lesions that could account for her vaginal bleeding. Her iron studies are adequate and she is not anemic I suspect she had necrotic tumor from the pelvic lesion seen on CT imaging that is currently being discharged I reassured the patient.  If she has recurrent bleeding again, she will call me.  Otherwise I plan to see her in 10 days as scheduled for her next treatment  Acquired hypothyroidism She is responding well to thyroid replacement therapy although TSH is still high I recommend she increase her thyroid supplement to 150 MCG daily and plan to recheck it again when she returns   No orders of the defined types were placed in this encounter.   INTERVAL HISTORY: Please see below for problem oriented charting. She is seen urgently due to abnormal vaginal bleeding.  She woke up feeling well When she tried to urinate, she had significant vaginal discharge with heavy vaginal bleeding.  That has subsequently resolved spontaneously. Later of the day, she had a bowel movement and after straining, she seen abnormal vaginal bleeding again. She denies persistent bleeding.  She did not have to wear a pad Denies other forms of bleeding such as epistaxis, hematuria or hematochezia  SUMMARY OF ONCOLOGIC HISTORY: Oncology History   MSI stable on June 2017 tissue but high from [facility] study from November 2017  05/15/16: ER is moderately positive (70%). PR is strongly positive (80%).     Recurrent carcinoma of endometrium ([place])   01/02/2016 Pathology Results     Uterus +/- tubes/ovaries, neoplastic, cervix ENDOMETRIAL ADENOCARCINOMA, FIGO GRADE 2 (4.7 CM) THE TUMOR INVADES LESS THAN ONE-HALF OF THE MYOMETRIUM (PT1A) ALL MARGINS OF RESECTION ARE NEGATIVE FOR CARCINOMA LEIOMYOMAS AND ADENOMYOSIS BILATERAL FALLOPIAN TUBES AND OVARIES: HISTOLOGICAL UNREMARKABLE 2. Lymph node, sentinel, biopsy, right obturator ONE BENIGN LYMPH NODE (0/1) 3. Lymph nodes, regional resection, left pelvic FOUR BENIGN LYMPH NODES (0/4)    01/02/2016 Surgery    Dr. Denman George performed robotic-assisted laparoscopic total hysterectomy with bilateral salpingoophorectomy, sentinel lymph node biopsy, lymphadenectomy      05/15/2016 Pathology Results    Vagina, biopsy, mid - ADENOCARCINOMA, SEE COMMENT. Microscopic Comment The morphology along with the patient's history are consistent with recurrent endometrioid adenocarcinoma. The carcinoma has a similar appearance to the primary ([id number]).    05/20/2016 Imaging    Ct scan abdomen showed solid 2.5 cm peritoneal mass in the mid to anterior left pelvis, suspicious for peritoneal metastasis. 2. Small expansile low-attenuation filling defect in the left external iliac vein, cannot exclude a small deep venous thrombus. Consider correlation with left lower extremity venous Doppler scan. 3. Small simple fluid density structure in the left pelvic sidewall abutting the left external iliac vessels, favor a small postoperative seroma. 4. No ascites. 5. No lymphadenopathy.  No metastatic disease in the chest. 6. Aortic atherosclerosis.    06/12/2016 PET scan    Intensely hypermetabolic 2.1 cm central pelvic peritoneal mass just to the left of midline, consistent with peritoneal metastatic recurrence. No ascites. 2. No additional hypermetabolic sites of metastatic disease. 3. Diffuse thyroid hypermetabolism without discrete thyroid nodule,  favoring thyroiditis. Recommend correlation with serum thyroid function tests.    06/24/2016 -  07/22/2016 Chemotherapy    The patient had weekly cisplatin. She has missed several doses due to infection and pancytopenia    06/24/2016 - 09/04/2016 Radiation Therapy    She completed concurrent radiation therapy Radiation treatment dates:   IMRT : 06/24/16 - 08/01/16 HDR : 08/13/16, 08/20/16, 08/27/16, 09/04/16  Site/dose:   Pelvis treated to 55 Gy in 25 fractions (simultaneous integrated boost technique) Vaginal Cuff treated to 24 Gy in 4 fractions    08/15/2016 - 12/03/2016 Chemotherapy    She received 6 cycles of carboplatin/Taxol    09/27/2016 Imaging    Interval decrease in size of previously described solid peritoneal nodule within the left anterior pelvis. Near complete resolution of previously described low-attenuation structure along the left pelvic sidewall. No evidence for metastatic disease in the chest. Aortic atherosclerosis.    12/30/2016 Imaging    Ct abdomen 1. Solitary left pelvic peritoneal implant is mildly decreased in size in the interval. 2. No new or progressive metastatic disease in the abdomen or pelvis. No ascites. 3. Aortic atherosclerosis.    04/02/2017 Imaging    Left lower quadrant peritoneal implant referenced on previous exam measures 1.7 x 1.3 cm, image 69 of series 2. Increased from 0.8 x 0.8 cm previously peer no new peritoneal implants identified.  Musculoskeletal: The degenerative disc disease noted within the lumbar spine.  IMPRESSION: 1. Solitary left pelvic peritoneal implant is increased in size in the interval. 2. No new sites of disease.  No ascites. 3. Aortic atherosclerosis    04/11/2017 PET scan    1. The left side of pelvis peritoneal implant has decreased in size and degree of FDG uptake compatible with response to therapy. No new areas of peritoneal disease identified. 2. Persistent diffuse increased uptake within the thyroid gland. Correlation with patient's thyroid function may be helpful.    05/26/2017 Imaging    1. Enlarging tumor  implant along the left adnexa, currently 2.6 by 2.4 cm and previously 1.9 by 1.3 cm. No new tumor implant or other specific cause for the patient's pelvic symptoms is currently identified. 2.  Aortic Atherosclerosis (ICD10-I70.0). 3. Lumbar spondylosis and degenerative disc disease causing multilevel impingement.    06/04/2017 - 08/28/2017 Chemotherapy    The patient started letrozole and everolimus    08/26/2017 Imaging    Increased size of mass in the left adnexal region.  Several new small liver metastases in right hepatic lobe    09/08/2017 Imaging    LV EF: 60% -  65%    09/10/2017 Procedure    Technically successful right IJ power-injectable port catheter placement. Ready for routine use    12/04/2017 Imaging    1. Interval increase in size and number of multiple lesions within the liver compatible with hepatic metastatic disease. 2. Interval increase in size mass within the left hemipelvis.    12/15/2017 -  Chemotherapy    The patient had pembrolizumab (KEYTRUDA) 200 mg in sodium chloride 0.9 % 50 mL chemo infusion, 200 mg, Intravenous, Once, 1 of 6 cycles Administration: 200 mg (12/15/2017)  for chemotherapy treatment.     12/21/2017 Imaging    1. Moderate left hydronephrosis and hydroureter, similar compared to most recent CT from May 2019; obstruction appears to be secondary to a left pelvic mass/metastatic focus which has increased in size since the prior CT. 2. Numerous hepatic metastatic lesions, suspect that some may be increased in size but  difficult to further characterize without intravenous contrast. Increased size of left pelvic soft tissue mass/metastatic lesion. Mass abuts and possibly invades the sigmoid colon.     02/20/2018 Imaging    Interval decrease in hepatic metastases.  Decreased mass or lymphadenopathy in the left external iliac chain. Interval resolution of left hydroureteronephrosis.  No new or progressive disease within the abdomen or pelvis.      REVIEW OF SYSTEMS:   Constitutional: Denies fevers, chills or abnormal weight loss Eyes: Denies blurriness of vision Ears, nose, mouth, throat, and face: Denies mucositis or sore throat Respiratory: Denies cough, dyspnea or wheezes Cardiovascular: Denies palpitation, chest discomfort or lower extremity swelling Gastrointestinal:  Denies nausea, heartburn or change in bowel habits Skin: Denies abnormal skin rashes Lymphatics: Denies new lymphadenopathy or easy bruising Neurological:Denies numbness, tingling or new weaknesses Behavioral/Psych: Mood is stable, no new changes  All other systems were reviewed with the patient and are negative.  I have reviewed the past medical history, past surgical history, social history and family history with the patient and they are unchanged from previous note.  ALLERGIES:  is allergic to latex.  MEDICATIONS:  Current Outpatient Medications  Medication Sig Dispense Refill  . acetaminophen (TYLENOL) 325 MG tablet Take 650 mg by mouth every 6 (six) hours as needed for mild pain or moderate pain.     Marland Kitchen amoxicillin (AMOXIL) 500 MG capsule Take 2,000 mg by mouth See admin instructions. Prior to dental appointment, takes 4 tables prior to appt    . Biotin w/ Vitamins C & E (HAIR/SKIN/NAILS PO) Take 1 tablet by mouth daily.    . Calcium Carb-Cholecalciferol (CALCIUM 600 + D PO) Take 2 tablets by mouth daily.    . celecoxib (CELEBREX) 200 MG capsule Take 1 capsule twice a day with food as needed for chest wall pain.    Marland Kitchen dronabinol (MARINOL) 2.5 MG capsule Take 1 capsule (2.5 mg total) by mouth 2 (two) times daily before lunch and supper. 60 capsule 0  . fluticasone (FLONASE) 50 MCG/ACT nasal spray Place 1 spray into both nostrils 2 (two) times daily as needed for allergies.   1  . gabapentin (NEURONTIN) 600 MG tablet TAKE 1 TABLET BY MOUTH TWICE A DAY (Patient taking differently: Take 1 tablet 2 times a day) 60 tablet 9  . HYDROmorphone (DILAUDID) 4 MG  tablet Take 1 tablet (4 mg total) by mouth every 4 (four) hours as needed for severe pain. 60 tablet 0  . ibuprofen (ADVIL,MOTRIN) 200 MG tablet Take 400 mg by mouth every 8 (eight) hours as needed for mild pain.    Marland Kitchen levothyroxine (SYNTHROID, LEVOTHROID) 112 MCG tablet Take 1 tablet (112 mcg total) by mouth daily. 30 tablet 9  . loratadine (CLARITIN) 10 MG tablet Take 10 mg by mouth daily.    Marland Kitchen LORazepam (ATIVAN) 1 MG tablet Take 1 tablet (1 mg total) by mouth every 8 (eight) hours as needed for anxiety or sleep. 90 tablet 0  . Magnesium 250 MG TABS Take 250 mg by mouth daily.    . Melatonin 10 MG CAPS Take 10 mg by mouth at bedtime.    Marland Kitchen morphine (MS CONTIN) 15 MG 12 hr tablet Take 1 tablet (15 mg total) by mouth every 12 (twelve) hours. 60 tablet 0  . ondansetron (ZOFRAN) 8 MG tablet Take 1 tablet (8 mg total) by mouth every 8 (eight) hours as needed for nausea. 60 tablet 1  . traZODone (DESYREL) 50 MG tablet Take 1.5 tablets (  75 mg total) by mouth at bedtime. (Patient taking differently: Take 50 mg by mouth at bedtime. ) 135 tablet 1   No current facility-administered medications for this visit.     PHYSICAL EXAMINATION: ECOG PERFORMANCE STATUS: 1 - Symptomatic but completely ambulatory  Vitals:   03/06/18 1247  BP: 140/77  Pulse: 70  Resp: 16  Temp: 97.8 F (36.6 C)  SpO2: 96%   Filed Weights   03/06/18 1247  Weight: 191 lb 1.6 oz (86.7 kg)    GENERAL:alert, no distress and comfortable SKIN: skin color, texture, turgor are normal, no rashes or significant lesions EYES: normal, Conjunctiva are pink and non-injected, sclera clear OROPHARYNX:no exudate, no erythema and lips, buccal mucosa, and tongue normal  NECK: supple, thyroid normal size, non-tender, without nodularity LYMPH:  no palpable lymphadenopathy in the cervical, axillary or inguinal LUNGS: clear to auscultation and percussion with normal breathing effort HEART: regular rate & rhythm and no murmurs and no lower  extremity edema ABDOMEN:abdomen soft, non-tender and normal bowel sounds External genitalia/vaginal introitus look normal.  She had minor skin tag on her rectal area but no evidence of active bleeding Musculoskeletal:no cyanosis of digits and no clubbing  NEURO: alert & oriented x 3 with fluent speech, no focal motor/sensory deficits  LABORATORY DATA:  I have reviewed the data as listed    Component Value Date/Time   NA 143 03/06/2018 1155   NA 141 07/09/2017 1312   K 4.4 03/06/2018 1155   K 3.9 07/09/2017 1312   CL 105 03/06/2018 1155   CO2 30 03/06/2018 1155   CO2 27 07/09/2017 1312   GLUCOSE 86 03/06/2018 1155   GLUCOSE 109 07/09/2017 1312   BUN 23 03/06/2018 1155   BUN 16.4 07/09/2017 1312   CREATININE 1.00 03/06/2018 1155   CREATININE 0.8 07/09/2017 1312   CALCIUM 9.7 03/06/2018 1155   CALCIUM 9.2 07/09/2017 1312   PROT 7.3 03/06/2018 1155   PROT 7.1 07/09/2017 1312   ALBUMIN 3.6 03/06/2018 1155   ALBUMIN 3.1 (L) 07/09/2017 1312   AST 28 03/06/2018 1155   AST 35 (H) 07/09/2017 1312   ALT 23 03/06/2018 1155   ALT 43 07/09/2017 1312   ALKPHOS 161 (H) 03/06/2018 1155   ALKPHOS 182 (H) 07/09/2017 1312   BILITOT 0.4 03/06/2018 1155   BILITOT 0.34 07/09/2017 1312   GFRNONAA 56 (L) 03/06/2018 1155   GFRAA >60 03/06/2018 1155    No results found for: SPEP, UPEP  Lab Results  Component Value Date   WBC 7.7 03/06/2018   NEUTROABS 4.3 03/06/2018   HGB 12.2 03/06/2018   HCT 36.7 03/06/2018   MCV 83.7 03/06/2018   PLT 195 03/06/2018      Chemistry      Component Value Date/Time   NA 143 03/06/2018 1155   NA 141 07/09/2017 1312   K 4.4 03/06/2018 1155   K 3.9 07/09/2017 1312   CL 105 03/06/2018 1155   CO2 30 03/06/2018 1155   CO2 27 07/09/2017 1312   BUN 23 03/06/2018 1155   BUN 16.4 07/09/2017 1312   CREATININE 1.00 03/06/2018 1155   CREATININE 0.8 07/09/2017 1312      Component Value Date/Time   CALCIUM 9.7 03/06/2018 1155   CALCIUM 9.2 07/09/2017 1312    ALKPHOS 161 (H) 03/06/2018 1155   ALKPHOS 182 (H) 07/09/2017 1312   AST 28 03/06/2018 1155   AST 35 (H) 07/09/2017 1312   ALT 23 03/06/2018 1155   ALT 43 07/09/2017 1312  BILITOT 0.4 03/06/2018 1155   BILITOT 0.34 07/09/2017 1312       RADIOGRAPHIC STUDIES: I have reviewed imaging study with the patient again  I have personally reviewed the radiological images as listed and agreed with the findings in the report. Ct Abdomen Pelvis W Contrast  Result Date: 02/20/2018 CLINICAL DATA:  Followup metastatic endometrial carcinoma. Ongoing immunotherapy. EXAM: CT ABDOMEN AND PELVIS WITH CONTRAST TECHNIQUE: Multidetector CT imaging of the abdomen and pelvis was performed using the standard protocol following bolus administration of intravenous contrast. CONTRAST:  178m OMNIPAQUE IOHEXOL 300 MG/ML  SOLN COMPARISON:  12/03/2017 FINDINGS: Lower Chest: No acute findings. Hepatobiliary: There has been interval decrease in size of hepatic metastases since previous study. Index lesion in the posterior right hepatic lobe measures 2.2 x 1.9 cm on image 28/2, compared to 3.4 x 2.7 cm previously. No new or enlarging liver masses are identified. Gallbladder is unremarkable. No evidence of biliary ductal dilatation. Pancreas:  No mass or inflammatory changes. Spleen: Within normal limits in size and appearance. Adrenals/Urinary Tract: No masses identified. No evidence of hydronephrosis. Stomach/Bowel: No evidence of obstruction, inflammatory process or abnormal fluid collections. Vascular/Lymphatic: Necrotic mass or lymphadenopathy along the left external iliac region has decreased in size since previous study, currently measuring 4.4 x 3.5 cm on image 63/2, compared to 5.2 x 3.8 cm previously. No new or increased lymphadenopathy identified. Reproductive: Prior hysterectomy noted. No mass seen within hysterectomy bed. Other:  None. Musculoskeletal:  No suspicious bone lesions identified. IMPRESSION: Interval decrease  in hepatic metastases. Decreased mass or lymphadenopathy in the left external iliac chain. Interval resolution of left hydroureteronephrosis. No new or progressive disease within the abdomen or pelvis. Electronically Signed   By: JEarle GellM.D.   On: 02/20/2018 14:53    All questions were answered. The patient knows to call the clinic with any problems, questions or concerns. No barriers to learning was detected.  I spent 15 minutes counseling the patient face to face. The total time spent in the appointment was 20 minutes and more than 50% was on counseling and review of test results  NHeath Lark MD 03/07/2018 6:12 AM

## 2018-03-07 NOTE — Assessment & Plan Note (Signed)
She is responding well to thyroid replacement therapy although TSH is still high I recommend she increase her thyroid supplement to 150 MCG daily and plan to recheck it again when she returns

## 2018-03-07 NOTE — Assessment & Plan Note (Signed)
I performed a brief vaginal exam and external rectal evaluation I do not see any local lesions that could account for her vaginal bleeding. Her iron studies are adequate and she is not anemic I suspect she had necrotic tumor from the pelvic lesion seen on CT imaging that is currently being discharged I reassured the patient.  If she has recurrent bleeding again, she will call me.  Otherwise I plan to see her in 10 days as scheduled for her next treatment

## 2018-03-07 NOTE — Assessment & Plan Note (Signed)
She tolerated recent treatment very well Recent CT imaging show excellent response to therapy We will continue treatment as scheduled

## 2018-03-16 ENCOUNTER — Inpatient Hospital Stay (HOSPITAL_BASED_OUTPATIENT_CLINIC_OR_DEPARTMENT_OTHER): Payer: Medicare Other | Admitting: Hematology and Oncology

## 2018-03-16 ENCOUNTER — Encounter: Payer: Self-pay | Admitting: Hematology and Oncology

## 2018-03-16 ENCOUNTER — Inpatient Hospital Stay: Payer: Medicare Other

## 2018-03-16 ENCOUNTER — Inpatient Hospital Stay: Payer: Medicare Other | Attending: Hematology and Oncology

## 2018-03-16 DIAGNOSIS — C541 Malignant neoplasm of endometrium: Secondary | ICD-10-CM

## 2018-03-16 DIAGNOSIS — E039 Hypothyroidism, unspecified: Secondary | ICD-10-CM | POA: Diagnosis not present

## 2018-03-16 DIAGNOSIS — N939 Abnormal uterine and vaginal bleeding, unspecified: Secondary | ICD-10-CM

## 2018-03-16 DIAGNOSIS — G893 Neoplasm related pain (acute) (chronic): Secondary | ICD-10-CM

## 2018-03-16 DIAGNOSIS — D6481 Anemia due to antineoplastic chemotherapy: Secondary | ICD-10-CM

## 2018-03-16 DIAGNOSIS — Z5112 Encounter for antineoplastic immunotherapy: Secondary | ICD-10-CM | POA: Insufficient documentation

## 2018-03-16 DIAGNOSIS — T451X5A Adverse effect of antineoplastic and immunosuppressive drugs, initial encounter: Secondary | ICD-10-CM

## 2018-03-16 DIAGNOSIS — I10 Essential (primary) hypertension: Secondary | ICD-10-CM

## 2018-03-16 LAB — CBC WITH DIFFERENTIAL (CANCER CENTER ONLY)
BASOS ABS: 0 10*3/uL (ref 0.0–0.1)
BASOS PCT: 1 %
EOS PCT: 6 %
Eosinophils Absolute: 0.4 10*3/uL (ref 0.0–0.5)
HCT: 37.5 % (ref 34.8–46.6)
Hemoglobin: 12.4 g/dL (ref 11.6–15.9)
LYMPHS PCT: 23 %
Lymphs Abs: 1.5 10*3/uL (ref 0.9–3.3)
MCH: 28.2 pg (ref 25.1–34.0)
MCHC: 33.1 g/dL (ref 31.5–36.0)
MCV: 85.2 fL (ref 79.5–101.0)
Monocytes Absolute: 0.5 10*3/uL (ref 0.1–0.9)
Monocytes Relative: 7 %
NEUTROS ABS: 4.2 10*3/uL (ref 1.5–6.5)
Neutrophils Relative %: 63 %
Platelet Count: 192 10*3/uL (ref 145–400)
RBC: 4.4 MIL/uL (ref 3.70–5.45)
RDW: 15.5 % — ABNORMAL HIGH (ref 11.2–14.5)
WBC: 6.6 10*3/uL (ref 3.9–10.3)

## 2018-03-16 LAB — IRON AND TIBC
Iron: 76 ug/dL (ref 41–142)
SATURATION RATIOS: 28 % (ref 21–57)
TIBC: 270 ug/dL (ref 236–444)
UIBC: 194 ug/dL

## 2018-03-16 LAB — TSH: TSH: 10.149 u[IU]/mL — ABNORMAL HIGH (ref 0.308–3.960)

## 2018-03-16 LAB — CMP (CANCER CENTER ONLY)
ALBUMIN: 3.9 g/dL (ref 3.5–5.0)
ALT: 19 U/L (ref 0–44)
AST: 21 U/L (ref 15–41)
Alkaline Phosphatase: 146 U/L — ABNORMAL HIGH (ref 38–126)
Anion gap: 9 (ref 5–15)
BUN: 21 mg/dL (ref 8–23)
CO2: 28 mmol/L (ref 22–32)
CREATININE: 0.84 mg/dL (ref 0.44–1.00)
Calcium: 10.1 mg/dL (ref 8.9–10.3)
Chloride: 104 mmol/L (ref 98–111)
GFR, Est AFR Am: >60 mL/min (ref >60)
GLUCOSE: 84 mg/dL (ref 70–99)
Potassium: 3.7 mmol/L (ref 3.5–5.1)
SODIUM: 141 mmol/L (ref 135–145)
Total Bilirubin: 0.4 mg/dL (ref 0.3–1.2)
Total Protein: 7.5 g/dL (ref 6.5–8.1)

## 2018-03-16 LAB — FERRITIN: Ferritin: 324 ng/mL — ABNORMAL HIGH (ref 11–307)

## 2018-03-16 MED ORDER — SODIUM CHLORIDE 0.9% FLUSH
10.0000 mL | INTRAVENOUS | Status: DC | PRN
  Administered 2018-03-16: 10 mL
  Filled 2018-03-16: qty 10

## 2018-03-16 MED ORDER — HEPARIN SOD (PORK) LOCK FLUSH 100 UNIT/ML IV SOLN
500.0000 [IU] | Freq: Once | INTRAVENOUS | Status: AC | PRN
  Administered 2018-03-16: 500 [IU]
  Filled 2018-03-16: qty 5

## 2018-03-16 MED ORDER — SODIUM CHLORIDE 0.9 % IV SOLN
Freq: Once | INTRAVENOUS | Status: AC
  Administered 2018-03-16: 14:00:00 via INTRAVENOUS
  Filled 2018-03-16: qty 250

## 2018-03-16 MED ORDER — SODIUM CHLORIDE 0.9 % IV SOLN
200.0000 mg | Freq: Once | INTRAVENOUS | Status: AC
  Administered 2018-03-16: 200 mg via INTRAVENOUS
  Filled 2018-03-16: qty 8

## 2018-03-16 MED ORDER — SODIUM CHLORIDE 0.9% FLUSH
10.0000 mL | Freq: Once | INTRAVENOUS | Status: AC
  Administered 2018-03-16: 10 mL
  Filled 2018-03-16: qty 10

## 2018-03-16 NOTE — Assessment & Plan Note (Signed)
She has elevated blood pressure now that she is better I recommend resumption of her blood pressure medication and blood pressure monitoring twice a day

## 2018-03-16 NOTE — Patient Instructions (Signed)
[facility] Discharge Instructions for Patients Receiving Chemotherapy  Today you received the following chemotherapy agents:  Keytruda (pembrolizumab)  To help prevent nausea and vomiting after your treatment, we encourage you to take your nausea medication as prescribed.   If you develop nausea and vomiting that is not controlled by your nausea medication, call the clinic.   BELOW ARE SYMPTOMS THAT SHOULD BE REPORTED IMMEDIATELY:  *FEVER GREATER THAN 100.5 F  *CHILLS WITH OR WITHOUT FEVER  NAUSEA AND VOMITING THAT IS NOT CONTROLLED WITH YOUR NAUSEA MEDICATION  *UNUSUAL SHORTNESS OF BREATH  *UNUSUAL BRUISING OR BLEEDING  TENDERNESS IN MOUTH AND THROAT WITH OR WITHOUT PRESENCE OF ULCERS  *URINARY PROBLEMS  *BOWEL PROBLEMS  UNUSUAL RASH Items with * indicate a potential emergency and should be followed up as soon as possible.  Feel free to call the clinic should you have any questions or concerns. The clinic phone number is [phone].  Please show the CHEMO ALERT CARD at check-in to the Emergency Department and triage nurse.   

## 2018-03-16 NOTE — Assessment & Plan Note (Signed)
Her pain is well controlled She will continue morphine sulfate daily and a plan to continue narcotic prescription weaning effort in the future

## 2018-03-16 NOTE — Assessment & Plan Note (Signed)
She is responding well with recent thyroid replacement therapy She will continue the same

## 2018-03-16 NOTE — Progress Notes (Signed)
[place] OFFICE PROGRESS NOTE  Patient Care Team: [name], MD as PCP - General (Family Medicine)  ASSESSMENT & PLAN:  Recurrent carcinoma of endometrium [facility]) She tolerated recent treatment very well Recent CT imaging show excellent response to therapy We will continue treatment as scheduled  Acquired hypothyroidism She is responding well with recent thyroid replacement therapy She will continue the same  Cancer associated pain Her pain is well controlled She will continue morphine sulfate daily and a plan to continue narcotic prescription weaning effort in the future  Essential hypertension She has elevated blood pressure now that she is better I recommend resumption of her blood pressure medication and blood pressure monitoring twice a day   No orders of the defined types were placed in this encounter.   INTERVAL HISTORY: Please see below for problem oriented charting. She returns for further follow-up She feels well Her pain is less She is eating well She denies headaches from high blood pressure Her appetite is stable.  She denies constipation.  She is taking her thyroid medicine as prescribed  SUMMARY OF ONCOLOGIC HISTORY: Oncology History   MSI stable on June 2017 tissue but high from [facility] study from November 2017  05/15/16: ER is moderately positive (70%). PR is strongly positive (80%).     Recurrent carcinoma of endometrium ([place])   01/02/2016 Pathology Results    Uterus +/- tubes/ovaries, neoplastic, cervix ENDOMETRIAL ADENOCARCINOMA, FIGO GRADE 2 (4.7 CM) THE TUMOR INVADES LESS THAN ONE-HALF OF THE MYOMETRIUM (PT1A) ALL MARGINS OF RESECTION ARE NEGATIVE FOR CARCINOMA LEIOMYOMAS AND ADENOMYOSIS BILATERAL FALLOPIAN TUBES AND OVARIES: HISTOLOGICAL UNREMARKABLE 2. Lymph node, sentinel, biopsy, right obturator ONE BENIGN LYMPH NODE (0/1) 3. Lymph nodes, regional resection, left pelvic FOUR BENIGN LYMPH NODES (0/4)     01/02/2016 Surgery    Dr. Denman George performed robotic-assisted laparoscopic total hysterectomy with bilateral salpingoophorectomy, sentinel lymph node biopsy, lymphadenectomy      05/15/2016 Pathology Results    Vagina, biopsy, mid - ADENOCARCINOMA, SEE COMMENT. Microscopic Comment The morphology along with the patient's history are consistent with recurrent endometrioid adenocarcinoma. The carcinoma has a similar appearance to the primary ([id number]).    05/20/2016 Imaging    Ct scan abdomen showed solid 2.5 cm peritoneal mass in the mid to anterior left pelvis, suspicious for peritoneal metastasis. 2. Small expansile low-attenuation filling defect in the left external iliac vein, cannot exclude a small deep venous thrombus. Consider correlation with left lower extremity venous Doppler scan. 3. Small simple fluid density structure in the left pelvic sidewall abutting the left external iliac vessels, favor a small postoperative seroma. 4. No ascites. 5. No lymphadenopathy.  No metastatic disease in the chest. 6. Aortic atherosclerosis.    06/12/2016 PET scan    Intensely hypermetabolic 2.1 cm central pelvic peritoneal mass just to the left of midline, consistent with peritoneal metastatic recurrence. No ascites. 2. No additional hypermetabolic sites of metastatic disease. 3. Diffuse thyroid hypermetabolism without discrete thyroid nodule, favoring thyroiditis. Recommend correlation with serum thyroid function tests.    06/24/2016 - 07/22/2016 Chemotherapy    The patient had weekly cisplatin. She has missed several doses due to infection and pancytopenia    06/24/2016 - 09/04/2016 Radiation Therapy    She completed concurrent radiation therapy Radiation treatment dates:   IMRT : 06/24/16 - 08/01/16 HDR : 08/13/16, 08/20/16, 08/27/16, 09/04/16  Site/dose:   Pelvis treated to 55 Gy in 25 fractions (simultaneous integrated boost technique) Vaginal Cuff treated to 24 Gy in  4 fractions    08/15/2016 -  12/03/2016 Chemotherapy    She received 6 cycles of carboplatin/Taxol    09/27/2016 Imaging    Interval decrease in size of previously described solid peritoneal nodule within the left anterior pelvis. Near complete resolution of previously described low-attenuation structure along the left pelvic sidewall. No evidence for metastatic disease in the chest. Aortic atherosclerosis.    12/30/2016 Imaging    Ct abdomen 1. Solitary left pelvic peritoneal implant is mildly decreased in size in the interval. 2. No new or progressive metastatic disease in the abdomen or pelvis. No ascites. 3. Aortic atherosclerosis.    04/02/2017 Imaging    Left lower quadrant peritoneal implant referenced on previous exam measures 1.7 x 1.3 cm, image 69 of series 2. Increased from 0.8 x 0.8 cm previously peer no new peritoneal implants identified.  Musculoskeletal: The degenerative disc disease noted within the lumbar spine.  IMPRESSION: 1. Solitary left pelvic peritoneal implant is increased in size in the interval. 2. No new sites of disease.  No ascites. 3. Aortic atherosclerosis    04/11/2017 PET scan    1. The left side of pelvis peritoneal implant has decreased in size and degree of FDG uptake compatible with response to therapy. No new areas of peritoneal disease identified. 2. Persistent diffuse increased uptake within the thyroid gland. Correlation with patient's thyroid function may be helpful.    05/26/2017 Imaging    1. Enlarging tumor implant along the left adnexa, currently 2.6 by 2.4 cm and previously 1.9 by 1.3 cm. No new tumor implant or other specific cause for the patient's pelvic symptoms is currently identified. 2.  Aortic Atherosclerosis (ICD10-I70.0). 3. Lumbar spondylosis and degenerative disc disease causing multilevel impingement.    06/04/2017 - 08/28/2017 Chemotherapy    The patient started letrozole and everolimus    08/26/2017 Imaging    Increased size of mass in the left  adnexal region.  Several new small liver metastases in right hepatic lobe    09/08/2017 Imaging    LV EF: 60% -  65%    09/10/2017 Procedure    Technically successful right IJ power-injectable port catheter placement. Ready for routine use    12/04/2017 Imaging    1. Interval increase in size and number of multiple lesions within the liver compatible with hepatic metastatic disease. 2. Interval increase in size mass within the left hemipelvis.    12/15/2017 -  Chemotherapy    The patient had pembrolizumab (KEYTRUDA) 200 mg in sodium chloride 0.9 % 50 mL chemo infusion, 200 mg, Intravenous, Once, 1 of 6 cycles Administration: 200 mg (12/15/2017)  for chemotherapy treatment.     12/21/2017 Imaging    1. Moderate left hydronephrosis and hydroureter, similar compared to most recent CT from May 2019; obstruction appears to be secondary to a left pelvic mass/metastatic focus which has increased in size since the prior CT. 2. Numerous hepatic metastatic lesions, suspect that some may be increased in size but difficult to further characterize without intravenous contrast. Increased size of left pelvic soft tissue mass/metastatic lesion. Mass abuts and possibly invades the sigmoid colon.     02/20/2018 Imaging    Interval decrease in hepatic metastases.  Decreased mass or lymphadenopathy in the left external iliac chain. Interval resolution of left hydroureteronephrosis.  No new or progressive disease within the abdomen or pelvis.     REVIEW OF SYSTEMS:   Constitutional: Denies fevers, chills or abnormal weight loss Eyes: Denies blurriness of vision Ears, nose,  mouth, throat, and face: Denies mucositis or sore throat Respiratory: Denies cough, dyspnea or wheezes Cardiovascular: Denies palpitation, chest discomfort or lower extremity swelling Gastrointestinal:  Denies nausea, heartburn or change in bowel habits Skin: Denies abnormal skin rashes Lymphatics: Denies new lymphadenopathy or easy  bruising Neurological:Denies numbness, tingling or new weaknesses Behavioral/Psych: Mood is stable, no new changes  All other systems were reviewed with the patient and are negative.  I have reviewed the past medical history, past surgical history, social history and family history with the patient and they are unchanged from previous note.  ALLERGIES:  is allergic to latex.  MEDICATIONS:  Current Outpatient Medications  Medication Sig Dispense Refill  . acetaminophen (TYLENOL) 325 MG tablet Take 650 mg by mouth every 6 (six) hours as needed for mild pain or moderate pain.     Marland Kitchen amoxicillin (AMOXIL) 500 MG capsule Take 2,000 mg by mouth See admin instructions. Prior to dental appointment, takes 4 tables prior to appt    . Biotin w/ Vitamins C & E (HAIR/SKIN/NAILS PO) Take 1 tablet by mouth daily.    . Calcium Carb-Cholecalciferol (CALCIUM 600 + D PO) Take 2 tablets by mouth daily.    . celecoxib (CELEBREX) 200 MG capsule Take 1 capsule twice a day with food as needed for chest wall pain.    Marland Kitchen dronabinol (MARINOL) 2.5 MG capsule Take 1 capsule (2.5 mg total) by mouth 2 (two) times daily before lunch and supper. 60 capsule 0  . fluticasone (FLONASE) 50 MCG/ACT nasal spray Place 1 spray into both nostrils 2 (two) times daily as needed for allergies.   1  . gabapentin (NEURONTIN) 600 MG tablet TAKE 1 TABLET BY MOUTH TWICE A DAY (Patient taking differently: Take 1 tablet 2 times a day) 60 tablet 9  . HYDROmorphone (DILAUDID) 4 MG tablet Take 1 tablet (4 mg total) by mouth every 4 (four) hours as needed for severe pain. 60 tablet 0  . ibuprofen (ADVIL,MOTRIN) 200 MG tablet Take 400 mg by mouth every 8 (eight) hours as needed for mild pain.    Marland Kitchen levothyroxine (SYNTHROID, LEVOTHROID) 112 MCG tablet Take 1 tablet (112 mcg total) by mouth daily. 30 tablet 9  . loratadine (CLARITIN) 10 MG tablet Take 10 mg by mouth daily.    Marland Kitchen LORazepam (ATIVAN) 1 MG tablet Take 1 tablet (1 mg total) by mouth every 8  (eight) hours as needed for anxiety or sleep. 90 tablet 0  . Magnesium 250 MG TABS Take 250 mg by mouth daily.    . Melatonin 10 MG CAPS Take 10 mg by mouth at bedtime.    Marland Kitchen morphine (MS CONTIN) 15 MG 12 hr tablet Take 1 tablet (15 mg total) by mouth every 12 (twelve) hours. 60 tablet 0  . ondansetron (ZOFRAN) 8 MG tablet Take 1 tablet (8 mg total) by mouth every 8 (eight) hours as needed for nausea. 60 tablet 1  . traZODone (DESYREL) 50 MG tablet Take 1.5 tablets (75 mg total) by mouth at bedtime. (Patient taking differently: Take 50 mg by mouth at bedtime. ) 135 tablet 1   No current facility-administered medications for this visit.    Facility-Administered Medications Ordered in Other Visits  Medication Dose Route Frequency Provider Last Rate Last Dose  . heparin lock flush 100 unit/mL  500 Units Intracatheter Once PRN Alvy Bimler, Ni, MD      . pembrolizumab (KEYTRUDA) 200 mg in sodium chloride 0.9 % 50 mL chemo infusion  200 mg Intravenous Once  Heath Lark, MD      . sodium chloride flush (NS) 0.9 % injection 10 mL  10 mL Intracatheter PRN Alvy Bimler, Richa Shor, MD        PHYSICAL EXAMINATION: ECOG PERFORMANCE STATUS: 1 - Symptomatic but completely ambulatory  Vitals:   03/16/18 1315  BP: (!) 163/84  Pulse: 69  Resp: 18  Temp: 97.7 F (36.5 C)  SpO2: 100%   Filed Weights   03/16/18 1315  Weight: 185 lb 8 oz (84.1 kg)    GENERAL:alert, no distress and comfortable SKIN: skin color, texture, turgor are normal, no rashes or significant lesions EYES: normal, Conjunctiva are pink and non-injected, sclera clear OROPHARYNX:no exudate, no erythema and lips, buccal mucosa, and tongue normal  NECK: supple, thyroid normal size, non-tender, without nodularity LYMPH:  no palpable lymphadenopathy in the cervical, axillary or inguinal LUNGS: clear to auscultation and percussion with normal breathing effort HEART: regular rate & rhythm and no murmurs and no lower extremity edema ABDOMEN:abdomen soft,  non-tender and normal bowel sounds Musculoskeletal:no cyanosis of digits and no clubbing  NEURO: alert & oriented x 3 with fluent speech, no focal motor/sensory deficits  LABORATORY DATA:  I have reviewed the data as listed    Component Value Date/Time   NA 141 03/16/2018 1255   NA 141 07/09/2017 1312   K 3.7 03/16/2018 1255   K 3.9 07/09/2017 1312   CL 104 03/16/2018 1255   CO2 28 03/16/2018 1255   CO2 27 07/09/2017 1312   GLUCOSE 84 03/16/2018 1255   GLUCOSE 109 07/09/2017 1312   BUN 21 03/16/2018 1255   BUN 16.4 07/09/2017 1312   CREATININE 0.84 03/16/2018 1255   CREATININE 0.8 07/09/2017 1312   CALCIUM 10.1 03/16/2018 1255   CALCIUM 9.2 07/09/2017 1312   PROT 7.5 03/16/2018 1255   PROT 7.1 07/09/2017 1312   ALBUMIN 3.9 03/16/2018 1255   ALBUMIN 3.1 (L) 07/09/2017 1312   AST 21 03/16/2018 1255   AST 35 (H) 07/09/2017 1312   ALT 19 03/16/2018 1255   ALT 43 07/09/2017 1312   ALKPHOS 146 (H) 03/16/2018 1255   ALKPHOS 182 (H) 07/09/2017 1312   BILITOT 0.4 03/16/2018 1255   BILITOT 0.34 07/09/2017 1312   GFRNONAA >60 03/16/2018 1255   GFRAA >60 03/16/2018 1255    No results found for: SPEP, UPEP  Lab Results  Component Value Date   WBC 6.6 03/16/2018   NEUTROABS 4.2 03/16/2018   HGB 12.4 03/16/2018   HCT 37.5 03/16/2018   MCV 85.2 03/16/2018   PLT 192 03/16/2018      Chemistry      Component Value Date/Time   NA 141 03/16/2018 1255   NA 141 07/09/2017 1312   K 3.7 03/16/2018 1255   K 3.9 07/09/2017 1312   CL 104 03/16/2018 1255   CO2 28 03/16/2018 1255   CO2 27 07/09/2017 1312   BUN 21 03/16/2018 1255   BUN 16.4 07/09/2017 1312   CREATININE 0.84 03/16/2018 1255   CREATININE 0.8 07/09/2017 1312      Component Value Date/Time   CALCIUM 10.1 03/16/2018 1255   CALCIUM 9.2 07/09/2017 1312   ALKPHOS 146 (H) 03/16/2018 1255   ALKPHOS 182 (H) 07/09/2017 1312   AST 21 03/16/2018 1255   AST 35 (H) 07/09/2017 1312   ALT 19 03/16/2018 1255   ALT 43  07/09/2017 1312   BILITOT 0.4 03/16/2018 1255   BILITOT 0.34 07/09/2017 1312       RADIOGRAPHIC STUDIES: I have  personally reviewed the radiological images as listed and agreed with the findings in the report. Ct Abdomen Pelvis W Contrast  Result Date: 02/20/2018 CLINICAL DATA:  Followup metastatic endometrial carcinoma. Ongoing immunotherapy. EXAM: CT ABDOMEN AND PELVIS WITH CONTRAST TECHNIQUE: Multidetector CT imaging of the abdomen and pelvis was performed using the standard protocol following bolus administration of intravenous contrast. CONTRAST:  143m OMNIPAQUE IOHEXOL 300 MG/ML  SOLN COMPARISON:  12/03/2017 FINDINGS: Lower Chest: No acute findings. Hepatobiliary: There has been interval decrease in size of hepatic metastases since previous study. Index lesion in the posterior right hepatic lobe measures 2.2 x 1.9 cm on image 28/2, compared to 3.4 x 2.7 cm previously. No new or enlarging liver masses are identified. Gallbladder is unremarkable. No evidence of biliary ductal dilatation. Pancreas:  No mass or inflammatory changes. Spleen: Within normal limits in size and appearance. Adrenals/Urinary Tract: No masses identified. No evidence of hydronephrosis. Stomach/Bowel: No evidence of obstruction, inflammatory process or abnormal fluid collections. Vascular/Lymphatic: Necrotic mass or lymphadenopathy along the left external iliac region has decreased in size since previous study, currently measuring 4.4 x 3.5 cm on image 63/2, compared to 5.2 x 3.8 cm previously. No new or increased lymphadenopathy identified. Reproductive: Prior hysterectomy noted. No mass seen within hysterectomy bed. Other:  None. Musculoskeletal:  No suspicious bone lesions identified. IMPRESSION: Interval decrease in hepatic metastases. Decreased mass or lymphadenopathy in the left external iliac chain. Interval resolution of left hydroureteronephrosis. No new or progressive disease within the abdomen or pelvis.  Electronically Signed   By: JEarle GellM.D.   On: 02/20/2018 14:53    All questions were answered. The patient knows to call the clinic with any problems, questions or concerns. No barriers to learning was detected.  I spent 15 minutes counseling the patient face to face. The total time spent in the appointment was 20 minutes and more than 50% was on counseling and review of test results  NHeath Lark MD 03/16/2018 2:07 PM

## 2018-03-16 NOTE — Assessment & Plan Note (Signed)
She tolerated recent treatment very well Recent CT imaging show excellent response to therapy We will continue treatment as scheduled

## 2018-04-06 ENCOUNTER — Other Ambulatory Visit: Payer: Self-pay | Admitting: Hematology and Oncology

## 2018-04-06 ENCOUNTER — Inpatient Hospital Stay (HOSPITAL_BASED_OUTPATIENT_CLINIC_OR_DEPARTMENT_OTHER): Payer: Medicare Other | Admitting: Hematology and Oncology

## 2018-04-06 ENCOUNTER — Inpatient Hospital Stay: Payer: Medicare Other

## 2018-04-06 ENCOUNTER — Telehealth: Payer: Self-pay | Admitting: Hematology and Oncology

## 2018-04-06 VITALS — BP 144/83 | HR 68 | Temp 97.5°F | Resp 18 | Ht 65.5 in | Wt 192.6 lb

## 2018-04-06 DIAGNOSIS — E039 Hypothyroidism, unspecified: Secondary | ICD-10-CM

## 2018-04-06 DIAGNOSIS — C541 Malignant neoplasm of endometrium: Secondary | ICD-10-CM

## 2018-04-06 DIAGNOSIS — D6481 Anemia due to antineoplastic chemotherapy: Secondary | ICD-10-CM

## 2018-04-06 DIAGNOSIS — G2581 Restless legs syndrome: Secondary | ICD-10-CM

## 2018-04-06 DIAGNOSIS — Z5112 Encounter for antineoplastic immunotherapy: Secondary | ICD-10-CM | POA: Diagnosis not present

## 2018-04-06 DIAGNOSIS — G893 Neoplasm related pain (acute) (chronic): Secondary | ICD-10-CM | POA: Diagnosis not present

## 2018-04-06 DIAGNOSIS — T451X5A Adverse effect of antineoplastic and immunosuppressive drugs, initial encounter: Secondary | ICD-10-CM

## 2018-04-06 LAB — CBC WITH DIFFERENTIAL (CANCER CENTER ONLY)
BASOS ABS: 0.1 10*3/uL (ref 0.0–0.1)
Basophils Relative: 1 %
EOS ABS: 0.4 10*3/uL (ref 0.0–0.5)
EOS PCT: 6 %
HCT: 36.8 % (ref 34.8–46.6)
Hemoglobin: 12.3 g/dL (ref 11.6–15.9)
Lymphocytes Relative: 20 %
Lymphs Abs: 1.2 10*3/uL (ref 0.9–3.3)
MCH: 28.2 pg (ref 25.1–34.0)
MCHC: 33.5 g/dL (ref 31.5–36.0)
MCV: 84.1 fL (ref 79.5–101.0)
Monocytes Absolute: 0.5 10*3/uL (ref 0.1–0.9)
Monocytes Relative: 8 %
NEUTROS PCT: 65 %
Neutro Abs: 3.8 10*3/uL (ref 1.5–6.5)
PLATELETS: 177 10*3/uL (ref 145–400)
RBC: 4.38 MIL/uL (ref 3.70–5.45)
RDW: 16.2 % — ABNORMAL HIGH (ref 11.2–14.5)
WBC Count: 5.8 10*3/uL (ref 3.9–10.3)

## 2018-04-06 LAB — CMP (CANCER CENTER ONLY)
ALBUMIN: 3.7 g/dL (ref 3.5–5.0)
ALT: 23 U/L (ref 0–44)
AST: 21 U/L (ref 15–41)
Alkaline Phosphatase: 168 U/L — ABNORMAL HIGH (ref 38–126)
Anion gap: 7 (ref 5–15)
BUN: 26 mg/dL — AB (ref 8–23)
CHLORIDE: 102 mmol/L (ref 98–111)
CO2: 31 mmol/L (ref 22–32)
Calcium: 9.8 mg/dL (ref 8.9–10.3)
Creatinine: 0.77 mg/dL (ref 0.44–1.00)
Glucose, Bld: 86 mg/dL (ref 70–99)
POTASSIUM: 3.9 mmol/L (ref 3.5–5.1)
SODIUM: 140 mmol/L (ref 135–145)
Total Bilirubin: 0.6 mg/dL (ref 0.3–1.2)
Total Protein: 7.3 g/dL (ref 6.5–8.1)

## 2018-04-06 LAB — IRON AND TIBC
IRON: 100 ug/dL (ref 41–142)
Saturation Ratios: 38 % (ref 21–57)
TIBC: 264 ug/dL (ref 236–444)
UIBC: 163 ug/dL

## 2018-04-06 LAB — FERRITIN: Ferritin: 329 ng/mL — ABNORMAL HIGH (ref 11–307)

## 2018-04-06 LAB — TSH: TSH: 8.646 u[IU]/mL — ABNORMAL HIGH (ref 0.308–3.960)

## 2018-04-06 MED ORDER — SODIUM CHLORIDE 0.9 % IV SOLN
200.0000 mg | Freq: Once | INTRAVENOUS | Status: AC
  Administered 2018-04-06: 200 mg via INTRAVENOUS
  Filled 2018-04-06: qty 8

## 2018-04-06 MED ORDER — HEPARIN SOD (PORK) LOCK FLUSH 100 UNIT/ML IV SOLN
500.0000 [IU] | Freq: Once | INTRAVENOUS | Status: DC | PRN
  Filled 2018-04-06: qty 5

## 2018-04-06 MED ORDER — SODIUM CHLORIDE 0.9% FLUSH
10.0000 mL | INTRAVENOUS | Status: DC | PRN
  Filled 2018-04-06: qty 10

## 2018-04-06 MED ORDER — SODIUM CHLORIDE 0.9% FLUSH
10.0000 mL | Freq: Once | INTRAVENOUS | Status: AC
  Administered 2018-04-06: 10 mL
  Filled 2018-04-06: qty 10

## 2018-04-06 MED ORDER — SODIUM CHLORIDE 0.9 % IV SOLN
Freq: Once | INTRAVENOUS | Status: AC
  Administered 2018-04-06: 13:00:00 via INTRAVENOUS
  Filled 2018-04-06: qty 250

## 2018-04-06 NOTE — Telephone Encounter (Signed)
Gave patient avs and calendar.   °

## 2018-04-06 NOTE — Patient Instructions (Signed)
[facility] Discharge Instructions for Patients Receiving Chemotherapy  Today you received the following chemotherapy agents:  Keytruda (pembrolizumab)  To help prevent nausea and vomiting after your treatment, we encourage you to take your nausea medication as prescribed.   If you develop nausea and vomiting that is not controlled by your nausea medication, call the clinic.   BELOW ARE SYMPTOMS THAT SHOULD BE REPORTED IMMEDIATELY:  *FEVER GREATER THAN 100.5 F  *CHILLS WITH OR WITHOUT FEVER  NAUSEA AND VOMITING THAT IS NOT CONTROLLED WITH YOUR NAUSEA MEDICATION  *UNUSUAL SHORTNESS OF BREATH  *UNUSUAL BRUISING OR BLEEDING  TENDERNESS IN MOUTH AND THROAT WITH OR WITHOUT PRESENCE OF ULCERS  *URINARY PROBLEMS  *BOWEL PROBLEMS  UNUSUAL RASH Items with * indicate a potential emergency and should be followed up as soon as possible.  Feel free to call the clinic should you have any questions or concerns. The clinic phone number is [phone].  Please show the CHEMO ALERT CARD at check-in to the Emergency Department and triage nurse.   

## 2018-04-07 ENCOUNTER — Encounter: Payer: Self-pay | Admitting: Hematology and Oncology

## 2018-04-07 NOTE — Progress Notes (Signed)
[facility] OFFICE PROGRESS NOTE  Patient Care Team: [name], MD as PCP - General (Family Medicine)  ASSESSMENT & PLAN:  Recurrent carcinoma of endometrium [facility]) She tolerated recent treatment very well Recent CT imaging show excellent response to therapy We will continue treatment as scheduled  Restless leg syndrome She has severe recurrent restless leg syndrome I recommend magnesium replacement therapy twice a day  Acquired hypothyroidism She is responding well with recent thyroid replacement therapy She will continue the same  Cancer associated pain Her pain is well controlled She will continue morphine sulfate daily and a plan to continue narcotic prescription weaning effort in the future   Orders Placed This Encounter  Procedures  . CT ABDOMEN PELVIS W CONTRAST    Standing Status:   Future    Standing Expiration Date:   04/07/2019    Order Specific Question:   If indicated for the ordered procedure, I authorize the administration of contrast media per Radiology protocol    Answer:   Yes    Order Specific Question:   Preferred imaging location?    Answer:   [facility]    Order Specific Question:   Radiology Contrast Protocol - do NOT remove file path    Answer:   [email or web address]    INTERVAL HISTORY: Please see below for problem oriented charting. She returns for further follow-up She feels well Her only complaints are restless leg syndrome Denies abdominal bloating, nausea or changes in bowel habits Her chronic cancer pain is stable with current prescription pain medicine. The patient denies any recent signs or symptoms of bleeding such as spontaneous epistaxis, hematuria or hematochezia.  SUMMARY OF ONCOLOGIC HISTORY: Oncology History   MSI stable on June 2017 tissue but high from [facility] study from November 2017  05/15/16: ER is moderately positive (70%). PR is strongly positive (80%).      Recurrent carcinoma of endometrium ([place])   01/02/2016 Pathology Results    Uterus +/- tubes/ovaries, neoplastic, cervix ENDOMETRIAL ADENOCARCINOMA, FIGO GRADE 2 (4.7 CM) THE TUMOR INVADES LESS THAN ONE-HALF OF THE MYOMETRIUM (PT1A) ALL MARGINS OF RESECTION ARE NEGATIVE FOR CARCINOMA LEIOMYOMAS AND ADENOMYOSIS BILATERAL FALLOPIAN TUBES AND OVARIES: HISTOLOGICAL UNREMARKABLE 2. Lymph node, sentinel, biopsy, right obturator ONE BENIGN LYMPH NODE (0/1) 3. Lymph nodes, regional resection, left pelvic FOUR BENIGN LYMPH NODES (0/4)    01/02/2016 Surgery    Dr. Denman George performed robotic-assisted laparoscopic total hysterectomy with bilateral salpingoophorectomy, sentinel lymph node biopsy, lymphadenectomy      05/15/2016 Pathology Results    Vagina, biopsy, mid - ADENOCARCINOMA, SEE COMMENT. Microscopic Comment The morphology along with the patient's history are consistent with recurrent endometrioid adenocarcinoma. The carcinoma has a similar appearance to the primary ([id number]).    05/20/2016 Imaging    Ct scan abdomen showed solid 2.5 cm peritoneal mass in the mid to anterior left pelvis, suspicious for peritoneal metastasis. 2. Small expansile low-attenuation filling defect in the left external iliac vein, cannot exclude a small deep venous thrombus. Consider correlation with left lower extremity venous Doppler scan. 3. Small simple fluid density structure in the left pelvic sidewall abutting the left external iliac vessels, favor a small postoperative seroma. 4. No ascites. 5. No lymphadenopathy.  No metastatic disease in the chest. 6. Aortic atherosclerosis.    06/12/2016 PET scan    Intensely hypermetabolic 2.1 cm central pelvic peritoneal mass just to the left of midline, consistent with peritoneal metastatic recurrence. No ascites. 2. No additional hypermetabolic sites of metastatic  disease. 3. Diffuse thyroid hypermetabolism without discrete thyroid nodule, favoring thyroiditis.  Recommend correlation with serum thyroid function tests.    06/24/2016 - 07/22/2016 Chemotherapy    The patient had weekly cisplatin. She has missed several doses due to infection and pancytopenia    06/24/2016 - 09/04/2016 Radiation Therapy    She completed concurrent radiation therapy Radiation treatment dates:   IMRT : 06/24/16 - 08/01/16 HDR : 08/13/16, 08/20/16, 08/27/16, 09/04/16  Site/dose:   Pelvis treated to 55 Gy in 25 fractions (simultaneous integrated boost technique) Vaginal Cuff treated to 24 Gy in 4 fractions    08/15/2016 - 12/03/2016 Chemotherapy    She received 6 cycles of carboplatin/Taxol    09/27/2016 Imaging    Interval decrease in size of previously described solid peritoneal nodule within the left anterior pelvis. Near complete resolution of previously described low-attenuation structure along the left pelvic sidewall. No evidence for metastatic disease in the chest. Aortic atherosclerosis.    12/30/2016 Imaging    Ct abdomen 1. Solitary left pelvic peritoneal implant is mildly decreased in size in the interval. 2. No new or progressive metastatic disease in the abdomen or pelvis. No ascites. 3. Aortic atherosclerosis.    04/02/2017 Imaging    Left lower quadrant peritoneal implant referenced on previous exam measures 1.7 x 1.3 cm, image 69 of series 2. Increased from 0.8 x 0.8 cm previously peer no new peritoneal implants identified.  Musculoskeletal: The degenerative disc disease noted within the lumbar spine.  IMPRESSION: 1. Solitary left pelvic peritoneal implant is increased in size in the interval. 2. No new sites of disease.  No ascites. 3. Aortic atherosclerosis    04/11/2017 PET scan    1. The left side of pelvis peritoneal implant has decreased in size and degree of FDG uptake compatible with response to therapy. No new areas of peritoneal disease identified. 2. Persistent diffuse increased uptake within the thyroid gland. Correlation with patient's  thyroid function may be helpful.    05/26/2017 Imaging    1. Enlarging tumor implant along the left adnexa, currently 2.6 by 2.4 cm and previously 1.9 by 1.3 cm. No new tumor implant or other specific cause for the patient's pelvic symptoms is currently identified. 2.  Aortic Atherosclerosis (ICD10-I70.0). 3. Lumbar spondylosis and degenerative disc disease causing multilevel impingement.    06/04/2017 - 08/28/2017 Chemotherapy    The patient started letrozole and everolimus    08/26/2017 Imaging    Increased size of mass in the left adnexal region.  Several new small liver metastases in right hepatic lobe    09/08/2017 Imaging    LV EF: 60% -  65%    09/10/2017 Procedure    Technically successful right IJ power-injectable port catheter placement. Ready for routine use    12/04/2017 Imaging    1. Interval increase in size and number of multiple lesions within the liver compatible with hepatic metastatic disease. 2. Interval increase in size mass within the left hemipelvis.    12/15/2017 -  Chemotherapy    The patient had pembrolizumab (KEYTRUDA) 200 mg in sodium chloride 0.9 % 50 mL chemo infusion, 200 mg, Intravenous, Once, 1 of 6 cycles Administration: 200 mg (12/15/2017)  for chemotherapy treatment.     12/21/2017 Imaging    1. Moderate left hydronephrosis and hydroureter, similar compared to most recent CT from May 2019; obstruction appears to be secondary to a left pelvic mass/metastatic focus which has increased in size since the prior CT. 2. Numerous hepatic metastatic lesions,  suspect that some may be increased in size but difficult to further characterize without intravenous contrast. Increased size of left pelvic soft tissue mass/metastatic lesion. Mass abuts and possibly invades the sigmoid colon.     02/20/2018 Imaging    Interval decrease in hepatic metastases.  Decreased mass or lymphadenopathy in the left external iliac chain. Interval resolution of left  hydroureteronephrosis.  No new or progressive disease within the abdomen or pelvis.     REVIEW OF SYSTEMS:   Constitutional: Denies fevers, chills or abnormal weight loss Eyes: Denies blurriness of vision Ears, nose, mouth, throat, and face: Denies mucositis or sore throat Respiratory: Denies cough, dyspnea or wheezes Cardiovascular: Denies palpitation, chest discomfort or lower extremity swelling Gastrointestinal:  Denies nausea, heartburn or change in bowel habits Skin: Denies abnormal skin rashes Lymphatics: Denies new lymphadenopathy or easy bruising Neurological:Denies numbness, tingling or new weaknesses Behavioral/Psych: Mood is stable, no new changes  All other systems were reviewed with the patient and are negative.  I have reviewed the past medical history, past surgical history, social history and family history with the patient and they are unchanged from previous note.  ALLERGIES:  is allergic to latex.  MEDICATIONS:  Current Outpatient Medications  Medication Sig Dispense Refill  . acetaminophen (TYLENOL) 325 MG tablet Take 650 mg by mouth every 6 (six) hours as needed for mild pain or moderate pain.     Marland Kitchen amoxicillin (AMOXIL) 500 MG capsule Take 2,000 mg by mouth See admin instructions. Prior to dental appointment, takes 4 tables prior to appt    . Biotin w/ Vitamins C & E (HAIR/SKIN/NAILS PO) Take 1 tablet by mouth daily.    . Calcium Carb-Cholecalciferol (CALCIUM 600 + D PO) Take 2 tablets by mouth daily.    . celecoxib (CELEBREX) 200 MG capsule Take 1 capsule twice a day with food as needed for chest wall pain.    Marland Kitchen dronabinol (MARINOL) 2.5 MG capsule Take 1 capsule (2.5 mg total) by mouth 2 (two) times daily before lunch and supper. 60 capsule 0  . fluticasone (FLONASE) 50 MCG/ACT nasal spray Place 1 spray into both nostrils 2 (two) times daily as needed for allergies.   1  . gabapentin (NEURONTIN) 600 MG tablet TAKE 1 TABLET BY MOUTH TWICE A DAY (Patient taking  differently: Take 1 tablet 2 times a day) 60 tablet 9  . HYDROmorphone (DILAUDID) 4 MG tablet Take 1 tablet (4 mg total) by mouth every 4 (four) hours as needed for severe pain. 60 tablet 0  . ibuprofen (ADVIL,MOTRIN) 200 MG tablet Take 400 mg by mouth every 8 (eight) hours as needed for mild pain.    Marland Kitchen levothyroxine (SYNTHROID, LEVOTHROID) 112 MCG tablet Take 1 tablet (112 mcg total) by mouth daily. 30 tablet 9  . loratadine (CLARITIN) 10 MG tablet Take 10 mg by mouth daily.    Marland Kitchen LORazepam (ATIVAN) 1 MG tablet Take 1 tablet (1 mg total) by mouth every 8 (eight) hours as needed for anxiety or sleep. 90 tablet 0  . Magnesium 250 MG TABS Take 250 mg by mouth daily.    . Melatonin 10 MG CAPS Take 10 mg by mouth at bedtime.    Marland Kitchen morphine (MS CONTIN) 15 MG 12 hr tablet Take 1 tablet (15 mg total) by mouth every 12 (twelve) hours. 60 tablet 0  . ondansetron (ZOFRAN) 8 MG tablet Take 1 tablet (8 mg total) by mouth every 8 (eight) hours as needed for nausea. 60 tablet 1  .  traZODone (DESYREL) 50 MG tablet Take 1.5 tablets (75 mg total) by mouth at bedtime. (Patient taking differently: Take 50 mg by mouth at bedtime. ) 135 tablet 1   No current facility-administered medications for this visit.     PHYSICAL EXAMINATION: ECOG PERFORMANCE STATUS: 1 - Symptomatic but completely ambulatory  Vitals:   04/06/18 1223  BP: (!) 144/83  Pulse: 68  Resp: 18  Temp: (!) 97.5 F (36.4 C)  SpO2: 100%   Filed Weights   04/06/18 1223  Weight: 192 lb 9.6 oz (87.4 kg)    GENERAL:alert, no distress and comfortable SKIN: skin color, texture, turgor are normal, no rashes or significant lesions EYES: normal, Conjunctiva are pink and non-injected, sclera clear OROPHARYNX:no exudate, no erythema and lips, buccal mucosa, and tongue normal  NECK: supple, thyroid normal size, non-tender, without nodularity LYMPH:  no palpable lymphadenopathy in the cervical, axillary or inguinal LUNGS: clear to auscultation and  percussion with normal breathing effort HEART: regular rate & rhythm and no murmurs and no lower extremity edema ABDOMEN:abdomen soft, non-tender and normal bowel sounds Musculoskeletal:no cyanosis of digits and no clubbing  NEURO: alert & oriented x 3 with fluent speech, no focal motor/sensory deficits  LABORATORY DATA:  I have reviewed the data as listed    Component Value Date/Time   NA 140 04/06/2018 1143   NA 141 07/09/2017 1312   K 3.9 04/06/2018 1143   K 3.9 07/09/2017 1312   CL 102 04/06/2018 1143   CO2 31 04/06/2018 1143   CO2 27 07/09/2017 1312   GLUCOSE 86 04/06/2018 1143   GLUCOSE 109 07/09/2017 1312   BUN 26 (H) 04/06/2018 1143   BUN 16.4 07/09/2017 1312   CREATININE 0.77 04/06/2018 1143   CREATININE 0.8 07/09/2017 1312   CALCIUM 9.8 04/06/2018 1143   CALCIUM 9.2 07/09/2017 1312   PROT 7.3 04/06/2018 1143   PROT 7.1 07/09/2017 1312   ALBUMIN 3.7 04/06/2018 1143   ALBUMIN 3.1 (L) 07/09/2017 1312   AST 21 04/06/2018 1143   AST 35 (H) 07/09/2017 1312   ALT 23 04/06/2018 1143   ALT 43 07/09/2017 1312   ALKPHOS 168 (H) 04/06/2018 1143   ALKPHOS 182 (H) 07/09/2017 1312   BILITOT 0.6 04/06/2018 1143   BILITOT 0.34 07/09/2017 1312   GFRNONAA >60 04/06/2018 1143   GFRAA >60 04/06/2018 1143    No results found for: SPEP, UPEP  Lab Results  Component Value Date   WBC 5.8 04/06/2018   NEUTROABS 3.8 04/06/2018   HGB 12.3 04/06/2018   HCT 36.8 04/06/2018   MCV 84.1 04/06/2018   PLT 177 04/06/2018      Chemistry      Component Value Date/Time   NA 140 04/06/2018 1143   NA 141 07/09/2017 1312   K 3.9 04/06/2018 1143   K 3.9 07/09/2017 1312   CL 102 04/06/2018 1143   CO2 31 04/06/2018 1143   CO2 27 07/09/2017 1312   BUN 26 (H) 04/06/2018 1143   BUN 16.4 07/09/2017 1312   CREATININE 0.77 04/06/2018 1143   CREATININE 0.8 07/09/2017 1312      Component Value Date/Time   CALCIUM 9.8 04/06/2018 1143   CALCIUM 9.2 07/09/2017 1312   ALKPHOS 168 (H)  04/06/2018 1143   ALKPHOS 182 (H) 07/09/2017 1312   AST 21 04/06/2018 1143   AST 35 (H) 07/09/2017 1312   ALT 23 04/06/2018 1143   ALT 43 07/09/2017 1312   BILITOT 0.6 04/06/2018 1143   BILITOT 0.34  07/09/2017 1312      All questions were answered. The patient knows to call the clinic with any problems, questions or concerns. No barriers to learning was detected.  I spent 15 minutes counseling the patient face to face. The total time spent in the appointment was 20 minutes and more than 50% was on counseling and review of test results  Heath Lark, MD 04/07/2018 9:44 AM

## 2018-04-07 NOTE — Assessment & Plan Note (Signed)
She is responding well with recent thyroid replacement therapy She will continue the same

## 2018-04-07 NOTE — Assessment & Plan Note (Signed)
She tolerated recent treatment very well Recent CT imaging show excellent response to therapy We will continue treatment as scheduled

## 2018-04-07 NOTE — Assessment & Plan Note (Signed)
She has severe recurrent restless leg syndrome I recommend magnesium replacement therapy twice a day

## 2018-04-07 NOTE — Assessment & Plan Note (Signed)
Her pain is well controlled She will continue morphine sulfate daily and a plan to continue narcotic prescription weaning effort in the future

## 2018-04-27 ENCOUNTER — Inpatient Hospital Stay: Payer: Medicare Other

## 2018-04-27 ENCOUNTER — Telehealth: Payer: Self-pay | Admitting: Hematology and Oncology

## 2018-04-27 ENCOUNTER — Inpatient Hospital Stay: Payer: Medicare Other | Attending: Hematology and Oncology

## 2018-04-27 ENCOUNTER — Inpatient Hospital Stay (HOSPITAL_BASED_OUTPATIENT_CLINIC_OR_DEPARTMENT_OTHER): Payer: Medicare Other | Admitting: Hematology and Oncology

## 2018-04-27 DIAGNOSIS — C541 Malignant neoplasm of endometrium: Secondary | ICD-10-CM

## 2018-04-27 DIAGNOSIS — K5909 Other constipation: Secondary | ICD-10-CM | POA: Diagnosis not present

## 2018-04-27 DIAGNOSIS — Z5112 Encounter for antineoplastic immunotherapy: Secondary | ICD-10-CM | POA: Insufficient documentation

## 2018-04-27 DIAGNOSIS — G893 Neoplasm related pain (acute) (chronic): Secondary | ICD-10-CM | POA: Insufficient documentation

## 2018-04-27 DIAGNOSIS — Z23 Encounter for immunization: Secondary | ICD-10-CM

## 2018-04-27 DIAGNOSIS — D6481 Anemia due to antineoplastic chemotherapy: Secondary | ICD-10-CM

## 2018-04-27 DIAGNOSIS — Z79899 Other long term (current) drug therapy: Secondary | ICD-10-CM | POA: Diagnosis not present

## 2018-04-27 DIAGNOSIS — E039 Hypothyroidism, unspecified: Secondary | ICD-10-CM

## 2018-04-27 DIAGNOSIS — T451X5A Adverse effect of antineoplastic and immunosuppressive drugs, initial encounter: Secondary | ICD-10-CM

## 2018-04-27 LAB — CBC WITH DIFFERENTIAL (CANCER CENTER ONLY)
Abs Immature Granulocytes: 0.02 10*3/uL (ref 0.00–0.07)
Basophils Absolute: 0.1 10*3/uL (ref 0.0–0.1)
Basophils Relative: 1 %
EOS PCT: 2 %
Eosinophils Absolute: 0.1 10*3/uL (ref 0.0–0.5)
HEMATOCRIT: 35.3 % — AB (ref 36.0–46.0)
HEMOGLOBIN: 11.9 g/dL — AB (ref 12.0–15.0)
Immature Granulocytes: 0 %
LYMPHS PCT: 17 %
Lymphs Abs: 1.1 10*3/uL (ref 0.7–4.0)
MCH: 29 pg (ref 26.0–34.0)
MCHC: 33.7 g/dL (ref 30.0–36.0)
MCV: 85.9 fL (ref 80.0–100.0)
MONO ABS: 0.4 10*3/uL (ref 0.1–1.0)
MONOS PCT: 6 %
Neutro Abs: 4.7 10*3/uL (ref 1.7–7.7)
Neutrophils Relative %: 74 %
Platelet Count: 184 10*3/uL (ref 150–400)
RBC: 4.11 MIL/uL (ref 3.87–5.11)
RDW: 13.8 % (ref 11.5–15.5)
WBC: 6.3 10*3/uL (ref 4.0–10.5)
nRBC: 0 % (ref 0.0–0.2)

## 2018-04-27 LAB — CMP (CANCER CENTER ONLY)
ALK PHOS: 143 U/L — AB (ref 38–126)
ALT: 15 U/L (ref 0–44)
ANION GAP: 9 (ref 5–15)
AST: 17 U/L (ref 15–41)
Albumin: 3.7 g/dL (ref 3.5–5.0)
BILIRUBIN TOTAL: 0.5 mg/dL (ref 0.3–1.2)
BUN: 22 mg/dL (ref 8–23)
CALCIUM: 9.6 mg/dL (ref 8.9–10.3)
CO2: 26 mmol/L (ref 22–32)
CREATININE: 0.86 mg/dL (ref 0.44–1.00)
Chloride: 104 mmol/L (ref 98–111)
GFR, Estimated: >60 mL/min (ref >60)
Glucose, Bld: 117 mg/dL — ABNORMAL HIGH (ref 70–99)
Potassium: 3.8 mmol/L (ref 3.5–5.1)
Sodium: 139 mmol/L (ref 135–145)
TOTAL PROTEIN: 7.2 g/dL (ref 6.5–8.1)

## 2018-04-27 LAB — FERRITIN: Ferritin: 327 ng/mL — ABNORMAL HIGH (ref 11–307)

## 2018-04-27 LAB — TSH: TSH: 1.596 u[IU]/mL (ref 0.308–3.960)

## 2018-04-27 LAB — IRON AND TIBC
IRON: 92 ug/dL (ref 41–142)
SATURATION RATIOS: 38 % (ref 21–57)
TIBC: 240 ug/dL (ref 236–444)
UIBC: 148 ug/dL

## 2018-04-27 MED ORDER — SODIUM CHLORIDE 0.9 % IV SOLN
Freq: Once | INTRAVENOUS | Status: DC
  Filled 2018-04-27: qty 250

## 2018-04-27 MED ORDER — HEPARIN SOD (PORK) LOCK FLUSH 100 UNIT/ML IV SOLN
500.0000 [IU] | Freq: Once | INTRAVENOUS | Status: AC | PRN
  Administered 2018-04-27: 500 [IU]
  Filled 2018-04-27: qty 5

## 2018-04-27 MED ORDER — INFLUENZA VAC SPLIT QUAD 0.5 ML IM SUSY
0.5000 mL | PREFILLED_SYRINGE | Freq: Once | INTRAMUSCULAR | Status: AC
  Administered 2018-04-27: 0.5 mL via INTRAMUSCULAR

## 2018-04-27 MED ORDER — SODIUM CHLORIDE 0.9 % IV SOLN
200.0000 mg | Freq: Once | INTRAVENOUS | Status: AC
  Administered 2018-04-27: 200 mg via INTRAVENOUS
  Filled 2018-04-27: qty 8

## 2018-04-27 MED ORDER — SODIUM CHLORIDE 0.9% FLUSH
10.0000 mL | Freq: Once | INTRAVENOUS | Status: AC
  Administered 2018-04-27: 10 mL
  Filled 2018-04-27: qty 10

## 2018-04-27 MED ORDER — SODIUM CHLORIDE 0.9% FLUSH
10.0000 mL | INTRAVENOUS | Status: DC | PRN
  Administered 2018-04-27: 10 mL
  Filled 2018-04-27: qty 10

## 2018-04-27 MED ORDER — MORPHINE SULFATE ER 15 MG PO TBCR: 15.0000 mg | EXTENDED_RELEASE_TABLET | Freq: Two times a day (BID) | ORAL | 0 refills | Status: DC

## 2018-04-27 MED ORDER — INFLUENZA VAC SPLIT QUAD 0.5 ML IM SUSY
PREFILLED_SYRINGE | INTRAMUSCULAR | Status: AC
  Filled 2018-04-27: qty 0.5

## 2018-04-27 NOTE — Patient Instructions (Signed)
[facility] Discharge Instructions for Patients Receiving Chemotherapy  Today you received the following chemotherapy agents:  Keytruda (pembrolizumab)  To help prevent nausea and vomiting after your treatment, we encourage you to take your nausea medication as prescribed.   If you develop nausea and vomiting that is not controlled by your nausea medication, call the clinic.   BELOW ARE SYMPTOMS THAT SHOULD BE REPORTED IMMEDIATELY:  *FEVER GREATER THAN 100.5 F  *CHILLS WITH OR WITHOUT FEVER  NAUSEA AND VOMITING THAT IS NOT CONTROLLED WITH YOUR NAUSEA MEDICATION  *UNUSUAL SHORTNESS OF BREATH  *UNUSUAL BRUISING OR BLEEDING  TENDERNESS IN MOUTH AND THROAT WITH OR WITHOUT PRESENCE OF ULCERS  *URINARY PROBLEMS  *BOWEL PROBLEMS  UNUSUAL RASH Items with * indicate a potential emergency and should be followed up as soon as possible.  Feel free to call the clinic should you have any questions or concerns. The clinic phone number is [phone].  Please show the CHEMO ALERT CARD at check-in to the Emergency Department and triage nurse.   

## 2018-04-27 NOTE — Telephone Encounter (Signed)
Gave avs and calendar ° °

## 2018-04-28 ENCOUNTER — Other Ambulatory Visit: Payer: Self-pay | Admitting: Hematology and Oncology

## 2018-04-28 ENCOUNTER — Encounter: Payer: Self-pay | Admitting: Hematology and Oncology

## 2018-04-28 NOTE — Assessment & Plan Note (Signed)
She continues to have chronic constipation We discussed aggressive laxative therapy

## 2018-04-28 NOTE — Assessment & Plan Note (Signed)
Her pain is well controlled She will continue morphine sulfate daily and a plan to continue narcotic prescription weaning effort in the future

## 2018-04-28 NOTE — Assessment & Plan Note (Signed)
Her TSH has normalized Currently, she is taking approximately 150 mcg of thyroid replacement therapy Once her old prescription runs out, we will refill her prescription

## 2018-04-28 NOTE — Assessment & Plan Note (Addendum)
She tolerated recent treatment very well Last CT imaging show excellent response to therapy We will continue treatment as scheduled She is due for repeat CT imaging next month

## 2018-04-28 NOTE — Progress Notes (Signed)
[facility] OFFICE PROGRESS NOTE  Patient Care Team: Michelle Sacramento, MD as PCP - General (Family Medicine)  ASSESSMENT & PLAN:  Recurrent carcinoma of endometrium Michelle Rosario) She tolerated recent treatment very well Last CT imaging show excellent response to therapy We will continue treatment as scheduled She is due for repeat CT imaging next month  Cancer associated pain Her pain is well controlled She will continue morphine sulfate daily and a plan to continue narcotic prescription weaning effort in the future  Acquired hypothyroidism Her TSH has normalized Currently, she is taking approximately 150 mcg of thyroid replacement therapy Once her old prescription runs out, we will refill her prescription  Chronic constipation She continues to have chronic constipation We discussed aggressive laxative therapy   No orders of the defined types were placed in this encounter.   INTERVAL HISTORY: Please see below for problem oriented charting. She returns for further follow-up She feels well She continues to have chronic constipation She has chronic intermittent left groin pain but stable with current prescription pain medicine No recent nausea or vomiting No recent infection, fever or chills  SUMMARY OF ONCOLOGIC HISTORY: Oncology History   MSI stable on June 2017 tissue but high from [facility] study from November 2017  05/15/16: ER is moderately positive (70%). PR is strongly positive (80%).     Recurrent carcinoma of endometrium (Michelle Rosario)   01/02/2016 Pathology Results    Uterus +/- tubes/ovaries, neoplastic, cervix ENDOMETRIAL ADENOCARCINOMA, FIGO GRADE 2 (4.7 CM) THE TUMOR INVADES LESS THAN ONE-HALF OF THE MYOMETRIUM (PT1A) ALL MARGINS OF RESECTION ARE NEGATIVE FOR CARCINOMA LEIOMYOMAS AND ADENOMYOSIS BILATERAL FALLOPIAN TUBES AND OVARIES: HISTOLOGICAL UNREMARKABLE 2. Lymph node, sentinel, biopsy, right obturator ONE BENIGN LYMPH NODE (0/1) 3. Lymph nodes,  regional resection, left pelvic FOUR BENIGN LYMPH NODES (0/4)    01/02/2016 Surgery    Dr. Denman George performed robotic-assisted laparoscopic total hysterectomy with bilateral salpingoophorectomy, sentinel lymph node biopsy, lymphadenectomy      05/15/2016 Pathology Results    Vagina, biopsy, mid - ADENOCARCINOMA, SEE COMMENT. Microscopic Comment The morphology along with the patient's history are consistent with recurrent endometrioid adenocarcinoma. The carcinoma has a similar appearance to the primary ([id number]).    05/20/2016 Imaging    Ct scan abdomen showed solid 2.5 cm peritoneal mass in the mid to anterior left pelvis, suspicious for peritoneal metastasis. 2. Small expansile low-attenuation filling defect in the left external iliac vein, cannot exclude a small deep venous thrombus. Consider correlation with left lower extremity venous Doppler scan. 3. Small simple fluid density structure in the left pelvic sidewall abutting the left external iliac vessels, favor a small postoperative seroma. 4. No ascites. 5. No lymphadenopathy.  No metastatic disease in the chest. 6. Aortic atherosclerosis.    06/12/2016 PET scan    Intensely hypermetabolic 2.1 cm central pelvic peritoneal mass just to the left of midline, consistent with peritoneal metastatic recurrence. No ascites. 2. No additional hypermetabolic sites of metastatic disease. 3. Diffuse thyroid hypermetabolism without discrete thyroid nodule, favoring thyroiditis. Recommend correlation with serum thyroid function tests.    06/24/2016 - 07/22/2016 Chemotherapy    The patient had weekly cisplatin. She has missed several doses due to infection and pancytopenia    06/24/2016 - 09/04/2016 Radiation Therapy    She completed concurrent radiation therapy Radiation treatment dates:   IMRT : 06/24/16 - 08/01/16 HDR : 08/13/16, 08/20/16, 08/27/16, 09/04/16  Site/dose:   Pelvis treated to 55 Gy in 25 fractions (simultaneous integrated boost  technique)  Vaginal Cuff treated to 24 Gy in 4 fractions    08/15/2016 - 12/03/2016 Chemotherapy    She received 6 cycles of carboplatin/Taxol    09/27/2016 Imaging    Interval decrease in size of previously described solid peritoneal nodule within the left anterior pelvis. Near complete resolution of previously described low-attenuation structure along the left pelvic sidewall. No evidence for metastatic disease in the chest. Aortic atherosclerosis.    12/30/2016 Imaging    Ct abdomen 1. Solitary left pelvic peritoneal implant is mildly decreased in size in the interval. 2. No new or progressive metastatic disease in the abdomen or pelvis. No ascites. 3. Aortic atherosclerosis.    04/02/2017 Imaging    Left lower quadrant peritoneal implant referenced on previous exam measures 1.7 x 1.3 cm, image 69 of series 2. Increased from 0.8 x 0.8 cm previously peer no new peritoneal implants identified.  Musculoskeletal: The degenerative disc disease noted within the lumbar spine.  IMPRESSION: 1. Solitary left pelvic peritoneal implant is increased in size in the interval. 2. No new sites of disease.  No ascites. 3. Aortic atherosclerosis    04/11/2017 PET scan    1. The left side of pelvis peritoneal implant has decreased in size and degree of FDG uptake compatible with response to therapy. No new areas of peritoneal disease identified. 2. Persistent diffuse increased uptake within the thyroid gland. Correlation with patient's thyroid function may be helpful.    05/26/2017 Imaging    1. Enlarging tumor implant along the left adnexa, currently 2.6 by 2.4 cm and previously 1.9 by 1.3 cm. No new tumor implant or other specific cause for the patient's pelvic symptoms is currently identified. 2.  Aortic Atherosclerosis (ICD10-I70.0). 3. Lumbar spondylosis and degenerative disc disease causing multilevel impingement.    06/04/2017 - 08/28/2017 Chemotherapy    The patient started letrozole and  everolimus    08/26/2017 Imaging    Increased size of mass in the left adnexal region.  Several new small liver metastases in right hepatic lobe    09/08/2017 Imaging    LV EF: 60% -  65%    09/10/2017 Procedure    Technically successful right IJ power-injectable port catheter placement. Ready for routine use    12/04/2017 Imaging    1. Interval increase in size and number of multiple lesions within the liver compatible with hepatic metastatic disease. 2. Interval increase in size mass within the left hemipelvis.    12/15/2017 -  Chemotherapy    The patient had pembrolizumab (KEYTRUDA) 200 mg in sodium chloride 0.9 % 50 mL chemo infusion, 200 mg, Intravenous, Once, 1 of 6 cycles Administration: 200 mg (12/15/2017)  for chemotherapy treatment.     12/21/2017 Imaging    1. Moderate left hydronephrosis and hydroureter, similar compared to most recent CT from May 2019; obstruction appears to be secondary to a left pelvic mass/metastatic focus which has increased in size since the prior CT. 2. Numerous hepatic metastatic lesions, suspect that some may be increased in size but difficult to further characterize without intravenous contrast. Increased size of left pelvic soft tissue mass/metastatic lesion. Mass abuts and possibly invades the sigmoid colon.     02/20/2018 Imaging    Interval decrease in hepatic metastases.  Decreased mass or lymphadenopathy in the left external iliac chain. Interval resolution of left hydroureteronephrosis.  No new or progressive disease within the abdomen or pelvis.     REVIEW OF SYSTEMS:   Constitutional: Denies fevers, chills or abnormal weight loss Eyes:  Denies blurriness of vision Ears, nose, mouth, throat, and face: Denies mucositis or sore throat Respiratory: Denies cough, dyspnea or wheezes Cardiovascular: Denies palpitation, chest discomfort or lower extremity swelling Skin: Denies abnormal skin rashes Lymphatics: Denies new lymphadenopathy or  easy bruising Neurological:Denies numbness, tingling or new weaknesses Behavioral/Psych: Mood is stable, no new changes  All other systems were reviewed with the patient and are negative.  I have reviewed the past medical history, past surgical history, social history and family history with the patient and they are unchanged from previous note.  ALLERGIES:  is allergic to latex.  MEDICATIONS:  Current Outpatient Medications  Medication Sig Dispense Refill  . acetaminophen (TYLENOL) 325 MG tablet Take 650 mg by mouth every 6 (six) hours as needed for mild pain or moderate pain.     Marland Kitchen amoxicillin (AMOXIL) 500 MG capsule Take 2,000 mg by mouth See admin instructions. Prior to dental appointment, takes 4 tables prior to appt    . Biotin w/ Vitamins C & E (HAIR/SKIN/NAILS PO) Take 1 tablet by mouth daily.    . Calcium Carb-Cholecalciferol (CALCIUM 600 + D PO) Take 2 tablets by mouth daily.    . celecoxib (CELEBREX) 200 MG capsule Take 1 capsule twice a day with food as needed for chest wall pain.    Marland Kitchen dronabinol (MARINOL) 2.5 MG capsule Take 1 capsule (2.5 mg total) by mouth 2 (two) times daily before lunch and supper. 60 capsule 0  . fluticasone (FLONASE) 50 MCG/ACT nasal spray Place 1 spray into both nostrils 2 (two) times daily as needed for allergies.   1  . gabapentin (NEURONTIN) 600 MG tablet TAKE 1 TABLET BY MOUTH TWICE A DAY (Patient taking differently: Take 1 tablet 2 times a day) 60 tablet 9  . HYDROmorphone (DILAUDID) 4 MG tablet Take 1 tablet (4 mg total) by mouth every 4 (four) hours as needed for severe pain. 60 tablet 0  . ibuprofen (ADVIL,MOTRIN) 200 MG tablet Take 400 mg by mouth every 8 (eight) hours as needed for mild pain.    Marland Kitchen levothyroxine (SYNTHROID, LEVOTHROID) 112 MCG tablet Take 1 tablet (112 mcg total) by mouth daily. 30 tablet 9  . loratadine (CLARITIN) 10 MG tablet Take 10 mg by mouth daily.    Marland Kitchen LORazepam (ATIVAN) 1 MG tablet TAKE 1 TABLET BY MOUTH EVERY 8 HOURS AS  NEEDED FOR ANXIETY OR SLEEP 90 tablet 0  . Magnesium 250 MG TABS Take 250 mg by mouth daily.    . Melatonin 10 MG CAPS Take 10 mg by mouth at bedtime.    Marland Kitchen morphine (MS CONTIN) 15 MG 12 hr tablet Take 1 tablet (15 mg total) by mouth every 12 (twelve) hours. 60 tablet 0  . ondansetron (ZOFRAN) 8 MG tablet Take 1 tablet (8 mg total) by mouth every 8 (eight) hours as needed for nausea. 60 tablet 1  . traZODone (DESYREL) 50 MG tablet Take 1.5 tablets (75 mg total) by mouth at bedtime. (Patient taking differently: Take 50 mg by mouth at bedtime. ) 135 tablet 1   No current facility-administered medications for this visit.     PHYSICAL EXAMINATION: ECOG PERFORMANCE STATUS: 1 - Symptomatic but completely ambulatory  Vitals:   04/27/18 1342  BP: (!) 118/59  Pulse: 80  Resp: 18  Temp: 97.9 F (36.6 C)  SpO2: 100%   Filed Weights   04/27/18 1342  Weight: 190 lb (86.2 kg)    GENERAL:alert, no distress and comfortable SKIN: skin color, texture, turgor  are normal, no rashes or significant lesions EYES: normal, Conjunctiva are pink and non-injected, sclera clear OROPHARYNX:no exudate, no erythema and lips, buccal mucosa, and tongue normal  NECK: supple, thyroid normal size, non-tender, without nodularity LYMPH:  no palpable lymphadenopathy in the cervical, axillary or inguinal LUNGS: clear to auscultation and percussion with normal breathing effort HEART: regular rate & rhythm and no murmurs and no lower extremity edema ABDOMEN:abdomen soft, non-tender and normal bowel sounds Musculoskeletal:no cyanosis of digits and no clubbing  NEURO: alert & oriented x 3 with fluent speech, no focal motor/sensory deficits  LABORATORY DATA:  I have reviewed the data as listed    Component Value Date/Time   NA 139 04/27/2018 1303   NA 141 07/09/2017 1312   K 3.8 04/27/2018 1303   K 3.9 07/09/2017 1312   CL 104 04/27/2018 1303   CO2 26 04/27/2018 1303   CO2 27 07/09/2017 1312   GLUCOSE 117 (H)  04/27/2018 1303   GLUCOSE 109 07/09/2017 1312   BUN 22 04/27/2018 1303   BUN 16.4 07/09/2017 1312   CREATININE 0.86 04/27/2018 1303   CREATININE 0.8 07/09/2017 1312   CALCIUM 9.6 04/27/2018 1303   CALCIUM 9.2 07/09/2017 1312   PROT 7.2 04/27/2018 1303   PROT 7.1 07/09/2017 1312   ALBUMIN 3.7 04/27/2018 1303   ALBUMIN 3.1 (L) 07/09/2017 1312   AST 17 04/27/2018 1303   AST 35 (H) 07/09/2017 1312   ALT 15 04/27/2018 1303   ALT 43 07/09/2017 1312   ALKPHOS 143 (H) 04/27/2018 1303   ALKPHOS 182 (H) 07/09/2017 1312   BILITOT 0.5 04/27/2018 1303   BILITOT 0.34 07/09/2017 1312   GFRNONAA >60 04/27/2018 1303   GFRAA >60 04/27/2018 1303    No results found for: SPEP, UPEP  Lab Results  Component Value Date   WBC 6.3 04/27/2018   NEUTROABS 4.7 04/27/2018   HGB 11.9 (L) 04/27/2018   HCT 35.3 (L) 04/27/2018   MCV 85.9 04/27/2018   PLT 184 04/27/2018      Chemistry      Component Value Date/Time   NA 139 04/27/2018 1303   NA 141 07/09/2017 1312   K 3.8 04/27/2018 1303   K 3.9 07/09/2017 1312   CL 104 04/27/2018 1303   CO2 26 04/27/2018 1303   CO2 27 07/09/2017 1312   BUN 22 04/27/2018 1303   BUN 16.4 07/09/2017 1312   CREATININE 0.86 04/27/2018 1303   CREATININE 0.8 07/09/2017 1312      Component Value Date/Time   CALCIUM 9.6 04/27/2018 1303   CALCIUM 9.2 07/09/2017 1312   ALKPHOS 143 (H) 04/27/2018 1303   ALKPHOS 182 (H) 07/09/2017 1312   AST 17 04/27/2018 1303   AST 35 (H) 07/09/2017 1312   ALT 15 04/27/2018 1303   ALT 43 07/09/2017 1312   BILITOT 0.5 04/27/2018 1303   BILITOT 0.34 07/09/2017 1312      All questions were answered. The patient knows to call the clinic with any problems, questions or concerns. No barriers to learning was detected.  I spent 15 minutes counseling the patient face to face. The total time spent in the appointment was 20 minutes and more than 50% was on counseling and review of test results  Heath Lark, MD 04/28/2018 1:49 PM

## 2018-05-07 MED ORDER — METHYLPREDNISOLONE SODIUM SUCC 40 MG IJ SOLR
INTRAMUSCULAR | Status: AC
  Filled 2018-05-07: qty 1

## 2018-05-07 MED ORDER — DIPHENHYDRAMINE HCL 25 MG PO CAPS
ORAL_CAPSULE | ORAL | Status: AC
  Filled 2018-05-07: qty 1

## 2018-05-07 MED ORDER — FAMOTIDINE IN NACL 20-0.9 MG/50ML-% IV SOLN
INTRAVENOUS | Status: AC
  Filled 2018-05-07: qty 50

## 2018-05-09 MED ORDER — ACETAMINOPHEN 325 MG PO TABS
ORAL_TABLET | ORAL | Status: AC
  Filled 2018-05-09: qty 2

## 2018-05-14 MED ORDER — DIPHENHYDRAMINE HCL 50 MG/ML IJ SOLN
INTRAMUSCULAR | Status: AC
  Filled 2018-05-14: qty 1

## 2018-05-14 MED ORDER — METHYLPREDNISOLONE SODIUM SUCC 40 MG IJ SOLR
INTRAMUSCULAR | Status: AC
  Filled 2018-05-14: qty 1

## 2018-05-15 ENCOUNTER — Encounter (HOSPITAL_COMMUNITY): Payer: Self-pay

## 2018-05-15 ENCOUNTER — Inpatient Hospital Stay: Payer: Medicare Other | Attending: Hematology and Oncology

## 2018-05-15 ENCOUNTER — Inpatient Hospital Stay: Payer: Medicare Other

## 2018-05-15 ENCOUNTER — Ambulatory Visit (HOSPITAL_COMMUNITY)
Admission: RE | Admit: 2018-05-15 | Discharge: 2018-05-15 | Disposition: A | Discharge status: Home / Self Care | Payer: Medicare Other | Source: Ambulatory Visit | Attending: Hematology and Oncology | Admitting: Hematology and Oncology

## 2018-05-15 DIAGNOSIS — C541 Malignant neoplasm of endometrium: Secondary | ICD-10-CM | POA: Insufficient documentation

## 2018-05-15 DIAGNOSIS — E039 Hypothyroidism, unspecified: Secondary | ICD-10-CM | POA: Insufficient documentation

## 2018-05-15 DIAGNOSIS — Z5112 Encounter for antineoplastic immunotherapy: Secondary | ICD-10-CM | POA: Diagnosis not present

## 2018-05-15 DIAGNOSIS — C786 Secondary malignant neoplasm of retroperitoneum and peritoneum: Secondary | ICD-10-CM | POA: Diagnosis not present

## 2018-05-15 DIAGNOSIS — G893 Neoplasm related pain (acute) (chronic): Secondary | ICD-10-CM | POA: Insufficient documentation

## 2018-05-15 DIAGNOSIS — K5909 Other constipation: Secondary | ICD-10-CM | POA: Diagnosis not present

## 2018-05-15 DIAGNOSIS — T451X5A Adverse effect of antineoplastic and immunosuppressive drugs, initial encounter: Secondary | ICD-10-CM

## 2018-05-15 DIAGNOSIS — Z79899 Other long term (current) drug therapy: Secondary | ICD-10-CM | POA: Diagnosis not present

## 2018-05-15 DIAGNOSIS — C787 Secondary malignant neoplasm of liver and intrahepatic bile duct: Secondary | ICD-10-CM | POA: Insufficient documentation

## 2018-05-15 DIAGNOSIS — D6481 Anemia due to antineoplastic chemotherapy: Secondary | ICD-10-CM

## 2018-05-15 LAB — CBC WITH DIFFERENTIAL (CANCER CENTER ONLY)
ABS IMMATURE GRANULOCYTES: 0.02 10*3/uL (ref 0.00–0.07)
BASOS PCT: 1 %
Basophils Absolute: 0 10*3/uL (ref 0.0–0.1)
Eosinophils Absolute: 0.1 10*3/uL (ref 0.0–0.5)
Eosinophils Relative: 2 %
HCT: 35.7 % — ABNORMAL LOW (ref 36.0–46.0)
Hemoglobin: 12.1 g/dL (ref 12.0–15.0)
IMMATURE GRANULOCYTES: 0 %
Lymphocytes Relative: 18 %
Lymphs Abs: 1.2 10*3/uL (ref 0.7–4.0)
MCH: 29.4 pg (ref 26.0–34.0)
MCHC: 33.9 g/dL (ref 30.0–36.0)
MCV: 86.7 fL (ref 80.0–100.0)
MONOS PCT: 6 %
Monocytes Absolute: 0.4 10*3/uL (ref 0.1–1.0)
NEUTROS ABS: 5 10*3/uL (ref 1.7–7.7)
Neutrophils Relative %: 73 %
PLATELETS: 189 10*3/uL (ref 150–400)
RBC: 4.12 MIL/uL (ref 3.87–5.11)
RDW: 13.2 % (ref 11.5–15.5)
WBC: 6.8 10*3/uL (ref 4.0–10.5)
nRBC: 0 % (ref 0.0–0.2)

## 2018-05-15 LAB — CMP (CANCER CENTER ONLY)
ALBUMIN: 3.8 g/dL (ref 3.5–5.0)
ALT: 16 U/L (ref 0–44)
ANION GAP: 9 (ref 5–15)
AST: 21 U/L (ref 15–41)
Alkaline Phosphatase: 148 U/L — ABNORMAL HIGH (ref 38–126)
BILIRUBIN TOTAL: 0.6 mg/dL (ref 0.3–1.2)
BUN: 21 mg/dL (ref 8–23)
CO2: 28 mmol/L (ref 22–32)
Calcium: 9.8 mg/dL (ref 8.9–10.3)
Chloride: 100 mmol/L (ref 98–111)
Creatinine: 0.87 mg/dL (ref 0.44–1.00)
GFR, Est AFR Am: >60 mL/min (ref >60)
GFR, Estimated: >60 mL/min (ref >60)
GLUCOSE: 87 mg/dL (ref 70–99)
POTASSIUM: 3.9 mmol/L (ref 3.5–5.1)
Sodium: 137 mmol/L (ref 135–145)
TOTAL PROTEIN: 7.3 g/dL (ref 6.5–8.1)

## 2018-05-15 LAB — TSH: TSH: 1.299 u[IU]/mL (ref 0.308–3.960)

## 2018-05-15 LAB — IRON AND TIBC
IRON: 89 ug/dL (ref 41–142)
SATURATION RATIOS: 34 % (ref 21–57)
TIBC: 260 ug/dL (ref 236–444)
UIBC: 171 ug/dL (ref 120–384)

## 2018-05-15 LAB — FERRITIN: Ferritin: 362 ng/mL — ABNORMAL HIGH (ref 11–307)

## 2018-05-15 MED ORDER — HEPARIN SOD (PORK) LOCK FLUSH 100 UNIT/ML IV SOLN
INTRAVENOUS | Status: AC
  Administered 2018-05-15: 500 [IU] via INTRAVENOUS
  Filled 2018-05-15: qty 5

## 2018-05-15 MED ORDER — SODIUM CHLORIDE (PF) 0.9 % IJ SOLN
INTRAMUSCULAR | Status: AC
  Filled 2018-05-15: qty 50

## 2018-05-15 MED ORDER — IOHEXOL 300 MG/ML  SOLN
100.0000 mL | Freq: Once | INTRAMUSCULAR | Status: AC | PRN
  Administered 2018-05-15: 100 mL via INTRAVENOUS

## 2018-05-15 MED ORDER — HEPARIN SOD (PORK) LOCK FLUSH 100 UNIT/ML IV SOLN
500.0000 [IU] | Freq: Once | INTRAVENOUS | Status: AC
  Administered 2018-05-15: 500 [IU] via INTRAVENOUS

## 2018-05-17 NOTE — Progress Notes (Signed)
[facility]  Telephone:([phone]) [phone] [phone]) [phone]  Clinic Follow up Note   Patient Care Team: [name], MD as PCP - General (Family Medicine) 05/18/2018  Chief Complaint F/u on recurrent carcinoma of endometrium    SUMMARY OF ONCOLOGIC HISTORY: Oncology History   MSI stable on June 2017 tissue but high from [place] study from November 2017  05/15/16: ER is moderately positive (70%). PR is strongly positive (80%).     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 Lymph node, sentinel, biopsy, right obturator ONE BENIGN LYMPH NODE (0/1) 3. Lymph nodes, regional resection, left pelvic FOUR BENIGN LYMPH NODES (0/4)    01/02/2016 Surgery    Dr. Denman George performed robotic-assisted laparoscopic total hysterectomy with bilateral salpingoophorectomy, sentinel lymph node biopsy, lymphadenectomy      05/15/2016 Pathology Results    Vagina, biopsy, mid - ADENOCARCINOMA, SEE COMMENT. Microscopic Comment The morphology along with the patient's history are consistent with recurrent endometrioid adenocarcinoma. The carcinoma has a similar appearance to the primary ([id number]).    05/20/2016 Imaging    Ct scan abdomen showed solid 2.5 cm peritoneal mass in the mid to anterior left pelvis, suspicious for peritoneal metastasis. 2. Small expansile low-attenuation filling defect in the left external iliac vein, cannot exclude a small deep venous thrombus. Consider correlation with left lower extremity venous Doppler scan. 3. Small simple fluid density structure in the left pelvic sidewall abutting the left external iliac vessels,  favor a small postoperative seroma. 4. No ascites. 5. No lymphadenopathy.  No metastatic disease in the chest. 6. Aortic atherosclerosis.    06/12/2016 PET scan    Intensely hypermetabolic 2.1 cm central pelvic peritoneal mass just to the left of midline, consistent with peritoneal metastatic recurrence. No ascites. 2. No additional hypermetabolic sites of metastatic disease. 3. Diffuse thyroid hypermetabolism without discrete thyroid nodule, favoring thyroiditis. Recommend correlation with serum thyroid function tests.    06/24/2016 - 07/22/2016 Chemotherapy    The patient had weekly cisplatin. She has missed several doses due to infection and pancytopenia    06/24/2016 - 09/04/2016 Radiation Therapy    She completed concurrent radiation therapy Radiation treatment dates:   IMRT : 06/24/16 - 08/01/16 HDR : 08/13/16, 08/20/16, 08/27/16, 09/04/16  Site/dose:   Pelvis treated to 55 Gy in 25 fractions (simultaneous integrated boost technique) Vaginal Cuff treated to 24 Gy in 4 fractions    08/15/2016 - 12/03/2016 Chemotherapy    She received 6 cycles of carboplatin/Taxol    09/27/2016 Imaging    Interval decrease in size of previously described solid peritoneal nodule within the left anterior pelvis. Near complete resolution of previously described low-attenuation structure along the left pelvic sidewall. No evidence for metastatic disease in the chest. Aortic atherosclerosis.    12/30/2016 Imaging    Ct abdomen 1. Solitary left pelvic peritoneal implant is mildly decreased in size in the interval. 2. No new or progressive metastatic disease in the abdomen or pelvis. No ascites. 3. Aortic atherosclerosis.    04/02/2017 Imaging    Left lower quadrant peritoneal implant referenced on previous exam measures 1.7 x 1.3 cm, image 69 of series 2. Increased from 0.8 x 0.8 cm previously peer no new peritoneal implants identified.  Musculoskeletal: The degenerative disc disease noted within the lumbar  spine.  IMPRESSION: 1. Solitary left pelvic peritoneal implant is increased in size in the interval. 2. No new sites of disease.  No ascites. 3. Aortic atherosclerosis    04/11/2017 PET scan    1. The left side of pelvis peritoneal implant has decreased in size and degree of FDG uptake compatible with response to therapy. No new areas of peritoneal disease identified. 2. Persistent diffuse increased uptake within the thyroid gland. Correlation with patient's thyroid function may be helpful.    05/26/2017 Imaging    1. Enlarging tumor implant along the left adnexa, currently 2.6 by 2.4 cm and previously 1.9 by 1.3 cm. No new tumor implant or other specific cause for the patient's pelvic symptoms is currently identified. 2.  Aortic Atherosclerosis (ICD10-I70.0). 3. Lumbar spondylosis and degenerative disc disease causing multilevel impingement.    06/04/2017 - 08/28/2017 Chemotherapy    The patient started letrozole and everolimus    08/26/2017 Imaging    Increased size of mass in the left adnexal region.  Several new small liver metastases in right hepatic lobe    09/08/2017 Imaging    LV EF: 60% -  65%    09/10/2017 Procedure    Technically successful right IJ power-injectable port catheter placement. Ready for routine use    12/04/2017 Imaging    1. Interval increase in size and number of multiple lesions within the liver compatible with hepatic metastatic disease. 2. Interval increase in size mass within the left hemipelvis.    12/15/2017 -  Chemotherapy    The patient had pembrolizumab (KEYTRUDA) 200 mg in sodium chloride 0.9 % 50 mL chemo infusion, 200 mg, Intravenous, Once, 1 of 6 cycles Administration: 200 mg (12/15/2017)  for chemotherapy treatment.     12/21/2017 Imaging    1. Moderate left hydronephrosis and hydroureter, similar compared to most recent CT from May 2019; obstruction appears to be secondary to a left pelvic mass/metastatic focus which has increased in size  since the prior CT. 2. Numerous hepatic metastatic lesions, suspect that some may be increased in size but difficult to further characterize without intravenous contrast. Increased size of left pelvic soft tissue mass/metastatic lesion. Mass abuts and possibly invades the sigmoid colon.     02/20/2018 Imaging    Interval decrease in hepatic metastases.  Decreased mass or lymphadenopathy in the left external iliac chain. Interval resolution of left hydroureteronephrosis.  No new or progressive disease within the abdomen or pelvis.    05/16/2018 Imaging    05/16/2018 CT Abdomen IMPRESSION: 1. Slight interval decrease in size of hepatic metastatic disease. 2. Slight interval decrease in size centrally necrotic mass within the left external iliac region.    CURRENT THERAPY: Keytruda every 2 weeks  INTERVAL HISTORY: Michelle Rosario is a 72 y.o. female who is here for follow-up. She is Dr. Calton Dach patient and I am seeing her in Dr. Calton Dach absense.  Today, she is here with her sister. She is doing well. She gets intermittent constipation and had an episode of fresh bleeding from her rectum yesterday. She uses laxatives for her constipation. Her symptoms have now resolved and she denied bleeding today. She is very happy about her recent scan results.   REVIEW OF SYSTEMS:  Constitutional: Denies fevers, chills or abnormal weight loss Eyes: Denies blurriness of vision Ears, nose, mouth, throat, and face: Denies mucositis or sore throat Respiratory: Denies cough, dyspnea or wheezes Cardiovascular: Denies palpitation, chest discomfort or  lower extremity swelling Gastrointestinal:  Denies nausea, heartburn or change in bowel habits (+) one episode of fresh bleeding per rectum yesterday (+) intermittent constipation and abdominal pain Skin: Denies abnormal skin rashes Lymphatics: Denies new lymphadenopathy or easy bruising Neurological:Denies numbness, tingling or new  weaknesses Behavioral/Psych: Mood is stable, no new changes  All other systems were reviewed with the patient and are negative.  MEDICAL HISTORY:  Past Medical History:  Diagnosis Date  . Broken ribs    hx of  . Family history of adverse reaction to anesthesia    mother post op N&V  . History of radiation therapy 06/24/16-08/01/16 and 08/13/16, 08/20/16, 08/27/16, 09/04/16   pelvis treated to 55 Gy in 25 fractions, vaginal cuff treated to 24 Gy in 4 fractions  . Hypertension   . Hypothyroidism   . Uterine cancer [facility])    May 30 , 2017    SURGICAL HISTORY: Past Surgical History:  Procedure Laterality Date  . achellis tendon repaired in 2005    . ANKLE SURGERY Right Oct 2006  . fisure  2014  . IR FLUORO GUIDE PORT INSERTION RIGHT  09/10/2017  . IR US GUIDE VASC ACCESS RIGHT  09/10/2017  . LYMPH NODE BIOPSY N/A 01/02/2016   Procedure: SENTINAL LYMPH NODE BIOPSY;  Surgeon: Everitt Amber, MD;  Location: [facility];  Service: Gynecology;  Laterality: N/A;  . rectal fissure surgery 2010    . REPLACEMENT TOTAL KNEE Left 2015  . ROBOTIC ASSISTED TOTAL HYSTERECTOMY WITH BILATERAL SALPINGO OOPHERECTOMY Bilateral 01/02/2016   Procedure: XI ROBOTIC ASSISTED TOTAL LAPAROSCOPIC HYSTERECTOMY WITH BILATERAL SALPINGO OOPHORECTOMY;  Surgeon: Everitt Amber, MD;  Location: [facility];  Service: Gynecology;  Laterality: Bilateral;  . TUBAL LIGATION      I have reviewed the social history and family history with the patient and they are unchanged from previous note.  ALLERGIES:  is allergic to latex.  MEDICATIONS:  Current Outpatient Medications  Medication Sig Dispense Refill  . acetaminophen (TYLENOL) 325 MG tablet Take 650 mg by mouth every 6 (six) hours as needed for mild pain or moderate pain.     Marland Kitchen amoxicillin (AMOXIL) 500 MG capsule Take 2,000 mg by mouth See admin instructions. Prior to dental appointment, takes 4 tables prior to appt    . Biotin w/ Vitamins C & E (HAIR/SKIN/NAILS PO) Take 1 tablet by mouth  daily.    . Calcium Carb-Cholecalciferol (CALCIUM 600 + D PO) Take 2 tablets by mouth daily.    . celecoxib (CELEBREX) 200 MG capsule Take 1 capsule twice a day with food as needed for chest wall pain.    Marland Kitchen dronabinol (MARINOL) 2.5 MG capsule Take 1 capsule (2.5 mg total) by mouth 2 (two) times daily before lunch and supper. 60 capsule 0  . fluticasone (FLONASE) 50 MCG/ACT nasal spray Place 1 spray into both nostrils 2 (two) times daily as needed for allergies.   1  . gabapentin (NEURONTIN) 600 MG tablet TAKE 1 TABLET BY MOUTH TWICE A DAY (Patient taking differently: Take 1 tablet 2 times a day) 60 tablet 9  . HYDROmorphone (DILAUDID) 4 MG tablet Take 1 tablet (4 mg total) by mouth every 4 (four) hours as needed for severe pain. 60 tablet 0  . ibuprofen (ADVIL,MOTRIN) 200 MG tablet Take 400 mg by mouth every 8 (eight) hours as needed for mild pain.    Marland Kitchen levothyroxine (SYNTHROID, LEVOTHROID) 112 MCG tablet Take 1 tablet (112 mcg total) by mouth daily. 30 tablet 9  . loratadine (  CLARITIN) 10 MG tablet Take 10 mg by mouth daily.    Marland Kitchen LORazepam (ATIVAN) 1 MG tablet TAKE 1 TABLET BY MOUTH EVERY 8 HOURS AS NEEDED FOR ANXIETY OR SLEEP 90 tablet 0  . Magnesium 250 MG TABS Take 250 mg by mouth daily.    . Melatonin 10 MG CAPS Take 10 mg by mouth at bedtime.    Marland Kitchen morphine (MS CONTIN) 15 MG 12 hr tablet Take 1 tablet (15 mg total) by mouth every 12 (twelve) hours. 60 tablet 0  . ondansetron (ZOFRAN) 8 MG tablet Take 1 tablet (8 mg total) by mouth every 8 (eight) hours as needed for nausea. 60 tablet 1  . traZODone (DESYREL) 50 MG tablet Take 1.5 tablets (75 mg total) by mouth at bedtime. (Patient taking differently: Take 50 mg by mouth at bedtime. ) 135 tablet 1   No current facility-administered medications for this visit.     PHYSICAL EXAMINATION: ECOG PERFORMANCE STATUS: 1 - Symptomatic but completely ambulatory  Vitals:   05/18/18 1323  BP: 139/82  Pulse: 69  Resp: 18  Temp: 97.7 F (36.5 C)   SpO2: 100%   Filed Weights   05/18/18 1323  Weight: 188 lb 9.6 oz (85.5 kg)    GENERAL:alert, no distress and comfortable SKIN: skin color, texture, turgor are normal, no rashes or significant lesions EYES: normal, Conjunctiva are pink and non-injected, sclera clear OROPHARYNX:no exudate, no erythema and lips, buccal mucosa, and tongue normal  NECK: supple, thyroid normal size, non-tender, without nodularity LYMPH:  no palpable lymphadenopathy in the cervical, axillary or inguinal LUNGS: clear to auscultation and percussion with normal breathing effort HEART: regular rate & rhythm and no murmurs and no lower extremity edema ABDOMEN:abdomen soft, non-tender and normal bowel sounds Musculoskeletal:no cyanosis of digits and no clubbing  NEURO: alert & oriented x 3 with fluent speech, no focal motor/sensory deficits  LABORATORY DATA:  I have reviewed the data as listed CBC Latest Ref Rng & Units 05/15/2018 04/27/2018 04/06/2018  WBC 4.0 - 10.5 K/uL 6.8 6.3 5.8  Hemoglobin 12.0 - 15.0 g/dL 12.1 11.9(L) 12.3  Hematocrit 36.0 - 46.0 % 35.7(L) 35.3(L) 36.8  Platelets 150 - 400 K/uL 189 184 177     CMP Latest Ref Rng & Units 05/15/2018 04/27/2018 04/06/2018  Glucose 70 - 99 mg/dL 87 117(H) 86  BUN 8 - 23 mg/dL 21 22 26(H)  Creatinine 0.44 - 1.00 mg/dL 0.87 0.86 0.77  Sodium 135 - 145 mmol/L 137 139 140  Potassium 3.5 - 5.1 mmol/L 3.9 3.8 3.9  Chloride 98 - 111 mmol/L 100 104 102  CO2 22 - 32 mmol/L '28 26 31  ' Calcium 8.9 - 10.3 mg/dL 9.8 9.6 9.8  Total Protein 6.5 - 8.1 g/dL 7.3 7.2 7.3  Total Bilirubin 0.3 - 1.2 mg/dL 0.6 0.5 0.6  Alkaline Phos 38 - 126 U/L 148(H) 143(H) 168(H)  AST 15 - 41 U/L '21 17 21  ' ALT 0 - 44 U/L '16 15 23      ' RADIOGRAPHIC STUDIES: I have personally reviewed the radiological images as listed and agreed with the findings in the report.  05/16/2018 CT Abdomen IMPRESSION: 1. Slight interval decrease in size of hepatic metastatic disease. 2. Slight interval  decrease in size centrally necrotic mass within the left external iliac region.   No results found.   ASSESSMENT & PLAN:  GRACEANNA THEISSEN is a 72 y.o. female with history of   1. 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   -She is currently on immunotherapy Keytruda, tolerating recent treatment very well -We will continue treatment as scheduled -I reviewed and discussed her restaging CT Abdomen scan and pelvis w contrast from 05/15/2018, which showed decreased liver and peritoneal metastasis, no other new lesions. She is very happy about her results.  -Labs reviewed, CBC showed Hg 12.1 ALP 148. Iron studies WNLs and Ferritin 362. TSH normal.  She is clinically doing very well. -We will proceed Keytruda today and continue every 3 weeks -f/u with Dr. Alvy Bimler in 3 weeks  2. Cancer associated pain -Her pain is well controlled -She will continue morphine ER 15 mg every 12 hours, Dilaudid and Celebrex as needed  3. Acquired hypothyroidism -Her TSH has normalized -Currently, she is taking approximately 150 mcg of thyroid replacement therapy -Last TSH was WNLs  4. Chronic constipation -She continues to have chronic constipation -conmtinue laxative therapy  Plan -scan and lab reviewed, adequate for treatment, will proceed to culture today and continue every 3 weeks  -f/u in 3 weeks with Dr. Alvy Bimler    No problem-specific Assessment & Plan notes found for this encounter.   No orders of the defined types were placed in this encounter.  All questions were answered. The patient knows to call the clinic with any problems, questions or concerns. No barriers to learning was detected. I spent 25 minutes counseling the patient face to face. The total time spent in the appointment was 30 minutes and more than 50% was on counseling and review of test results    I, Noor Dweik am acting as scribe for Dr. Truitt Merle.  I  have reviewed the above documentation for accuracy and completeness, and I agree with the above.     Truitt Merle, MD 05/18/2018

## 2018-05-18 ENCOUNTER — Inpatient Hospital Stay: Payer: Medicare Other

## 2018-05-18 ENCOUNTER — Encounter: Payer: Self-pay | Admitting: Hematology

## 2018-05-18 ENCOUNTER — Inpatient Hospital Stay (HOSPITAL_BASED_OUTPATIENT_CLINIC_OR_DEPARTMENT_OTHER): Payer: Medicare Other | Admitting: Hematology

## 2018-05-18 VITALS — BP 139/82 | HR 69 | Temp 97.7°F | Resp 18 | Wt 188.6 lb

## 2018-05-18 DIAGNOSIS — C541 Malignant neoplasm of endometrium: Secondary | ICD-10-CM

## 2018-05-18 DIAGNOSIS — G893 Neoplasm related pain (acute) (chronic): Secondary | ICD-10-CM | POA: Diagnosis not present

## 2018-05-18 DIAGNOSIS — K5909 Other constipation: Secondary | ICD-10-CM

## 2018-05-18 DIAGNOSIS — C787 Secondary malignant neoplasm of liver and intrahepatic bile duct: Secondary | ICD-10-CM | POA: Diagnosis not present

## 2018-05-18 DIAGNOSIS — C786 Secondary malignant neoplasm of retroperitoneum and peritoneum: Secondary | ICD-10-CM | POA: Diagnosis not present

## 2018-05-18 DIAGNOSIS — Z5112 Encounter for antineoplastic immunotherapy: Secondary | ICD-10-CM | POA: Diagnosis not present

## 2018-05-18 DIAGNOSIS — E039 Hypothyroidism, unspecified: Secondary | ICD-10-CM

## 2018-05-18 MED ORDER — SODIUM CHLORIDE 0.9 % IV SOLN
Freq: Once | INTRAVENOUS | Status: AC
  Administered 2018-05-18: 14:00:00 via INTRAVENOUS
  Filled 2018-05-18: qty 250

## 2018-05-18 MED ORDER — SODIUM CHLORIDE 0.9 % IV SOLN
200.0000 mg | Freq: Once | INTRAVENOUS | Status: AC
  Administered 2018-05-18: 200 mg via INTRAVENOUS
  Filled 2018-05-18: qty 8

## 2018-05-18 MED ORDER — SODIUM CHLORIDE 0.9% FLUSH
10.0000 mL | INTRAVENOUS | Status: DC | PRN
  Administered 2018-05-18: 10 mL
  Filled 2018-05-18: qty 10

## 2018-05-18 MED ORDER — HEPARIN SOD (PORK) LOCK FLUSH 100 UNIT/ML IV SOLN
500.0000 [IU] | Freq: Once | INTRAVENOUS | Status: AC | PRN
  Administered 2018-05-18: 500 [IU]
  Filled 2018-05-18: qty 5

## 2018-05-18 NOTE — Patient Instructions (Signed)
[facility] Discharge Instructions for Patients Receiving Chemotherapy  Today you received the following chemotherapy agents:  Keytruda (pembrolizumab)  To help prevent nausea and vomiting after your treatment, we encourage you to take your nausea medication as prescribed.   If you develop nausea and vomiting that is not controlled by your nausea medication, call the clinic.   BELOW ARE SYMPTOMS THAT SHOULD BE REPORTED IMMEDIATELY:  *FEVER GREATER THAN 100.5 F  *CHILLS WITH OR WITHOUT FEVER  NAUSEA AND VOMITING THAT IS NOT CONTROLLED WITH YOUR NAUSEA MEDICATION  *UNUSUAL SHORTNESS OF BREATH  *UNUSUAL BRUISING OR BLEEDING  TENDERNESS IN MOUTH AND THROAT WITH OR WITHOUT PRESENCE OF ULCERS  *URINARY PROBLEMS  *BOWEL PROBLEMS  UNUSUAL RASH Items with * indicate a potential emergency and should be followed up as soon as possible.  Feel free to call the clinic should you have any questions or concerns. The clinic phone number is [phone].  Please show the CHEMO ALERT CARD at check-in to the Emergency Department and triage nurse.   

## 2018-05-19 ENCOUNTER — Telehealth: Payer: Self-pay

## 2018-05-19 NOTE — Telephone Encounter (Signed)
Per 11/11 no los 

## 2018-05-22 MED ORDER — MIDAZOLAM HCL 2 MG/2ML IJ SOLN
INTRAMUSCULAR | Status: AC
  Filled 2018-05-22: qty 2

## 2018-05-22 MED ORDER — ONDANSETRON HCL 4 MG/2ML IJ SOLN
INTRAMUSCULAR | Status: AC
  Filled 2018-05-22: qty 2

## 2018-05-22 MED ORDER — FENTANYL CITRATE (PF) 100 MCG/2ML IJ SOLN
INTRAMUSCULAR | Status: AC
  Filled 2018-05-22: qty 2

## 2018-05-22 MED ORDER — DIPHENHYDRAMINE HCL 50 MG/ML IJ SOLN
INTRAMUSCULAR | Status: AC
  Filled 2018-05-22: qty 1

## 2018-06-08 ENCOUNTER — Inpatient Hospital Stay: Payer: Medicare Other

## 2018-06-08 ENCOUNTER — Telehealth: Payer: Self-pay | Admitting: Hematology and Oncology

## 2018-06-08 ENCOUNTER — Inpatient Hospital Stay: Payer: Medicare Other | Attending: Hematology and Oncology

## 2018-06-08 ENCOUNTER — Inpatient Hospital Stay (HOSPITAL_BASED_OUTPATIENT_CLINIC_OR_DEPARTMENT_OTHER): Payer: Medicare Other | Admitting: Hematology and Oncology

## 2018-06-08 ENCOUNTER — Encounter: Payer: Self-pay | Admitting: Hematology and Oncology

## 2018-06-08 VITALS — BP 118/68 | HR 81 | Temp 98.1°F | Resp 18 | Ht 65.5 in | Wt 191.4 lb

## 2018-06-08 DIAGNOSIS — C541 Malignant neoplasm of endometrium: Secondary | ICD-10-CM

## 2018-06-08 DIAGNOSIS — E039 Hypothyroidism, unspecified: Secondary | ICD-10-CM | POA: Diagnosis not present

## 2018-06-08 DIAGNOSIS — T451X5A Adverse effect of antineoplastic and immunosuppressive drugs, initial encounter: Secondary | ICD-10-CM

## 2018-06-08 DIAGNOSIS — Z5112 Encounter for antineoplastic immunotherapy: Secondary | ICD-10-CM | POA: Insufficient documentation

## 2018-06-08 DIAGNOSIS — C787 Secondary malignant neoplasm of liver and intrahepatic bile duct: Secondary | ICD-10-CM | POA: Diagnosis not present

## 2018-06-08 DIAGNOSIS — G893 Neoplasm related pain (acute) (chronic): Secondary | ICD-10-CM

## 2018-06-08 DIAGNOSIS — D6481 Anemia due to antineoplastic chemotherapy: Secondary | ICD-10-CM | POA: Diagnosis not present

## 2018-06-08 LAB — CMP (CANCER CENTER ONLY)
ALBUMIN: 3.6 g/dL (ref 3.5–5.0)
ALK PHOS: 148 U/L — AB (ref 38–126)
ALT: 19 U/L (ref 0–44)
ANION GAP: 10 (ref 5–15)
AST: 17 U/L (ref 15–41)
BUN: 18 mg/dL (ref 8–23)
CALCIUM: 9.4 mg/dL (ref 8.9–10.3)
CO2: 27 mmol/L (ref 22–32)
CREATININE: 0.88 mg/dL (ref 0.44–1.00)
Chloride: 105 mmol/L (ref 98–111)
GFR, Est AFR Am: >60 mL/min (ref >60)
GFR, Estimated: >60 mL/min (ref >60)
GLUCOSE: 113 mg/dL — AB (ref 70–99)
Potassium: 3.5 mmol/L (ref 3.5–5.1)
SODIUM: 142 mmol/L (ref 135–145)
Total Bilirubin: 0.5 mg/dL (ref 0.3–1.2)
Total Protein: 6.9 g/dL (ref 6.5–8.1)

## 2018-06-08 LAB — CBC WITH DIFFERENTIAL (CANCER CENTER ONLY)
ABS IMMATURE GRANULOCYTES: 0.02 10*3/uL (ref 0.00–0.07)
BASOS PCT: 1 %
Basophils Absolute: 0 10*3/uL (ref 0.0–0.1)
EOS ABS: 0.2 10*3/uL (ref 0.0–0.5)
Eosinophils Relative: 3 %
HCT: 34.2 % — ABNORMAL LOW (ref 36.0–46.0)
Hemoglobin: 11.4 g/dL — ABNORMAL LOW (ref 12.0–15.0)
IMMATURE GRANULOCYTES: 0 %
Lymphocytes Relative: 19 %
Lymphs Abs: 1.1 10*3/uL (ref 0.7–4.0)
MCH: 28.7 pg (ref 26.0–34.0)
MCHC: 33.3 g/dL (ref 30.0–36.0)
MCV: 86.1 fL (ref 80.0–100.0)
MONOS PCT: 8 %
Monocytes Absolute: 0.5 10*3/uL (ref 0.1–1.0)
NEUTROS PCT: 69 %
Neutro Abs: 3.9 10*3/uL (ref 1.7–7.7)
Platelet Count: 177 10*3/uL (ref 150–400)
RBC: 3.97 MIL/uL (ref 3.87–5.11)
RDW: 12.9 % (ref 11.5–15.5)
WBC Count: 5.7 10*3/uL (ref 4.0–10.5)
nRBC: 0 % (ref 0.0–0.2)

## 2018-06-08 MED ORDER — SODIUM CHLORIDE 0.9% FLUSH
10.0000 mL | Freq: Once | INTRAVENOUS | Status: AC
  Administered 2018-06-08: 10 mL
  Filled 2018-06-08: qty 10

## 2018-06-08 MED ORDER — HEPARIN SOD (PORK) LOCK FLUSH 100 UNIT/ML IV SOLN
500.0000 [IU] | Freq: Once | INTRAVENOUS | Status: AC | PRN
  Administered 2018-06-08: 500 [IU]
  Filled 2018-06-08: qty 5

## 2018-06-08 MED ORDER — SODIUM CHLORIDE 0.9 % IV SOLN
Freq: Once | INTRAVENOUS | Status: AC
  Administered 2018-06-08: 16:00:00 via INTRAVENOUS
  Filled 2018-06-08: qty 250

## 2018-06-08 MED ORDER — SODIUM CHLORIDE 0.9% FLUSH
10.0000 mL | INTRAVENOUS | Status: DC | PRN
  Administered 2018-06-08: 10 mL
  Filled 2018-06-08: qty 10

## 2018-06-08 MED ORDER — SODIUM CHLORIDE 0.9 % IV SOLN
200.0000 mg | Freq: Once | INTRAVENOUS | Status: AC
  Administered 2018-06-08: 200 mg via INTRAVENOUS
  Filled 2018-06-08: qty 8

## 2018-06-08 NOTE — Assessment & Plan Note (Signed)
Her pain is well controlled She will continue morphine sulfate daily with plan for eventual taper of long-acting morphine sulfate

## 2018-06-08 NOTE — Telephone Encounter (Signed)
Gave avs and calendar ° °

## 2018-06-08 NOTE — Patient Instructions (Signed)
[facility] Discharge Instructions for Patients Receiving Chemotherapy  Today you received the following chemotherapy agents:  Keytruda (pembrolizumab)  To help prevent nausea and vomiting after your treatment, we encourage you to take your nausea medication as prescribed.   If you develop nausea and vomiting that is not controlled by your nausea medication, call the clinic.   BELOW ARE SYMPTOMS THAT SHOULD BE REPORTED IMMEDIATELY:  *FEVER GREATER THAN 100.5 F  *CHILLS WITH OR WITHOUT FEVER  NAUSEA AND VOMITING THAT IS NOT CONTROLLED WITH YOUR NAUSEA MEDICATION  *UNUSUAL SHORTNESS OF BREATH  *UNUSUAL BRUISING OR BLEEDING  TENDERNESS IN MOUTH AND THROAT WITH OR WITHOUT PRESENCE OF ULCERS  *URINARY PROBLEMS  *BOWEL PROBLEMS  UNUSUAL RASH Items with * indicate a potential emergency and should be followed up as soon as possible.  Feel free to call the clinic should you have any questions or concerns. The clinic phone number is [phone].  Please show the CHEMO ALERT CARD at check-in to the Emergency Department and triage nurse.   

## 2018-06-08 NOTE — Assessment & Plan Note (Signed)
She has intermittent stable anemia.  Will monitor closely.

## 2018-06-08 NOTE — Assessment & Plan Note (Signed)
Her recent TSH is satisfactory She will continue chronic thyroid replacement therapy

## 2018-06-08 NOTE — Assessment & Plan Note (Signed)
She tolerated recent treatment very well Last CT imaging show excellent response to therapy We will continue treatment as scheduled I plan to monitor another CT imaging in 4 months, due around March 2020

## 2018-06-08 NOTE — Progress Notes (Signed)
[place] OFFICE PROGRESS NOTE  Patient Care Team: [name], MD as PCP - General (Family Medicine)  ASSESSMENT & PLAN:  Recurrent carcinoma of endometrium [facility]) She tolerated recent treatment very well Last CT imaging show excellent response to therapy We will continue treatment as scheduled I plan to monitor another CT imaging in 4 months, due around March 2020  Acquired hypothyroidism Her recent TSH is satisfactory She will continue chronic thyroid replacement therapy   Anemia due to antineoplastic chemotherapy She has intermittent stable anemia.  Will monitor closely.  Cancer associated pain Her pain is well controlled She will continue morphine sulfate daily with plan for eventual taper of long-acting morphine sulfate   Orders Placed This Encounter  Procedures  . TSH    Standing Status:   Standing    Number of Occurrences:   22    Standing Expiration Date:   06/09/2019    INTERVAL HISTORY: Please see below for problem oriented charting. She returns for further follow-up with her sister She is doing well Her recent imaging studies show positive response to therapy No recent bleeding Denies abdominal discomfort, nausea or changes in bowel habits She has not needed to take much pain medicine at all since her last time I saw her  SUMMARY OF ONCOLOGIC HISTORY: Oncology History   MSI stable on June 2017 tissue but high from [facility] study from November 2017  05/15/16: ER is moderately positive (70%). PR is strongly positive (80%).     Recurrent carcinoma of endometrium ([place])   01/02/2016 Pathology Results    Uterus +/- tubes/ovaries, neoplastic, cervix ENDOMETRIAL ADENOCARCINOMA, FIGO GRADE 2 (4.7 CM) THE TUMOR INVADES LESS THAN ONE-HALF OF THE MYOMETRIUM (PT1A) ALL MARGINS OF RESECTION ARE NEGATIVE FOR CARCINOMA LEIOMYOMAS AND ADENOMYOSIS BILATERAL FALLOPIAN TUBES AND OVARIES: HISTOLOGICAL UNREMARKABLE 2. Lymph node, sentinel, biopsy,  right obturator ONE BENIGN LYMPH NODE (0/1) 3. Lymph nodes, regional resection, left pelvic FOUR BENIGN LYMPH NODES (0/4)    01/02/2016 Surgery    Dr. Denman George performed robotic-assisted laparoscopic total hysterectomy with bilateral salpingoophorectomy, sentinel lymph node biopsy, lymphadenectomy      05/15/2016 Pathology Results    Vagina, biopsy, mid - ADENOCARCINOMA, SEE COMMENT. Microscopic Comment The morphology along with the patient's history are consistent with recurrent endometrioid adenocarcinoma. The carcinoma has a similar appearance to the primary ([id number]).    05/20/2016 Imaging    Ct scan abdomen showed solid 2.5 cm peritoneal mass in the mid to anterior left pelvis, suspicious for peritoneal metastasis. 2. Small expansile low-attenuation filling defect in the left external iliac vein, cannot exclude a small deep venous thrombus. Consider correlation with left lower extremity venous Doppler scan. 3. Small simple fluid density structure in the left pelvic sidewall abutting the left external iliac vessels, favor a small postoperative seroma. 4. No ascites. 5. No lymphadenopathy.  No metastatic disease in the chest. 6. Aortic atherosclerosis.    06/12/2016 PET scan    Intensely hypermetabolic 2.1 cm central pelvic peritoneal mass just to the left of midline, consistent with peritoneal metastatic recurrence. No ascites. 2. No additional hypermetabolic sites of metastatic disease. 3. Diffuse thyroid hypermetabolism without discrete thyroid nodule, favoring thyroiditis. Recommend correlation with serum thyroid function tests.    06/24/2016 - 07/22/2016 Chemotherapy    The patient had weekly cisplatin. She has missed several doses due to infection and pancytopenia    06/24/2016 - 09/04/2016 Radiation Therapy    She completed concurrent radiation therapy Radiation treatment dates:   IMRT :  06/24/16 - 08/01/16 HDR : 08/13/16, 08/20/16, 08/27/16, 09/04/16  Site/dose:   Pelvis treated to  55 Gy in 25 fractions (simultaneous integrated boost technique) Vaginal Cuff treated to 24 Gy in 4 fractions    08/15/2016 - 12/03/2016 Chemotherapy    She received 6 cycles of carboplatin/Taxol    09/27/2016 Imaging    Interval decrease in size of previously described solid peritoneal nodule within the left anterior pelvis. Near complete resolution of previously described low-attenuation structure along the left pelvic sidewall. No evidence for metastatic disease in the chest. Aortic atherosclerosis.    12/30/2016 Imaging    Ct abdomen 1. Solitary left pelvic peritoneal implant is mildly decreased in size in the interval. 2. No new or progressive metastatic disease in the abdomen or pelvis. No ascites. 3. Aortic atherosclerosis.    04/02/2017 Imaging    Left lower quadrant peritoneal implant referenced on previous exam measures 1.7 x 1.3 cm, image 69 of series 2. Increased from 0.8 x 0.8 cm previously peer no new peritoneal implants identified.  Musculoskeletal: The degenerative disc disease noted within the lumbar spine.  IMPRESSION: 1. Solitary left pelvic peritoneal implant is increased in size in the interval. 2. No new sites of disease.  No ascites. 3. Aortic atherosclerosis    04/11/2017 PET scan    1. The left side of pelvis peritoneal implant has decreased in size and degree of FDG uptake compatible with response to therapy. No new areas of peritoneal disease identified. 2. Persistent diffuse increased uptake within the thyroid gland. Correlation with patient's thyroid function may be helpful.    05/26/2017 Imaging    1. Enlarging tumor implant along the left adnexa, currently 2.6 by 2.4 cm and previously 1.9 by 1.3 cm. No new tumor implant or other specific cause for the patient's pelvic symptoms is currently identified. 2.  Aortic Atherosclerosis (ICD10-I70.0). 3. Lumbar spondylosis and degenerative disc disease causing multilevel impingement.    06/04/2017 - 08/28/2017  Chemotherapy    The patient started letrozole and everolimus    08/26/2017 Imaging    Increased size of mass in the left adnexal region.  Several new small liver metastases in right hepatic lobe    09/08/2017 Imaging    LV EF: 60% -  65%    09/10/2017 Procedure    Technically successful right IJ power-injectable port catheter placement. Ready for routine use    12/04/2017 Imaging    1. Interval increase in size and number of multiple lesions within the liver compatible with hepatic metastatic disease. 2. Interval increase in size mass within the left hemipelvis.    12/15/2017 -  Chemotherapy    The patient had pembrolizumab (KEYTRUDA) 200 mg in sodium chloride 0.9 % 50 mL chemo infusion, 200 mg, Intravenous, Once, 1 of 6 cycles Administration: 200 mg (12/15/2017)  for chemotherapy treatment.     12/21/2017 Imaging    1. Moderate left hydronephrosis and hydroureter, similar compared to most recent CT from May 2019; obstruction appears to be secondary to a left pelvic mass/metastatic focus which has increased in size since the prior CT. 2. Numerous hepatic metastatic lesions, suspect that some may be increased in size but difficult to further characterize without intravenous contrast. Increased size of left pelvic soft tissue mass/metastatic lesion. Mass abuts and possibly invades the sigmoid colon.     02/20/2018 Imaging    Interval decrease in hepatic metastases.  Decreased mass or lymphadenopathy in the left external iliac chain. Interval resolution of left hydroureteronephrosis.  No new  or progressive disease within the abdomen or pelvis.    05/16/2018 Imaging    05/16/2018 CT Abdomen IMPRESSION: 1. Slight interval decrease in size of hepatic metastatic disease. 2. Slight interval decrease in size centrally necrotic mass within the left external iliac region.     REVIEW OF SYSTEMS:   Constitutional: Denies fevers, chills or abnormal weight loss Eyes: Denies blurriness of  vision Ears, nose, mouth, throat, and face: Denies mucositis or sore throat Respiratory: Denies cough, dyspnea or wheezes Cardiovascular: Denies palpitation, chest discomfort or lower extremity swelling Gastrointestinal:  Denies nausea, heartburn or change in bowel habits Skin: Denies abnormal skin rashes Lymphatics: Denies new lymphadenopathy or easy bruising Neurological:Denies numbness, tingling or new weaknesses Behavioral/Psych: Mood is stable, no new changes  All other systems were reviewed with the patient and are negative.  I have reviewed the past medical history, past surgical history, social history and family history with the patient and they are unchanged from previous note.  ALLERGIES:  is allergic to latex.  MEDICATIONS:  Current Outpatient Medications  Medication Sig Dispense Refill  . acetaminophen (TYLENOL) 325 MG tablet Take 650 mg by mouth every 6 (six) hours as needed for mild pain or moderate pain.     Marland Kitchen amoxicillin (AMOXIL) 500 MG capsule Take 2,000 mg by mouth See admin instructions. Prior to dental appointment, takes 4 tables prior to appt    . Biotin w/ Vitamins C & E (HAIR/SKIN/NAILS PO) Take 1 tablet by mouth daily.    . Calcium Carb-Cholecalciferol (CALCIUM 600 + D PO) Take 2 tablets by mouth daily.    . celecoxib (CELEBREX) 200 MG capsule Take 1 capsule twice a day with food as needed for chest wall pain.    Marland Kitchen dronabinol (MARINOL) 2.5 MG capsule Take 1 capsule (2.5 mg total) by mouth 2 (two) times daily before lunch and supper. 60 capsule 0  . fluticasone (FLONASE) 50 MCG/ACT nasal spray Place 1 spray into both nostrils 2 (two) times daily as needed for allergies.   1  . gabapentin (NEURONTIN) 600 MG tablet TAKE 1 TABLET BY MOUTH TWICE A DAY (Patient taking differently: Take 1 tablet 2 times a day) 60 tablet 9  . HYDROmorphone (DILAUDID) 4 MG tablet Take 1 tablet (4 mg total) by mouth every 4 (four) hours as needed for severe pain. 60 tablet 0  . ibuprofen  (ADVIL,MOTRIN) 200 MG tablet Take 400 mg by mouth every 8 (eight) hours as needed for mild pain.    Marland Kitchen levothyroxine (SYNTHROID, LEVOTHROID) 112 MCG tablet Take 1 tablet (112 mcg total) by mouth daily. 30 tablet 9  . loratadine (CLARITIN) 10 MG tablet Take 10 mg by mouth daily.    Marland Kitchen LORazepam (ATIVAN) 1 MG tablet TAKE 1 TABLET BY MOUTH EVERY 8 HOURS AS NEEDED FOR ANXIETY OR SLEEP 90 tablet 0  . Magnesium 250 MG TABS Take 250 mg by mouth daily.    . Melatonin 10 MG CAPS Take 10 mg by mouth at bedtime.    Marland Kitchen morphine (MS CONTIN) 15 MG 12 hr tablet Take 1 tablet (15 mg total) by mouth every 12 (twelve) hours. 60 tablet 0  . ondansetron (ZOFRAN) 8 MG tablet Take 1 tablet (8 mg total) by mouth every 8 (eight) hours as needed for nausea. 60 tablet 1  . traZODone (DESYREL) 50 MG tablet Take 1.5 tablets (75 mg total) by mouth at bedtime. (Patient taking differently: Take 50 mg by mouth at bedtime. ) 135 tablet 1   No  current facility-administered medications for this visit.    Facility-Administered Medications Ordered in Other Visits  Medication Dose Route Frequency Provider Last Rate Last Dose  . heparin lock flush 100 unit/mL  500 Units Intracatheter Once PRN Alvy Bimler, Zanae Kuehnle, MD      . pembrolizumab (KEYTRUDA) 200 mg in sodium chloride 0.9 % 50 mL chemo infusion  200 mg Intravenous Once Rily Nickey, MD      . sodium chloride flush (NS) 0.9 % injection 10 mL  10 mL Intracatheter PRN Alvy Bimler, Lundynn Cohoon, MD        PHYSICAL EXAMINATION: ECOG PERFORMANCE STATUS: 1 - Symptomatic but completely ambulatory  Vitals:   06/08/18 1454  BP: 118/68  Pulse: 81  Resp: 18  Temp: 98.1 F (36.7 C)  SpO2: 100%   Filed Weights   06/08/18 1454  Weight: 191 lb 6.4 oz (86.8 kg)    GENERAL:alert, no distress and comfortable SKIN: skin color, texture, turgor are normal, no rashes or significant lesions EYES: normal, Conjunctiva are pink and non-injected, sclera clear OROPHARYNX:no exudate, no erythema and lips, buccal  mucosa, and tongue normal  NECK: supple, thyroid normal size, non-tender, without nodularity LYMPH:  no palpable lymphadenopathy in the cervical, axillary or inguinal LUNGS: clear to auscultation and percussion with normal breathing effort HEART: regular rate & rhythm and no murmurs and no lower extremity edema ABDOMEN:abdomen soft, non-tender and normal bowel sounds Musculoskeletal:no cyanosis of digits and no clubbing  NEURO: alert & oriented x 3 with fluent speech, no focal motor/sensory deficits  LABORATORY DATA:  I have reviewed the data as listed    Component Value Date/Time   NA 142 06/08/2018 1413   NA 141 07/09/2017 1312   K 3.5 06/08/2018 1413   K 3.9 07/09/2017 1312   CL 105 06/08/2018 1413   CO2 27 06/08/2018 1413   CO2 27 07/09/2017 1312   GLUCOSE 113 (H) 06/08/2018 1413   GLUCOSE 109 07/09/2017 1312   BUN 18 06/08/2018 1413   BUN 16.4 07/09/2017 1312   CREATININE 0.88 06/08/2018 1413   CREATININE 0.8 07/09/2017 1312   CALCIUM 9.4 06/08/2018 1413   CALCIUM 9.2 07/09/2017 1312   PROT 6.9 06/08/2018 1413   PROT 7.1 07/09/2017 1312   ALBUMIN 3.6 06/08/2018 1413   ALBUMIN 3.1 (L) 07/09/2017 1312   AST 17 06/08/2018 1413   AST 35 (H) 07/09/2017 1312   ALT 19 06/08/2018 1413   ALT 43 07/09/2017 1312   ALKPHOS 148 (H) 06/08/2018 1413   ALKPHOS 182 (H) 07/09/2017 1312   BILITOT 0.5 06/08/2018 1413   BILITOT 0.34 07/09/2017 1312   GFRNONAA >60 06/08/2018 1413   GFRAA >60 06/08/2018 1413    No results found for: SPEP, UPEP  Lab Results  Component Value Date   WBC 5.7 06/08/2018   NEUTROABS 3.9 06/08/2018   HGB 11.4 (L) 06/08/2018   HCT 34.2 (L) 06/08/2018   MCV 86.1 06/08/2018   PLT 177 06/08/2018      Chemistry      Component Value Date/Time   NA 142 06/08/2018 1413   NA 141 07/09/2017 1312   K 3.5 06/08/2018 1413   K 3.9 07/09/2017 1312   CL 105 06/08/2018 1413   CO2 27 06/08/2018 1413   CO2 27 07/09/2017 1312   BUN 18 06/08/2018 1413   BUN 16.4  07/09/2017 1312   CREATININE 0.88 06/08/2018 1413   CREATININE 0.8 07/09/2017 1312      Component Value Date/Time   CALCIUM 9.4 06/08/2018 1413  CALCIUM 9.2 07/09/2017 1312   ALKPHOS 148 (H) 06/08/2018 1413   ALKPHOS 182 (H) 07/09/2017 1312   AST 17 06/08/2018 1413   AST 35 (H) 07/09/2017 1312   ALT 19 06/08/2018 1413   ALT 43 07/09/2017 1312   BILITOT 0.5 06/08/2018 1413   BILITOT 0.34 07/09/2017 1312       RADIOGRAPHIC STUDIES: I have personally reviewed the radiological images as listed and agreed with the findings in the report. Ct Abdomen Pelvis W Contrast  Result Date: 05/16/2018 CLINICAL DATA:  Patient with history of metastatic endometrial carcinoma. Follow-up exam. EXAM: CT ABDOMEN AND PELVIS WITH CONTRAST TECHNIQUE: Multidetector CT imaging of the abdomen and pelvis was performed using the standard protocol following bolus administration of intravenous contrast. CONTRAST:  128m OMNIPAQUE IOHEXOL 300 MG/ML  SOLN COMPARISON:  CT abdomen pelvis 02/20/2018 FINDINGS: Lower chest: Normal heart size. Unchanged 3 mm right middle lobe nodule (image 20; series 7). Dependent atelectasis. No pleural effusion. 5 mm subpleural right lower lobe nodule (image 16; series 7), similar to prior. Hepatobiliary: Liver is normal in size and contour. Slight interval decrease in size of hepatic metastatic lesions. Reference lesion within the inferior right hepatic lobe measures 0.9 cm (image 36; series 2), previously 1.3 cm. Additional right hepatic lobe lesion measures 1.4 x 1.7 cm (image 32; series 2), previously 1.9 x 2.2 cm. Lesion adjacent to the IVC measures 1.6 cm (image 25; series 2), previously 2.0 cm. Gallbladder is unremarkable. No intrahepatic or extrahepatic biliary ductal dilatation. Pancreas: Unremarkable Spleen: Unremarkable Adrenals/Urinary Tract: Normal adrenal glands. Kidneys enhance symmetrically with contrast. No hydronephrosis. Urinary bladder is. Stomach/Bowel: Oral contrast material  throughout the small large bowel. No evidence for bowel obstruction or bowel wall thickening. Normal morphology of the stomach. No free fluid or free intraperitoneal air. Vascular/Lymphatic: Normal caliber abdominal aorta. Peripheral calcified atherosclerotic plaque. No retroperitoneal. Centrally necrotic mass along the left external iliac vessels (image 73; series 2), slightly decreased in size from prior measuring 4.1 x 3.2 cm, previously 4.4 x 3.5 cm. Reproductive: Status post hysterectomy. Other: None. Musculoskeletal: Lumbar spine degenerative changes. No aggressive or acute appearing osseous lesions. IMPRESSION: 1. Slight interval decrease in size of hepatic metastatic disease. 2. Slight interval decrease in size centrally necrotic mass within the left external iliac region. Electronically Signed   By: DLovey NewcomerM.D.   On: 05/16/2018 12:02    All questions were answered. The patient knows to call the clinic with any problems, questions or concerns. No barriers to learning was detected.  I spent 15 minutes counseling the patient face to face. The total time spent in the appointment was 20 minutes and more than 50% was on counseling and review of test results  NHeath Lark MD 06/08/2018 3:38 PM

## 2018-06-09 LAB — IRON AND TIBC
IRON: 107 ug/dL (ref 41–142)
Saturation Ratios: 42 % (ref 21–57)
TIBC: 256 ug/dL (ref 236–444)
UIBC: 149 ug/dL (ref 120–384)

## 2018-06-09 LAB — FERRITIN: FERRITIN: 292 ng/mL (ref 11–307)

## 2018-06-12 ENCOUNTER — Other Ambulatory Visit: Payer: Self-pay | Admitting: Hematology and Oncology

## 2018-06-29 ENCOUNTER — Inpatient Hospital Stay: Payer: Medicare Other

## 2018-06-29 ENCOUNTER — Encounter: Payer: Self-pay | Admitting: Hematology and Oncology

## 2018-06-29 ENCOUNTER — Inpatient Hospital Stay (HOSPITAL_BASED_OUTPATIENT_CLINIC_OR_DEPARTMENT_OTHER): Payer: Medicare Other | Admitting: Hematology and Oncology

## 2018-06-29 ENCOUNTER — Telehealth: Payer: Self-pay

## 2018-06-29 ENCOUNTER — Telehealth: Payer: Self-pay | Admitting: Hematology and Oncology

## 2018-06-29 ENCOUNTER — Other Ambulatory Visit: Payer: Self-pay | Admitting: Hematology and Oncology

## 2018-06-29 ENCOUNTER — Other Ambulatory Visit: Payer: Self-pay

## 2018-06-29 DIAGNOSIS — C541 Malignant neoplasm of endometrium: Secondary | ICD-10-CM | POA: Diagnosis not present

## 2018-06-29 DIAGNOSIS — E039 Hypothyroidism, unspecified: Secondary | ICD-10-CM

## 2018-06-29 DIAGNOSIS — D6481 Anemia due to antineoplastic chemotherapy: Secondary | ICD-10-CM | POA: Diagnosis not present

## 2018-06-29 DIAGNOSIS — Z5112 Encounter for antineoplastic immunotherapy: Secondary | ICD-10-CM | POA: Diagnosis not present

## 2018-06-29 DIAGNOSIS — T451X5A Adverse effect of antineoplastic and immunosuppressive drugs, initial encounter: Secondary | ICD-10-CM

## 2018-06-29 DIAGNOSIS — G620 Drug-induced polyneuropathy: Secondary | ICD-10-CM | POA: Diagnosis not present

## 2018-06-29 DIAGNOSIS — G893 Neoplasm related pain (acute) (chronic): Secondary | ICD-10-CM

## 2018-06-29 LAB — CBC WITH DIFFERENTIAL (CANCER CENTER ONLY)
Abs Immature Granulocytes: 0.02 10*3/uL (ref 0.00–0.07)
Basophils Absolute: 0.1 10*3/uL (ref 0.0–0.1)
Basophils Relative: 1 %
Eosinophils Absolute: 0.3 10*3/uL (ref 0.0–0.5)
Eosinophils Relative: 6 %
HCT: 33.7 % — ABNORMAL LOW (ref 36.0–46.0)
Hemoglobin: 11 g/dL — ABNORMAL LOW (ref 12.0–15.0)
Immature Granulocytes: 0 %
Lymphocytes Relative: 18 %
Lymphs Abs: 0.9 10*3/uL (ref 0.7–4.0)
MCH: 28.9 pg (ref 26.0–34.0)
MCHC: 32.6 g/dL (ref 30.0–36.0)
MCV: 88.5 fL (ref 80.0–100.0)
Monocytes Absolute: 0.4 10*3/uL (ref 0.1–1.0)
Monocytes Relative: 9 %
NEUTROS PCT: 66 %
Neutro Abs: 3.3 10*3/uL (ref 1.7–7.7)
Platelet Count: 184 10*3/uL (ref 150–400)
RBC: 3.81 MIL/uL — AB (ref 3.87–5.11)
RDW: 13.2 % (ref 11.5–15.5)
WBC: 5 10*3/uL (ref 4.0–10.5)
nRBC: 0 % (ref 0.0–0.2)

## 2018-06-29 LAB — CMP (CANCER CENTER ONLY)
ALT: 14 U/L (ref 0–44)
ANION GAP: 9 (ref 5–15)
AST: 16 U/L (ref 15–41)
Albumin: 3.5 g/dL (ref 3.5–5.0)
Alkaline Phosphatase: 137 U/L — ABNORMAL HIGH (ref 38–126)
BUN: 20 mg/dL (ref 8–23)
CO2: 27 mmol/L (ref 22–32)
Calcium: 9.1 mg/dL (ref 8.9–10.3)
Chloride: 106 mmol/L (ref 98–111)
Creatinine: 0.89 mg/dL (ref 0.44–1.00)
Glucose, Bld: 112 mg/dL — ABNORMAL HIGH (ref 70–99)
Potassium: 3.5 mmol/L (ref 3.5–5.1)
Sodium: 142 mmol/L (ref 135–145)
Total Bilirubin: 0.4 mg/dL (ref 0.3–1.2)
Total Protein: 6.6 g/dL (ref 6.5–8.1)

## 2018-06-29 LAB — TSH: TSH: 22.213 u[IU]/mL — ABNORMAL HIGH (ref 0.308–3.960)

## 2018-06-29 MED ORDER — SODIUM CHLORIDE 0.9 % IV SOLN
200.0000 mg | Freq: Once | INTRAVENOUS | Status: AC
  Administered 2018-06-29: 200 mg via INTRAVENOUS
  Filled 2018-06-29: qty 8

## 2018-06-29 MED ORDER — SODIUM CHLORIDE 0.9% FLUSH
10.0000 mL | Freq: Once | INTRAVENOUS | Status: AC
  Administered 2018-06-29: 10 mL
  Filled 2018-06-29: qty 10

## 2018-06-29 MED ORDER — LEVOTHYROXINE SODIUM 150 MCG PO TABS: 150.0000 ug | ORAL_TABLET | Freq: Every day | ORAL | 1 refills | Status: DC

## 2018-06-29 MED ORDER — SODIUM CHLORIDE 0.9 % IV SOLN
Freq: Once | INTRAVENOUS | Status: AC
  Administered 2018-06-29: 14:00:00 via INTRAVENOUS
  Filled 2018-06-29: qty 250

## 2018-06-29 MED ORDER — HEPARIN SOD (PORK) LOCK FLUSH 100 UNIT/ML IV SOLN
500.0000 [IU] | Freq: Once | INTRAVENOUS | Status: AC | PRN
  Administered 2018-06-29: 500 [IU]
  Filled 2018-06-29: qty 5

## 2018-06-29 MED ORDER — SODIUM CHLORIDE 0.9% FLUSH
10.0000 mL | INTRAVENOUS | Status: DC | PRN
  Administered 2018-06-29: 10 mL
  Filled 2018-06-29: qty 10

## 2018-06-29 NOTE — Assessment & Plan Note (Addendum)
She has intermittent stable anemia.  Will monitor closely. Her last iron study is within normal limits

## 2018-06-29 NOTE — Telephone Encounter (Signed)
Per 12/23 no new orders

## 2018-06-29 NOTE — Patient Instructions (Signed)
[facility] Discharge Instructions for Patients Receiving Chemotherapy  Today you received the following chemotherapy agents:  Keytruda (pembrolizumab)  To help prevent nausea and vomiting after your treatment, we encourage you to take your nausea medication as prescribed.   If you develop nausea and vomiting that is not controlled by your nausea medication, call the clinic.   BELOW ARE SYMPTOMS THAT SHOULD BE REPORTED IMMEDIATELY:  *FEVER GREATER THAN 100.5 F  *CHILLS WITH OR WITHOUT FEVER  NAUSEA AND VOMITING THAT IS NOT CONTROLLED WITH YOUR NAUSEA MEDICATION  *UNUSUAL SHORTNESS OF BREATH  *UNUSUAL BRUISING OR BLEEDING  TENDERNESS IN MOUTH AND THROAT WITH OR WITHOUT PRESENCE OF ULCERS  *URINARY PROBLEMS  *BOWEL PROBLEMS  UNUSUAL RASH Items with * indicate a potential emergency and should be followed up as soon as possible.  Feel free to call the clinic should you have any questions or concerns. The clinic phone number is [phone].  Please show the CHEMO ALERT CARD at check-in to the Emergency Department and triage nurse.   

## 2018-06-29 NOTE — Telephone Encounter (Signed)
Called and given below message. She verbalized understanding. Rx sent to [facility] in [facility].

## 2018-06-29 NOTE — Telephone Encounter (Signed)
-----   Message from Heath Lark, MD sent at 06/29/2018  2:15 PM EST ----- Regarding: increase thyroid medication Her TSH is high She needs to increase to 150 mcg Please call in 30 days supply to local pharmacy with 1 refill ----- Message ----- From: [place]: 06/29/2018  12:59 PM EST To: Heath Lark, MD

## 2018-06-29 NOTE — Progress Notes (Signed)
[place] OFFICE PROGRESS NOTE  Patient Care Team: [name], MD as PCP - General (Family Medicine)  ASSESSMENT & PLAN:  Recurrent carcinoma of endometrium [facility]) She tolerated recent treatment very well Last CT imaging show excellent response to therapy We will continue treatment as scheduled I plan to monitor another CT imaging in 4 months, due around March 2020  Cancer associated pain She denies pain.  She is currently on a weaning program of all her prescription pain medicine.  Acquired hypothyroidism Her recent TSH is satisfactory She will continue chronic thyroid replacement therapy We will adjust her thyroid medicine as needed  Anemia due to antineoplastic chemotherapy She has intermittent stable anemia.  Will monitor closely. Her last iron study is within normal limits   No orders of the defined types were placed in this encounter.   INTERVAL HISTORY: Please see below for problem oriented charting. She returns for further follow-up with her family She feels well She denies pain She tolerated treatment really well.  No recent abdominal bloating, nausea or changes in bowel habits The patient denies any recent signs or symptoms of bleeding such as spontaneous epistaxis, hematuria or hematochezia. 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 PR is strongly positive (80%).     Recurrent carcinoma of endometrium ([place])   01/02/2016 Pathology Results    Uterus +/- tubes/ovaries, neoplastic, cervix ENDOMETRIAL ADENOCARCINOMA, FIGO GRADE 2 (4.7 CM) THE TUMOR INVADES LESS THAN ONE-HALF OF THE MYOMETRIUM (PT1A) ALL MARGINS OF RESECTION ARE NEGATIVE FOR CARCINOMA LEIOMYOMAS AND ADENOMYOSIS BILATERAL FALLOPIAN TUBES AND OVARIES: HISTOLOGICAL UNREMARKABLE 2. Lymph  node, sentinel, biopsy, right obturator ONE BENIGN LYMPH NODE (0/1) 3. Lymph nodes, regional resection, left pelvic FOUR BENIGN LYMPH NODES (0/4)    01/02/2016 Surgery    Dr. Denman George performed robotic-assisted laparoscopic total hysterectomy with bilateral salpingoophorectomy, sentinel lymph node biopsy, lymphadenectomy      05/15/2016 Pathology Results    Vagina, biopsy, mid - ADENOCARCINOMA, SEE COMMENT. Microscopic Comment The morphology along with the patient's history are consistent with recurrent endometrioid adenocarcinoma. The carcinoma has a similar appearance to the primary ([id number]).    05/20/2016 Imaging    Ct scan abdomen showed solid 2.5 cm peritoneal mass in the mid to anterior left pelvis, suspicious for peritoneal metastasis. 2. Small expansile low-attenuation filling defect in the left external iliac vein, cannot exclude a small deep venous thrombus. Consider correlation with left lower extremity venous Doppler scan. 3. Small simple fluid density structure in the left pelvic sidewall abutting the left external iliac vessels, favor a small postoperative seroma. 4. No ascites. 5. No lymphadenopathy.  No metastatic disease in the chest. 6. Aortic atherosclerosis.    06/12/2016 PET scan    Intensely hypermetabolic 2.1 cm central pelvic peritoneal mass just to the left of midline, consistent with peritoneal metastatic recurrence. No ascites. 2. No additional hypermetabolic sites of metastatic disease. 3. Diffuse thyroid hypermetabolism without discrete thyroid nodule, favoring thyroiditis. Recommend correlation with serum thyroid function tests.    06/24/2016 - 07/22/2016 Chemotherapy    The patient had weekly cisplatin. She has missed several doses due to infection and pancytopenia    06/24/2016 - 09/04/2016 Radiation Therapy    She completed concurrent radiation therapy Radiation treatment dates:   IMRT : 06/24/16 - 08/01/16 HDR : 08/13/16, 08/20/16, 08/27/16,  09/04/16  Site/dose:   Pelvis treated to 55 Gy in 25 fractions (simultaneous integrated boost technique) Vaginal Cuff treated to 24 Gy in 4 fractions    08/15/2016 - 12/03/2016 Chemotherapy    She received 6 cycles of carboplatin/Taxol    09/27/2016 Imaging    Interval decrease in size of previously described solid peritoneal nodule within the left anterior pelvis. Near complete resolution of previously described low-attenuation structure along the left pelvic sidewall. No evidence for metastatic disease in the chest. Aortic atherosclerosis.    12/30/2016 Imaging    Ct abdomen 1. Solitary left pelvic peritoneal implant is mildly decreased in size in the interval. 2. No new or progressive metastatic disease in the abdomen or pelvis. No ascites. 3. Aortic atherosclerosis.    04/02/2017 Imaging    Left lower quadrant peritoneal implant referenced on previous exam measures 1.7 x 1.3 cm, image 69 of series 2. Increased from 0.8 x 0.8 cm previously peer no new peritoneal implants identified.  Musculoskeletal: The degenerative disc disease noted within the lumbar spine.  IMPRESSION: 1. Solitary left pelvic peritoneal implant is increased in size in the interval. 2. No new sites of disease.  No ascites. 3. Aortic atherosclerosis    04/11/2017 PET scan    1. The left side of pelvis peritoneal implant has decreased in size and degree of FDG uptake compatible with response to therapy. No new areas of peritoneal disease identified. 2. Persistent diffuse increased uptake within the thyroid gland. Correlation with patient's thyroid function may be helpful.    05/26/2017 Imaging    1. Enlarging tumor implant along the left adnexa, currently 2.6 by 2.4 cm and previously 1.9 by 1.3 cm. No new tumor implant or other specific cause for the patient's pelvic symptoms is currently identified. 2.  Aortic Atherosclerosis (ICD10-I70.0). 3. Lumbar spondylosis and degenerative disc disease causing multilevel  impingement.    06/04/2017 - 08/28/2017 Chemotherapy    The patient started letrozole and everolimus    08/26/2017 Imaging    Increased size of mass in the left adnexal region.  Several new small liver metastases in right hepatic lobe    09/08/2017 Imaging    LV EF: 60% -  65%    09/10/2017 Procedure    Technically successful right IJ power-injectable port catheter placement. Ready for routine use    12/04/2017 Imaging    1. Interval increase in size and number of multiple lesions within the liver compatible with hepatic metastatic disease. 2. Interval increase in size mass within the left hemipelvis.    12/15/2017 -  Chemotherapy    The patient had pembrolizumab (KEYTRUDA) 200 mg in sodium chloride 0.9 % 50 mL chemo infusion, 200 mg, Intravenous, Once, 1 of 6 cycles Administration: 200 mg (12/15/2017)  for chemotherapy treatment.     12/21/2017 Imaging    1. Moderate left hydronephrosis and hydroureter, similar compared to most recent CT from May 2019; obstruction appears to be secondary to a left pelvic mass/metastatic focus which has increased in size since the prior CT. 2. Numerous hepatic metastatic lesions, suspect that some may be increased in size but difficult to further characterize without intravenous contrast. Increased size of left pelvic soft tissue mass/metastatic lesion. Mass abuts and possibly invades the sigmoid colon.     02/20/2018 Imaging    Interval decrease in hepatic metastases.  Decreased mass or lymphadenopathy in the left external iliac chain. Interval resolution of left hydroureteronephrosis.  No new or progressive disease within the abdomen or pelvis.  05/16/2018 Imaging    05/16/2018 CT Abdomen IMPRESSION: 1. Slight interval decrease in size of hepatic metastatic disease. 2. Slight interval decrease in size centrally necrotic mass within the left external iliac region.     REVIEW OF SYSTEMS:   Constitutional: Denies fevers, chills or abnormal weight  loss Eyes: Denies blurriness of vision Ears, nose, mouth, throat, and face: Denies mucositis or sore throat Respiratory: Denies cough, dyspnea or wheezes Cardiovascular: Denies palpitation, chest discomfort or lower extremity swelling Gastrointestinal:  Denies nausea, heartburn or change in bowel habits Skin: Denies abnormal skin rashes Lymphatics: Denies new lymphadenopathy or easy bruising Neurological:Denies numbness, tingling or new weaknesses Behavioral/Psych: Mood is stable, no new changes  All other systems were reviewed with the patient and are negative.  I have reviewed the past medical history, past surgical history, social history and family history with the patient and they are unchanged from previous note.  ALLERGIES:  is allergic to latex.  MEDICATIONS:  Current Outpatient Medications  Medication Sig Dispense Refill  . acetaminophen (TYLENOL) 325 MG tablet Take 650 mg by mouth every 6 (six) hours as needed for mild pain or moderate pain.     Marland Kitchen amoxicillin (AMOXIL) 500 MG capsule Take 2,000 mg by mouth See admin instructions. Prior to dental appointment, takes 4 tables prior to appt    . Biotin w/ Vitamins C & E (HAIR/SKIN/NAILS PO) Take 1 tablet by mouth daily.    . Calcium Carb-Cholecalciferol (CALCIUM 600 + D PO) Take 2 tablets by mouth daily.    . celecoxib (CELEBREX) 200 MG capsule Take 1 capsule twice a day with food as needed for chest wall pain.    . fluticasone (FLONASE) 50 MCG/ACT nasal spray Place 1 spray into both nostrils 2 (two) times daily as needed for allergies.   1  . gabapentin (NEURONTIN) 600 MG tablet TAKE 1 TABLET BY MOUTH TWICE A DAY (Patient taking differently: Take 1 tablet 2 times a day) 60 tablet 9  . HYDROmorphone (DILAUDID) 4 MG tablet Take 1 tablet (4 mg total) by mouth every 4 (four) hours as needed for severe pain. 60 tablet 0  . ibuprofen (ADVIL,MOTRIN) 200 MG tablet Take 400 mg by mouth every 8 (eight) hours as needed for mild pain.    Marland Kitchen  levothyroxine (SYNTHROID, LEVOTHROID) 112 MCG tablet Take 1 tablet (112 mcg total) by mouth daily. 30 tablet 9  . loratadine (CLARITIN) 10 MG tablet Take 10 mg by mouth daily.    Marland Kitchen LORazepam (ATIVAN) 1 MG tablet TAKE 1 TABLET BY MOUTH EVERY 8 HOURS AS NEEDED FOR ANXIETY OR SLEEP 90 tablet 0  . Magnesium 250 MG TABS Take 250 mg by mouth daily.    . Melatonin 10 MG CAPS Take 10 mg by mouth at bedtime.    . ondansetron (ZOFRAN) 8 MG tablet Take 1 tablet (8 mg total) by mouth every 8 (eight) hours as needed for nausea. 60 tablet 1  . traZODone (DESYREL) 50 MG tablet Take 1.5 tablets (75 mg total) by mouth at bedtime. (Patient taking differently: Take 50 mg by mouth at bedtime. ) 135 tablet 1   No current facility-administered medications for this visit.    Facility-Administered Medications Ordered in Other Visits  Medication Dose Route Frequency Provider Last Rate Last Dose  . heparin lock flush 100 unit/mL  500 Units Intracatheter Once PRN Alvy Bimler, Illya Gienger, MD      . pembrolizumab (KEYTRUDA) 200 mg in sodium chloride 0.9 % 50 mL chemo infusion  200  mg Intravenous Once Alvy Bimler, Pricila Bridge, MD      . sodium chloride flush (NS) 0.9 % injection 10 mL  10 mL Intracatheter PRN Alvy Bimler, Lurlean Kernen, MD        PHYSICAL EXAMINATION: ECOG PERFORMANCE STATUS: 1 - Symptomatic but completely ambulatory  Vitals:   06/29/18 1314  BP: (!) 123/95  Pulse: 70  Resp: 18  Temp: (!) 97.3 F (36.3 C)  SpO2: 98%   Filed Weights   06/29/18 1314  Weight: 195 lb 3.2 oz (88.5 kg)    GENERAL:alert, no distress and comfortable SKIN: skin color, texture, turgor are normal, no rashes or significant lesions EYES: normal, Conjunctiva are pink and non-injected, sclera clear OROPHARYNX:no exudate, no erythema and lips, buccal mucosa, and tongue normal  NECK: supple, thyroid normal size, non-tender, without nodularity LYMPH:  no palpable lymphadenopathy in the cervical, axillary or inguinal LUNGS: clear to auscultation and percussion  with normal breathing effort HEART: regular rate & rhythm and no murmurs and no lower extremity edema ABDOMEN:abdomen soft, non-tender and normal bowel sounds Musculoskeletal:no cyanosis of digits and no clubbing  NEURO: alert & oriented x 3 with fluent speech, no focal motor/sensory deficits  LABORATORY DATA:  I have reviewed the data as listed    Component Value Date/Time   NA 142 06/29/2018 1234   NA 141 07/09/2017 1312   K 3.5 06/29/2018 1234   K 3.9 07/09/2017 1312   CL 106 06/29/2018 1234   CO2 27 06/29/2018 1234   CO2 27 07/09/2017 1312   GLUCOSE 112 (H) 06/29/2018 1234   GLUCOSE 109 07/09/2017 1312   BUN 20 06/29/2018 1234   BUN 16.4 07/09/2017 1312   CREATININE 0.89 06/29/2018 1234   CREATININE 0.8 07/09/2017 1312   CALCIUM 9.1 06/29/2018 1234   CALCIUM 9.2 07/09/2017 1312   PROT 6.6 06/29/2018 1234   PROT 7.1 07/09/2017 1312   ALBUMIN 3.5 06/29/2018 1234   ALBUMIN 3.1 (L) 07/09/2017 1312   AST 16 06/29/2018 1234   AST 35 (H) 07/09/2017 1312   ALT 14 06/29/2018 1234   ALT 43 07/09/2017 1312   ALKPHOS 137 (H) 06/29/2018 1234   ALKPHOS 182 (H) 07/09/2017 1312   BILITOT 0.4 06/29/2018 1234   BILITOT 0.34 07/09/2017 1312   GFRNONAA >60 06/29/2018 1234   GFRAA >60 06/29/2018 1234    No results found for: SPEP, UPEP  Lab Results  Component Value Date   WBC 5.0 06/29/2018   NEUTROABS 3.3 06/29/2018   HGB 11.0 (L) 06/29/2018   HCT 33.7 (L) 06/29/2018   MCV 88.5 06/29/2018   PLT 184 06/29/2018      Chemistry      Component Value Date/Time   NA 142 06/29/2018 1234   NA 141 07/09/2017 1312   K 3.5 06/29/2018 1234   K 3.9 07/09/2017 1312   CL 106 06/29/2018 1234   CO2 27 06/29/2018 1234   CO2 27 07/09/2017 1312   BUN 20 06/29/2018 1234   BUN 16.4 07/09/2017 1312   CREATININE 0.89 06/29/2018 1234   CREATININE 0.8 07/09/2017 1312      Component Value Date/Time   CALCIUM 9.1 06/29/2018 1234   CALCIUM 9.2 07/09/2017 1312   ALKPHOS 137 (H) 06/29/2018  1234   ALKPHOS 182 (H) 07/09/2017 1312   AST 16 06/29/2018 1234   AST 35 (H) 07/09/2017 1312   ALT 14 06/29/2018 1234   ALT 43 07/09/2017 1312   BILITOT 0.4 06/29/2018 1234   BILITOT 0.34 07/09/2017 1312  All questions were answered. The patient knows to call the clinic with any problems, questions or concerns. No barriers to learning was detected.  I spent 15 minutes counseling the patient face to face. The total time spent in the appointment was 20 minutes and more than 50% was on counseling and review of test results  Heath Lark, MD 06/29/2018 1:59 PM

## 2018-06-29 NOTE — Assessment & Plan Note (Signed)
She denies pain.  She is currently on a weaning program of all her prescription pain medicine.

## 2018-06-29 NOTE — Assessment & Plan Note (Signed)
She tolerated recent treatment very well Last CT imaging show excellent response to therapy We will continue treatment as scheduled I plan to monitor another CT imaging in 4 months, due around March 2020

## 2018-06-29 NOTE — Assessment & Plan Note (Signed)
Her recent TSH is satisfactory She will continue chronic thyroid replacement therapy We will adjust her thyroid medicine as needed 

## 2018-07-20 ENCOUNTER — Inpatient Hospital Stay: Payer: Medicare Other

## 2018-07-20 ENCOUNTER — Telehealth: Payer: Self-pay | Admitting: Hematology and Oncology

## 2018-07-20 ENCOUNTER — Inpatient Hospital Stay (HOSPITAL_BASED_OUTPATIENT_CLINIC_OR_DEPARTMENT_OTHER): Payer: Medicare Other | Admitting: Hematology and Oncology

## 2018-07-20 ENCOUNTER — Encounter: Payer: Self-pay | Admitting: Hematology and Oncology

## 2018-07-20 ENCOUNTER — Inpatient Hospital Stay: Payer: Medicare Other | Attending: Hematology and Oncology

## 2018-07-20 VITALS — BP 130/65 | HR 65 | Temp 98.2°F | Resp 17 | Ht 65.5 in | Wt 199.6 lb

## 2018-07-20 DIAGNOSIS — T451X5A Adverse effect of antineoplastic and immunosuppressive drugs, initial encounter: Secondary | ICD-10-CM

## 2018-07-20 DIAGNOSIS — D6481 Anemia due to antineoplastic chemotherapy: Secondary | ICD-10-CM

## 2018-07-20 DIAGNOSIS — E039 Hypothyroidism, unspecified: Secondary | ICD-10-CM | POA: Insufficient documentation

## 2018-07-20 DIAGNOSIS — Z5112 Encounter for antineoplastic immunotherapy: Secondary | ICD-10-CM | POA: Insufficient documentation

## 2018-07-20 DIAGNOSIS — Z95828 Presence of other vascular implants and grafts: Secondary | ICD-10-CM

## 2018-07-20 DIAGNOSIS — C541 Malignant neoplasm of endometrium: Secondary | ICD-10-CM

## 2018-07-20 DIAGNOSIS — C787 Secondary malignant neoplasm of liver and intrahepatic bile duct: Secondary | ICD-10-CM

## 2018-07-20 DIAGNOSIS — G2581 Restless legs syndrome: Secondary | ICD-10-CM | POA: Insufficient documentation

## 2018-07-20 DIAGNOSIS — Z79899 Other long term (current) drug therapy: Secondary | ICD-10-CM | POA: Insufficient documentation

## 2018-07-20 LAB — CBC WITH DIFFERENTIAL (CANCER CENTER ONLY)
Abs Immature Granulocytes: 0.03 10*3/uL (ref 0.00–0.07)
Basophils Absolute: 0 10*3/uL (ref 0.0–0.1)
Basophils Relative: 1 %
Eosinophils Absolute: 0.2 10*3/uL (ref 0.0–0.5)
Eosinophils Relative: 3 %
HCT: 33.6 % — ABNORMAL LOW (ref 36.0–46.0)
Hemoglobin: 11.2 g/dL — ABNORMAL LOW (ref 12.0–15.0)
Immature Granulocytes: 1 %
Lymphocytes Relative: 24 %
Lymphs Abs: 1.4 10*3/uL (ref 0.7–4.0)
MCH: 29.2 pg (ref 26.0–34.0)
MCHC: 33.3 g/dL (ref 30.0–36.0)
MCV: 87.7 fL (ref 80.0–100.0)
Monocytes Absolute: 0.4 10*3/uL (ref 0.1–1.0)
Monocytes Relative: 6 %
NEUTROS PCT: 65 %
Neutro Abs: 4.1 10*3/uL (ref 1.7–7.7)
Platelet Count: 169 10*3/uL (ref 150–400)
RBC: 3.83 MIL/uL — AB (ref 3.87–5.11)
RDW: 13.2 % (ref 11.5–15.5)
WBC Count: 6.1 10*3/uL (ref 4.0–10.5)
nRBC: 0 % (ref 0.0–0.2)

## 2018-07-20 LAB — CMP (CANCER CENTER ONLY)
ALT: 15 U/L (ref 0–44)
AST: 16 U/L (ref 15–41)
Albumin: 3.6 g/dL (ref 3.5–5.0)
Alkaline Phosphatase: 135 U/L — ABNORMAL HIGH (ref 38–126)
Anion gap: 6 (ref 5–15)
BUN: 25 mg/dL — ABNORMAL HIGH (ref 8–23)
CALCIUM: 9.3 mg/dL (ref 8.9–10.3)
CO2: 29 mmol/L (ref 22–32)
Chloride: 107 mmol/L (ref 98–111)
Creatinine: 0.77 mg/dL (ref 0.44–1.00)
GFR, Est AFR Am: >60 mL/min (ref >60)
Glucose, Bld: 99 mg/dL (ref 70–99)
Potassium: 3.8 mmol/L (ref 3.5–5.1)
Sodium: 142 mmol/L (ref 135–145)
Total Bilirubin: 0.4 mg/dL (ref 0.3–1.2)
Total Protein: 6.8 g/dL (ref 6.5–8.1)

## 2018-07-20 LAB — TSH: TSH: 12.761 u[IU]/mL — ABNORMAL HIGH (ref 0.308–3.960)

## 2018-07-20 MED ORDER — LOSARTAN POTASSIUM-HCTZ 100-12.5 MG PO TABS: 0.5000 | ORAL_TABLET | Freq: Every day | ORAL | 9 refills | Status: AC

## 2018-07-20 MED ORDER — SODIUM CHLORIDE 0.9 % IV SOLN
Freq: Once | INTRAVENOUS | Status: AC
  Administered 2018-07-20: 15:00:00 via INTRAVENOUS
  Filled 2018-07-20: qty 250

## 2018-07-20 MED ORDER — HEPARIN SOD (PORK) LOCK FLUSH 100 UNIT/ML IV SOLN
500.0000 [IU] | Freq: Once | INTRAVENOUS | Status: AC | PRN
  Administered 2018-07-20: 500 [IU]
  Filled 2018-07-20: qty 5

## 2018-07-20 MED ORDER — SODIUM CHLORIDE 0.9% FLUSH
10.0000 mL | INTRAVENOUS | Status: DC | PRN
  Administered 2018-07-20: 10 mL
  Filled 2018-07-20: qty 10

## 2018-07-20 MED ORDER — SODIUM CHLORIDE 0.9 % IV SOLN
200.0000 mg | Freq: Once | INTRAVENOUS | Status: AC
  Administered 2018-07-20: 200 mg via INTRAVENOUS
  Filled 2018-07-20: qty 8

## 2018-07-20 MED ORDER — SODIUM CHLORIDE 0.9% FLUSH
10.0000 mL | INTRAVENOUS | Status: DC | PRN
  Administered 2018-07-20: 10 mL via INTRAVENOUS
  Filled 2018-07-20: qty 10

## 2018-07-20 MED ORDER — MAGNESIUM OXIDE 400 (241.3 MG) MG PO TABS: 400.0000 mg | ORAL_TABLET | Freq: Two times a day (BID) | ORAL | 9 refills | Status: DC

## 2018-07-20 NOTE — Assessment & Plan Note (Signed)
She is not symptomatic.  Recent iron studies are adequate.  Observe only

## 2018-07-20 NOTE — Patient Instructions (Signed)
[facility] Discharge Instructions for Patients Receiving Chemotherapy  Today you received the following chemotherapy agents:  Keytruda.  To help prevent nausea and vomiting after your treatment, we encourage you to take your nausea medication as directed.   If you develop nausea and vomiting that is not controlled by your nausea medication, call the clinic.   BELOW ARE SYMPTOMS THAT SHOULD BE REPORTED IMMEDIATELY:  *FEVER GREATER THAN 100.5 F  *CHILLS WITH OR WITHOUT FEVER  NAUSEA AND VOMITING THAT IS NOT CONTROLLED WITH YOUR NAUSEA MEDICATION  *UNUSUAL SHORTNESS OF BREATH  *UNUSUAL BRUISING OR BLEEDING  TENDERNESS IN MOUTH AND THROAT WITH OR WITHOUT PRESENCE OF ULCERS  *URINARY PROBLEMS  *BOWEL PROBLEMS  UNUSUAL RASH Items with * indicate a potential emergency and should be followed up as soon as possible.  Feel free to call the clinic should you have any questions or concerns. The clinic phone number is [phone].  Please show the CHEMO ALERT CARD at check-in to the Emergency Department and triage nurse.    

## 2018-07-20 NOTE — Assessment & Plan Note (Signed)
Her recent TSH is satisfactory She will continue chronic thyroid replacement therapy We will adjust her thyroid medicine as needed 

## 2018-07-20 NOTE — Assessment & Plan Note (Signed)
She tolerated chemotherapy very well She is off all pain medicine We will continue immunotherapy and I plan to repeat imaging study in March

## 2018-07-20 NOTE — Assessment & Plan Note (Signed)
She has prescription for clonazepam, lorazepam and alprazolam I recommend discontinuation of all the other benzodiazepines except for lorazepam. I recommend magnesium supplement to help with restless leg

## 2018-07-20 NOTE — Telephone Encounter (Signed)
Gave avs and calendar ° °

## 2018-07-20 NOTE — Progress Notes (Signed)
[facility] OFFICE PROGRESS NOTE  Patient Care Team: [name], MD as PCP - General (Family Medicine)  ASSESSMENT & PLAN:  Recurrent carcinoma of endometrium [facility]) She tolerated chemotherapy very well She is off all pain medicine We will continue immunotherapy and I plan to repeat imaging study in March  Anemia due to antineoplastic chemotherapy She is not symptomatic.  Recent iron studies are adequate.  Observe only  Restless leg syndrome She has prescription for clonazepam, lorazepam and alprazolam I recommend discontinuation of all the other benzodiazepines except for lorazepam. I recommend magnesium supplement to help with restless leg  Acquired hypothyroidism Her recent TSH is satisfactory She will continue chronic thyroid replacement therapy We will adjust her thyroid medicine as needed   Orders Placed This Encounter  Procedures  . CT ABDOMEN PELVIS W CONTRAST    Standing Status:   Future    Standing Expiration Date:   07/21/2019    Order Specific Question:   If indicated for the ordered procedure, I authorize the administration of contrast media per Radiology protocol    Answer:   Yes    Order Specific Question:   Preferred imaging location?    Answer:   [facility]    Order Specific Question:   Radiology Contrast Protocol - do NOT remove file path    Answer:   [email or web address]    INTERVAL HISTORY: Please see below for problem oriented charting. She returns for chemotherapy and follow-up She is doing well She is off all pain medicine She complains of restless leg syndrome Denies abdominal pain, nausea or changes in bowel habits.  No bloating.  SUMMARY OF ONCOLOGIC HISTORY: Oncology History   MSI stable on June 2017 tissue but high from [facility] study from November 2017  05/15/16: ER is moderately positive (70%). PR is strongly positive (80%).     Recurrent carcinoma of endometrium ([place])   01/02/2016 Pathology Results    Uterus +/- tubes/ovaries, neoplastic, cervix ENDOMETRIAL ADENOCARCINOMA, FIGO GRADE 2 (4.7 CM) THE TUMOR INVADES LESS THAN ONE-HALF OF THE MYOMETRIUM (PT1A) ALL MARGINS OF RESECTION ARE NEGATIVE FOR CARCINOMA LEIOMYOMAS AND ADENOMYOSIS BILATERAL FALLOPIAN TUBES AND OVARIES: HISTOLOGICAL UNREMARKABLE 2. Lymph node, sentinel, biopsy, right obturator ONE BENIGN LYMPH NODE (0/1) 3. Lymph nodes, regional resection, left pelvic FOUR BENIGN LYMPH NODES (0/4)    01/02/2016 Surgery    Dr. Denman George performed robotic-assisted laparoscopic total hysterectomy with bilateral salpingoophorectomy, sentinel lymph node biopsy, lymphadenectomy      05/15/2016 Pathology Results    Vagina, biopsy, mid - ADENOCARCINOMA, SEE COMMENT. Microscopic Comment The morphology along with the patient's history are consistent with recurrent endometrioid adenocarcinoma. The carcinoma has a similar appearance to the primary ([id number]).    05/20/2016 Imaging    Ct scan abdomen showed solid 2.5 cm peritoneal mass in the mid to anterior left pelvis, suspicious for peritoneal metastasis. 2. Small expansile low-attenuation filling defect in the left external iliac vein, cannot exclude a small deep venous thrombus. Consider correlation with left lower extremity venous Doppler scan. 3. Small simple fluid density structure in the left pelvic sidewall abutting the left external iliac vessels, favor a small postoperative seroma. 4. No ascites. 5. No lymphadenopathy.  No metastatic disease in the chest. 6. Aortic atherosclerosis.    06/12/2016 PET scan    Intensely hypermetabolic 2.1 cm central pelvic peritoneal mass just to the left of midline, consistent with peritoneal metastatic recurrence. No ascites. 2. No additional hypermetabolic sites of metastatic disease. 3. Diffuse thyroid  hypermetabolism without discrete thyroid nodule, favoring thyroiditis. Recommend correlation with serum thyroid function  tests.    06/24/2016 - 07/22/2016 Chemotherapy    The patient had weekly cisplatin. She has missed several doses due to infection and pancytopenia    06/24/2016 - 09/04/2016 Radiation Therapy    She completed concurrent radiation therapy Radiation treatment dates:   IMRT : 06/24/16 - 08/01/16 HDR : 08/13/16, 08/20/16, 08/27/16, 09/04/16  Site/dose:   Pelvis treated to 55 Gy in 25 fractions (simultaneous integrated boost technique) Vaginal Cuff treated to 24 Gy in 4 fractions    08/15/2016 - 12/03/2016 Chemotherapy    She received 6 cycles of carboplatin/Taxol    09/27/2016 Imaging    Interval decrease in size of previously described solid peritoneal nodule within the left anterior pelvis. Near complete resolution of previously described low-attenuation structure along the left pelvic sidewall. No evidence for metastatic disease in the chest. Aortic atherosclerosis.    12/30/2016 Imaging    Ct abdomen 1. Solitary left pelvic peritoneal implant is mildly decreased in size in the interval. 2. No new or progressive metastatic disease in the abdomen or pelvis. No ascites. 3. Aortic atherosclerosis.    04/02/2017 Imaging    Left lower quadrant peritoneal implant referenced on previous exam measures 1.7 x 1.3 cm, image 69 of series 2. Increased from 0.8 x 0.8 cm previously peer no new peritoneal implants identified.  Musculoskeletal: The degenerative disc disease noted within the lumbar spine.  IMPRESSION: 1. Solitary left pelvic peritoneal implant is increased in size in the interval. 2. No new sites of disease.  No ascites. 3. Aortic atherosclerosis    04/11/2017 PET scan    1. The left side of pelvis peritoneal implant has decreased in size and degree of FDG uptake compatible with response to therapy. No new areas of peritoneal disease identified. 2. Persistent diffuse increased uptake within the thyroid gland. Correlation with patient's thyroid function may be helpful.    05/26/2017  Imaging    1. Enlarging tumor implant along the left adnexa, currently 2.6 by 2.4 cm and previously 1.9 by 1.3 cm. No new tumor implant or other specific cause for the patient's pelvic symptoms is currently identified. 2.  Aortic Atherosclerosis (ICD10-I70.0). 3. Lumbar spondylosis and degenerative disc disease causing multilevel impingement.    06/04/2017 - 08/28/2017 Chemotherapy    The patient started letrozole and everolimus    08/26/2017 Imaging    Increased size of mass in the left adnexal region.  Several new small liver metastases in right hepatic lobe    09/08/2017 Imaging    LV EF: 60% -  65%    09/10/2017 Procedure    Technically successful right IJ power-injectable port catheter placement. Ready for routine use    12/04/2017 Imaging    1. Interval increase in size and number of multiple lesions within the liver compatible with hepatic metastatic disease. 2. Interval increase in size mass within the left hemipelvis.    12/15/2017 -  Chemotherapy    The patient had pembrolizumab (KEYTRUDA) 200 mg in sodium chloride 0.9 % 50 mL chemo infusion, 200 mg, Intravenous, Once, 1 of 6 cycles Administration: 200 mg (12/15/2017)  for chemotherapy treatment.     12/21/2017 Imaging    1. Moderate left hydronephrosis and hydroureter, similar compared to most recent CT from May 2019; obstruction appears to be secondary to a left pelvic mass/metastatic focus which has increased in size since the prior CT. 2. Numerous hepatic metastatic lesions, suspect that some may  be increased in size but difficult to further characterize without intravenous contrast. Increased size of left pelvic soft tissue mass/metastatic lesion. Mass abuts and possibly invades the sigmoid colon.     02/20/2018 Imaging    Interval decrease in hepatic metastases.  Decreased mass or lymphadenopathy in the left external iliac chain. Interval resolution of left hydroureteronephrosis.  No new or progressive disease within  the abdomen or pelvis.    05/16/2018 Imaging    05/16/2018 CT Abdomen IMPRESSION: 1. Slight interval decrease in size of hepatic metastatic disease. 2. Slight interval decrease in size centrally necrotic mass within the left external iliac region.     REVIEW OF SYSTEMS:   Constitutional: Denies fevers, chills or abnormal weight loss Eyes: Denies blurriness of vision Ears, nose, mouth, throat, and face: Denies mucositis or sore throat Respiratory: Denies cough, dyspnea or wheezes Cardiovascular: Denies palpitation, chest discomfort or lower extremity swelling Gastrointestinal:  Denies nausea, heartburn or change in bowel habits Skin: Denies abnormal skin rashes Lymphatics: Denies new lymphadenopathy or easy bruising Neurological:Denies numbness, tingling or new weaknesses Behavioral/Psych: Mood is stable, no new changes  All other systems were reviewed with the patient and are negative.  I have reviewed the past medical history, past surgical history, social history and family history with the patient and they are unchanged from previous note.  ALLERGIES:  is allergic to latex.  MEDICATIONS:  Current Outpatient Medications  Medication Sig Dispense Refill  . losartan-hydrochlorothiazide (HYZAAR) 100-12.5 MG tablet Take 0.5 tablets by mouth daily. 60 tablet 9  . acetaminophen (TYLENOL) 325 MG tablet Take 650 mg by mouth every 6 (six) hours as needed for mild pain or moderate pain.     Marland Kitchen amoxicillin (AMOXIL) 500 MG capsule Take 2,000 mg by mouth See admin instructions. Prior to dental appointment, takes 4 tables prior to appt    . Biotin w/ Vitamins C & E (HAIR/SKIN/NAILS PO) Take 1 tablet by mouth daily.    . Calcium Carb-Cholecalciferol (CALCIUM 600 + D PO) Take 2 tablets by mouth daily.    . celecoxib (CELEBREX) 200 MG capsule Take 1 capsule twice a day with food as needed for chest wall pain.    . fluticasone (FLONASE) 50 MCG/ACT nasal spray Place 1 spray into both nostrils 2 (two)  times daily as needed for allergies.   1  . gabapentin (NEURONTIN) 600 MG tablet TAKE 1 TABLET BY MOUTH TWICE A DAY (Patient taking differently: Take 1 tablet 2 times a day) 60 tablet 9  . ibuprofen (ADVIL,MOTRIN) 200 MG tablet Take 400 mg by mouth every 8 (eight) hours as needed for mild pain.    Marland Kitchen levothyroxine (SYNTHROID) 150 MCG tablet Take 1 tablet (150 mcg total) by mouth daily before breakfast. 30 tablet 1  . loratadine (CLARITIN) 10 MG tablet Take 10 mg by mouth daily.    Marland Kitchen LORazepam (ATIVAN) 1 MG tablet TAKE 1 TABLET BY MOUTH EVERY 8 HOURS AS NEEDED FOR ANXIETY OR SLEEP 90 tablet 0  . Magnesium 250 MG TABS Take 250 mg by mouth daily.    . magnesium oxide (MAG-OX) 400 (241.3 Mg) MG tablet Take 1 tablet (400 mg total) by mouth 2 (two) times daily. 60 tablet 9  . Melatonin 10 MG CAPS Take 10 mg by mouth at bedtime.    . ondansetron (ZOFRAN) 8 MG tablet Take 1 tablet (8 mg total) by mouth every 8 (eight) hours as needed for nausea. 60 tablet 1  . traZODone (DESYREL) 50 MG tablet Take  1.5 tablets (75 mg total) by mouth at bedtime. (Patient taking differently: Take 50 mg by mouth at bedtime. ) 135 tablet 1   No current facility-administered medications for this visit.     PHYSICAL EXAMINATION: ECOG PERFORMANCE STATUS: 1 - Symptomatic but completely ambulatory  Vitals:   07/20/18 1439  BP: 130/65  Pulse: 65  Resp: 17  Temp: 98.2 F (36.8 C)  SpO2: 98%   Filed Weights   07/20/18 1439  Weight: 199 lb 9.6 oz (90.5 kg)    GENERAL:alert, no distress and comfortable SKIN: skin color, texture, turgor are normal, no rashes or significant lesions EYES: normal, Conjunctiva are pink and non-injected, sclera clear OROPHARYNX:no exudate, no erythema and lips, buccal mucosa, and tongue normal  NECK: supple, thyroid normal size, non-tender, without nodularity LYMPH:  no palpable lymphadenopathy in the cervical, axillary or inguinal LUNGS: clear to auscultation and percussion with normal  breathing effort HEART: regular rate & rhythm and no murmurs and no lower extremity edema ABDOMEN:abdomen soft, non-tender and normal bowel sounds Musculoskeletal:no cyanosis of digits and no clubbing  NEURO: alert & oriented x 3 with fluent speech, no focal motor/sensory deficits  LABORATORY DATA:  I have reviewed the data as listed    Component Value Date/Time   NA 142 07/20/2018 1356   NA 141 07/09/2017 1312   K 3.8 07/20/2018 1356   K 3.9 07/09/2017 1312   CL 107 07/20/2018 1356   CO2 29 07/20/2018 1356   CO2 27 07/09/2017 1312   GLUCOSE 99 07/20/2018 1356   GLUCOSE 109 07/09/2017 1312   BUN 25 (H) 07/20/2018 1356   BUN 16.4 07/09/2017 1312   CREATININE 0.77 07/20/2018 1356   CREATININE 0.8 07/09/2017 1312   CALCIUM 9.3 07/20/2018 1356   CALCIUM 9.2 07/09/2017 1312   PROT 6.8 07/20/2018 1356   PROT 7.1 07/09/2017 1312   ALBUMIN 3.6 07/20/2018 1356   ALBUMIN 3.1 (L) 07/09/2017 1312   AST 16 07/20/2018 1356   AST 35 (H) 07/09/2017 1312   ALT 15 07/20/2018 1356   ALT 43 07/09/2017 1312   ALKPHOS 135 (H) 07/20/2018 1356   ALKPHOS 182 (H) 07/09/2017 1312   BILITOT 0.4 07/20/2018 1356   BILITOT 0.34 07/09/2017 1312   GFRNONAA >60 07/20/2018 1356   GFRAA >60 07/20/2018 1356    No results found for: SPEP, UPEP  Lab Results  Component Value Date   WBC 6.1 07/20/2018   NEUTROABS 4.1 07/20/2018   HGB 11.2 (L) 07/20/2018   HCT 33.6 (L) 07/20/2018   MCV 87.7 07/20/2018   PLT 169 07/20/2018      Chemistry      Component Value Date/Time   NA 142 07/20/2018 1356   NA 141 07/09/2017 1312   K 3.8 07/20/2018 1356   K 3.9 07/09/2017 1312   CL 107 07/20/2018 1356   CO2 29 07/20/2018 1356   CO2 27 07/09/2017 1312   BUN 25 (H) 07/20/2018 1356   BUN 16.4 07/09/2017 1312   CREATININE 0.77 07/20/2018 1356   CREATININE 0.8 07/09/2017 1312      Component Value Date/Time   CALCIUM 9.3 07/20/2018 1356   CALCIUM 9.2 07/09/2017 1312   ALKPHOS 135 (H) 07/20/2018 1356    ALKPHOS 182 (H) 07/09/2017 1312   AST 16 07/20/2018 1356   AST 35 (H) 07/09/2017 1312   ALT 15 07/20/2018 1356   ALT 43 07/09/2017 1312   BILITOT 0.4 07/20/2018 1356   BILITOT 0.34 07/09/2017 1312  All questions were answered. The patient knows to call the clinic with any problems, questions or concerns. No barriers to learning was detected.  I spent 15 minutes counseling the patient face to face. The total time spent in the appointment was 20 minutes and more than 50% was on counseling and review of test results  Heath Lark, MD 07/20/2018 2:52 PM

## 2018-07-24 ENCOUNTER — Other Ambulatory Visit: Payer: Self-pay | Admitting: Hematology and Oncology

## 2018-07-25 IMAGING — CR DG CHEST 2V
2 series · 2 of 2 positions shown · non-contrast
Comparison: None.

CLINICAL DATA: Right-sided chest pain for 1 week

EXAM:
CHEST - 2 VIEW

[w chest pa]
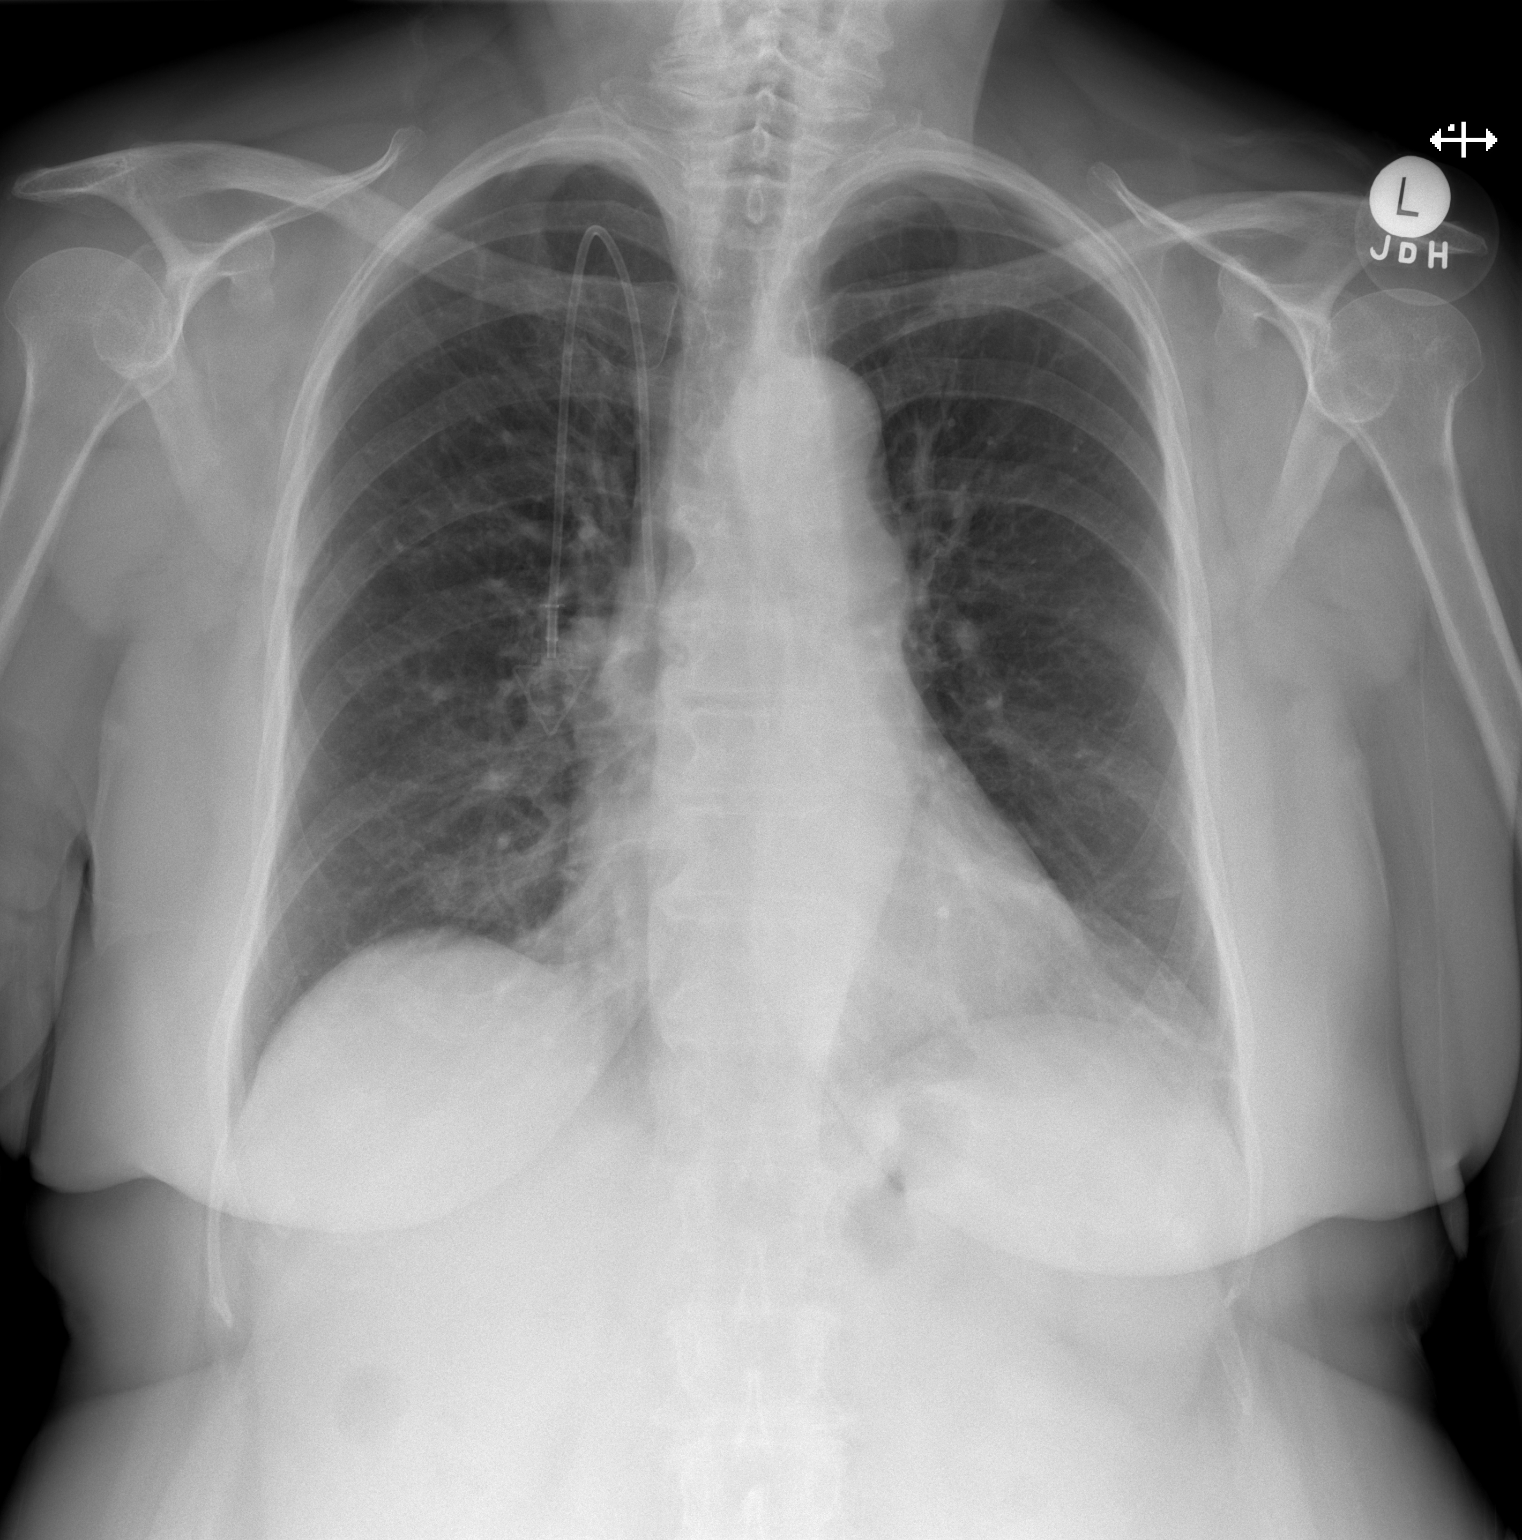

[w chest lat]
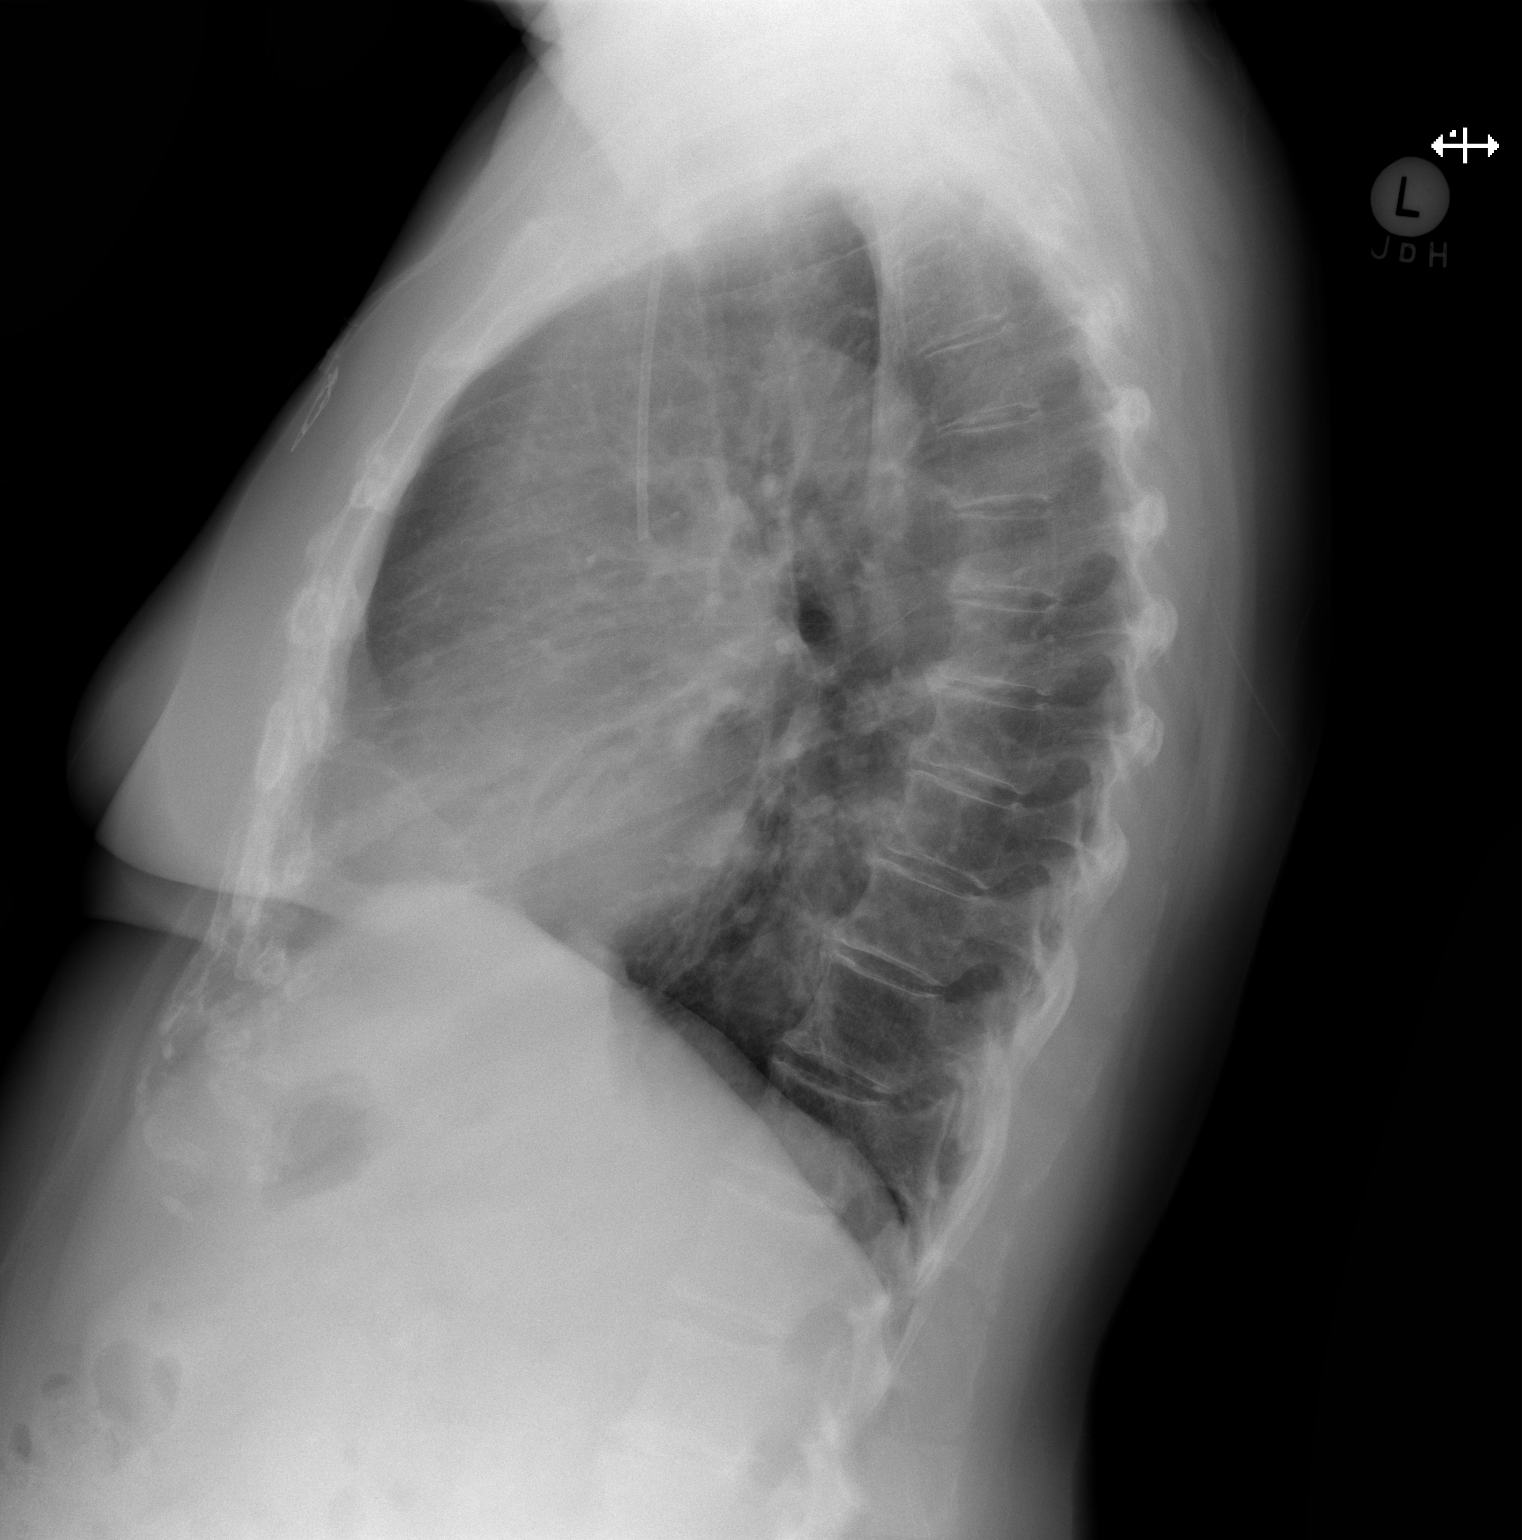

[2 of 2 positions shown; findings below may reference images not displayed]

FINDINGS: There is a right-sided Port-A-Cath in satisfactory position. There
is no focal parenchymal opacity. There is no pleural effusion or
pneumothorax. The heart and mediastinal contours are unremarkable.

The osseous structures are unremarkable.
IMPRESSION: No active cardiopulmonary disease.

## 2018-08-10 ENCOUNTER — Inpatient Hospital Stay: Payer: Medicare Other

## 2018-08-10 ENCOUNTER — Telehealth: Payer: Self-pay

## 2018-08-10 ENCOUNTER — Inpatient Hospital Stay: Payer: Medicare Other | Attending: Hematology and Oncology

## 2018-08-10 ENCOUNTER — Telehealth: Payer: Self-pay | Admitting: Hematology and Oncology

## 2018-08-10 ENCOUNTER — Inpatient Hospital Stay (HOSPITAL_BASED_OUTPATIENT_CLINIC_OR_DEPARTMENT_OTHER): Payer: Medicare Other | Admitting: Hematology and Oncology

## 2018-08-10 ENCOUNTER — Encounter: Payer: Self-pay | Admitting: Hematology and Oncology

## 2018-08-10 DIAGNOSIS — Z5112 Encounter for antineoplastic immunotherapy: Secondary | ICD-10-CM | POA: Diagnosis present

## 2018-08-10 DIAGNOSIS — E039 Hypothyroidism, unspecified: Secondary | ICD-10-CM

## 2018-08-10 DIAGNOSIS — D6481 Anemia due to antineoplastic chemotherapy: Secondary | ICD-10-CM | POA: Diagnosis not present

## 2018-08-10 DIAGNOSIS — C541 Malignant neoplasm of endometrium: Secondary | ICD-10-CM | POA: Insufficient documentation

## 2018-08-10 DIAGNOSIS — T451X5A Adverse effect of antineoplastic and immunosuppressive drugs, initial encounter: Secondary | ICD-10-CM

## 2018-08-10 DIAGNOSIS — G4700 Insomnia, unspecified: Secondary | ICD-10-CM | POA: Diagnosis not present

## 2018-08-10 DIAGNOSIS — G4709 Other insomnia: Secondary | ICD-10-CM | POA: Diagnosis not present

## 2018-08-10 DIAGNOSIS — G4701 Insomnia due to medical condition: Secondary | ICD-10-CM

## 2018-08-10 LAB — CBC WITH DIFFERENTIAL (CANCER CENTER ONLY)
Abs Immature Granulocytes: 0.02 10*3/uL (ref 0.00–0.07)
BASOS ABS: 0.1 10*3/uL (ref 0.0–0.1)
Basophils Relative: 1 %
Eosinophils Absolute: 0.3 10*3/uL (ref 0.0–0.5)
Eosinophils Relative: 6 %
HCT: 34.3 % — ABNORMAL LOW (ref 36.0–46.0)
Hemoglobin: 11.3 g/dL — ABNORMAL LOW (ref 12.0–15.0)
Immature Granulocytes: 0 %
Lymphocytes Relative: 21 %
Lymphs Abs: 1.2 10*3/uL (ref 0.7–4.0)
MCH: 29.1 pg (ref 26.0–34.0)
MCHC: 32.9 g/dL (ref 30.0–36.0)
MCV: 88.4 fL (ref 80.0–100.0)
Monocytes Absolute: 0.4 10*3/uL (ref 0.1–1.0)
Monocytes Relative: 6 %
NRBC: 0 % (ref 0.0–0.2)
Neutro Abs: 3.8 10*3/uL (ref 1.7–7.7)
Neutrophils Relative %: 66 %
Platelet Count: 173 10*3/uL (ref 150–400)
RBC: 3.88 MIL/uL (ref 3.87–5.11)
RDW: 13 % (ref 11.5–15.5)
WBC Count: 5.7 10*3/uL (ref 4.0–10.5)

## 2018-08-10 LAB — CMP (CANCER CENTER ONLY)
ALK PHOS: 137 U/L — AB (ref 38–126)
ALT: 11 U/L (ref 0–44)
AST: 15 U/L (ref 15–41)
Albumin: 3.6 g/dL (ref 3.5–5.0)
Anion gap: 8 (ref 5–15)
BUN: 29 mg/dL — ABNORMAL HIGH (ref 8–23)
CALCIUM: 9.2 mg/dL (ref 8.9–10.3)
CO2: 27 mmol/L (ref 22–32)
Chloride: 105 mmol/L (ref 98–111)
Creatinine: 0.85 mg/dL (ref 0.44–1.00)
GFR, Est AFR Am: >60 mL/min (ref >60)
GFR, Estimated: >60 mL/min (ref >60)
Glucose, Bld: 124 mg/dL — ABNORMAL HIGH (ref 70–99)
Potassium: 3.7 mmol/L (ref 3.5–5.1)
SODIUM: 140 mmol/L (ref 135–145)
Total Bilirubin: 0.4 mg/dL (ref 0.3–1.2)
Total Protein: 6.8 g/dL (ref 6.5–8.1)

## 2018-08-10 LAB — TSH: TSH: 8.973 u[IU]/mL — ABNORMAL HIGH (ref 0.308–3.960)

## 2018-08-10 MED ORDER — SODIUM CHLORIDE 0.9 % IV SOLN
200.0000 mg | Freq: Once | INTRAVENOUS | Status: AC
  Administered 2018-08-10: 200 mg via INTRAVENOUS
  Filled 2018-08-10: qty 8

## 2018-08-10 MED ORDER — HEPARIN SOD (PORK) LOCK FLUSH 100 UNIT/ML IV SOLN
500.0000 [IU] | Freq: Once | INTRAVENOUS | Status: AC | PRN
  Administered 2018-08-10: 500 [IU]
  Filled 2018-08-10: qty 5

## 2018-08-10 MED ORDER — SODIUM CHLORIDE 0.9 % IV SOLN
Freq: Once | INTRAVENOUS | Status: AC
  Administered 2018-08-10: 14:00:00 via INTRAVENOUS
  Filled 2018-08-10: qty 250

## 2018-08-10 MED ORDER — SODIUM CHLORIDE 0.9% FLUSH
10.0000 mL | Freq: Once | INTRAVENOUS | Status: AC
  Administered 2018-08-10: 10 mL
  Filled 2018-08-10: qty 10

## 2018-08-10 MED ORDER — SODIUM CHLORIDE 0.9% FLUSH
10.0000 mL | INTRAVENOUS | Status: DC | PRN
  Administered 2018-08-10: 10 mL
  Filled 2018-08-10: qty 10

## 2018-08-10 NOTE — Assessment & Plan Note (Signed)
She is not symptomatic.  Recent iron studies are adequate.  Observe only

## 2018-08-10 NOTE — Progress Notes (Signed)
[place] OFFICE PROGRESS NOTE  Patient Care Team: [name], MD as PCP - General (Family Medicine)  ASSESSMENT & PLAN:  Recurrent carcinoma of endometrium [facility]) She tolerated chemotherapy very well She is off all pain medicine We will continue immunotherapy and I plan to repeat imaging study in March  Acquired hypothyroidism Her recent TSH is satisfactory She will continue chronic thyroid replacement therapy We will adjust her thyroid medicine as needed  Anemia due to antineoplastic chemotherapy She is not symptomatic.  Recent iron studies are adequate.  Observe only  Insomnia disorder She has chronic insomnia and has taken benzodiazepines in the past to help with her sleep She will continue melatonin and trazodone and occasional benzodiazepine as needed   No orders of the defined types were placed in this encounter.   INTERVAL HISTORY: Please see below for problem oriented charting. She returns for further follow-up She tolerated recent treatment well except for recent insomnia Her restless legs are about the same The patient denies any recent signs or symptoms of bleeding such as spontaneous epistaxis, hematuria or hematochezia. Her energy level is fair She denies recent nausea, bloating or constipation.  She denies pain  SUMMARY OF ONCOLOGIC HISTORY: Oncology History   MSI stable on June 2017 tissue but high from [facility] study from November 2017  05/15/16: ER is moderately positive (70%). PR is strongly positive (80%).     Recurrent carcinoma of endometrium ([place])   01/02/2016 Pathology Results    Uterus +/- tubes/ovaries, neoplastic, cervix ENDOMETRIAL ADENOCARCINOMA, FIGO GRADE 2 (4.7 CM) THE TUMOR INVADES LESS THAN ONE-HALF OF THE MYOMETRIUM (PT1A) ALL MARGINS OF RESECTION ARE NEGATIVE FOR CARCINOMA LEIOMYOMAS AND ADENOMYOSIS BILATERAL FALLOPIAN TUBES AND OVARIES: HISTOLOGICAL UNREMARKABLE 2. Lymph node, sentinel, biopsy, right  obturator ONE BENIGN LYMPH NODE (0/1) 3. Lymph nodes, regional resection, left pelvic FOUR BENIGN LYMPH NODES (0/4)    01/02/2016 Surgery    Dr. Denman George performed robotic-assisted laparoscopic total hysterectomy with bilateral salpingoophorectomy, sentinel lymph node biopsy, lymphadenectomy      05/15/2016 Pathology Results    Vagina, biopsy, mid - ADENOCARCINOMA, SEE COMMENT. Microscopic Comment The morphology along with the patient's history are consistent with recurrent endometrioid adenocarcinoma. The carcinoma has a similar appearance to the primary ([id number]).    05/20/2016 Imaging    Ct scan abdomen showed solid 2.5 cm peritoneal mass in the mid to anterior left pelvis, suspicious for peritoneal metastasis. 2. Small expansile low-attenuation filling defect in the left external iliac vein, cannot exclude a small deep venous thrombus. Consider correlation with left lower extremity venous Doppler scan. 3. Small simple fluid density structure in the left pelvic sidewall abutting the left external iliac vessels, favor a small postoperative seroma. 4. No ascites. 5. No lymphadenopathy.  No metastatic disease in the chest. 6. Aortic atherosclerosis.    06/12/2016 PET scan    Intensely hypermetabolic 2.1 cm central pelvic peritoneal mass just to the left of midline, consistent with peritoneal metastatic recurrence. No ascites. 2. No additional hypermetabolic sites of metastatic disease. 3. Diffuse thyroid hypermetabolism without discrete thyroid nodule, favoring thyroiditis. Recommend correlation with serum thyroid function tests.    06/24/2016 - 07/22/2016 Chemotherapy    The patient had weekly cisplatin. She has missed several doses due to infection and pancytopenia    06/24/2016 - 09/04/2016 Radiation Therapy    She completed concurrent radiation therapy Radiation treatment dates:   IMRT : 06/24/16 - 08/01/16 HDR : 08/13/16, 08/20/16, 08/27/16, 09/04/16  Site/dose:   Pelvis  treated to 55 Gy  in 25 fractions (simultaneous integrated boost technique) Vaginal Cuff treated to 24 Gy in 4 fractions    08/15/2016 - 12/03/2016 Chemotherapy    She received 6 cycles of carboplatin/Taxol    09/27/2016 Imaging    Interval decrease in size of previously described solid peritoneal nodule within the left anterior pelvis. Near complete resolution of previously described low-attenuation structure along the left pelvic sidewall. No evidence for metastatic disease in the chest. Aortic atherosclerosis.    12/30/2016 Imaging    Ct abdomen 1. Solitary left pelvic peritoneal implant is mildly decreased in size in the interval. 2. No new or progressive metastatic disease in the abdomen or pelvis. No ascites. 3. Aortic atherosclerosis.    04/02/2017 Imaging    Left lower quadrant peritoneal implant referenced on previous exam measures 1.7 x 1.3 cm, image 69 of series 2. Increased from 0.8 x 0.8 cm previously peer no new peritoneal implants identified.  Musculoskeletal: The degenerative disc disease noted within the lumbar spine.  IMPRESSION: 1. Solitary left pelvic peritoneal implant is increased in size in the interval. 2. No new sites of disease.  No ascites. 3. Aortic atherosclerosis    04/11/2017 PET scan    1. The left side of pelvis peritoneal implant has decreased in size and degree of FDG uptake compatible with response to therapy. No new areas of peritoneal disease identified. 2. Persistent diffuse increased uptake within the thyroid gland. Correlation with patient's thyroid function may be helpful.    05/26/2017 Imaging    1. Enlarging tumor implant along the left adnexa, currently 2.6 by 2.4 cm and previously 1.9 by 1.3 cm. No new tumor implant or other specific cause for the patient's pelvic symptoms is currently identified. 2.  Aortic Atherosclerosis (ICD10-I70.0). 3. Lumbar spondylosis and degenerative disc disease causing multilevel impingement.    06/04/2017 - 08/28/2017  Chemotherapy    The patient started letrozole and everolimus    08/26/2017 Imaging    Increased size of mass in the left adnexal region.  Several new small liver metastases in right hepatic lobe    09/08/2017 Imaging    LV EF: 60% -  65%    09/10/2017 Procedure    Technically successful right IJ power-injectable port catheter placement. Ready for routine use    12/04/2017 Imaging    1. Interval increase in size and number of multiple lesions within the liver compatible with hepatic metastatic disease. 2. Interval increase in size mass within the left hemipelvis.    12/15/2017 -  Chemotherapy    The patient had pembrolizumab (KEYTRUDA) 200 mg in sodium chloride 0.9 % 50 mL chemo infusion, 200 mg, Intravenous, Once, 1 of 6 cycles Administration: 200 mg (12/15/2017)  for chemotherapy treatment.     12/21/2017 Imaging    1. Moderate left hydronephrosis and hydroureter, similar compared to most recent CT from May 2019; obstruction appears to be secondary to a left pelvic mass/metastatic focus which has increased in size since the prior CT. 2. Numerous hepatic metastatic lesions, suspect that some may be increased in size but difficult to further characterize without intravenous contrast. Increased size of left pelvic soft tissue mass/metastatic lesion. Mass abuts and possibly invades the sigmoid colon.     02/20/2018 Imaging    Interval decrease in hepatic metastases.  Decreased mass or lymphadenopathy in the left external iliac chain. Interval resolution of left hydroureteronephrosis.  No new or progressive disease within the abdomen or pelvis.    05/16/2018 Imaging  05/16/2018 CT Abdomen IMPRESSION: 1. Slight interval decrease in size of hepatic metastatic disease. 2. Slight interval decrease in size centrally necrotic mass within the left external iliac region.     REVIEW OF SYSTEMS:   Constitutional: Denies fevers, chills or abnormal weight loss Eyes: Denies blurriness of  vision Ears, nose, mouth, throat, and face: Denies mucositis or sore throat Respiratory: Denies cough, dyspnea or wheezes Cardiovascular: Denies palpitation, chest discomfort or lower extremity swelling Gastrointestinal:  Denies nausea, heartburn or change in bowel habits Skin: Denies abnormal skin rashes Lymphatics: Denies new lymphadenopathy or easy bruising Neurological:Denies numbness, tingling or new weaknesses Behavioral/Psych: Mood is stable, no new changes  All other systems were reviewed with the patient and are negative.  I have reviewed the past medical history, past surgical history, social history and family history with the patient and they are unchanged from previous note.  ALLERGIES:  is allergic to latex.  MEDICATIONS:  Current Outpatient Medications  Medication Sig Dispense Refill  . acetaminophen (TYLENOL) 325 MG tablet Take 650 mg by mouth every 6 (six) hours as needed for mild pain or moderate pain.     Marland Kitchen amoxicillin (AMOXIL) 500 MG capsule Take 2,000 mg by mouth See admin instructions. Prior to dental appointment, takes 4 tables prior to appt    . Biotin w/ Vitamins C & E (HAIR/SKIN/NAILS PO) Take 1 tablet by mouth daily.    . Calcium Carb-Cholecalciferol (CALCIUM 600 + D PO) Take 2 tablets by mouth daily.    . fluticasone (FLONASE) 50 MCG/ACT nasal spray Place 1 spray into both nostrils 2 (two) times daily as needed for allergies.   1  . gabapentin (NEURONTIN) 600 MG tablet TAKE 1 TABLET BY MOUTH TWICE A DAY (Patient taking differently: Take 1 tablet 2 times a day) 60 tablet 9  . ibuprofen (ADVIL,MOTRIN) 200 MG tablet Take 400 mg by mouth every 8 (eight) hours as needed for mild pain.    Marland Kitchen levothyroxine (SYNTHROID, LEVOTHROID) 150 MCG tablet TAKE 1 TABLET (150 MCG TOTAL) BY MOUTH DAILY BEFORE BREAKFAST. 30 tablet 1  . loratadine (CLARITIN) 10 MG tablet Take 10 mg by mouth daily.    Marland Kitchen LORazepam (ATIVAN) 1 MG tablet TAKE 1 TABLET BY MOUTH EVERY 8 HOURS AS NEEDED FOR  ANXIETY OR SLEEP 90 tablet 0  . losartan-hydrochlorothiazide (HYZAAR) 100-12.5 MG tablet Take 0.5 tablets by mouth daily. 60 tablet 9  . Magnesium 250 MG TABS Take 250 mg by mouth daily.    . magnesium oxide (MAG-OX) 400 (241.3 Mg) MG tablet Take 1 tablet (400 mg total) by mouth 2 (two) times daily. 60 tablet 9  . Melatonin 10 MG CAPS Take 10 mg by mouth at bedtime.    . ondansetron (ZOFRAN) 8 MG tablet Take 1 tablet (8 mg total) by mouth every 8 (eight) hours as needed for nausea. 60 tablet 1  . traZODone (DESYREL) 50 MG tablet Take 1.5 tablets (75 mg total) by mouth at bedtime. (Patient taking differently: Take 50 mg by mouth at bedtime. ) 135 tablet 1   No current facility-administered medications for this visit.    Facility-Administered Medications Ordered in Other Visits  Medication Dose Route Frequency Provider Last Rate Last Dose  . heparin lock flush 100 unit/mL  500 Units Intracatheter Once PRN Alvy Bimler, Evgenia Merriman, MD      . pembrolizumab (KEYTRUDA) 200 mg in sodium chloride 0.9 % 50 mL chemo infusion  200 mg Intravenous Once Heath Lark, MD 116 mL/hr at 08/10/18 1456 200  mg at 08/10/18 1456  . sodium chloride flush (NS) 0.9 % injection 10 mL  10 mL Intracatheter PRN Alvy Bimler, Gurtej Noyola, MD        PHYSICAL EXAMINATION: ECOG PERFORMANCE STATUS: 1 - Symptomatic but completely ambulatory  Vitals:   08/10/18 1329  BP: (!) 148/73  Pulse: 73  Resp: 18  Temp: 98.2 F (36.8 C)  SpO2: 100%   Filed Weights   08/10/18 1329  Weight: 198 lb 6.4 oz (90 kg)    GENERAL:alert, no distress and comfortable SKIN: skin color, texture, turgor are normal, no rashes or significant lesions EYES: normal, Conjunctiva are pink and non-injected, sclera clear OROPHARYNX:no exudate, no erythema and lips, buccal mucosa, and tongue normal  NECK: supple, thyroid normal size, non-tender, without nodularity LYMPH:  no palpable lymphadenopathy in the cervical, axillary or inguinal LUNGS: clear to auscultation and  percussion with normal breathing effort HEART: regular rate & rhythm and no murmurs and no lower extremity edema ABDOMEN:abdomen soft, non-tender and normal bowel sounds Musculoskeletal:no cyanosis of digits and no clubbing  NEURO: alert & oriented x 3 with fluent speech, no focal motor/sensory deficits  LABORATORY DATA:  I have reviewed the data as listed    Component Value Date/Time   NA 140 08/10/2018 1300   NA 141 07/09/2017 1312   K 3.7 08/10/2018 1300   K 3.9 07/09/2017 1312   CL 105 08/10/2018 1300   CO2 27 08/10/2018 1300   CO2 27 07/09/2017 1312   GLUCOSE 124 (H) 08/10/2018 1300   GLUCOSE 109 07/09/2017 1312   BUN 29 (H) 08/10/2018 1300   BUN 16.4 07/09/2017 1312   CREATININE 0.85 08/10/2018 1300   CREATININE 0.8 07/09/2017 1312   CALCIUM 9.2 08/10/2018 1300   CALCIUM 9.2 07/09/2017 1312   PROT 6.8 08/10/2018 1300   PROT 7.1 07/09/2017 1312   ALBUMIN 3.6 08/10/2018 1300   ALBUMIN 3.1 (L) 07/09/2017 1312   AST 15 08/10/2018 1300   AST 35 (H) 07/09/2017 1312   ALT 11 08/10/2018 1300   ALT 43 07/09/2017 1312   ALKPHOS 137 (H) 08/10/2018 1300   ALKPHOS 182 (H) 07/09/2017 1312   BILITOT 0.4 08/10/2018 1300   BILITOT 0.34 07/09/2017 1312   GFRNONAA >60 08/10/2018 1300   GFRAA >60 08/10/2018 1300    No results found for: SPEP, UPEP  Lab Results  Component Value Date   WBC 5.7 08/10/2018   NEUTROABS 3.8 08/10/2018   HGB 11.3 (L) 08/10/2018   HCT 34.3 (L) 08/10/2018   MCV 88.4 08/10/2018   PLT 173 08/10/2018      Chemistry      Component Value Date/Time   NA 140 08/10/2018 1300   NA 141 07/09/2017 1312   K 3.7 08/10/2018 1300   K 3.9 07/09/2017 1312   CL 105 08/10/2018 1300   CO2 27 08/10/2018 1300   CO2 27 07/09/2017 1312   BUN 29 (H) 08/10/2018 1300   BUN 16.4 07/09/2017 1312   CREATININE 0.85 08/10/2018 1300   CREATININE 0.8 07/09/2017 1312      Component Value Date/Time   CALCIUM 9.2 08/10/2018 1300   CALCIUM 9.2 07/09/2017 1312   ALKPHOS 137  (H) 08/10/2018 1300   ALKPHOS 182 (H) 07/09/2017 1312   AST 15 08/10/2018 1300   AST 35 (H) 07/09/2017 1312   ALT 11 08/10/2018 1300   ALT 43 07/09/2017 1312   BILITOT 0.4 08/10/2018 1300   BILITOT 0.34 07/09/2017 1312      All questions were  answered. The patient knows to call the clinic with any problems, questions or concerns. No barriers to learning was detected.  I spent 15 minutes counseling the patient face to face. The total time spent in the appointment was 20 minutes and more than 50% was on counseling and review of test results  Heath Lark, MD 08/10/2018 3:01 PM

## 2018-08-10 NOTE — Patient Instructions (Signed)
[facility] Discharge Instructions for Patients Receiving Chemotherapy  Today you received the following chemotherapy agents:  Keytruda.  To help prevent nausea and vomiting after your treatment, we encourage you to take your nausea medication as directed.   If you develop nausea and vomiting that is not controlled by your nausea medication, call the clinic.   BELOW ARE SYMPTOMS THAT SHOULD BE REPORTED IMMEDIATELY:  *FEVER GREATER THAN 100.5 F  *CHILLS WITH OR WITHOUT FEVER  NAUSEA AND VOMITING THAT IS NOT CONTROLLED WITH YOUR NAUSEA MEDICATION  *UNUSUAL SHORTNESS OF BREATH  *UNUSUAL BRUISING OR BLEEDING  TENDERNESS IN MOUTH AND THROAT WITH OR WITHOUT PRESENCE OF ULCERS  *URINARY PROBLEMS  *BOWEL PROBLEMS  UNUSUAL RASH Items with * indicate a potential emergency and should be followed up as soon as possible.  Feel free to call the clinic should you have any questions or concerns. The clinic phone number is [phone].  Please show the CHEMO ALERT CARD at check-in to the Emergency Department and triage nurse.    

## 2018-08-10 NOTE — Assessment & Plan Note (Signed)
Her recent TSH is satisfactory She will continue chronic thyroid replacement therapy We will adjust her thyroid medicine as needed 

## 2018-08-10 NOTE — Telephone Encounter (Signed)
No los °

## 2018-08-10 NOTE — Assessment & Plan Note (Signed)
She tolerated chemotherapy very well She is off all pain medicine We will continue immunotherapy and I plan to repeat imaging study in March

## 2018-08-10 NOTE — Telephone Encounter (Signed)
-----   Message from Heath Lark, MD sent at 08/10/2018  3:05 PM EST ----- Regarding: TSH is better Pls let her know TSH is better. No change in her thyroid medicine ----- Message ----- From: Interface, Lab In [place] Sent: 08/10/2018   1:32 PM EST To: Heath Lark, MD

## 2018-08-10 NOTE — Assessment & Plan Note (Signed)
She has chronic insomnia and has taken benzodiazepines in the past to help with her sleep She will continue melatonin and trazodone and occasional benzodiazepine as needed

## 2018-08-10 NOTE — Telephone Encounter (Signed)
Called and left below message. Ask her to call for questions. 

## 2018-08-25 ENCOUNTER — Other Ambulatory Visit: Payer: Self-pay | Admitting: Hematology and Oncology

## 2018-08-31 ENCOUNTER — Encounter: Payer: Self-pay | Admitting: Hematology and Oncology

## 2018-08-31 ENCOUNTER — Other Ambulatory Visit: Payer: Self-pay

## 2018-08-31 ENCOUNTER — Inpatient Hospital Stay: Payer: Medicare Other

## 2018-08-31 ENCOUNTER — Inpatient Hospital Stay (HOSPITAL_BASED_OUTPATIENT_CLINIC_OR_DEPARTMENT_OTHER): Payer: Medicare Other | Admitting: Hematology and Oncology

## 2018-08-31 DIAGNOSIS — E039 Hypothyroidism, unspecified: Secondary | ICD-10-CM | POA: Diagnosis not present

## 2018-08-31 DIAGNOSIS — C541 Malignant neoplasm of endometrium: Secondary | ICD-10-CM

## 2018-08-31 DIAGNOSIS — K5909 Other constipation: Secondary | ICD-10-CM | POA: Diagnosis not present

## 2018-08-31 DIAGNOSIS — Z5112 Encounter for antineoplastic immunotherapy: Secondary | ICD-10-CM | POA: Diagnosis not present

## 2018-08-31 LAB — CMP (CANCER CENTER ONLY)
ALT: 14 U/L (ref 0–44)
ANION GAP: 10 (ref 5–15)
AST: 16 U/L (ref 15–41)
Albumin: 3.7 g/dL (ref 3.5–5.0)
Alkaline Phosphatase: 129 U/L — ABNORMAL HIGH (ref 38–126)
BUN: 24 mg/dL — ABNORMAL HIGH (ref 8–23)
CO2: 26 mmol/L (ref 22–32)
Calcium: 9.3 mg/dL (ref 8.9–10.3)
Chloride: 103 mmol/L (ref 98–111)
Creatinine: 0.84 mg/dL (ref 0.44–1.00)
GFR, Est AFR Am: >60 mL/min (ref >60)
GFR, Estimated: >60 mL/min (ref >60)
Glucose, Bld: 102 mg/dL — ABNORMAL HIGH (ref 70–99)
Potassium: 3.8 mmol/L (ref 3.5–5.1)
Sodium: 139 mmol/L (ref 135–145)
Total Bilirubin: 0.6 mg/dL (ref 0.3–1.2)
Total Protein: 7 g/dL (ref 6.5–8.1)

## 2018-08-31 LAB — CBC WITH DIFFERENTIAL (CANCER CENTER ONLY)
Abs Immature Granulocytes: 0.02 10*3/uL (ref 0.00–0.07)
BASOS PCT: 0 %
Basophils Absolute: 0 10*3/uL (ref 0.0–0.1)
EOS ABS: 0.2 10*3/uL (ref 0.0–0.5)
Eosinophils Relative: 2 %
HCT: 37.7 % (ref 36.0–46.0)
Hemoglobin: 12.5 g/dL (ref 12.0–15.0)
Immature Granulocytes: 0 %
Lymphocytes Relative: 17 %
Lymphs Abs: 1.1 10*3/uL (ref 0.7–4.0)
MCH: 29.1 pg (ref 26.0–34.0)
MCHC: 33.2 g/dL (ref 30.0–36.0)
MCV: 87.9 fL (ref 80.0–100.0)
Monocytes Absolute: 0.4 10*3/uL (ref 0.1–1.0)
Monocytes Relative: 7 %
Neutro Abs: 4.6 10*3/uL (ref 1.7–7.7)
Neutrophils Relative %: 74 %
Platelet Count: 174 10*3/uL (ref 150–400)
RBC: 4.29 MIL/uL (ref 3.87–5.11)
RDW: 13.2 % (ref 11.5–15.5)
WBC Count: 6.2 10*3/uL (ref 4.0–10.5)
nRBC: 0 % (ref 0.0–0.2)

## 2018-08-31 LAB — TSH: TSH: 11.568 u[IU]/mL — AB (ref 0.308–3.960)

## 2018-08-31 MED ORDER — SODIUM CHLORIDE 0.9% FLUSH
10.0000 mL | Freq: Once | INTRAVENOUS | Status: AC
  Administered 2018-08-31: 10 mL
  Filled 2018-08-31: qty 10

## 2018-08-31 MED ORDER — HEPARIN SOD (PORK) LOCK FLUSH 100 UNIT/ML IV SOLN
500.0000 [IU] | Freq: Once | INTRAVENOUS | Status: AC | PRN
  Administered 2018-08-31: 500 [IU]
  Filled 2018-08-31: qty 5

## 2018-08-31 MED ORDER — SODIUM CHLORIDE 0.9% FLUSH
10.0000 mL | INTRAVENOUS | Status: DC | PRN
  Administered 2018-08-31: 10 mL
  Filled 2018-08-31: qty 10

## 2018-08-31 MED ORDER — SODIUM CHLORIDE 0.9 % IV SOLN
Freq: Once | INTRAVENOUS | Status: AC
  Administered 2018-08-31: 14:00:00 via INTRAVENOUS
  Filled 2018-08-31: qty 250

## 2018-08-31 MED ORDER — SODIUM CHLORIDE 0.9 % IV SOLN
200.0000 mg | Freq: Once | INTRAVENOUS | Status: AC
  Administered 2018-08-31: 200 mg via INTRAVENOUS
  Filled 2018-08-31: qty 8

## 2018-08-31 MED ORDER — LEVOTHYROXINE SODIUM 175 MCG PO TABS: 175.0000 ug | ORAL_TABLET | Freq: Every day | ORAL | 2 refills | Status: DC

## 2018-08-31 NOTE — Assessment & Plan Note (Signed)
She tolerated chemotherapy very well She is off all pain medicine We will continue immunotherapy and I plan to repeat imaging study in March

## 2018-08-31 NOTE — Telephone Encounter (Signed)
LVM for pt letting her know her TSH level still high and a new prescription for levothyroxine 180mcg has been sent to her pharmacy.

## 2018-08-31 NOTE — Patient Instructions (Signed)
[facility] Discharge Instructions for Patients Receiving Chemotherapy  Today you received the following chemotherapy agents Pembrolizumab (KEYTRUDA).  To help prevent nausea and vomiting after your treatment, we encourage you to take your nausea medication as prescribed.   If you develop nausea and vomiting that is not controlled by your nausea medication, call the clinic.   BELOW ARE SYMPTOMS THAT SHOULD BE REPORTED IMMEDIATELY:  *FEVER GREATER THAN 100.5 F  *CHILLS WITH OR WITHOUT FEVER  NAUSEA AND VOMITING THAT IS NOT CONTROLLED WITH YOUR NAUSEA MEDICATION  *UNUSUAL SHORTNESS OF BREATH  *UNUSUAL BRUISING OR BLEEDING  TENDERNESS IN MOUTH AND THROAT WITH OR WITHOUT PRESENCE OF ULCERS  *URINARY PROBLEMS  *BOWEL PROBLEMS  UNUSUAL RASH Items with * indicate a potential emergency and should be followed up as soon as possible.  Feel free to call the clinic should you have any questions or concerns. The clinic phone number is [phone].  Please show the CHEMO ALERT CARD at check-in to the Emergency Department and triage nurse.   

## 2018-08-31 NOTE — Progress Notes (Signed)
[place] OFFICE PROGRESS NOTE  Patient Care Team: [name], MD as PCP - General (Family Medicine)  ASSESSMENT & PLAN:  Recurrent carcinoma of endometrium [facility]) She tolerated chemotherapy very well She is off all pain medicine We will continue immunotherapy and I plan to repeat imaging study in March  Acquired hypothyroidism Her recent TSH is satisfactory She will continue chronic thyroid replacement therapy We will adjust her thyroid medicine as needed  Chronic constipation She continues to have mild chronic constipation She will continue stool softener and laxatives as needed   No orders of the defined types were placed in this encounter.   INTERVAL HISTORY: Please see below for problem oriented charting. She returns for further follow-up She feels well She has occasional constipation requiring laxative She denies pain Her energy level is good The patient denies any recent signs or symptoms of bleeding such as spontaneous epistaxis, hematuria or hematochezia.   SUMMARY OF ONCOLOGIC HISTORY: Oncology History   MSI stable on June 2017 tissue but high from [facility] study from November 2017  05/15/16: ER is moderately positive (70%). PR is strongly positive (80%).     Recurrent carcinoma of endometrium ([place])   01/02/2016 Pathology Results    Uterus +/- tubes/ovaries, neoplastic, cervix ENDOMETRIAL ADENOCARCINOMA, FIGO GRADE 2 (4.7 CM) THE TUMOR INVADES LESS THAN ONE-HALF OF THE MYOMETRIUM (PT1A) ALL MARGINS OF RESECTION ARE NEGATIVE FOR CARCINOMA LEIOMYOMAS AND ADENOMYOSIS BILATERAL FALLOPIAN TUBES AND OVARIES: HISTOLOGICAL UNREMARKABLE 2. Lymph node, sentinel, biopsy, right obturator ONE BENIGN LYMPH NODE (0/1) 3. Lymph nodes, regional resection, left pelvic FOUR BENIGN LYMPH NODES (0/4)    01/02/2016 Surgery    Dr. Denman George performed robotic-assisted laparoscopic total hysterectomy with bilateral salpingoophorectomy, sentinel lymph node  biopsy, lymphadenectomy      05/15/2016 Pathology Results    Vagina, biopsy, mid - ADENOCARCINOMA, SEE COMMENT. Microscopic Comment The morphology along with the patient's history are consistent with recurrent endometrioid adenocarcinoma. The carcinoma has a similar appearance to the primary ([id number]).    05/20/2016 Imaging    Ct scan abdomen showed solid 2.5 cm peritoneal mass in the mid to anterior left pelvis, suspicious for peritoneal metastasis. 2. Small expansile low-attenuation filling defect in the left external iliac vein, cannot exclude a small deep venous thrombus. Consider correlation with left lower extremity venous Doppler scan. 3. Small simple fluid density structure in the left pelvic sidewall abutting the left external iliac vessels, favor a small postoperative seroma. 4. No ascites. 5. No lymphadenopathy.  No metastatic disease in the chest. 6. Aortic atherosclerosis.    06/12/2016 PET scan    Intensely hypermetabolic 2.1 cm central pelvic peritoneal mass just to the left of midline, consistent with peritoneal metastatic recurrence. No ascites. 2. No additional hypermetabolic sites of metastatic disease. 3. Diffuse thyroid hypermetabolism without discrete thyroid nodule, favoring thyroiditis. Recommend correlation with serum thyroid function tests.    06/24/2016 - 07/22/2016 Chemotherapy    The patient had weekly cisplatin. She has missed several doses due to infection and pancytopenia    06/24/2016 - 09/04/2016 Radiation Therapy    She completed concurrent radiation therapy Radiation treatment dates:   IMRT : 06/24/16 - 08/01/16 HDR : 08/13/16, 08/20/16, 08/27/16, 09/04/16  Site/dose:   Pelvis treated to 55 Gy in 25 fractions (simultaneous integrated boost technique) Vaginal Cuff treated to 24 Gy in 4 fractions    08/15/2016 - 12/03/2016 Chemotherapy    She received 6 cycles of carboplatin/Taxol    09/27/2016 Imaging  Interval decrease in size of previously described solid  peritoneal nodule within the left anterior pelvis. Near complete resolution of previously described low-attenuation structure along the left pelvic sidewall. No evidence for metastatic disease in the chest. Aortic atherosclerosis.    12/30/2016 Imaging    Ct abdomen 1. Solitary left pelvic peritoneal implant is mildly decreased in size in the interval. 2. No new or progressive metastatic disease in the abdomen or pelvis. No ascites. 3. Aortic atherosclerosis.    04/02/2017 Imaging    Left lower quadrant peritoneal implant referenced on previous exam measures 1.7 x 1.3 cm, image 69 of series 2. Increased from 0.8 x 0.8 cm previously peer no new peritoneal implants identified.  Musculoskeletal: The degenerative disc disease noted within the lumbar spine.  IMPRESSION: 1. Solitary left pelvic peritoneal implant is increased in size in the interval. 2. No new sites of disease.  No ascites. 3. Aortic atherosclerosis    04/11/2017 PET scan    1. The left side of pelvis peritoneal implant has decreased in size and degree of FDG uptake compatible with response to therapy. No new areas of peritoneal disease identified. 2. Persistent diffuse increased uptake within the thyroid gland. Correlation with patient's thyroid function may be helpful.    05/26/2017 Imaging    1. Enlarging tumor implant along the left adnexa, currently 2.6 by 2.4 cm and previously 1.9 by 1.3 cm. No new tumor implant or other specific cause for the patient's pelvic symptoms is currently identified. 2.  Aortic Atherosclerosis (ICD10-I70.0). 3. Lumbar spondylosis and degenerative disc disease causing multilevel impingement.    06/04/2017 - 08/28/2017 Chemotherapy    The patient started letrozole and everolimus    08/26/2017 Imaging    Increased size of mass in the left adnexal region.  Several new small liver metastases in right hepatic lobe    09/08/2017 Imaging    LV EF: 60% -  65%    09/10/2017 Procedure     Technically successful right IJ power-injectable port catheter placement. Ready for routine use    12/04/2017 Imaging    1. Interval increase in size and number of multiple lesions within the liver compatible with hepatic metastatic disease. 2. Interval increase in size mass within the left hemipelvis.    12/15/2017 -  Chemotherapy    The patient had pembrolizumab (KEYTRUDA) 200 mg in sodium chloride 0.9 % 50 mL chemo infusion, 200 mg, Intravenous, Once, 1 of 6 cycles Administration: 200 mg (12/15/2017)  for chemotherapy treatment.     12/21/2017 Imaging    1. Moderate left hydronephrosis and hydroureter, similar compared to most recent CT from May 2019; obstruction appears to be secondary to a left pelvic mass/metastatic focus which has increased in size since the prior CT. 2. Numerous hepatic metastatic lesions, suspect that some may be increased in size but difficult to further characterize without intravenous contrast. Increased size of left pelvic soft tissue mass/metastatic lesion. Mass abuts and possibly invades the sigmoid colon.     02/20/2018 Imaging    Interval decrease in hepatic metastases.  Decreased mass or lymphadenopathy in the left external iliac chain. Interval resolution of left hydroureteronephrosis.  No new or progressive disease within the abdomen or pelvis.    05/16/2018 Imaging    05/16/2018 CT Abdomen IMPRESSION: 1. Slight interval decrease in size of hepatic metastatic disease. 2. Slight interval decrease in size centrally necrotic mass within the left external iliac region.     REVIEW OF SYSTEMS:   Constitutional: Denies fevers, chills  or abnormal weight loss Eyes: Denies blurriness of vision Ears, nose, mouth, throat, and face: Denies mucositis or sore throat Respiratory: Denies cough, dyspnea or wheezes Cardiovascular: Denies palpitation, chest discomfort or lower extremity swelling Skin: Denies abnormal skin rashes Lymphatics: Denies new lymphadenopathy  or easy bruising Neurological:Denies numbness, tingling or new weaknesses Behavioral/Psych: Mood is stable, no new changes  All other systems were reviewed with the patient and are negative.  I have reviewed the past medical history, past surgical history, social history and family history with the patient and they are unchanged from previous note.  ALLERGIES:  is allergic to latex.  MEDICATIONS:  Current Outpatient Medications  Medication Sig Dispense Refill  . acetaminophen (TYLENOL) 325 MG tablet Take 650 mg by mouth every 6 (six) hours as needed for mild pain or moderate pain.     Marland Kitchen ALPRAZolam (XANAX) 1 MG tablet TAKE 1/2 TO 1 TABLET BY MOUTH AT BEDTIME AS NEEDED FOR INSOMIA/RESTLESS LEG    . amoxicillin (AMOXIL) 500 MG capsule Take 2,000 mg by mouth See admin instructions. Prior to dental appointment, takes 4 tables prior to appt    . Biotin w/ Vitamins C & E (HAIR/SKIN/NAILS PO) Take 1 tablet by mouth daily.    . Calcium Carb-Cholecalciferol (CALCIUM 600 + D PO) Take 2 tablets by mouth daily.    . fluticasone (FLONASE) 50 MCG/ACT nasal spray Place 1 spray into both nostrils 2 (two) times daily as needed for allergies.   1  . gabapentin (NEURONTIN) 600 MG tablet TAKE 1 TABLET BY MOUTH TWICE A DAY (Patient taking differently: Take 1 tablet 2 times a day) 60 tablet 9  . gabapentin (NEURONTIN) 600 MG tablet TAKE 1 TABLET BY MOUTH TWICE A DAY FOR RESTLESS LEGS AND NEUROPATHY.    Marland Kitchen ibuprofen (ADVIL,MOTRIN) 200 MG tablet Take 400 mg by mouth every 8 (eight) hours as needed for mild pain.    Marland Kitchen levothyroxine (SYNTHROID, LEVOTHROID) 150 MCG tablet TAKE 1 TABLET (150 MCG TOTAL) BY MOUTH DAILY BEFORE BREAKFAST. 30 tablet 1  . loratadine (CLARITIN) 10 MG tablet Take 10 mg by mouth daily.    Marland Kitchen LORazepam (ATIVAN) 1 MG tablet TAKE 1 TABLET BY MOUTH EVERY 8 HOURS AS NEEDED FOR ANXIETY OR SLEEP 90 tablet 0  . losartan-hydrochlorothiazide (HYZAAR) 100-12.5 MG tablet Take 0.5 tablets by mouth daily. 60  tablet 9  . Magnesium 250 MG TABS Take 250 mg by mouth daily.    . magnesium oxide (MAG-OX) 400 (241.3 Mg) MG tablet Take 1 tablet (400 mg total) by mouth 2 (two) times daily. 60 tablet 9  . magnesium oxide (MAG-OX) 400 MG tablet Take 1 tablet by mouth 2 (two) times daily.    . Melatonin 10 MG CAPS Take 10 mg by mouth at bedtime.    . ondansetron (ZOFRAN) 8 MG tablet Take 1 tablet (8 mg total) by mouth every 8 (eight) hours as needed for nausea. 60 tablet 1  . potassium gluconate 595 (99 K) MG TABS tablet Take by mouth.    . traZODone (DESYREL) 50 MG tablet Take 1.5 tablets (75 mg total) by mouth at bedtime. (Patient taking differently: Take 50 mg by mouth at bedtime. ) 135 tablet 1   No current facility-administered medications for this visit.    Facility-Administered Medications Ordered in Other Visits  Medication Dose Route Frequency Provider Last Rate Last Dose  . heparin lock flush 100 unit/mL  500 Units Intracatheter Once PRN Heath Lark, MD      .  pembrolizumab (KEYTRUDA) 200 mg in sodium chloride 0.9 % 50 mL chemo infusion  200 mg Intravenous Once Yetzali Weld, MD      . sodium chloride flush (NS) 0.9 % injection 10 mL  10 mL Intracatheter PRN Alvy Bimler, Avondre Richens, MD        PHYSICAL EXAMINATION: ECOG PERFORMANCE STATUS: 1 - Symptomatic but completely ambulatory  Vitals:   08/31/18 1252  BP: (!) 144/75  Pulse: 71  Resp: 18  Temp: 97.7 F (36.5 C)  SpO2: 100%   Filed Weights   08/31/18 1252  Weight: 194 lb (88 kg)    GENERAL:alert, no distress and comfortable SKIN: skin color, texture, turgor are normal, no rashes or significant lesions EYES: normal, Conjunctiva are pink and non-injected, sclera clear OROPHARYNX:no exudate, no erythema and lips, buccal mucosa, and tongue normal  NECK: supple, thyroid normal size, non-tender, without nodularity LYMPH:  no palpable lymphadenopathy in the cervical, axillary or inguinal LUNGS: clear to auscultation and percussion with normal  breathing effort HEART: regular rate & rhythm and no murmurs and no lower extremity edema ABDOMEN:abdomen soft, non-tender and normal bowel sounds Musculoskeletal:no cyanosis of digits and no clubbing  NEURO: alert & oriented x 3 with fluent speech, no focal motor/sensory deficits  LABORATORY DATA:  I have reviewed the data as listed    Component Value Date/Time   NA 139 08/31/2018 1219   NA 141 07/09/2017 1312   K 3.8 08/31/2018 1219   K 3.9 07/09/2017 1312   CL 103 08/31/2018 1219   CO2 26 08/31/2018 1219   CO2 27 07/09/2017 1312   GLUCOSE 102 (H) 08/31/2018 1219   GLUCOSE 109 07/09/2017 1312   BUN 24 (H) 08/31/2018 1219   BUN 16.4 07/09/2017 1312   CREATININE 0.84 08/31/2018 1219   CREATININE 0.8 07/09/2017 1312   CALCIUM 9.3 08/31/2018 1219   CALCIUM 9.2 07/09/2017 1312   PROT 7.0 08/31/2018 1219   PROT 7.1 07/09/2017 1312   ALBUMIN 3.7 08/31/2018 1219   ALBUMIN 3.1 (L) 07/09/2017 1312   AST 16 08/31/2018 1219   AST 35 (H) 07/09/2017 1312   ALT 14 08/31/2018 1219   ALT 43 07/09/2017 1312   ALKPHOS 129 (H) 08/31/2018 1219   ALKPHOS 182 (H) 07/09/2017 1312   BILITOT 0.6 08/31/2018 1219   BILITOT 0.34 07/09/2017 1312   GFRNONAA >60 08/31/2018 1219   GFRAA >60 08/31/2018 1219    No results found for: SPEP, UPEP  Lab Results  Component Value Date   WBC 6.2 08/31/2018   NEUTROABS 4.6 08/31/2018   HGB 12.5 08/31/2018   HCT 37.7 08/31/2018   MCV 87.9 08/31/2018   PLT 174 08/31/2018      Chemistry      Component Value Date/Time   NA 139 08/31/2018 1219   NA 141 07/09/2017 1312   K 3.8 08/31/2018 1219   K 3.9 07/09/2017 1312   CL 103 08/31/2018 1219   CO2 26 08/31/2018 1219   CO2 27 07/09/2017 1312   BUN 24 (H) 08/31/2018 1219   BUN 16.4 07/09/2017 1312   CREATININE 0.84 08/31/2018 1219   CREATININE 0.8 07/09/2017 1312      Component Value Date/Time   CALCIUM 9.3 08/31/2018 1219   CALCIUM 9.2 07/09/2017 1312   ALKPHOS 129 (H) 08/31/2018 1219   ALKPHOS  182 (H) 07/09/2017 1312   AST 16 08/31/2018 1219   AST 35 (H) 07/09/2017 1312   ALT 14 08/31/2018 1219   ALT 43 07/09/2017 1312   BILITOT  0.6 08/31/2018 1219   BILITOT 0.34 07/09/2017 1312      All questions were answered. The patient knows to call the clinic with any problems, questions or concerns. No barriers to learning was detected.  I spent 15 minutes counseling the patient face to face. The total time spent in the appointment was 20 minutes and more than 50% was on counseling and review of test results  Heath Lark, MD 08/31/2018 1:54 PM

## 2018-08-31 NOTE — Assessment & Plan Note (Signed)
Her recent TSH is satisfactory She will continue chronic thyroid replacement therapy We will adjust her thyroid medicine as needed 

## 2018-08-31 NOTE — Assessment & Plan Note (Signed)
She continues to have mild chronic constipation She will continue stool softener and laxatives as needed

## 2018-09-10 ENCOUNTER — Telehealth: Payer: Self-pay | Admitting: Hematology and Oncology

## 2018-09-10 ENCOUNTER — Telehealth: Payer: Self-pay

## 2018-09-10 NOTE — Telephone Encounter (Signed)
Scheduled appt per 3/5 sch message - left message for patient with appt date and time.  

## 2018-09-10 NOTE — Telephone Encounter (Signed)
Called and given below message. She verbalized understanding. Scheduling message sent to move lab and flush prior to scan on 31/3.

## 2018-09-10 NOTE — Telephone Encounter (Signed)
-----   Message from Heath Lark, MD sent at 09/10/2018  7:51 AM EST ----- Regarding: move labs and flush appt Pls call her and recommend moving her labs and flush appt to Friday 1 hour before CT and cancel the labs/flush on 3/16

## 2018-09-18 ENCOUNTER — Ambulatory Visit (HOSPITAL_COMMUNITY)
Admission: RE | Admit: 2018-09-18 | Discharge: 2018-09-18 | Disposition: A | Discharge status: Home / Self Care | Payer: Medicare Other | Source: Ambulatory Visit | Attending: Hematology and Oncology | Admitting: Hematology and Oncology

## 2018-09-18 ENCOUNTER — Telehealth: Payer: Self-pay

## 2018-09-18 ENCOUNTER — Encounter (HOSPITAL_COMMUNITY): Payer: Self-pay

## 2018-09-18 ENCOUNTER — Inpatient Hospital Stay: Payer: Medicare Other

## 2018-09-18 ENCOUNTER — Other Ambulatory Visit: Payer: Self-pay

## 2018-09-18 ENCOUNTER — Inpatient Hospital Stay: Payer: Medicare Other | Attending: Hematology and Oncology

## 2018-09-18 DIAGNOSIS — C541 Malignant neoplasm of endometrium: Secondary | ICD-10-CM

## 2018-09-18 DIAGNOSIS — E039 Hypothyroidism, unspecified: Secondary | ICD-10-CM

## 2018-09-18 DIAGNOSIS — C787 Secondary malignant neoplasm of liver and intrahepatic bile duct: Secondary | ICD-10-CM

## 2018-09-18 DIAGNOSIS — Z5112 Encounter for antineoplastic immunotherapy: Secondary | ICD-10-CM | POA: Diagnosis not present

## 2018-09-18 DIAGNOSIS — Z79899 Other long term (current) drug therapy: Secondary | ICD-10-CM | POA: Diagnosis not present

## 2018-09-18 HISTORY — DX: Secondary malignant neoplasm of liver and intrahepatic bile duct: C78.7

## 2018-09-18 LAB — TSH: TSH: 5.379 u[IU]/mL — ABNORMAL HIGH (ref 0.308–3.960)

## 2018-09-18 LAB — CBC WITH DIFFERENTIAL (CANCER CENTER ONLY)
Abs Immature Granulocytes: 0.03 10*3/uL (ref 0.00–0.07)
BASOS PCT: 1 %
Basophils Absolute: 0 10*3/uL (ref 0.0–0.1)
Eosinophils Absolute: 0.3 10*3/uL (ref 0.0–0.5)
Eosinophils Relative: 4 %
HCT: 37 % (ref 36.0–46.0)
Hemoglobin: 12 g/dL (ref 12.0–15.0)
Immature Granulocytes: 0 %
Lymphocytes Relative: 17 %
Lymphs Abs: 1.2 10*3/uL (ref 0.7–4.0)
MCH: 29.2 pg (ref 26.0–34.0)
MCHC: 32.4 g/dL (ref 30.0–36.0)
MCV: 90 fL (ref 80.0–100.0)
Monocytes Absolute: 0.5 10*3/uL (ref 0.1–1.0)
Monocytes Relative: 7 %
NEUTROS ABS: 5 10*3/uL (ref 1.7–7.7)
Neutrophils Relative %: 71 %
Platelet Count: 210 10*3/uL (ref 150–400)
RBC: 4.11 MIL/uL (ref 3.87–5.11)
RDW: 13.3 % (ref 11.5–15.5)
WBC Count: 7 10*3/uL (ref 4.0–10.5)
nRBC: 0 % (ref 0.0–0.2)

## 2018-09-18 LAB — CMP (CANCER CENTER ONLY)
ALT: 19 U/L (ref 0–44)
AST: 19 U/L (ref 15–41)
Albumin: 3.4 g/dL — ABNORMAL LOW (ref 3.5–5.0)
Alkaline Phosphatase: 124 U/L (ref 38–126)
Anion gap: 9 (ref 5–15)
BUN: 25 mg/dL — ABNORMAL HIGH (ref 8–23)
CO2: 26 mmol/L (ref 22–32)
Calcium: 9.2 mg/dL (ref 8.9–10.3)
Chloride: 105 mmol/L (ref 98–111)
Creatinine: 0.81 mg/dL (ref 0.44–1.00)
GFR, Est AFR Am: >60 mL/min (ref >60)
GFR, Estimated: >60 mL/min (ref >60)
Glucose, Bld: 69 mg/dL — ABNORMAL LOW (ref 70–99)
Potassium: 4 mmol/L (ref 3.5–5.1)
Sodium: 140 mmol/L (ref 135–145)
TOTAL PROTEIN: 6.8 g/dL (ref 6.5–8.1)
Total Bilirubin: 0.6 mg/dL (ref 0.3–1.2)

## 2018-09-18 MED ORDER — IOHEXOL 300 MG/ML  SOLN
100.0000 mL | Freq: Once | INTRAMUSCULAR | Status: AC | PRN
  Administered 2018-09-18: 100 mL via INTRAVENOUS

## 2018-09-18 MED ORDER — HEPARIN SOD (PORK) LOCK FLUSH 100 UNIT/ML IV SOLN
500.0000 [IU] | Freq: Once | INTRAVENOUS | Status: AC
  Administered 2018-09-18: 500 [IU] via INTRAVENOUS

## 2018-09-18 MED ORDER — HEPARIN SOD (PORK) LOCK FLUSH 100 UNIT/ML IV SOLN
INTRAVENOUS | Status: AC
  Filled 2018-09-18: qty 5

## 2018-09-18 MED ORDER — SODIUM CHLORIDE (PF) 0.9 % IJ SOLN
INTRAMUSCULAR | Status: AC
  Filled 2018-09-18: qty 50

## 2018-09-18 MED ORDER — SODIUM CHLORIDE 0.9% FLUSH
10.0000 mL | Freq: Once | INTRAVENOUS | Status: AC
  Administered 2018-09-18: 10 mL
  Filled 2018-09-18: qty 10

## 2018-09-18 NOTE — Telephone Encounter (Signed)
-----   Message from Heath Lark, MD sent at 09/18/2018  2:45 PM EDT ----- Regarding: TSH better Pls let her know TSH better no need dose adjustment

## 2018-09-21 ENCOUNTER — Inpatient Hospital Stay: Payer: Medicare Other

## 2018-09-21 ENCOUNTER — Other Ambulatory Visit: Payer: Medicare Other

## 2018-09-21 ENCOUNTER — Telehealth: Payer: Self-pay | Admitting: Hematology and Oncology

## 2018-09-21 ENCOUNTER — Inpatient Hospital Stay (HOSPITAL_BASED_OUTPATIENT_CLINIC_OR_DEPARTMENT_OTHER): Payer: Medicare Other | Admitting: Hematology and Oncology

## 2018-09-21 ENCOUNTER — Other Ambulatory Visit: Payer: Self-pay

## 2018-09-21 ENCOUNTER — Encounter: Payer: Self-pay | Admitting: Hematology and Oncology

## 2018-09-21 DIAGNOSIS — C787 Secondary malignant neoplasm of liver and intrahepatic bile duct: Secondary | ICD-10-CM | POA: Diagnosis not present

## 2018-09-21 DIAGNOSIS — Z7189 Other specified counseling: Secondary | ICD-10-CM

## 2018-09-21 DIAGNOSIS — C541 Malignant neoplasm of endometrium: Secondary | ICD-10-CM

## 2018-09-21 DIAGNOSIS — G2581 Restless legs syndrome: Secondary | ICD-10-CM | POA: Diagnosis not present

## 2018-09-21 DIAGNOSIS — E039 Hypothyroidism, unspecified: Secondary | ICD-10-CM | POA: Diagnosis not present

## 2018-09-21 DIAGNOSIS — Z5112 Encounter for antineoplastic immunotherapy: Secondary | ICD-10-CM | POA: Diagnosis not present

## 2018-09-21 DIAGNOSIS — G4701 Insomnia due to medical condition: Secondary | ICD-10-CM

## 2018-09-21 MED ORDER — SODIUM CHLORIDE 0.9 % IV SOLN
Freq: Once | INTRAVENOUS | Status: AC
  Administered 2018-09-21: 15:00:00 via INTRAVENOUS
  Filled 2018-09-21: qty 250

## 2018-09-21 MED ORDER — HEPARIN SOD (PORK) LOCK FLUSH 100 UNIT/ML IV SOLN
500.0000 [IU] | Freq: Once | INTRAVENOUS | Status: AC | PRN
  Administered 2018-09-21: 500 [IU]
  Filled 2018-09-21: qty 5

## 2018-09-21 MED ORDER — SODIUM CHLORIDE 0.9 % IV SOLN
200.0000 mg | Freq: Once | INTRAVENOUS | Status: AC
  Administered 2018-09-21: 200 mg via INTRAVENOUS
  Filled 2018-09-21: qty 8

## 2018-09-21 MED ORDER — SODIUM CHLORIDE 0.9% FLUSH
10.0000 mL | INTRAVENOUS | Status: DC | PRN
  Administered 2018-09-21: 10 mL
  Filled 2018-09-21: qty 10

## 2018-09-21 NOTE — Assessment & Plan Note (Signed)
I have reviewed her recent CT imaging and blood work Even though the radiologist thought that the disease in the left pelvis looked worse, by my own reading, it actually looks better The patient and her sister agreed We will proceed with treatment as scheduled for 3 to 4 months with plan to repeat imaging study in June She continues to feel well without any symptom

## 2018-09-21 NOTE — Assessment & Plan Note (Signed)
Her recent TSH is satisfactory She will continue chronic thyroid replacement therapy We will adjust her thyroid medicine as needed 

## 2018-09-21 NOTE — Patient Instructions (Signed)
[facility] Discharge Instructions for Patients Receiving Chemotherapy  Today you received the following chemotherapy agents:  Keytruda.  To help prevent nausea and vomiting after your treatment, we encourage you to take your nausea medication as directed.   If you develop nausea and vomiting that is not controlled by your nausea medication, call the clinic.   BELOW ARE SYMPTOMS THAT SHOULD BE REPORTED IMMEDIATELY:  *FEVER GREATER THAN 100.5 F  *CHILLS WITH OR WITHOUT FEVER  NAUSEA AND VOMITING THAT IS NOT CONTROLLED WITH YOUR NAUSEA MEDICATION  *UNUSUAL SHORTNESS OF BREATH  *UNUSUAL BRUISING OR BLEEDING  TENDERNESS IN MOUTH AND THROAT WITH OR WITHOUT PRESENCE OF ULCERS  *URINARY PROBLEMS  *BOWEL PROBLEMS  UNUSUAL RASH Items with * indicate a potential emergency and should be followed up as soon as possible.  Feel free to call the clinic should you have any questions or concerns. The clinic phone number is [phone].  Please show the CHEMO ALERT CARD at check-in to the Emergency Department and triage nurse.    

## 2018-09-21 NOTE — Progress Notes (Signed)
[place] OFFICE PROGRESS NOTE  Patient Care Team: [name], MD as PCP - General (Family Medicine)  ASSESSMENT & PLAN:  Recurrent carcinoma of endometrium [facility]) I have reviewed her recent CT imaging and blood work Even though the radiologist thought that the disease in the left pelvis looked worse, by my own reading, it actually looks better The patient and her sister agreed We will proceed with treatment as scheduled for 3 to 4 months with plan to repeat imaging study in June She continues to feel well without any symptom  Acquired hypothyroidism Her recent TSH is satisfactory She will continue chronic thyroid replacement therapy We will adjust her thyroid medicine as needed  Metastasis to liver (HCC) Overall, the liver metastasis are improving Her liver enzymes are now normal We will continue CT imaging every 3 to 4 months  Restless leg syndrome I recommend discontinuation of all the other benzodiazepines except for lorazepam. I recommend magnesium supplement to help with restless leg  Insomnia disorder She has chronic insomnia and has taken benzodiazepines in the past to help with her sleep She will continue melatonin and trazodone and occasional benzodiazepine as needed  Goals of care, counseling/discussion We have extensive discussion about goals of care She understood treatment goal is strictly palliative in nature Overall, she is responding to treatment and we will continue pembrolizumab indefinitely   No orders of the defined types were placed in this encounter.   INTERVAL HISTORY: Please see below for problem oriented charting. She returns with her sister for further follow-up Since last time I saw her, she has excellent appetite and has gained some weight She denies abdominal pain, vaginal bleeding or bloating She has weaned herself off all pain medicine She continues to have chronic restless leg syndrome and insomnia which she takes  occasional benzodiazepine and magnesium supplement She tolerated treatment well without any signs of infusion reaction  SUMMARY OF ONCOLOGIC HISTORY: Oncology History   MSI stable on June 2017 tissue but high from [facility] study from November 2017  05/15/16: ER is moderately positive (70%). PR is strongly positive (80%).     Recurrent carcinoma of endometrium ([place])   01/02/2016 Pathology Results    Uterus +/- tubes/ovaries, neoplastic, cervix ENDOMETRIAL ADENOCARCINOMA, FIGO GRADE 2 (4.7 CM) THE TUMOR INVADES LESS THAN ONE-HALF OF THE MYOMETRIUM (PT1A) ALL MARGINS OF RESECTION ARE NEGATIVE FOR CARCINOMA LEIOMYOMAS AND ADENOMYOSIS BILATERAL FALLOPIAN TUBES AND OVARIES: HISTOLOGICAL UNREMARKABLE 2. Lymph node, sentinel, biopsy, right obturator ONE BENIGN LYMPH NODE (0/1) 3. Lymph nodes, regional resection, left pelvic FOUR BENIGN LYMPH NODES (0/4)    01/02/2016 Surgery    Dr. Denman George performed robotic-assisted laparoscopic total hysterectomy with bilateral salpingoophorectomy, sentinel lymph node biopsy, lymphadenectomy      05/15/2016 Pathology Results    Vagina, biopsy, mid - ADENOCARCINOMA, SEE COMMENT. Microscopic Comment The morphology along with the patient's history are consistent with recurrent endometrioid adenocarcinoma. The carcinoma has a similar appearance to the primary ([id number]).    05/20/2016 Imaging    Ct scan abdomen showed solid 2.5 cm peritoneal mass in the mid to anterior left pelvis, suspicious for peritoneal metastasis. 2. Small expansile low-attenuation filling defect in the left external iliac vein, cannot exclude a small deep venous thrombus. Consider correlation with left lower extremity venous Doppler scan. 3. Small simple fluid density structure in the left pelvic sidewall abutting the left external iliac vessels, favor a small postoperative seroma. 4. No ascites. 5. No lymphadenopathy.  No metastatic disease in the  chest. 6. Aortic atherosclerosis.     06/12/2016 PET scan    Intensely hypermetabolic 2.1 cm central pelvic peritoneal mass just to the left of midline, consistent with peritoneal metastatic recurrence. No ascites. 2. No additional hypermetabolic sites of metastatic disease. 3. Diffuse thyroid hypermetabolism without discrete thyroid nodule, favoring thyroiditis. Recommend correlation with serum thyroid function tests.    06/24/2016 - 07/22/2016 Chemotherapy    The patient had weekly cisplatin. She has missed several doses due to infection and pancytopenia    06/24/2016 - 09/04/2016 Radiation Therapy    She completed concurrent radiation therapy Radiation treatment dates:   IMRT : 06/24/16 - 08/01/16 HDR : 08/13/16, 08/20/16, 08/27/16, 09/04/16  Site/dose:   Pelvis treated to 55 Gy in 25 fractions (simultaneous integrated boost technique) Vaginal Cuff treated to 24 Gy in 4 fractions    08/15/2016 - 12/03/2016 Chemotherapy    She received 6 cycles of carboplatin/Taxol    09/27/2016 Imaging    Interval decrease in size of previously described solid peritoneal nodule within the left anterior pelvis. Near complete resolution of previously described low-attenuation structure along the left pelvic sidewall. No evidence for metastatic disease in the chest. Aortic atherosclerosis.    12/30/2016 Imaging    Ct abdomen 1. Solitary left pelvic peritoneal implant is mildly decreased in size in the interval. 2. No new or progressive metastatic disease in the abdomen or pelvis. No ascites. 3. Aortic atherosclerosis.    04/02/2017 Imaging    Left lower quadrant peritoneal implant referenced on previous exam measures 1.7 x 1.3 cm, image 69 of series 2. Increased from 0.8 x 0.8 cm previously peer no new peritoneal implants identified.  Musculoskeletal: The degenerative disc disease noted within the lumbar spine.  IMPRESSION: 1. Solitary left pelvic peritoneal implant is increased in size in the interval. 2. No new sites of disease.  No  ascites. 3. Aortic atherosclerosis    04/11/2017 PET scan    1. The left side of pelvis peritoneal implant has decreased in size and degree of FDG uptake compatible with response to therapy. No new areas of peritoneal disease identified. 2. Persistent diffuse increased uptake within the thyroid gland. Correlation with patient's thyroid function may be helpful.    05/26/2017 Imaging    1. Enlarging tumor implant along the left adnexa, currently 2.6 by 2.4 cm and previously 1.9 by 1.3 cm. No new tumor implant or other specific cause for the patient's pelvic symptoms is currently identified. 2.  Aortic Atherosclerosis (ICD10-I70.0). 3. Lumbar spondylosis and degenerative disc disease causing multilevel impingement.    06/04/2017 - 08/28/2017 Chemotherapy    The patient started letrozole and everolimus    08/26/2017 Imaging    Increased size of mass in the left adnexal region.  Several new small liver metastases in right hepatic lobe    09/08/2017 Imaging    LV EF: 60% -  65%    09/10/2017 Procedure    Technically successful right IJ power-injectable port catheter placement. Ready for routine use    12/04/2017 Imaging    1. Interval increase in size and number of multiple lesions within the liver compatible with hepatic metastatic disease. 2. Interval increase in size mass within the left hemipelvis.    12/15/2017 -  Chemotherapy    The patient had pembrolizumab (KEYTRUDA) 200 mg in sodium chloride 0.9 % 50 mL chemo infusion, 200 mg, Intravenous, Once, 1 of 6 cycles Administration: 200 mg (12/15/2017)  for chemotherapy treatment.     12/21/2017 Imaging  1. Moderate left hydronephrosis and hydroureter, similar compared to most recent CT from May 2019; obstruction appears to be secondary to a left pelvic mass/metastatic focus which has increased in size since the prior CT. 2. Numerous hepatic metastatic lesions, suspect that some may be increased in size but difficult to further characterize  without intravenous contrast. Increased size of left pelvic soft tissue mass/metastatic lesion. Mass abuts and possibly invades the sigmoid colon.     02/20/2018 Imaging    Interval decrease in hepatic metastases.  Decreased mass or lymphadenopathy in the left external iliac chain. Interval resolution of left hydroureteronephrosis.  No new or progressive disease within the abdomen or pelvis.    05/16/2018 Imaging    05/16/2018 CT Abdomen IMPRESSION: 1. Slight interval decrease in size of hepatic metastatic disease. 2. Slight interval decrease in size centrally necrotic mass within the left external iliac region.    09/18/2018 Imaging    1. Today's study demonstrates a mixed response to therapy. Specifically, while the previously noted hepatic metastases appear decreased, the soft tissue mass along the left pelvic sidewall appears slightly larger than the prior study. No new metastatic disease is noted elsewhere in the abdomen or pelvis. 2. Aortic atherosclerosis. 3. Mild cardiomegaly. 4. Additional incidental findings, as above      REVIEW OF SYSTEMS:   Constitutional: Denies fevers, chills or abnormal weight loss Eyes: Denies blurriness of vision Ears, nose, mouth, throat, and face: Denies mucositis or sore throat Respiratory: Denies cough, dyspnea or wheezes Cardiovascular: Denies palpitation, chest discomfort or lower extremity swelling Gastrointestinal:  Denies nausea, heartburn or change in bowel habits Skin: Denies abnormal skin rashes Lymphatics: Denies new lymphadenopathy or easy bruising Neurological:Denies numbness, tingling or new weaknesses Behavioral/Psych: Mood is stable, no new changes  All other systems were reviewed with the patient and are negative.  I have reviewed the past medical history, past surgical history, social history and family history with the patient and they are unchanged from previous note.  ALLERGIES:  is allergic to latex.  MEDICATIONS:   Current Outpatient Medications  Medication Sig Dispense Refill  . acetaminophen (TYLENOL) 325 MG tablet Take 650 mg by mouth every 6 (six) hours as needed for mild pain or moderate pain.     Marland Kitchen amoxicillin (AMOXIL) 500 MG capsule Take 2,000 mg by mouth See admin instructions. Prior to dental appointment, takes 4 tables prior to appt    . Biotin w/ Vitamins C & E (HAIR/SKIN/NAILS PO) Take 1 tablet by mouth daily.    . Calcium Carb-Cholecalciferol (CALCIUM 600 + D PO) Take 2 tablets by mouth daily.    . fluticasone (FLONASE) 50 MCG/ACT nasal spray Place 1 spray into both nostrils 2 (two) times daily as needed for allergies.   1  . gabapentin (NEURONTIN) 600 MG tablet TAKE 1 TABLET BY MOUTH TWICE A DAY (Patient taking differently: Take 1 tablet 2 times a day) 60 tablet 9  . gabapentin (NEURONTIN) 600 MG tablet TAKE 1 TABLET BY MOUTH TWICE A DAY FOR RESTLESS LEGS AND NEUROPATHY.    Marland Kitchen ibuprofen (ADVIL,MOTRIN) 200 MG tablet Take 400 mg by mouth every 8 (eight) hours as needed for mild pain.    Marland Kitchen levothyroxine (SYNTHROID, LEVOTHROID) 175 MCG tablet Take 1 tablet (175 mcg total) by mouth daily before breakfast. 30 tablet 2  . loratadine (CLARITIN) 10 MG tablet Take 10 mg by mouth daily.    Marland Kitchen LORazepam (ATIVAN) 1 MG tablet TAKE 1 TABLET BY MOUTH EVERY 8 HOURS AS NEEDED  FOR ANXIETY OR SLEEP 90 tablet 0  . losartan-hydrochlorothiazide (HYZAAR) 100-12.5 MG tablet Take 0.5 tablets by mouth daily. 60 tablet 9  . Magnesium 250 MG TABS Take 250 mg by mouth daily.    . magnesium oxide (MAG-OX) 400 (241.3 Mg) MG tablet Take 1 tablet (400 mg total) by mouth 2 (two) times daily. 60 tablet 9  . magnesium oxide (MAG-OX) 400 MG tablet Take 1 tablet by mouth 2 (two) times daily.    . Melatonin 10 MG CAPS Take 10 mg by mouth at bedtime.    . ondansetron (ZOFRAN) 8 MG tablet Take 1 tablet (8 mg total) by mouth every 8 (eight) hours as needed for nausea. 60 tablet 1  . potassium gluconate 595 (99 K) MG TABS tablet Take by  mouth.    . traZODone (DESYREL) 50 MG tablet Take 1.5 tablets (75 mg total) by mouth at bedtime. (Patient taking differently: Take 50 mg by mouth at bedtime. ) 135 tablet 1   No current facility-administered medications for this visit.     PHYSICAL EXAMINATION: ECOG PERFORMANCE STATUS: 1 - Symptomatic but completely ambulatory  Vitals:   09/21/18 1353  BP: 138/60  Pulse: 63  Resp: 18  SpO2: 100%   Filed Weights   09/21/18 1353  Weight: 198 lb 9.6 oz (90.1 kg)    GENERAL:alert, no distress and comfortable SKIN: skin color, texture, turgor are normal, no rashes or significant lesions EYES: normal, Conjunctiva are pink and non-injected, sclera clear OROPHARYNX:no exudate, no erythema and lips, buccal mucosa, and tongue normal  NECK: supple, thyroid normal size, non-tender, without nodularity LYMPH:  no palpable lymphadenopathy in the cervical, axillary or inguinal LUNGS: clear to auscultation and percussion with normal breathing effort HEART: regular rate & rhythm and no murmurs and no lower extremity edema ABDOMEN:abdomen soft, non-tender and normal bowel sounds Musculoskeletal:no cyanosis of digits and no clubbing  NEURO: alert & oriented x 3 with fluent speech, no focal motor/sensory deficits  LABORATORY DATA:  I have reviewed the data as listed    Component Value Date/Time   NA 140 09/18/2018 1152   NA 141 07/09/2017 1312   K 4.0 09/18/2018 1152   K 3.9 07/09/2017 1312   CL 105 09/18/2018 1152   CO2 26 09/18/2018 1152   CO2 27 07/09/2017 1312   GLUCOSE 69 (L) 09/18/2018 1152   GLUCOSE 109 07/09/2017 1312   BUN 25 (H) 09/18/2018 1152   BUN 16.4 07/09/2017 1312   CREATININE 0.81 09/18/2018 1152   CREATININE 0.8 07/09/2017 1312   CALCIUM 9.2 09/18/2018 1152   CALCIUM 9.2 07/09/2017 1312   PROT 6.8 09/18/2018 1152   PROT 7.1 07/09/2017 1312   ALBUMIN 3.4 (L) 09/18/2018 1152   ALBUMIN 3.1 (L) 07/09/2017 1312   AST 19 09/18/2018 1152   AST 35 (H) 07/09/2017 1312    ALT 19 09/18/2018 1152   ALT 43 07/09/2017 1312   ALKPHOS 124 09/18/2018 1152   ALKPHOS 182 (H) 07/09/2017 1312   BILITOT 0.6 09/18/2018 1152   BILITOT 0.34 07/09/2017 1312   GFRNONAA >60 09/18/2018 1152   GFRAA >60 09/18/2018 1152    No results found for: SPEP, UPEP  Lab Results  Component Value Date   WBC 7.0 09/18/2018   NEUTROABS 5.0 09/18/2018   HGB 12.0 09/18/2018   HCT 37.0 09/18/2018   MCV 90.0 09/18/2018   PLT 210 09/18/2018      Chemistry      Component Value Date/Time   NA 140  09/18/2018 1152   NA 141 07/09/2017 1312   K 4.0 09/18/2018 1152   K 3.9 07/09/2017 1312   CL 105 09/18/2018 1152   CO2 26 09/18/2018 1152   CO2 27 07/09/2017 1312   BUN 25 (H) 09/18/2018 1152   BUN 16.4 07/09/2017 1312   CREATININE 0.81 09/18/2018 1152   CREATININE 0.8 07/09/2017 1312      Component Value Date/Time   CALCIUM 9.2 09/18/2018 1152   CALCIUM 9.2 07/09/2017 1312   ALKPHOS 124 09/18/2018 1152   ALKPHOS 182 (H) 07/09/2017 1312   AST 19 09/18/2018 1152   AST 35 (H) 07/09/2017 1312   ALT 19 09/18/2018 1152   ALT 43 07/09/2017 1312   BILITOT 0.6 09/18/2018 1152   BILITOT 0.34 07/09/2017 1312       RADIOGRAPHIC STUDIES: I have reviewed multiple imaging studies with the patient and her sister I have personally reviewed the radiological images as listed and agreed with the findings in the report. Ct Abdomen Pelvis W Contrast  Result Date: 09/18/2018 CLINICAL DATA:  73 year old female with history of endometrial cancer with metastatic disease to the liver. EXAM: CT ABDOMEN AND PELVIS WITH CONTRAST TECHNIQUE: Multidetector CT imaging of the abdomen and pelvis was performed using the standard protocol following bolus administration of intravenous contrast. CONTRAST:  131m OMNIPAQUE IOHEXOL 300 MG/ML  SOLN COMPARISON:  CT the abdomen and pelvis 05/15/2018. FINDINGS: Lower chest: Mild cardiomegaly. Calcifications of the mitral annulus. Hepatobiliary: Previously noted  metastatic lesions in the liver appears smaller, with the largest residual metastatic lesion in segment 6 currently measuring 1.6 x 0.8 cm (axial image 27 of series 2) as compared with 1.7 x 1.4 cm on 05/15/2018. Well-defined 6 mm low-attenuation lesion in segment 8 of the liver, too small to characterize, but stable compared to prior studies and statistically likely a small cyst. No new hepatic lesions are otherwise noted. No intra or extrahepatic biliary ductal dilatation. Gallbladder is normal in appearance. Pancreas: No pancreatic mass. No pancreatic ductal dilatation. No pancreatic or peripancreatic fluid or inflammatory changes. Spleen: Unremarkable. Adrenals/Urinary Tract: Bilateral kidneys and bilateral adrenal glands are normal in appearance. No hydroureteronephrosis. Urinary bladder is normal in appearance. Stomach/Bowel: Normal appearance of the stomach. No pathologic dilatation of small bowel or colon. Normal appendix. Vascular/Lymphatic: Aortic atherosclerosis, without evidence of aneurysm or dissection in the abdominal or pelvic vasculature. No lymphadenopathy noted in the abdomen or pelvis. Reproductive: Status post total abdominal hysterectomy and bilateral salpingo-oophorectomy. Other: Along the left pelvic sidewall there is again a heterogeneously enhancing soft tissue mass which currently measures 5.2 x 4.0 x 3.6 cm (axial image 67 of series 2 and coronal image 68 of series 5), which previously measured 4.1 x 3.2 cm (CT abdomen and pelvis 05/15/2018). This is intimately associated with the adjacent external iliac artery and vein, and appears adherent to an adjacent portion of the mid sigmoid colon. No significant volume of ascites. No pneumoperitoneum. Musculoskeletal: There are no aggressive appearing lytic or blastic lesions noted in the visualized portions of the skeleton. IMPRESSION: 1. Today's study demonstrates a mixed response to therapy. Specifically, while the previously noted hepatic  metastases appear decreased, the soft tissue mass along the left pelvic sidewall appears slightly larger than the prior study. No new metastatic disease is noted elsewhere in the abdomen or pelvis. 2. Aortic atherosclerosis. 3. Mild cardiomegaly. 4. Additional incidental findings, as above Electronically Signed   By: DVinnie LangtonM.D.   On: 09/18/2018 15:12    All questions  were answered. The patient knows to call the clinic with any problems, questions or concerns. No barriers to learning was detected.  I spent 30 minutes counseling the patient face to face. The total time spent in the appointment was 40 minutes and more than 50% was on counseling and review of test results  Heath Lark, MD 09/21/2018 2:25 PM

## 2018-09-21 NOTE — Assessment & Plan Note (Signed)
I recommend discontinuation of all the other benzodiazepines except for lorazepam. I recommend magnesium supplement to help with restless leg

## 2018-09-21 NOTE — Telephone Encounter (Signed)
Gave avs and calendar ° °

## 2018-09-21 NOTE — Assessment & Plan Note (Signed)
She has chronic insomnia and has taken benzodiazepines in the past to help with her sleep She will continue melatonin and trazodone and occasional benzodiazepine as needed

## 2018-09-21 NOTE — Assessment & Plan Note (Signed)
We have extensive discussion about goals of care She understood treatment goal is strictly palliative in nature Overall, she is responding to treatment and we will continue pembrolizumab indefinitely

## 2018-09-21 NOTE — Assessment & Plan Note (Signed)
Overall, the liver metastasis are improving Her liver enzymes are now normal We will continue CT imaging every 3 to 4 months

## 2018-10-12 ENCOUNTER — Other Ambulatory Visit: Payer: Self-pay

## 2018-10-12 ENCOUNTER — Inpatient Hospital Stay: Payer: Medicare Other

## 2018-10-12 ENCOUNTER — Inpatient Hospital Stay: Payer: Medicare Other | Attending: Hematology and Oncology

## 2018-10-12 VITALS — BP 148/71 | HR 63 | Temp 98.4°F | Resp 16

## 2018-10-12 DIAGNOSIS — Z79899 Other long term (current) drug therapy: Secondary | ICD-10-CM | POA: Insufficient documentation

## 2018-10-12 DIAGNOSIS — E039 Hypothyroidism, unspecified: Secondary | ICD-10-CM

## 2018-10-12 DIAGNOSIS — Z5112 Encounter for antineoplastic immunotherapy: Secondary | ICD-10-CM | POA: Insufficient documentation

## 2018-10-12 DIAGNOSIS — C787 Secondary malignant neoplasm of liver and intrahepatic bile duct: Secondary | ICD-10-CM | POA: Diagnosis not present

## 2018-10-12 DIAGNOSIS — C541 Malignant neoplasm of endometrium: Secondary | ICD-10-CM | POA: Diagnosis present

## 2018-10-12 LAB — CMP (CANCER CENTER ONLY)
ALT: 18 U/L (ref 0–44)
AST: 20 U/L (ref 15–41)
Albumin: 3.4 g/dL — ABNORMAL LOW (ref 3.5–5.0)
Alkaline Phosphatase: 104 U/L (ref 38–126)
Anion gap: 8 (ref 5–15)
BUN: 23 mg/dL (ref 8–23)
CO2: 26 mmol/L (ref 22–32)
Calcium: 9 mg/dL (ref 8.9–10.3)
Chloride: 103 mmol/L (ref 98–111)
Creatinine: 0.84 mg/dL (ref 0.44–1.00)
GFR, Est AFR Am: >60 mL/min (ref >60)
GFR, Estimated: >60 mL/min (ref >60)
Glucose, Bld: 83 mg/dL (ref 70–99)
Potassium: 4.2 mmol/L (ref 3.5–5.1)
Sodium: 137 mmol/L (ref 135–145)
Total Bilirubin: 0.5 mg/dL (ref 0.3–1.2)
Total Protein: 6.6 g/dL (ref 6.5–8.1)

## 2018-10-12 LAB — CBC WITH DIFFERENTIAL (CANCER CENTER ONLY)
Abs Immature Granulocytes: 0.02 10*3/uL (ref 0.00–0.07)
Basophils Absolute: 0.1 10*3/uL (ref 0.0–0.1)
Basophils Relative: 1 %
Eosinophils Absolute: 0.2 10*3/uL (ref 0.0–0.5)
Eosinophils Relative: 3 %
HCT: 35.1 % — ABNORMAL LOW (ref 36.0–46.0)
Hemoglobin: 11.6 g/dL — ABNORMAL LOW (ref 12.0–15.0)
Immature Granulocytes: 0 %
Lymphocytes Relative: 18 %
Lymphs Abs: 1.4 10*3/uL (ref 0.7–4.0)
MCH: 29.7 pg (ref 26.0–34.0)
MCHC: 33 g/dL (ref 30.0–36.0)
MCV: 90 fL (ref 80.0–100.0)
Monocytes Absolute: 0.4 10*3/uL (ref 0.1–1.0)
Monocytes Relative: 6 %
Neutro Abs: 5.3 10*3/uL (ref 1.7–7.7)
Neutrophils Relative %: 72 %
Platelet Count: 176 10*3/uL (ref 150–400)
RBC: 3.9 MIL/uL (ref 3.87–5.11)
RDW: 13.2 % (ref 11.5–15.5)
WBC Count: 7.4 10*3/uL (ref 4.0–10.5)
nRBC: 0 % (ref 0.0–0.2)

## 2018-10-12 LAB — TSH: TSH: 4.571 u[IU]/mL — ABNORMAL HIGH (ref 0.308–3.960)

## 2018-10-12 MED ORDER — HEPARIN SOD (PORK) LOCK FLUSH 100 UNIT/ML IV SOLN
500.0000 [IU] | Freq: Once | INTRAVENOUS | Status: DC | PRN
  Filled 2018-10-12: qty 5

## 2018-10-12 MED ORDER — SODIUM CHLORIDE 0.9% FLUSH
10.0000 mL | Freq: Once | INTRAVENOUS | Status: AC
  Administered 2018-10-12: 10 mL
  Filled 2018-10-12: qty 10

## 2018-10-12 MED ORDER — SODIUM CHLORIDE 0.9 % IV SOLN
200.0000 mg | Freq: Once | INTRAVENOUS | Status: AC
  Administered 2018-10-12: 200 mg via INTRAVENOUS
  Filled 2018-10-12: qty 8

## 2018-10-12 MED ORDER — SODIUM CHLORIDE 0.9% FLUSH
10.0000 mL | INTRAVENOUS | Status: DC | PRN
  Filled 2018-10-12: qty 10

## 2018-10-12 MED ORDER — SODIUM CHLORIDE 0.9 % IV SOLN
Freq: Once | INTRAVENOUS | Status: AC
  Administered 2018-10-12: 15:00:00 via INTRAVENOUS
  Filled 2018-10-12: qty 250

## 2018-10-12 NOTE — Patient Instructions (Signed)
[facility] Discharge Instructions for Patients Receiving Chemotherapy  Today you received the following chemotherapy agents:  Keytruda.  To help prevent nausea and vomiting after your treatment, we encourage you to take your nausea medication as directed.   If you develop nausea and vomiting that is not controlled by your nausea medication, call the clinic.   BELOW ARE SYMPTOMS THAT SHOULD BE REPORTED IMMEDIATELY:  *FEVER GREATER THAN 100.5 F  *CHILLS WITH OR WITHOUT FEVER  NAUSEA AND VOMITING THAT IS NOT CONTROLLED WITH YOUR NAUSEA MEDICATION  *UNUSUAL SHORTNESS OF BREATH  *UNUSUAL BRUISING OR BLEEDING  TENDERNESS IN MOUTH AND THROAT WITH OR WITHOUT PRESENCE OF ULCERS  *URINARY PROBLEMS  *BOWEL PROBLEMS  UNUSUAL RASH Items with * indicate a potential emergency and should be followed up as soon as possible.  Feel free to call the clinic should you have any questions or concerns. The clinic phone number is [phone].  Please show the CHEMO ALERT CARD at check-in to the Emergency Department and triage nurse.    

## 2018-10-30 ENCOUNTER — Telehealth: Payer: Self-pay

## 2018-10-30 NOTE — Telephone Encounter (Signed)
She called and left a message to call her.  Called back. She is having gum/ tongue tenderness and soreness. She is drinking a 1/2 beer nightly with salt and tomato juice for the last couple of months. Instructed to stop the salt and tomato juice nightly.  Rinse mouth with salt and water prn and stop any alcohol mouth wash. She could try biotene mouthwash. She verbalized understanding.

## 2018-11-02 ENCOUNTER — Inpatient Hospital Stay: Payer: Medicare Other

## 2018-11-02 ENCOUNTER — Inpatient Hospital Stay (HOSPITAL_BASED_OUTPATIENT_CLINIC_OR_DEPARTMENT_OTHER): Payer: Medicare Other | Admitting: Hematology and Oncology

## 2018-11-02 ENCOUNTER — Other Ambulatory Visit: Payer: Self-pay

## 2018-11-02 VITALS — BP 135/74 | HR 75 | Temp 98.3°F | Resp 18 | Ht 65.5 in | Wt 205.5 lb

## 2018-11-02 DIAGNOSIS — C541 Malignant neoplasm of endometrium: Secondary | ICD-10-CM

## 2018-11-02 DIAGNOSIS — E039 Hypothyroidism, unspecified: Secondary | ICD-10-CM

## 2018-11-02 DIAGNOSIS — D6481 Anemia due to antineoplastic chemotherapy: Secondary | ICD-10-CM

## 2018-11-02 DIAGNOSIS — T451X5A Adverse effect of antineoplastic and immunosuppressive drugs, initial encounter: Secondary | ICD-10-CM

## 2018-11-02 DIAGNOSIS — Z5112 Encounter for antineoplastic immunotherapy: Secondary | ICD-10-CM | POA: Diagnosis not present

## 2018-11-02 LAB — CMP (CANCER CENTER ONLY)
ALT: 13 U/L (ref 0–44)
AST: 15 U/L (ref 15–41)
Albumin: 3.4 g/dL — ABNORMAL LOW (ref 3.5–5.0)
Alkaline Phosphatase: 129 U/L — ABNORMAL HIGH (ref 38–126)
Anion gap: 10 (ref 5–15)
BUN: 28 mg/dL — ABNORMAL HIGH (ref 8–23)
CO2: 25 mmol/L (ref 22–32)
Calcium: 8.8 mg/dL — ABNORMAL LOW (ref 8.9–10.3)
Chloride: 101 mmol/L (ref 98–111)
Creatinine: 0.86 mg/dL (ref 0.44–1.00)
GFR, Est AFR Am: >60 mL/min
GFR, Estimated: >60 mL/min
Glucose, Bld: 86 mg/dL (ref 70–99)
Potassium: 4 mmol/L (ref 3.5–5.1)
Sodium: 136 mmol/L (ref 135–145)
Total Bilirubin: 0.3 mg/dL (ref 0.3–1.2)
Total Protein: 6.7 g/dL (ref 6.5–8.1)

## 2018-11-02 LAB — TSH: TSH: 3.332 u[IU]/mL (ref 0.308–3.960)

## 2018-11-02 LAB — CBC WITH DIFFERENTIAL (CANCER CENTER ONLY)
Abs Immature Granulocytes: 0.02 K/uL (ref 0.00–0.07)
Basophils Absolute: 0.1 K/uL (ref 0.0–0.1)
Basophils Relative: 1 %
Eosinophils Absolute: 0.3 K/uL (ref 0.0–0.5)
Eosinophils Relative: 4 %
HCT: 35.5 % — ABNORMAL LOW (ref 36.0–46.0)
Hemoglobin: 11.5 g/dL — ABNORMAL LOW (ref 12.0–15.0)
Immature Granulocytes: 0 %
Lymphocytes Relative: 18 %
Lymphs Abs: 1.3 K/uL (ref 0.7–4.0)
MCH: 29 pg (ref 26.0–34.0)
MCHC: 32.4 g/dL (ref 30.0–36.0)
MCV: 89.4 fL (ref 80.0–100.0)
Monocytes Absolute: 0.6 K/uL (ref 0.1–1.0)
Monocytes Relative: 8 %
Neutro Abs: 5.2 K/uL (ref 1.7–7.7)
Neutrophils Relative %: 69 %
Platelet Count: 198 K/uL (ref 150–400)
RBC: 3.97 MIL/uL (ref 3.87–5.11)
RDW: 13 % (ref 11.5–15.5)
WBC Count: 7.4 K/uL (ref 4.0–10.5)
nRBC: 0 % (ref 0.0–0.2)

## 2018-11-02 MED ORDER — SODIUM CHLORIDE 0.9% FLUSH
10.0000 mL | Freq: Once | INTRAVENOUS | Status: AC
  Administered 2018-11-02: 10 mL
  Filled 2018-11-02: qty 10

## 2018-11-02 MED ORDER — SODIUM CHLORIDE 0.9 % IV SOLN
200.0000 mg | Freq: Once | INTRAVENOUS | Status: AC
  Administered 2018-11-02: 14:00:00 200 mg via INTRAVENOUS
  Filled 2018-11-02: qty 8

## 2018-11-02 MED ORDER — SODIUM CHLORIDE 0.9 % IV SOLN
Freq: Once | INTRAVENOUS | Status: AC
  Administered 2018-11-02: 14:00:00 via INTRAVENOUS
  Filled 2018-11-02: qty 250

## 2018-11-02 MED ORDER — SODIUM CHLORIDE 0.9% FLUSH
10.0000 mL | INTRAVENOUS | Status: DC | PRN
  Administered 2018-11-02: 10 mL
  Filled 2018-11-02: qty 10

## 2018-11-02 MED ORDER — HEPARIN SOD (PORK) LOCK FLUSH 100 UNIT/ML IV SOLN
500.0000 [IU] | Freq: Once | INTRAVENOUS | Status: AC | PRN
  Administered 2018-11-02: 15:00:00 500 [IU]
  Filled 2018-11-02: qty 5

## 2018-11-02 MED ORDER — LIDOCAINE-PRILOCAINE 2.5-2.5 % EX CREA: 1.0000 "application " | TOPICAL_CREAM | CUTANEOUS | 6 refills | Status: DC | PRN

## 2018-11-02 MED ORDER — LORAZEPAM 1 MG PO TABS: ORAL_TABLET | ORAL | 0 refills | Status: DC

## 2018-11-02 NOTE — Patient Instructions (Signed)
[facility] Discharge Instructions for Patients Receiving Chemotherapy  Today you received the following chemotherapy agents Keytruda  To help prevent nausea and vomiting after your treatment, we encourage you to take your nausea medication: As directed by MD   If you develop nausea and vomiting that is not controlled by your nausea medication, call the clinic.   BELOW ARE SYMPTOMS THAT SHOULD BE REPORTED IMMEDIATELY:  *FEVER GREATER THAN 100.5 F  *CHILLS WITH OR WITHOUT FEVER  NAUSEA AND VOMITING THAT IS NOT CONTROLLED WITH YOUR NAUSEA MEDICATION  *UNUSUAL SHORTNESS OF BREATH  *UNUSUAL BRUISING OR BLEEDING  TENDERNESS IN MOUTH AND THROAT WITH OR WITHOUT PRESENCE OF ULCERS  *URINARY PROBLEMS  *BOWEL PROBLEMS  UNUSUAL RASH Items with * indicate a potential emergency and should be followed up as soon as possible.  Feel free to call the clinic should you have any questions or concerns. The clinic phone number is [phone].  Please show the CHEMO ALERT CARD at check-in to the Emergency Department and triage nurse.   

## 2018-11-03 ENCOUNTER — Encounter: Payer: Self-pay | Admitting: Hematology and Oncology

## 2018-11-03 ENCOUNTER — Telehealth: Payer: Self-pay | Admitting: *Deleted

## 2018-11-03 NOTE — Telephone Encounter (Signed)
Called and spoke with patient regarding her appts in June. Gave the patient date/times for the appts.

## 2018-11-03 NOTE — Assessment & Plan Note (Signed)
Her recent TSH is satisfactory She will continue chronic thyroid replacement therapy We will adjust her thyroid medicine as needed

## 2018-11-03 NOTE — Assessment & Plan Note (Signed)
She has recurrent anemia with history of iron deficiency I plan to recheck iron studies again in her next visit

## 2018-11-03 NOTE — Assessment & Plan Note (Signed)
Her last CT imaging showed stable disease We will proceed with treatment as scheduled for 3 to 4 months with plan to repeat imaging study in June/July She continues to feel well without any symptom

## 2018-11-03 NOTE — Progress Notes (Signed)
[facility] OFFICE PROGRESS NOTE  Patient Care Team: [name], MD as PCP - General (Family Medicine)  ASSESSMENT & PLAN:  Recurrent carcinoma of endometrium [facility]) Her last CT imaging showed stable disease We will proceed with treatment as scheduled for 3 to 4 months with plan to repeat imaging study in June/July She continues to feel well without any symptom  Anemia due to antineoplastic chemotherapy She has recurrent anemia with history of iron deficiency I plan to recheck iron studies again in her next visit  Acquired hypothyroidism Her recent TSH is satisfactory She will continue chronic thyroid replacement therapy We will adjust her thyroid medicine as needed   Orders Placed This Encounter  Procedures  . Ferritin    Standing Status:   Future    Standing Expiration Date:   11/03/2019  . Iron and TIBC    Standing Status:   Future    Standing Expiration Date:   12/08/2019    INTERVAL HISTORY: Please see below for problem oriented charting. She returns for treatment and follow-up She denies recent nausea, abdominal pain or changes in bowel habits She has gained some weight due to lack of physical activity She continues to have intermittent restless leg at night No recent infection, fever or chills The patient denies any recent signs or symptoms of bleeding such as spontaneous epistaxis, hematuria or hematochezia.   SUMMARY OF ONCOLOGIC HISTORY: Oncology History   MSI stable on June 2017 tissue but high from [facility] study from November 2017  05/15/16: ER is moderately positive (70%). PR is strongly positive (80%).     Recurrent carcinoma of endometrium ([place])   01/02/2016 Pathology Results    Uterus +/- tubes/ovaries, neoplastic, cervix ENDOMETRIAL ADENOCARCINOMA, FIGO GRADE 2 (4.7 CM) THE TUMOR INVADES LESS THAN ONE-HALF OF THE MYOMETRIUM (PT1A) ALL MARGINS OF RESECTION ARE NEGATIVE FOR CARCINOMA LEIOMYOMAS AND ADENOMYOSIS BILATERAL  FALLOPIAN TUBES AND OVARIES: HISTOLOGICAL UNREMARKABLE 2. Lymph node, sentinel, biopsy, right obturator ONE BENIGN LYMPH NODE (0/1) 3. Lymph nodes, regional resection, left pelvic FOUR BENIGN LYMPH NODES (0/4)    01/02/2016 Surgery    Dr. Denman George performed robotic-assisted laparoscopic total hysterectomy with bilateral salpingoophorectomy, sentinel lymph node biopsy, lymphadenectomy      05/15/2016 Pathology Results    Vagina, biopsy, mid - ADENOCARCINOMA, SEE COMMENT. Microscopic Comment The morphology along with the patient's history are consistent with recurrent endometrioid adenocarcinoma. The carcinoma has a similar appearance to the primary ([id number]).    05/20/2016 Imaging    Ct scan abdomen showed solid 2.5 cm peritoneal mass in the mid to anterior left pelvis, suspicious for peritoneal metastasis. 2. Small expansile low-attenuation filling defect in the left external iliac vein, cannot exclude a small deep venous thrombus. Consider correlation with left lower extremity venous Doppler scan. 3. Small simple fluid density structure in the left pelvic sidewall abutting the left external iliac vessels, favor a small postoperative seroma. 4. No ascites. 5. No lymphadenopathy.  No metastatic disease in the chest. 6. Aortic atherosclerosis.    06/12/2016 PET scan    Intensely hypermetabolic 2.1 cm central pelvic peritoneal mass just to the left of midline, consistent with peritoneal metastatic recurrence. No ascites. 2. No additional hypermetabolic sites of metastatic disease. 3. Diffuse thyroid hypermetabolism without discrete thyroid nodule, favoring thyroiditis. Recommend correlation with serum thyroid function tests.    06/24/2016 - 07/22/2016 Chemotherapy    The patient had weekly cisplatin. She has missed several doses due to infection and pancytopenia    06/24/2016 -  09/04/2016 Radiation Therapy    She completed concurrent radiation therapy Radiation treatment dates:   IMRT :  06/24/16 - 08/01/16 HDR : 08/13/16, 08/20/16, 08/27/16, 09/04/16  Site/dose:   Pelvis treated to 55 Gy in 25 fractions (simultaneous integrated boost technique) Vaginal Cuff treated to 24 Gy in 4 fractions    08/15/2016 - 12/03/2016 Chemotherapy    She received 6 cycles of carboplatin/Taxol    09/27/2016 Imaging    Interval decrease in size of previously described solid peritoneal nodule within the left anterior pelvis. Near complete resolution of previously described low-attenuation structure along the left pelvic sidewall. No evidence for metastatic disease in the chest. Aortic atherosclerosis.    12/30/2016 Imaging    Ct abdomen 1. Solitary left pelvic peritoneal implant is mildly decreased in size in the interval. 2. No new or progressive metastatic disease in the abdomen or pelvis. No ascites. 3. Aortic atherosclerosis.    04/02/2017 Imaging    Left lower quadrant peritoneal implant referenced on previous exam measures 1.7 x 1.3 cm, image 69 of series 2. Increased from 0.8 x 0.8 cm previously peer no new peritoneal implants identified.  Musculoskeletal: The degenerative disc disease noted within the lumbar spine.  IMPRESSION: 1. Solitary left pelvic peritoneal implant is increased in size in the interval. 2. No new sites of disease.  No ascites. 3. Aortic atherosclerosis    04/11/2017 PET scan    1. The left side of pelvis peritoneal implant has decreased in size and degree of FDG uptake compatible with response to therapy. No new areas of peritoneal disease identified. 2. Persistent diffuse increased uptake within the thyroid gland. Correlation with patient's thyroid function may be helpful.    05/26/2017 Imaging    1. Enlarging tumor implant along the left adnexa, currently 2.6 by 2.4 cm and previously 1.9 by 1.3 cm. No new tumor implant or other specific cause for the patient's pelvic symptoms is currently identified. 2.  Aortic Atherosclerosis (ICD10-I70.0). 3. Lumbar  spondylosis and degenerative disc disease causing multilevel impingement.    06/04/2017 - 08/28/2017 Chemotherapy    The patient started letrozole and everolimus    08/26/2017 Imaging    Increased size of mass in the left adnexal region.  Several new small liver metastases in right hepatic lobe    09/08/2017 Imaging    LV EF: 60% -  65%    09/10/2017 Procedure    Technically successful right IJ power-injectable port catheter placement. Ready for routine use    12/04/2017 Imaging    1. Interval increase in size and number of multiple lesions within the liver compatible with hepatic metastatic disease. 2. Interval increase in size mass within the left hemipelvis.    12/15/2017 -  Chemotherapy    The patient had pembrolizumab (KEYTRUDA) 200 mg in sodium chloride 0.9 % 50 mL chemo infusion, 200 mg, Intravenous, Once, 1 of 6 cycles Administration: 200 mg (12/15/2017)  for chemotherapy treatment.     12/21/2017 Imaging    1. Moderate left hydronephrosis and hydroureter, similar compared to most recent CT from May 2019; obstruction appears to be secondary to a left pelvic mass/metastatic focus which has increased in size since the prior CT. 2. Numerous hepatic metastatic lesions, suspect that some may be increased in size but difficult to further characterize without intravenous contrast. Increased size of left pelvic soft tissue mass/metastatic lesion. Mass abuts and possibly invades the sigmoid colon.     02/20/2018 Imaging    Interval decrease in hepatic metastases.  Decreased mass or lymphadenopathy in the left external iliac chain. Interval resolution of left hydroureteronephrosis.  No new or progressive disease within the abdomen or pelvis.    05/16/2018 Imaging    05/16/2018 CT Abdomen IMPRESSION: 1. Slight interval decrease in size of hepatic metastatic disease. 2. Slight interval decrease in size centrally necrotic mass within the left external iliac region.    09/18/2018 Imaging     1. Today's study demonstrates a mixed response to therapy. Specifically, while the previously noted hepatic metastases appear decreased, the soft tissue mass along the left pelvic sidewall appears slightly larger than the prior study. No new metastatic disease is noted elsewhere in the abdomen or pelvis. 2. Aortic atherosclerosis. 3. Mild cardiomegaly. 4. Additional incidental findings, as above      REVIEW OF SYSTEMS:   Constitutional: Denies fevers, chills or abnormal weight loss Eyes: Denies blurriness of vision Ears, nose, mouth, throat, and face: Denies mucositis or sore throat Respiratory: Denies cough, dyspnea or wheezes Cardiovascular: Denies palpitation, chest discomfort or lower extremity swelling Gastrointestinal:  Denies nausea, heartburn or change in bowel habits Skin: Denies abnormal skin rashes Lymphatics: Denies new lymphadenopathy or easy bruising Neurological:Denies numbness, tingling or new weaknesses Behavioral/Psych: Mood is stable, no new changes  All other systems were reviewed with the patient and are negative.  I have reviewed the past medical history, past surgical history, social history and family history with the patient and they are unchanged from previous note.  ALLERGIES:  is allergic to latex.  MEDICATIONS:  Current Outpatient Medications  Medication Sig Dispense Refill  . acetaminophen (TYLENOL) 325 MG tablet Take 650 mg by mouth every 6 (six) hours as needed for mild pain or moderate pain.     Marland Kitchen amoxicillin (AMOXIL) 500 MG capsule Take 2,000 mg by mouth See admin instructions. Prior to dental appointment, takes 4 tables prior to appt    . Biotin w/ Vitamins C & E (HAIR/SKIN/NAILS PO) Take 1 tablet by mouth daily.    . Calcium Carb-Cholecalciferol (CALCIUM 600 + D PO) Take 2 tablets by mouth daily.    . fluticasone (FLONASE) 50 MCG/ACT nasal spray Place 1 spray into both nostrils 2 (two) times daily as needed for allergies.   1  . gabapentin  (NEURONTIN) 600 MG tablet TAKE 1 TABLET BY MOUTH TWICE A DAY (Patient taking differently: Take 1 tablet 2 times a day) 60 tablet 9  . gabapentin (NEURONTIN) 600 MG tablet TAKE 1 TABLET BY MOUTH TWICE A DAY FOR RESTLESS LEGS AND NEUROPATHY.    Marland Kitchen ibuprofen (ADVIL,MOTRIN) 200 MG tablet Take 400 mg by mouth every 8 (eight) hours as needed for mild pain.    Marland Kitchen levothyroxine (SYNTHROID, LEVOTHROID) 175 MCG tablet Take 1 tablet (175 mcg total) by mouth daily before breakfast. 30 tablet 2  . lidocaine-prilocaine (EMLA) cream Apply 1 application topically as needed. 30 g 6  . loratadine (CLARITIN) 10 MG tablet Take 10 mg by mouth daily.    Marland Kitchen LORazepam (ATIVAN) 1 MG tablet TAKE 1 TABLET BY MOUTH EVERY 8 HOURS AS NEEDED FOR ANXIETY OR SLEEP 90 tablet 0  . losartan-hydrochlorothiazide (HYZAAR) 100-12.5 MG tablet Take 0.5 tablets by mouth daily. 60 tablet 9  . Magnesium 250 MG TABS Take 250 mg by mouth daily.    . magnesium oxide (MAG-OX) 400 (241.3 Mg) MG tablet Take 1 tablet (400 mg total) by mouth 2 (two) times daily. 60 tablet 9  . magnesium oxide (MAG-OX) 400 MG tablet Take 1 tablet by  mouth 2 (two) times daily.    . Melatonin 10 MG CAPS Take 10 mg by mouth at bedtime.    . ondansetron (ZOFRAN) 8 MG tablet Take 1 tablet (8 mg total) by mouth every 8 (eight) hours as needed for nausea. 60 tablet 1  . potassium gluconate 595 (99 K) MG TABS tablet Take by mouth.    . traZODone (DESYREL) 50 MG tablet Take 1.5 tablets (75 mg total) by mouth at bedtime. (Patient taking differently: Take 50 mg by mouth at bedtime. ) 135 tablet 1   No current facility-administered medications for this visit.     PHYSICAL EXAMINATION: ECOG PERFORMANCE STATUS: 1 - Symptomatic but completely ambulatory  Vitals:   11/02/18 1316  BP: 135/74  Pulse: 75  Resp: 18  Temp: 98.3 F (36.8 C)  SpO2: 100%   Filed Weights   11/02/18 1316  Weight: 205 lb 8 oz (93.2 kg)    GENERAL:alert, no distress and comfortable SKIN: skin  color, texture, turgor are normal, no rashes or significant lesions EYES: normal, Conjunctiva are pink and non-injected, sclera clear OROPHARYNX:no exudate, no erythema and lips, buccal mucosa, and tongue normal  NECK: supple, thyroid normal size, non-tender, without nodularity LYMPH:  no palpable lymphadenopathy in the cervical, axillary or inguinal LUNGS: clear to auscultation and percussion with normal breathing effort HEART: regular rate & rhythm and no murmurs and no lower extremity edema ABDOMEN:abdomen soft, non-tender and normal bowel sounds Musculoskeletal:no cyanosis of digits and no clubbing  NEURO: alert & oriented x 3 with fluent speech, no focal motor/sensory deficits  LABORATORY DATA:  I have reviewed the data as listed    Component Value Date/Time   NA 136 11/02/2018 1241   NA 141 07/09/2017 1312   K 4.0 11/02/2018 1241   K 3.9 07/09/2017 1312   CL 101 11/02/2018 1241   CO2 25 11/02/2018 1241   CO2 27 07/09/2017 1312   GLUCOSE 86 11/02/2018 1241   GLUCOSE 109 07/09/2017 1312   BUN 28 (H) 11/02/2018 1241   BUN 16.4 07/09/2017 1312   CREATININE 0.86 11/02/2018 1241   CREATININE 0.8 07/09/2017 1312   CALCIUM 8.8 (L) 11/02/2018 1241   CALCIUM 9.2 07/09/2017 1312   PROT 6.7 11/02/2018 1241   PROT 7.1 07/09/2017 1312   ALBUMIN 3.4 (L) 11/02/2018 1241   ALBUMIN 3.1 (L) 07/09/2017 1312   AST 15 11/02/2018 1241   AST 35 (H) 07/09/2017 1312   ALT 13 11/02/2018 1241   ALT 43 07/09/2017 1312   ALKPHOS 129 (H) 11/02/2018 1241   ALKPHOS 182 (H) 07/09/2017 1312   BILITOT 0.3 11/02/2018 1241   BILITOT 0.34 07/09/2017 1312   GFRNONAA >60 11/02/2018 1241   GFRAA >60 11/02/2018 1241    No results found for: SPEP, UPEP  Lab Results  Component Value Date   WBC 7.4 11/02/2018   NEUTROABS 5.2 11/02/2018   HGB 11.5 (L) 11/02/2018   HCT 35.5 (L) 11/02/2018   MCV 89.4 11/02/2018   PLT 198 11/02/2018      Chemistry      Component Value Date/Time   NA 136 11/02/2018  1241   NA 141 07/09/2017 1312   K 4.0 11/02/2018 1241   K 3.9 07/09/2017 1312   CL 101 11/02/2018 1241   CO2 25 11/02/2018 1241   CO2 27 07/09/2017 1312   BUN 28 (H) 11/02/2018 1241   BUN 16.4 07/09/2017 1312   CREATININE 0.86 11/02/2018 1241   CREATININE 0.8 07/09/2017 1312  Component Value Date/Time   CALCIUM 8.8 (L) 11/02/2018 1241   CALCIUM 9.2 07/09/2017 1312   ALKPHOS 129 (H) 11/02/2018 1241   ALKPHOS 182 (H) 07/09/2017 1312   AST 15 11/02/2018 1241   AST 35 (H) 07/09/2017 1312   ALT 13 11/02/2018 1241   ALT 43 07/09/2017 1312   BILITOT 0.3 11/02/2018 1241   BILITOT 0.34 07/09/2017 1312       All questions were answered. The patient knows to call the clinic with any problems, questions or concerns. No barriers to learning was detected.  I spent 15 minutes counseling the patient face to face. The total time spent in the appointment was 20 minutes and more than 50% was on counseling and review of test results  Heath Lark, MD 11/03/2018 8:45 AM

## 2018-11-12 MED ORDER — ACETAMINOPHEN 325 MG PO TABS
ORAL_TABLET | ORAL | Status: AC
  Filled 2018-11-12: qty 2

## 2018-11-12 MED ORDER — FAMOTIDINE IN NACL 20-0.9 MG/50ML-% IV SOLN
INTRAVENOUS | Status: AC
  Filled 2018-11-12: qty 50

## 2018-11-12 MED ORDER — DEXAMETHASONE SODIUM PHOSPHATE 10 MG/ML IJ SOLN
INTRAMUSCULAR | Status: AC
  Filled 2018-11-12: qty 1

## 2018-11-12 MED ORDER — DIPHENHYDRAMINE HCL 50 MG/ML IJ SOLN
INTRAMUSCULAR | Status: AC
  Filled 2018-11-12: qty 1

## 2018-11-23 ENCOUNTER — Inpatient Hospital Stay: Payer: Medicare Other

## 2018-11-23 ENCOUNTER — Inpatient Hospital Stay: Payer: Medicare Other | Attending: Hematology and Oncology

## 2018-11-24 ENCOUNTER — Telehealth: Payer: Self-pay

## 2018-11-24 NOTE — Telephone Encounter (Signed)
-----   Message from Heath Lark, MD sent at 11/24/2018  9:01 AM EDT ----- Regarding: RE: missed appt I suggest to keep everything as is and not reschedule The Keytruda stays in her system long enough I won't worry about it, unless if she insist then we have to reschedule everything ----- Message ----- From: Paulla Dolly, RN Sent: 11/24/2018   8:40 AM EDT To: Heath Lark, MD Subject: missed appt                                    Pt missed lab/flush, infusion appts yesterday. Next infusion scheduled for 12/14/18. Do you want me to see if we can get her in some time this week? Will 12/14/18 appt need to be moved out?

## 2018-11-24 NOTE — Telephone Encounter (Signed)
Spoke with pt and gave below msg.  Pt verbalizes understanding.   And knows to return to the clinic on June 8th.

## 2018-11-25 ENCOUNTER — Other Ambulatory Visit: Payer: Self-pay | Admitting: Hematology and Oncology

## 2018-12-03 ENCOUNTER — Telehealth: Payer: Self-pay

## 2018-12-03 NOTE — Telephone Encounter (Signed)
I am not aware of it causing blisters Also, she is on reduced dose (200 mg) compared to usual dose of 400 mg Option is to stop her treatment which I do not recommend

## 2018-12-03 NOTE — Telephone Encounter (Signed)
Called and given below message. She verbalized understating. ?

## 2018-12-03 NOTE — Telephone Encounter (Signed)
She called and left a message to call her.  Called back. She saw her dentist recently and has very large ulcers on both sides of her mouth. Dentist gave her magic mouth wash 1 1/2 weeks ago for pain and she instructed to start applying topical cortisone to ulcers yesterday. Her dermatologist biopsied a ulcer on her leg a week ago and re-biopised It yesterday. Biopsy showed a ulcer of some type. Could this be anything to do with her treatment?

## 2018-12-07 ENCOUNTER — Telehealth: Payer: Self-pay | Admitting: *Deleted

## 2018-12-07 NOTE — Telephone Encounter (Signed)
MR faxed to [facility] - Release [id number]

## 2018-12-14 ENCOUNTER — Ambulatory Visit: Payer: Medicare Other

## 2018-12-14 ENCOUNTER — Inpatient Hospital Stay (HOSPITAL_BASED_OUTPATIENT_CLINIC_OR_DEPARTMENT_OTHER): Payer: Medicare Other | Admitting: Hematology and Oncology

## 2018-12-14 ENCOUNTER — Other Ambulatory Visit: Payer: Medicare Other

## 2018-12-14 ENCOUNTER — Inpatient Hospital Stay: Payer: Medicare Other

## 2018-12-14 ENCOUNTER — Ambulatory Visit: Payer: Medicare Other | Admitting: Hematology and Oncology

## 2018-12-14 ENCOUNTER — Other Ambulatory Visit: Payer: Self-pay

## 2018-12-14 ENCOUNTER — Inpatient Hospital Stay: Payer: Medicare Other | Attending: Hematology and Oncology

## 2018-12-14 ENCOUNTER — Other Ambulatory Visit: Payer: Self-pay | Admitting: Hematology and Oncology

## 2018-12-14 DIAGNOSIS — G2581 Restless legs syndrome: Secondary | ICD-10-CM

## 2018-12-14 DIAGNOSIS — C541 Malignant neoplasm of endometrium: Secondary | ICD-10-CM

## 2018-12-14 DIAGNOSIS — Z452 Encounter for adjustment and management of vascular access device: Secondary | ICD-10-CM | POA: Diagnosis not present

## 2018-12-14 DIAGNOSIS — Z5112 Encounter for antineoplastic immunotherapy: Secondary | ICD-10-CM | POA: Diagnosis present

## 2018-12-14 DIAGNOSIS — K1230 Oral mucositis (ulcerative), unspecified: Secondary | ICD-10-CM

## 2018-12-14 DIAGNOSIS — Z79899 Other long term (current) drug therapy: Secondary | ICD-10-CM | POA: Diagnosis not present

## 2018-12-14 DIAGNOSIS — E039 Hypothyroidism, unspecified: Secondary | ICD-10-CM

## 2018-12-14 DIAGNOSIS — C787 Secondary malignant neoplasm of liver and intrahepatic bile duct: Secondary | ICD-10-CM

## 2018-12-14 DIAGNOSIS — L309 Dermatitis, unspecified: Secondary | ICD-10-CM | POA: Diagnosis not present

## 2018-12-14 DIAGNOSIS — T451X5A Adverse effect of antineoplastic and immunosuppressive drugs, initial encounter: Secondary | ICD-10-CM

## 2018-12-14 DIAGNOSIS — D6481 Anemia due to antineoplastic chemotherapy: Secondary | ICD-10-CM

## 2018-12-14 DIAGNOSIS — I700 Atherosclerosis of aorta: Secondary | ICD-10-CM | POA: Insufficient documentation

## 2018-12-14 LAB — CMP (CANCER CENTER ONLY)
ALT: 14 U/L (ref 0–44)
AST: 19 U/L (ref 15–41)
Albumin: 3.5 g/dL (ref 3.5–5.0)
Alkaline Phosphatase: 129 U/L — ABNORMAL HIGH (ref 38–126)
Anion gap: 10 (ref 5–15)
BUN: 23 mg/dL (ref 8–23)
CO2: 24 mmol/L (ref 22–32)
Calcium: 9 mg/dL (ref 8.9–10.3)
Chloride: 104 mmol/L (ref 98–111)
Creatinine: 0.78 mg/dL (ref 0.44–1.00)
GFR, Est AFR Am: >60 mL/min (ref >60)
GFR, Estimated: >60 mL/min (ref >60)
Glucose, Bld: 78 mg/dL (ref 70–99)
Potassium: 3.8 mmol/L (ref 3.5–5.1)
Sodium: 138 mmol/L (ref 135–145)
Total Bilirubin: 0.5 mg/dL (ref 0.3–1.2)
Total Protein: 6.8 g/dL (ref 6.5–8.1)

## 2018-12-14 LAB — CBC WITH DIFFERENTIAL (CANCER CENTER ONLY)
Abs Immature Granulocytes: 0.02 10*3/uL (ref 0.00–0.07)
Basophils Absolute: 0.1 10*3/uL (ref 0.0–0.1)
Basophils Relative: 1 %
Eosinophils Absolute: 0.1 10*3/uL (ref 0.0–0.5)
Eosinophils Relative: 2 %
HCT: 37.1 % (ref 36.0–46.0)
Hemoglobin: 12.2 g/dL (ref 12.0–15.0)
Immature Granulocytes: 0 %
Lymphocytes Relative: 22 %
Lymphs Abs: 1.6 10*3/uL (ref 0.7–4.0)
MCH: 29.1 pg (ref 26.0–34.0)
MCHC: 32.9 g/dL (ref 30.0–36.0)
MCV: 88.5 fL (ref 80.0–100.0)
Monocytes Absolute: 0.5 10*3/uL (ref 0.1–1.0)
Monocytes Relative: 7 %
Neutro Abs: 4.9 10*3/uL (ref 1.7–7.7)
Neutrophils Relative %: 68 %
Platelet Count: 163 10*3/uL (ref 150–400)
RBC: 4.19 MIL/uL (ref 3.87–5.11)
RDW: 12.7 % (ref 11.5–15.5)
WBC Count: 7.1 10*3/uL (ref 4.0–10.5)
nRBC: 0 % (ref 0.0–0.2)

## 2018-12-14 LAB — IRON AND TIBC
Iron: 70 ug/dL (ref 41–142)
Saturation Ratios: 28 % (ref 21–57)
TIBC: 251 ug/dL (ref 236–444)
UIBC: 182 ug/dL (ref 120–384)

## 2018-12-14 LAB — TSH: TSH: 3.105 u[IU]/mL (ref 0.308–3.960)

## 2018-12-14 LAB — FERRITIN: Ferritin: 207 ng/mL (ref 11–307)

## 2018-12-14 MED ORDER — SODIUM CHLORIDE 0.9 % IV SOLN
200.0000 mg | Freq: Once | INTRAVENOUS | Status: AC
  Administered 2018-12-14: 200 mg via INTRAVENOUS
  Filled 2018-12-14: qty 8

## 2018-12-14 MED ORDER — ALTEPLASE 2 MG IJ SOLR
2.0000 mg | Freq: Once | INTRAMUSCULAR | Status: DC
  Filled 2018-12-14: qty 2

## 2018-12-14 MED ORDER — SODIUM CHLORIDE 0.9% FLUSH
10.0000 mL | Freq: Once | INTRAVENOUS | Status: AC
  Administered 2018-12-14: 10 mL
  Filled 2018-12-14: qty 10

## 2018-12-14 MED ORDER — SODIUM CHLORIDE 0.9% FLUSH
10.0000 mL | INTRAVENOUS | Status: DC | PRN
  Filled 2018-12-14: qty 10

## 2018-12-14 MED ORDER — ALTEPLASE 2 MG IJ SOLR
2.0000 mg | Freq: Once | INTRAMUSCULAR | Status: AC | PRN
  Administered 2018-12-14: 2 mg
  Filled 2018-12-14: qty 2

## 2018-12-14 MED ORDER — HEPARIN SOD (PORK) LOCK FLUSH 100 UNIT/ML IV SOLN
500.0000 [IU] | Freq: Once | INTRAVENOUS | Status: AC | PRN
  Administered 2018-12-14: 500 [IU]
  Filled 2018-12-14: qty 5

## 2018-12-14 MED ORDER — SODIUM CHLORIDE 0.9 % IV SOLN
Freq: Once | INTRAVENOUS | Status: AC
  Administered 2018-12-14: 15:00:00 via INTRAVENOUS
  Filled 2018-12-14: qty 250

## 2018-12-14 MED ORDER — ALTEPLASE 2 MG IJ SOLR
INTRAMUSCULAR | Status: AC
  Filled 2018-12-14: qty 2

## 2018-12-14 NOTE — Patient Instructions (Signed)
[facility] Discharge Instructions for Patients Receiving Chemotherapy  Today you received the following chemotherapy agents Pembrolizumab (KEYTRUDA).  To help prevent nausea and vomiting after your treatment, we encourage you to take your nausea medication as prescribed.   If you develop nausea and vomiting that is not controlled by your nausea medication, call the clinic.   BELOW ARE SYMPTOMS THAT SHOULD BE REPORTED IMMEDIATELY:  *FEVER GREATER THAN 100.5 F  *CHILLS WITH OR WITHOUT FEVER  NAUSEA AND VOMITING THAT IS NOT CONTROLLED WITH YOUR NAUSEA MEDICATION  *UNUSUAL SHORTNESS OF BREATH  *UNUSUAL BRUISING OR BLEEDING  TENDERNESS IN MOUTH AND THROAT WITH OR WITHOUT PRESENCE OF ULCERS  *URINARY PROBLEMS  *BOWEL PROBLEMS  UNUSUAL RASH Items with * indicate a potential emergency and should be followed up as soon as possible.  Feel free to call the clinic should you have any questions or concerns. The clinic phone number is [phone].  Please show the CHEMO ALERT CARD at check-in to the Emergency Department and triage nurse.  Coronavirus (COVID-19) Are you at risk?  Are you at risk for the Coronavirus (COVID-19)?  To be considered HIGH RISK for Coronavirus (COVID-19), you have to meet the following criteria:  . Traveled to [place], [place], [place], [place] or [place]; or in the [place] to [place], [place], [place], or [place]; and have fever, cough, and shortness of breath within the last 2 weeks of travel OR . Been in close contact with a person diagnosed with COVID-19 within the last 2 weeks and have fever, cough, and shortness of breath . IF YOU DO NOT MEET THESE CRITERIA, YOU ARE CONSIDERED LOW RISK FOR COVID-19.  What to do if you are HIGH RISK for COVID-19?  . If you are having a medical emergency, call 911. . Seek medical care right away. Before you go to a doctor's office, urgent care or emergency department, call ahead and tell  them about your recent travel, contact with someone diagnosed with COVID-19, and your symptoms. You should receive instructions from your physician's office regarding next steps of care.  . When you arrive at healthcare provider, tell the healthcare staff immediately you have returned from visiting [place], [place], [place], [place] or [place]; or traveled in the [place] to [place], [place], [place], or [place]; in the last two weeks or you have been in close contact with a person diagnosed with COVID-19 in the last 2 weeks.   . Tell the health care staff about your symptoms: fever, cough and shortness of breath. . After you have been seen by a medical provider, you will be either: o Tested for (COVID-19) and discharged home on quarantine except to seek medical care if symptoms worsen, and asked to  - Stay home and avoid contact with others until you get your results (4-5 days)  - Avoid travel on public transportation if possible (such as bus, train, or airplane) or o Sent to the Emergency Department by EMS for evaluation, COVID-19 testing, and possible admission depending on your condition and test results.  What to do if you are LOW RISK for COVID-19?  Reduce your risk of any infection by using the same precautions used for avoiding the common cold or flu:  . Wash your hands often with soap and warm water for at least 20 seconds.  If soap and water are not readily available, use an alcohol-based hand sanitizer with at least 60% alcohol.  . If coughing or   sneezing, cover your mouth and nose by coughing or sneezing into the elbow areas of your shirt or coat, into a tissue or into your sleeve (not your hands). . Avoid shaking hands with others and consider head nods or verbal greetings only. . Avoid touching your eyes, nose, or mouth with unwashed hands.  . Avoid close contact with people who are sick. . Avoid places or events with large numbers of people in one location, like concerts or  sporting events. . Carefully consider travel plans you have or are making. . If you are planning any travel outside or inside the [place], visit the CDC's Travelers' Health webpage for the latest health notices. . If you have some symptoms but not all symptoms, continue to monitor at home and seek medical attention if your symptoms worsen. . If you are having a medical emergency, call 911.   ADDITIONAL HEALTHCARE OPTIONS FOR PATIENTS  [facility] / e-Visit: [email or web address]         [facility]: [phone]  [facility]: [phone]                   [facility]: [phone]    

## 2018-12-15 ENCOUNTER — Encounter: Payer: Self-pay | Admitting: Hematology and Oncology

## 2018-12-15 ENCOUNTER — Telehealth: Payer: Self-pay | Admitting: Hematology and Oncology

## 2018-12-15 DIAGNOSIS — K1230 Oral mucositis (ulcerative), unspecified: Secondary | ICD-10-CM | POA: Insufficient documentation

## 2018-12-15 DIAGNOSIS — L309 Dermatitis, unspecified: Secondary | ICD-10-CM | POA: Insufficient documentation

## 2018-12-15 NOTE — Telephone Encounter (Signed)
Talk with patient regarding schedule °

## 2018-12-15 NOTE — Assessment & Plan Note (Signed)
She has recent skin biopsy and was diagnosed with dermatitis, improved with high-dose topical steroids It could be related to immune mediated reaction to pembrolizumab She is not symptomatic She would like to continue pembrolizumab and be monitored closely and I agree with the plan of care.

## 2018-12-15 NOTE — Assessment & Plan Note (Signed)
She has chronic restless leg Repeat iron studies are adequate She will continue her medication as directed

## 2018-12-15 NOTE — Assessment & Plan Note (Signed)
She has some mouth sores I do not believe this is consistent with side effects of pembrolizumab She will continue oral care through her dentist.

## 2018-12-15 NOTE — Assessment & Plan Note (Signed)
Overall, she is not symptomatic Her last imaging study in March was positive response to therapy Plan to repeat imaging study in September unless she have new symptoms She will continue treatment every 3 weeks

## 2018-12-15 NOTE — Progress Notes (Signed)
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[place]) Overall, she is not symptomatic Her last imaging study in March was positive response to therapy Plan to repeat imaging study in September unless she have new symptoms She will continue treatment every 3 weeks   Acquired hypothyroidism Her recent TSH is satisfactory She will continue chronic thyroid replacement therapy We will adjust her thyroid medicine as needed  Restless leg syndrome She has chronic restless leg Repeat iron studies are adequate She will continue her medication as directed  Dermatitis She has recent skin biopsy and was diagnosed with dermatitis, improved with high-dose topical steroids It could be related to immune mediated reaction to pembrolizumab She is not symptomatic She would like to continue pembrolizumab and be monitored closely and I agree with the plan of care.  Mucositis She has some mouth sores I do not believe this is consistent with side effects of pembrolizumab She will continue oral care through her dentist.   No orders of the defined types were placed in this encounter.  Speaker INTERVAL HISTORY: Please see below for problem oriented charting. She returns for further follow-up She has developed some skin lesions and was biopsied by her dermatologist She was prescribed topical steroid She also saw her dentist due to gum/mucositis pain in her cheek She denies cough, chest pain or shortness of breath No abdominal pain no recent changes in bowel habits Her chronic restless leg syndrome is stable  SUMMARY OF ONCOLOGIC HISTORY: Oncology History   MSI stable on June 2017 tissue but high from [facility] study from November 2017  05/15/16: ER is moderately positive (70%). PR is strongly positive (80%).     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 Lymph node, sentinel, biopsy, right obturator ONE BENIGN LYMPH NODE (0/1) 3. Lymph nodes, regional resection, left pelvic FOUR BENIGN LYMPH NODES (0/4)    01/02/2016 Surgery    Dr. Denman George performed robotic-assisted laparoscopic total hysterectomy with bilateral salpingoophorectomy, sentinel lymph node biopsy, lymphadenectomy      05/15/2016 Pathology Results    Vagina, biopsy, mid - ADENOCARCINOMA, SEE COMMENT. Microscopic Comment The morphology along with the patient's history are consistent with recurrent endometrioid adenocarcinoma. The carcinoma has a similar appearance to the primary ([id number]).    05/20/2016 Imaging    Ct scan abdomen showed solid 2.5 cm peritoneal mass in the mid to anterior left pelvis, suspicious for peritoneal metastasis. 2. Small expansile low-attenuation filling defect in the left external iliac vein, cannot exclude a small deep venous thrombus. Consider correlation with left lower extremity venous Doppler scan. 3. Small simple fluid density structure in the left pelvic sidewall abutting the left external iliac vessels, favor a small postoperative seroma. 4. No ascites. 5. No lymphadenopathy.  No metastatic disease in the chest. 6. Aortic atherosclerosis.    06/12/2016 PET scan    Intensely hypermetabolic 2.1 cm central pelvic peritoneal mass just to the left of midline, consistent with peritoneal metastatic recurrence. No ascites. 2. No additional hypermetabolic sites of metastatic disease. 3. Diffuse thyroid hypermetabolism without discrete thyroid nodule, favoring thyroiditis. Recommend correlation with serum thyroid  function  tests.    06/24/2016 - 07/22/2016 Chemotherapy    The patient had weekly cisplatin. She has missed several doses due to infection and pancytopenia    06/24/2016 - 09/04/2016 Radiation Therapy    She completed concurrent radiation therapy Radiation treatment dates:   IMRT : 06/24/16 - 08/01/16 HDR : 08/13/16, 08/20/16, 08/27/16, 09/04/16  Site/dose:   Pelvis treated to 55 Gy in 25 fractions (simultaneous integrated boost technique) Vaginal Cuff treated to 24 Gy in 4 fractions    08/15/2016 - 12/03/2016 Chemotherapy    She received 6 cycles of carboplatin/Taxol    09/27/2016 Imaging    Interval decrease in size of previously described solid peritoneal nodule within the left anterior pelvis. Near complete resolution of previously described low-attenuation structure along the left pelvic sidewall. No evidence for metastatic disease in the chest. Aortic atherosclerosis.    12/30/2016 Imaging    Ct abdomen 1. Solitary left pelvic peritoneal implant is mildly decreased in size in the interval. 2. No new or progressive metastatic disease in the abdomen or pelvis. No ascites. 3. Aortic atherosclerosis.    04/02/2017 Imaging    Left lower quadrant peritoneal implant referenced on previous exam measures 1.7 x 1.3 cm, image 69 of series 2. Increased from 0.8 x 0.8 cm previously peer no new peritoneal implants identified.  Musculoskeletal: The degenerative disc disease noted within the lumbar spine.  IMPRESSION: 1. Solitary left pelvic peritoneal implant is increased in size in the interval. 2. No new sites of disease.  No ascites. 3. Aortic atherosclerosis    04/11/2017 PET scan    1. The left side of pelvis peritoneal implant has decreased in size and degree of FDG uptake compatible with response to therapy. No new areas of peritoneal disease identified. 2. Persistent diffuse increased uptake within the thyroid gland. Correlation with patient's thyroid function may be helpful.    05/26/2017  Imaging    1. Enlarging tumor implant along the left adnexa, currently 2.6 by 2.4 cm and previously 1.9 by 1.3 cm. No new tumor implant or other specific cause for the patient's pelvic symptoms is currently identified. 2.  Aortic Atherosclerosis (ICD10-I70.0). 3. Lumbar spondylosis and degenerative disc disease causing multilevel impingement.    06/04/2017 - 08/28/2017 Chemotherapy    The patient started letrozole and everolimus    08/26/2017 Imaging    Increased size of mass in the left adnexal region.  Several new small liver metastases in right hepatic lobe    09/08/2017 Imaging    LV EF: 60% -  65%    09/10/2017 Procedure    Technically successful right IJ power-injectable port catheter placement. Ready for routine use    12/04/2017 Imaging    1. Interval increase in size and number of multiple lesions within the liver compatible with hepatic metastatic disease. 2. Interval increase in size mass within the left hemipelvis.    12/15/2017 -  Chemotherapy    The patient had pembrolizumab (KEYTRUDA) 200 mg in sodium chloride 0.9 % 50 mL chemo infusion, 200 mg, Intravenous, Once, 1 of 6 cycles Administration: 200 mg (12/15/2017)  for chemotherapy treatment.     12/21/2017 Imaging    1. Moderate left hydronephrosis and hydroureter, similar compared to most recent CT from May 2019; obstruction appears to be secondary to a left pelvic mass/metastatic focus which has increased in size since the prior CT. 2. Numerous hepatic metastatic lesions, suspect that some may be increased in size but difficult to further characterize without intravenous contrast.  Increased size of left pelvic soft tissue mass/metastatic lesion. Mass abuts and possibly invades the sigmoid colon.     02/20/2018 Imaging    Interval decrease in hepatic metastases.  Decreased mass or lymphadenopathy in the left external iliac chain. Interval resolution of left hydroureteronephrosis.  No new or progressive disease within  the abdomen or pelvis.    05/16/2018 Imaging    05/16/2018 CT Abdomen IMPRESSION: 1. Slight interval decrease in size of hepatic metastatic disease. 2. Slight interval decrease in size centrally necrotic mass within the left external iliac region.    09/18/2018 Imaging    1. Today's study demonstrates a mixed response to therapy. Specifically, while the previously noted hepatic metastases appear decreased, the soft tissue mass along the left pelvic sidewall appears slightly larger than the prior study. No new metastatic disease is noted elsewhere in the abdomen or pelvis. 2. Aortic atherosclerosis. 3. Mild cardiomegaly. 4. Additional incidental findings, as above      REVIEW OF SYSTEMS:   Constitutional: Denies fevers, chills or abnormal weight loss Eyes: Denies blurriness of vision Correct respiratory: Denies cough, dyspnea or wheezes Cardiovascular: Denies palpitation, chest discomfort or lower extremity swelling Gastrointestinal:  Denies nausea, heartburn or change in bowel habits Lymphatics: Denies new lymphadenopathy or easy bruising Neurological:Denies numbness, tingling or new weaknesses Behavioral/Psych: Mood is stable, no new changes  All other systems were reviewed with the patient and are negative.  I have reviewed the past medical history, past surgical history, social history and family history with the patient and they are unchanged from previous note.  ALLERGIES:  is allergic to latex.  MEDICATIONS:  Current Outpatient Medications  Medication Sig Dispense Refill  . acetaminophen (TYLENOL) 325 MG tablet Take 650 mg by mouth every 6 (six) hours as needed for mild pain or moderate pain.     Marland Kitchen amoxicillin (AMOXIL) 500 MG capsule Take 2,000 mg by mouth See admin instructions. Prior to dental appointment, takes 4 tables prior to appt    . Biotin w/ Vitamins C & E (HAIR/SKIN/NAILS PO) Take 1 tablet by mouth daily.    . Calcium Carb-Cholecalciferol (CALCIUM 600 + D PO)  Take 2 tablets by mouth daily.    . fluticasone (FLONASE) 50 MCG/ACT nasal spray Place 1 spray into both nostrils 2 (two) times daily as needed for allergies.   1  . gabapentin (NEURONTIN) 600 MG tablet TAKE 1 TABLET BY MOUTH TWICE A DAY (Patient taking differently: Take 1 tablet 2 times a day) 60 tablet 9  . gabapentin (NEURONTIN) 600 MG tablet TAKE 1 TABLET BY MOUTH TWICE A DAY FOR RESTLESS LEGS AND NEUROPATHY.    Marland Kitchen ibuprofen (ADVIL,MOTRIN) 200 MG tablet Take 400 mg by mouth every 8 (eight) hours as needed for mild pain.    Marland Kitchen levothyroxine (SYNTHROID) 175 MCG tablet TAKE 1 TABLET (175 MCG TOTAL) BY MOUTH DAILY BEFORE BREAKFAST. 90 tablet 0  . lidocaine-prilocaine (EMLA) cream Apply 1 application topically as needed. 30 g 6  . loratadine (CLARITIN) 10 MG tablet Take 10 mg by mouth daily.    Marland Kitchen LORazepam (ATIVAN) 1 MG tablet TAKE 1 TABLET BY MOUTH EVERY 8 HOURS AS NEEDED FOR ANXIETY OR SLEEP 90 tablet 0  . losartan-hydrochlorothiazide (HYZAAR) 100-12.5 MG tablet Take 0.5 tablets by mouth daily. 60 tablet 9  . Magnesium 250 MG TABS Take 250 mg by mouth daily.    . magnesium oxide (MAG-OX) 400 (241.3 Mg) MG tablet Take 1 tablet (400 mg total) by mouth 2 (two) times  daily. 60 tablet 9  . magnesium oxide (MAG-OX) 400 MG tablet Take 1 tablet by mouth 2 (two) times daily.    . Melatonin 10 MG CAPS Take 10 mg by mouth at bedtime.    . ondansetron (ZOFRAN) 8 MG tablet Take 1 tablet (8 mg total) by mouth every 8 (eight) hours as needed for nausea. 60 tablet 1  . potassium gluconate 595 (99 K) MG TABS tablet Take by mouth.    . traZODone (DESYREL) 50 MG tablet Take 1.5 tablets (75 mg total) by mouth at bedtime. (Patient taking differently: Take 50 mg by mouth at bedtime. ) 135 tablet 1   No current facility-administered medications for this visit.     PHYSICAL EXAMINATION: ECOG PERFORMANCE STATUS: 1 - Symptomatic but completely ambulatory  Vitals:   12/14/18 1325  BP: (!) 159/89  Pulse: 68  Resp: 18   Temp: 99.1 F (37.3 C)  SpO2: 98%   Filed Weights   12/14/18 1325  Weight: 204 lb 12.8 oz (92.9 kg)    GENERAL:alert, no distress and comfortable SKIN: skin color, texture, turgor are normal, no rashes or significant lesions.  She has small lesions throughout her legs EYES: normal, Conjunctiva are pink and non-injected, sclera clear OROPHARYNX: She had small healed lesion in her cheeks both sides NECK: supple, thyroid normal size, non-tender, without nodularity LYMPH:  no palpable lymphadenopathy in the cervical, axillary or inguinal LUNGS: clear to auscultation and percussion with normal breathing effort HEART: regular rate & rhythm and no murmurs and no lower extremity edema ABDOMEN:abdomen soft, non-tender and normal bowel sounds Musculoskeletal:no cyanosis of digits and no clubbing  NEURO: alert & oriented x 3 with fluent speech, no focal motor/sensory deficits  LABORATORY DATA:  I have reviewed the data as listed    Component Value Date/Time   NA 138 12/14/2018 1322   NA 141 07/09/2017 1312   K 3.8 12/14/2018 1322   K 3.9 07/09/2017 1312   CL 104 12/14/2018 1322   CO2 24 12/14/2018 1322   CO2 27 07/09/2017 1312   GLUCOSE 78 12/14/2018 1322   GLUCOSE 109 07/09/2017 1312   BUN 23 12/14/2018 1322   BUN 16.4 07/09/2017 1312   CREATININE 0.78 12/14/2018 1322   CREATININE 0.8 07/09/2017 1312   CALCIUM 9.0 12/14/2018 1322   CALCIUM 9.2 07/09/2017 1312   PROT 6.8 12/14/2018 1322   PROT 7.1 07/09/2017 1312   ALBUMIN 3.5 12/14/2018 1322   ALBUMIN 3.1 (L) 07/09/2017 1312   AST 19 12/14/2018 1322   AST 35 (H) 07/09/2017 1312   ALT 14 12/14/2018 1322   ALT 43 07/09/2017 1312   ALKPHOS 129 (H) 12/14/2018 1322   ALKPHOS 182 (H) 07/09/2017 1312   BILITOT 0.5 12/14/2018 1322   BILITOT 0.34 07/09/2017 1312   GFRNONAA >60 12/14/2018 1322   GFRAA >60 12/14/2018 1322    No results found for: SPEP, UPEP  Lab Results  Component Value Date   WBC 7.1 12/14/2018   NEUTROABS  4.9 12/14/2018   HGB 12.2 12/14/2018   HCT 37.1 12/14/2018   MCV 88.5 12/14/2018   PLT 163 12/14/2018      Chemistry      Component Value Date/Time   NA 138 12/14/2018 1322   NA 141 07/09/2017 1312   K 3.8 12/14/2018 1322   K 3.9 07/09/2017 1312   CL 104 12/14/2018 1322   CO2 24 12/14/2018 1322   CO2 27 07/09/2017 1312   BUN 23 12/14/2018 1322  BUN 16.4 07/09/2017 1312   CREATININE 0.78 12/14/2018 1322   CREATININE 0.8 07/09/2017 1312      Component Value Date/Time   CALCIUM 9.0 12/14/2018 1322   CALCIUM 9.2 07/09/2017 1312   ALKPHOS 129 (H) 12/14/2018 1322   ALKPHOS 182 (H) 07/09/2017 1312   AST 19 12/14/2018 1322   AST 35 (H) 07/09/2017 1312   ALT 14 12/14/2018 1322   ALT 43 07/09/2017 1312   BILITOT 0.5 12/14/2018 1322   BILITOT 0.34 07/09/2017 1312      I have reviewed her outside pathology report from skin biopsy All questions were answered. The patient knows to call the clinic with any problems, questions or concerns. No barriers to learning was detected.  I spent 25 minutes counseling the patient face to face. The total time spent in the appointment was 30 minutes and more than 50% was on counseling and review of test results  Heath Lark, MD 12/15/2018 8:25 AM

## 2018-12-15 NOTE — Assessment & Plan Note (Signed)
Her recent TSH is satisfactory She will continue chronic thyroid replacement therapy We will adjust her thyroid medicine as needed

## 2018-12-25 ENCOUNTER — Telehealth: Payer: Self-pay | Admitting: *Deleted

## 2018-12-25 NOTE — Telephone Encounter (Signed)
Medication added to list and patient notified.

## 2018-12-25 NOTE — Telephone Encounter (Signed)
Patient went to see her dermatologist. She was prescribed Niacinamide. Is this ok to apply while she is under treatment with Keytruda? The dermatologist was not able to find any contraindications and wanted her to check with oncology.

## 2018-12-25 NOTE — Telephone Encounter (Signed)
Should be fine Can you add to her med list?

## 2019-01-04 ENCOUNTER — Inpatient Hospital Stay: Payer: Medicare Other

## 2019-01-04 ENCOUNTER — Ambulatory Visit: Payer: Medicare Other

## 2019-01-04 ENCOUNTER — Other Ambulatory Visit: Payer: Medicare Other

## 2019-01-04 ENCOUNTER — Other Ambulatory Visit: Payer: Self-pay

## 2019-01-04 VITALS — BP 151/82 | HR 67 | Temp 98.4°F | Resp 18

## 2019-01-04 DIAGNOSIS — C541 Malignant neoplasm of endometrium: Secondary | ICD-10-CM

## 2019-01-04 DIAGNOSIS — C787 Secondary malignant neoplasm of liver and intrahepatic bile duct: Secondary | ICD-10-CM

## 2019-01-04 DIAGNOSIS — E039 Hypothyroidism, unspecified: Secondary | ICD-10-CM

## 2019-01-04 DIAGNOSIS — Z5112 Encounter for antineoplastic immunotherapy: Secondary | ICD-10-CM | POA: Diagnosis not present

## 2019-01-04 LAB — CBC WITH DIFFERENTIAL/PLATELET
Abs Immature Granulocytes: 0.03 10*3/uL (ref 0.00–0.07)
Basophils Absolute: 0 10*3/uL (ref 0.0–0.1)
Basophils Relative: 1 %
Eosinophils Absolute: 0.2 10*3/uL (ref 0.0–0.5)
Eosinophils Relative: 2 %
HCT: 36.3 % (ref 36.0–46.0)
Hemoglobin: 12 g/dL (ref 12.0–15.0)
Immature Granulocytes: 0 %
Lymphocytes Relative: 20 %
Lymphs Abs: 1.3 10*3/uL (ref 0.7–4.0)
MCH: 28.8 pg (ref 26.0–34.0)
MCHC: 33.1 g/dL (ref 30.0–36.0)
MCV: 87.1 fL (ref 80.0–100.0)
Monocytes Absolute: 0.4 10*3/uL (ref 0.1–1.0)
Monocytes Relative: 6 %
Neutro Abs: 4.9 10*3/uL (ref 1.7–7.7)
Neutrophils Relative %: 71 %
Platelets: 184 10*3/uL (ref 150–400)
RBC: 4.17 MIL/uL (ref 3.87–5.11)
RDW: 12.6 % (ref 11.5–15.5)
WBC: 6.9 10*3/uL (ref 4.0–10.5)
nRBC: 0 % (ref 0.0–0.2)

## 2019-01-04 LAB — CMP (CANCER CENTER ONLY)
ALT: 20 U/L (ref 0–44)
AST: 19 U/L (ref 15–41)
Albumin: 3.3 g/dL — ABNORMAL LOW (ref 3.5–5.0)
Alkaline Phosphatase: 138 U/L — ABNORMAL HIGH (ref 38–126)
Anion gap: 10 (ref 5–15)
BUN: 17 mg/dL (ref 8–23)
CO2: 26 mmol/L (ref 22–32)
Calcium: 8.8 mg/dL — ABNORMAL LOW (ref 8.9–10.3)
Chloride: 101 mmol/L (ref 98–111)
Creatinine: 0.91 mg/dL (ref 0.44–1.00)
GFR, Est AFR Am: >60 mL/min (ref >60)
GFR, Estimated: >60 mL/min (ref >60)
Glucose, Bld: 116 mg/dL — ABNORMAL HIGH (ref 70–99)
Potassium: 3.6 mmol/L (ref 3.5–5.1)
Sodium: 137 mmol/L (ref 135–145)
Total Bilirubin: 0.3 mg/dL (ref 0.3–1.2)
Total Protein: 6.7 g/dL (ref 6.5–8.1)

## 2019-01-04 LAB — TSH: TSH: 4.97 u[IU]/mL — ABNORMAL HIGH (ref 0.308–3.960)

## 2019-01-04 MED ORDER — HEPARIN SOD (PORK) LOCK FLUSH 100 UNIT/ML IV SOLN
500.0000 [IU] | Freq: Once | INTRAVENOUS | Status: AC | PRN
  Administered 2019-01-04: 500 [IU]
  Filled 2019-01-04: qty 5

## 2019-01-04 MED ORDER — SODIUM CHLORIDE 0.9 % IV SOLN
Freq: Once | INTRAVENOUS | Status: AC
  Administered 2019-01-04: 15:00:00 via INTRAVENOUS
  Filled 2019-01-04: qty 250

## 2019-01-04 MED ORDER — SODIUM CHLORIDE 0.9% FLUSH
10.0000 mL | INTRAVENOUS | Status: DC | PRN
  Administered 2019-01-04: 10 mL
  Filled 2019-01-04: qty 10

## 2019-01-04 MED ORDER — SODIUM CHLORIDE 0.9 % IV SOLN
200.0000 mg | Freq: Once | INTRAVENOUS | Status: AC
  Administered 2019-01-04: 200 mg via INTRAVENOUS
  Filled 2019-01-04: qty 8

## 2019-01-04 MED ORDER — SODIUM CHLORIDE 0.9% FLUSH
10.0000 mL | Freq: Once | INTRAVENOUS | Status: AC
  Administered 2019-01-04: 10 mL
  Filled 2019-01-04: qty 10

## 2019-01-07 ENCOUNTER — Other Ambulatory Visit: Payer: Self-pay | Admitting: Hematology and Oncology

## 2019-01-11 ENCOUNTER — Telehealth: Payer: Self-pay | Admitting: Hematology and Oncology

## 2019-01-11 NOTE — Telephone Encounter (Signed)
Returned patient's phone call regarding rescheduling an appointment, left a voicemail. 

## 2019-01-13 ENCOUNTER — Telehealth: Payer: Self-pay | Admitting: Hematology and Oncology

## 2019-01-13 NOTE — Telephone Encounter (Signed)
Patient called to reschedule August appointment, appointments have been moved from 08/10 to 08/17.

## 2019-01-13 NOTE — Telephone Encounter (Signed)
I tried calling patient regarding message sent to me from [place], and she was not available. I did leave the message to call me back about 8/10

## 2019-01-25 ENCOUNTER — Inpatient Hospital Stay: Payer: Medicare Other

## 2019-01-25 ENCOUNTER — Telehealth: Payer: Self-pay | Admitting: *Deleted

## 2019-01-25 ENCOUNTER — Inpatient Hospital Stay (HOSPITAL_BASED_OUTPATIENT_CLINIC_OR_DEPARTMENT_OTHER): Payer: Medicare Other | Admitting: Hematology and Oncology

## 2019-01-25 ENCOUNTER — Inpatient Hospital Stay: Payer: Medicare Other | Attending: Hematology and Oncology

## 2019-01-25 ENCOUNTER — Other Ambulatory Visit: Payer: Self-pay

## 2019-01-25 DIAGNOSIS — C541 Malignant neoplasm of endometrium: Secondary | ICD-10-CM

## 2019-01-25 DIAGNOSIS — Z5112 Encounter for antineoplastic immunotherapy: Secondary | ICD-10-CM | POA: Insufficient documentation

## 2019-01-25 DIAGNOSIS — C787 Secondary malignant neoplasm of liver and intrahepatic bile duct: Secondary | ICD-10-CM | POA: Diagnosis not present

## 2019-01-25 DIAGNOSIS — G2581 Restless legs syndrome: Secondary | ICD-10-CM | POA: Diagnosis not present

## 2019-01-25 DIAGNOSIS — E039 Hypothyroidism, unspecified: Secondary | ICD-10-CM

## 2019-01-25 LAB — CBC WITH DIFFERENTIAL/PLATELET
Abs Immature Granulocytes: 0.02 10*3/uL (ref 0.00–0.07)
Basophils Absolute: 0 10*3/uL (ref 0.0–0.1)
Basophils Relative: 0 %
Eosinophils Absolute: 0.1 10*3/uL (ref 0.0–0.5)
Eosinophils Relative: 2 %
HCT: 38.2 % (ref 36.0–46.0)
Hemoglobin: 12.6 g/dL (ref 12.0–15.0)
Immature Granulocytes: 0 %
Lymphocytes Relative: 23 %
Lymphs Abs: 1.6 10*3/uL (ref 0.7–4.0)
MCH: 28.2 pg (ref 26.0–34.0)
MCHC: 33 g/dL (ref 30.0–36.0)
MCV: 85.5 fL (ref 80.0–100.0)
Monocytes Absolute: 0.5 10*3/uL (ref 0.1–1.0)
Monocytes Relative: 7 %
Neutro Abs: 4.6 10*3/uL (ref 1.7–7.7)
Neutrophils Relative %: 68 %
Platelets: 180 10*3/uL (ref 150–400)
RBC: 4.47 MIL/uL (ref 3.87–5.11)
RDW: 13.2 % (ref 11.5–15.5)
WBC: 6.8 10*3/uL (ref 4.0–10.5)
nRBC: 0 % (ref 0.0–0.2)

## 2019-01-25 LAB — COMPREHENSIVE METABOLIC PANEL
ALT: 17 U/L (ref 0–44)
AST: 15 U/L (ref 15–41)
Albumin: 3.5 g/dL (ref 3.5–5.0)
Alkaline Phosphatase: 111 U/L (ref 38–126)
Anion gap: 8 (ref 5–15)
BUN: 29 mg/dL — ABNORMAL HIGH (ref 8–23)
CO2: 26 mmol/L (ref 22–32)
Calcium: 9.2 mg/dL (ref 8.9–10.3)
Chloride: 105 mmol/L (ref 98–111)
Creatinine, Ser: 0.83 mg/dL (ref 0.44–1.00)
GFR calc Af Amer: >60 mL/min (ref >60)
GFR calc non Af Amer: >60 mL/min (ref >60)
Glucose, Bld: 93 mg/dL (ref 70–99)
Potassium: 3.9 mmol/L (ref 3.5–5.1)
Sodium: 139 mmol/L (ref 135–145)
Total Bilirubin: 0.7 mg/dL (ref 0.3–1.2)
Total Protein: 6.7 g/dL (ref 6.5–8.1)

## 2019-01-25 LAB — TSH: TSH: 6.754 u[IU]/mL — ABNORMAL HIGH (ref 0.308–3.960)

## 2019-01-25 MED ORDER — SODIUM CHLORIDE 0.9% FLUSH
10.0000 mL | INTRAVENOUS | Status: DC | PRN
  Administered 2019-01-25: 10 mL
  Filled 2019-01-25: qty 10

## 2019-01-25 MED ORDER — SODIUM CHLORIDE 0.9% FLUSH
10.0000 mL | Freq: Once | INTRAVENOUS | Status: AC
  Administered 2019-01-25: 10 mL
  Filled 2019-01-25: qty 10

## 2019-01-25 MED ORDER — LEVOTHYROXINE SODIUM 200 MCG PO TABS: 200.0000 ug | ORAL_TABLET | Freq: Every day | ORAL | 0 refills | Status: DC

## 2019-01-25 MED ORDER — HEPARIN SOD (PORK) LOCK FLUSH 100 UNIT/ML IV SOLN
500.0000 [IU] | Freq: Once | INTRAVENOUS | Status: AC | PRN
  Administered 2019-01-25: 500 [IU]
  Filled 2019-01-25: qty 5

## 2019-01-25 MED ORDER — SODIUM CHLORIDE 0.9 % IV SOLN
200.0000 mg | Freq: Once | INTRAVENOUS | Status: AC
  Administered 2019-01-25: 11:00:00 200 mg via INTRAVENOUS
  Filled 2019-01-25: qty 8

## 2019-01-25 MED ORDER — SODIUM CHLORIDE 0.9 % IV SOLN
Freq: Once | INTRAVENOUS | Status: AC
  Administered 2019-01-25: 10:00:00 via INTRAVENOUS
  Filled 2019-01-25: qty 250

## 2019-01-25 NOTE — Patient Instructions (Signed)

## 2019-01-25 NOTE — Telephone Encounter (Signed)
Lab results discussed with patient. She confirms she has been taking thyroid medication as prescribed. Confirmed empty stomach and not within 2 hours of other medications. Per Dr. Alvy Bimler 251mcg Levothyroxine sent to her [facility] pharmacy. She prefers 90 day supply. Patient understands to call this office with any concerns or questions.

## 2019-01-25 NOTE — Patient Instructions (Signed)
[facility] Discharge Instructions for Patients Receiving Chemotherapy  Today you received the following chemotherapy agents Pembrolizumab (KEYTRUDA).  To help prevent nausea and vomiting after your treatment, we encourage you to take your nausea medication as prescribed.   If you develop nausea and vomiting that is not controlled by your nausea medication, call the clinic.   BELOW ARE SYMPTOMS THAT SHOULD BE REPORTED IMMEDIATELY:  *FEVER GREATER THAN 100.5 F  *CHILLS WITH OR WITHOUT FEVER  NAUSEA AND VOMITING THAT IS NOT CONTROLLED WITH YOUR NAUSEA MEDICATION  *UNUSUAL SHORTNESS OF BREATH  *UNUSUAL BRUISING OR BLEEDING  TENDERNESS IN MOUTH AND THROAT WITH OR WITHOUT PRESENCE OF ULCERS  *URINARY PROBLEMS  *BOWEL PROBLEMS  UNUSUAL RASH Items with * indicate a potential emergency and should be followed up as soon as possible.  Feel free to call the clinic should you have any questions or concerns. The clinic phone number is [phone].  Please show the CHEMO ALERT CARD at check-in to the Emergency Department and triage nurse.  Coronavirus (COVID-19) Are you at risk?  Are you at risk for the Coronavirus (COVID-19)?  To be considered HIGH RISK for Coronavirus (COVID-19), you have to meet the following criteria:  . Traveled to [place], [place], [place], [place] or [place]; or in the [place] to [place], [place], [place], or [place]; and have fever, cough, and shortness of breath within the last 2 weeks of travel OR . Been in close contact with a person diagnosed with COVID-19 within the last 2 weeks and have fever, cough, and shortness of breath . IF YOU DO NOT MEET THESE CRITERIA, YOU ARE CONSIDERED LOW RISK FOR COVID-19.  What to do if you are HIGH RISK for COVID-19?  . If you are having a medical emergency, call 911. . Seek medical care right away. Before you go to a doctor's office, urgent care or emergency department, call ahead and tell  them about your recent travel, contact with someone diagnosed with COVID-19, and your symptoms. You should receive instructions from your physician's office regarding next steps of care.  . When you arrive at healthcare provider, tell the healthcare staff immediately you have returned from visiting [place], [place], [place], [place] or [place]; or traveled in the [place] to [place], [place], [place], or [place]; in the last two weeks or you have been in close contact with a person diagnosed with COVID-19 in the last 2 weeks.   . Tell the health care staff about your symptoms: fever, cough and shortness of breath. . After you have been seen by a medical provider, you will be either: o Tested for (COVID-19) and discharged home on quarantine except to seek medical care if symptoms worsen, and asked to  - Stay home and avoid contact with others until you get your results (4-5 days)  - Avoid travel on public transportation if possible (such as bus, train, or airplane) or o Sent to the Emergency Department by EMS for evaluation, COVID-19 testing, and possible admission depending on your condition and test results.  What to do if you are LOW RISK for COVID-19?  Reduce your risk of any infection by using the same precautions used for avoiding the common cold or flu:  . Wash your hands often with soap and warm water for at least 20 seconds.  If soap and water are not readily available, use an alcohol-based hand sanitizer with at least 60% alcohol.  . If coughing or   sneezing, cover your mouth and nose by coughing or sneezing into the elbow areas of your shirt or coat, into a tissue or into your sleeve (not your hands). . Avoid shaking hands with others and consider head nods or verbal greetings only. . Avoid touching your eyes, nose, or mouth with unwashed hands.  . Avoid close contact with people who are sick. . Avoid places or events with large numbers of people in one location, like concerts or  sporting events. . Carefully consider travel plans you have or are making. . If you are planning any travel outside or inside the [place], visit the CDC's Travelers' Health webpage for the latest health notices. . If you have some symptoms but not all symptoms, continue to monitor at home and seek medical attention if your symptoms worsen. . If you are having a medical emergency, call 911.   ADDITIONAL HEALTHCARE OPTIONS FOR PATIENTS  [facility] / e-Visit: [email or web address]         [facility]: [phone]  [facility]: [phone]                   [facility]: [phone]    

## 2019-01-25 NOTE — Telephone Encounter (Signed)
-----   Message from Heath Lark, MD sent at 01/25/2019 11:03 AM EDT ----- Regarding: high TSH Is she taking her thyroid medication consistently? If not, continue same dose If yes, she will need increase to 200 mcg, ok to call in 30 days, 3 refills

## 2019-01-25 NOTE — Telephone Encounter (Signed)
Left message for return call.

## 2019-01-26 ENCOUNTER — Telehealth: Payer: Self-pay | Admitting: Hematology and Oncology

## 2019-01-26 ENCOUNTER — Encounter: Payer: Self-pay | Admitting: Hematology and Oncology

## 2019-01-26 NOTE — Assessment & Plan Note (Signed)
Her recent TSH is satisfactory She will continue chronic thyroid replacement therapy We will adjust her thyroid medicine as needed

## 2019-01-26 NOTE — Assessment & Plan Note (Signed)
She has chronic restless leg Repeat iron studies are adequate She will continue her medication as directed

## 2019-01-26 NOTE — Telephone Encounter (Signed)
Added appt per 7/21 sch message - pt to get an updated schedule next visit.

## 2019-01-26 NOTE — Assessment & Plan Note (Signed)
Overall, she is not symptomatic Her last imaging study in March was positive response to therapy Plan to repeat imaging study in September unless she have new symptoms She will continue treatment every 3 weeks

## 2019-01-26 NOTE — Progress Notes (Signed)
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 CT ABDOMEN PELVIS W CONTRAST    Standing Status:   Future    Standing Expiration Date:   01/26/2020    Order Specific Question:   If indicated for the ordered procedure, I authorize the administration of contrast media per Radiology protocol    Answer:   Yes    Order Specific Question:   Preferred imaging location?    Answer:   [facility]    Order Specific Question:   Radiology Contrast Protocol - do NOT remove file path    Answer:   [email or web address]    INTERVAL HISTORY: Please see below for problem oriented charting. She returns for further follow-up She feels well She has some restless leg but tolerated chemotherapy well without infusion reaction No recent infection, fever or chills  SUMMARY OF ONCOLOGIC HISTORY: Oncology History Overview Note  MSI stable on June 2017 tissue but high from [facility] study from November 2017  05/15/16: ER is moderately positive (70%). PR is strongly positive (80%).   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 Lymph node, sentinel, biopsy, right obturator ONE BENIGN LYMPH NODE (0/1) 3. Lymph nodes, regional resection, left pelvic FOUR BENIGN LYMPH NODES (0/4)   01/02/2016 Surgery   Dr. Denman George performed robotic-assisted laparoscopic total hysterectomy with bilateral salpingoophorectomy, sentinel lymph node biopsy, lymphadenectomy     05/15/2016 Pathology Results   Vagina, biopsy, mid - ADENOCARCINOMA, SEE COMMENT. Microscopic Comment The morphology along with the patient's history are consistent with recurrent endometrioid adenocarcinoma. The carcinoma has a similar appearance to the primary ([id number]).   05/20/2016 Imaging   Ct scan abdomen showed solid 2.5 cm peritoneal mass in the mid to anterior left pelvis, suspicious for peritoneal metastasis. 2. Small expansile low-attenuation filling defect in the left external iliac vein, cannot exclude a small deep venous thrombus. Consider correlation with left lower extremity venous Doppler scan. 3. Small simple fluid density structure in the left pelvic sidewall abutting the left external iliac vessels, favor a small postoperative seroma. 4. No ascites. 5. No lymphadenopathy.  No metastatic disease in the chest. 6. Aortic atherosclerosis.   06/12/2016 PET scan   Intensely hypermetabolic 2.1 cm central pelvic peritoneal mass just to the left of midline, consistent with peritoneal metastatic recurrence. No ascites. 2. No additional hypermetabolic sites of metastatic disease. 3. Diffuse thyroid hypermetabolism without discrete thyroid nodule, favoring thyroiditis. Recommend correlation with serum thyroid function tests.   06/24/2016 - 07/22/2016 Chemotherapy   The patient had weekly cisplatin. She has missed several doses due to infection and  pancytopenia   06/24/2016 - 09/04/2016 Radiation  Therapy   She completed concurrent radiation therapy Radiation treatment dates:   IMRT : 06/24/16 - 08/01/16 HDR : 08/13/16, 08/20/16, 08/27/16, 09/04/16  Site/dose:   Pelvis treated to 55 Gy in 25 fractions (simultaneous integrated boost technique) Vaginal Cuff treated to 24 Gy in 4 fractions   08/15/2016 - 12/03/2016 Chemotherapy   She received 6 cycles of carboplatin/Taxol   09/27/2016 Imaging   Interval decrease in size of previously described solid peritoneal nodule within the left anterior pelvis. Near complete resolution of previously described low-attenuation structure along the left pelvic sidewall. No evidence for metastatic disease in the chest. Aortic atherosclerosis.   12/30/2016 Imaging   Ct abdomen 1. Solitary left pelvic peritoneal implant is mildly decreased in size in the interval. 2. No new or progressive metastatic disease in the abdomen or pelvis. No ascites. 3. Aortic atherosclerosis.   04/02/2017 Imaging   Left lower quadrant peritoneal implant referenced on previous exam measures 1.7 x 1.3 cm, image 69 of series 2. Increased from 0.8 x 0.8 cm previously peer no new peritoneal implants identified.  Musculoskeletal: The degenerative disc disease noted within the lumbar spine.  IMPRESSION: 1. Solitary left pelvic peritoneal implant is increased in size in the interval. 2. No new sites of disease.  No ascites. 3. Aortic atherosclerosis   04/11/2017 PET scan   1. The left side of pelvis peritoneal implant has decreased in size and degree of FDG uptake compatible with response to therapy. No new areas of peritoneal disease identified. 2. Persistent diffuse increased uptake within the thyroid gland. Correlation with patient's thyroid function may be helpful.   05/26/2017 Imaging   1. Enlarging tumor implant along the left adnexa, currently 2.6 by 2.4 cm and previously 1.9 by 1.3 cm. No new tumor implant or other specific cause for the patient's pelvic symptoms is  currently identified. 2.  Aortic Atherosclerosis (ICD10-I70.0). 3. Lumbar spondylosis and degenerative disc disease causing multilevel impingement.   06/04/2017 - 08/28/2017 Chemotherapy   The patient started letrozole and everolimus   08/26/2017 Imaging   Increased size of mass in the left adnexal region.  Several new small liver metastases in right hepatic lobe   09/08/2017 Imaging   LV EF: 60% -  65%   09/10/2017 Procedure   Technically successful right IJ power-injectable port catheter placement. Ready for routine use   12/04/2017 Imaging   1. Interval increase in size and number of multiple lesions within the liver compatible with hepatic metastatic disease. 2. Interval increase in size mass within the left hemipelvis.   12/15/2017 -  Chemotherapy   The patient had pembrolizumab (KEYTRUDA) 200 mg in sodium chloride 0.9 % 50 mL chemo infusion, 200 mg, Intravenous, Once, 1 of 6 cycles Administration: 200 mg (12/15/2017)  for chemotherapy treatment.    12/21/2017 Imaging   1. Moderate left hydronephrosis and hydroureter, similar compared to most recent CT from May 2019; obstruction appears to be secondary to a left pelvic mass/metastatic focus which has increased in size since the prior CT. 2. Numerous hepatic metastatic lesions, suspect that some may be increased in size but difficult to further characterize without intravenous contrast. Increased size of left pelvic soft tissue mass/metastatic lesion. Mass abuts and possibly invades the sigmoid colon.    02/20/2018 Imaging   Interval decrease in hepatic metastases.  Decreased mass or lymphadenopathy in the left external iliac chain. Interval resolution of left hydroureteronephrosis.  No new or progressive disease within the abdomen  or pelvis.   05/16/2018 Imaging   05/16/2018 CT Abdomen IMPRESSION: 1. Slight interval decrease in size of hepatic metastatic disease. 2. Slight interval decrease in size centrally necrotic mass  within the left external iliac region.   09/18/2018 Imaging   1. Today's study demonstrates a mixed response to therapy. Specifically, while the previously noted hepatic metastases appear decreased, the soft tissue mass along the left pelvic sidewall appears slightly larger than the prior study. No new metastatic disease is noted elsewhere in the abdomen or pelvis. 2. Aortic atherosclerosis. 3. Mild cardiomegaly. 4. Additional incidental findings, as above      REVIEW OF SYSTEMS:   Constitutional: Denies fevers, chills or abnormal weight loss Eyes: Denies blurriness of vision Ears, nose, mouth, throat, and face: Denies mucositis or sore throat Respiratory: Denies cough, dyspnea or wheezes Cardiovascular: Denies palpitation, chest discomfort or lower extremity swelling Gastrointestinal:  Denies nausea, heartburn or change in bowel habits Skin: Denies abnormal skin rashes Lymphatics: Denies new lymphadenopathy or easy bruising Neurological:Denies numbness, tingling or new weaknesses Behavioral/Psych: Mood is stable, no new changes  All other systems were reviewed with the patient and are negative.  I have reviewed the past medical history, past surgical history, social history and family history with the patient and they are unchanged from previous note.  ALLERGIES:  is allergic to latex.  MEDICATIONS:  Current Outpatient Medications  Medication Sig Dispense Refill  . acetaminophen (TYLENOL) 325 MG tablet Take 650 mg by mouth every 6 (six) hours as needed for mild pain or moderate pain.     Marland Kitchen amoxicillin (AMOXIL) 500 MG capsule Take 2,000 mg by mouth See admin instructions. Prior to dental appointment, takes 4 tables prior to appt    . Biotin w/ Vitamins C & E (HAIR/SKIN/NAILS PO) Take 1 tablet by mouth daily.    . Calcium Carb-Cholecalciferol (CALCIUM 600 + D PO) Take 2 tablets by mouth daily.    . fluticasone (FLONASE) 50 MCG/ACT nasal spray Place 1 spray into both nostrils 2  (two) times daily as needed for allergies.   1  . gabapentin (NEURONTIN) 600 MG tablet TAKE 1 TABLET BY MOUTH TWICE A DAY (Patient taking differently: Take 1 tablet 2 times a day) 60 tablet 9  . gabapentin (NEURONTIN) 600 MG tablet TAKE 1 TABLET BY MOUTH TWICE A DAY FOR RESTLESS LEGS AND NEUROPATHY.    Marland Kitchen ibuprofen (ADVIL,MOTRIN) 200 MG tablet Take 400 mg by mouth every 8 (eight) hours as needed for mild pain.    Marland Kitchen levothyroxine (SYNTHROID) 200 MCG tablet Take 1 tablet (200 mcg total) by mouth daily before breakfast. 90 tablet 0  . lidocaine-prilocaine (EMLA) cream Apply 1 application topically as needed. 30 g 6  . loratadine (CLARITIN) 10 MG tablet Take 10 mg by mouth daily.    Marland Kitchen LORazepam (ATIVAN) 1 MG tablet TAKE 1 TABLET BY MOUTH EVERY 8 HOURS AS NEEDED FOR ANXIETY OR SLEEP 90 tablet 0  . losartan-hydrochlorothiazide (HYZAAR) 100-12.5 MG tablet Take 0.5 tablets by mouth daily. 60 tablet 9  . Magnesium 250 MG TABS Take 250 mg by mouth daily.    . magnesium oxide (MAG-OX) 400 (241.3 Mg) MG tablet Take 1 tablet (400 mg total) by mouth 2 (two) times daily. 60 tablet 9  . magnesium oxide (MAG-OX) 400 MG tablet Take 1 tablet by mouth 2 (two) times daily.    . Melatonin 10 MG CAPS Take 10 mg by mouth at bedtime.    . niacinamide 500 MG tablet Take  500 mg by mouth 3 (three) times daily with meals.    . ondansetron (ZOFRAN) 8 MG tablet Take 1 tablet (8 mg total) by mouth every 8 (eight) hours as needed for nausea. 60 tablet 1  . potassium gluconate 595 (99 K) MG TABS tablet Take by mouth.    . traZODone (DESYREL) 50 MG tablet Take 1.5 tablets (75 mg total) by mouth at bedtime. (Patient taking differently: Take 50 mg by mouth at bedtime. ) 135 tablet 1   No current facility-administered medications for this visit.     PHYSICAL EXAMINATION: ECOG PERFORMANCE STATUS: 1 - Symptomatic but completely ambulatory  Vitals:   01/25/19 0958  BP: (!) 142/77  Pulse: 76  Resp: 18  Temp: 99.1 F (37.3 C)   SpO2: 99%   Filed Weights   01/25/19 0958  Weight: 200 lb 3.2 oz (90.8 kg)    GENERAL:alert, no distress and comfortable SKIN: skin color, texture, turgor are normal, no rashes or significant lesions EYES: normal, Conjunctiva are pink and non-injected, sclera clear OROPHARYNX:no exudate, no erythema and lips, buccal mucosa, and tongue normal  NECK: supple, thyroid normal size, non-tender, without nodularity LYMPH:  no palpable lymphadenopathy in the cervical, axillary or inguinal LUNGS: clear to auscultation and percussion with normal breathing effort HEART: regular rate & rhythm and no murmurs and no lower extremity edema ABDOMEN:abdomen soft, non-tender and normal bowel sounds Musculoskeletal:no cyanosis of digits and no clubbing  NEURO: alert & oriented x 3 with fluent speech, no focal motor/sensory deficits  LABORATORY DATA:  I have reviewed the data as listed    Component Value Date/Time   NA 139 01/25/2019 0928   NA 141 07/09/2017 1312   K 3.9 01/25/2019 0928   K 3.9 07/09/2017 1312   CL 105 01/25/2019 0928   CO2 26 01/25/2019 0928   CO2 27 07/09/2017 1312   GLUCOSE 93 01/25/2019 0928   GLUCOSE 109 07/09/2017 1312   BUN 29 (H) 01/25/2019 0928   BUN 16.4 07/09/2017 1312   CREATININE 0.83 01/25/2019 0928   CREATININE 0.91 01/04/2019 1351   CREATININE 0.8 07/09/2017 1312   CALCIUM 9.2 01/25/2019 0928   CALCIUM 9.2 07/09/2017 1312   PROT 6.7 01/25/2019 0928   PROT 7.1 07/09/2017 1312   ALBUMIN 3.5 01/25/2019 0928   ALBUMIN 3.1 (L) 07/09/2017 1312   AST 15 01/25/2019 0928   AST 19 01/04/2019 1351   AST 35 (H) 07/09/2017 1312   ALT 17 01/25/2019 0928   ALT 20 01/04/2019 1351   ALT 43 07/09/2017 1312   ALKPHOS 111 01/25/2019 0928   ALKPHOS 182 (H) 07/09/2017 1312   BILITOT 0.7 01/25/2019 0928   BILITOT 0.3 01/04/2019 1351   BILITOT 0.34 07/09/2017 1312   GFRNONAA >60 01/25/2019 0928   GFRNONAA >60 01/04/2019 1351   GFRAA >60 01/25/2019 0928   GFRAA >60  01/04/2019 1351    No results found for: SPEP, UPEP  Lab Results  Component Value Date   WBC 6.8 01/25/2019   NEUTROABS 4.6 01/25/2019   HGB 12.6 01/25/2019   HCT 38.2 01/25/2019   MCV 85.5 01/25/2019   PLT 180 01/25/2019      Chemistry      Component Value Date/Time   NA 139 01/25/2019 0928   NA 141 07/09/2017 1312   K 3.9 01/25/2019 0928   K 3.9 07/09/2017 1312   CL 105 01/25/2019 0928   CO2 26 01/25/2019 0928   CO2 27 07/09/2017 1312   BUN 29 (H)  01/25/2019 0928   BUN 16.4 07/09/2017 1312   CREATININE 0.83 01/25/2019 0928   CREATININE 0.91 01/04/2019 1351   CREATININE 0.8 07/09/2017 1312      Component Value Date/Time   CALCIUM 9.2 01/25/2019 0928   CALCIUM 9.2 07/09/2017 1312   ALKPHOS 111 01/25/2019 0928   ALKPHOS 182 (H) 07/09/2017 1312   AST 15 01/25/2019 0928   AST 19 01/04/2019 1351   AST 35 (H) 07/09/2017 1312   ALT 17 01/25/2019 0928   ALT 20 01/04/2019 1351   ALT 43 07/09/2017 1312   BILITOT 0.7 01/25/2019 0928   BILITOT 0.3 01/04/2019 1351   BILITOT 0.34 07/09/2017 1312       All questions were answered. The patient knows to call the clinic with any problems, questions or concerns. No barriers to learning was detected.  I spent 15 minutes counseling the patient face to face. The total time spent in the appointment was 20 minutes and more than 50% was on counseling and review of test results  Heath Lark, MD 01/26/2019 2:42 PM

## 2019-02-15 ENCOUNTER — Ambulatory Visit: Payer: Medicare Other

## 2019-02-15 ENCOUNTER — Other Ambulatory Visit: Payer: Medicare Other

## 2019-02-22 ENCOUNTER — Other Ambulatory Visit: Payer: Self-pay | Admitting: Hematology and Oncology

## 2019-02-22 ENCOUNTER — Inpatient Hospital Stay: Payer: Medicare Other | Attending: Hematology and Oncology

## 2019-02-22 ENCOUNTER — Inpatient Hospital Stay: Payer: Medicare Other

## 2019-02-22 ENCOUNTER — Other Ambulatory Visit: Payer: Self-pay

## 2019-02-22 VITALS — BP 119/72 | HR 78 | Temp 98.7°F | Resp 16 | Wt 204.6 lb

## 2019-02-22 DIAGNOSIS — E039 Hypothyroidism, unspecified: Secondary | ICD-10-CM | POA: Diagnosis not present

## 2019-02-22 DIAGNOSIS — Z5112 Encounter for antineoplastic immunotherapy: Secondary | ICD-10-CM | POA: Diagnosis present

## 2019-02-22 DIAGNOSIS — C787 Secondary malignant neoplasm of liver and intrahepatic bile duct: Secondary | ICD-10-CM

## 2019-02-22 DIAGNOSIS — C541 Malignant neoplasm of endometrium: Secondary | ICD-10-CM

## 2019-02-22 LAB — CBC WITH DIFFERENTIAL/PLATELET
Abs Immature Granulocytes: 0.02 10*3/uL (ref 0.00–0.07)
Basophils Absolute: 0 10*3/uL (ref 0.0–0.1)
Basophils Relative: 1 %
Eosinophils Absolute: 0.1 10*3/uL (ref 0.0–0.5)
Eosinophils Relative: 2 %
HCT: 35.7 % — ABNORMAL LOW (ref 36.0–46.0)
Hemoglobin: 11.9 g/dL — ABNORMAL LOW (ref 12.0–15.0)
Immature Granulocytes: 0 %
Lymphocytes Relative: 19 %
Lymphs Abs: 1.3 10*3/uL (ref 0.7–4.0)
MCH: 29.1 pg (ref 26.0–34.0)
MCHC: 33.3 g/dL (ref 30.0–36.0)
MCV: 87.3 fL (ref 80.0–100.0)
Monocytes Absolute: 0.4 10*3/uL (ref 0.1–1.0)
Monocytes Relative: 7 %
Neutro Abs: 4.8 10*3/uL (ref 1.7–7.7)
Neutrophils Relative %: 71 %
Platelets: 175 10*3/uL (ref 150–400)
RBC: 4.09 MIL/uL (ref 3.87–5.11)
RDW: 13.7 % (ref 11.5–15.5)
WBC: 6.7 10*3/uL (ref 4.0–10.5)
nRBC: 0 % (ref 0.0–0.2)

## 2019-02-22 LAB — COMPREHENSIVE METABOLIC PANEL
ALT: 26 U/L (ref 0–44)
AST: 21 U/L (ref 15–41)
Albumin: 3.5 g/dL (ref 3.5–5.0)
Alkaline Phosphatase: 88 U/L (ref 38–126)
Anion gap: 9 (ref 5–15)
BUN: 23 mg/dL (ref 8–23)
CO2: 25 mmol/L (ref 22–32)
Calcium: 9 mg/dL (ref 8.9–10.3)
Chloride: 103 mmol/L (ref 98–111)
Creatinine, Ser: 0.89 mg/dL (ref 0.44–1.00)
GFR calc Af Amer: >60 mL/min (ref >60)
GFR calc non Af Amer: >60 mL/min (ref >60)
Glucose, Bld: 103 mg/dL — ABNORMAL HIGH (ref 70–99)
Potassium: 3.7 mmol/L (ref 3.5–5.1)
Sodium: 137 mmol/L (ref 135–145)
Total Bilirubin: 0.8 mg/dL (ref 0.3–1.2)
Total Protein: 6.4 g/dL — ABNORMAL LOW (ref 6.5–8.1)

## 2019-02-22 LAB — TSH: TSH: 1.396 u[IU]/mL (ref 0.308–3.960)

## 2019-02-22 MED ORDER — HEPARIN SOD (PORK) LOCK FLUSH 100 UNIT/ML IV SOLN
500.0000 [IU] | Freq: Once | INTRAVENOUS | Status: AC | PRN
  Administered 2019-02-22: 500 [IU]
  Filled 2019-02-22: qty 5

## 2019-02-22 MED ORDER — SODIUM CHLORIDE 0.9 % IV SOLN
Freq: Once | INTRAVENOUS | Status: AC
  Administered 2019-02-22: 15:00:00 via INTRAVENOUS
  Filled 2019-02-22: qty 250

## 2019-02-22 MED ORDER — SODIUM CHLORIDE 0.9% FLUSH
10.0000 mL | INTRAVENOUS | Status: DC | PRN
  Administered 2019-02-22: 16:00:00 10 mL
  Filled 2019-02-22: qty 10

## 2019-02-22 MED ORDER — SODIUM CHLORIDE 0.9% FLUSH
10.0000 mL | Freq: Once | INTRAVENOUS | Status: AC
  Administered 2019-02-22: 10 mL
  Filled 2019-02-22: qty 10

## 2019-02-22 MED ORDER — SODIUM CHLORIDE 0.9 % IV SOLN
200.0000 mg | Freq: Once | INTRAVENOUS | Status: AC
  Administered 2019-02-22: 200 mg via INTRAVENOUS
  Filled 2019-02-22: qty 8

## 2019-02-22 NOTE — Patient Instructions (Signed)
[facility] Discharge Instructions for Patients Receiving Chemotherapy  Today you received the following chemotherapy agents Pembrolizumab (KEYTRUDA).  To help prevent nausea and vomiting after your treatment, we encourage you to take your nausea medication as prescribed.   If you develop nausea and vomiting that is not controlled by your nausea medication, call the clinic.   BELOW ARE SYMPTOMS THAT SHOULD BE REPORTED IMMEDIATELY:  *FEVER GREATER THAN 100.5 F  *CHILLS WITH OR WITHOUT FEVER  NAUSEA AND VOMITING THAT IS NOT CONTROLLED WITH YOUR NAUSEA MEDICATION  *UNUSUAL SHORTNESS OF BREATH  *UNUSUAL BRUISING OR BLEEDING  TENDERNESS IN MOUTH AND THROAT WITH OR WITHOUT PRESENCE OF ULCERS  *URINARY PROBLEMS  *BOWEL PROBLEMS  UNUSUAL RASH Items with * indicate a potential emergency and should be followed up as soon as possible.  Feel free to call the clinic should you have any questions or concerns. The clinic phone number is [phone].  Please show the CHEMO ALERT CARD at check-in to the Emergency Department and triage nurse.  Coronavirus (COVID-19) Are you at risk?  Are you at risk for the Coronavirus (COVID-19)?  To be considered HIGH RISK for Coronavirus (COVID-19), you have to meet the following criteria:  . Traveled to [place], [place], [place], [place] or [place]; or in the [place] to [place], [place], [place], or [place]; and have fever, cough, and shortness of breath within the last 2 weeks of travel OR . Been in close contact with a person diagnosed with COVID-19 within the last 2 weeks and have fever, cough, and shortness of breath . IF YOU DO NOT MEET THESE CRITERIA, YOU ARE CONSIDERED LOW RISK FOR COVID-19.  What to do if you are HIGH RISK for COVID-19?  . If you are having a medical emergency, call 911. . Seek medical care right away. Before you go to a doctor's office, urgent care or emergency department, call ahead and tell  them about your recent travel, contact with someone diagnosed with COVID-19, and your symptoms. You should receive instructions from your physician's office regarding next steps of care.  . When you arrive at healthcare provider, tell the healthcare staff immediately you have returned from visiting [place], [place], [place], [place] or [place]; or traveled in the [place] to [place], [place], [place], or [place]; in the last two weeks or you have been in close contact with a person diagnosed with COVID-19 in the last 2 weeks.   . Tell the health care staff about your symptoms: fever, cough and shortness of breath. . After you have been seen by a medical provider, you will be either: o Tested for (COVID-19) and discharged home on quarantine except to seek medical care if symptoms worsen, and asked to  - Stay home and avoid contact with others until you get your results (4-5 days)  - Avoid travel on public transportation if possible (such as bus, train, or airplane) or o Sent to the Emergency Department by EMS for evaluation, COVID-19 testing, and possible admission depending on your condition and test results.  What to do if you are LOW RISK for COVID-19?  Reduce your risk of any infection by using the same precautions used for avoiding the common cold or flu:  . Wash your hands often with soap and warm water for at least 20 seconds.  If soap and water are not readily available, use an alcohol-based hand sanitizer with at least 60% alcohol.  . If coughing or   sneezing, cover your mouth and nose by coughing or sneezing into the elbow areas of your shirt or coat, into a tissue or into your sleeve (not your hands). . Avoid shaking hands with others and consider head nods or verbal greetings only. . Avoid touching your eyes, nose, or mouth with unwashed hands.  . Avoid close contact with people who are sick. . Avoid places or events with large numbers of people in one location, like concerts or  sporting events. . Carefully consider travel plans you have or are making. . If you are planning any travel outside or inside the [place], visit the CDC's Travelers' Health webpage for the latest health notices. . If you have some symptoms but not all symptoms, continue to monitor at home and seek medical attention if your symptoms worsen. . If you are having a medical emergency, call 911.   ADDITIONAL HEALTHCARE OPTIONS FOR PATIENTS  [facility] / e-Visit: [email or web address]         [facility]: [phone]  [facility]: [phone]                   [facility]: [phone]    

## 2019-02-25 ENCOUNTER — Telehealth: Payer: Self-pay | Admitting: *Deleted

## 2019-02-25 NOTE — Telephone Encounter (Signed)
Patient called to get directions for her CT Scan- she received a message about the time but was unclear about the contrast/prep directions. Advised patient prep directions. Patient mentioned she did not want to have an IV started. Flush appt made for 1215 prior to her scan to have her port accessed. Patient confirms date/time of both appointments.

## 2019-03-12 ENCOUNTER — Other Ambulatory Visit: Payer: Self-pay

## 2019-03-12 ENCOUNTER — Inpatient Hospital Stay: Payer: Medicare Other | Attending: Hematology and Oncology

## 2019-03-12 ENCOUNTER — Encounter (HOSPITAL_COMMUNITY): Payer: Self-pay

## 2019-03-12 ENCOUNTER — Ambulatory Visit (HOSPITAL_COMMUNITY)
Admission: RE | Admit: 2019-03-12 | Discharge: 2019-03-12 | Disposition: A | Discharge status: Home / Self Care | Payer: Medicare Other | Source: Ambulatory Visit | Attending: Hematology and Oncology | Admitting: Hematology and Oncology

## 2019-03-12 DIAGNOSIS — Z923 Personal history of irradiation: Secondary | ICD-10-CM | POA: Insufficient documentation

## 2019-03-12 DIAGNOSIS — Z5112 Encounter for antineoplastic immunotherapy: Secondary | ICD-10-CM | POA: Diagnosis not present

## 2019-03-12 DIAGNOSIS — C787 Secondary malignant neoplasm of liver and intrahepatic bile duct: Secondary | ICD-10-CM | POA: Diagnosis not present

## 2019-03-12 DIAGNOSIS — Z23 Encounter for immunization: Secondary | ICD-10-CM | POA: Diagnosis not present

## 2019-03-12 DIAGNOSIS — D6481 Anemia due to antineoplastic chemotherapy: Secondary | ICD-10-CM | POA: Insufficient documentation

## 2019-03-12 DIAGNOSIS — Z79899 Other long term (current) drug therapy: Secondary | ICD-10-CM | POA: Insufficient documentation

## 2019-03-12 DIAGNOSIS — G4700 Insomnia, unspecified: Secondary | ICD-10-CM | POA: Diagnosis not present

## 2019-03-12 DIAGNOSIS — E039 Hypothyroidism, unspecified: Secondary | ICD-10-CM | POA: Diagnosis not present

## 2019-03-12 DIAGNOSIS — Z9221 Personal history of antineoplastic chemotherapy: Secondary | ICD-10-CM | POA: Diagnosis not present

## 2019-03-12 DIAGNOSIS — C541 Malignant neoplasm of endometrium: Secondary | ICD-10-CM

## 2019-03-12 MED ORDER — SODIUM CHLORIDE (PF) 0.9 % IJ SOLN
INTRAMUSCULAR | Status: AC
  Filled 2019-03-12: qty 50

## 2019-03-12 MED ORDER — HEPARIN SOD (PORK) LOCK FLUSH 100 UNIT/ML IV SOLN
INTRAVENOUS | Status: AC
  Filled 2019-03-12: qty 5

## 2019-03-12 MED ORDER — SODIUM CHLORIDE 0.9% FLUSH
10.0000 mL | Freq: Once | INTRAVENOUS | Status: AC
  Administered 2019-03-12: 10 mL
  Filled 2019-03-12: qty 10

## 2019-03-12 MED ORDER — HEPARIN SOD (PORK) LOCK FLUSH 100 UNIT/ML IV SOLN
500.0000 [IU] | Freq: Once | INTRAVENOUS | Status: AC
  Administered 2019-03-12: 500 [IU] via INTRAVENOUS

## 2019-03-12 MED ORDER — IOHEXOL 300 MG/ML  SOLN
100.0000 mL | Freq: Once | INTRAMUSCULAR | Status: AC | PRN
  Administered 2019-03-12: 100 mL via INTRAVENOUS

## 2019-03-16 ENCOUNTER — Other Ambulatory Visit: Payer: Self-pay

## 2019-03-16 ENCOUNTER — Inpatient Hospital Stay: Payer: Medicare Other

## 2019-03-16 ENCOUNTER — Inpatient Hospital Stay (HOSPITAL_BASED_OUTPATIENT_CLINIC_OR_DEPARTMENT_OTHER): Payer: Medicare Other | Admitting: Hematology and Oncology

## 2019-03-16 DIAGNOSIS — C541 Malignant neoplasm of endometrium: Secondary | ICD-10-CM

## 2019-03-16 DIAGNOSIS — G4701 Insomnia due to medical condition: Secondary | ICD-10-CM

## 2019-03-16 DIAGNOSIS — E039 Hypothyroidism, unspecified: Secondary | ICD-10-CM | POA: Diagnosis not present

## 2019-03-16 DIAGNOSIS — T451X5A Adverse effect of antineoplastic and immunosuppressive drugs, initial encounter: Secondary | ICD-10-CM

## 2019-03-16 DIAGNOSIS — D6481 Anemia due to antineoplastic chemotherapy: Secondary | ICD-10-CM | POA: Diagnosis not present

## 2019-03-16 DIAGNOSIS — Z5112 Encounter for antineoplastic immunotherapy: Secondary | ICD-10-CM | POA: Diagnosis not present

## 2019-03-16 DIAGNOSIS — C787 Secondary malignant neoplasm of liver and intrahepatic bile duct: Secondary | ICD-10-CM

## 2019-03-16 LAB — TSH: TSH: 1.437 u[IU]/mL (ref 0.308–3.960)

## 2019-03-16 LAB — CBC WITH DIFFERENTIAL/PLATELET
Abs Immature Granulocytes: 0.02 10*3/uL (ref 0.00–0.07)
Basophils Absolute: 0 10*3/uL (ref 0.0–0.1)
Basophils Relative: 1 %
Eosinophils Absolute: 0.3 10*3/uL (ref 0.0–0.5)
Eosinophils Relative: 4 %
HCT: 35.2 % — ABNORMAL LOW (ref 36.0–46.0)
Hemoglobin: 11.7 g/dL — ABNORMAL LOW (ref 12.0–15.0)
Immature Granulocytes: 0 %
Lymphocytes Relative: 20 %
Lymphs Abs: 1.3 10*3/uL (ref 0.7–4.0)
MCH: 29.1 pg (ref 26.0–34.0)
MCHC: 33.2 g/dL (ref 30.0–36.0)
MCV: 87.6 fL (ref 80.0–100.0)
Monocytes Absolute: 0.5 10*3/uL (ref 0.1–1.0)
Monocytes Relative: 8 %
Neutro Abs: 4.1 10*3/uL (ref 1.7–7.7)
Neutrophils Relative %: 67 %
Platelets: 196 10*3/uL (ref 150–400)
RBC: 4.02 MIL/uL (ref 3.87–5.11)
RDW: 13.3 % (ref 11.5–15.5)
WBC: 6.1 10*3/uL (ref 4.0–10.5)
nRBC: 0 % (ref 0.0–0.2)

## 2019-03-16 LAB — COMPREHENSIVE METABOLIC PANEL
ALT: 14 U/L (ref 0–44)
AST: 15 U/L (ref 15–41)
Albumin: 3.5 g/dL (ref 3.5–5.0)
Alkaline Phosphatase: 91 U/L (ref 38–126)
Anion gap: 7 (ref 5–15)
BUN: 21 mg/dL (ref 8–23)
CO2: 26 mmol/L (ref 22–32)
Calcium: 9.1 mg/dL (ref 8.9–10.3)
Chloride: 107 mmol/L (ref 98–111)
Creatinine, Ser: 0.83 mg/dL (ref 0.44–1.00)
GFR calc Af Amer: >60 mL/min (ref >60)
GFR calc non Af Amer: >60 mL/min (ref >60)
Glucose, Bld: 97 mg/dL (ref 70–99)
Potassium: 3.7 mmol/L (ref 3.5–5.1)
Sodium: 140 mmol/L (ref 135–145)
Total Bilirubin: 0.6 mg/dL (ref 0.3–1.2)
Total Protein: 6.3 g/dL — ABNORMAL LOW (ref 6.5–8.1)

## 2019-03-16 MED ORDER — HEPARIN SOD (PORK) LOCK FLUSH 100 UNIT/ML IV SOLN
500.0000 [IU] | Freq: Once | INTRAVENOUS | Status: AC | PRN
  Administered 2019-03-16: 500 [IU]
  Filled 2019-03-16: qty 5

## 2019-03-16 MED ORDER — SODIUM CHLORIDE 0.9% FLUSH
10.0000 mL | INTRAVENOUS | Status: DC | PRN
  Administered 2019-03-16: 10 mL
  Filled 2019-03-16: qty 10

## 2019-03-16 MED ORDER — SODIUM CHLORIDE 0.9 % IV SOLN
Freq: Once | INTRAVENOUS | Status: AC
  Administered 2019-03-16: 11:00:00 via INTRAVENOUS
  Filled 2019-03-16: qty 250

## 2019-03-16 MED ORDER — LORAZEPAM 1 MG PO TABS: 1.0000 mg | ORAL_TABLET | Freq: Three times a day (TID) | ORAL | 3 refills | Status: DC | PRN

## 2019-03-16 MED ORDER — SODIUM CHLORIDE 0.9% FLUSH
10.0000 mL | Freq: Once | INTRAVENOUS | Status: AC
  Administered 2019-03-16: 10 mL
  Filled 2019-03-16: qty 10

## 2019-03-16 MED ORDER — SODIUM CHLORIDE 0.9 % IV SOLN
200.0000 mg | Freq: Once | INTRAVENOUS | Status: AC
  Administered 2019-03-16: 200 mg via INTRAVENOUS
  Filled 2019-03-16: qty 8

## 2019-03-16 NOTE — Patient Instructions (Signed)

## 2019-03-16 NOTE — Patient Instructions (Signed)
[facility] Discharge Instructions for Patients Receiving Chemotherapy  Today you received the following chemotherapy agents Pembrolizumab (KEYTRUDA).  To help prevent nausea and vomiting after your treatment, we encourage you to take your nausea medication as prescribed.   If you develop nausea and vomiting that is not controlled by your nausea medication, call the clinic.   BELOW ARE SYMPTOMS THAT SHOULD BE REPORTED IMMEDIATELY:  *FEVER GREATER THAN 100.5 F  *CHILLS WITH OR WITHOUT FEVER  NAUSEA AND VOMITING THAT IS NOT CONTROLLED WITH YOUR NAUSEA MEDICATION  *UNUSUAL SHORTNESS OF BREATH  *UNUSUAL BRUISING OR BLEEDING  TENDERNESS IN MOUTH AND THROAT WITH OR WITHOUT PRESENCE OF ULCERS  *URINARY PROBLEMS  *BOWEL PROBLEMS  UNUSUAL RASH Items with * indicate a potential emergency and should be followed up as soon as possible.  Feel free to call the clinic should you have any questions or concerns. The clinic phone number is [phone].  Please show the CHEMO ALERT CARD at check-in to the Emergency Department and triage nurse.   

## 2019-03-17 ENCOUNTER — Encounter: Payer: Self-pay | Admitting: Hematology and Oncology

## 2019-03-17 NOTE — Progress Notes (Signed)
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[place]) Overall, she is not symptomatic Her last imaging study in September was positive response to therapy Plan to repeat imaging study in March unless she have new symptoms She will continue treatment every 3 weeks   Acquired hypothyroidism Her recent TSH is satisfactory She will continue chronic thyroid replacement therapy We will adjust her thyroid medicine as needed  Anemia due to antineoplastic chemotherapy She has recurrent anemia with history of iron deficiency I plan to recheck iron studies again in her next visit  Insomnia disorder She has chronic insomnia and has taken benzodiazepines in the past to help with her sleep She will continue melatonin and trazodone and occasional benzodiazepine as needed I refill her prescriptions   Orders Placed This Encounter  Procedures  . Ferritin    Standing Status:   Future    Standing Expiration Date:   04/20/2020  . Iron and TIBC    Standing Status:   Future    Standing Expiration Date:   04/20/2020    INTERVAL HISTORY: Please see below for problem oriented charting. She returns for further treatment and follow-up She feels well except for insomnia and restless leg Denies infusion reaction She have rare occasional abdominal pain but denies the need for chronic narcotic prescriptions She denies constipation  SUMMARY OF ONCOLOGIC HISTORY: Oncology History Overview Note  MSI stable on June 2017 tissue but high from [facility] study from November 2017  05/15/16: ER is moderately positive (70%). PR is strongly positive (80%).   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 Lymph node, sentinel, biopsy, right obturator ONE BENIGN LYMPH NODE (0/1) 3. Lymph nodes, regional resection, left pelvic FOUR BENIGN LYMPH NODES (0/4)   01/02/2016 Surgery   Dr. Denman George performed robotic-assisted laparoscopic total hysterectomy with bilateral salpingoophorectomy, sentinel lymph node biopsy, lymphadenectomy     05/15/2016 Pathology Results   Vagina, biopsy, mid - ADENOCARCINOMA, SEE COMMENT. Microscopic Comment The morphology along with the patient's history are consistent with recurrent endometrioid adenocarcinoma. The carcinoma has a similar appearance to the primary ([id number]).   05/20/2016 Imaging   Ct scan abdomen showed solid 2.5 cm peritoneal mass in the mid to anterior left pelvis, suspicious for peritoneal metastasis. 2. Small expansile low-attenuation filling defect in the left external iliac vein, cannot exclude a small deep venous thrombus. Consider correlation with left lower extremity venous Doppler scan. 3. Small simple fluid density structure in the left pelvic sidewall abutting the left external iliac vessels, favor a small postoperative seroma. 4. No ascites. 5. No lymphadenopathy.  No metastatic disease in the chest. 6. Aortic atherosclerosis.   06/12/2016 PET scan   Intensely hypermetabolic 2.1 cm central pelvic peritoneal mass just to the left of midline, consistent with peritoneal metastatic recurrence. No ascites. 2. No additional hypermetabolic sites of metastatic disease. 3. Diffuse thyroid hypermetabolism without discrete thyroid nodule, favoring thyroiditis. Recommend correlation with serum thyroid function tests.   06/24/2016 - 07/22/2016 Chemotherapy   The patient had weekly cisplatin. She has missed several doses due to infection and pancytopenia   06/24/2016 - 09/04/2016 Radiation  Therapy   She completed concurrent radiation therapy Radiation treatment dates:   IMRT : 06/24/16 - 08/01/16 HDR : 08/13/16, 08/20/16, 08/27/16, 09/04/16  Site/dose:   Pelvis treated to 55 Gy in 25 fractions (simultaneous integrated boost technique) Vaginal Cuff treated to 24 Gy in 4 fractions   08/15/2016 - 12/03/2016 Chemotherapy   She received 6 cycles of carboplatin/Taxol   09/27/2016 Imaging   Interval decrease in size of previously described solid peritoneal nodule within the left anterior pelvis. Near complete resolution of previously described low-attenuation structure along the left pelvic sidewall. No evidence for metastatic disease in the chest. Aortic atherosclerosis.   12/30/2016 Imaging   Ct abdomen 1. Solitary left pelvic peritoneal implant is mildly decreased in size in the interval. 2. No new or progressive metastatic disease in the abdomen or pelvis. No ascites. 3. Aortic atherosclerosis.   04/02/2017 Imaging   Left lower quadrant peritoneal implant referenced on previous exam measures 1.7 x 1.3 cm, image 69 of series 2. Increased from 0.8 x 0.8 cm previously peer no new peritoneal implants identified.  Musculoskeletal: The degenerative disc disease noted within the lumbar spine.  IMPRESSION: 1. Solitary left pelvic peritoneal implant is increased in size in the interval. 2. No new sites of disease.  No ascites. 3. Aortic atherosclerosis   04/11/2017 PET scan   1. The left side of pelvis peritoneal implant has decreased in size and degree of FDG uptake compatible with response to therapy. No new areas of peritoneal disease identified. 2. Persistent diffuse increased uptake within the thyroid gland. Correlation with patient's thyroid function may be helpful.   05/26/2017 Imaging   1. Enlarging tumor implant along the left adnexa, currently 2.6 by 2.4 cm and previously 1.9 by 1.3 cm. No new tumor implant or other specific cause for the patient's pelvic symptoms is  currently identified. 2.  Aortic Atherosclerosis (ICD10-I70.0). 3. Lumbar spondylosis and degenerative disc disease causing multilevel impingement.   06/04/2017 - 08/28/2017 Chemotherapy   The patient started letrozole and everolimus   08/26/2017 Imaging   Increased size of mass in the left adnexal region.  Several new small liver metastases in right hepatic lobe   09/08/2017 Imaging   LV EF: 60% -  65%   09/10/2017 Procedure   Technically successful right IJ power-injectable port catheter placement. Ready for routine use   12/04/2017 Imaging   1. Interval increase in size and number of multiple lesions within the liver compatible with hepatic metastatic disease. 2. Interval increase in size mass within the left hemipelvis.   12/15/2017 -  Chemotherapy   The patient had pembrolizumab (KEYTRUDA) 200 mg in sodium chloride 0.9 % 50 mL chemo infusion, 200 mg, Intravenous, Once, 1 of 6 cycles Administration: 200 mg (12/15/2017)  for chemotherapy treatment.    12/21/2017 Imaging   1. Moderate left hydronephrosis and hydroureter, similar compared to most recent CT from May 2019; obstruction appears to be secondary to a left pelvic mass/metastatic focus which has increased in size since the prior CT. 2. Numerous hepatic metastatic lesions, suspect that some may be increased in size but difficult to further characterize without intravenous contrast. Increased size of left pelvic soft tissue mass/metastatic lesion. Mass abuts and possibly invades the sigmoid colon.    02/20/2018 Imaging   Interval decrease in hepatic metastases.  Decreased mass or lymphadenopathy in the left external iliac chain. Interval resolution of left hydroureteronephrosis.  No new or progressive disease within the abdomen or pelvis.   05/16/2018 Imaging  05/16/2018 CT Abdomen IMPRESSION: 1. Slight interval decrease in size of hepatic metastatic disease. 2. Slight interval decrease in size centrally necrotic mass  within the left external iliac region.   09/18/2018 Imaging   1. Today's study demonstrates a mixed response to therapy. Specifically, while the previously noted hepatic metastases appear decreased, the soft tissue mass along the left pelvic sidewall appears slightly larger than the prior study. No new metastatic disease is noted elsewhere in the abdomen or pelvis. 2. Aortic atherosclerosis. 3. Mild cardiomegaly. 4. Additional incidental findings, as above    03/12/2019 Imaging   1. Overall improvement with reduced size of the hepatic metastatic lesions, and reduced size of the irregular soft tissue mass in the left pelvis. 2. Other imaging findings of potential clinical significance: Mild distal esophageal wall thickening, cannot exclude esophagitis. Aortic Atherosclerosis (ICD10-I70.0). Lumbar impingement at L5-S1.       REVIEW OF SYSTEMS:   Constitutional: Denies fevers, chills or abnormal weight loss Eyes: Denies blurriness of vision Ears, nose, mouth, throat, and face: Denies mucositis or sore throat Respiratory: Denies cough, dyspnea or wheezes Cardiovascular: Denies palpitation, chest discomfort or lower extremity swelling Gastrointestinal:  Denies nausea, heartburn or change in bowel habits Skin: Denies abnormal skin rashes Lymphatics: Denies new lymphadenopathy or easy bruising Neurological:Denies numbness, tingling or new weaknesses Behavioral/Psych: Mood is stable, no new changes  All other systems were reviewed with the patient and are negative.  I have reviewed the past medical history, past surgical history, social history and family history with the patient and they are unchanged from previous note.  ALLERGIES:  is allergic to latex.  MEDICATIONS:  Current Outpatient Medications  Medication Sig Dispense Refill  . pramipexole (MIRAPEX) 0.5 MG tablet Take 1 mg by mouth at bedtime.    Marland Kitchen acetaminophen (TYLENOL) 325 MG tablet Take 650 mg by mouth every 6 (six) hours as  needed for mild pain or moderate pain.     Marland Kitchen amoxicillin (AMOXIL) 500 MG capsule Take 2,000 mg by mouth See admin instructions. Prior to dental appointment, takes 4 tables prior to appt    . Biotin w/ Vitamins C & E (HAIR/SKIN/NAILS PO) Take 1 tablet by mouth daily.    . Calcium Carb-Cholecalciferol (CALCIUM 600 + D PO) Take 2 tablets by mouth daily.    . fluticasone (FLONASE) 50 MCG/ACT nasal spray Place 1 spray into both nostrils 2 (two) times daily as needed for allergies.   1  . gabapentin (NEURONTIN) 600 MG tablet TAKE 1 TABLET BY MOUTH TWICE A DAY (Patient taking differently: Take 1 tablet 2 times a day) 60 tablet 9  . gabapentin (NEURONTIN) 600 MG tablet TAKE 1 TABLET BY MOUTH TWICE A DAY FOR RESTLESS LEGS AND NEUROPATHY.    Marland Kitchen ibuprofen (ADVIL,MOTRIN) 200 MG tablet Take 400 mg by mouth every 8 (eight) hours as needed for mild pain.    Marland Kitchen levothyroxine (SYNTHROID) 200 MCG tablet Take 1 tablet (200 mcg total) by mouth daily before breakfast. 90 tablet 0  . lidocaine-prilocaine (EMLA) cream APPLY TO AFFECTED AREA ONCE 30 g 3  . loratadine (CLARITIN) 10 MG tablet Take 10 mg by mouth daily.    Marland Kitchen LORazepam (ATIVAN) 1 MG tablet Take 1 tablet (1 mg total) by mouth every 8 (eight) hours as needed for anxiety or sleep. 90 tablet 3  . losartan-hydrochlorothiazide (HYZAAR) 100-12.5 MG tablet Take 0.5 tablets by mouth daily. 60 tablet 9  . Magnesium 250 MG TABS Take 250 mg by mouth daily.    Marland Kitchen  magnesium oxide (MAG-OX) 400 (241.3 Mg) MG tablet Take 1 tablet (400 mg total) by mouth 2 (two) times daily. 60 tablet 9  . magnesium oxide (MAG-OX) 400 MG tablet Take 1 tablet by mouth 2 (two) times daily.    . Melatonin 10 MG CAPS Take 10 mg by mouth at bedtime.    . niacinamide 500 MG tablet Take 500 mg by mouth 3 (three) times daily with meals.    . ondansetron (ZOFRAN) 8 MG tablet Take 1 tablet (8 mg total) by mouth every 8 (eight) hours as needed for nausea. 60 tablet 1  . potassium gluconate 595 (99 K) MG  TABS tablet Take by mouth.    . traZODone (DESYREL) 50 MG tablet Take 1.5 tablets (75 mg total) by mouth at bedtime. (Patient taking differently: Take 50 mg by mouth at bedtime. ) 135 tablet 1   No current facility-administered medications for this visit.     PHYSICAL EXAMINATION: ECOG PERFORMANCE STATUS: 1 - Symptomatic but completely ambulatory  Vitals:   03/16/19 1005  BP: (!) 152/74  Pulse: 71  Resp: 20  Temp: 99.1 F (37.3 C)  SpO2: 100%   Filed Weights   03/16/19 1005  Weight: 209 lb 6.4 oz (95 kg)    GENERAL:alert, no distress and comfortable SKIN: skin color, texture, turgor are normal, no rashes or significant lesions EYES: normal, Conjunctiva are pink and non-injected, sclera clear OROPHARYNX:no exudate, no erythema and lips, buccal mucosa, and tongue normal  NECK: supple, thyroid normal size, non-tender, without nodularity LYMPH:  no palpable lymphadenopathy in the cervical, axillary or inguinal LUNGS: clear to auscultation and percussion with normal breathing effort HEART: regular rate & rhythm and no murmurs and no lower extremity edema ABDOMEN:abdomen soft, non-tender and normal bowel sounds Musculoskeletal:no cyanosis of digits and no clubbing  NEURO: alert & oriented x 3 with fluent speech, no focal motor/sensory deficits  LABORATORY DATA:  I have reviewed the data as listed    Component Value Date/Time   NA 140 03/16/2019 0930   NA 141 07/09/2017 1312   K 3.7 03/16/2019 0930   K 3.9 07/09/2017 1312   CL 107 03/16/2019 0930   CO2 26 03/16/2019 0930   CO2 27 07/09/2017 1312   GLUCOSE 97 03/16/2019 0930   GLUCOSE 109 07/09/2017 1312   BUN 21 03/16/2019 0930   BUN 16.4 07/09/2017 1312   CREATININE 0.83 03/16/2019 0930   CREATININE 0.91 01/04/2019 1351   CREATININE 0.8 07/09/2017 1312   CALCIUM 9.1 03/16/2019 0930   CALCIUM 9.2 07/09/2017 1312   PROT 6.3 (L) 03/16/2019 0930   PROT 7.1 07/09/2017 1312   ALBUMIN 3.5 03/16/2019 0930   ALBUMIN 3.1 (L)  07/09/2017 1312   AST 15 03/16/2019 0930   AST 19 01/04/2019 1351   AST 35 (H) 07/09/2017 1312   ALT 14 03/16/2019 0930   ALT 20 01/04/2019 1351   ALT 43 07/09/2017 1312   ALKPHOS 91 03/16/2019 0930   ALKPHOS 182 (H) 07/09/2017 1312   BILITOT 0.6 03/16/2019 0930   BILITOT 0.3 01/04/2019 1351   BILITOT 0.34 07/09/2017 1312   GFRNONAA >60 03/16/2019 0930   GFRNONAA >60 01/04/2019 1351   GFRAA >60 03/16/2019 0930   GFRAA >60 01/04/2019 1351    No results found for: SPEP, UPEP  Lab Results  Component Value Date   WBC 6.1 03/16/2019   NEUTROABS 4.1 03/16/2019   HGB 11.7 (L) 03/16/2019   HCT 35.2 (L) 03/16/2019   MCV 87.6 03/16/2019  PLT 196 03/16/2019      Chemistry      Component Value Date/Time   NA 140 03/16/2019 0930   NA 141 07/09/2017 1312   K 3.7 03/16/2019 0930   K 3.9 07/09/2017 1312   CL 107 03/16/2019 0930   CO2 26 03/16/2019 0930   CO2 27 07/09/2017 1312   BUN 21 03/16/2019 0930   BUN 16.4 07/09/2017 1312   CREATININE 0.83 03/16/2019 0930   CREATININE 0.91 01/04/2019 1351   CREATININE 0.8 07/09/2017 1312      Component Value Date/Time   CALCIUM 9.1 03/16/2019 0930   CALCIUM 9.2 07/09/2017 1312   ALKPHOS 91 03/16/2019 0930   ALKPHOS 182 (H) 07/09/2017 1312   AST 15 03/16/2019 0930   AST 19 01/04/2019 1351   AST 35 (H) 07/09/2017 1312   ALT 14 03/16/2019 0930   ALT 20 01/04/2019 1351   ALT 43 07/09/2017 1312   BILITOT 0.6 03/16/2019 0930   BILITOT 0.3 01/04/2019 1351   BILITOT 0.34 07/09/2017 1312       RADIOGRAPHIC STUDIES: I have reviewed multiple imaging studies with the patient I have personally reviewed the radiological images as listed and agreed with the findings in the report. Ct Abdomen Pelvis W Contrast  Result Date: 03/12/2019 CLINICAL DATA:  Restaging of metastatic endometrial cancer. Ongoing immunotherapy. EXAM: CT ABDOMEN AND PELVIS WITH CONTRAST TECHNIQUE: Multidetector CT imaging of the abdomen and pelvis was performed using the  standard protocol following bolus administration of intravenous contrast. CONTRAST:  167m OMNIPAQUE IOHEXOL 300 MG/ML  SOLN COMPARISON:  09/18/2018 FINDINGS: Lower chest: Stable minimal subpleural nodularity in the right lower lobe. Stable lingular scarring. Mild calcification along the mitral valve. Mild distal esophageal wall thickening of the gastroesophageal junction. Hepatobiliary: In the dome of the right hepatic lobe a 0.9 by 0.7 cm lesion with peripheral hypodensity and some central intermediate to accentuated density is present on image 9/2, this was previously diffusely hypodense and previously measured 1.1 by 0.9 cm. 1.0 by 0.8 cm hypodense lesion in the right hepatic lobe on image 15/2, formerly volume averaged between 2 slices but measuring approximately 0.9 by 0.8 cm. This lesion is felt overall be stable. A right lower lobe subcapsular hypodense lesion measures 0.8 by 0.6 cm on image 27/2, previously 1.6 by 0.8 cm. An indistinct hypodensity in the right hepatic lobe on image 26/2 measures 0.8 by 0.6 cm, formerly approximately 1.0 by 0.8 cm. Contracted gallbladder. Small hypodensity adjacent to the gallbladder fossa in the right hepatic lobe measures 0.9 by 0.7 cm on image 28/2, formerly 1.2 by 0.9 cm. Pancreas: Unremarkable Spleen: Unremarkable Adrenals/Urinary Tract: Unremarkable Stomach/Bowel: Small periampullary duodenal diverticulum. The irregular left pelvic soft tissue density abuts the sigmoid colon, as before. Vascular/Lymphatic: Aortoiliac atherosclerotic vascular disease. Reproductive: Uterus absent. Right adnexa unremarkable. Left adnexa at least partially obscured by the irregular soft tissue density in the left pelvis. Other: As before, in the left pelvis superficial to the left iliac vessels there is a somewhat irregular soft tissue density lesion with irregular margins. When measured in the same fashion as before on image 68/2, this lesion measures 4.6 by 2.9 by 3.1 cm, formerly 5.2 by  4.0 by 3.5 cm. Musculoskeletal: Lumbar spondylosis and degenerative disc disease with bilateral foraminal impingement at the L5-S1 level. Small umbilical hernia contains adipose tissue. IMPRESSION: 1. Overall improvement with reduced size of the hepatic metastatic lesions, and reduced size of the irregular soft tissue mass in the left pelvis. 2. Other imaging  findings of potential clinical significance: Mild distal esophageal wall thickening, cannot exclude esophagitis. Aortic Atherosclerosis (ICD10-I70.0). Lumbar impingement at L5-S1. Electronically Signed   By: Van Clines M.D.   On: 03/12/2019 14:37    All questions were answered. The patient knows to call the clinic with any problems, questions or concerns. No barriers to learning was detected.  I spent 15 minutes counseling the patient face to face. The total time spent in the appointment was 20 minutes and more than 50% was on counseling and review of test results  Heath Lark, MD 03/17/2019 12:29 PM

## 2019-03-17 NOTE — Assessment & Plan Note (Signed)
Overall, she is not symptomatic Her last imaging study in September was positive response to therapy Plan to repeat imaging study in March unless she have new symptoms She will continue treatment every 3 weeks

## 2019-03-17 NOTE — Assessment & Plan Note (Signed)
She has recurrent anemia with history of iron deficiency I plan to recheck iron studies again in her next visit 

## 2019-03-17 NOTE — Assessment & Plan Note (Signed)
She has chronic insomnia and has taken benzodiazepines in the past to help with her sleep She will continue melatonin and trazodone and occasional benzodiazepine as needed I refill her prescriptions

## 2019-03-17 NOTE — Assessment & Plan Note (Signed)
Her recent TSH is satisfactory She will continue chronic thyroid replacement therapy We will adjust her thyroid medicine as needed 

## 2019-03-18 ENCOUNTER — Telehealth: Payer: Self-pay | Admitting: Hematology and Oncology

## 2019-03-18 NOTE — Telephone Encounter (Signed)
I talk with patient regarding schedule  

## 2019-04-06 ENCOUNTER — Inpatient Hospital Stay: Payer: Medicare Other

## 2019-04-06 ENCOUNTER — Other Ambulatory Visit: Payer: Self-pay

## 2019-04-06 VITALS — BP 127/72 | HR 69 | Temp 98.0°F | Resp 18

## 2019-04-06 DIAGNOSIS — C541 Malignant neoplasm of endometrium: Secondary | ICD-10-CM

## 2019-04-06 DIAGNOSIS — E039 Hypothyroidism, unspecified: Secondary | ICD-10-CM

## 2019-04-06 DIAGNOSIS — T451X5A Adverse effect of antineoplastic and immunosuppressive drugs, initial encounter: Secondary | ICD-10-CM

## 2019-04-06 DIAGNOSIS — Z5112 Encounter for antineoplastic immunotherapy: Secondary | ICD-10-CM | POA: Diagnosis not present

## 2019-04-06 DIAGNOSIS — D6481 Anemia due to antineoplastic chemotherapy: Secondary | ICD-10-CM

## 2019-04-06 DIAGNOSIS — C787 Secondary malignant neoplasm of liver and intrahepatic bile duct: Secondary | ICD-10-CM

## 2019-04-06 DIAGNOSIS — Z23 Encounter for immunization: Secondary | ICD-10-CM

## 2019-04-06 LAB — CBC WITH DIFFERENTIAL/PLATELET
Abs Immature Granulocytes: 0.02 10*3/uL (ref 0.00–0.07)
Basophils Absolute: 0 10*3/uL (ref 0.0–0.1)
Basophils Relative: 1 %
Eosinophils Absolute: 0.3 10*3/uL (ref 0.0–0.5)
Eosinophils Relative: 4 %
HCT: 35 % — ABNORMAL LOW (ref 36.0–46.0)
Hemoglobin: 11.7 g/dL — ABNORMAL LOW (ref 12.0–15.0)
Immature Granulocytes: 0 %
Lymphocytes Relative: 21 %
Lymphs Abs: 1.4 10*3/uL (ref 0.7–4.0)
MCH: 28.8 pg (ref 26.0–34.0)
MCHC: 33.4 g/dL (ref 30.0–36.0)
MCV: 86.2 fL (ref 80.0–100.0)
Monocytes Absolute: 0.5 10*3/uL (ref 0.1–1.0)
Monocytes Relative: 8 %
Neutro Abs: 4.4 10*3/uL (ref 1.7–7.7)
Neutrophils Relative %: 66 %
Platelets: 186 10*3/uL (ref 150–400)
RBC: 4.06 MIL/uL (ref 3.87–5.11)
RDW: 13.2 % (ref 11.5–15.5)
WBC: 6.6 10*3/uL (ref 4.0–10.5)
nRBC: 0 % (ref 0.0–0.2)

## 2019-04-06 LAB — COMPREHENSIVE METABOLIC PANEL
ALT: 15 U/L (ref 0–44)
AST: 17 U/L (ref 15–41)
Albumin: 3.6 g/dL (ref 3.5–5.0)
Alkaline Phosphatase: 102 U/L (ref 38–126)
Anion gap: 9 (ref 5–15)
BUN: 21 mg/dL (ref 8–23)
CO2: 26 mmol/L (ref 22–32)
Calcium: 9.2 mg/dL (ref 8.9–10.3)
Chloride: 106 mmol/L (ref 98–111)
Creatinine, Ser: 0.81 mg/dL (ref 0.44–1.00)
GFR calc Af Amer: >60 mL/min (ref >60)
GFR calc non Af Amer: >60 mL/min (ref >60)
Glucose, Bld: 99 mg/dL (ref 70–99)
Potassium: 3.7 mmol/L (ref 3.5–5.1)
Sodium: 141 mmol/L (ref 135–145)
Total Bilirubin: 0.5 mg/dL (ref 0.3–1.2)
Total Protein: 6.5 g/dL (ref 6.5–8.1)

## 2019-04-06 LAB — IRON AND TIBC
Iron: 71 ug/dL (ref 41–142)
Saturation Ratios: 29 % (ref 21–57)
TIBC: 250 ug/dL (ref 236–444)
UIBC: 179 ug/dL (ref 120–384)

## 2019-04-06 LAB — TSH: TSH: 1.197 u[IU]/mL (ref 0.308–3.960)

## 2019-04-06 LAB — FERRITIN: Ferritin: 152 ng/mL (ref 11–307)

## 2019-04-06 MED ORDER — HEPARIN SOD (PORK) LOCK FLUSH 100 UNIT/ML IV SOLN
500.0000 [IU] | Freq: Once | INTRAVENOUS | Status: AC | PRN
  Administered 2019-04-06: 500 [IU]
  Filled 2019-04-06: qty 5

## 2019-04-06 MED ORDER — INFLUENZA VAC A&B SA ADJ QUAD 0.5 ML IM PRSY
0.5000 mL | PREFILLED_SYRINGE | Freq: Once | INTRAMUSCULAR | Status: AC
  Administered 2019-04-06: 0.5 mL via INTRAMUSCULAR

## 2019-04-06 MED ORDER — SODIUM CHLORIDE 0.9% FLUSH
10.0000 mL | INTRAVENOUS | Status: DC | PRN
  Administered 2019-04-06: 15:00:00 10 mL
  Filled 2019-04-06: qty 10

## 2019-04-06 MED ORDER — INFLUENZA VAC A&B SA ADJ QUAD 0.5 ML IM PRSY
PREFILLED_SYRINGE | INTRAMUSCULAR | Status: AC
  Filled 2019-04-06: qty 0.5

## 2019-04-06 MED ORDER — SODIUM CHLORIDE 0.9 % IV SOLN
Freq: Once | INTRAVENOUS | Status: AC
  Administered 2019-04-06: 14:00:00 via INTRAVENOUS
  Filled 2019-04-06: qty 250

## 2019-04-06 MED ORDER — SODIUM CHLORIDE 0.9% FLUSH
10.0000 mL | Freq: Once | INTRAVENOUS | Status: AC
  Administered 2019-04-06: 13:00:00 10 mL
  Filled 2019-04-06: qty 10

## 2019-04-06 MED ORDER — SODIUM CHLORIDE 0.9 % IV SOLN
200.0000 mg | Freq: Once | INTRAVENOUS | Status: AC
  Administered 2019-04-06: 14:00:00 200 mg via INTRAVENOUS
  Filled 2019-04-06: qty 8

## 2019-04-06 NOTE — Patient Instructions (Signed)
[facility] Discharge Instructions for Patients Receiving Chemotherapy  Today you received the following chemotherapy agents Pembrolizumab (KEYTRUDA).  To help prevent nausea and vomiting after your treatment, we encourage you to take your nausea medication as prescribed.   If you develop nausea and vomiting that is not controlled by your nausea medication, call the clinic.   BELOW ARE SYMPTOMS THAT SHOULD BE REPORTED IMMEDIATELY:  *FEVER GREATER THAN 100.5 F  *CHILLS WITH OR WITHOUT FEVER  NAUSEA AND VOMITING THAT IS NOT CONTROLLED WITH YOUR NAUSEA MEDICATION  *UNUSUAL SHORTNESS OF BREATH  *UNUSUAL BRUISING OR BLEEDING  TENDERNESS IN MOUTH AND THROAT WITH OR WITHOUT PRESENCE OF ULCERS  *URINARY PROBLEMS  *BOWEL PROBLEMS  UNUSUAL RASH Items with * indicate a potential emergency and should be followed up as soon as possible.  Feel free to call the clinic should you have any questions or concerns. The clinic phone number is [phone].  Please show the CHEMO ALERT CARD at check-in to the Emergency Department and triage nurse.   

## 2019-04-23 ENCOUNTER — Other Ambulatory Visit: Payer: Self-pay | Admitting: Hematology and Oncology

## 2019-04-27 ENCOUNTER — Inpatient Hospital Stay: Payer: Medicare Other | Attending: Hematology and Oncology

## 2019-04-27 ENCOUNTER — Other Ambulatory Visit: Payer: Self-pay | Admitting: Hematology and Oncology

## 2019-04-27 ENCOUNTER — Inpatient Hospital Stay (HOSPITAL_BASED_OUTPATIENT_CLINIC_OR_DEPARTMENT_OTHER): Payer: Medicare Other | Admitting: Hematology and Oncology

## 2019-04-27 ENCOUNTER — Other Ambulatory Visit: Payer: Self-pay

## 2019-04-27 ENCOUNTER — Inpatient Hospital Stay: Payer: Medicare Other

## 2019-04-27 ENCOUNTER — Encounter: Payer: Self-pay | Admitting: Hematology and Oncology

## 2019-04-27 DIAGNOSIS — Z791 Long term (current) use of non-steroidal anti-inflammatories (NSAID): Secondary | ICD-10-CM | POA: Diagnosis not present

## 2019-04-27 DIAGNOSIS — D509 Iron deficiency anemia, unspecified: Secondary | ICD-10-CM

## 2019-04-27 DIAGNOSIS — C541 Malignant neoplasm of endometrium: Secondary | ICD-10-CM | POA: Diagnosis present

## 2019-04-27 DIAGNOSIS — Z9221 Personal history of antineoplastic chemotherapy: Secondary | ICD-10-CM | POA: Insufficient documentation

## 2019-04-27 DIAGNOSIS — E039 Hypothyroidism, unspecified: Secondary | ICD-10-CM

## 2019-04-27 DIAGNOSIS — C787 Secondary malignant neoplasm of liver and intrahepatic bile duct: Secondary | ICD-10-CM | POA: Diagnosis not present

## 2019-04-27 DIAGNOSIS — Z923 Personal history of irradiation: Secondary | ICD-10-CM | POA: Insufficient documentation

## 2019-04-27 DIAGNOSIS — Z5112 Encounter for antineoplastic immunotherapy: Secondary | ICD-10-CM | POA: Insufficient documentation

## 2019-04-27 DIAGNOSIS — Z79899 Other long term (current) drug therapy: Secondary | ICD-10-CM | POA: Insufficient documentation

## 2019-04-27 LAB — COMPREHENSIVE METABOLIC PANEL
ALT: 13 U/L (ref 0–44)
AST: 17 U/L (ref 15–41)
Albumin: 3.6 g/dL (ref 3.5–5.0)
Alkaline Phosphatase: 107 U/L (ref 38–126)
Anion gap: 11 (ref 5–15)
BUN: 19 mg/dL (ref 8–23)
CO2: 25 mmol/L (ref 22–32)
Calcium: 9.7 mg/dL (ref 8.9–10.3)
Chloride: 101 mmol/L (ref 98–111)
Creatinine, Ser: 0.83 mg/dL (ref 0.44–1.00)
GFR calc Af Amer: >60 mL/min (ref >60)
GFR calc non Af Amer: >60 mL/min (ref >60)
Glucose, Bld: 105 mg/dL — ABNORMAL HIGH (ref 70–99)
Potassium: 3.7 mmol/L (ref 3.5–5.1)
Sodium: 137 mmol/L (ref 135–145)
Total Bilirubin: 0.5 mg/dL (ref 0.3–1.2)
Total Protein: 6.9 g/dL (ref 6.5–8.1)

## 2019-04-27 LAB — CBC WITH DIFFERENTIAL/PLATELET
Abs Immature Granulocytes: 0.02 10*3/uL (ref 0.00–0.07)
Basophils Absolute: 0.1 10*3/uL (ref 0.0–0.1)
Basophils Relative: 1 %
Eosinophils Absolute: 0.2 10*3/uL (ref 0.0–0.5)
Eosinophils Relative: 3 %
HCT: 36 % (ref 36.0–46.0)
Hemoglobin: 12.1 g/dL (ref 12.0–15.0)
Immature Granulocytes: 0 %
Lymphocytes Relative: 19 %
Lymphs Abs: 1.5 10*3/uL (ref 0.7–4.0)
MCH: 28.9 pg (ref 26.0–34.0)
MCHC: 33.6 g/dL (ref 30.0–36.0)
MCV: 85.9 fL (ref 80.0–100.0)
Monocytes Absolute: 0.5 10*3/uL (ref 0.1–1.0)
Monocytes Relative: 6 %
Neutro Abs: 5.5 10*3/uL (ref 1.7–7.7)
Neutrophils Relative %: 71 %
Platelets: 204 10*3/uL (ref 150–400)
RBC: 4.19 MIL/uL (ref 3.87–5.11)
RDW: 12.7 % (ref 11.5–15.5)
WBC: 7.8 10*3/uL (ref 4.0–10.5)
nRBC: 0 % (ref 0.0–0.2)

## 2019-04-27 LAB — TSH: TSH: 1.986 u[IU]/mL (ref 0.308–3.960)

## 2019-04-27 MED ORDER — SODIUM CHLORIDE 0.9% FLUSH
10.0000 mL | INTRAVENOUS | Status: DC | PRN
  Administered 2019-04-27: 10 mL
  Filled 2019-04-27: qty 10

## 2019-04-27 MED ORDER — LEVOTHYROXINE SODIUM 200 MCG PO TABS: 200.0000 ug | ORAL_TABLET | Freq: Every day | ORAL | 11 refills | Status: DC

## 2019-04-27 MED ORDER — SODIUM CHLORIDE 0.9 % IV SOLN
200.0000 mg | Freq: Once | INTRAVENOUS | Status: AC
  Administered 2019-04-27: 200 mg via INTRAVENOUS
  Filled 2019-04-27: qty 8

## 2019-04-27 MED ORDER — HEPARIN SOD (PORK) LOCK FLUSH 100 UNIT/ML IV SOLN
500.0000 [IU] | Freq: Once | INTRAVENOUS | Status: AC | PRN
  Administered 2019-04-27: 500 [IU]
  Filled 2019-04-27: qty 5

## 2019-04-27 MED ORDER — SODIUM CHLORIDE 0.9 % IV SOLN
Freq: Once | INTRAVENOUS | Status: AC
  Administered 2019-04-27: 14:00:00 via INTRAVENOUS
  Filled 2019-04-27: qty 250

## 2019-04-27 NOTE — Assessment & Plan Note (Signed)
She is currently not anemic Her last iron studies are within normal limits

## 2019-04-27 NOTE — Assessment & Plan Note (Signed)
Overall, she is not symptomatic Her last imaging study in September was positive response to therapy Plan to repeat imaging study in March 2021 unless she have new symptoms She will continue treatment every 3 weeks

## 2019-04-27 NOTE — Patient Instructions (Signed)
[facility] Discharge Instructions for Patients Receiving Chemotherapy  Today you received the following chemotherapy agents:  Keytruda.  To help prevent nausea and vomiting after your treatment, we encourage you to take your nausea medication as directed.   If you develop nausea and vomiting that is not controlled by your nausea medication, call the clinic.   BELOW ARE SYMPTOMS THAT SHOULD BE REPORTED IMMEDIATELY:  *FEVER GREATER THAN 100.5 F  *CHILLS WITH OR WITHOUT FEVER  NAUSEA AND VOMITING THAT IS NOT CONTROLLED WITH YOUR NAUSEA MEDICATION  *UNUSUAL SHORTNESS OF BREATH  *UNUSUAL BRUISING OR BLEEDING  TENDERNESS IN MOUTH AND THROAT WITH OR WITHOUT PRESENCE OF ULCERS  *URINARY PROBLEMS  *BOWEL PROBLEMS  UNUSUAL RASH Items with * indicate a potential emergency and should be followed up as soon as possible.  Feel free to call the clinic should you have any questions or concerns. The clinic phone number is [phone].  Please show the CHEMO ALERT CARD at check-in to the Emergency Department and triage nurse.    

## 2019-04-27 NOTE — Progress Notes (Signed)
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   INTERVAL HISTORY: Please see below for problem oriented charting. She returns for further follow-up She is doing very well with treatment No infusion reaction Her energy level is fair Denies recent changes in bowel habits She continues to have nonspecific left groin discomfort that comes and goes but not severe enough to warrant pain medicine  SUMMARY OF ONCOLOGIC HISTORY: Oncology History Overview Note  MSI stable on June 2017 tissue but high from [facility] study from November 2017  05/15/16: ER is moderately positive (70%). PR is strongly positive (80%).   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 Lymph node, sentinel, biopsy, right obturator ONE BENIGN LYMPH NODE (0/1) 3. Lymph nodes, regional resection, left pelvic FOUR BENIGN LYMPH NODES (0/4)   01/02/2016 Surgery   Dr. Denman George performed robotic-assisted laparoscopic  total hysterectomy with bilateral salpingoophorectomy, sentinel lymph node biopsy, lymphadenectomy     05/15/2016 Pathology Results   Vagina, biopsy, mid - ADENOCARCINOMA, SEE COMMENT. Microscopic Comment The morphology along with the patient's history are consistent with recurrent endometrioid adenocarcinoma. The carcinoma has a similar appearance to the primary ([id number]).   05/20/2016 Imaging   Ct scan abdomen showed solid 2.5 cm peritoneal mass in the mid to anterior left pelvis, suspicious for peritoneal metastasis. 2. Small expansile low-attenuation filling defect in the left external iliac vein, cannot exclude a small deep venous thrombus. Consider correlation with left lower extremity venous Doppler scan. 3. Small simple fluid density structure in the left pelvic sidewall abutting the left external iliac vessels, favor a small postoperative seroma. 4. No ascites. 5. No lymphadenopathy.  No metastatic disease in the chest. 6. Aortic atherosclerosis.   06/12/2016 PET scan   Intensely hypermetabolic 2.1 cm central pelvic peritoneal mass just to the left of midline, consistent with peritoneal metastatic recurrence. No ascites. 2. No additional hypermetabolic sites of metastatic disease. 3. Diffuse thyroid hypermetabolism without discrete thyroid nodule, favoring thyroiditis. Recommend correlation with serum thyroid function tests.   06/24/2016 - 07/22/2016 Chemotherapy   The patient had weekly cisplatin. She has missed several doses due to infection and pancytopenia   06/24/2016 - 09/04/2016 Radiation Therapy   She completed concurrent radiation therapy Radiation treatment dates:   IMRT : 06/24/16 - 08/01/16 HDR : 08/13/16, 08/20/16, 08/27/16, 09/04/16  Site/dose:   Pelvis treated to 55 Gy in 25 fractions (simultaneous integrated boost technique) Vaginal Cuff treated to 24 Gy in 4 fractions   08/15/2016 - 12/03/2016 Chemotherapy   She received 6 cycles of carboplatin/Taxol   09/27/2016  Imaging   Interval  decrease in size of previously described solid peritoneal nodule within the left anterior pelvis. Near complete resolution of previously described low-attenuation structure along the left pelvic sidewall. No evidence for metastatic disease in the chest. Aortic atherosclerosis.   12/30/2016 Imaging   Ct abdomen 1. Solitary left pelvic peritoneal implant is mildly decreased in size in the interval. 2. No new or progressive metastatic disease in the abdomen or pelvis. No ascites. 3. Aortic atherosclerosis.   04/02/2017 Imaging   Left lower quadrant peritoneal implant referenced on previous exam measures 1.7 x 1.3 cm, image 69 of series 2. Increased from 0.8 x 0.8 cm previously peer no new peritoneal implants identified.  Musculoskeletal: The degenerative disc disease noted within the lumbar spine.  IMPRESSION: 1. Solitary left pelvic peritoneal implant is increased in size in the interval. 2. No new sites of disease.  No ascites. 3. Aortic atherosclerosis   04/11/2017 PET scan   1. The left side of pelvis peritoneal implant has decreased in size and degree of FDG uptake compatible with response to therapy. No new areas of peritoneal disease identified. 2. Persistent diffuse increased uptake within the thyroid gland. Correlation with patient's thyroid function may be helpful.   05/26/2017 Imaging   1. Enlarging tumor implant along the left adnexa, currently 2.6 by 2.4 cm and previously 1.9 by 1.3 cm. No new tumor implant or other specific cause for the patient's pelvic symptoms is currently identified. 2.  Aortic Atherosclerosis (ICD10-I70.0). 3. Lumbar spondylosis and degenerative disc disease causing multilevel impingement.   06/04/2017 - 08/28/2017 Chemotherapy   The patient started letrozole and everolimus   08/26/2017 Imaging   Increased size of mass in the left adnexal region.  Several new small liver metastases in right hepatic lobe   09/08/2017 Imaging   LV  EF: 60% -  65%   09/10/2017 Procedure   Technically successful right IJ power-injectable port catheter placement. Ready for routine use   12/04/2017 Imaging   1. Interval increase in size and number of multiple lesions within the liver compatible with hepatic metastatic disease. 2. Interval increase in size mass within the left hemipelvis.   12/15/2017 -  Chemotherapy   The patient had pembrolizumab (KEYTRUDA) 200 mg in sodium chloride 0.9 % 50 mL chemo infusion, 200 mg, Intravenous, Once, 1 of 6 cycles Administration: 200 mg (12/15/2017)  for chemotherapy treatment.    12/21/2017 Imaging   1. Moderate left hydronephrosis and hydroureter, similar compared to most recent CT from May 2019; obstruction appears to be secondary to a left pelvic mass/metastatic focus which has increased in size since the prior CT. 2. Numerous hepatic metastatic lesions, suspect that some may be increased in size but difficult to further characterize without intravenous contrast. Increased size of left pelvic soft tissue mass/metastatic lesion. Mass abuts and possibly invades the sigmoid colon.    02/20/2018 Imaging   Interval decrease in hepatic metastases.  Decreased mass or lymphadenopathy in the left external iliac chain. Interval resolution of left hydroureteronephrosis.  No new or progressive disease within the abdomen or pelvis.   05/16/2018 Imaging   05/16/2018 CT Abdomen IMPRESSION: 1. Slight interval decrease in size of hepatic metastatic disease. 2. Slight interval decrease in size centrally necrotic mass within the left external iliac region.   09/18/2018 Imaging   1. Today's study demonstrates a mixed response to therapy. Specifically, while the previously noted hepatic metastases appear decreased, the soft tissue mass along the left pelvic sidewall appears slightly larger than the prior study. No  new metastatic disease is noted elsewhere in the abdomen or pelvis. 2. Aortic atherosclerosis. 3. Mild  cardiomegaly. 4. Additional incidental findings, as above    03/12/2019 Imaging   1. Overall improvement with reduced size of the hepatic metastatic lesions, and reduced size of the irregular soft tissue mass in the left pelvis. 2. Other imaging findings of potential clinical significance: Mild distal esophageal wall thickening, cannot exclude esophagitis. Aortic Atherosclerosis (ICD10-I70.0). Lumbar impingement at L5-S1.       REVIEW OF SYSTEMS:   Constitutional: Denies fevers, chills or abnormal weight loss Eyes: Denies blurriness of vision Ears, nose, mouth, throat, and face: Denies mucositis or sore throat Respiratory: Denies cough, dyspnea or wheezes Cardiovascular: Denies palpitation, chest discomfort or lower extremity swelling Gastrointestinal:  Denies nausea, heartburn or change in bowel habits Skin: Denies abnormal skin rashes Lymphatics: Denies new lymphadenopathy or easy bruising Neurological:Denies numbness, tingling or new weaknesses Behavioral/Psych: Mood is stable, no new changes  All other systems were reviewed with the patient and are negative.  I have reviewed the past medical history, past surgical history, social history and family history with the patient and they are unchanged from previous note.  ALLERGIES:  is allergic to latex.  MEDICATIONS:  Current Outpatient Medications  Medication Sig Dispense Refill  . acetaminophen (TYLENOL) 325 MG tablet Take 650 mg by mouth every 6 (six) hours as needed for mild pain or moderate pain.     Marland Kitchen amoxicillin (AMOXIL) 500 MG capsule Take 2,000 mg by mouth See admin instructions. Prior to dental appointment, takes 4 tables prior to appt    . Biotin w/ Vitamins C & E (HAIR/SKIN/NAILS PO) Take 1 tablet by mouth daily.    . Calcium Carb-Cholecalciferol (CALCIUM 600 + D PO) Take 2 tablets by mouth daily.    . fluticasone (FLONASE) 50 MCG/ACT nasal spray Place 1 spray into both nostrils 2 (two) times daily as needed for  allergies.   1  . gabapentin (NEURONTIN) 600 MG tablet TAKE 1 TABLET BY MOUTH TWICE A DAY (Patient taking differently: Take 1 tablet 2 times a day) 60 tablet 9  . gabapentin (NEURONTIN) 600 MG tablet TAKE 1 TABLET BY MOUTH TWICE A DAY FOR RESTLESS LEGS AND NEUROPATHY.    Marland Kitchen ibuprofen (ADVIL,MOTRIN) 200 MG tablet Take 400 mg by mouth every 8 (eight) hours as needed for mild pain.    Marland Kitchen levothyroxine (SYNTHROID) 200 MCG tablet TAKE 1 TABLET (200 MCG TOTAL) BY MOUTH DAILY BEFORE BREAKFAST. 90 tablet 0  . lidocaine-prilocaine (EMLA) cream APPLY TO AFFECTED AREA ONCE 30 g 3  . loratadine (CLARITIN) 10 MG tablet Take 10 mg by mouth daily.    Marland Kitchen LORazepam (ATIVAN) 1 MG tablet Take 1 tablet (1 mg total) by mouth every 8 (eight) hours as needed for anxiety or sleep. 90 tablet 3  . losartan-hydrochlorothiazide (HYZAAR) 100-12.5 MG tablet Take 0.5 tablets by mouth daily. 60 tablet 9  . Magnesium 250 MG TABS Take 250 mg by mouth daily.    . magnesium oxide (MAG-OX) 400 (241.3 Mg) MG tablet Take 1 tablet (400 mg total) by mouth 2 (two) times daily. 60 tablet 9  . magnesium oxide (MAG-OX) 400 MG tablet Take 1 tablet by mouth 2 (two) times daily.    . Melatonin 10 MG CAPS Take 10 mg by mouth at bedtime.    . niacinamide 500 MG tablet Take 500 mg by mouth 3 (three) times daily with meals.    . ondansetron (ZOFRAN) 8 MG tablet Take  1 tablet (8 mg total) by mouth every 8 (eight) hours as needed for nausea. 60 tablet 1  . potassium gluconate 595 (99 K) MG TABS tablet Take by mouth.    . pramipexole (MIRAPEX) 0.5 MG tablet Take 1 mg by mouth at bedtime.    . traZODone (DESYREL) 50 MG tablet Take 1.5 tablets (75 mg total) by mouth at bedtime. (Patient taking differently: Take 50 mg by mouth at bedtime. ) 135 tablet 1   No current facility-administered medications for this visit.    Facility-Administered Medications Ordered in Other Visits  Medication Dose Route Frequency Provider Last Rate Last Dose  . heparin lock  flush 100 unit/mL  500 Units Intracatheter Once PRN Alvy Bimler, Modena Bellemare, MD      . pembrolizumab (KEYTRUDA) 200 mg in sodium chloride 0.9 % 50 mL chemo infusion  200 mg Intravenous Once Jakayla Schweppe, MD      . sodium chloride flush (NS) 0.9 % injection 10 mL  10 mL Intracatheter PRN Alvy Bimler, Scorpio Fortin, MD        PHYSICAL EXAMINATION: ECOG PERFORMANCE STATUS: 1 - Symptomatic but completely ambulatory  Vitals:   04/27/19 1315  BP: (!) 151/86  Pulse: 82  Resp: 18  Temp: 98 F (36.7 C)  SpO2: 99%   Filed Weights   04/27/19 1315  Weight: 211 lb (95.7 kg)    GENERAL:alert, no distress and comfortable SKIN: skin color, texture, turgor are normal, no rashes or significant lesions EYES: normal, Conjunctiva are pink and non-injected, sclera clear OROPHARYNX:no exudate, no erythema and lips, buccal mucosa, and tongue normal  NECK: supple, thyroid normal size, non-tender, without nodularity LYMPH:  no palpable lymphadenopathy in the cervical, axillary or inguinal LUNGS: clear to auscultation and percussion with normal breathing effort HEART: regular rate & rhythm and no murmurs and no lower extremity edema ABDOMEN:abdomen soft, non-tender and normal bowel sounds Musculoskeletal:no cyanosis of digits and no clubbing  NEURO: alert & oriented x 3 with fluent speech, no focal motor/sensory deficits  LABORATORY DATA:  I have reviewed the data as listed    Component Value Date/Time   NA 137 04/27/2019 1305   NA 141 07/09/2017 1312   K 3.7 04/27/2019 1305   K 3.9 07/09/2017 1312   CL 101 04/27/2019 1305   CO2 25 04/27/2019 1305   CO2 27 07/09/2017 1312   GLUCOSE 105 (H) 04/27/2019 1305   GLUCOSE 109 07/09/2017 1312   BUN 19 04/27/2019 1305   BUN 16.4 07/09/2017 1312   CREATININE 0.83 04/27/2019 1305   CREATININE 0.91 01/04/2019 1351   CREATININE 0.8 07/09/2017 1312   CALCIUM 9.7 04/27/2019 1305   CALCIUM 9.2 07/09/2017 1312   PROT 6.9 04/27/2019 1305   PROT 7.1 07/09/2017 1312   ALBUMIN 3.6  04/27/2019 1305   ALBUMIN 3.1 (L) 07/09/2017 1312   AST 17 04/27/2019 1305   AST 19 01/04/2019 1351   AST 35 (H) 07/09/2017 1312   ALT 13 04/27/2019 1305   ALT 20 01/04/2019 1351   ALT 43 07/09/2017 1312   ALKPHOS 107 04/27/2019 1305   ALKPHOS 182 (H) 07/09/2017 1312   BILITOT 0.5 04/27/2019 1305   BILITOT 0.3 01/04/2019 1351   BILITOT 0.34 07/09/2017 1312   GFRNONAA >60 04/27/2019 1305   GFRNONAA >60 01/04/2019 1351   GFRAA >60 04/27/2019 1305   GFRAA >60 01/04/2019 1351    No results found for: SPEP, UPEP  Lab Results  Component Value Date   WBC 7.8 04/27/2019   NEUTROABS 5.5  04/27/2019   HGB 12.1 04/27/2019   HCT 36.0 04/27/2019   MCV 85.9 04/27/2019   PLT 204 04/27/2019      Chemistry      Component Value Date/Time   NA 137 04/27/2019 1305   NA 141 07/09/2017 1312   K 3.7 04/27/2019 1305   K 3.9 07/09/2017 1312   CL 101 04/27/2019 1305   CO2 25 04/27/2019 1305   CO2 27 07/09/2017 1312   BUN 19 04/27/2019 1305   BUN 16.4 07/09/2017 1312   CREATININE 0.83 04/27/2019 1305   CREATININE 0.91 01/04/2019 1351   CREATININE 0.8 07/09/2017 1312      Component Value Date/Time   CALCIUM 9.7 04/27/2019 1305   CALCIUM 9.2 07/09/2017 1312   ALKPHOS 107 04/27/2019 1305   ALKPHOS 182 (H) 07/09/2017 1312   AST 17 04/27/2019 1305   AST 19 01/04/2019 1351   AST 35 (H) 07/09/2017 1312   ALT 13 04/27/2019 1305   ALT 20 01/04/2019 1351   ALT 43 07/09/2017 1312   BILITOT 0.5 04/27/2019 1305   BILITOT 0.3 01/04/2019 1351   BILITOT 0.34 07/09/2017 1312       All questions were answered. The patient knows to call the clinic with any problems, questions or concerns. No barriers to learning was detected.  I spent 15 minutes counseling the patient face to face. The total time spent in the appointment was 20 minutes and more than 50% was on counseling and review of test results  Heath Lark, MD 04/27/2019 2:27 PM

## 2019-04-27 NOTE — Assessment & Plan Note (Signed)
Her recent TSH is satisfactory She will continue chronic thyroid replacement therapy We will adjust her thyroid medicine as needed 

## 2019-04-28 ENCOUNTER — Telehealth: Payer: Self-pay | Admitting: Hematology and Oncology

## 2019-04-28 NOTE — Telephone Encounter (Signed)
Scheduled appt per 10/20 sch message - unable to reach pt . Left message with appt date and time - mailed reminder letter with appt

## 2019-05-18 ENCOUNTER — Other Ambulatory Visit: Payer: Self-pay

## 2019-05-18 ENCOUNTER — Inpatient Hospital Stay: Payer: Medicare Other

## 2019-05-18 ENCOUNTER — Inpatient Hospital Stay: Payer: Medicare Other | Attending: Hematology and Oncology

## 2019-05-18 VITALS — BP 144/75 | HR 68 | Temp 98.9°F | Resp 20 | Wt 209.5 lb

## 2019-05-18 DIAGNOSIS — C541 Malignant neoplasm of endometrium: Secondary | ICD-10-CM | POA: Diagnosis present

## 2019-05-18 DIAGNOSIS — E039 Hypothyroidism, unspecified: Secondary | ICD-10-CM

## 2019-05-18 DIAGNOSIS — C787 Secondary malignant neoplasm of liver and intrahepatic bile duct: Secondary | ICD-10-CM

## 2019-05-18 DIAGNOSIS — Z79899 Other long term (current) drug therapy: Secondary | ICD-10-CM | POA: Insufficient documentation

## 2019-05-18 DIAGNOSIS — Z5112 Encounter for antineoplastic immunotherapy: Secondary | ICD-10-CM | POA: Diagnosis present

## 2019-05-18 LAB — COMPREHENSIVE METABOLIC PANEL
ALT: 13 U/L (ref 0–44)
AST: 15 U/L (ref 15–41)
Albumin: 3.7 g/dL (ref 3.5–5.0)
Alkaline Phosphatase: 105 U/L (ref 38–126)
Anion gap: 9 (ref 5–15)
BUN: 19 mg/dL (ref 8–23)
CO2: 26 mmol/L (ref 22–32)
Calcium: 9.3 mg/dL (ref 8.9–10.3)
Chloride: 101 mmol/L (ref 98–111)
Creatinine, Ser: 0.93 mg/dL (ref 0.44–1.00)
GFR calc Af Amer: >60 mL/min (ref >60)
GFR calc non Af Amer: >60 mL/min (ref >60)
Glucose, Bld: 105 mg/dL — ABNORMAL HIGH (ref 70–99)
Potassium: 3.6 mmol/L (ref 3.5–5.1)
Sodium: 136 mmol/L (ref 135–145)
Total Bilirubin: 0.5 mg/dL (ref 0.3–1.2)
Total Protein: 7 g/dL (ref 6.5–8.1)

## 2019-05-18 LAB — CBC WITH DIFFERENTIAL/PLATELET
Abs Immature Granulocytes: 0.02 10*3/uL (ref 0.00–0.07)
Basophils Absolute: 0.1 10*3/uL (ref 0.0–0.1)
Basophils Relative: 1 %
Eosinophils Absolute: 0.2 10*3/uL (ref 0.0–0.5)
Eosinophils Relative: 3 %
HCT: 36.8 % (ref 36.0–46.0)
Hemoglobin: 12.2 g/dL (ref 12.0–15.0)
Immature Granulocytes: 0 %
Lymphocytes Relative: 20 %
Lymphs Abs: 1.6 10*3/uL (ref 0.7–4.0)
MCH: 28.6 pg (ref 26.0–34.0)
MCHC: 33.2 g/dL (ref 30.0–36.0)
MCV: 86.2 fL (ref 80.0–100.0)
Monocytes Absolute: 0.5 10*3/uL (ref 0.1–1.0)
Monocytes Relative: 6 %
Neutro Abs: 5.7 10*3/uL (ref 1.7–7.7)
Neutrophils Relative %: 70 %
Platelets: 205 10*3/uL (ref 150–400)
RBC: 4.27 MIL/uL (ref 3.87–5.11)
RDW: 12.5 % (ref 11.5–15.5)
WBC: 8.1 10*3/uL (ref 4.0–10.5)
nRBC: 0 % (ref 0.0–0.2)

## 2019-05-18 LAB — TSH: TSH: 2.041 u[IU]/mL (ref 0.308–3.960)

## 2019-05-18 MED ORDER — SODIUM CHLORIDE 0.9% FLUSH
10.0000 mL | INTRAVENOUS | Status: DC | PRN
  Administered 2019-05-18: 10 mL
  Filled 2019-05-18: qty 10

## 2019-05-18 MED ORDER — HEPARIN SOD (PORK) LOCK FLUSH 100 UNIT/ML IV SOLN
500.0000 [IU] | Freq: Once | INTRAVENOUS | Status: AC | PRN
  Administered 2019-05-18: 500 [IU]
  Filled 2019-05-18: qty 5

## 2019-05-18 MED ORDER — SODIUM CHLORIDE 0.9% FLUSH
10.0000 mL | Freq: Once | INTRAVENOUS | Status: AC
  Administered 2019-05-18: 10 mL
  Filled 2019-05-18: qty 10

## 2019-05-18 MED ORDER — SODIUM CHLORIDE 0.9 % IV SOLN
200.0000 mg | Freq: Once | INTRAVENOUS | Status: AC
  Administered 2019-05-18: 200 mg via INTRAVENOUS
  Filled 2019-05-18: qty 8

## 2019-05-18 MED ORDER — SODIUM CHLORIDE 0.9 % IV SOLN
Freq: Once | INTRAVENOUS | Status: AC
  Administered 2019-05-18: 15:00:00 via INTRAVENOUS
  Filled 2019-05-18: qty 250

## 2019-05-18 NOTE — Patient Instructions (Signed)
[facility] Discharge Instructions for Patients Receiving Chemotherapy  Today you received the following chemotherapy agents Pembrolizumab (KEYTRUDA).  To help prevent nausea and vomiting after your treatment, we encourage you to take your nausea medication as prescribed.   If you develop nausea and vomiting that is not controlled by your nausea medication, call the clinic.   BELOW ARE SYMPTOMS THAT SHOULD BE REPORTED IMMEDIATELY:  *FEVER GREATER THAN 100.5 F  *CHILLS WITH OR WITHOUT FEVER  NAUSEA AND VOMITING THAT IS NOT CONTROLLED WITH YOUR NAUSEA MEDICATION  *UNUSUAL SHORTNESS OF BREATH  *UNUSUAL BRUISING OR BLEEDING  TENDERNESS IN MOUTH AND THROAT WITH OR WITHOUT PRESENCE OF ULCERS  *URINARY PROBLEMS  *BOWEL PROBLEMS  UNUSUAL RASH Items with * indicate a potential emergency and should be followed up as soon as possible.  Feel free to call the clinic should you have any questions or concerns. The clinic phone number is [phone].  Please show the CHEMO ALERT CARD at check-in to the Emergency Department and triage nurse.  Coronavirus (COVID-19) Are you at risk?  Are you at risk for the Coronavirus (COVID-19)?  To be considered HIGH RISK for Coronavirus (COVID-19), you have to meet the following criteria:  . Traveled to [place], [place], [place], [place] or [place]; or in the [place] to [place], [place], [place], or [place]; and have fever, cough, and shortness of breath within the last 2 weeks of travel OR . Been in close contact with a person diagnosed with COVID-19 within the last 2 weeks and have fever, cough, and shortness of breath . IF YOU DO NOT MEET THESE CRITERIA, YOU ARE CONSIDERED LOW RISK FOR COVID-19.  What to do if you are HIGH RISK for COVID-19?  . If you are having a medical emergency, call 911. . Seek medical care right away. Before you go to a doctor's office, urgent care or emergency department, call ahead and tell  them about your recent travel, contact with someone diagnosed with COVID-19, and your symptoms. You should receive instructions from your physician's office regarding next steps of care.  . When you arrive at healthcare provider, tell the healthcare staff immediately you have returned from visiting [place], [place], [place], [place] or [place]; or traveled in the [place] to [place], [place], [place], or [place]; in the last two weeks or you have been in close contact with a person diagnosed with COVID-19 in the last 2 weeks.   . Tell the health care staff about your symptoms: fever, cough and shortness of breath. . After you have been seen by a medical provider, you will be either: o Tested for (COVID-19) and discharged home on quarantine except to seek medical care if symptoms worsen, and asked to  - Stay home and avoid contact with others until you get your results (4-5 days)  - Avoid travel on public transportation if possible (such as bus, train, or airplane) or o Sent to the Emergency Department by EMS for evaluation, COVID-19 testing, and possible admission depending on your condition and test results.  What to do if you are LOW RISK for COVID-19?  Reduce your risk of any infection by using the same precautions used for avoiding the common cold or flu:  . Wash your hands often with soap and warm water for at least 20 seconds.  If soap and water are not readily available, use an alcohol-based hand sanitizer with at least 60% alcohol.  . If coughing or   sneezing, cover your mouth and nose by coughing or sneezing into the elbow areas of your shirt or coat, into a tissue or into your sleeve (not your hands). . Avoid shaking hands with others and consider head nods or verbal greetings only. . Avoid touching your eyes, nose, or mouth with unwashed hands.  . Avoid close contact with people who are sick. . Avoid places or events with large numbers of people in one location, like concerts or  sporting events. . Carefully consider travel plans you have or are making. . If you are planning any travel outside or inside the [place], visit the CDC's Travelers' Health webpage for the latest health notices. . If you have some symptoms but not all symptoms, continue to monitor at home and seek medical attention if your symptoms worsen. . If you are having a medical emergency, call 911.   ADDITIONAL HEALTHCARE OPTIONS FOR PATIENTS  [facility] / e-Visit: [email or web address]         [facility]: [phone]  [facility]: [phone]                   [facility]: [phone]    

## 2019-05-18 NOTE — Patient Instructions (Signed)

## 2019-05-19 ENCOUNTER — Other Ambulatory Visit: Payer: Self-pay | Admitting: Hematology and Oncology

## 2019-05-19 ENCOUNTER — Telehealth: Payer: Self-pay | Admitting: *Deleted

## 2019-05-19 DIAGNOSIS — C541 Malignant neoplasm of endometrium: Secondary | ICD-10-CM

## 2019-05-19 MED ORDER — HYDROCODONE-ACETAMINOPHEN 10-325 MG PO TABS: 1.0000 | ORAL_TABLET | Freq: Four times a day (QID) | ORAL | 0 refills | Status: DC | PRN

## 2019-05-19 NOTE — Telephone Encounter (Signed)
Telephone call returned to patient. She would like to wait until December, when the scan is due, to have this done. She reports she will try ice/heat to alleviate the pain and only take pain medication when absolutely needed. She will contact us if she changes her mind or the pain intensifies.

## 2019-05-19 NOTE — Telephone Encounter (Signed)
We just did a scan on 9/4 which showed everything is better but if this is new then we can try to order a scan sooner than later (technically insurance only pays for a scan every 3 months but we can do peer to peer to have it done next week). In the mean time I do suggest pain medicine.

## 2019-05-19 NOTE — Telephone Encounter (Signed)
Patient called with complaints of left groin pain. This is the same pain she report in October. The pain has increased. No redness or swelling. The pain is localized to this one spot. The pain was so intense last night it woke her up. She has taken hydrocodone for it in the past. She reports this helps with the pain. But the pain is becoming more frequent and constant. Patient is asking what else can we do. Is it time for a scan?

## 2019-05-28 ENCOUNTER — Other Ambulatory Visit: Payer: Self-pay

## 2019-05-28 DIAGNOSIS — Z20822 Contact with and (suspected) exposure to covid-19: Secondary | ICD-10-CM

## 2019-05-30 LAB — NOVEL CORONAVIRUS, NAA: SARS-CoV-2, NAA: NOT DETECTED

## 2019-06-08 ENCOUNTER — Inpatient Hospital Stay: Payer: Medicare Other | Attending: Hematology and Oncology

## 2019-06-08 ENCOUNTER — Inpatient Hospital Stay: Payer: Medicare Other

## 2019-06-08 ENCOUNTER — Inpatient Hospital Stay (HOSPITAL_BASED_OUTPATIENT_CLINIC_OR_DEPARTMENT_OTHER): Payer: Medicare Other | Admitting: Hematology and Oncology

## 2019-06-08 ENCOUNTER — Other Ambulatory Visit: Payer: Self-pay

## 2019-06-08 ENCOUNTER — Other Ambulatory Visit: Payer: Self-pay | Admitting: Hematology and Oncology

## 2019-06-08 DIAGNOSIS — Z923 Personal history of irradiation: Secondary | ICD-10-CM | POA: Diagnosis not present

## 2019-06-08 DIAGNOSIS — Z791 Long term (current) use of non-steroidal anti-inflammatories (NSAID): Secondary | ICD-10-CM | POA: Diagnosis not present

## 2019-06-08 DIAGNOSIS — C541 Malignant neoplasm of endometrium: Secondary | ICD-10-CM

## 2019-06-08 DIAGNOSIS — E039 Hypothyroidism, unspecified: Secondary | ICD-10-CM | POA: Diagnosis not present

## 2019-06-08 DIAGNOSIS — Z90722 Acquired absence of ovaries, bilateral: Secondary | ICD-10-CM | POA: Insufficient documentation

## 2019-06-08 DIAGNOSIS — G4700 Insomnia, unspecified: Secondary | ICD-10-CM | POA: Insufficient documentation

## 2019-06-08 DIAGNOSIS — Z9079 Acquired absence of other genital organ(s): Secondary | ICD-10-CM | POA: Insufficient documentation

## 2019-06-08 DIAGNOSIS — G4701 Insomnia due to medical condition: Secondary | ICD-10-CM | POA: Diagnosis not present

## 2019-06-08 DIAGNOSIS — Z79899 Other long term (current) drug therapy: Secondary | ICD-10-CM | POA: Insufficient documentation

## 2019-06-08 DIAGNOSIS — Z9221 Personal history of antineoplastic chemotherapy: Secondary | ICD-10-CM | POA: Diagnosis not present

## 2019-06-08 DIAGNOSIS — F5104 Psychophysiologic insomnia: Secondary | ICD-10-CM | POA: Diagnosis not present

## 2019-06-08 DIAGNOSIS — C787 Secondary malignant neoplasm of liver and intrahepatic bile duct: Secondary | ICD-10-CM | POA: Insufficient documentation

## 2019-06-08 DIAGNOSIS — Z90710 Acquired absence of both cervix and uterus: Secondary | ICD-10-CM | POA: Diagnosis not present

## 2019-06-08 DIAGNOSIS — Z5112 Encounter for antineoplastic immunotherapy: Secondary | ICD-10-CM | POA: Diagnosis present

## 2019-06-08 LAB — COMPREHENSIVE METABOLIC PANEL
ALT: 14 U/L (ref 0–44)
AST: 15 U/L (ref 15–41)
Albumin: 3.6 g/dL (ref 3.5–5.0)
Alkaline Phosphatase: 104 U/L (ref 38–126)
Anion gap: 9 (ref 5–15)
BUN: 20 mg/dL (ref 8–23)
CO2: 25 mmol/L (ref 22–32)
Calcium: 9.2 mg/dL (ref 8.9–10.3)
Chloride: 104 mmol/L (ref 98–111)
Creatinine, Ser: 0.77 mg/dL (ref 0.44–1.00)
GFR calc Af Amer: >60 mL/min (ref >60)
GFR calc non Af Amer: >60 mL/min (ref >60)
Glucose, Bld: 84 mg/dL (ref 70–99)
Potassium: 3.7 mmol/L (ref 3.5–5.1)
Sodium: 138 mmol/L (ref 135–145)
Total Bilirubin: 0.4 mg/dL (ref 0.3–1.2)
Total Protein: 6.8 g/dL (ref 6.5–8.1)

## 2019-06-08 LAB — CBC WITH DIFFERENTIAL/PLATELET
Abs Immature Granulocytes: 0.02 10*3/uL (ref 0.00–0.07)
Basophils Absolute: 0.1 10*3/uL (ref 0.0–0.1)
Basophils Relative: 1 %
Eosinophils Absolute: 0.4 10*3/uL (ref 0.0–0.5)
Eosinophils Relative: 6 %
HCT: 36.8 % (ref 36.0–46.0)
Hemoglobin: 12.1 g/dL (ref 12.0–15.0)
Immature Granulocytes: 0 %
Lymphocytes Relative: 24 %
Lymphs Abs: 1.6 10*3/uL (ref 0.7–4.0)
MCH: 28.3 pg (ref 26.0–34.0)
MCHC: 32.9 g/dL (ref 30.0–36.0)
MCV: 86 fL (ref 80.0–100.0)
Monocytes Absolute: 0.5 10*3/uL (ref 0.1–1.0)
Monocytes Relative: 7 %
Neutro Abs: 4.2 10*3/uL (ref 1.7–7.7)
Neutrophils Relative %: 62 %
Platelets: 204 10*3/uL (ref 150–400)
RBC: 4.28 MIL/uL (ref 3.87–5.11)
RDW: 12.8 % (ref 11.5–15.5)
WBC: 6.7 10*3/uL (ref 4.0–10.5)
nRBC: 0 % (ref 0.0–0.2)

## 2019-06-08 LAB — TSH: TSH: 3.97 u[IU]/mL — ABNORMAL HIGH (ref 0.308–3.960)

## 2019-06-08 MED ORDER — SODIUM CHLORIDE 0.9% FLUSH
10.0000 mL | INTRAVENOUS | Status: DC | PRN
  Administered 2019-06-08: 10 mL
  Filled 2019-06-08: qty 10

## 2019-06-08 MED ORDER — SODIUM CHLORIDE 0.9% FLUSH
10.0000 mL | Freq: Once | INTRAVENOUS | Status: AC
  Administered 2019-06-08: 10 mL
  Filled 2019-06-08: qty 10

## 2019-06-08 MED ORDER — SODIUM CHLORIDE 0.9 % IV SOLN
Freq: Once | INTRAVENOUS | Status: AC
  Administered 2019-06-08: 13:00:00 via INTRAVENOUS
  Filled 2019-06-08: qty 250

## 2019-06-08 MED ORDER — LORAZEPAM 1 MG PO TABS: 1.0000 mg | ORAL_TABLET | Freq: Three times a day (TID) | ORAL | 3 refills | Status: DC | PRN

## 2019-06-08 MED ORDER — HEPARIN SOD (PORK) LOCK FLUSH 100 UNIT/ML IV SOLN
500.0000 [IU] | Freq: Once | INTRAVENOUS | Status: AC | PRN
  Administered 2019-06-08: 500 [IU]
  Filled 2019-06-08: qty 5

## 2019-06-08 MED ORDER — SODIUM CHLORIDE 0.9 % IV SOLN
200.0000 mg | Freq: Once | INTRAVENOUS | Status: AC
  Administered 2019-06-08: 200 mg via INTRAVENOUS
  Filled 2019-06-08: qty 8

## 2019-06-08 NOTE — Patient Instructions (Signed)

## 2019-06-08 NOTE — Patient Instructions (Signed)
[facility] Discharge Instructions for Patients Receiving Chemotherapy  Today you received the following chemotherapy agents Pembrolizumab (KEYTRUDA).  To help prevent nausea and vomiting after your treatment, we encourage you to take your nausea medication as prescribed.   If you develop nausea and vomiting that is not controlled by your nausea medication, call the clinic.   BELOW ARE SYMPTOMS THAT SHOULD BE REPORTED IMMEDIATELY:  *FEVER GREATER THAN 100.5 F  *CHILLS WITH OR WITHOUT FEVER  NAUSEA AND VOMITING THAT IS NOT CONTROLLED WITH YOUR NAUSEA MEDICATION  *UNUSUAL SHORTNESS OF BREATH  *UNUSUAL BRUISING OR BLEEDING  TENDERNESS IN MOUTH AND THROAT WITH OR WITHOUT PRESENCE OF ULCERS  *URINARY PROBLEMS  *BOWEL PROBLEMS  UNUSUAL RASH Items with * indicate a potential emergency and should be followed up as soon as possible.  Feel free to call the clinic should you have any questions or concerns. The clinic phone number is [phone].  Please show the CHEMO ALERT CARD at check-in to the Emergency Department and triage nurse.   

## 2019-06-09 ENCOUNTER — Encounter: Payer: Self-pay | Admitting: Hematology and Oncology

## 2019-06-09 NOTE — Assessment & Plan Note (Signed)
Her recent TSH is satisfactory She will continue chronic thyroid replacement therapy We will adjust her thyroid medicine as needed

## 2019-06-09 NOTE — Progress Notes (Signed)
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[place]) Overall, she is not symptomatic except for one episode of abdominal pain that has subsequently resolved Her last imaging study in September was positive response to therapy Plan to repeat imaging study in March 2021 unless she have new symptoms She will continue treatment every 3 weeks   Insomnia disorder She continues to have chronic insomnia I refill her prescription lorazepam  Acquired hypothyroidism Her recent TSH is satisfactory She will continue chronic thyroid replacement therapy We will adjust her thyroid medicine as needed   No orders of the defined types were placed in this encounter.   INTERVAL HISTORY: Please see below for problem oriented charting. She returns for further follow-up She had one episode of severe abdominal pain that has resolved No recent nausea or changes in bowel habits She tolerated treatment well without recent infusion reaction  SUMMARY OF ONCOLOGIC HISTORY: Oncology History Overview Note  MSI stable on June 2017 tissue but high from [facility] study from November 2017  05/15/16: ER is moderately positive (70%). PR is strongly positive (80%).   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 Lymph node, sentinel, biopsy, right obturator ONE BENIGN LYMPH NODE (0/1) 3. Lymph nodes, regional resection, left pelvic FOUR BENIGN LYMPH NODES (0/4)   01/02/2016 Surgery   Dr. Denman George performed robotic-assisted laparoscopic total  hysterectomy with bilateral salpingoophorectomy, sentinel lymph node biopsy, lymphadenectomy     05/15/2016 Pathology Results   Vagina, biopsy, mid - ADENOCARCINOMA, SEE COMMENT. Microscopic Comment The morphology along with the patient's history are consistent with recurrent endometrioid adenocarcinoma. The carcinoma has a similar appearance to the primary ([id number]).   05/20/2016 Imaging   Ct scan abdomen showed solid 2.5 cm peritoneal mass in the mid to anterior left pelvis, suspicious for peritoneal metastasis. 2. Small expansile low-attenuation filling defect in the left external iliac vein, cannot exclude a small deep venous thrombus. Consider correlation with left lower extremity venous Doppler scan. 3. Small simple fluid density structure in the left pelvic sidewall abutting the left external iliac vessels, favor a small postoperative seroma. 4. No ascites. 5. No lymphadenopathy.  No metastatic disease in the chest. 6. Aortic atherosclerosis.   06/12/2016 PET scan   Intensely hypermetabolic 2.1 cm central pelvic peritoneal mass just to the left of midline, consistent with peritoneal metastatic recurrence. No ascites. 2. No additional hypermetabolic sites of metastatic disease. 3. Diffuse thyroid hypermetabolism without discrete thyroid nodule, favoring thyroiditis. Recommend correlation with serum thyroid function tests.   06/24/2016 - 07/22/2016 Chemotherapy   The patient had weekly cisplatin. She has missed several doses due to infection and pancytopenia   06/24/2016 - 09/04/2016 Radiation Therapy   She completed concurrent radiation therapy Radiation treatment dates:   IMRT : 06/24/16 - 08/01/16 HDR : 08/13/16, 08/20/16, 08/27/16, 09/04/16  Site/dose:   Pelvis treated to 55 Gy in 25 fractions (simultaneous integrated boost technique) Vaginal Cuff treated to 24 Gy in 4 fractions   08/15/2016 - 12/03/2016 Chemotherapy   She received 6 cycles of carboplatin/Taxol   09/27/2016 Imaging    Interval decrease in size of previously described  solid peritoneal nodule within the left anterior pelvis. Near complete resolution of previously described low-attenuation structure along the left pelvic sidewall. No evidence for metastatic disease in the chest. Aortic atherosclerosis.   12/30/2016 Imaging   Ct abdomen 1. Solitary left pelvic peritoneal implant is mildly decreased in size in the interval. 2. No new or progressive metastatic disease in the abdomen or pelvis. No ascites. 3. Aortic atherosclerosis.   04/02/2017 Imaging   Left lower quadrant peritoneal implant referenced on previous exam measures 1.7 x 1.3 cm, image 69 of series 2. Increased from 0.8 x 0.8 cm previously peer no new peritoneal implants identified.  Musculoskeletal: The degenerative disc disease noted within the lumbar spine.  IMPRESSION: 1. Solitary left pelvic peritoneal implant is increased in size in the interval. 2. No new sites of disease.  No ascites. 3. Aortic atherosclerosis   04/11/2017 PET scan   1. The left side of pelvis peritoneal implant has decreased in size and degree of FDG uptake compatible with response to therapy. No new areas of peritoneal disease identified. 2. Persistent diffuse increased uptake within the thyroid gland. Correlation with patient's thyroid function may be helpful.   05/26/2017 Imaging   1. Enlarging tumor implant along the left adnexa, currently 2.6 by 2.4 cm and previously 1.9 by 1.3 cm. No new tumor implant or other specific cause for the patient's pelvic symptoms is currently identified. 2.  Aortic Atherosclerosis (ICD10-I70.0). 3. Lumbar spondylosis and degenerative disc disease causing multilevel impingement.   06/04/2017 - 08/28/2017 Chemotherapy   The patient started letrozole and everolimus   08/26/2017 Imaging   Increased size of mass in the left adnexal region.  Several new small liver metastases in right hepatic lobe   09/08/2017 Imaging   LV EF: 60% -   65%   09/10/2017 Procedure   Technically successful right IJ power-injectable port catheter placement. Ready for routine use   12/04/2017 Imaging   1. Interval increase in size and number of multiple lesions within the liver compatible with hepatic metastatic disease. 2. Interval increase in size mass within the left hemipelvis.   12/15/2017 -  Chemotherapy   The patient had pembrolizumab (KEYTRUDA) 200 mg in sodium chloride 0.9 % 50 mL chemo infusion, 200 mg, Intravenous, Once, 1 of 6 cycles Administration: 200 mg (12/15/2017)  for chemotherapy treatment.    12/21/2017 Imaging   1. Moderate left hydronephrosis and hydroureter, similar compared to most recent CT from May 2019; obstruction appears to be secondary to a left pelvic mass/metastatic focus which has increased in size since the prior CT. 2. Numerous hepatic metastatic lesions, suspect that some may be increased in size but difficult to further characterize without intravenous contrast. Increased size of left pelvic soft tissue mass/metastatic lesion. Mass abuts and possibly invades the sigmoid colon.    02/20/2018 Imaging   Interval decrease in hepatic metastases.  Decreased mass or lymphadenopathy in the left external iliac chain. Interval resolution of left hydroureteronephrosis.  No new or progressive disease within the abdomen or pelvis.   05/16/2018 Imaging   05/16/2018 CT Abdomen IMPRESSION: 1. Slight interval decrease in size of hepatic metastatic disease. 2. Slight interval decrease in size centrally necrotic mass within the left external iliac region.   09/18/2018 Imaging   1. Today's study demonstrates a mixed response to therapy. Specifically, while the previously noted hepatic metastases appear decreased, the soft tissue mass along the left pelvic sidewall appears slightly larger than the prior study. No new metastatic disease is noted elsewhere  in the abdomen or pelvis. 2. Aortic atherosclerosis. 3. Mild  cardiomegaly. 4. Additional incidental findings, as above    03/12/2019 Imaging   1. Overall improvement with reduced size of the hepatic metastatic lesions, and reduced size of the irregular soft tissue mass in the left pelvis. 2. Other imaging findings of potential clinical significance: Mild distal esophageal wall thickening, cannot exclude esophagitis. Aortic Atherosclerosis (ICD10-I70.0). Lumbar impingement at L5-S1.       REVIEW OF SYSTEMS:   Constitutional: Denies fevers, chills or abnormal weight loss Eyes: Denies blurriness of vision Ears, nose, mouth, throat, and face: Denies mucositis or sore throat Respiratory: Denies cough, dyspnea or wheezes Cardiovascular: Denies palpitation, chest discomfort or lower extremity swelling Gastrointestinal:  Denies nausea, heartburn or change in bowel habits Skin: Denies abnormal skin rashes Lymphatics: Denies new lymphadenopathy or easy bruising Neurological:Denies numbness, tingling or new weaknesses Behavioral/Psych: Mood is stable, no new changes  All other systems were reviewed with the patient and are negative.  I have reviewed the past medical history, past surgical history, social history and family history with the patient and they are unchanged from previous note.  ALLERGIES:  is allergic to latex.  MEDICATIONS:  Current Outpatient Medications  Medication Sig Dispense Refill  . acetaminophen (TYLENOL) 325 MG tablet Take 650 mg by mouth every 6 (six) hours as needed for mild pain or moderate pain.     Marland Kitchen amoxicillin (AMOXIL) 500 MG capsule Take 2,000 mg by mouth See admin instructions. Prior to dental appointment, takes 4 tables prior to appt    . Biotin w/ Vitamins C & E (HAIR/SKIN/NAILS PO) Take 1 tablet by mouth daily.    . Calcium Carb-Cholecalciferol (CALCIUM 600 + D PO) Take 2 tablets by mouth daily.    . fluticasone (FLONASE) 50 MCG/ACT nasal spray Place 1 spray into both nostrils 2 (two) times daily as needed for  allergies.   1  . gabapentin (NEURONTIN) 600 MG tablet TAKE 1 TABLET BY MOUTH TWICE A DAY (Patient taking differently: Take 1 tablet 2 times a day) 60 tablet 9  . gabapentin (NEURONTIN) 600 MG tablet TAKE 1 TABLET BY MOUTH TWICE A DAY FOR RESTLESS LEGS AND NEUROPATHY.    Marland Kitchen HYDROcodone-acetaminophen (NORCO) 10-325 MG tablet Take 1 tablet by mouth every 6 (six) hours as needed. 30 tablet 0  . ibuprofen (ADVIL,MOTRIN) 200 MG tablet Take 400 mg by mouth every 8 (eight) hours as needed for mild pain.    Marland Kitchen levothyroxine (SYNTHROID) 200 MCG tablet Take 1 tablet (200 mcg total) by mouth daily before breakfast. 90 tablet 11  . lidocaine-prilocaine (EMLA) cream APPLY TO AFFECTED AREA ONCE 30 g 3  . loratadine (CLARITIN) 10 MG tablet Take 10 mg by mouth daily.    Marland Kitchen LORazepam (ATIVAN) 1 MG tablet Take 1 tablet (1 mg total) by mouth every 8 (eight) hours as needed for anxiety or sleep. 90 tablet 3  . losartan-hydrochlorothiazide (HYZAAR) 100-12.5 MG tablet Take 0.5 tablets by mouth daily. 60 tablet 9  . Magnesium 250 MG TABS Take 250 mg by mouth daily.    . magnesium oxide (MAG-OX) 400 MG tablet Take 1 tablet by mouth 2 (two) times daily.    . Melatonin 10 MG CAPS Take 10 mg by mouth at bedtime.    . niacinamide 500 MG tablet Take 500 mg by mouth 3 (three) times daily with meals.    . ondansetron (ZOFRAN) 8 MG tablet Take 1 tablet (8 mg total) by mouth every 8 (eight)  hours as needed for nausea. 60 tablet 1  . potassium gluconate 595 (99 K) MG TABS tablet Take by mouth.    . pramipexole (MIRAPEX) 0.5 MG tablet Take 1 mg by mouth at bedtime.    . traZODone (DESYREL) 50 MG tablet Take 1.5 tablets (75 mg total) by mouth at bedtime. (Patient taking differently: Take 50 mg by mouth at bedtime. ) 135 tablet 1   No current facility-administered medications for this visit.     PHYSICAL EXAMINATION: ECOG PERFORMANCE STATUS: 1 - Symptomatic but completely ambulatory  Vitals:   06/08/19 1245  BP: 137/87  Pulse:  75  Resp: 18  Temp: 97.9 F (36.6 C)  SpO2: 100%   Filed Weights   06/08/19 1245  Weight: 215 lb 4.8 oz (97.7 kg)    GENERAL:alert, no distress and comfortable SKIN: skin color, texture, turgor are normal, no rashes or significant lesions EYES: normal, Conjunctiva are pink and non-injected, sclera clear OROPHARYNX:no exudate, no erythema and lips, buccal mucosa, and tongue normal  NECK: supple, thyroid normal size, non-tender, without nodularity LYMPH:  no palpable lymphadenopathy in the cervical, axillary or inguinal LUNGS: clear to auscultation and percussion with normal breathing effort HEART: regular rate & rhythm and no murmurs and no lower extremity edema ABDOMEN:abdomen soft, non-tender and normal bowel sounds Musculoskeletal:no cyanosis of digits and no clubbing  NEURO: alert & oriented x 3 with fluent speech, no focal motor/sensory deficits  LABORATORY DATA:  I have reviewed the data as listed    Component Value Date/Time   NA 138 06/08/2019 1230   NA 141 07/09/2017 1312   K 3.7 06/08/2019 1230   K 3.9 07/09/2017 1312   CL 104 06/08/2019 1230   CO2 25 06/08/2019 1230   CO2 27 07/09/2017 1312   GLUCOSE 84 06/08/2019 1230   GLUCOSE 109 07/09/2017 1312   BUN 20 06/08/2019 1230   BUN 16.4 07/09/2017 1312   CREATININE 0.77 06/08/2019 1230   CREATININE 0.91 01/04/2019 1351   CREATININE 0.8 07/09/2017 1312   CALCIUM 9.2 06/08/2019 1230   CALCIUM 9.2 07/09/2017 1312   PROT 6.8 06/08/2019 1230   PROT 7.1 07/09/2017 1312   ALBUMIN 3.6 06/08/2019 1230   ALBUMIN 3.1 (L) 07/09/2017 1312   AST 15 06/08/2019 1230   AST 19 01/04/2019 1351   AST 35 (H) 07/09/2017 1312   ALT 14 06/08/2019 1230   ALT 20 01/04/2019 1351   ALT 43 07/09/2017 1312   ALKPHOS 104 06/08/2019 1230   ALKPHOS 182 (H) 07/09/2017 1312   BILITOT 0.4 06/08/2019 1230   BILITOT 0.3 01/04/2019 1351   BILITOT 0.34 07/09/2017 1312   GFRNONAA >60 06/08/2019 1230   GFRNONAA >60 01/04/2019 1351   GFRAA  >60 06/08/2019 1230   GFRAA >60 01/04/2019 1351    No results found for: SPEP, UPEP  Lab Results  Component Value Date   WBC 6.7 06/08/2019   NEUTROABS 4.2 06/08/2019   HGB 12.1 06/08/2019   HCT 36.8 06/08/2019   MCV 86.0 06/08/2019   PLT 204 06/08/2019      Chemistry      Component Value Date/Time   NA 138 06/08/2019 1230   NA 141 07/09/2017 1312   K 3.7 06/08/2019 1230   K 3.9 07/09/2017 1312   CL 104 06/08/2019 1230   CO2 25 06/08/2019 1230   CO2 27 07/09/2017 1312   BUN 20 06/08/2019 1230   BUN 16.4 07/09/2017 1312   CREATININE 0.77 06/08/2019 1230   CREATININE  0.91 01/04/2019 1351   CREATININE 0.8 07/09/2017 1312      Component Value Date/Time   CALCIUM 9.2 06/08/2019 1230   CALCIUM 9.2 07/09/2017 1312   ALKPHOS 104 06/08/2019 1230   ALKPHOS 182 (H) 07/09/2017 1312   AST 15 06/08/2019 1230   AST 19 01/04/2019 1351   AST 35 (H) 07/09/2017 1312   ALT 14 06/08/2019 1230   ALT 20 01/04/2019 1351   ALT 43 07/09/2017 1312   BILITOT 0.4 06/08/2019 1230   BILITOT 0.3 01/04/2019 1351   BILITOT 0.34 07/09/2017 1312      All questions were answered. The patient knows to call the clinic with any problems, questions or concerns. No barriers to learning was detected.  I spent 15 minutes counseling the patient face to face. The total time spent in the appointment was 20 minutes and more than 50% was on counseling and review of test results  Heath Lark, MD 06/09/2019 9:36 AM

## 2019-06-09 NOTE — Assessment & Plan Note (Signed)
Overall, she is not symptomatic except for one episode of abdominal pain that has subsequently resolved Her last imaging study in September was positive response to therapy Plan to repeat imaging study in March 2021 unless she have new symptoms She will continue treatment every 3 weeks

## 2019-06-09 NOTE — Assessment & Plan Note (Signed)
She continues to have chronic insomnia I refill her prescription lorazepam

## 2019-06-11 ENCOUNTER — Telehealth: Payer: Self-pay | Admitting: Hematology and Oncology

## 2019-06-11 NOTE — Telephone Encounter (Signed)
Added January appointment. Left message for patient. Patient to get updated schedule at next visit.

## 2019-06-29 ENCOUNTER — Inpatient Hospital Stay: Payer: Medicare Other

## 2019-06-29 ENCOUNTER — Other Ambulatory Visit: Payer: Self-pay

## 2019-06-29 VITALS — BP 128/72 | HR 71 | Temp 98.3°F | Resp 16

## 2019-06-29 DIAGNOSIS — C787 Secondary malignant neoplasm of liver and intrahepatic bile duct: Secondary | ICD-10-CM

## 2019-06-29 DIAGNOSIS — E039 Hypothyroidism, unspecified: Secondary | ICD-10-CM

## 2019-06-29 DIAGNOSIS — C541 Malignant neoplasm of endometrium: Secondary | ICD-10-CM

## 2019-06-29 DIAGNOSIS — Z5112 Encounter for antineoplastic immunotherapy: Secondary | ICD-10-CM | POA: Diagnosis not present

## 2019-06-29 LAB — COMPREHENSIVE METABOLIC PANEL
ALT: 14 U/L (ref 0–44)
AST: 22 U/L (ref 15–41)
Albumin: 3.6 g/dL (ref 3.5–5.0)
Alkaline Phosphatase: 113 U/L (ref 38–126)
Anion gap: 11 (ref 5–15)
BUN: 21 mg/dL (ref 8–23)
CO2: 25 mmol/L (ref 22–32)
Calcium: 9 mg/dL (ref 8.9–10.3)
Chloride: 103 mmol/L (ref 98–111)
Creatinine, Ser: 0.84 mg/dL (ref 0.44–1.00)
GFR calc Af Amer: >60 mL/min (ref >60)
GFR calc non Af Amer: >60 mL/min (ref >60)
Glucose, Bld: 90 mg/dL (ref 70–99)
Potassium: 3.6 mmol/L (ref 3.5–5.1)
Sodium: 139 mmol/L (ref 135–145)
Total Bilirubin: 0.5 mg/dL (ref 0.3–1.2)
Total Protein: 6.9 g/dL (ref 6.5–8.1)

## 2019-06-29 LAB — CBC WITH DIFFERENTIAL/PLATELET
Abs Immature Granulocytes: 0.03 10*3/uL (ref 0.00–0.07)
Basophils Absolute: 0.1 10*3/uL (ref 0.0–0.1)
Basophils Relative: 1 %
Eosinophils Absolute: 0.4 10*3/uL (ref 0.0–0.5)
Eosinophils Relative: 5 %
HCT: 37.1 % (ref 36.0–46.0)
Hemoglobin: 12.4 g/dL (ref 12.0–15.0)
Immature Granulocytes: 0 %
Lymphocytes Relative: 23 %
Lymphs Abs: 1.6 10*3/uL (ref 0.7–4.0)
MCH: 28.3 pg (ref 26.0–34.0)
MCHC: 33.4 g/dL (ref 30.0–36.0)
MCV: 84.7 fL (ref 80.0–100.0)
Monocytes Absolute: 0.5 10*3/uL (ref 0.1–1.0)
Monocytes Relative: 7 %
Neutro Abs: 4.5 10*3/uL (ref 1.7–7.7)
Neutrophils Relative %: 64 %
Platelets: 190 10*3/uL (ref 150–400)
RBC: 4.38 MIL/uL (ref 3.87–5.11)
RDW: 12.9 % (ref 11.5–15.5)
WBC: 7 10*3/uL (ref 4.0–10.5)
nRBC: 0 % (ref 0.0–0.2)

## 2019-06-29 LAB — TSH: TSH: 2.233 u[IU]/mL (ref 0.308–3.960)

## 2019-06-29 MED ORDER — SODIUM CHLORIDE 0.9% FLUSH
10.0000 mL | INTRAVENOUS | Status: DC | PRN
  Administered 2019-06-29: 10 mL
  Filled 2019-06-29: qty 10

## 2019-06-29 MED ORDER — SODIUM CHLORIDE 0.9 % IV SOLN
200.0000 mg | Freq: Once | INTRAVENOUS | Status: AC
  Administered 2019-06-29: 200 mg via INTRAVENOUS
  Filled 2019-06-29: qty 8

## 2019-06-29 MED ORDER — SODIUM CHLORIDE 0.9 % IV SOLN
Freq: Once | INTRAVENOUS | Status: AC
  Filled 2019-06-29: qty 250

## 2019-06-29 MED ORDER — SODIUM CHLORIDE 0.9% FLUSH
10.0000 mL | Freq: Once | INTRAVENOUS | Status: AC
  Administered 2019-06-29: 10 mL
  Filled 2019-06-29: qty 10

## 2019-06-29 MED ORDER — HEPARIN SOD (PORK) LOCK FLUSH 100 UNIT/ML IV SOLN
500.0000 [IU] | Freq: Once | INTRAVENOUS | Status: AC | PRN
  Administered 2019-06-29: 500 [IU]
  Filled 2019-06-29: qty 5

## 2019-06-29 NOTE — Patient Instructions (Signed)

## 2019-06-29 NOTE — Patient Instructions (Signed)
[facility] Discharge Instructions for Patients Receiving Chemotherapy  Today you received the following chemotherapy agents Pembrolizumab (KEYTRUDA).  To help prevent nausea and vomiting after your treatment, we encourage you to take your nausea medication as prescribed.   If you develop nausea and vomiting that is not controlled by your nausea medication, call the clinic.   BELOW ARE SYMPTOMS THAT SHOULD BE REPORTED IMMEDIATELY:  *FEVER GREATER THAN 100.5 F  *CHILLS WITH OR WITHOUT FEVER  NAUSEA AND VOMITING THAT IS NOT CONTROLLED WITH YOUR NAUSEA MEDICATION  *UNUSUAL SHORTNESS OF BREATH  *UNUSUAL BRUISING OR BLEEDING  TENDERNESS IN MOUTH AND THROAT WITH OR WITHOUT PRESENCE OF ULCERS  *URINARY PROBLEMS  *BOWEL PROBLEMS  UNUSUAL RASH Items with * indicate a potential emergency and should be followed up as soon as possible.  Feel free to call the clinic should you have any questions or concerns. The clinic phone number is [phone].  Please show the CHEMO ALERT CARD at check-in to the Emergency Department and triage nurse.   

## 2019-07-20 ENCOUNTER — Inpatient Hospital Stay: Payer: Medicare Other | Attending: Hematology and Oncology | Admitting: Hematology and Oncology

## 2019-07-20 ENCOUNTER — Other Ambulatory Visit: Payer: Self-pay

## 2019-07-20 ENCOUNTER — Inpatient Hospital Stay: Payer: Medicare Other

## 2019-07-20 ENCOUNTER — Telehealth: Payer: Self-pay | Admitting: Hematology and Oncology

## 2019-07-20 DIAGNOSIS — Z9221 Personal history of antineoplastic chemotherapy: Secondary | ICD-10-CM | POA: Diagnosis not present

## 2019-07-20 DIAGNOSIS — Z90722 Acquired absence of ovaries, bilateral: Secondary | ICD-10-CM | POA: Insufficient documentation

## 2019-07-20 DIAGNOSIS — Z791 Long term (current) use of non-steroidal anti-inflammatories (NSAID): Secondary | ICD-10-CM | POA: Diagnosis not present

## 2019-07-20 DIAGNOSIS — C787 Secondary malignant neoplasm of liver and intrahepatic bile duct: Secondary | ICD-10-CM | POA: Insufficient documentation

## 2019-07-20 DIAGNOSIS — E039 Hypothyroidism, unspecified: Secondary | ICD-10-CM | POA: Insufficient documentation

## 2019-07-20 DIAGNOSIS — Z923 Personal history of irradiation: Secondary | ICD-10-CM | POA: Insufficient documentation

## 2019-07-20 DIAGNOSIS — Z9079 Acquired absence of other genital organ(s): Secondary | ICD-10-CM | POA: Diagnosis not present

## 2019-07-20 DIAGNOSIS — C541 Malignant neoplasm of endometrium: Secondary | ICD-10-CM | POA: Diagnosis present

## 2019-07-20 DIAGNOSIS — Z5112 Encounter for antineoplastic immunotherapy: Secondary | ICD-10-CM | POA: Diagnosis present

## 2019-07-20 DIAGNOSIS — Z79899 Other long term (current) drug therapy: Secondary | ICD-10-CM | POA: Insufficient documentation

## 2019-07-20 DIAGNOSIS — Z90710 Acquired absence of both cervix and uterus: Secondary | ICD-10-CM | POA: Insufficient documentation

## 2019-07-20 DIAGNOSIS — G2581 Restless legs syndrome: Secondary | ICD-10-CM

## 2019-07-20 LAB — CBC WITH DIFFERENTIAL/PLATELET
Abs Immature Granulocytes: 0.02 10*3/uL (ref 0.00–0.07)
Basophils Absolute: 0 10*3/uL (ref 0.0–0.1)
Basophils Relative: 1 %
Eosinophils Absolute: 0.3 10*3/uL (ref 0.0–0.5)
Eosinophils Relative: 4 %
HCT: 35.9 % — ABNORMAL LOW (ref 36.0–46.0)
Hemoglobin: 12 g/dL (ref 12.0–15.0)
Immature Granulocytes: 0 %
Lymphocytes Relative: 21 %
Lymphs Abs: 1.5 10*3/uL (ref 0.7–4.0)
MCH: 27.9 pg (ref 26.0–34.0)
MCHC: 33.4 g/dL (ref 30.0–36.0)
MCV: 83.5 fL (ref 80.0–100.0)
Monocytes Absolute: 0.6 10*3/uL (ref 0.1–1.0)
Monocytes Relative: 8 %
Neutro Abs: 4.7 10*3/uL (ref 1.7–7.7)
Neutrophils Relative %: 66 %
Platelets: 183 10*3/uL (ref 150–400)
RBC: 4.3 MIL/uL (ref 3.87–5.11)
RDW: 12.9 % (ref 11.5–15.5)
WBC: 7.1 10*3/uL (ref 4.0–10.5)
nRBC: 0 % (ref 0.0–0.2)

## 2019-07-20 LAB — COMPREHENSIVE METABOLIC PANEL
ALT: 13 U/L (ref 0–44)
AST: 15 U/L (ref 15–41)
Albumin: 3.6 g/dL (ref 3.5–5.0)
Alkaline Phosphatase: 123 U/L (ref 38–126)
Anion gap: 12 (ref 5–15)
BUN: 24 mg/dL — ABNORMAL HIGH (ref 8–23)
CO2: 24 mmol/L (ref 22–32)
Calcium: 8.8 mg/dL — ABNORMAL LOW (ref 8.9–10.3)
Chloride: 104 mmol/L (ref 98–111)
Creatinine, Ser: 0.78 mg/dL (ref 0.44–1.00)
GFR calc Af Amer: >60 mL/min (ref >60)
GFR calc non Af Amer: >60 mL/min (ref >60)
Glucose, Bld: 92 mg/dL (ref 70–99)
Potassium: 3.8 mmol/L (ref 3.5–5.1)
Sodium: 140 mmol/L (ref 135–145)
Total Bilirubin: 0.4 mg/dL (ref 0.3–1.2)
Total Protein: 6.9 g/dL (ref 6.5–8.1)

## 2019-07-20 LAB — TSH: TSH: 1.562 u[IU]/mL (ref 0.308–3.960)

## 2019-07-20 MED ORDER — SODIUM CHLORIDE 0.9 % IV SOLN
Freq: Once | INTRAVENOUS | Status: AC
  Filled 2019-07-20: qty 250

## 2019-07-20 MED ORDER — SODIUM CHLORIDE 0.9% FLUSH
10.0000 mL | INTRAVENOUS | Status: DC | PRN
  Administered 2019-07-20: 10 mL
  Filled 2019-07-20: qty 10

## 2019-07-20 MED ORDER — SODIUM CHLORIDE 0.9 % IV SOLN
200.0000 mg | Freq: Once | INTRAVENOUS | Status: AC
  Administered 2019-07-20: 200 mg via INTRAVENOUS
  Filled 2019-07-20: qty 8

## 2019-07-20 MED ORDER — HEPARIN SOD (PORK) LOCK FLUSH 100 UNIT/ML IV SOLN
500.0000 [IU] | Freq: Once | INTRAVENOUS | Status: AC | PRN
  Administered 2019-07-20: 500 [IU]
  Filled 2019-07-20: qty 5

## 2019-07-20 MED ORDER — SODIUM CHLORIDE 0.9% FLUSH
10.0000 mL | Freq: Once | INTRAVENOUS | Status: AC
  Administered 2019-07-20: 10 mL
  Filled 2019-07-20: qty 10

## 2019-07-20 NOTE — Patient Instructions (Signed)
[facility] Discharge Instructions for Patients Receiving Chemotherapy  Today you received the following chemotherapy agents Pembrolizumab (KEYTRUDA).  To help prevent nausea and vomiting after your treatment, we encourage you to take your nausea medication as prescribed.   If you develop nausea and vomiting that is not controlled by your nausea medication, call the clinic.   BELOW ARE SYMPTOMS THAT SHOULD BE REPORTED IMMEDIATELY:  *FEVER GREATER THAN 100.5 F  *CHILLS WITH OR WITHOUT FEVER  NAUSEA AND VOMITING THAT IS NOT CONTROLLED WITH YOUR NAUSEA MEDICATION  *UNUSUAL SHORTNESS OF BREATH  *UNUSUAL BRUISING OR BLEEDING  TENDERNESS IN MOUTH AND THROAT WITH OR WITHOUT PRESENCE OF ULCERS  *URINARY PROBLEMS  *BOWEL PROBLEMS  UNUSUAL RASH Items with * indicate a potential emergency and should be followed up as soon as possible.  Feel free to call the clinic should you have any questions or concerns. The clinic phone number is [phone].  Please show the CHEMO ALERT CARD at check-in to the Emergency Department and triage nurse.   

## 2019-07-20 NOTE — Telephone Encounter (Signed)
Scheduled appt per 1/12 sch message - pt to get an updated schedule after chemo

## 2019-07-21 ENCOUNTER — Encounter: Payer: Self-pay | Admitting: Hematology and Oncology

## 2019-07-21 NOTE — Assessment & Plan Note (Signed)
Her recent TSH is satisfactory She will continue chronic thyroid replacement therapy We will adjust her thyroid medicine as needed

## 2019-07-21 NOTE — Progress Notes (Signed)
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[place]) Overall, she is not symptomatic except for one episode of abdominal pain that has subsequently resolved Her last imaging study in September was positive response to therapy Plan to repeat imaging study in March 2021 unless she have new symptoms She will continue treatment every 3 weeks   Acquired hypothyroidism Her recent TSH is satisfactory She will continue chronic thyroid replacement therapy We will adjust her thyroid medicine as needed  Restless leg syndrome She has chronic restless leg Repeat iron studies are adequate She will continue her medication as directed   No orders of the defined types were placed in this encounter.   All questions were answered. The patient knows to call the clinic with any problems, questions or concerns. The total time spent in the appointment was 20 minutes encounter with patients including review of chart and various tests results, discussions about plan of care and coordination of care plan   Heath Lark, MD 07/21/2019 9:13 AM  INTERVAL HISTORY: Please see below for problem oriented charting. She returns for further follow-up She tolerated treatment well No recent infusion reaction She continues to be bothered by restless leg syndrome No recent abdominal pain, nausea or changes in bowel habits  SUMMARY OF ONCOLOGIC HISTORY: Oncology History Overview Note  MSI stable on June 2017 tissue but high from [facility] study from November 2017  05/15/16: ER is moderately positive (70%). PR is strongly positive (80%).   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 Lymph node, sentinel, biopsy, right obturator ONE BENIGN LYMPH NODE (0/1) 3. Lymph nodes, regional resection, left pelvic FOUR BENIGN LYMPH NODES (0/4)   01/02/2016 Surgery   Dr. Denman George performed robotic-assisted laparoscopic total hysterectomy with bilateral salpingoophorectomy, sentinel lymph node biopsy, lymphadenectomy     05/15/2016 Pathology Results   Vagina, biopsy, mid - ADENOCARCINOMA, SEE COMMENT. Microscopic Comment The morphology along with the patient's history are consistent with recurrent endometrioid adenocarcinoma. The carcinoma has a similar appearance to the primary ([id number]).   05/20/2016 Imaging   Ct scan abdomen showed solid 2.5 cm peritoneal mass in the mid to anterior left pelvis, suspicious for peritoneal metastasis. 2. Small expansile low-attenuation filling defect in the left external iliac vein, cannot exclude a small deep venous thrombus. Consider correlation with left lower extremity venous Doppler scan. 3. Small simple fluid density structure in the left pelvic sidewall abutting the left external iliac vessels, favor a small postoperative seroma. 4. No ascites. 5. No lymphadenopathy.  No metastatic disease in the chest. 6. Aortic atherosclerosis.   06/12/2016 PET scan   Intensely hypermetabolic 2.1 cm central pelvic peritoneal mass just to the left of midline, consistent with peritoneal metastatic recurrence. No ascites. 2. No additional hypermetabolic sites of metastatic disease. 3. Diffuse thyroid hypermetabolism without discrete thyroid nodule, favoring thyroiditis. Recommend correlation with serum thyroid function tests.   06/24/2016 - 07/22/2016 Chemotherapy   The patient had weekly cisplatin. She has missed several doses due to infection and pancytopenia   06/24/2016 - 09/04/2016 Radiation Therapy   She completed  concurrent radiation therapy Radiation treatment dates:  IMRT : 06/24/16 - 08/01/16 HDR : 08/13/16, 08/20/16, 08/27/16, 09/04/16  Site/dose:   Pelvis treated to 55 Gy in 25 fractions (simultaneous integrated boost technique) Vaginal Cuff treated to 24 Gy in 4 fractions   08/15/2016 - 12/03/2016 Chemotherapy   She received 6 cycles of carboplatin/Taxol   09/27/2016 Imaging   Interval decrease in size of previously described solid peritoneal nodule within the left anterior pelvis. Near complete resolution of previously described low-attenuation structure along the left pelvic sidewall. No evidence for metastatic disease in the chest. Aortic atherosclerosis.   12/30/2016 Imaging   Ct abdomen 1. Solitary left pelvic peritoneal implant is mildly decreased in size in the interval. 2. No new or progressive metastatic disease in the abdomen or pelvis. No ascites. 3. Aortic atherosclerosis.   04/02/2017 Imaging   Left lower quadrant peritoneal implant referenced on previous exam measures 1.7 x 1.3 cm, image 69 of series 2. Increased from 0.8 x 0.8 cm previously peer no new peritoneal implants identified.  Musculoskeletal: The degenerative disc disease noted within the lumbar spine.  IMPRESSION: 1. Solitary left pelvic peritoneal implant is increased in size in the interval. 2. No new sites of disease.  No ascites. 3. Aortic atherosclerosis   04/11/2017 PET scan   1. The left side of pelvis peritoneal implant has decreased in size and degree of FDG uptake compatible with response to therapy. No new areas of peritoneal disease identified. 2. Persistent diffuse increased uptake within the thyroid gland. Correlation with patient's thyroid function may be helpful.   05/26/2017 Imaging   1. Enlarging tumor implant along the left adnexa, currently 2.6 by 2.4 cm and previously 1.9 by 1.3 cm. No new tumor implant or other specific cause for the patient's pelvic symptoms is currently identified. 2.   Aortic Atherosclerosis (ICD10-I70.0). 3. Lumbar spondylosis and degenerative disc disease causing multilevel impingement.   06/04/2017 - 08/28/2017 Chemotherapy   The patient started letrozole and everolimus   08/26/2017 Imaging   Increased size of mass in the left adnexal region.  Several new small liver metastases in right hepatic lobe   09/08/2017 Imaging   LV EF: 60% -  65%   09/10/2017 Procedure   Technically successful right IJ power-injectable port catheter placement. Ready for routine use   12/04/2017 Imaging   1. Interval increase in size and number of multiple lesions within the liver compatible with hepatic metastatic disease. 2. Interval increase in size mass within the left hemipelvis.   12/15/2017 -  Chemotherapy   The patient had pembrolizumab (KEYTRUDA) 200 mg in sodium chloride 0.9 % 50 mL chemo infusion, 200 mg, Intravenous, Once, 1 of 6 cycles Administration: 200 mg (12/15/2017)  for chemotherapy treatment.    12/21/2017 Imaging   1. Moderate left hydronephrosis and hydroureter, similar compared to most recent CT from May 2019; obstruction appears to be secondary to a left pelvic mass/metastatic focus which has increased in size since the prior CT. 2. Numerous hepatic metastatic lesions, suspect that some may be increased in size but difficult to further characterize without intravenous contrast. Increased size of left pelvic soft tissue mass/metastatic lesion. Mass abuts and possibly invades the sigmoid colon.    02/20/2018 Imaging   Interval decrease in hepatic metastases.  Decreased mass or lymphadenopathy in the left external iliac chain. Interval resolution of left hydroureteronephrosis.  No new or progressive disease within the abdomen or pelvis.   05/16/2018 Imaging   05/16/2018 CT Abdomen IMPRESSION: 1. Slight interval decrease in size of hepatic metastatic  disease. 2. Slight interval decrease in size centrally necrotic mass within the left external iliac  region.   09/18/2018 Imaging   1. Today's study demonstrates a mixed response to therapy. Specifically, while the previously noted hepatic metastases appear decreased, the soft tissue mass along the left pelvic sidewall appears slightly larger than the prior study. No new metastatic disease is noted elsewhere in the abdomen or pelvis. 2. Aortic atherosclerosis. 3. Mild cardiomegaly. 4. Additional incidental findings, as above    03/12/2019 Imaging   1. Overall improvement with reduced size of the hepatic metastatic lesions, and reduced size of the irregular soft tissue mass in the left pelvis. 2. Other imaging findings of potential clinical significance: Mild distal esophageal wall thickening, cannot exclude esophagitis. Aortic Atherosclerosis (ICD10-I70.0). Lumbar impingement at L5-S1.       REVIEW OF SYSTEMS:   Constitutional: Denies fevers, chills or abnormal weight loss Eyes: Denies blurriness of vision Ears, nose, mouth, throat, and face: Denies mucositis or sore throat Respiratory: Denies cough, dyspnea or wheezes Cardiovascular: Denies palpitation, chest discomfort or lower extremity swelling Gastrointestinal:  Denies nausea, heartburn or change in bowel habits Skin: Denies abnormal skin rashes Lymphatics: Denies new lymphadenopathy or easy bruising Neurological:Denies numbness, tingling or new weaknesses Behavioral/Psych: Mood is stable, no new changes  All other systems were reviewed with the patient and are negative.  I have reviewed the past medical history, past surgical history, social history and family history with the patient and they are unchanged from previous note.  ALLERGIES:  is allergic to latex.  MEDICATIONS:  Current Outpatient Medications  Medication Sig Dispense Refill  . acetaminophen (TYLENOL) 325 MG tablet Take 650 mg by mouth every 6 (six) hours as needed for mild pain or moderate pain.     Marland Kitchen amoxicillin (AMOXIL) 500 MG capsule Take 2,000 mg by mouth  See admin instructions. Prior to dental appointment, takes 4 tables prior to appt    . Biotin w/ Vitamins C & E (HAIR/SKIN/NAILS PO) Take 1 tablet by mouth daily.    . Calcium Carb-Cholecalciferol (CALCIUM 600 + D PO) Take 2 tablets by mouth daily.    . fluticasone (FLONASE) 50 MCG/ACT nasal spray Place 1 spray into both nostrils 2 (two) times daily as needed for allergies.   1  . gabapentin (NEURONTIN) 600 MG tablet TAKE 1 TABLET BY MOUTH TWICE A DAY (Patient taking differently: Take 1 tablet 2 times a day) 60 tablet 9  . gabapentin (NEURONTIN) 600 MG tablet TAKE 1 TABLET BY MOUTH TWICE A DAY FOR RESTLESS LEGS AND NEUROPATHY.    Marland Kitchen HYDROcodone-acetaminophen (NORCO) 10-325 MG tablet Take 1 tablet by mouth every 6 (six) hours as needed. 30 tablet 0  . ibuprofen (ADVIL,MOTRIN) 200 MG tablet Take 400 mg by mouth every 8 (eight) hours as needed for mild pain.    Marland Kitchen levothyroxine (SYNTHROID) 200 MCG tablet Take 1 tablet (200 mcg total) by mouth daily before breakfast. 90 tablet 11  . lidocaine-prilocaine (EMLA) cream APPLY TO AFFECTED AREA ONCE 30 g 3  . loratadine (CLARITIN) 10 MG tablet Take 10 mg by mouth daily.    Marland Kitchen LORazepam (ATIVAN) 1 MG tablet Take 1 tablet (1 mg total) by mouth every 8 (eight) hours as needed for anxiety or sleep. 90 tablet 3  . losartan-hydrochlorothiazide (HYZAAR) 100-12.5 MG tablet Take 0.5 tablets by mouth daily. 60 tablet 9  . Magnesium 250 MG TABS Take 250 mg by mouth daily.    . magnesium oxide (MAG-OX) 400 MG tablet  Take 1 tablet by mouth 2 (two) times daily.    . Melatonin 10 MG CAPS Take 10 mg by mouth at bedtime.    . niacinamide 500 MG tablet Take 500 mg by mouth 3 (three) times daily with meals.    . ondansetron (ZOFRAN) 8 MG tablet Take 1 tablet (8 mg total) by mouth every 8 (eight) hours as needed for nausea. 60 tablet 1  . potassium gluconate 595 (99 K) MG TABS tablet Take by mouth.    . pramipexole (MIRAPEX) 0.5 MG tablet Take 1 mg by mouth at bedtime.    .  traZODone (DESYREL) 50 MG tablet Take 1.5 tablets (75 mg total) by mouth at bedtime. (Patient taking differently: Take 50 mg by mouth at bedtime. ) 135 tablet 1   No current facility-administered medications for this visit.    PHYSICAL EXAMINATION: ECOG PERFORMANCE STATUS: 1 - Symptomatic but completely ambulatory  Vitals:   07/20/19 1150  BP: 136/84  Pulse: 76  Temp: 98.5 F (36.9 C)  SpO2: 98%   Filed Weights   07/20/19 1150  Weight: 214 lb (97.1 kg)    GENERAL:alert, no distress and comfortable SKIN: skin color, texture, turgor are normal, no rashes or significant lesions EYES: normal, Conjunctiva are pink and non-injected, sclera clear OROPHARYNX:no exudate, no erythema and lips, buccal mucosa, and tongue normal  NECK: supple, thyroid normal size, non-tender, without nodularity LYMPH:  no palpable lymphadenopathy in the cervical, axillary or inguinal LUNGS: clear to auscultation and percussion with normal breathing effort HEART: regular rate & rhythm and no murmurs and no lower extremity edema ABDOMEN:abdomen soft, non-tender and normal bowel sounds Musculoskeletal:no cyanosis of digits and no clubbing  NEURO: alert & oriented x 3 with fluent speech, no focal motor/sensory deficits  LABORATORY DATA:  I have reviewed the data as listed    Component Value Date/Time   NA 140 07/20/2019 1100   NA 141 07/09/2017 1312   K 3.8 07/20/2019 1100   K 3.9 07/09/2017 1312   CL 104 07/20/2019 1100   CO2 24 07/20/2019 1100   CO2 27 07/09/2017 1312   GLUCOSE 92 07/20/2019 1100   GLUCOSE 109 07/09/2017 1312   BUN 24 (H) 07/20/2019 1100   BUN 16.4 07/09/2017 1312   CREATININE 0.78 07/20/2019 1100   CREATININE 0.91 01/04/2019 1351   CREATININE 0.8 07/09/2017 1312   CALCIUM 8.8 (L) 07/20/2019 1100   CALCIUM 9.2 07/09/2017 1312   PROT 6.9 07/20/2019 1100   PROT 7.1 07/09/2017 1312   ALBUMIN 3.6 07/20/2019 1100   ALBUMIN 3.1 (L) 07/09/2017 1312   AST 15 07/20/2019 1100   AST  19 01/04/2019 1351   AST 35 (H) 07/09/2017 1312   ALT 13 07/20/2019 1100   ALT 20 01/04/2019 1351   ALT 43 07/09/2017 1312   ALKPHOS 123 07/20/2019 1100   ALKPHOS 182 (H) 07/09/2017 1312   BILITOT 0.4 07/20/2019 1100   BILITOT 0.3 01/04/2019 1351   BILITOT 0.34 07/09/2017 1312   GFRNONAA >60 07/20/2019 1100   GFRNONAA >60 01/04/2019 1351   GFRAA >60 07/20/2019 1100   GFRAA >60 01/04/2019 1351    No results found for: SPEP, UPEP  Lab Results  Component Value Date   WBC 7.1 07/20/2019   NEUTROABS 4.7 07/20/2019   HGB 12.0 07/20/2019   HCT 35.9 (L) 07/20/2019   MCV 83.5 07/20/2019   PLT 183 07/20/2019      Chemistry      Component Value Date/Time   NA  140 07/20/2019 1100   NA 141 07/09/2017 1312   K 3.8 07/20/2019 1100   K 3.9 07/09/2017 1312   CL 104 07/20/2019 1100   CO2 24 07/20/2019 1100   CO2 27 07/09/2017 1312   BUN 24 (H) 07/20/2019 1100   BUN 16.4 07/09/2017 1312   CREATININE 0.78 07/20/2019 1100   CREATININE 0.91 01/04/2019 1351   CREATININE 0.8 07/09/2017 1312      Component Value Date/Time   CALCIUM 8.8 (L) 07/20/2019 1100   CALCIUM 9.2 07/09/2017 1312   ALKPHOS 123 07/20/2019 1100   ALKPHOS 182 (H) 07/09/2017 1312   AST 15 07/20/2019 1100   AST 19 01/04/2019 1351   AST 35 (H) 07/09/2017 1312   ALT 13 07/20/2019 1100   ALT 20 01/04/2019 1351   ALT 43 07/09/2017 1312   BILITOT 0.4 07/20/2019 1100   BILITOT 0.3 01/04/2019 1351   BILITOT 0.34 07/09/2017 1312

## 2019-07-21 NOTE — Assessment & Plan Note (Signed)
She has chronic restless leg Repeat iron studies are adequate She will continue her medication as directed

## 2019-07-21 NOTE — Assessment & Plan Note (Signed)
Overall, she is not symptomatic except for one episode of abdominal pain that has subsequently resolved Her last imaging study in September was positive response to therapy Plan to repeat imaging study in March 2021 unless she have new symptoms She will continue treatment every 3 weeks

## 2019-08-10 ENCOUNTER — Other Ambulatory Visit: Payer: Self-pay

## 2019-08-10 ENCOUNTER — Inpatient Hospital Stay: Payer: Medicare Other

## 2019-08-10 ENCOUNTER — Inpatient Hospital Stay: Payer: Medicare Other | Attending: Hematology and Oncology

## 2019-08-10 VITALS — BP 138/79 | HR 75 | Temp 98.3°F | Resp 17

## 2019-08-10 DIAGNOSIS — E039 Hypothyroidism, unspecified: Secondary | ICD-10-CM | POA: Insufficient documentation

## 2019-08-10 DIAGNOSIS — R109 Unspecified abdominal pain: Secondary | ICD-10-CM | POA: Diagnosis not present

## 2019-08-10 DIAGNOSIS — C541 Malignant neoplasm of endometrium: Secondary | ICD-10-CM

## 2019-08-10 DIAGNOSIS — G2581 Restless legs syndrome: Secondary | ICD-10-CM | POA: Insufficient documentation

## 2019-08-10 DIAGNOSIS — Z90710 Acquired absence of both cervix and uterus: Secondary | ICD-10-CM | POA: Insufficient documentation

## 2019-08-10 DIAGNOSIS — Z9079 Acquired absence of other genital organ(s): Secondary | ICD-10-CM | POA: Insufficient documentation

## 2019-08-10 DIAGNOSIS — Z5112 Encounter for antineoplastic immunotherapy: Secondary | ICD-10-CM | POA: Insufficient documentation

## 2019-08-10 DIAGNOSIS — C787 Secondary malignant neoplasm of liver and intrahepatic bile duct: Secondary | ICD-10-CM | POA: Diagnosis not present

## 2019-08-10 DIAGNOSIS — Z90722 Acquired absence of ovaries, bilateral: Secondary | ICD-10-CM | POA: Diagnosis not present

## 2019-08-10 DIAGNOSIS — Z79899 Other long term (current) drug therapy: Secondary | ICD-10-CM | POA: Insufficient documentation

## 2019-08-10 LAB — CBC WITH DIFFERENTIAL/PLATELET
Abs Immature Granulocytes: 0.04 10*3/uL (ref 0.00–0.07)
Basophils Absolute: 0 10*3/uL (ref 0.0–0.1)
Basophils Relative: 1 %
Eosinophils Absolute: 0.2 10*3/uL (ref 0.0–0.5)
Eosinophils Relative: 2 %
HCT: 36.4 % (ref 36.0–46.0)
Hemoglobin: 12 g/dL (ref 12.0–15.0)
Immature Granulocytes: 1 %
Lymphocytes Relative: 23 %
Lymphs Abs: 1.8 10*3/uL (ref 0.7–4.0)
MCH: 27.3 pg (ref 26.0–34.0)
MCHC: 33 g/dL (ref 30.0–36.0)
MCV: 82.7 fL (ref 80.0–100.0)
Monocytes Absolute: 0.6 10*3/uL (ref 0.1–1.0)
Monocytes Relative: 7 %
Neutro Abs: 5.3 10*3/uL (ref 1.7–7.7)
Neutrophils Relative %: 66 %
Platelets: 188 10*3/uL (ref 150–400)
RBC: 4.4 MIL/uL (ref 3.87–5.11)
RDW: 13.3 % (ref 11.5–15.5)
WBC: 7.9 10*3/uL (ref 4.0–10.5)
nRBC: 0 % (ref 0.0–0.2)

## 2019-08-10 LAB — COMPREHENSIVE METABOLIC PANEL
ALT: 11 U/L (ref 0–44)
AST: 13 U/L — ABNORMAL LOW (ref 15–41)
Albumin: 3.6 g/dL (ref 3.5–5.0)
Alkaline Phosphatase: 122 U/L (ref 38–126)
Anion gap: 10 (ref 5–15)
BUN: 19 mg/dL (ref 8–23)
CO2: 26 mmol/L (ref 22–32)
Calcium: 9.2 mg/dL (ref 8.9–10.3)
Chloride: 103 mmol/L (ref 98–111)
Creatinine, Ser: 0.78 mg/dL (ref 0.44–1.00)
GFR calc Af Amer: >60 mL/min (ref >60)
GFR calc non Af Amer: >60 mL/min (ref >60)
Glucose, Bld: 89 mg/dL (ref 70–99)
Potassium: 3.6 mmol/L (ref 3.5–5.1)
Sodium: 139 mmol/L (ref 135–145)
Total Bilirubin: 0.5 mg/dL (ref 0.3–1.2)
Total Protein: 6.7 g/dL (ref 6.5–8.1)

## 2019-08-10 LAB — TSH: TSH: 1.793 u[IU]/mL (ref 0.308–3.960)

## 2019-08-10 MED ORDER — SODIUM CHLORIDE 0.9 % IV SOLN
200.0000 mg | Freq: Once | INTRAVENOUS | Status: AC
  Administered 2019-08-10: 16:00:00 200 mg via INTRAVENOUS
  Filled 2019-08-10: qty 8

## 2019-08-10 MED ORDER — HEPARIN SOD (PORK) LOCK FLUSH 100 UNIT/ML IV SOLN
500.0000 [IU] | Freq: Once | INTRAVENOUS | Status: AC | PRN
  Administered 2019-08-10: 17:00:00 500 [IU]
  Filled 2019-08-10: qty 5

## 2019-08-10 MED ORDER — SODIUM CHLORIDE 0.9% FLUSH
10.0000 mL | Freq: Once | INTRAVENOUS | Status: AC
  Administered 2019-08-10: 15:00:00 10 mL
  Filled 2019-08-10: qty 10

## 2019-08-10 MED ORDER — SODIUM CHLORIDE 0.9% FLUSH
10.0000 mL | INTRAVENOUS | Status: DC | PRN
  Administered 2019-08-10: 17:00:00 10 mL
  Filled 2019-08-10: qty 10

## 2019-08-10 MED ORDER — SODIUM CHLORIDE 0.9 % IV SOLN
Freq: Once | INTRAVENOUS | Status: AC
  Filled 2019-08-10: qty 250

## 2019-08-10 NOTE — Patient Instructions (Signed)
[facility] Discharge Instructions for Patients Receiving Chemotherapy  Today you received the following chemotherapy agents Pembrolizumab (KEYTRUDA).  To help prevent nausea and vomiting after your treatment, we encourage you to take your nausea medication as prescribed.   If you develop nausea and vomiting that is not controlled by your nausea medication, call the clinic.   BELOW ARE SYMPTOMS THAT SHOULD BE REPORTED IMMEDIATELY:  *FEVER GREATER THAN 100.5 F  *CHILLS WITH OR WITHOUT FEVER  NAUSEA AND VOMITING THAT IS NOT CONTROLLED WITH YOUR NAUSEA MEDICATION  *UNUSUAL SHORTNESS OF BREATH  *UNUSUAL BRUISING OR BLEEDING  TENDERNESS IN MOUTH AND THROAT WITH OR WITHOUT PRESENCE OF ULCERS  *URINARY PROBLEMS  *BOWEL PROBLEMS  UNUSUAL RASH Items with * indicate a potential emergency and should be followed up as soon as possible.  Feel free to call the clinic should you have any questions or concerns. The clinic phone number is [phone].  Please show the CHEMO ALERT CARD at check-in to the Emergency Department and triage nurse.   

## 2019-08-10 NOTE — Patient Instructions (Signed)

## 2019-08-31 ENCOUNTER — Inpatient Hospital Stay (HOSPITAL_BASED_OUTPATIENT_CLINIC_OR_DEPARTMENT_OTHER): Payer: Medicare Other | Admitting: Hematology and Oncology

## 2019-08-31 ENCOUNTER — Inpatient Hospital Stay: Payer: Medicare Other

## 2019-08-31 ENCOUNTER — Encounter: Payer: Self-pay | Admitting: Hematology and Oncology

## 2019-08-31 ENCOUNTER — Telehealth: Payer: Self-pay | Admitting: Hematology and Oncology

## 2019-08-31 ENCOUNTER — Other Ambulatory Visit: Payer: Self-pay

## 2019-08-31 VITALS — BP 141/79 | HR 80 | Temp 98.7°F | Resp 18 | Ht 65.5 in | Wt 214.6 lb

## 2019-08-31 DIAGNOSIS — Z5112 Encounter for antineoplastic immunotherapy: Secondary | ICD-10-CM | POA: Diagnosis not present

## 2019-08-31 DIAGNOSIS — R109 Unspecified abdominal pain: Secondary | ICD-10-CM

## 2019-08-31 DIAGNOSIS — E039 Hypothyroidism, unspecified: Secondary | ICD-10-CM

## 2019-08-31 DIAGNOSIS — C787 Secondary malignant neoplasm of liver and intrahepatic bile duct: Secondary | ICD-10-CM

## 2019-08-31 DIAGNOSIS — C541 Malignant neoplasm of endometrium: Secondary | ICD-10-CM

## 2019-08-31 LAB — COMPREHENSIVE METABOLIC PANEL
ALT: 12 U/L (ref 0–44)
AST: 14 U/L — ABNORMAL LOW (ref 15–41)
Albumin: 3.5 g/dL (ref 3.5–5.0)
Alkaline Phosphatase: 109 U/L (ref 38–126)
Anion gap: 8 (ref 5–15)
BUN: 17 mg/dL (ref 8–23)
CO2: 28 mmol/L (ref 22–32)
Calcium: 9.1 mg/dL (ref 8.9–10.3)
Chloride: 108 mmol/L (ref 98–111)
Creatinine, Ser: 0.78 mg/dL (ref 0.44–1.00)
GFR calc Af Amer: >60 mL/min (ref >60)
GFR calc non Af Amer: >60 mL/min (ref >60)
Glucose, Bld: 111 mg/dL — ABNORMAL HIGH (ref 70–99)
Potassium: 3.8 mmol/L (ref 3.5–5.1)
Sodium: 144 mmol/L (ref 135–145)
Total Bilirubin: 0.5 mg/dL (ref 0.3–1.2)
Total Protein: 6.5 g/dL (ref 6.5–8.1)

## 2019-08-31 LAB — CBC WITH DIFFERENTIAL/PLATELET
Abs Immature Granulocytes: 0.02 10*3/uL (ref 0.00–0.07)
Basophils Absolute: 0.1 10*3/uL (ref 0.0–0.1)
Basophils Relative: 1 %
Eosinophils Absolute: 0.2 10*3/uL (ref 0.0–0.5)
Eosinophils Relative: 3 %
HCT: 35.4 % — ABNORMAL LOW (ref 36.0–46.0)
Hemoglobin: 11.8 g/dL — ABNORMAL LOW (ref 12.0–15.0)
Immature Granulocytes: 0 %
Lymphocytes Relative: 25 %
Lymphs Abs: 1.4 10*3/uL (ref 0.7–4.0)
MCH: 27.8 pg (ref 26.0–34.0)
MCHC: 33.3 g/dL (ref 30.0–36.0)
MCV: 83.3 fL (ref 80.0–100.0)
Monocytes Absolute: 0.4 10*3/uL (ref 0.1–1.0)
Monocytes Relative: 8 %
Neutro Abs: 3.5 10*3/uL (ref 1.7–7.7)
Neutrophils Relative %: 63 %
Platelets: 184 10*3/uL (ref 150–400)
RBC: 4.25 MIL/uL (ref 3.87–5.11)
RDW: 13.6 % (ref 11.5–15.5)
WBC: 5.5 10*3/uL (ref 4.0–10.5)
nRBC: 0 % (ref 0.0–0.2)

## 2019-08-31 LAB — TSH: TSH: 3.539 u[IU]/mL (ref 0.308–3.960)

## 2019-08-31 MED ORDER — HEPARIN SOD (PORK) LOCK FLUSH 100 UNIT/ML IV SOLN
500.0000 [IU] | Freq: Once | INTRAVENOUS | Status: AC | PRN
  Administered 2019-08-31: 500 [IU]
  Filled 2019-08-31: qty 5

## 2019-08-31 MED ORDER — HYDROCODONE-ACETAMINOPHEN 10-325 MG PO TABS: 1.0000 | ORAL_TABLET | Freq: Four times a day (QID) | ORAL | 0 refills | Status: DC | PRN

## 2019-08-31 MED ORDER — SODIUM CHLORIDE 0.9 % IV SOLN
200.0000 mg | Freq: Once | INTRAVENOUS | Status: AC
  Administered 2019-08-31: 200 mg via INTRAVENOUS
  Filled 2019-08-31: qty 8

## 2019-08-31 MED ORDER — SODIUM CHLORIDE 0.9% FLUSH
10.0000 mL | INTRAVENOUS | Status: DC | PRN
  Administered 2019-08-31: 12:00:00 10 mL
  Filled 2019-08-31: qty 10

## 2019-08-31 MED ORDER — SODIUM CHLORIDE 0.9 % IV SOLN
Freq: Once | INTRAVENOUS | Status: AC
  Filled 2019-08-31: qty 250

## 2019-08-31 MED ORDER — SODIUM CHLORIDE 0.9% FLUSH
10.0000 mL | Freq: Once | INTRAVENOUS | Status: AC
  Administered 2019-08-31: 10:00:00 10 mL
  Filled 2019-08-31: qty 10

## 2019-08-31 NOTE — Patient Instructions (Signed)

## 2019-08-31 NOTE — Telephone Encounter (Signed)
Scheduled 3/5 appt per 2/23 sch msg. Pt is aware of new appt date and time.

## 2019-08-31 NOTE — Assessment & Plan Note (Signed)
She has new onset of abdominal pain of unknown etiology Given her high risk situation, I plan to order CT imaging next week for assessment In the meantime, I recommend her to take pain medicine as needed I refill her prescription pain medicine I advised her to watch out for signs of constipation, sedation and nausea

## 2019-08-31 NOTE — Patient Instructions (Signed)
[facility] Discharge Instructions for Patients Receiving Chemotherapy  Today you received the following chemotherapy agents Pembrolizumab (KEYTRUDA).  To help prevent nausea and vomiting after your treatment, we encourage you to take your nausea medication as prescribed.   If you develop nausea and vomiting that is not controlled by your nausea medication, call the clinic.   BELOW ARE SYMPTOMS THAT SHOULD BE REPORTED IMMEDIATELY:  *FEVER GREATER THAN 100.5 F  *CHILLS WITH OR WITHOUT FEVER  NAUSEA AND VOMITING THAT IS NOT CONTROLLED WITH YOUR NAUSEA MEDICATION  *UNUSUAL SHORTNESS OF BREATH  *UNUSUAL BRUISING OR BLEEDING  TENDERNESS IN MOUTH AND THROAT WITH OR WITHOUT PRESENCE OF ULCERS  *URINARY PROBLEMS  *BOWEL PROBLEMS  UNUSUAL RASH Items with * indicate a potential emergency and should be followed up as soon as possible.  Feel free to call the clinic should you have any questions or concerns. The clinic phone number is [phone].  Please show the CHEMO ALERT CARD at check-in to the Emergency Department and triage nurse.   

## 2019-08-31 NOTE — Progress Notes (Signed)
[place] OFFICE PROGRESS NOTE  Patient Care Team: [name], MD as PCP - General (Family Medicine)  ASSESSMENT & PLAN:  Recurrent carcinoma of endometrium [facility]) She has new onset of back pain on the left flank area that is new since a week and a half ago Her last imaging study in September was positive response to therapy She will continue treatment today I plan to repeat imaging study next week for follow-up  Acquired hypothyroidism Her recent TSH is satisfactory She will continue chronic thyroid replacement therapy We will adjust her thyroid medicine as needed  Abdominal pain She has new onset of abdominal pain of unknown etiology Given her high risk situation, I plan to order CT imaging next week for assessment In the meantime, I recommend her to take pain medicine as needed I refill her prescription pain medicine I advised her to watch out for signs of constipation, sedation and nausea   Orders Placed This Encounter  Procedures  . CT ABDOMEN PELVIS W CONTRAST    Standing Status:   Future    Standing Expiration Date:   08/30/2020    Order Specific Question:   If indicated for the ordered procedure, I authorize the administration of contrast media per Radiology protocol    Answer:   Yes    Order Specific Question:   Preferred imaging location?    Answer:   [facility]    Order Specific Question:   Radiology Contrast Protocol - do NOT remove file path    Answer:   [email or web address]    All questions were answered. The patient knows to call the clinic with any problems, questions or concerns. The total time spent in the appointment was 20 minutes encounter with patients including review of chart and various tests results, discussions about plan of care and coordination of care plan   Heath Lark, MD 08/31/2019 10:43 AM  INTERVAL HISTORY: Please see below for problem oriented charting. She returns for treatment and  follow-up She complained of new onset of left flank pain for the last week to week and a half She has been using heat pad and taking hydrocodone as needed She denies nausea, bloating or recent changes in bowel habits She continues to have occasional restless leg syndrome No recent infusion reaction  SUMMARY OF ONCOLOGIC HISTORY: Oncology History Overview Note  MSI stable on June 2017 tissue but high from [facility] study from November 2017  05/15/16: ER is moderately positive (70%). PR is strongly positive (80%).   Recurrent carcinoma of endometrium ([place])  01/02/2016 Pathology Results   Uterus +/- tubes/ovaries, neoplastic, cervix ENDOMETRIAL ADENOCARCINOMA, FIGO GRADE 2 (4.7 CM) THE TUMOR INVADES LESS THAN ONE-HALF OF THE MYOMETRIUM (PT1A) ALL MARGINS OF RESECTION ARE NEGATIVE FOR CARCINOMA LEIOMYOMAS AND ADENOMYOSIS BILATERAL FALLOPIAN TUBES AND OVARIES: HISTOLOGICAL UNREMARKABLE 2. Lymph node, sentinel, biopsy, right obturator ONE BENIGN LYMPH NODE (0/1) 3. Lymph nodes, regional resection, left pelvic FOUR BENIGN LYMPH NODES (0/4)   01/02/2016 Surgery   Dr. Denman George performed robotic-assisted laparoscopic total hysterectomy with bilateral salpingoophorectomy, sentinel lymph node biopsy, lymphadenectomy     05/15/2016 Pathology Results   Vagina, biopsy, mid - ADENOCARCINOMA, SEE COMMENT. Microscopic Comment The morphology along with the patient's history are consistent with recurrent endometrioid adenocarcinoma. The carcinoma has a similar appearance to the primary ([id number]).   05/20/2016 Imaging   Ct scan abdomen showed solid 2.5 cm peritoneal mass in the mid to anterior left pelvis, suspicious for peritoneal metastasis. 2. Small expansile  low-attenuation filling defect in the left external iliac vein, cannot exclude a small deep venous thrombus. Consider correlation with left lower extremity venous Doppler scan. 3. Small simple fluid density structure in the left pelvic  sidewall abutting the left external iliac vessels, favor a small postoperative seroma. 4. No ascites. 5. No lymphadenopathy.  No metastatic disease in the chest. 6. Aortic atherosclerosis.   06/12/2016 PET scan   Intensely hypermetabolic 2.1 cm central pelvic peritoneal mass just to the left of midline, consistent with peritoneal metastatic recurrence. No ascites. 2. No additional hypermetabolic sites of metastatic disease. 3. Diffuse thyroid hypermetabolism without discrete thyroid nodule, favoring thyroiditis. Recommend correlation with serum thyroid function tests.   06/24/2016 - 07/22/2016 Chemotherapy   The patient had weekly cisplatin. She has missed several doses due to infection and pancytopenia   06/24/2016 - 09/04/2016 Radiation Therapy   She completed concurrent radiation therapy Radiation treatment dates:   IMRT : 06/24/16 - 08/01/16 HDR : 08/13/16, 08/20/16, 08/27/16, 09/04/16  Site/dose:   Pelvis treated to 55 Gy in 25 fractions (simultaneous integrated boost technique) Vaginal Cuff treated to 24 Gy in 4 fractions   08/15/2016 - 12/03/2016 Chemotherapy   She received 6 cycles of carboplatin/Taxol   09/27/2016 Imaging   Interval decrease in size of previously described solid peritoneal nodule within the left anterior pelvis. Near complete resolution of previously described low-attenuation structure along the left pelvic sidewall. No evidence for metastatic disease in the chest. Aortic atherosclerosis.   12/30/2016 Imaging   Ct abdomen 1. Solitary left pelvic peritoneal implant is mildly decreased in size in the interval. 2. No new or progressive metastatic disease in the abdomen or pelvis. No ascites. 3. Aortic atherosclerosis.   04/02/2017 Imaging   Left lower quadrant peritoneal implant referenced on previous exam measures 1.7 x 1.3 cm, image 69 of series 2. Increased from 0.8 x 0.8 cm previously peer no new peritoneal implants identified.  Musculoskeletal: The degenerative disc  disease noted within the lumbar spine.  IMPRESSION: 1. Solitary left pelvic peritoneal implant is increased in size in the interval. 2. No new sites of disease.  No ascites. 3. Aortic atherosclerosis   04/11/2017 PET scan   1. The left side of pelvis peritoneal implant has decreased in size and degree of FDG uptake compatible with response to therapy. No new areas of peritoneal disease identified. 2. Persistent diffuse increased uptake within the thyroid gland. Correlation with patient's thyroid function may be helpful.   05/26/2017 Imaging   1. Enlarging tumor implant along the left adnexa, currently 2.6 by 2.4 cm and previously 1.9 by 1.3 cm. No new tumor implant or other specific cause for the patient's pelvic symptoms is currently identified. 2.  Aortic Atherosclerosis (ICD10-I70.0). 3. Lumbar spondylosis and degenerative disc disease causing multilevel impingement.   06/04/2017 - 08/28/2017 Chemotherapy   The patient started letrozole and everolimus   08/26/2017 Imaging   Increased size of mass in the left adnexal region.  Several new small liver metastases in right hepatic lobe   09/08/2017 Imaging   LV EF: 60% -  65%   09/10/2017 Procedure   Technically successful right IJ power-injectable port catheter placement. Ready for routine use   12/04/2017 Imaging   1. Interval increase in size and number of multiple lesions within the liver compatible with hepatic metastatic disease. 2. Interval increase in size mass within the left hemipelvis.   12/15/2017 -  Chemotherapy   The patient had pembrolizumab (KEYTRUDA) 200 mg in sodium chloride 0.9 %  50 mL chemo infusion, 200 mg, Intravenous, Once, 1 of 6 cycles Administration: 200 mg (12/15/2017)  for chemotherapy treatment.    12/21/2017 Imaging   1. Moderate left hydronephrosis and hydroureter, similar compared to most recent CT from May 2019; obstruction appears to be secondary to a left pelvic mass/metastatic focus which has  increased in size since the prior CT. 2. Numerous hepatic metastatic lesions, suspect that some may be increased in size but difficult to further characterize without intravenous contrast. Increased size of left pelvic soft tissue mass/metastatic lesion. Mass abuts and possibly invades the sigmoid colon.    02/20/2018 Imaging   Interval decrease in hepatic metastases.  Decreased mass or lymphadenopathy in the left external iliac chain. Interval resolution of left hydroureteronephrosis.  No new or progressive disease within the abdomen or pelvis.   05/16/2018 Imaging   05/16/2018 CT Abdomen IMPRESSION: 1. Slight interval decrease in size of hepatic metastatic disease. 2. Slight interval decrease in size centrally necrotic mass within the left external iliac region.   09/18/2018 Imaging   1. Today's study demonstrates a mixed response to therapy. Specifically, while the previously noted hepatic metastases appear decreased, the soft tissue mass along the left pelvic sidewall appears slightly larger than the prior study. No new metastatic disease is noted elsewhere in the abdomen or pelvis. 2. Aortic atherosclerosis. 3. Mild cardiomegaly. 4. Additional incidental findings, as above    03/12/2019 Imaging   1. Overall improvement with reduced size of the hepatic metastatic lesions, and reduced size of the irregular soft tissue mass in the left pelvis. 2. Other imaging findings of potential clinical significance: Mild distal esophageal wall thickening, cannot exclude esophagitis. Aortic Atherosclerosis (ICD10-I70.0). Lumbar impingement at L5-S1.       REVIEW OF SYSTEMS:   Constitutional: Denies fevers, chills or abnormal weight loss Eyes: Denies blurriness of vision Ears, nose, mouth, throat, and face: Denies mucositis or sore throat Respiratory: Denies cough, dyspnea or wheezes Cardiovascular: Denies palpitation, chest discomfort or lower extremity swelling Gastrointestinal:  Denies  nausea, heartburn or change in bowel habits Skin: Denies abnormal skin rashes Lymphatics: Denies new lymphadenopathy or easy bruising Neurological:Denies numbness, tingling or new weaknesses Behavioral/Psych: Mood is stable, no new changes  All other systems were reviewed with the patient and are negative.  I have reviewed the past medical history, past surgical history, social history and family history with the patient and they are unchanged from previous note.  ALLERGIES:  is allergic to latex.  MEDICATIONS:  Current Outpatient Medications  Medication Sig Dispense Refill  . acetaminophen (TYLENOL) 325 MG tablet Take 650 mg by mouth every 6 (six) hours as needed for mild pain or moderate pain.     Marland Kitchen amoxicillin (AMOXIL) 500 MG capsule Take 2,000 mg by mouth See admin instructions. Prior to dental appointment, takes 4 tables prior to appt    . Biotin w/ Vitamins C & E (HAIR/SKIN/NAILS PO) Take 1 tablet by mouth daily.    . Calcium Carb-Cholecalciferol (CALCIUM 600 + D PO) Take 2 tablets by mouth daily.    . fluticasone (FLONASE) 50 MCG/ACT nasal spray Place 1 spray into both nostrils 2 (two) times daily as needed for allergies.   1  . gabapentin (NEURONTIN) 600 MG tablet TAKE 1 TABLET BY MOUTH TWICE A DAY (Patient taking differently: Take 1 tablet 2 times a day) 60 tablet 9  . gabapentin (NEURONTIN) 600 MG tablet TAKE 1 TABLET BY MOUTH TWICE A DAY FOR RESTLESS LEGS AND NEUROPATHY.    Marland Kitchen  HYDROcodone-acetaminophen (NORCO) 10-325 MG tablet Take 1 tablet by mouth every 6 (six) hours as needed. 30 tablet 0  . ibuprofen (ADVIL,MOTRIN) 200 MG tablet Take 400 mg by mouth every 8 (eight) hours as needed for mild pain.    Marland Kitchen levothyroxine (SYNTHROID) 200 MCG tablet Take 1 tablet (200 mcg total) by mouth daily before breakfast. 90 tablet 11  . lidocaine-prilocaine (EMLA) cream APPLY TO AFFECTED AREA ONCE 30 g 3  . loratadine (CLARITIN) 10 MG tablet Take 10 mg by mouth daily.    Marland Kitchen LORazepam (ATIVAN) 1  MG tablet Take 1 tablet (1 mg total) by mouth every 8 (eight) hours as needed for anxiety or sleep. 90 tablet 3  . losartan-hydrochlorothiazide (HYZAAR) 100-12.5 MG tablet Take 0.5 tablets by mouth daily. 60 tablet 9  . Magnesium 250 MG TABS Take 250 mg by mouth daily.    . magnesium oxide (MAG-OX) 400 MG tablet Take 1 tablet by mouth 2 (two) times daily.    . Melatonin 10 MG CAPS Take 10 mg by mouth at bedtime.    . niacinamide 500 MG tablet Take 500 mg by mouth 3 (three) times daily with meals.    . ondansetron (ZOFRAN) 8 MG tablet Take 1 tablet (8 mg total) by mouth every 8 (eight) hours as needed for nausea. 60 tablet 1  . potassium gluconate 595 (99 K) MG TABS tablet Take by mouth.    . pramipexole (MIRAPEX) 0.5 MG tablet Take 1 mg by mouth at bedtime.    . traZODone (DESYREL) 50 MG tablet Take 1.5 tablets (75 mg total) by mouth at bedtime. (Patient taking differently: Take 50 mg by mouth at bedtime. ) 135 tablet 1   No current facility-administered medications for this visit.    PHYSICAL EXAMINATION: ECOG PERFORMANCE STATUS: 1 - Symptomatic but completely ambulatory  Vitals:   08/31/19 1035  BP: (!) 141/79  Pulse: 80  Resp: 18  Temp: 98.7 F (37.1 C)  SpO2: 99%   Filed Weights   08/31/19 1035  Weight: 214 lb 9.6 oz (97.3 kg)    GENERAL:alert, no distress and comfortable SKIN: skin color, texture, turgor are normal, no rashes or significant lesions EYES: normal, Conjunctiva are pink and non-injected, sclera clear OROPHARYNX:no exudate, no erythema and lips, buccal mucosa, and tongue normal  NECK: supple, thyroid normal size, non-tender, without nodularity LYMPH:  no palpable lymphadenopathy in the cervical, axillary or inguinal LUNGS: clear to auscultation and percussion with normal breathing effort HEART: regular rate & rhythm and no murmurs and no lower extremity edema ABDOMEN:abdomen soft, non-tender and normal bowel sounds Musculoskeletal:no cyanosis of digits and no  clubbing  NEURO: alert & oriented x 3 with fluent speech, no focal motor/sensory deficits  LABORATORY DATA:  I have reviewed the data as listed    Component Value Date/Time   NA 139 08/10/2019 1440   NA 141 07/09/2017 1312   K 3.6 08/10/2019 1440   K 3.9 07/09/2017 1312   CL 103 08/10/2019 1440   CO2 26 08/10/2019 1440   CO2 27 07/09/2017 1312   GLUCOSE 89 08/10/2019 1440   GLUCOSE 109 07/09/2017 1312   BUN 19 08/10/2019 1440   BUN 16.4 07/09/2017 1312   CREATININE 0.78 08/10/2019 1440   CREATININE 0.91 01/04/2019 1351   CREATININE 0.8 07/09/2017 1312   CALCIUM 9.2 08/10/2019 1440   CALCIUM 9.2 07/09/2017 1312   PROT 6.7 08/10/2019 1440   PROT 7.1 07/09/2017 1312   ALBUMIN 3.6 08/10/2019 1440  ALBUMIN 3.1 (L) 07/09/2017 1312   AST 13 (L) 08/10/2019 1440   AST 19 01/04/2019 1351   AST 35 (H) 07/09/2017 1312   ALT 11 08/10/2019 1440   ALT 20 01/04/2019 1351   ALT 43 07/09/2017 1312   ALKPHOS 122 08/10/2019 1440   ALKPHOS 182 (H) 07/09/2017 1312   BILITOT 0.5 08/10/2019 1440   BILITOT 0.3 01/04/2019 1351   BILITOT 0.34 07/09/2017 1312   GFRNONAA >60 08/10/2019 1440   GFRNONAA >60 01/04/2019 1351   GFRAA >60 08/10/2019 1440   GFRAA >60 01/04/2019 1351    No results found for: SPEP, UPEP  Lab Results  Component Value Date   WBC 5.5 08/31/2019   NEUTROABS 3.5 08/31/2019   HGB 11.8 (L) 08/31/2019   HCT 35.4 (L) 08/31/2019   MCV 83.3 08/31/2019   PLT 184 08/31/2019      Chemistry      Component Value Date/Time   NA 139 08/10/2019 1440   NA 141 07/09/2017 1312   K 3.6 08/10/2019 1440   K 3.9 07/09/2017 1312   CL 103 08/10/2019 1440   CO2 26 08/10/2019 1440   CO2 27 07/09/2017 1312   BUN 19 08/10/2019 1440   BUN 16.4 07/09/2017 1312   CREATININE 0.78 08/10/2019 1440   CREATININE 0.91 01/04/2019 1351   CREATININE 0.8 07/09/2017 1312      Component Value Date/Time   CALCIUM 9.2 08/10/2019 1440   CALCIUM 9.2 07/09/2017 1312   ALKPHOS 122 08/10/2019 1440    ALKPHOS 182 (H) 07/09/2017 1312   AST 13 (L) 08/10/2019 1440   AST 19 01/04/2019 1351   AST 35 (H) 07/09/2017 1312   ALT 11 08/10/2019 1440   ALT 20 01/04/2019 1351   ALT 43 07/09/2017 1312   BILITOT 0.5 08/10/2019 1440   BILITOT 0.3 01/04/2019 1351   BILITOT 0.34 07/09/2017 1312

## 2019-08-31 NOTE — Assessment & Plan Note (Addendum)
She has new onset of back pain on the left flank area that is new since a week and a half ago Her last imaging study in September was positive response to therapy She will continue treatment today I plan to repeat imaging study next week for follow-up

## 2019-08-31 NOTE — Assessment & Plan Note (Signed)
Her recent TSH is satisfactory She will continue chronic thyroid replacement therapy We will adjust her thyroid medicine as needed

## 2019-09-01 ENCOUNTER — Telehealth: Payer: Self-pay

## 2019-09-01 NOTE — Telephone Encounter (Signed)
She saw primary MD today for complaint of left arm pain. He wants to start her on Medrol dose pack. She is asking if that is okay with Dr. Alvy Bimler?

## 2019-09-02 NOTE — Telephone Encounter (Signed)
She can take meloxicam Always take with food

## 2019-09-02 NOTE — Telephone Encounter (Signed)
Called and given below message. She verbalized understanding. She is not going to take the Medrol dose pack and will take her pain medication. She was instructed to take Meloxicam after she finished with Medrol dose pack for arthritis that showed up on x-ray. Should she take the Meloxicam?

## 2019-09-02 NOTE — Telephone Encounter (Signed)
Called and given below message. She verbalized understanding. 

## 2019-09-02 NOTE — Telephone Encounter (Signed)
It's not recommended She can get injections but not oral steroids due to treatment with [place] She can take the pain medications I prescribe

## 2019-09-09 ENCOUNTER — Ambulatory Visit (HOSPITAL_COMMUNITY)
Admission: RE | Admit: 2019-09-09 | Discharge: 2019-09-09 | Disposition: A | Discharge status: Home / Self Care | Payer: Medicare Other | Source: Ambulatory Visit | Attending: Hematology and Oncology | Admitting: Hematology and Oncology

## 2019-09-09 ENCOUNTER — Other Ambulatory Visit: Payer: Self-pay

## 2019-09-09 DIAGNOSIS — C541 Malignant neoplasm of endometrium: Secondary | ICD-10-CM

## 2019-09-09 MED ORDER — IOHEXOL 300 MG/ML  SOLN
100.0000 mL | Freq: Once | INTRAMUSCULAR | Status: AC | PRN
  Administered 2019-09-09: 100 mL via INTRAVENOUS

## 2019-09-09 MED ORDER — SODIUM CHLORIDE (PF) 0.9 % IJ SOLN
INTRAMUSCULAR | Status: AC
  Filled 2019-09-09: qty 50

## 2019-09-10 ENCOUNTER — Encounter: Payer: Self-pay | Admitting: Hematology and Oncology

## 2019-09-10 ENCOUNTER — Inpatient Hospital Stay: Payer: Medicare Other | Attending: Hematology and Oncology | Admitting: Hematology and Oncology

## 2019-09-10 ENCOUNTER — Telehealth: Payer: Self-pay | Admitting: Hematology and Oncology

## 2019-09-10 ENCOUNTER — Other Ambulatory Visit: Payer: Self-pay

## 2019-09-10 DIAGNOSIS — M549 Dorsalgia, unspecified: Secondary | ICD-10-CM | POA: Diagnosis not present

## 2019-09-10 DIAGNOSIS — R109 Unspecified abdominal pain: Secondary | ICD-10-CM | POA: Insufficient documentation

## 2019-09-10 DIAGNOSIS — R634 Abnormal weight loss: Secondary | ICD-10-CM | POA: Insufficient documentation

## 2019-09-10 DIAGNOSIS — Z9079 Acquired absence of other genital organ(s): Secondary | ICD-10-CM | POA: Diagnosis not present

## 2019-09-10 DIAGNOSIS — Z791 Long term (current) use of non-steroidal anti-inflammatories (NSAID): Secondary | ICD-10-CM | POA: Diagnosis not present

## 2019-09-10 DIAGNOSIS — Z923 Personal history of irradiation: Secondary | ICD-10-CM | POA: Diagnosis not present

## 2019-09-10 DIAGNOSIS — C541 Malignant neoplasm of endometrium: Secondary | ICD-10-CM | POA: Diagnosis present

## 2019-09-10 DIAGNOSIS — Z79899 Other long term (current) drug therapy: Secondary | ICD-10-CM | POA: Diagnosis not present

## 2019-09-10 DIAGNOSIS — Z5112 Encounter for antineoplastic immunotherapy: Secondary | ICD-10-CM | POA: Diagnosis not present

## 2019-09-10 DIAGNOSIS — Z90722 Acquired absence of ovaries, bilateral: Secondary | ICD-10-CM | POA: Diagnosis not present

## 2019-09-10 DIAGNOSIS — Z90710 Acquired absence of both cervix and uterus: Secondary | ICD-10-CM | POA: Diagnosis not present

## 2019-09-10 DIAGNOSIS — E663 Overweight: Secondary | ICD-10-CM | POA: Insufficient documentation

## 2019-09-10 DIAGNOSIS — C787 Secondary malignant neoplasm of liver and intrahepatic bile duct: Secondary | ICD-10-CM | POA: Insufficient documentation

## 2019-09-10 DIAGNOSIS — Z6835 Body mass index (BMI) 35.0-35.9, adult: Secondary | ICD-10-CM | POA: Insufficient documentation

## 2019-09-10 DIAGNOSIS — E669 Obesity, unspecified: Secondary | ICD-10-CM | POA: Diagnosis not present

## 2019-09-10 DIAGNOSIS — Z9221 Personal history of antineoplastic chemotherapy: Secondary | ICD-10-CM | POA: Insufficient documentation

## 2019-09-10 NOTE — Progress Notes (Signed)
[facility] OFFICE PROGRESS NOTE  Patient Care Team: [name], MD as PCP - General (Family Medicine)  ASSESSMENT & PLAN:  Recurrent carcinoma of endometrium [facility]) I have reviewed multiple imaging studies with the patient and her family She has no signs of cancer progression Overall, she tolerated treatment well I think her abdominal pain/back pain is related to her chronic degenerative arthritis and not necessarily from her disease We will continue pembrolizumab indefinitely Her next treatment is due on March 16 I will see her next month for further follow-up I do not plan to repeat imaging study again until September 2021  Metastasis to liver [facility]) The liver lesions are stable We will observe closely  Obesity, Class II, BMI 35-39.9 We discussed the link between obesity and cancer in the contribution from obesity causing degenerative changes to her back She is interested to lose weight We discussed some common strategies including drinking enough liquid per day, getting adequate sleep, weighing herself daily, avoiding late night snacking and tracking her oral intake and to gently introduce some activity as tolerated She appears motivated to lose some weight She will set a target goal of losing 2 pounds by the time I see her in April I encouraged the patient today   No orders of the defined types were placed in this encounter.   All questions were answered. The patient knows to call the clinic with any problems, questions or concerns. The total time spent in the appointment was 20 minutes encounter with patients including review of chart and various tests results, discussions about plan of care and coordination of care plan   Heath Lark, MD 09/10/2019 2:05 PM  INTERVAL HISTORY: Please see below for problem oriented charting. She returns with her family for further follow-up and review of imaging studies She denies pain recently She have occasional back pain  and discomfort No recent infusion reaction  SUMMARY OF ONCOLOGIC HISTORY: Oncology History Overview Note  MSI stable on June 2017 tissue but high from [facility] study from November 2017  05/15/16: ER is moderately positive (70%). PR is strongly positive (80%).   Recurrent carcinoma of endometrium ([place])  01/02/2016 Pathology Results   Uterus +/- tubes/ovaries, neoplastic, cervix ENDOMETRIAL ADENOCARCINOMA, FIGO GRADE 2 (4.7 CM) THE TUMOR INVADES LESS THAN ONE-HALF OF THE MYOMETRIUM (PT1A) ALL MARGINS OF RESECTION ARE NEGATIVE FOR CARCINOMA LEIOMYOMAS AND ADENOMYOSIS BILATERAL FALLOPIAN TUBES AND OVARIES: HISTOLOGICAL UNREMARKABLE 2. Lymph node, sentinel, biopsy, right obturator ONE BENIGN LYMPH NODE (0/1) 3. Lymph nodes, regional resection, left pelvic FOUR BENIGN LYMPH NODES (0/4)   01/02/2016 Surgery   Dr. Denman George performed robotic-assisted laparoscopic total hysterectomy with bilateral salpingoophorectomy, sentinel lymph node biopsy, lymphadenectomy     05/15/2016 Pathology Results   Vagina, biopsy, mid - ADENOCARCINOMA, SEE COMMENT. Microscopic Comment The morphology along with the patient's history are consistent with recurrent endometrioid adenocarcinoma. The carcinoma has a similar appearance to the primary ([id number]).   05/20/2016 Imaging   Ct scan abdomen showed solid 2.5 cm peritoneal mass in the mid to anterior left pelvis, suspicious for peritoneal metastasis. 2. Small expansile low-attenuation filling defect in the left external iliac vein, cannot exclude a small deep venous thrombus. Consider correlation with left lower extremity venous Doppler scan. 3. Small simple fluid density structure in the left pelvic sidewall abutting the left external iliac vessels, favor a small postoperative seroma. 4. No ascites. 5. No lymphadenopathy.  No metastatic disease in the chest. 6. Aortic atherosclerosis.   06/12/2016 PET scan  Intensely hypermetabolic 2.1 cm central pelvic  peritoneal mass just to the left of midline, consistent with peritoneal metastatic recurrence. No ascites. 2. No additional hypermetabolic sites of metastatic disease. 3. Diffuse thyroid hypermetabolism without discrete thyroid nodule, favoring thyroiditis. Recommend correlation with serum thyroid function tests.   06/24/2016 - 07/22/2016 Chemotherapy   The patient had weekly cisplatin. She has missed several doses due to infection and pancytopenia   06/24/2016 - 09/04/2016 Radiation Therapy   She completed concurrent radiation therapy Radiation treatment dates:   IMRT : 06/24/16 - 08/01/16 HDR : 08/13/16, 08/20/16, 08/27/16, 09/04/16  Site/dose:   Pelvis treated to 55 Gy in 25 fractions (simultaneous integrated boost technique) Vaginal Cuff treated to 24 Gy in 4 fractions   08/15/2016 - 12/03/2016 Chemotherapy   She received 6 cycles of carboplatin/Taxol   09/27/2016 Imaging   Interval decrease in size of previously described solid peritoneal nodule within the left anterior pelvis. Near complete resolution of previously described low-attenuation structure along the left pelvic sidewall. No evidence for metastatic disease in the chest. Aortic atherosclerosis.   12/30/2016 Imaging   Ct abdomen 1. Solitary left pelvic peritoneal implant is mildly decreased in size in the interval. 2. No new or progressive metastatic disease in the abdomen or pelvis. No ascites. 3. Aortic atherosclerosis.   04/02/2017 Imaging   Left lower quadrant peritoneal implant referenced on previous exam measures 1.7 x 1.3 cm, image 69 of series 2. Increased from 0.8 x 0.8 cm previously peer no new peritoneal implants identified.  Musculoskeletal: The degenerative disc disease noted within the lumbar spine.  IMPRESSION: 1. Solitary left pelvic peritoneal implant is increased in size in the interval. 2. No new sites of disease.  No ascites. 3. Aortic atherosclerosis   04/11/2017 PET scan   1. The left side of pelvis  peritoneal implant has decreased in size and degree of FDG uptake compatible with response to therapy. No new areas of peritoneal disease identified. 2. Persistent diffuse increased uptake within the thyroid gland. Correlation with patient's thyroid function may be helpful.   05/26/2017 Imaging   1. Enlarging tumor implant along the left adnexa, currently 2.6 by 2.4 cm and previously 1.9 by 1.3 cm. No new tumor implant or other specific cause for the patient's pelvic symptoms is currently identified. 2.  Aortic Atherosclerosis (ICD10-I70.0). 3. Lumbar spondylosis and degenerative disc disease causing multilevel impingement.   06/04/2017 - 08/28/2017 Chemotherapy   The patient started letrozole and everolimus   08/26/2017 Imaging   Increased size of mass in the left adnexal region.  Several new small liver metastases in right hepatic lobe   09/08/2017 Imaging   LV EF: 60% -  65%   09/10/2017 Procedure   Technically successful right IJ power-injectable port catheter placement. Ready for routine use   12/04/2017 Imaging   1. Interval increase in size and number of multiple lesions within the liver compatible with hepatic metastatic disease. 2. Interval increase in size mass within the left hemipelvis.   12/15/2017 -  Chemotherapy   The patient had pembrolizumab (KEYTRUDA) 200 mg in sodium chloride 0.9 % 50 mL chemo infusion, 200 mg, Intravenous, Once, 1 of 6 cycles Administration: 200 mg (12/15/2017)  for chemotherapy treatment.    12/21/2017 Imaging   1. Moderate left hydronephrosis and hydroureter, similar compared to most recent CT from May 2019; obstruction appears to be secondary to a left pelvic mass/metastatic focus which has increased in size since the prior CT. 2. Numerous hepatic metastatic lesions, suspect that  some may be increased in size but difficult to further characterize without intravenous contrast. Increased size of left pelvic soft tissue mass/metastatic lesion. Mass abuts  and possibly invades the sigmoid colon.    02/20/2018 Imaging   Interval decrease in hepatic metastases.  Decreased mass or lymphadenopathy in the left external iliac chain. Interval resolution of left hydroureteronephrosis.  No new or progressive disease within the abdomen or pelvis.   05/16/2018 Imaging   05/16/2018 CT Abdomen IMPRESSION: 1. Slight interval decrease in size of hepatic metastatic disease. 2. Slight interval decrease in size centrally necrotic mass within the left external iliac region.   09/18/2018 Imaging   1. Today's study demonstrates a mixed response to therapy. Specifically, while the previously noted hepatic metastases appear decreased, the soft tissue mass along the left pelvic sidewall appears slightly larger than the prior study. No new metastatic disease is noted elsewhere in the abdomen or pelvis. 2. Aortic atherosclerosis. 3. Mild cardiomegaly. 4. Additional incidental findings, as above    03/12/2019 Imaging   1. Overall improvement with reduced size of the hepatic metastatic lesions, and reduced size of the irregular soft tissue mass in the left pelvis. 2. Other imaging findings of potential clinical significance: Mild distal esophageal wall thickening, cannot exclude esophagitis. Aortic Atherosclerosis (ICD10-I70.0). Lumbar impingement at L5-S1.     09/09/2019 Imaging   1. Stable size and appearance of the somewhat irregular left adnexal mass, currently 2.8 by 1.9 cm. 2. The scattered hepatic metastatic lesions are stable from 03/12/2019, and represent effectively treated metastatic lesions. 3. Other imaging findings of potential clinical significance: Airway thickening is present, suggesting bronchitis or reactive airways disease. Lumbar spondylosis and degenerative disc disease causing impingement at L4-5 and L5-S1. Small umbilical hernia contains adipose tissue.   Aortic Atherosclerosis (ICD10-I70.0).     REVIEW OF SYSTEMS:   Constitutional:  Denies fevers, chills or abnormal weight loss Eyes: Denies blurriness of vision Ears, nose, mouth, throat, and face: Denies mucositis or sore throat Respiratory: Denies cough, dyspnea or wheezes Cardiovascular: Denies palpitation, chest discomfort or lower extremity swelling Gastrointestinal:  Denies nausea, heartburn or change in bowel habits Skin: Denies abnormal skin rashes Lymphatics: Denies new lymphadenopathy or easy bruising Neurological:Denies numbness, tingling or new weaknesses Behavioral/Psych: Mood is stable, no new changes  All other systems were reviewed with the patient and are negative.  I have reviewed the past medical history, past surgical history, social history and family history with the patient and they are unchanged from previous note.  ALLERGIES:  is allergic to latex.  MEDICATIONS:  Current Outpatient Medications  Medication Sig Dispense Refill  . acetaminophen (TYLENOL) 325 MG tablet Take 650 mg by mouth every 6 (six) hours as needed for mild pain or moderate pain.     Marland Kitchen amoxicillin (AMOXIL) 500 MG capsule Take 2,000 mg by mouth See admin instructions. Prior to dental appointment, takes 4 tables prior to appt    . Biotin w/ Vitamins C & E (HAIR/SKIN/NAILS PO) Take 1 tablet by mouth daily.    . Calcium Carb-Cholecalciferol (CALCIUM 600 + D PO) Take 2 tablets by mouth daily.    . fluticasone (FLONASE) 50 MCG/ACT nasal spray Place 1 spray into both nostrils 2 (two) times daily as needed for allergies.   1  . gabapentin (NEURONTIN) 600 MG tablet TAKE 1 TABLET BY MOUTH TWICE A DAY FOR RESTLESS LEGS AND NEUROPATHY.    Marland Kitchen HYDROcodone-acetaminophen (NORCO) 10-325 MG tablet Take 1 tablet by mouth every 6 (six) hours as needed.  30 tablet 0  . ibuprofen (ADVIL,MOTRIN) 200 MG tablet Take 400 mg by mouth every 8 (eight) hours as needed for mild pain.    Marland Kitchen levothyroxine (SYNTHROID) 200 MCG tablet Take 1 tablet (200 mcg total) by mouth daily before breakfast. 90 tablet 11  .  lidocaine-prilocaine (EMLA) cream APPLY TO AFFECTED AREA ONCE 30 g 3  . loratadine (CLARITIN) 10 MG tablet Take 10 mg by mouth daily.    Marland Kitchen LORazepam (ATIVAN) 1 MG tablet Take 1 tablet (1 mg total) by mouth every 8 (eight) hours as needed for anxiety or sleep. 90 tablet 3  . losartan-hydrochlorothiazide (HYZAAR) 100-12.5 MG tablet Take 0.5 tablets by mouth daily. 60 tablet 9  . magnesium oxide (MAG-OX) 400 MG tablet Take 1 tablet by mouth 2 (two) times daily.    . Melatonin 10 MG CAPS Take 10 mg by mouth at bedtime.    . niacinamide 500 MG tablet Take 500 mg by mouth 3 (three) times daily with meals.    . ondansetron (ZOFRAN) 8 MG tablet Take 1 tablet (8 mg total) by mouth every 8 (eight) hours as needed for nausea. 60 tablet 1  . potassium gluconate 595 (99 K) MG TABS tablet Take by mouth.    . pramipexole (MIRAPEX) 0.5 MG tablet Take 1 mg by mouth at bedtime.    . traZODone (DESYREL) 50 MG tablet Take 1.5 tablets (75 mg total) by mouth at bedtime. (Patient taking differently: Take 50 mg by mouth at bedtime. ) 135 tablet 1   No current facility-administered medications for this visit.    PHYSICAL EXAMINATION: ECOG PERFORMANCE STATUS: 1 - Symptomatic but completely ambulatory  Vitals:   09/10/19 1340  BP: (!) 147/88  Pulse: 83  Resp: 18  Temp: 98.7 F (37.1 C)  SpO2: 98%   Filed Weights   09/10/19 1340  Weight: 217 lb (98.4 kg)    GENERAL:alert, no distress and comfortable NEURO: alert & oriented x 3 with fluent speech, no focal motor/sensory deficits  LABORATORY DATA:  I have reviewed the data as listed    Component Value Date/Time   NA 144 08/31/2019 0931   NA 141 07/09/2017 1312   K 3.8 08/31/2019 0931   K 3.9 07/09/2017 1312   CL 108 08/31/2019 0931   CO2 28 08/31/2019 0931   CO2 27 07/09/2017 1312   GLUCOSE 111 (H) 08/31/2019 0931   GLUCOSE 109 07/09/2017 1312   BUN 17 08/31/2019 0931   BUN 16.4 07/09/2017 1312   CREATININE 0.78 08/31/2019 0931   CREATININE 0.91  01/04/2019 1351   CREATININE 0.8 07/09/2017 1312   CALCIUM 9.1 08/31/2019 0931   CALCIUM 9.2 07/09/2017 1312   PROT 6.5 08/31/2019 0931   PROT 7.1 07/09/2017 1312   ALBUMIN 3.5 08/31/2019 0931   ALBUMIN 3.1 (L) 07/09/2017 1312   AST 14 (L) 08/31/2019 0931   AST 19 01/04/2019 1351   AST 35 (H) 07/09/2017 1312   ALT 12 08/31/2019 0931   ALT 20 01/04/2019 1351   ALT 43 07/09/2017 1312   ALKPHOS 109 08/31/2019 0931   ALKPHOS 182 (H) 07/09/2017 1312   BILITOT 0.5 08/31/2019 0931   BILITOT 0.3 01/04/2019 1351   BILITOT 0.34 07/09/2017 1312   GFRNONAA >60 08/31/2019 0931   GFRNONAA >60 01/04/2019 1351   GFRAA >60 08/31/2019 0931   GFRAA >60 01/04/2019 1351    No results found for: SPEP, UPEP  Lab Results  Component Value Date   WBC 5.5 08/31/2019  NEUTROABS 3.5 08/31/2019   HGB 11.8 (L) 08/31/2019   HCT 35.4 (L) 08/31/2019   MCV 83.3 08/31/2019   PLT 184 08/31/2019      Chemistry      Component Value Date/Time   NA 144 08/31/2019 0931   NA 141 07/09/2017 1312   K 3.8 08/31/2019 0931   K 3.9 07/09/2017 1312   CL 108 08/31/2019 0931   CO2 28 08/31/2019 0931   CO2 27 07/09/2017 1312   BUN 17 08/31/2019 0931   BUN 16.4 07/09/2017 1312   CREATININE 0.78 08/31/2019 0931   CREATININE 0.91 01/04/2019 1351   CREATININE 0.8 07/09/2017 1312      Component Value Date/Time   CALCIUM 9.1 08/31/2019 0931   CALCIUM 9.2 07/09/2017 1312   ALKPHOS 109 08/31/2019 0931   ALKPHOS 182 (H) 07/09/2017 1312   AST 14 (L) 08/31/2019 0931   AST 19 01/04/2019 1351   AST 35 (H) 07/09/2017 1312   ALT 12 08/31/2019 0931   ALT 20 01/04/2019 1351   ALT 43 07/09/2017 1312   BILITOT 0.5 08/31/2019 0931   BILITOT 0.3 01/04/2019 1351   BILITOT 0.34 07/09/2017 1312       RADIOGRAPHIC STUDIES: I have reviewed multiple imaging studies with the patient and her family I have personally reviewed the radiological images as listed and agreed with the findings in the report. CT ABDOMEN PELVIS W  CONTRAST  Result Date: 09/09/2019 CLINICAL DATA:  Restaging of endometrial cancer. EXAM: CT ABDOMEN AND PELVIS WITH CONTRAST TECHNIQUE: Multidetector CT imaging of the abdomen and pelvis was performed using the standard protocol following bolus administration of intravenous contrast. CONTRAST:  150m OMNIPAQUE IOHEXOL 300 MG/ML  SOLN COMPARISON:  03/12/2019 FINDINGS: Lower chest: Stable minimal nodularity along the right posterior pleural surface on image 9/4, not changed from 2017, considered benign. Mild airway thickening. Hepatobiliary: Hypodense lesion internal reticular calcification measures 0.9 by 0.7 cm on image 11/2, no change from 03/12/2019. A hypodense right hepatic lobe lesion on image 17/2 measures 0.9 by 0.8 cm, formerly 1.0 by 0.8 cm. Essentially stable. Hypodense 0.8 by 0.7 cm right hepatic lobe lesion on image 29/2 posteriorly, previously 0.8 by 0.6 cm, essentially stable. The subtle hypodensity previously seen along the gallbladder fossa is not well appreciated on today's exam. No new lesions. Gallbladder unremarkable. Pancreas: Unremarkable Spleen: Unremarkable Adrenals/Urinary Tract: Both adrenal glands appear normal. The kidneys appear normal. Stomach/Bowel: Unremarkable Vascular/Lymphatic: Aortoiliac atherosclerotic vascular disease. Reproductive: Irregularly marginated density along the left adnexa, immediately adjacent to the sigmoid colon. This has internal calcifications and peripheral spiculation, and on image 36/5 measures 2.8 by 1.9 cm, previously the same. This lesion is probably best measured on the coronal images given the orientation and proximity to the colon. Other: No supplemental non-categorized findings. Musculoskeletal: Small umbilical hernia contains adipose tissue. Lumbar spondylosis and degenerative disc disease causing impingement at L4-5 and L5-S1 bilaterally. IMPRESSION: 1. Stable size and appearance of the somewhat irregular left adnexal mass, currently 2.8 by 1.9 cm.  2. The scattered hepatic metastatic lesions are stable from 03/12/2019, and represent effectively treated metastatic lesions. 3. Other imaging findings of potential clinical significance: Airway thickening is present, suggesting bronchitis or reactive airways disease. Lumbar spondylosis and degenerative disc disease causing impingement at L4-5 and L5-S1. Small umbilical hernia contains adipose tissue. Aortic Atherosclerosis (ICD10-I70.0). Electronically Signed   By: WVan ClinesM.D.   On: 09/09/2019 16:27

## 2019-09-10 NOTE — Assessment & Plan Note (Signed)
The liver lesions are stable We will observe closely 

## 2019-09-10 NOTE — Telephone Encounter (Signed)
Scheduled appt per 3/5 sch message - unable to reach pt . Left message with appt date and time

## 2019-09-10 NOTE — Assessment & Plan Note (Signed)
I have reviewed multiple imaging studies with the patient and her family She has no signs of cancer progression Overall, she tolerated treatment well I think her abdominal pain/back pain is related to her chronic degenerative arthritis and not necessarily from her disease We will continue pembrolizumab indefinitely Her next treatment is due on March 16 I will see her next month for further follow-up I do not plan to repeat imaging study again until September 2021

## 2019-09-10 NOTE — Assessment & Plan Note (Signed)
We discussed the link between obesity and cancer in the contribution from obesity causing degenerative changes to her back She is interested to lose weight We discussed some common strategies including drinking enough liquid per day, getting adequate sleep, weighing herself daily, avoiding late night snacking and tracking her oral intake and to gently introduce some activity as tolerated She appears motivated to lose some weight She will set a target goal of losing 2 pounds by the time I see her in April I encouraged the patient today

## 2019-09-21 ENCOUNTER — Inpatient Hospital Stay: Payer: Medicare Other

## 2019-09-21 ENCOUNTER — Other Ambulatory Visit: Payer: Self-pay

## 2019-09-21 VITALS — BP 167/86 | HR 84 | Temp 98.3°F | Resp 18 | Wt 211.1 lb

## 2019-09-21 DIAGNOSIS — C541 Malignant neoplasm of endometrium: Secondary | ICD-10-CM

## 2019-09-21 DIAGNOSIS — Z5112 Encounter for antineoplastic immunotherapy: Secondary | ICD-10-CM | POA: Diagnosis not present

## 2019-09-21 DIAGNOSIS — E039 Hypothyroidism, unspecified: Secondary | ICD-10-CM

## 2019-09-21 DIAGNOSIS — C787 Secondary malignant neoplasm of liver and intrahepatic bile duct: Secondary | ICD-10-CM

## 2019-09-21 LAB — CBC WITH DIFFERENTIAL/PLATELET
Abs Immature Granulocytes: 0.01 10*3/uL (ref 0.00–0.07)
Basophils Absolute: 0 10*3/uL (ref 0.0–0.1)
Basophils Relative: 1 %
Eosinophils Absolute: 0.3 10*3/uL (ref 0.0–0.5)
Eosinophils Relative: 5 %
HCT: 37 % (ref 36.0–46.0)
Hemoglobin: 12.3 g/dL (ref 12.0–15.0)
Immature Granulocytes: 0 %
Lymphocytes Relative: 24 %
Lymphs Abs: 1.4 10*3/uL (ref 0.7–4.0)
MCH: 27.4 pg (ref 26.0–34.0)
MCHC: 33.2 g/dL (ref 30.0–36.0)
MCV: 82.4 fL (ref 80.0–100.0)
Monocytes Absolute: 0.4 10*3/uL (ref 0.1–1.0)
Monocytes Relative: 7 %
Neutro Abs: 3.6 10*3/uL (ref 1.7–7.7)
Neutrophils Relative %: 63 %
Platelets: 196 10*3/uL (ref 150–400)
RBC: 4.49 MIL/uL (ref 3.87–5.11)
RDW: 13.2 % (ref 11.5–15.5)
WBC: 5.7 10*3/uL (ref 4.0–10.5)
nRBC: 0 % (ref 0.0–0.2)

## 2019-09-21 LAB — COMPREHENSIVE METABOLIC PANEL
ALT: 13 U/L (ref 0–44)
AST: 18 U/L (ref 15–41)
Albumin: 3.7 g/dL (ref 3.5–5.0)
Alkaline Phosphatase: 110 U/L (ref 38–126)
Anion gap: 10 (ref 5–15)
BUN: 19 mg/dL (ref 8–23)
CO2: 26 mmol/L (ref 22–32)
Calcium: 9.3 mg/dL (ref 8.9–10.3)
Chloride: 107 mmol/L (ref 98–111)
Creatinine, Ser: 0.81 mg/dL (ref 0.44–1.00)
GFR calc Af Amer: >60 mL/min (ref >60)
GFR calc non Af Amer: >60 mL/min (ref >60)
Glucose, Bld: 110 mg/dL — ABNORMAL HIGH (ref 70–99)
Potassium: 3.7 mmol/L (ref 3.5–5.1)
Sodium: 143 mmol/L (ref 135–145)
Total Bilirubin: 0.5 mg/dL (ref 0.3–1.2)
Total Protein: 6.7 g/dL (ref 6.5–8.1)

## 2019-09-21 LAB — TSH: TSH: 3.712 u[IU]/mL (ref 0.308–3.960)

## 2019-09-21 MED ORDER — SODIUM CHLORIDE 0.9% FLUSH
10.0000 mL | INTRAVENOUS | Status: DC | PRN
  Administered 2019-09-21: 10 mL
  Filled 2019-09-21: qty 10

## 2019-09-21 MED ORDER — SODIUM CHLORIDE 0.9 % IV SOLN
Freq: Once | INTRAVENOUS | Status: AC
  Filled 2019-09-21: qty 250

## 2019-09-21 MED ORDER — SODIUM CHLORIDE 0.9% FLUSH
10.0000 mL | Freq: Once | INTRAVENOUS | Status: AC
  Administered 2019-09-21: 09:00:00 10 mL
  Filled 2019-09-21: qty 10

## 2019-09-21 MED ORDER — HEPARIN SOD (PORK) LOCK FLUSH 100 UNIT/ML IV SOLN
500.0000 [IU] | Freq: Once | INTRAVENOUS | Status: AC | PRN
  Administered 2019-09-21: 500 [IU]
  Filled 2019-09-21: qty 5

## 2019-09-21 MED ORDER — SODIUM CHLORIDE 0.9 % IV SOLN
200.0000 mg | Freq: Once | INTRAVENOUS | Status: AC
  Administered 2019-09-21: 200 mg via INTRAVENOUS
  Filled 2019-09-21: qty 8

## 2019-09-21 NOTE — Patient Instructions (Signed)
[facility] Discharge Instructions for Patients Receiving Chemotherapy  Today you received the following chemotherapy agents:  Keytruda.  To help prevent nausea and vomiting after your treatment, we encourage you to take your nausea medication as directed.   If you develop nausea and vomiting that is not controlled by your nausea medication, call the clinic.   BELOW ARE SYMPTOMS THAT SHOULD BE REPORTED IMMEDIATELY:  *FEVER GREATER THAN 100.5 F  *CHILLS WITH OR WITHOUT FEVER  NAUSEA AND VOMITING THAT IS NOT CONTROLLED WITH YOUR NAUSEA MEDICATION  *UNUSUAL SHORTNESS OF BREATH  *UNUSUAL BRUISING OR BLEEDING  TENDERNESS IN MOUTH AND THROAT WITH OR WITHOUT PRESENCE OF ULCERS  *URINARY PROBLEMS  *BOWEL PROBLEMS  UNUSUAL RASH Items with * indicate a potential emergency and should be followed up as soon as possible.  Feel free to call the clinic should you have any questions or concerns. The clinic phone number is [phone].  Please show the CHEMO ALERT CARD at check-in to the Emergency Department and triage nurse.    

## 2019-10-06 NOTE — Progress Notes (Signed)
Pharmacist Chemotherapy Monitoring - Follow Up Assessment    I verify that I have reviewed each item in the below checklist:  . Regimen for the patient is scheduled for the appropriate day and plan matches scheduled date. Marland Kitchen Appropriate non-routine labs are ordered dependent on drug ordered. . If applicable, additional medications reviewed and ordered per protocol based on lifetime cumulative doses and/or treatment regimen.   Plan for follow-up and/or issues identified: No . I-vent associated with next due treatment: No . MD and/or nursing notified: No  Acquanetta Belling 10/06/2019 3:47 PM

## 2019-10-12 ENCOUNTER — Other Ambulatory Visit: Payer: Self-pay

## 2019-10-12 ENCOUNTER — Inpatient Hospital Stay (HOSPITAL_BASED_OUTPATIENT_CLINIC_OR_DEPARTMENT_OTHER): Payer: Medicare Other | Admitting: Hematology and Oncology

## 2019-10-12 ENCOUNTER — Inpatient Hospital Stay: Payer: Medicare Other | Attending: Hematology and Oncology

## 2019-10-12 ENCOUNTER — Encounter: Payer: Self-pay | Admitting: Hematology and Oncology

## 2019-10-12 ENCOUNTER — Inpatient Hospital Stay: Payer: Medicare Other

## 2019-10-12 VITALS — BP 153/73 | HR 74 | Temp 98.7°F | Resp 18 | Ht 65.5 in | Wt 211.6 lb

## 2019-10-12 DIAGNOSIS — E039 Hypothyroidism, unspecified: Secondary | ICD-10-CM | POA: Insufficient documentation

## 2019-10-12 DIAGNOSIS — E669 Obesity, unspecified: Secondary | ICD-10-CM | POA: Diagnosis not present

## 2019-10-12 DIAGNOSIS — Z791 Long term (current) use of non-steroidal anti-inflammatories (NSAID): Secondary | ICD-10-CM | POA: Insufficient documentation

## 2019-10-12 DIAGNOSIS — C787 Secondary malignant neoplasm of liver and intrahepatic bile duct: Secondary | ICD-10-CM | POA: Insufficient documentation

## 2019-10-12 DIAGNOSIS — Z5112 Encounter for antineoplastic immunotherapy: Secondary | ICD-10-CM | POA: Insufficient documentation

## 2019-10-12 DIAGNOSIS — Z923 Personal history of irradiation: Secondary | ICD-10-CM | POA: Insufficient documentation

## 2019-10-12 DIAGNOSIS — C541 Malignant neoplasm of endometrium: Secondary | ICD-10-CM | POA: Insufficient documentation

## 2019-10-12 DIAGNOSIS — Z9221 Personal history of antineoplastic chemotherapy: Secondary | ICD-10-CM | POA: Insufficient documentation

## 2019-10-12 DIAGNOSIS — Z79899 Other long term (current) drug therapy: Secondary | ICD-10-CM | POA: Insufficient documentation

## 2019-10-12 LAB — CBC WITH DIFFERENTIAL/PLATELET
Abs Immature Granulocytes: 0.02 10*3/uL (ref 0.00–0.07)
Basophils Absolute: 0 10*3/uL (ref 0.0–0.1)
Basophils Relative: 1 %
Eosinophils Absolute: 0.5 10*3/uL (ref 0.0–0.5)
Eosinophils Relative: 7 %
HCT: 37.7 % (ref 36.0–46.0)
Hemoglobin: 12.3 g/dL (ref 12.0–15.0)
Immature Granulocytes: 0 %
Lymphocytes Relative: 23 %
Lymphs Abs: 1.6 10*3/uL (ref 0.7–4.0)
MCH: 27 pg (ref 26.0–34.0)
MCHC: 32.6 g/dL (ref 30.0–36.0)
MCV: 82.9 fL (ref 80.0–100.0)
Monocytes Absolute: 0.5 10*3/uL (ref 0.1–1.0)
Monocytes Relative: 7 %
Neutro Abs: 4.2 10*3/uL (ref 1.7–7.7)
Neutrophils Relative %: 62 %
Platelets: 176 10*3/uL (ref 150–400)
RBC: 4.55 MIL/uL (ref 3.87–5.11)
RDW: 13.4 % (ref 11.5–15.5)
WBC: 6.8 10*3/uL (ref 4.0–10.5)
nRBC: 0 % (ref 0.0–0.2)

## 2019-10-12 LAB — COMPREHENSIVE METABOLIC PANEL
ALT: 18 U/L (ref 0–44)
AST: 19 U/L (ref 15–41)
Albumin: 3.7 g/dL (ref 3.5–5.0)
Alkaline Phosphatase: 130 U/L — ABNORMAL HIGH (ref 38–126)
Anion gap: 8 (ref 5–15)
BUN: 23 mg/dL (ref 8–23)
CO2: 29 mmol/L (ref 22–32)
Calcium: 9.6 mg/dL (ref 8.9–10.3)
Chloride: 103 mmol/L (ref 98–111)
Creatinine, Ser: 0.84 mg/dL (ref 0.44–1.00)
GFR calc Af Amer: >60 mL/min (ref >60)
GFR calc non Af Amer: >60 mL/min (ref >60)
Glucose, Bld: 87 mg/dL (ref 70–99)
Potassium: 4 mmol/L (ref 3.5–5.1)
Sodium: 140 mmol/L (ref 135–145)
Total Bilirubin: 0.6 mg/dL (ref 0.3–1.2)
Total Protein: 7 g/dL (ref 6.5–8.1)

## 2019-10-12 LAB — TSH: TSH: 2.303 u[IU]/mL (ref 0.308–3.960)

## 2019-10-12 MED ORDER — SODIUM CHLORIDE 0.9 % IV SOLN
Freq: Once | INTRAVENOUS | Status: AC
  Filled 2019-10-12: qty 250

## 2019-10-12 MED ORDER — SODIUM CHLORIDE 0.9 % IV SOLN
200.0000 mg | Freq: Once | INTRAVENOUS | Status: AC
  Administered 2019-10-12: 200 mg via INTRAVENOUS
  Filled 2019-10-12: qty 8

## 2019-10-12 MED ORDER — HEPARIN SOD (PORK) LOCK FLUSH 100 UNIT/ML IV SOLN
500.0000 [IU] | Freq: Once | INTRAVENOUS | Status: AC | PRN
  Administered 2019-10-12: 500 [IU]
  Filled 2019-10-12: qty 5

## 2019-10-12 MED ORDER — SODIUM CHLORIDE 0.9% FLUSH
10.0000 mL | Freq: Once | INTRAVENOUS | Status: AC
  Administered 2019-10-12: 10 mL
  Filled 2019-10-12: qty 10

## 2019-10-12 MED ORDER — SODIUM CHLORIDE 0.9% FLUSH
10.0000 mL | INTRAVENOUS | Status: DC | PRN
  Administered 2019-10-12: 10 mL
  Filled 2019-10-12: qty 10

## 2019-10-12 MED ORDER — HEPARIN SOD (PORK) LOCK FLUSH 100 UNIT/ML IV SOLN
500.0000 [IU] | Freq: Once | INTRAVENOUS | Status: DC
  Filled 2019-10-12: qty 5

## 2019-10-12 NOTE — Progress Notes (Signed)
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[place]) Overall, she tolerated treatment well We will continue pembrolizumab indefinitely I do not plan to repeat imaging study again until September 2021  Acquired hypothyroidism Her recent TSH is satisfactory She will continue chronic thyroid replacement therapy We will adjust her thyroid medicine as needed  Obesity, Class II, BMI 35-39.9 I congratulated her efforts She has lost some weight since last time I saw her She will continue dietary modification and weight loss strategy She has made commitment to lose 5 more pounds before I see her next and I continue to encourage her effort   Orders Placed This Encounter  Procedures  . TSH    Standing Status:   Standing    Number of Occurrences:   11    Standing Expiration Date:   10/11/2020    All questions were answered. The patient knows to call the clinic with any problems, questions or concerns. The total time spent in the appointment was 20 minutes encounter with patients including review of chart and various tests results, discussions about plan of care and coordination of care plan   Heath Lark, MD 10/12/2019 1:28 PM  INTERVAL HISTORY: Please see below for problem oriented charting. She returns for further follow-up She is doing well She has lost some weight since last time I saw her through dietary modification She denies abdominal pain or changes in bowel habits No recent infusion reactions No recent infection, fever or chills  SUMMARY OF ONCOLOGIC HISTORY: Oncology History Overview Note  MSI stable on June 2017 tissue but high from [facility] study from November 2017  05/15/16: ER is moderately positive (70%). PR is strongly positive (80%).   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 Lymph node, sentinel, biopsy, right obturator ONE BENIGN LYMPH NODE (0/1) 3. Lymph nodes, regional resection, left pelvic FOUR BENIGN LYMPH NODES (0/4)   01/02/2016 Surgery   Dr. Denman George performed robotic-assisted laparoscopic total hysterectomy with bilateral salpingoophorectomy, sentinel lymph node biopsy, lymphadenectomy     05/15/2016 Pathology Results   Vagina, biopsy, mid - ADENOCARCINOMA, SEE COMMENT. Microscopic Comment The morphology along with the patient's history are consistent with recurrent endometrioid adenocarcinoma. The carcinoma has a similar appearance to the primary ([id number]).   05/20/2016 Imaging   Ct scan abdomen showed solid 2.5 cm peritoneal mass in the mid to anterior left pelvis, suspicious for peritoneal metastasis. 2. Small expansile low-attenuation filling defect in the left external iliac vein, cannot exclude a small deep venous thrombus. Consider correlation with left lower extremity venous Doppler scan. 3. Small simple fluid density structure in the left pelvic sidewall abutting the left external iliac vessels, favor a small postoperative seroma. 4. No ascites. 5. No lymphadenopathy.  No metastatic disease in the chest. 6. Aortic atherosclerosis.   06/12/2016 PET scan   Intensely hypermetabolic 2.1 cm central pelvic peritoneal mass just to the left of midline, consistent with peritoneal metastatic recurrence. No ascites. 2. No additional hypermetabolic sites of metastatic disease. 3. Diffuse thyroid hypermetabolism without discrete thyroid nodule, favoring thyroiditis. Recommend correlation with serum thyroid function tests.   06/24/2016 -  07/22/2016 Chemotherapy   The  patient had weekly cisplatin. She has missed several doses due to infection and pancytopenia   06/24/2016 - 09/04/2016 Radiation Therapy   She completed concurrent radiation therapy Radiation treatment dates:   IMRT : 06/24/16 - 08/01/16 HDR : 08/13/16, 08/20/16, 08/27/16, 09/04/16  Site/dose:   Pelvis treated to 55 Gy in 25 fractions (simultaneous integrated boost technique) Vaginal Cuff treated to 24 Gy in 4 fractions   08/15/2016 - 12/03/2016 Chemotherapy   She received 6 cycles of carboplatin/Taxol   09/27/2016 Imaging   Interval decrease in size of previously described solid peritoneal nodule within the left anterior pelvis. Near complete resolution of previously described low-attenuation structure along the left pelvic sidewall. No evidence for metastatic disease in the chest. Aortic atherosclerosis.   12/30/2016 Imaging   Ct abdomen 1. Solitary left pelvic peritoneal implant is mildly decreased in size in the interval. 2. No new or progressive metastatic disease in the abdomen or pelvis. No ascites. 3. Aortic atherosclerosis.   04/02/2017 Imaging   Left lower quadrant peritoneal implant referenced on previous exam measures 1.7 x 1.3 cm, image 69 of series 2. Increased from 0.8 x 0.8 cm previously peer no new peritoneal implants identified.  Musculoskeletal: The degenerative disc disease noted within the lumbar spine.  IMPRESSION: 1. Solitary left pelvic peritoneal implant is increased in size in the interval. 2. No new sites of disease.  No ascites. 3. Aortic atherosclerosis   04/11/2017 PET scan   1. The left side of pelvis peritoneal implant has decreased in size and degree of FDG uptake compatible with response to therapy. No new areas of peritoneal disease identified. 2. Persistent diffuse increased uptake within the thyroid gland. Correlation with patient's thyroid function may be helpful.   05/26/2017 Imaging   1. Enlarging tumor implant along the left adnexa, currently 2.6  by 2.4 cm and previously 1.9 by 1.3 cm. No new tumor implant or other specific cause for the patient's pelvic symptoms is currently identified. 2.  Aortic Atherosclerosis (ICD10-I70.0). 3. Lumbar spondylosis and degenerative disc disease causing multilevel impingement.   06/04/2017 - 08/28/2017 Chemotherapy   The patient started letrozole and everolimus   08/26/2017 Imaging   Increased size of mass in the left adnexal region.  Several new small liver metastases in right hepatic lobe   09/08/2017 Imaging   LV EF: 60% -  65%   09/10/2017 Procedure   Technically successful right IJ power-injectable port catheter placement. Ready for routine use   12/04/2017 Imaging   1. Interval increase in size and number of multiple lesions within the liver compatible with hepatic metastatic disease. 2. Interval increase in size mass within the left hemipelvis.   12/15/2017 -  Chemotherapy   The patient had pembrolizumab (KEYTRUDA) 200 mg in sodium chloride 0.9 % 50 mL chemo infusion, 200 mg, Intravenous, Once, 1 of 6 cycles Administration: 200 mg (12/15/2017)  for chemotherapy treatment.    12/21/2017 Imaging   1. Moderate left hydronephrosis and hydroureter, similar compared to most recent CT from May 2019; obstruction appears to be secondary to a left pelvic mass/metastatic focus which has increased in size since the prior CT. 2. Numerous hepatic metastatic lesions, suspect that some may be increased in size but difficult to further characterize without intravenous contrast. Increased size of left pelvic soft tissue mass/metastatic lesion. Mass abuts and possibly invades the sigmoid colon.    02/20/2018 Imaging   Interval decrease in hepatic metastases.  Decreased mass or lymphadenopathy in the  left external iliac chain. Interval resolution of left hydroureteronephrosis.  No new or progressive disease within the abdomen or pelvis.   05/16/2018 Imaging   05/16/2018 CT Abdomen IMPRESSION: 1. Slight  interval decrease in size of hepatic metastatic disease. 2. Slight interval decrease in size centrally necrotic mass within the left external iliac region.   09/18/2018 Imaging   1. Today's study demonstrates a mixed response to therapy. Specifically, while the previously noted hepatic metastases appear decreased, the soft tissue mass along the left pelvic sidewall appears slightly larger than the prior study. No new metastatic disease is noted elsewhere in the abdomen or pelvis. 2. Aortic atherosclerosis. 3. Mild cardiomegaly. 4. Additional incidental findings, as above    03/12/2019 Imaging   1. Overall improvement with reduced size of the hepatic metastatic lesions, and reduced size of the irregular soft tissue mass in the left pelvis. 2. Other imaging findings of potential clinical significance: Mild distal esophageal wall thickening, cannot exclude esophagitis. Aortic Atherosclerosis (ICD10-I70.0). Lumbar impingement at L5-S1.     09/09/2019 Imaging   1. Stable size and appearance of the somewhat irregular left adnexal mass, currently 2.8 by 1.9 cm. 2. The scattered hepatic metastatic lesions are stable from 03/12/2019, and represent effectively treated metastatic lesions. 3. Other imaging findings of potential clinical significance: Airway thickening is present, suggesting bronchitis or reactive airways disease. Lumbar spondylosis and degenerative disc disease causing impingement at L4-5 and L5-S1. Small umbilical hernia contains adipose tissue.   Aortic Atherosclerosis (ICD10-I70.0).     REVIEW OF SYSTEMS:   Constitutional: Denies fevers, chills or abnormal weight loss Eyes: Denies blurriness of vision Ears, nose, mouth, throat, and face: Denies mucositis or sore throat Respiratory: Denies cough, dyspnea or wheezes Cardiovascular: Denies palpitation, chest discomfort or lower extremity swelling Gastrointestinal:  Denies nausea, heartburn or change in bowel habits Skin: Denies  abnormal skin rashes Lymphatics: Denies new lymphadenopathy or easy bruising Neurological:Denies numbness, tingling or new weaknesses Behavioral/Psych: Mood is stable, no new changes  All other systems were reviewed with the patient and are negative.  I have reviewed the past medical history, past surgical history, social history and family history with the patient and they are unchanged from previous note.  ALLERGIES:  is allergic to latex.  MEDICATIONS:  Current Outpatient Medications  Medication Sig Dispense Refill  . acetaminophen (TYLENOL) 325 MG tablet Take 650 mg by mouth every 6 (six) hours as needed for mild pain or moderate pain.     Marland Kitchen amoxicillin (AMOXIL) 500 MG capsule Take 2,000 mg by mouth See admin instructions. Prior to dental appointment, takes 4 tables prior to appt    . Biotin w/ Vitamins C & E (HAIR/SKIN/NAILS PO) Take 1 tablet by mouth daily.    . Calcium Carb-Cholecalciferol (CALCIUM 600 + D PO) Take 2 tablets by mouth daily.    . fluticasone (FLONASE) 50 MCG/ACT nasal spray Place 1 spray into both nostrils 2 (two) times daily as needed for allergies.   1  . gabapentin (NEURONTIN) 600 MG tablet TAKE 1 TABLET BY MOUTH TWICE A DAY FOR RESTLESS LEGS AND NEUROPATHY.    Marland Kitchen HYDROcodone-acetaminophen (NORCO) 10-325 MG tablet Take 1 tablet by mouth every 6 (six) hours as needed. 30 tablet 0  . ibuprofen (ADVIL,MOTRIN) 200 MG tablet Take 400 mg by mouth every 8 (eight) hours as needed for mild pain.    Marland Kitchen levothyroxine (SYNTHROID) 200 MCG tablet Take 1 tablet (200 mcg total) by mouth daily before breakfast. 90 tablet 11  . lidocaine-prilocaine (  EMLA) cream APPLY TO AFFECTED AREA ONCE 30 g 3  . loratadine (CLARITIN) 10 MG tablet Take 10 mg by mouth daily.    Marland Kitchen LORazepam (ATIVAN) 1 MG tablet Take 1 tablet (1 mg total) by mouth every 8 (eight) hours as needed for anxiety or sleep. 90 tablet 3  . losartan-hydrochlorothiazide (HYZAAR) 100-12.5 MG tablet Take 0.5 tablets by mouth daily.  60 tablet 9  . magnesium oxide (MAG-OX) 400 MG tablet Take 1 tablet by mouth 2 (two) times daily.    . Melatonin 10 MG CAPS Take 10 mg by mouth at bedtime.    . niacinamide 500 MG tablet Take 500 mg by mouth 3 (three) times daily with meals.    . ondansetron (ZOFRAN) 8 MG tablet Take 1 tablet (8 mg total) by mouth every 8 (eight) hours as needed for nausea. 60 tablet 1  . potassium gluconate 595 (99 K) MG TABS tablet Take by mouth.    . pramipexole (MIRAPEX) 0.5 MG tablet Take 1 mg by mouth at bedtime.    . traZODone (DESYREL) 50 MG tablet Take 1.5 tablets (75 mg total) by mouth at bedtime. (Patient taking differently: Take 50 mg by mouth at bedtime. ) 135 tablet 1   No current facility-administered medications for this visit.    PHYSICAL EXAMINATION: ECOG PERFORMANCE STATUS: 0 - Asymptomatic  Vitals:   10/12/19 1250  BP: (!) 153/73  Pulse: 74  Resp: 18  Temp: 98.7 F (37.1 C)  SpO2: 100%   Filed Weights   10/12/19 1250  Weight: 211 lb 9.6 oz (96 kg)    GENERAL:alert, no distress and comfortable SKIN: skin color, texture, turgor are normal, no rashes or significant lesions EYES: normal, Conjunctiva are pink and non-injected, sclera clear OROPHARYNX:no exudate, no erythema and lips, buccal mucosa, and tongue normal  NECK: supple, thyroid normal size, non-tender, without nodularity LYMPH:  no palpable lymphadenopathy in the cervical, axillary or inguinal LUNGS: clear to auscultation and percussion with normal breathing effort HEART: regular rate & rhythm and no murmurs and no lower extremity edema ABDOMEN:abdomen soft, non-tender and normal bowel sounds Musculoskeletal:no cyanosis of digits and no clubbing  NEURO: alert & oriented x 3 with fluent speech, no focal motor/sensory deficits  LABORATORY DATA:  I have reviewed the data as listed    Component Value Date/Time   NA 140 10/12/2019 1213   NA 141 07/09/2017 1312   K 4.0 10/12/2019 1213   K 3.9 07/09/2017 1312   CL  103 10/12/2019 1213   CO2 29 10/12/2019 1213   CO2 27 07/09/2017 1312   GLUCOSE 87 10/12/2019 1213   GLUCOSE 109 07/09/2017 1312   BUN 23 10/12/2019 1213   BUN 16.4 07/09/2017 1312   CREATININE 0.84 10/12/2019 1213   CREATININE 0.91 01/04/2019 1351   CREATININE 0.8 07/09/2017 1312   CALCIUM 9.6 10/12/2019 1213   CALCIUM 9.2 07/09/2017 1312   PROT 7.0 10/12/2019 1213   PROT 7.1 07/09/2017 1312   ALBUMIN 3.7 10/12/2019 1213   ALBUMIN 3.1 (L) 07/09/2017 1312   AST 19 10/12/2019 1213   AST 19 01/04/2019 1351   AST 35 (H) 07/09/2017 1312   ALT 18 10/12/2019 1213   ALT 20 01/04/2019 1351   ALT 43 07/09/2017 1312   ALKPHOS 130 (H) 10/12/2019 1213   ALKPHOS 182 (H) 07/09/2017 1312   BILITOT 0.6 10/12/2019 1213   BILITOT 0.3 01/04/2019 1351   BILITOT 0.34 07/09/2017 1312   GFRNONAA >60 10/12/2019 1213   GFRNONAA >  60 01/04/2019 1351   GFRAA >60 10/12/2019 1213   GFRAA >60 01/04/2019 1351    No results found for: SPEP, UPEP  Lab Results  Component Value Date   WBC 6.8 10/12/2019   NEUTROABS 4.2 10/12/2019   HGB 12.3 10/12/2019   HCT 37.7 10/12/2019   MCV 82.9 10/12/2019   PLT 176 10/12/2019      Chemistry      Component Value Date/Time   NA 140 10/12/2019 1213   NA 141 07/09/2017 1312   K 4.0 10/12/2019 1213   K 3.9 07/09/2017 1312   CL 103 10/12/2019 1213   CO2 29 10/12/2019 1213   CO2 27 07/09/2017 1312   BUN 23 10/12/2019 1213   BUN 16.4 07/09/2017 1312   CREATININE 0.84 10/12/2019 1213   CREATININE 0.91 01/04/2019 1351   CREATININE 0.8 07/09/2017 1312      Component Value Date/Time   CALCIUM 9.6 10/12/2019 1213   CALCIUM 9.2 07/09/2017 1312   ALKPHOS 130 (H) 10/12/2019 1213   ALKPHOS 182 (H) 07/09/2017 1312   AST 19 10/12/2019 1213   AST 19 01/04/2019 1351   AST 35 (H) 07/09/2017 1312   ALT 18 10/12/2019 1213   ALT 20 01/04/2019 1351   ALT 43 07/09/2017 1312   BILITOT 0.6 10/12/2019 1213   BILITOT 0.3 01/04/2019 1351   BILITOT 0.34 07/09/2017 1312

## 2019-10-12 NOTE — Patient Instructions (Signed)
[facility] Discharge Instructions for Patients Receiving Chemotherapy  Today you received the following chemotherapy agents:  Keytruda.  To help prevent nausea and vomiting after your treatment, we encourage you to take your nausea medication as directed.   If you develop nausea and vomiting that is not controlled by your nausea medication, call the clinic.   BELOW ARE SYMPTOMS THAT SHOULD BE REPORTED IMMEDIATELY:  *FEVER GREATER THAN 100.5 F  *CHILLS WITH OR WITHOUT FEVER  NAUSEA AND VOMITING THAT IS NOT CONTROLLED WITH YOUR NAUSEA MEDICATION  *UNUSUAL SHORTNESS OF BREATH  *UNUSUAL BRUISING OR BLEEDING  TENDERNESS IN MOUTH AND THROAT WITH OR WITHOUT PRESENCE OF ULCERS  *URINARY PROBLEMS  *BOWEL PROBLEMS  UNUSUAL RASH Items with * indicate a potential emergency and should be followed up as soon as possible.  Feel free to call the clinic should you have any questions or concerns. The clinic phone number is [phone].  Please show the CHEMO ALERT CARD at check-in to the Emergency Department and triage nurse.    

## 2019-10-12 NOTE — Assessment & Plan Note (Signed)
Overall, she tolerated treatment well We will continue pembrolizumab indefinitely I do not plan to repeat imaging study again until September 2021 

## 2019-10-12 NOTE — Assessment & Plan Note (Signed)
I congratulated her efforts She has lost some weight since last time I saw her She will continue dietary modification and weight loss strategy She has made commitment to lose 5 more pounds before I see her next and I continue to encourage her effort

## 2019-10-12 NOTE — Assessment & Plan Note (Signed)
Her recent TSH is satisfactory She will continue chronic thyroid replacement therapy We will adjust her thyroid medicine as needed

## 2019-10-13 ENCOUNTER — Telehealth: Payer: Self-pay | Admitting: Hematology and Oncology

## 2019-10-13 NOTE — Telephone Encounter (Signed)
Scheduled per 4/6 sch msg. Called pt and left a msg. Mailing printout

## 2019-10-16 ENCOUNTER — Ambulatory Visit: Payer: Medicare Other | Attending: Internal Medicine

## 2019-10-16 DIAGNOSIS — Z23 Encounter for immunization: Secondary | ICD-10-CM

## 2019-10-16 NOTE — Progress Notes (Signed)
   Covid-19 Vaccination Clinic  Name:  Michelle Rosario    MRN: [id number] DOB: [birth year]  10/16/2019  Michelle Rosario was observed post Covid-19 immunization for 15 minutes without incident. She was provided with Vaccine Information Sheet and instruction to access the V-Safe system.   Michelle Rosario was instructed to call 911 with any severe reactions post vaccine: Marland Kitchen Difficulty breathing  . Swelling of face and throat  . A fast heartbeat  . A bad rash all over body  . Dizziness and weakness   Immunizations Administered    Name Date Dose VIS Date Route   Pfizer COVID-19 Vaccine 10/16/2019  1:22 PM 0.3 mL 06/18/2019 Intramuscular   Manufacturer: [employer], [employer]   Lot: [id number]   [place]: [id number]

## 2019-10-27 NOTE — Progress Notes (Signed)
Pharmacist Chemotherapy Monitoring - Follow Up Assessment    I verify that I have reviewed each item in the below checklist:  . Regimen for the patient is scheduled for the appropriate day and plan matches scheduled date. Marland Kitchen Appropriate non-routine labs are ordered dependent on drug ordered. . If applicable, additional medications reviewed and ordered per protocol based on lifetime cumulative doses and/or treatment regimen.   Plan for follow-up and/or issues identified: No . I-vent associated with next due treatment: No . MD and/or nursing notified: No  Gregorey Nabor D 10/27/2019 4:02 PM

## 2019-11-02 ENCOUNTER — Inpatient Hospital Stay: Payer: Medicare Other

## 2019-11-02 ENCOUNTER — Other Ambulatory Visit: Payer: Self-pay

## 2019-11-02 VITALS — BP 152/82 | HR 72 | Temp 98.0°F | Resp 18

## 2019-11-02 DIAGNOSIS — C541 Malignant neoplasm of endometrium: Secondary | ICD-10-CM

## 2019-11-02 DIAGNOSIS — E039 Hypothyroidism, unspecified: Secondary | ICD-10-CM

## 2019-11-02 DIAGNOSIS — Z5112 Encounter for antineoplastic immunotherapy: Secondary | ICD-10-CM | POA: Diagnosis not present

## 2019-11-02 DIAGNOSIS — C787 Secondary malignant neoplasm of liver and intrahepatic bile duct: Secondary | ICD-10-CM

## 2019-11-02 LAB — COMPREHENSIVE METABOLIC PANEL
ALT: 19 U/L (ref 0–44)
AST: 21 U/L (ref 15–41)
Albumin: 3.5 g/dL (ref 3.5–5.0)
Alkaline Phosphatase: 138 U/L — ABNORMAL HIGH (ref 38–126)
Anion gap: 10 (ref 5–15)
BUN: 20 mg/dL (ref 8–23)
CO2: 25 mmol/L (ref 22–32)
Calcium: 9.3 mg/dL (ref 8.9–10.3)
Chloride: 105 mmol/L (ref 98–111)
Creatinine, Ser: 0.96 mg/dL (ref 0.44–1.00)
GFR calc Af Amer: >60 mL/min (ref >60)
GFR calc non Af Amer: 59 mL/min — ABNORMAL LOW (ref >60)
Glucose, Bld: 106 mg/dL — ABNORMAL HIGH (ref 70–99)
Potassium: 3.4 mmol/L — ABNORMAL LOW (ref 3.5–5.1)
Sodium: 140 mmol/L (ref 135–145)
Total Bilirubin: 0.4 mg/dL (ref 0.3–1.2)
Total Protein: 6.9 g/dL (ref 6.5–8.1)

## 2019-11-02 LAB — CBC WITH DIFFERENTIAL/PLATELET
Abs Immature Granulocytes: 0.03 10*3/uL (ref 0.00–0.07)
Basophils Absolute: 0.1 10*3/uL (ref 0.0–0.1)
Basophils Relative: 1 %
Eosinophils Absolute: 0.5 10*3/uL (ref 0.0–0.5)
Eosinophils Relative: 7 %
HCT: 36.5 % (ref 36.0–46.0)
Hemoglobin: 12.1 g/dL (ref 12.0–15.0)
Immature Granulocytes: 0 %
Lymphocytes Relative: 25 %
Lymphs Abs: 1.9 10*3/uL (ref 0.7–4.0)
MCH: 27.9 pg (ref 26.0–34.0)
MCHC: 33.2 g/dL (ref 30.0–36.0)
MCV: 84.1 fL (ref 80.0–100.0)
Monocytes Absolute: 0.5 10*3/uL (ref 0.1–1.0)
Monocytes Relative: 7 %
Neutro Abs: 4.5 10*3/uL (ref 1.7–7.7)
Neutrophils Relative %: 60 %
Platelets: 208 10*3/uL (ref 150–400)
RBC: 4.34 MIL/uL (ref 3.87–5.11)
RDW: 13.2 % (ref 11.5–15.5)
WBC: 7.5 10*3/uL (ref 4.0–10.5)
nRBC: 0 % (ref 0.0–0.2)

## 2019-11-02 LAB — TSH: TSH: 0.222 u[IU]/mL — ABNORMAL LOW (ref 0.308–3.960)

## 2019-11-02 MED ORDER — HEPARIN SOD (PORK) LOCK FLUSH 100 UNIT/ML IV SOLN
500.0000 [IU] | Freq: Once | INTRAVENOUS | Status: AC | PRN
  Administered 2019-11-02: 500 [IU]
  Filled 2019-11-02: qty 5

## 2019-11-02 MED ORDER — SODIUM CHLORIDE 0.9 % IV SOLN
Freq: Once | INTRAVENOUS | Status: AC
  Filled 2019-11-02: qty 250

## 2019-11-02 MED ORDER — SODIUM CHLORIDE 0.9% FLUSH
10.0000 mL | Freq: Once | INTRAVENOUS | Status: AC
  Administered 2019-11-02: 10 mL
  Filled 2019-11-02: qty 10

## 2019-11-02 MED ORDER — SODIUM CHLORIDE 0.9 % IV SOLN
200.0000 mg | Freq: Once | INTRAVENOUS | Status: AC
  Administered 2019-11-02: 200 mg via INTRAVENOUS
  Filled 2019-11-02: qty 8

## 2019-11-02 MED ORDER — SODIUM CHLORIDE 0.9% FLUSH
10.0000 mL | INTRAVENOUS | Status: DC | PRN
  Administered 2019-11-02: 10 mL
  Filled 2019-11-02: qty 10

## 2019-11-02 NOTE — Patient Instructions (Signed)

## 2019-11-03 ENCOUNTER — Telehealth: Payer: Self-pay

## 2019-11-03 ENCOUNTER — Other Ambulatory Visit: Payer: Self-pay

## 2019-11-03 MED ORDER — LEVOTHYROXINE SODIUM 175 MCG PO TABS: 175.0000 ug | ORAL_TABLET | Freq: Every day | ORAL | 3 refills | Status: DC

## 2019-11-03 NOTE — Telephone Encounter (Signed)
-----   Message from Heath Lark, MD sent at 11/02/2019  7:27 PM EDT ----- Regarding: synthroid Her TSH is low meaning the current dose of synthroid is high I do not remember whether it was me who prescribed it If it was me, I recommend reduce to 175 mcg, ok to send prescription, 30 days, 3 refills

## 2019-11-03 NOTE — Telephone Encounter (Signed)
Called and given below message. She verbalized understanding. Rx sent to pharmacy. °

## 2019-11-10 ENCOUNTER — Ambulatory Visit: Payer: Medicare Other | Attending: Internal Medicine

## 2019-11-10 DIAGNOSIS — Z23 Encounter for immunization: Secondary | ICD-10-CM

## 2019-11-10 NOTE — Progress Notes (Signed)
   Covid-19 Vaccination Clinic  Name:  Michelle Rosario    MRN: [id number] DOB: [birth year]  11/10/2019  [name] was observed post Covid-19 immunization for 15 minutes without incident. She was provided with Vaccine Information Sheet and instruction to access the V-Safe system.   [name] was instructed to call 911 with any severe reactions post vaccine: Marland Kitchen Difficulty breathing  . Swelling of face and throat  . A fast heartbeat  . A bad rash all over body  . Dizziness and weakness   Immunizations Administered    Name Date Dose VIS Date Route   Pfizer COVID-19 Vaccine 11/10/2019  1:41 PM 0.3 mL 09/01/2018 Intramuscular   Manufacturer: [place]   Lot: [id number]   [place]: [id number]

## 2019-11-23 ENCOUNTER — Telehealth: Payer: Self-pay

## 2019-11-23 ENCOUNTER — Inpatient Hospital Stay: Payer: Medicare Other

## 2019-11-23 ENCOUNTER — Inpatient Hospital Stay (HOSPITAL_BASED_OUTPATIENT_CLINIC_OR_DEPARTMENT_OTHER): Payer: Medicare Other | Admitting: Hematology and Oncology

## 2019-11-23 ENCOUNTER — Other Ambulatory Visit: Payer: Self-pay

## 2019-11-23 ENCOUNTER — Inpatient Hospital Stay: Payer: Medicare Other | Attending: Hematology and Oncology

## 2019-11-23 ENCOUNTER — Telehealth: Payer: Self-pay | Admitting: Hematology and Oncology

## 2019-11-23 ENCOUNTER — Encounter: Payer: Self-pay | Admitting: Hematology and Oncology

## 2019-11-23 DIAGNOSIS — I10 Essential (primary) hypertension: Secondary | ICD-10-CM | POA: Insufficient documentation

## 2019-11-23 DIAGNOSIS — C787 Secondary malignant neoplasm of liver and intrahepatic bile duct: Secondary | ICD-10-CM | POA: Insufficient documentation

## 2019-11-23 DIAGNOSIS — Z79899 Other long term (current) drug therapy: Secondary | ICD-10-CM | POA: Diagnosis not present

## 2019-11-23 DIAGNOSIS — Z5112 Encounter for antineoplastic immunotherapy: Secondary | ICD-10-CM | POA: Diagnosis present

## 2019-11-23 DIAGNOSIS — C541 Malignant neoplasm of endometrium: Secondary | ICD-10-CM

## 2019-11-23 DIAGNOSIS — Z791 Long term (current) use of non-steroidal anti-inflammatories (NSAID): Secondary | ICD-10-CM | POA: Insufficient documentation

## 2019-11-23 DIAGNOSIS — E039 Hypothyroidism, unspecified: Secondary | ICD-10-CM | POA: Diagnosis not present

## 2019-11-23 LAB — CBC WITH DIFFERENTIAL/PLATELET
Abs Immature Granulocytes: 0.03 10*3/uL (ref 0.00–0.07)
Basophils Absolute: 0.1 10*3/uL (ref 0.0–0.1)
Basophils Relative: 1 %
Eosinophils Absolute: 0.4 10*3/uL (ref 0.0–0.5)
Eosinophils Relative: 7 %
HCT: 36.2 % (ref 36.0–46.0)
Hemoglobin: 11.9 g/dL — ABNORMAL LOW (ref 12.0–15.0)
Immature Granulocytes: 1 %
Lymphocytes Relative: 22 %
Lymphs Abs: 1.4 10*3/uL (ref 0.7–4.0)
MCH: 27.9 pg (ref 26.0–34.0)
MCHC: 32.9 g/dL (ref 30.0–36.0)
MCV: 84.8 fL (ref 80.0–100.0)
Monocytes Absolute: 0.5 10*3/uL (ref 0.1–1.0)
Monocytes Relative: 9 %
Neutro Abs: 3.8 10*3/uL (ref 1.7–7.7)
Neutrophils Relative %: 60 %
Platelets: 204 10*3/uL (ref 150–400)
RBC: 4.27 MIL/uL (ref 3.87–5.11)
RDW: 13.4 % (ref 11.5–15.5)
WBC: 6.2 10*3/uL (ref 4.0–10.5)
nRBC: 0 % (ref 0.0–0.2)

## 2019-11-23 LAB — COMPREHENSIVE METABOLIC PANEL
ALT: 14 U/L (ref 0–44)
AST: 17 U/L (ref 15–41)
Albumin: 3.4 g/dL — ABNORMAL LOW (ref 3.5–5.0)
Alkaline Phosphatase: 132 U/L — ABNORMAL HIGH (ref 38–126)
Anion gap: 9 (ref 5–15)
BUN: 20 mg/dL (ref 8–23)
CO2: 28 mmol/L (ref 22–32)
Calcium: 9 mg/dL (ref 8.9–10.3)
Chloride: 105 mmol/L (ref 98–111)
Creatinine, Ser: 0.82 mg/dL (ref 0.44–1.00)
GFR calc Af Amer: >60 mL/min (ref >60)
GFR calc non Af Amer: >60 mL/min (ref >60)
Glucose, Bld: 97 mg/dL (ref 70–99)
Potassium: 3.7 mmol/L (ref 3.5–5.1)
Sodium: 142 mmol/L (ref 135–145)
Total Bilirubin: 0.6 mg/dL (ref 0.3–1.2)
Total Protein: 6.7 g/dL (ref 6.5–8.1)

## 2019-11-23 LAB — TSH: TSH: 5.2 u[IU]/mL — ABNORMAL HIGH (ref 0.308–3.960)

## 2019-11-23 MED ORDER — SODIUM CHLORIDE 0.9 % IV SOLN
Freq: Once | INTRAVENOUS | Status: AC
  Filled 2019-11-23: qty 250

## 2019-11-23 MED ORDER — SODIUM CHLORIDE 0.9% FLUSH
10.0000 mL | INTRAVENOUS | Status: DC | PRN
  Administered 2019-11-23: 10 mL
  Filled 2019-11-23: qty 10

## 2019-11-23 MED ORDER — SODIUM CHLORIDE 0.9 % IV SOLN
200.0000 mg | Freq: Once | INTRAVENOUS | Status: AC
  Administered 2019-11-23: 200 mg via INTRAVENOUS
  Filled 2019-11-23: qty 8

## 2019-11-23 MED ORDER — HEPARIN SOD (PORK) LOCK FLUSH 100 UNIT/ML IV SOLN
500.0000 [IU] | Freq: Once | INTRAVENOUS | Status: AC | PRN
  Administered 2019-11-23: 500 [IU]
  Filled 2019-11-23: qty 5

## 2019-11-23 MED ORDER — SODIUM CHLORIDE 0.9% FLUSH
10.0000 mL | Freq: Once | INTRAVENOUS | Status: AC
  Administered 2019-11-23: 10 mL
  Filled 2019-11-23: qty 10

## 2019-11-23 NOTE — Telephone Encounter (Signed)
-----   Message from Heath Lark, MD sent at 11/23/2019 12:51 PM EDT ----- Regarding: synthroid dose Pls call her We reduced from 200 mcg to 175 mcg recently and now we have to adjust again My final rec: 175 mcg alternate with 200 mcg every other day

## 2019-11-23 NOTE — Telephone Encounter (Signed)
Messaged Product/process development scientist to give pt updated calendar in [place].

## 2019-11-23 NOTE — Progress Notes (Signed)
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   All questions were answered. The patient knows to call the clinic with any problems, questions or concerns. The total time spent in the appointment was 20 minutes encounter with patients including review of chart and various tests results, discussions about plan of care and coordination of care plan   Heath Lark, MD 11/23/2019 6:55 PM  INTERVAL HISTORY: Please see below for problem oriented charting.  She returns for further follow-up and treatment She is doing well She has gained some weight since last time I saw her  She denies abdominal pain or changes in bowel habits No recent infusion reactions No recent infection, fever or chills  SUMMARY OF ONCOLOGIC HISTORY: Oncology History Overview Note  MSI stable on June 2017 tissue but high from [facility] study from November 2017  05/15/16: ER is moderately positive (70%). PR is strongly positive (80%).   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  Lymph node, sentinel, biopsy, right obturator ONE BENIGN LYMPH NODE (0/1) 3. Lymph nodes, regional resection, left pelvic FOUR BENIGN LYMPH NODES (0/4)   01/02/2016 Surgery   Dr. Denman George performed robotic-assisted laparoscopic total hysterectomy with bilateral salpingoophorectomy, sentinel lymph node biopsy, lymphadenectomy     05/15/2016 Pathology Results   Vagina, biopsy, mid - ADENOCARCINOMA, SEE COMMENT. Microscopic Comment The morphology along with the patient's history are consistent with recurrent endometrioid adenocarcinoma. The carcinoma has a similar appearance to the primary ([id number]).   05/20/2016 Imaging   Ct scan abdomen showed solid 2.5 cm peritoneal mass in the mid to anterior left pelvis, suspicious for peritoneal metastasis. 2. Small expansile low-attenuation filling defect in the left external iliac vein, cannot exclude a small deep venous thrombus. Consider correlation with left lower extremity venous Doppler scan. 3. Small simple fluid density structure in the left pelvic sidewall abutting the left external iliac vessels, favor a small postoperative seroma. 4. No ascites. 5. No lymphadenopathy.  No metastatic disease in the chest. 6. Aortic atherosclerosis.   06/12/2016 PET scan   Intensely hypermetabolic 2.1 cm central pelvic peritoneal mass just to the left of midline, consistent with peritoneal metastatic recurrence. No ascites. 2. No additional hypermetabolic sites of metastatic disease. 3. Diffuse thyroid hypermetabolism without discrete thyroid nodule, favoring thyroiditis. Recommend correlation with serum thyroid function tests.   06/24/2016 - 07/22/2016 Chemotherapy   The patient had weekly cisplatin. She has missed several doses due to infection and pancytopenia   06/24/2016 - 09/04/2016 Radiation Therapy   She completed concurrent radiation therapy Radiation treatment dates:   IMRT : 06/24/16 - 08/01/16 HDR : 08/13/16, 08/20/16, 08/27/16, 09/04/16  Site/dose:    Pelvis treated to 55 Gy in  25 fractions (simultaneous integrated boost technique) Vaginal Cuff treated to 24 Gy in 4 fractions   08/15/2016 - 12/03/2016 Chemotherapy   She received 6 cycles of carboplatin/Taxol   09/27/2016 Imaging   Interval decrease in size of previously described solid peritoneal nodule within the left anterior pelvis. Near complete resolution of previously described low-attenuation structure along the left pelvic sidewall. No evidence for metastatic disease in the chest. Aortic atherosclerosis.   12/30/2016 Imaging   Ct abdomen 1. Solitary left pelvic peritoneal implant is mildly decreased in size in the interval. 2. No new or progressive metastatic disease in the abdomen or pelvis. No ascites. 3. Aortic atherosclerosis.   04/02/2017 Imaging   Left lower quadrant peritoneal implant referenced on previous exam measures 1.7 x 1.3 cm, image 69 of series 2. Increased from 0.8 x 0.8 cm previously peer no new peritoneal implants identified.  Musculoskeletal: The degenerative disc disease noted within the lumbar spine.  IMPRESSION: 1. Solitary left pelvic peritoneal implant is increased in size in the interval. 2. No new sites of disease.  No ascites. 3. Aortic atherosclerosis   04/11/2017 PET scan   1. The left side of pelvis peritoneal implant has decreased in size and degree of FDG uptake compatible with response to therapy. No new areas of peritoneal disease identified. 2. Persistent diffuse increased uptake within the thyroid gland. Correlation with patient's thyroid function may be helpful.   05/26/2017 Imaging   1. Enlarging tumor implant along the left adnexa, currently 2.6 by 2.4 cm and previously 1.9 by 1.3 cm. No new tumor implant or other specific cause for the patient's pelvic symptoms is currently identified. 2.  Aortic Atherosclerosis (ICD10-I70.0). 3. Lumbar spondylosis and degenerative disc disease causing multilevel impingement.   06/04/2017 -  08/28/2017 Chemotherapy   The patient started letrozole and everolimus   08/26/2017 Imaging   Increased size of mass in the left adnexal region.  Several new small liver metastases in right hepatic lobe   09/08/2017 Imaging   LV EF: 60% -  65%   09/10/2017 Procedure   Technically successful right IJ power-injectable port catheter placement. Ready for routine use   12/04/2017 Imaging   1. Interval increase in size and number of multiple lesions within the liver compatible with hepatic metastatic disease. 2. Interval increase in size mass within the left hemipelvis.   12/15/2017 -  Chemotherapy   The patient had pembrolizumab (KEYTRUDA) 200 mg in sodium chloride 0.9 % 50 mL chemo infusion, 200 mg, Intravenous, Once, 1 of 6 cycles Administration: 200 mg (12/15/2017)  for chemotherapy treatment.    12/21/2017 Imaging   1. Moderate left hydronephrosis and hydroureter, similar compared to most recent CT from May 2019; obstruction appears to be secondary to a left pelvic mass/metastatic focus which has increased in size since the prior CT. 2. Numerous hepatic metastatic lesions, suspect that some may be increased in size but difficult to further characterize without intravenous contrast. Increased size of left pelvic soft tissue mass/metastatic lesion. Mass abuts and possibly invades the sigmoid colon.    02/20/2018 Imaging   Interval decrease in hepatic metastases.  Decreased mass or lymphadenopathy in the left external iliac chain. Interval resolution of left hydroureteronephrosis.  No new or progressive disease within the abdomen or pelvis.   05/16/2018 Imaging   05/16/2018 CT Abdomen IMPRESSION: 1. Slight interval decrease in size of hepatic metastatic disease. 2. Slight interval decrease in size centrally necrotic mass within the left external iliac region.   09/18/2018 Imaging  1. Today's study demonstrates a mixed response to therapy. Specifically, while the previously noted hepatic  metastases appear decreased, the soft tissue mass along the left pelvic sidewall appears slightly larger than the prior study. No new metastatic disease is noted elsewhere in the abdomen or pelvis. 2. Aortic atherosclerosis. 3. Mild cardiomegaly. 4. Additional incidental findings, as above    03/12/2019 Imaging   1. Overall improvement with reduced size of the hepatic metastatic lesions, and reduced size of the irregular soft tissue mass in the left pelvis. 2. Other imaging findings of potential clinical significance: Mild distal esophageal wall thickening, cannot exclude esophagitis. Aortic Atherosclerosis (ICD10-I70.0). Lumbar impingement at L5-S1.     09/09/2019 Imaging   1. Stable size and appearance of the somewhat irregular left adnexal mass, currently 2.8 by 1.9 cm. 2. The scattered hepatic metastatic lesions are stable from 03/12/2019, and represent effectively treated metastatic lesions. 3. Other imaging findings of potential clinical significance: Airway thickening is present, suggesting bronchitis or reactive airways disease. Lumbar spondylosis and degenerative disc disease causing impingement at L4-5 and L5-S1. Small umbilical hernia contains adipose tissue.   Aortic Atherosclerosis (ICD10-I70.0).     REVIEW OF SYSTEMS:   Constitutional: Denies fevers, chills or abnormal weight loss Eyes: Denies blurriness of vision Ears, nose, mouth, throat, and face: Denies mucositis or sore throat Respiratory: Denies cough, dyspnea or wheezes Cardiovascular: Denies palpitation, chest discomfort or lower extremity swelling Gastrointestinal:  Denies nausea, heartburn or change in bowel habits Skin: Denies abnormal skin rashes Lymphatics: Denies new lymphadenopathy or easy bruising Neurological:Denies numbness, tingling or new weaknesses Behavioral/Psych: Mood is stable, no new changes  All other systems were reviewed with the patient and are negative.  I have reviewed the past medical  history, past surgical history, social history and family history with the patient and they are unchanged from previous note.  ALLERGIES:  is allergic to latex.  MEDICATIONS:  Current Outpatient Medications  Medication Sig Dispense Refill  . acetaminophen (TYLENOL) 325 MG tablet Take 650 mg by mouth every 6 (six) hours as needed for mild pain or moderate pain.     Marland Kitchen amoxicillin (AMOXIL) 500 MG capsule Take 2,000 mg by mouth See admin instructions. Prior to dental appointment, takes 4 tables prior to appt    . Biotin w/ Vitamins C & E (HAIR/SKIN/NAILS PO) Take 1 tablet by mouth daily.    . Calcium Carb-Cholecalciferol (CALCIUM 600 + D PO) Take 2 tablets by mouth daily.    . fluticasone (FLONASE) 50 MCG/ACT nasal spray Place 1 spray into both nostrils 2 (two) times daily as needed for allergies.   1  . gabapentin (NEURONTIN) 600 MG tablet TAKE 1 TABLET BY MOUTH TWICE A DAY FOR RESTLESS LEGS AND NEUROPATHY.    Marland Kitchen HYDROcodone-acetaminophen (NORCO) 10-325 MG tablet Take 1 tablet by mouth every 6 (six) hours as needed. 30 tablet 0  . ibuprofen (ADVIL,MOTRIN) 200 MG tablet Take 400 mg by mouth every 8 (eight) hours as needed for mild pain.    Marland Kitchen levothyroxine (SYNTHROID) 175 MCG tablet Take 1 tablet (175 mcg total) by mouth daily before breakfast. (Patient taking differently: Take 175 mcg by mouth every other day. Take before breakfast every other day. Alternate with Synthroid 200 mcg.) 30 tablet 3  . levothyroxine (SYNTHROID) 200 MCG tablet Take 200 mcg by mouth every other day. Take every other day before breakfast/ alternate with Synthroid 175 mcg    . lidocaine-prilocaine (EMLA) cream APPLY TO AFFECTED AREA ONCE 30 g 3  .  loratadine (CLARITIN) 10 MG tablet Take 10 mg by mouth daily.    Marland Kitchen LORazepam (ATIVAN) 1 MG tablet Take 1 tablet (1 mg total) by mouth every 8 (eight) hours as needed for anxiety or sleep. 90 tablet 3  . losartan-hydrochlorothiazide (HYZAAR) 100-12.5 MG tablet Take 0.5 tablets by  mouth daily. 60 tablet 9  . magnesium oxide (MAG-OX) 400 MG tablet Take 1 tablet by mouth 2 (two) times daily.    . Melatonin 10 MG CAPS Take 10 mg by mouth at bedtime.    . niacinamide 500 MG tablet Take 500 mg by mouth 3 (three) times daily with meals.    . ondansetron (ZOFRAN) 8 MG tablet Take 1 tablet (8 mg total) by mouth every 8 (eight) hours as needed for nausea. 60 tablet 1  . potassium gluconate 595 (99 K) MG TABS tablet Take by mouth.    . pramipexole (MIRAPEX) 0.5 MG tablet Take 1 mg by mouth at bedtime.    . traZODone (DESYREL) 50 MG tablet Take 1.5 tablets (75 mg total) by mouth at bedtime. (Patient taking differently: Take 50 mg by mouth at bedtime. ) 135 tablet 1   No current facility-administered medications for this visit.    PHYSICAL EXAMINATION: ECOG PERFORMANCE STATUS: 0 - Asymptomatic  Vitals:   11/23/19 1151  BP: (!) 157/84  Pulse: 78  Resp: 18  Temp: 98.5 F (36.9 C)  SpO2: 99%   Filed Weights   11/23/19 1151  Weight: 218 lb 8 oz (99.1 kg)    GENERAL:alert, no distress and comfortable SKIN: skin color, texture, turgor are normal, no rashes or significant lesions EYES: normal, Conjunctiva are pink and non-injected, sclera clear OROPHARYNX:no exudate, no erythema and lips, buccal mucosa, and tongue normal  NECK: supple, thyroid normal size, non-tender, without nodularity LYMPH:  no palpable lymphadenopathy in the cervical, axillary or inguinal LUNGS: clear to auscultation and percussion with normal breathing effort HEART: regular rate & rhythm and no murmurs and no lower extremity edema ABDOMEN:abdomen soft, non-tender and normal bowel sounds Musculoskeletal:no cyanosis of digits and no clubbing  NEURO: alert & oriented x 3 with fluent speech, no focal motor/sensory deficits  LABORATORY DATA:  I have reviewed the data as listed    Component Value Date/Time   NA 142 11/23/2019 1105   NA 141 07/09/2017 1312   K 3.7 11/23/2019 1105   K 3.9 07/09/2017  1312   CL 105 11/23/2019 1105   CO2 28 11/23/2019 1105   CO2 27 07/09/2017 1312   GLUCOSE 97 11/23/2019 1105   GLUCOSE 109 07/09/2017 1312   BUN 20 11/23/2019 1105   BUN 16.4 07/09/2017 1312   CREATININE 0.82 11/23/2019 1105   CREATININE 0.91 01/04/2019 1351   CREATININE 0.8 07/09/2017 1312   CALCIUM 9.0 11/23/2019 1105   CALCIUM 9.2 07/09/2017 1312   PROT 6.7 11/23/2019 1105   PROT 7.1 07/09/2017 1312   ALBUMIN 3.4 (L) 11/23/2019 1105   ALBUMIN 3.1 (L) 07/09/2017 1312   AST 17 11/23/2019 1105   AST 19 01/04/2019 1351   AST 35 (H) 07/09/2017 1312   ALT 14 11/23/2019 1105   ALT 20 01/04/2019 1351   ALT 43 07/09/2017 1312   ALKPHOS 132 (H) 11/23/2019 1105   ALKPHOS 182 (H) 07/09/2017 1312   BILITOT 0.6 11/23/2019 1105   BILITOT 0.3 01/04/2019 1351   BILITOT 0.34 07/09/2017 1312   GFRNONAA >60 11/23/2019 1105   GFRNONAA >60 01/04/2019 1351   GFRAA >60 11/23/2019 1105  GFRAA >60 01/04/2019 1351    No results found for: SPEP, UPEP  Lab Results  Component Value Date   WBC 6.2 11/23/2019   NEUTROABS 3.8 11/23/2019   HGB 11.9 (L) 11/23/2019   HCT 36.2 11/23/2019   MCV 84.8 11/23/2019   PLT 204 11/23/2019      Chemistry      Component Value Date/Time   NA 142 11/23/2019 1105   NA 141 07/09/2017 1312   K 3.7 11/23/2019 1105   K 3.9 07/09/2017 1312   CL 105 11/23/2019 1105   CO2 28 11/23/2019 1105   CO2 27 07/09/2017 1312   BUN 20 11/23/2019 1105   BUN 16.4 07/09/2017 1312   CREATININE 0.82 11/23/2019 1105   CREATININE 0.91 01/04/2019 1351   CREATININE 0.8 07/09/2017 1312      Component Value Date/Time   CALCIUM 9.0 11/23/2019 1105   CALCIUM 9.2 07/09/2017 1312   ALKPHOS 132 (H) 11/23/2019 1105   ALKPHOS 182 (H) 07/09/2017 1312   AST 17 11/23/2019 1105   AST 19 01/04/2019 1351   AST 35 (H) 07/09/2017 1312   ALT 14 11/23/2019 1105   ALT 20 01/04/2019 1351   ALT 43 07/09/2017 1312   BILITOT 0.6 11/23/2019 1105   BILITOT 0.3 01/04/2019 1351   BILITOT 0.34  07/09/2017 1312

## 2019-11-23 NOTE — Patient Instructions (Signed)
[facility] Discharge Instructions for Patients Receiving Chemotherapy  Today you received the following chemotherapy agents:  Keytruda.  To help prevent nausea and vomiting after your treatment, we encourage you to take your nausea medication as directed.   If you develop nausea and vomiting that is not controlled by your nausea medication, call the clinic.   BELOW ARE SYMPTOMS THAT SHOULD BE REPORTED IMMEDIATELY:  *FEVER GREATER THAN 100.5 F  *CHILLS WITH OR WITHOUT FEVER  NAUSEA AND VOMITING THAT IS NOT CONTROLLED WITH YOUR NAUSEA MEDICATION  *UNUSUAL SHORTNESS OF BREATH  *UNUSUAL BRUISING OR BLEEDING  TENDERNESS IN MOUTH AND THROAT WITH OR WITHOUT PRESENCE OF ULCERS  *URINARY PROBLEMS  *BOWEL PROBLEMS  UNUSUAL RASH Items with * indicate a potential emergency and should be followed up as soon as possible.  Feel free to call the clinic should you have any questions or concerns. The clinic phone number is [phone].  Please show the CHEMO ALERT CARD at check-in to the Emergency Department and triage nurse.    

## 2019-11-23 NOTE — Assessment & Plan Note (Addendum)
Her recent TSH is intermittently elevated She will continue chronic thyroid replacement therapy We will adjust her thyroid medicine as needed 

## 2019-11-23 NOTE — Assessment & Plan Note (Signed)
Overall, she tolerated treatment well We will continue pembrolizumab indefinitely I do not plan to repeat imaging study again until September 2021 

## 2019-11-23 NOTE — Telephone Encounter (Signed)
Called and left below message. Ask her to call the office back. 

## 2019-11-23 NOTE — Telephone Encounter (Signed)
Called back and given below message. She verbalized understanding. She does not need Rx at this time. Medication list updated.

## 2019-11-23 NOTE — Assessment & Plan Note (Signed)
We discussed the importance of dietary changes and weight loss

## 2019-11-23 NOTE — Telephone Encounter (Signed)
Called and left below message asking her to call the office back. 

## 2019-12-14 ENCOUNTER — Inpatient Hospital Stay: Payer: Medicare Other

## 2019-12-14 ENCOUNTER — Inpatient Hospital Stay: Payer: Medicare Other | Attending: Hematology and Oncology

## 2019-12-14 ENCOUNTER — Other Ambulatory Visit: Payer: Self-pay

## 2019-12-14 VITALS — BP 161/86 | HR 69 | Temp 98.0°F | Resp 16

## 2019-12-14 DIAGNOSIS — Z923 Personal history of irradiation: Secondary | ICD-10-CM | POA: Diagnosis not present

## 2019-12-14 DIAGNOSIS — E039 Hypothyroidism, unspecified: Secondary | ICD-10-CM

## 2019-12-14 DIAGNOSIS — Z5112 Encounter for antineoplastic immunotherapy: Secondary | ICD-10-CM | POA: Diagnosis present

## 2019-12-14 DIAGNOSIS — C541 Malignant neoplasm of endometrium: Secondary | ICD-10-CM

## 2019-12-14 DIAGNOSIS — Z79899 Other long term (current) drug therapy: Secondary | ICD-10-CM | POA: Insufficient documentation

## 2019-12-14 DIAGNOSIS — C787 Secondary malignant neoplasm of liver and intrahepatic bile duct: Secondary | ICD-10-CM

## 2019-12-14 DIAGNOSIS — Z9221 Personal history of antineoplastic chemotherapy: Secondary | ICD-10-CM | POA: Insufficient documentation

## 2019-12-14 LAB — CBC WITH DIFFERENTIAL/PLATELET
Abs Immature Granulocytes: 0.02 10*3/uL (ref 0.00–0.07)
Basophils Absolute: 0.1 10*3/uL (ref 0.0–0.1)
Basophils Relative: 1 %
Eosinophils Absolute: 0.5 10*3/uL (ref 0.0–0.5)
Eosinophils Relative: 7 %
HCT: 37.6 % (ref 36.0–46.0)
Hemoglobin: 12.4 g/dL (ref 12.0–15.0)
Immature Granulocytes: 0 %
Lymphocytes Relative: 22 %
Lymphs Abs: 1.6 10*3/uL (ref 0.7–4.0)
MCH: 27.5 pg (ref 26.0–34.0)
MCHC: 33 g/dL (ref 30.0–36.0)
MCV: 83.4 fL (ref 80.0–100.0)
Monocytes Absolute: 0.6 10*3/uL (ref 0.1–1.0)
Monocytes Relative: 8 %
Neutro Abs: 4.6 10*3/uL (ref 1.7–7.7)
Neutrophils Relative %: 62 %
Platelets: 200 10*3/uL (ref 150–400)
RBC: 4.51 MIL/uL (ref 3.87–5.11)
RDW: 13.2 % (ref 11.5–15.5)
WBC: 7.4 10*3/uL (ref 4.0–10.5)
nRBC: 0 % (ref 0.0–0.2)

## 2019-12-14 LAB — COMPREHENSIVE METABOLIC PANEL
ALT: 13 U/L (ref 0–44)
AST: 16 U/L (ref 15–41)
Albumin: 3.6 g/dL (ref 3.5–5.0)
Alkaline Phosphatase: 128 U/L — ABNORMAL HIGH (ref 38–126)
Anion gap: 10 (ref 5–15)
BUN: 19 mg/dL (ref 8–23)
CO2: 26 mmol/L (ref 22–32)
Calcium: 9.5 mg/dL (ref 8.9–10.3)
Chloride: 103 mmol/L (ref 98–111)
Creatinine, Ser: 0.87 mg/dL (ref 0.44–1.00)
GFR calc Af Amer: >60 mL/min (ref >60)
GFR calc non Af Amer: >60 mL/min (ref >60)
Glucose, Bld: 86 mg/dL (ref 70–99)
Potassium: 3.5 mmol/L (ref 3.5–5.1)
Sodium: 139 mmol/L (ref 135–145)
Total Bilirubin: 0.6 mg/dL (ref 0.3–1.2)
Total Protein: 7.1 g/dL (ref 6.5–8.1)

## 2019-12-14 LAB — TSH: TSH: 3.548 u[IU]/mL (ref 0.308–3.960)

## 2019-12-14 MED ORDER — SODIUM CHLORIDE 0.9 % IV SOLN
200.0000 mg | Freq: Once | INTRAVENOUS | Status: AC
  Administered 2019-12-14: 200 mg via INTRAVENOUS
  Filled 2019-12-14: qty 8

## 2019-12-14 MED ORDER — HEPARIN SOD (PORK) LOCK FLUSH 100 UNIT/ML IV SOLN
500.0000 [IU] | Freq: Once | INTRAVENOUS | Status: AC | PRN
  Administered 2019-12-14: 500 [IU]
  Filled 2019-12-14: qty 5

## 2019-12-14 MED ORDER — SODIUM CHLORIDE 0.9 % IV SOLN
Freq: Once | INTRAVENOUS | Status: AC
  Filled 2019-12-14: qty 250

## 2019-12-14 MED ORDER — SODIUM CHLORIDE 0.9% FLUSH
10.0000 mL | Freq: Once | INTRAVENOUS | Status: AC
  Administered 2019-12-14: 10 mL
  Filled 2019-12-14: qty 10

## 2019-12-14 MED ORDER — SODIUM CHLORIDE 0.9% FLUSH
10.0000 mL | INTRAVENOUS | Status: DC | PRN
  Administered 2019-12-14: 10 mL
  Filled 2019-12-14: qty 10

## 2019-12-14 NOTE — Patient Instructions (Signed)
[facility] Discharge Instructions for Patients Receiving Chemotherapy  Today you received the following chemotherapy agents:  Keytruda.  To help prevent nausea and vomiting after your treatment, we encourage you to take your nausea medication as directed.   If you develop nausea and vomiting that is not controlled by your nausea medication, call the clinic.   BELOW ARE SYMPTOMS THAT SHOULD BE REPORTED IMMEDIATELY:  *FEVER GREATER THAN 100.5 F  *CHILLS WITH OR WITHOUT FEVER  NAUSEA AND VOMITING THAT IS NOT CONTROLLED WITH YOUR NAUSEA MEDICATION  *UNUSUAL SHORTNESS OF BREATH  *UNUSUAL BRUISING OR BLEEDING  TENDERNESS IN MOUTH AND THROAT WITH OR WITHOUT PRESENCE OF ULCERS  *URINARY PROBLEMS  *BOWEL PROBLEMS  UNUSUAL RASH Items with * indicate a potential emergency and should be followed up as soon as possible.  Feel free to call the clinic should you have any questions or concerns. The clinic phone number is [phone].  Please show the CHEMO ALERT CARD at check-in to the Emergency Department and triage nurse.    

## 2019-12-18 ENCOUNTER — Other Ambulatory Visit: Payer: Self-pay | Admitting: Hematology and Oncology

## 2019-12-20 NOTE — Telephone Encounter (Signed)
She does need the Rx.

## 2019-12-20 NOTE — Telephone Encounter (Signed)
Can you call and ask if she truly needs refill of lorazepam?

## 2020-01-04 ENCOUNTER — Inpatient Hospital Stay: Payer: Medicare Other

## 2020-01-04 ENCOUNTER — Inpatient Hospital Stay (HOSPITAL_BASED_OUTPATIENT_CLINIC_OR_DEPARTMENT_OTHER): Payer: Medicare Other | Admitting: Hematology and Oncology

## 2020-01-04 ENCOUNTER — Other Ambulatory Visit: Payer: Self-pay | Admitting: Hematology and Oncology

## 2020-01-04 ENCOUNTER — Encounter: Payer: Self-pay | Admitting: Hematology and Oncology

## 2020-01-04 ENCOUNTER — Other Ambulatory Visit: Payer: Self-pay

## 2020-01-04 DIAGNOSIS — M79671 Pain in right foot: Secondary | ICD-10-CM | POA: Diagnosis not present

## 2020-01-04 DIAGNOSIS — C541 Malignant neoplasm of endometrium: Secondary | ICD-10-CM

## 2020-01-04 DIAGNOSIS — C787 Secondary malignant neoplasm of liver and intrahepatic bile duct: Secondary | ICD-10-CM

## 2020-01-04 DIAGNOSIS — Z5112 Encounter for antineoplastic immunotherapy: Secondary | ICD-10-CM | POA: Diagnosis not present

## 2020-01-04 DIAGNOSIS — E039 Hypothyroidism, unspecified: Secondary | ICD-10-CM | POA: Diagnosis not present

## 2020-01-04 LAB — COMPREHENSIVE METABOLIC PANEL
ALT: 13 U/L (ref 0–44)
AST: 15 U/L (ref 15–41)
Albumin: 3.5 g/dL (ref 3.5–5.0)
Alkaline Phosphatase: 119 U/L (ref 38–126)
Anion gap: 8 (ref 5–15)
BUN: 16 mg/dL (ref 8–23)
CO2: 27 mmol/L (ref 22–32)
Calcium: 9.4 mg/dL (ref 8.9–10.3)
Chloride: 105 mmol/L (ref 98–111)
Creatinine, Ser: 0.81 mg/dL (ref 0.44–1.00)
GFR calc Af Amer: >60 mL/min (ref >60)
GFR calc non Af Amer: >60 mL/min (ref >60)
Glucose, Bld: 97 mg/dL (ref 70–99)
Potassium: 3.6 mmol/L (ref 3.5–5.1)
Sodium: 140 mmol/L (ref 135–145)
Total Bilirubin: 0.5 mg/dL (ref 0.3–1.2)
Total Protein: 6.8 g/dL (ref 6.5–8.1)

## 2020-01-04 LAB — CBC WITH DIFFERENTIAL/PLATELET
Abs Immature Granulocytes: 0.02 10*3/uL (ref 0.00–0.07)
Basophils Absolute: 0 10*3/uL (ref 0.0–0.1)
Basophils Relative: 1 %
Eosinophils Absolute: 0.8 10*3/uL — ABNORMAL HIGH (ref 0.0–0.5)
Eosinophils Relative: 11 %
HCT: 37.1 % (ref 36.0–46.0)
Hemoglobin: 12 g/dL (ref 12.0–15.0)
Immature Granulocytes: 0 %
Lymphocytes Relative: 20 %
Lymphs Abs: 1.5 10*3/uL (ref 0.7–4.0)
MCH: 26.7 pg (ref 26.0–34.0)
MCHC: 32.3 g/dL (ref 30.0–36.0)
MCV: 82.4 fL (ref 80.0–100.0)
Monocytes Absolute: 0.5 10*3/uL (ref 0.1–1.0)
Monocytes Relative: 7 %
Neutro Abs: 4.6 10*3/uL (ref 1.7–7.7)
Neutrophils Relative %: 61 %
Platelets: 186 10*3/uL (ref 150–400)
RBC: 4.5 MIL/uL (ref 3.87–5.11)
RDW: 13.1 % (ref 11.5–15.5)
WBC: 7.5 10*3/uL (ref 4.0–10.5)
nRBC: 0 % (ref 0.0–0.2)

## 2020-01-04 MED ORDER — HYDROCODONE-ACETAMINOPHEN 10-325 MG PO TABS: 1.0000 | ORAL_TABLET | Freq: Four times a day (QID) | ORAL | 0 refills | Status: DC | PRN

## 2020-01-04 MED ORDER — HEPARIN SOD (PORK) LOCK FLUSH 100 UNIT/ML IV SOLN
500.0000 [IU] | Freq: Once | INTRAVENOUS | Status: AC | PRN
  Administered 2020-01-04: 500 [IU]
  Filled 2020-01-04: qty 5

## 2020-01-04 MED ORDER — SODIUM CHLORIDE 0.9 % IV SOLN
200.0000 mg | Freq: Once | INTRAVENOUS | Status: AC
  Administered 2020-01-04: 200 mg via INTRAVENOUS
  Filled 2020-01-04: qty 8

## 2020-01-04 MED ORDER — SODIUM CHLORIDE 0.9 % IV SOLN
Freq: Once | INTRAVENOUS | Status: AC
  Filled 2020-01-04: qty 250

## 2020-01-04 MED ORDER — SODIUM CHLORIDE 0.9% FLUSH
10.0000 mL | Freq: Once | INTRAVENOUS | Status: AC
  Administered 2020-01-04: 10 mL
  Filled 2020-01-04: qty 10

## 2020-01-04 MED ORDER — SODIUM CHLORIDE 0.9% FLUSH
10.0000 mL | INTRAVENOUS | Status: DC | PRN
  Administered 2020-01-04: 10 mL
  Filled 2020-01-04: qty 10

## 2020-01-04 NOTE — Assessment & Plan Note (Signed)
Overall, she tolerated treatment well We will continue pembrolizumab indefinitely I do not plan to repeat imaging study again until September 2021

## 2020-01-04 NOTE — Patient Instructions (Signed)
[facility] Discharge Instructions for Patients Receiving Chemotherapy  Today you received the following chemotherapy agents:  Keytruda.  To help prevent nausea and vomiting after your treatment, we encourage you to take your nausea medication as directed.   If you develop nausea and vomiting that is not controlled by your nausea medication, call the clinic.   BELOW ARE SYMPTOMS THAT SHOULD BE REPORTED IMMEDIATELY:  *FEVER GREATER THAN 100.5 F  *CHILLS WITH OR WITHOUT FEVER  NAUSEA AND VOMITING THAT IS NOT CONTROLLED WITH YOUR NAUSEA MEDICATION  *UNUSUAL SHORTNESS OF BREATH  *UNUSUAL BRUISING OR BLEEDING  TENDERNESS IN MOUTH AND THROAT WITH OR WITHOUT PRESENCE OF ULCERS  *URINARY PROBLEMS  *BOWEL PROBLEMS  UNUSUAL RASH Items with * indicate a potential emergency and should be followed up as soon as possible.  Feel free to call the clinic should you have any questions or concerns. The clinic phone number is [phone].  Please show the CHEMO ALERT CARD at check-in to the Emergency Department and triage nurse.    

## 2020-01-04 NOTE — Assessment & Plan Note (Signed)
The liver lesions are stable We will observe closely

## 2020-01-04 NOTE — Assessment & Plan Note (Signed)
Her recent TSH is intermittently elevated She will continue chronic thyroid replacement therapy We will adjust her thyroid medicine as needed 

## 2020-01-04 NOTE — Progress Notes (Signed)
[place] OFFICE PROGRESS NOTE  Patient Care Team: [name], MD as PCP - General (Family Medicine)  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[place]) Overall, she tolerated treatment well We will continue pembrolizumab indefinitely I do not plan to repeat imaging study again until September 2021  Acquired hypothyroidism Her recent TSH is intermittently elevated She will continue chronic thyroid replacement therapy We will adjust her thyroid medicine as needed  Metastasis to liver [facility]) The liver lesions are stable We will observe closely  Right foot pain She has appointment to see podiatrist I gave her a small prescription of pain medicine to take as needed I warned her about risk of nausea, sedation and constipation   No orders of the defined types were placed in this encounter.   All questions were answered. The patient knows to call the clinic with any problems, questions or concerns. The total time spent in the appointment was 20 minutes encounter with patients including review of chart and various tests results, discussions about plan of care and coordination of care plan   Heath Lark, MD 01/04/2020 10:51 AM  INTERVAL HISTORY: Please see below for problem oriented charting. She returns for treatment and follow-up She just returned from the beach She has some acute flare of arch support problem on her right foot causing significant pain limiting her mobility She denies side effects from treatment so far No recent infection, fever or chills No infusion reactions Denies abdominal pain, nausea or changes in bowel habits  SUMMARY OF ONCOLOGIC HISTORY: Oncology History Overview Note  MSI stable on June 2017 tissue but high from [facility] study from November 2017  05/15/16: ER is moderately positive (70%). PR is strongly positive (80%).   Recurrent carcinoma of endometrium ([place])  01/02/2016 Pathology Results   Uterus +/-  tubes/ovaries, neoplastic, cervix ENDOMETRIAL ADENOCARCINOMA, FIGO GRADE 2 (4.7 CM) THE TUMOR INVADES LESS THAN ONE-HALF OF THE MYOMETRIUM (PT1A) ALL MARGINS OF RESECTION ARE NEGATIVE FOR CARCINOMA LEIOMYOMAS AND ADENOMYOSIS BILATERAL FALLOPIAN TUBES AND OVARIES: HISTOLOGICAL UNREMARKABLE 2. Lymph node, sentinel, biopsy, right obturator ONE BENIGN LYMPH NODE (0/1) 3. Lymph nodes, regional resection, left pelvic FOUR BENIGN LYMPH NODES (0/4)   01/02/2016 Surgery   Dr. Denman George performed robotic-assisted laparoscopic total hysterectomy with bilateral salpingoophorectomy, sentinel lymph node biopsy, lymphadenectomy     05/15/2016 Pathology Results   Vagina, biopsy, mid - ADENOCARCINOMA, SEE COMMENT. Microscopic Comment The morphology along with the patient's history are consistent with recurrent endometrioid adenocarcinoma. The carcinoma has a similar appearance to the primary ([id number]).   05/20/2016 Imaging   Ct scan abdomen showed solid 2.5 cm peritoneal mass in the mid to anterior left pelvis, suspicious for peritoneal metastasis. 2. Small expansile low-attenuation filling defect in the left external iliac vein, cannot exclude a small deep venous thrombus. Consider correlation with left lower extremity venous Doppler scan. 3. Small simple fluid density structure in the left pelvic sidewall abutting the left external iliac vessels, favor a small postoperative seroma. 4. No ascites. 5. No lymphadenopathy.  No metastatic disease in the chest. 6. Aortic atherosclerosis.   06/12/2016 PET scan   Intensely hypermetabolic 2.1 cm central pelvic peritoneal mass just to the left of midline, consistent with peritoneal metastatic recurrence. No ascites. 2. No additional hypermetabolic sites of metastatic disease. 3. Diffuse thyroid hypermetabolism without discrete thyroid nodule, favoring thyroiditis. Recommend correlation with serum thyroid function tests.   06/24/2016 - 07/22/2016 Chemotherapy   The  patient had weekly cisplatin. She has  missed several doses due to infection and pancytopenia   06/24/2016 - 09/04/2016 Radiation Therapy   She completed concurrent radiation therapy Radiation treatment dates:   IMRT : 06/24/16 - 08/01/16 HDR : 08/13/16, 08/20/16, 08/27/16, 09/04/16  Site/dose:   Pelvis treated to 55 Gy in 25 fractions (simultaneous integrated boost technique) Vaginal Cuff treated to 24 Gy in 4 fractions   08/15/2016 - 12/03/2016 Chemotherapy   She received 6 cycles of carboplatin/Taxol   09/27/2016 Imaging   Interval decrease in size of previously described solid peritoneal nodule within the left anterior pelvis. Near complete resolution of previously described low-attenuation structure along the left pelvic sidewall. No evidence for metastatic disease in the chest. Aortic atherosclerosis.   12/30/2016 Imaging   Ct abdomen 1. Solitary left pelvic peritoneal implant is mildly decreased in size in the interval. 2. No new or progressive metastatic disease in the abdomen or pelvis. No ascites. 3. Aortic atherosclerosis.   04/02/2017 Imaging   Left lower quadrant peritoneal implant referenced on previous exam measures 1.7 x 1.3 cm, image 69 of series 2. Increased from 0.8 x 0.8 cm previously peer no new peritoneal implants identified.  Musculoskeletal: The degenerative disc disease noted within the lumbar spine.  IMPRESSION: 1. Solitary left pelvic peritoneal implant is increased in size in the interval. 2. No new sites of disease.  No ascites. 3. Aortic atherosclerosis   04/11/2017 PET scan   1. The left side of pelvis peritoneal implant has decreased in size and degree of FDG uptake compatible with response to therapy. No new areas of peritoneal disease identified. 2. Persistent diffuse increased uptake within the thyroid gland. Correlation with patient's thyroid function may be helpful.   05/26/2017 Imaging   1. Enlarging tumor implant along the left adnexa, currently 2.6  by 2.4 cm and previously 1.9 by 1.3 cm. No new tumor implant or other specific cause for the patient's pelvic symptoms is currently identified. 2.  Aortic Atherosclerosis (ICD10-I70.0). 3. Lumbar spondylosis and degenerative disc disease causing multilevel impingement.   06/04/2017 - 08/28/2017 Chemotherapy   The patient started letrozole and everolimus   08/26/2017 Imaging   Increased size of mass in the left adnexal region.  Several new small liver metastases in right hepatic lobe   09/08/2017 Imaging   LV EF: 60% -  65%   09/10/2017 Procedure   Technically successful right IJ power-injectable port catheter placement. Ready for routine use   12/04/2017 Imaging   1. Interval increase in size and number of multiple lesions within the liver compatible with hepatic metastatic disease. 2. Interval increase in size mass within the left hemipelvis.   12/15/2017 -  Chemotherapy   The patient had pembrolizumab (KEYTRUDA) 200 mg in sodium chloride 0.9 % 50 mL chemo infusion, 200 mg, Intravenous, Once, 1 of 6 cycles Administration: 200 mg (12/15/2017)  for chemotherapy treatment.    12/21/2017 Imaging   1. Moderate left hydronephrosis and hydroureter, similar compared to most recent CT from May 2019; obstruction appears to be secondary to a left pelvic mass/metastatic focus which has increased in size since the prior CT. 2. Numerous hepatic metastatic lesions, suspect that some may be increased in size but difficult to further characterize without intravenous contrast. Increased size of left pelvic soft tissue mass/metastatic lesion. Mass abuts and possibly invades the sigmoid colon.    02/20/2018 Imaging   Interval decrease in hepatic metastases.  Decreased mass or lymphadenopathy in the left external iliac chain. Interval resolution of left hydroureteronephrosis.  No new  or progressive disease within the abdomen or pelvis.   05/16/2018 Imaging   05/16/2018 CT Abdomen IMPRESSION: 1. Slight  interval decrease in size of hepatic metastatic disease. 2. Slight interval decrease in size centrally necrotic mass within the left external iliac region.   09/18/2018 Imaging   1. Today's study demonstrates a mixed response to therapy. Specifically, while the previously noted hepatic metastases appear decreased, the soft tissue mass along the left pelvic sidewall appears slightly larger than the prior study. No new metastatic disease is noted elsewhere in the abdomen or pelvis. 2. Aortic atherosclerosis. 3. Mild cardiomegaly. 4. Additional incidental findings, as above    03/12/2019 Imaging   1. Overall improvement with reduced size of the hepatic metastatic lesions, and reduced size of the irregular soft tissue mass in the left pelvis. 2. Other imaging findings of potential clinical significance: Mild distal esophageal wall thickening, cannot exclude esophagitis. Aortic Atherosclerosis (ICD10-I70.0). Lumbar impingement at L5-S1.     09/09/2019 Imaging   1. Stable size and appearance of the somewhat irregular left adnexal mass, currently 2.8 by 1.9 cm. 2. The scattered hepatic metastatic lesions are stable from 03/12/2019, and represent effectively treated metastatic lesions. 3. Other imaging findings of potential clinical significance: Airway thickening is present, suggesting bronchitis or reactive airways disease. Lumbar spondylosis and degenerative disc disease causing impingement at L4-5 and L5-S1. Small umbilical hernia contains adipose tissue.   Aortic Atherosclerosis (ICD10-I70.0).     REVIEW OF SYSTEMS:   Constitutional: Denies fevers, chills or abnormal weight loss Eyes: Denies blurriness of vision Ears, nose, mouth, throat, and face: Denies mucositis or sore throat Respiratory: Denies cough, dyspnea or wheezes Cardiovascular: Denies palpitation, chest discomfort or lower extremity swelling Gastrointestinal:  Denies nausea, heartburn or change in bowel habits Skin: Denies  abnormal skin rashes Lymphatics: Denies new lymphadenopathy or easy bruising Neurological:Denies numbness, tingling or new weaknesses Behavioral/Psych: Mood is stable, no new changes  All other systems were reviewed with the patient and are negative.  I have reviewed the past medical history, past surgical history, social history and family history with the patient and they are unchanged from previous note.  ALLERGIES:  is allergic to latex.  MEDICATIONS:  Current Outpatient Medications  Medication Sig Dispense Refill  . acetaminophen (TYLENOL) 325 MG tablet Take 650 mg by mouth every 6 (six) hours as needed for mild pain or moderate pain.     Marland Kitchen amoxicillin (AMOXIL) 500 MG capsule Take 2,000 mg by mouth See admin instructions. Prior to dental appointment, takes 4 tables prior to appt    . Biotin w/ Vitamins C & E (HAIR/SKIN/NAILS PO) Take 1 tablet by mouth daily.    . Calcium Carb-Cholecalciferol (CALCIUM 600 + D PO) Take 2 tablets by mouth daily.    . fluticasone (FLONASE) 50 MCG/ACT nasal spray Place 1 spray into both nostrils 2 (two) times daily as needed for allergies.   1  . gabapentin (NEURONTIN) 600 MG tablet TAKE 1 TABLET BY MOUTH TWICE A DAY FOR RESTLESS LEGS AND NEUROPATHY.    Marland Kitchen HYDROcodone-acetaminophen (NORCO) 10-325 MG tablet Take 1 tablet by mouth every 6 (six) hours as needed. 30 tablet 0  . ibuprofen (ADVIL,MOTRIN) 200 MG tablet Take 400 mg by mouth every 8 (eight) hours as needed for mild pain.    Marland Kitchen levothyroxine (SYNTHROID) 175 MCG tablet Take 1 tablet (175 mcg total) by mouth daily before breakfast. (Patient taking differently: Take 175 mcg by mouth every other day. Take before breakfast every other day. Alternate  with Synthroid 200 mcg.) 30 tablet 3  . levothyroxine (SYNTHROID) 200 MCG tablet Take 200 mcg by mouth every other day. Take every other day before breakfast/ alternate with Synthroid 175 mcg    . lidocaine-prilocaine (EMLA) cream APPLY TO AFFECTED AREA ONCE 30 g 3   . loratadine (CLARITIN) 10 MG tablet Take 10 mg by mouth daily.    Marland Kitchen LORazepam (ATIVAN) 1 MG tablet TAKE 1 TABLET (1 MG TOTAL) BY MOUTH EVERY 8 (EIGHT) HOURS AS NEEDED FOR ANXIETY OR SLEEP. 90 tablet 0  . losartan-hydrochlorothiazide (HYZAAR) 100-12.5 MG tablet Take 0.5 tablets by mouth daily. 60 tablet 9  . magnesium oxide (MAG-OX) 400 MG tablet Take 1 tablet by mouth 2 (two) times daily.    . Melatonin 10 MG CAPS Take 10 mg by mouth at bedtime.    . niacinamide 500 MG tablet Take 500 mg by mouth 3 (three) times daily with meals.    . ondansetron (ZOFRAN) 8 MG tablet Take 1 tablet (8 mg total) by mouth every 8 (eight) hours as needed for nausea. 60 tablet 1  . potassium gluconate 595 (99 K) MG TABS tablet Take by mouth.    . pramipexole (MIRAPEX) 0.5 MG tablet Take 1 mg by mouth at bedtime.    . traZODone (DESYREL) 50 MG tablet Take 1.5 tablets (75 mg total) by mouth at bedtime. (Patient taking differently: Take 50 mg by mouth at bedtime. ) 135 tablet 1   No current facility-administered medications for this visit.    PHYSICAL EXAMINATION: ECOG PERFORMANCE STATUS: 1 - Symptomatic but completely ambulatory  Vitals:   01/04/20 1044  BP: 138/85  Pulse: 76  Resp: 18  SpO2: 98%   Filed Weights   01/04/20 1044  Weight: 215 lb 12.8 oz (97.9 kg)    GENERAL:alert, no distress and comfortable SKIN: skin color, texture, turgor are normal, no rashes or significant lesions EYES: normal, Conjunctiva are pink and non-injected, sclera clear OROPHARYNX:no exudate, no erythema and lips, buccal mucosa, and tongue normal  NECK: supple, thyroid normal size, non-tender, without nodularity LYMPH:  no palpable lymphadenopathy in the cervical, axillary or inguinal LUNGS: clear to auscultation and percussion with normal breathing effort HEART: regular rate & rhythm and no murmurs and no lower extremity edema ABDOMEN:abdomen soft, non-tender and normal bowel sounds Musculoskeletal:no cyanosis of digits  and no clubbing  NEURO: alert & oriented x 3 with fluent speech, no focal motor/sensory deficits  LABORATORY DATA:  I have reviewed the data as listed    Component Value Date/Time   NA 139 12/14/2019 1230   NA 141 07/09/2017 1312   K 3.5 12/14/2019 1230   K 3.9 07/09/2017 1312   CL 103 12/14/2019 1230   CO2 26 12/14/2019 1230   CO2 27 07/09/2017 1312   GLUCOSE 86 12/14/2019 1230   GLUCOSE 109 07/09/2017 1312   BUN 19 12/14/2019 1230   BUN 16.4 07/09/2017 1312   CREATININE 0.87 12/14/2019 1230   CREATININE 0.91 01/04/2019 1351   CREATININE 0.8 07/09/2017 1312   CALCIUM 9.5 12/14/2019 1230   CALCIUM 9.2 07/09/2017 1312   PROT 7.1 12/14/2019 1230   PROT 7.1 07/09/2017 1312   ALBUMIN 3.6 12/14/2019 1230   ALBUMIN 3.1 (L) 07/09/2017 1312   AST 16 12/14/2019 1230   AST 19 01/04/2019 1351   AST 35 (H) 07/09/2017 1312   ALT 13 12/14/2019 1230   ALT 20 01/04/2019 1351   ALT 43 07/09/2017 1312   ALKPHOS 128 (H) 12/14/2019 1230  ALKPHOS 182 (H) 07/09/2017 1312   BILITOT 0.6 12/14/2019 1230   BILITOT 0.3 01/04/2019 1351   BILITOT 0.34 07/09/2017 1312   GFRNONAA >60 12/14/2019 1230   GFRNONAA >60 01/04/2019 1351   GFRAA >60 12/14/2019 1230   GFRAA >60 01/04/2019 1351    No results found for: SPEP, UPEP  Lab Results  Component Value Date   WBC 7.5 01/04/2020   NEUTROABS 4.6 01/04/2020   HGB 12.0 01/04/2020   HCT 37.1 01/04/2020   MCV 82.4 01/04/2020   PLT 186 01/04/2020      Chemistry      Component Value Date/Time   NA 139 12/14/2019 1230   NA 141 07/09/2017 1312   K 3.5 12/14/2019 1230   K 3.9 07/09/2017 1312   CL 103 12/14/2019 1230   CO2 26 12/14/2019 1230   CO2 27 07/09/2017 1312   BUN 19 12/14/2019 1230   BUN 16.4 07/09/2017 1312   CREATININE 0.87 12/14/2019 1230   CREATININE 0.91 01/04/2019 1351   CREATININE 0.8 07/09/2017 1312      Component Value Date/Time   CALCIUM 9.5 12/14/2019 1230   CALCIUM 9.2 07/09/2017 1312   ALKPHOS 128 (H) 12/14/2019  1230   ALKPHOS 182 (H) 07/09/2017 1312   AST 16 12/14/2019 1230   AST 19 01/04/2019 1351   AST 35 (H) 07/09/2017 1312   ALT 13 12/14/2019 1230   ALT 20 01/04/2019 1351   ALT 43 07/09/2017 1312   BILITOT 0.6 12/14/2019 1230   BILITOT 0.3 01/04/2019 1351   BILITOT 0.34 07/09/2017 1312

## 2020-01-04 NOTE — Assessment & Plan Note (Signed)
She has appointment to see podiatrist I gave her a small prescription of pain medicine to take as needed I warned her about risk of nausea, sedation and constipation

## 2020-01-06 ENCOUNTER — Telehealth: Payer: Self-pay

## 2020-01-06 NOTE — Telephone Encounter (Signed)
She called and left a message to call her.   Called back. She has another appt on 7/20 and needs to move appts to 7/21. Scheduling message sent.

## 2020-01-07 ENCOUNTER — Telehealth: Payer: Self-pay | Admitting: Hematology and Oncology

## 2020-01-07 NOTE — Telephone Encounter (Signed)
Scheduled per 6/29 sch msg. Called and spoke with pt, confirmed appts

## 2020-01-17 ENCOUNTER — Ambulatory Visit (INDEPENDENT_AMBULATORY_CARE_PROVIDER_SITE_OTHER): Payer: Medicare Other | Admitting: Orthopedic Surgery

## 2020-01-17 ENCOUNTER — Ambulatory Visit (INDEPENDENT_AMBULATORY_CARE_PROVIDER_SITE_OTHER): Payer: Medicare Other

## 2020-01-17 ENCOUNTER — Encounter: Payer: Self-pay | Admitting: Orthopedic Surgery

## 2020-01-17 ENCOUNTER — Other Ambulatory Visit: Payer: Self-pay

## 2020-01-17 VITALS — Ht 65.5 in | Wt 215.8 lb

## 2020-01-17 DIAGNOSIS — M76821 Posterior tibial tendinitis, right leg: Secondary | ICD-10-CM

## 2020-01-17 DIAGNOSIS — M79671 Pain in right foot: Secondary | ICD-10-CM

## 2020-01-18 ENCOUNTER — Encounter: Payer: Self-pay | Admitting: Orthopedic Surgery

## 2020-01-18 NOTE — Progress Notes (Addendum)
Office Visit Note   Patient: Michelle Rosario           Date of Birth: August 10, 1945           MRN: [id number] Visit Date: 01/17/2020              Requested by: [name], MD [place],  [place] [id number] PCP: [name], MD  Chief Complaint  Patient presents with  . Right Ankle - Pain, New Patient (Initial Visit)      HPI: Patient is a 74 year old woman who is seen for initial evaluation for pain posterior medial malleolus right ankle pain.  She states that this started in April she states she has seen podiatry, she has tried a fracture boot has tried a neoprene ankle support has tried custom orthotics without relief.  She states the pain is getting worse Assessment & Plan: Visit Diagnoses:  1. Right foot pain   2. Posterior tibial tendon dysfunction (PTTD) of right lower extremity     Plan: Patient will be placed in a posterior tibial tendon brace she will keep this inflated to per side supports of the posterior tibial tendon.  Discussed that if she fails conservative treatment a surgical option is talonavicular and subtalar fusion.  Discussed that she would be off her foot for about 2 months.  Follow-Up Instructions: Return in about 4 weeks (around 02/14/2020).   Ortho Exam  Patient is alert, oriented, no adenopathy, well-dressed, normal affect, normal respiratory effort. Examination patient has a good dorsalis pedis pulse.  She is maximally tender to palpation along the posterior tibial tendon posterior to the medial malleolus.  Patient has pain with weightbearing she cannot do a single limb heel raise.  Patient states she is worn orthotics in the fracture boot for 4 weeks.  Imaging: XR Ankle 2 Views Right  Result Date: 01/18/2020 2 view radiographs of the right ankle shows a congruent mortise no bony abnormalities no fractures.  Radiograph shows 2 cannulated screws from a previous medial malleolar fracture without displacement  No images are attached  to the encounter.  Labs: Lab Results  Component Value Date   REPTSTATUS 09/01/2016 FINAL 08/30/2016   CULT  08/30/2016    NO MRSA DETECTED Performed at [facility], [place] [address], [place], [place] [address]      Lab Results  Component Value Date   ALBUMIN 3.5 01/04/2020   ALBUMIN 3.6 12/14/2019   ALBUMIN 3.4 (L) 11/23/2019    Lab Results  Component Value Date   MG 1.8 07/29/2016   MG 2.0 07/22/2016   MG 1.9 07/18/2016   No results found for: VD25OH  No results found for: PREALBUMIN CBC EXTENDED Latest Ref Rng & Units 01/04/2020 12/14/2019 11/23/2019  WBC 4.0 - 10.5 K/uL 7.5 7.4 6.2  RBC 3.87 - 5.11 MIL/uL 4.50 4.51 4.27  HGB 12.0 - 15.0 g/dL 12.0 12.4 11.9(L)  HCT 36 - 46 % 37.1 37.6 36.2  PLT 150 - 400 K/uL 186 200 204  NEUTROABS 1.7 - 7.7 K/uL 4.6 4.6 3.8  LYMPHSABS 0.7 - 4.0 K/uL 1.5 1.6 1.4     Body mass index is 35.36 kg/m.  Orders:  Orders Placed This Encounter  Procedures  . XR Ankle 2 Views Right   No orders of the defined types were placed in this encounter.    Procedures: No procedures performed  Clinical Data: No additional findings.  ROS:  All other systems negative, except as noted  in the HPI. Review of Systems  Objective: Vital Signs: Ht 5' 5.5" (1.664 m)   Wt 215 lb 12.8 oz (97.9 kg)   BMI 35.36 kg/m   Specialty Comments:  No specialty comments available.  PMFS History: Patient Active Problem List   Diagnosis Date Noted  . Right foot pain 01/04/2020  . Obesity, Class II, BMI 35-39.9 09/10/2019  . Abdominal pain 08/31/2019  . Dermatitis 12/15/2018  . Mucositis 12/15/2018  . Chronic constipation 01/26/2018  . Iron deficiency anemia 11/13/2017  . Encounter for antineoplastic chemotherapy 09/01/2017  . Chronic nausea 08/29/2017  . Metastasis to liver ([place]) 08/29/2017  . Hot flashes 06/12/2017  . 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 Goals of care, counseling/discussion 05/28/2017  . 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 Peripheral neuropathy due to chemotherapy ([place]) 09/30/2016  . Anemia due to antineoplastic chemotherapy 09/30/2016  . Restless leg syndrome 09/19/2016  . Deficiency anemia 09/19/2016  . Superficial thrombophlebitis of cephalic vein 71/24/5809  . Pancytopenia, acquired ([place]) 08/15/2016  . Thoracic aorta atherosclerosis ([place]) 05/29/2016  . Acquired hypothyroidism 05/29/2016  . Essential hypertension 05/29/2016  . Recurrent carcinoma of endometrium ([place]) 01/02/2016   Past Medical History:  Diagnosis Date  . Broken ribs    hx of  . Family history of adverse reaction to anesthesia    mother post op N&V  . History of radiation therapy 06/24/16-08/01/16 and 08/13/16, 08/20/16, 08/27/16, 09/04/16   pelvis treated to 55 Gy in 25 fractions, vaginal cuff treated to 24 Gy in 4 fractions  . Hypertension   . Hypothyroidism   . Liver metastases ([place]) dx'd 2018  . Uterine cancer [facility])    May 30 , 2017    Family History  Problem Relation Age of Onset  . Anesthesia problems Mother   . Uterine cancer Mother 75  . Bladder Cancer Father 35  . Diabetes Sister   . Rectal cancer Cousin 69       d.69    Past Surgical History:  Procedure Laterality Date  . achellis tendon repaired in 2005    . ANKLE SURGERY Right Oct 2006  . fisure  2014  . IR FLUORO GUIDE PORT INSERTION RIGHT  09/10/2017  . IR US GUIDE VASC ACCESS RIGHT  09/10/2017  . LYMPH NODE BIOPSY N/A 01/02/2016   Procedure: SENTINAL LYMPH NODE BIOPSY;  Surgeon: Everitt Amber, MD;  Location: [facility];  Service: Gynecology;  Laterality: N/A;  . rectal fissure surgery 2010    . REPLACEMENT TOTAL KNEE Left 2015  . ROBOTIC ASSISTED TOTAL HYSTERECTOMY WITH BILATERAL SALPINGO OOPHERECTOMY Bilateral 01/02/2016   Procedure: XI ROBOTIC ASSISTED TOTAL LAPAROSCOPIC HYSTERECTOMY WITH BILATERAL SALPINGO OOPHORECTOMY;  Surgeon: Everitt Amber, MD;  Location: [facility];  Service: Gynecology;  Laterality: Bilateral;  . TUBAL LIGATION      Social History   Occupational History  . Occupation: retired Sales executive  Tobacco Use  . Smoking status: Never Smoker  . Smokeless tobacco: Never Used  Substance and Sexual Activity  . Alcohol use: Yes    Comment: socially  . Drug use: No  . Sexual activity: Never

## 2020-01-25 ENCOUNTER — Other Ambulatory Visit: Payer: Medicare Other

## 2020-01-25 ENCOUNTER — Ambulatory Visit: Payer: Medicare Other

## 2020-01-26 ENCOUNTER — Inpatient Hospital Stay: Payer: Medicare Other | Attending: Hematology and Oncology

## 2020-01-26 ENCOUNTER — Other Ambulatory Visit: Payer: Self-pay

## 2020-01-26 ENCOUNTER — Inpatient Hospital Stay: Payer: Medicare Other

## 2020-01-26 VITALS — BP 158/83 | HR 66 | Temp 98.2°F | Resp 18

## 2020-01-26 DIAGNOSIS — E039 Hypothyroidism, unspecified: Secondary | ICD-10-CM | POA: Diagnosis not present

## 2020-01-26 DIAGNOSIS — C787 Secondary malignant neoplasm of liver and intrahepatic bile duct: Secondary | ICD-10-CM

## 2020-01-26 DIAGNOSIS — C541 Malignant neoplasm of endometrium: Secondary | ICD-10-CM | POA: Insufficient documentation

## 2020-01-26 DIAGNOSIS — Z5112 Encounter for antineoplastic immunotherapy: Secondary | ICD-10-CM | POA: Insufficient documentation

## 2020-01-26 DIAGNOSIS — Z79899 Other long term (current) drug therapy: Secondary | ICD-10-CM | POA: Insufficient documentation

## 2020-01-26 LAB — COMPREHENSIVE METABOLIC PANEL
ALT: 11 U/L (ref 0–44)
AST: 14 U/L — ABNORMAL LOW (ref 15–41)
Albumin: 3.5 g/dL (ref 3.5–5.0)
Alkaline Phosphatase: 114 U/L (ref 38–126)
Anion gap: 8 (ref 5–15)
BUN: 21 mg/dL (ref 8–23)
CO2: 27 mmol/L (ref 22–32)
Calcium: 9.8 mg/dL (ref 8.9–10.3)
Chloride: 104 mmol/L (ref 98–111)
Creatinine, Ser: 0.87 mg/dL (ref 0.44–1.00)
GFR calc Af Amer: >60 mL/min (ref >60)
GFR calc non Af Amer: >60 mL/min (ref >60)
Glucose, Bld: 89 mg/dL (ref 70–99)
Potassium: 4 mmol/L (ref 3.5–5.1)
Sodium: 139 mmol/L (ref 135–145)
Total Bilirubin: 0.6 mg/dL (ref 0.3–1.2)
Total Protein: 6.8 g/dL (ref 6.5–8.1)

## 2020-01-26 LAB — CBC WITH DIFFERENTIAL/PLATELET
Abs Immature Granulocytes: 0.03 10*3/uL (ref 0.00–0.07)
Basophils Absolute: 0.1 10*3/uL (ref 0.0–0.1)
Basophils Relative: 1 %
Eosinophils Absolute: 0.8 10*3/uL — ABNORMAL HIGH (ref 0.0–0.5)
Eosinophils Relative: 10 %
HCT: 36.4 % (ref 36.0–46.0)
Hemoglobin: 12.1 g/dL (ref 12.0–15.0)
Immature Granulocytes: 0 %
Lymphocytes Relative: 22 %
Lymphs Abs: 1.7 10*3/uL (ref 0.7–4.0)
MCH: 27.7 pg (ref 26.0–34.0)
MCHC: 33.2 g/dL (ref 30.0–36.0)
MCV: 83.3 fL (ref 80.0–100.0)
Monocytes Absolute: 0.5 10*3/uL (ref 0.1–1.0)
Monocytes Relative: 7 %
Neutro Abs: 4.5 10*3/uL (ref 1.7–7.7)
Neutrophils Relative %: 60 %
Platelets: 193 10*3/uL (ref 150–400)
RBC: 4.37 MIL/uL (ref 3.87–5.11)
RDW: 13.3 % (ref 11.5–15.5)
WBC: 7.5 10*3/uL (ref 4.0–10.5)
nRBC: 0 % (ref 0.0–0.2)

## 2020-01-26 LAB — TSH: TSH: 4.839 u[IU]/mL — ABNORMAL HIGH (ref 0.308–3.960)

## 2020-01-26 MED ORDER — SODIUM CHLORIDE 0.9% FLUSH
10.0000 mL | INTRAVENOUS | Status: DC | PRN
  Administered 2020-01-26: 10 mL
  Filled 2020-01-26: qty 10

## 2020-01-26 MED ORDER — SODIUM CHLORIDE 0.9% FLUSH
10.0000 mL | Freq: Once | INTRAVENOUS | Status: AC
  Administered 2020-01-26: 10 mL
  Filled 2020-01-26: qty 10

## 2020-01-26 MED ORDER — SODIUM CHLORIDE 0.9 % IV SOLN
200.0000 mg | Freq: Once | INTRAVENOUS | Status: AC
  Administered 2020-01-26: 200 mg via INTRAVENOUS
  Filled 2020-01-26: qty 8

## 2020-01-26 MED ORDER — HEPARIN SOD (PORK) LOCK FLUSH 100 UNIT/ML IV SOLN
500.0000 [IU] | Freq: Once | INTRAVENOUS | Status: AC | PRN
  Administered 2020-01-26: 500 [IU]
  Filled 2020-01-26: qty 5

## 2020-01-26 MED ORDER — SODIUM CHLORIDE 0.9 % IV SOLN
Freq: Once | INTRAVENOUS | Status: AC
  Filled 2020-01-26: qty 250

## 2020-02-14 ENCOUNTER — Ambulatory Visit (INDEPENDENT_AMBULATORY_CARE_PROVIDER_SITE_OTHER): Payer: Medicare Other

## 2020-02-14 ENCOUNTER — Ambulatory Visit (INDEPENDENT_AMBULATORY_CARE_PROVIDER_SITE_OTHER): Payer: Medicare Other | Admitting: Physician Assistant

## 2020-02-14 ENCOUNTER — Encounter: Payer: Self-pay | Admitting: Physician Assistant

## 2020-02-14 VITALS — Ht 71.0 in | Wt 215.8 lb

## 2020-02-14 DIAGNOSIS — M79671 Pain in right foot: Secondary | ICD-10-CM

## 2020-02-14 NOTE — Progress Notes (Signed)
Office Visit Note   Patient: Michelle Rosario           Date of Birth: 04/23/1946           MRN: [id number] Visit Date: 02/14/2020              Requested by: [name], [address] [place],  [place] [address] PCP: [name], MD  Chief Complaint  Patient presents with  . Right Ankle - Follow-up      HPI: Patient presents in follow-up today.  She has a history of right ankle posterior tibial tendinitis.  She is also status post ORIF of medial malleolus fracture in the distant past.  She is doing very well and finds her posterior tibial tendon brace to be working quite well.  She was concerned because she did have a fall the other day.  While she is not particularly painful she did have some swelling  Assessment & Plan: Visit Diagnoses:  1. Right foot pain     Plan: Continue with brace for another month continue working on ankle range of motion will follow up at that time  Follow-Up Instructions: No follow-ups on file.   Ortho Exam  Patient is alert, oriented, no adenopathy, well-dressed, normal affect, normal respiratory effort. Focused examination of her ankle she has well-healed surgical incisions.  CMS is intact.  She has fairly good strength with resisted inversion.  No tenderness over the medial lateral malleolus no tenderness in the midfoot with manipulation no ecchymosis minimal soft tissue swelling no cellulitis  Imaging: No results found. No images are attached to the encounter.  Labs: Lab Results  Component Value Date   REPTSTATUS 09/01/2016 FINAL 08/30/2016   CULT  08/30/2016    NO MRSA DETECTED Performed at [facility], [place] [address], [place], [place] [address]      Lab Results  Component Value Date   ALBUMIN 3.5 01/26/2020   ALBUMIN 3.5 01/04/2020   ALBUMIN 3.6 12/14/2019    Lab Results  Component Value Date   MG 1.8 07/29/2016   MG 2.0 07/22/2016   MG 1.9 07/18/2016   No results found for: VD25OH  No results found  for: PREALBUMIN CBC EXTENDED Latest Ref Rng & Units 01/26/2020 01/04/2020 12/14/2019  WBC 4.0 - 10.5 K/uL 7.5 7.5 7.4  RBC 3.87 - 5.11 MIL/uL 4.37 4.50 4.51  HGB 12.0 - 15.0 g/dL 12.1 12.0 12.4  HCT 36 - 46 % 36.4 37.1 37.6  PLT 150 - 400 K/uL 193 186 200  NEUTROABS 1.7 - 7.7 K/uL 4.5 4.6 4.6  LYMPHSABS 0.7 - 4.0 K/uL 1.7 1.5 1.6     Body mass index is 30.1 kg/m.  Orders:  Orders Placed This Encounter  Procedures  . XR Ankle 2 Views Right   No orders of the defined types were placed in this encounter.    Procedures: No procedures performed  Clinical Data: No additional findings.  ROS:  All other systems negative, except as noted in the HPI. Review of Systems  Objective: Vital Signs: Ht 5\' 11"  (1.803 m)   Wt 215 lb 12.8 oz (97.9 kg)   BMI 30.10 kg/m   Specialty Comments:  No specialty comments available.  PMFS History: Patient Active Problem List   Diagnosis Date Noted  . Right foot pain 01/04/2020  . Obesity, Class II, BMI 35-39.9 09/10/2019  . Abdominal pain 08/31/2019  . Dermatitis 12/15/2018  . Mucositis 12/15/2018  . Chronic constipation  01/26/2018  . Iron deficiency anemia 11/13/2017  . Encounter for antineoplastic chemotherapy 09/01/2017  . Chronic nausea 08/29/2017  . Metastasis to liver ([place]) 08/29/2017  . Hot flashes 06/12/2017  . 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 Goals of care, counseling/discussion 05/28/2017  . 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 Peripheral neuropathy due to chemotherapy ([place]) 09/30/2016  . Anemia due to antineoplastic chemotherapy 09/30/2016  . Restless leg syndrome 09/19/2016  . Deficiency anemia 09/19/2016  . Superficial thrombophlebitis of cephalic vein 58/52/7782  . Pancytopenia, acquired ([place]) 08/15/2016  . Thoracic aorta atherosclerosis ([place]) 05/29/2016  . Acquired hypothyroidism 05/29/2016  . Essential hypertension 05/29/2016  . Recurrent carcinoma of endometrium ([place]) 01/02/2016    Past Medical History:  Diagnosis Date  . Broken ribs    hx of  . Family history of adverse reaction to anesthesia    mother post op N&V  . History of radiation therapy 06/24/16-08/01/16 and 08/13/16, 08/20/16, 08/27/16, 09/04/16   pelvis treated to 55 Gy in 25 fractions, vaginal cuff treated to 24 Gy in 4 fractions  . Hypertension   . Hypothyroidism   . Liver metastases ([place]) dx'd 2018  . Uterine cancer [facility])    May 30 , 2017    Family History  Problem Relation Age of Onset  . Anesthesia problems Mother   . Uterine cancer Mother 55  . Bladder Cancer Father 63  . Diabetes Sister   . Rectal cancer Cousin 69       d.69    Past Surgical History:  Procedure Laterality Date  . achellis tendon repaired in 2005    . ANKLE SURGERY Right Oct 2006  . fisure  2014  . IR FLUORO GUIDE PORT INSERTION RIGHT  09/10/2017  . IR US GUIDE VASC ACCESS RIGHT  09/10/2017  . LYMPH NODE BIOPSY N/A 01/02/2016   Procedure: SENTINAL LYMPH NODE BIOPSY;  Surgeon: Everitt Amber, MD;  Location: [facility];  Service: Gynecology;  Laterality: N/A;  . rectal fissure surgery 2010    . REPLACEMENT TOTAL KNEE Left 2015  . ROBOTIC ASSISTED TOTAL HYSTERECTOMY WITH BILATERAL SALPINGO OOPHERECTOMY Bilateral 01/02/2016   Procedure: XI ROBOTIC ASSISTED TOTAL LAPAROSCOPIC HYSTERECTOMY WITH BILATERAL SALPINGO OOPHORECTOMY;  Surgeon: Everitt Amber, MD;  Location: [facility];  Service: Gynecology;  Laterality: Bilateral;  . TUBAL LIGATION     Social History   Occupational History  . Occupation: retired Sales executive  Tobacco Use  . Smoking status: Never Smoker  . Smokeless tobacco: Never Used  Substance and Sexual Activity  . Alcohol use: Yes    Comment: socially  . Drug use: No  . Sexual activity: Never

## 2020-02-15 ENCOUNTER — Ambulatory Visit: Payer: Medicare Other | Admitting: Hematology and Oncology

## 2020-02-15 ENCOUNTER — Other Ambulatory Visit: Payer: Medicare Other

## 2020-02-15 ENCOUNTER — Ambulatory Visit: Payer: Medicare Other

## 2020-02-16 ENCOUNTER — Inpatient Hospital Stay (HOSPITAL_BASED_OUTPATIENT_CLINIC_OR_DEPARTMENT_OTHER): Payer: Medicare Other | Admitting: Hematology and Oncology

## 2020-02-16 ENCOUNTER — Other Ambulatory Visit: Payer: Self-pay

## 2020-02-16 ENCOUNTER — Encounter: Payer: Self-pay | Admitting: Hematology and Oncology

## 2020-02-16 ENCOUNTER — Inpatient Hospital Stay: Payer: Medicare Other

## 2020-02-16 ENCOUNTER — Inpatient Hospital Stay: Payer: Medicare Other | Attending: Hematology and Oncology

## 2020-02-16 VITALS — BP 143/74 | HR 74 | Temp 97.3°F | Resp 17 | Ht 71.0 in | Wt 217.2 lb

## 2020-02-16 DIAGNOSIS — Z5112 Encounter for antineoplastic immunotherapy: Secondary | ICD-10-CM | POA: Diagnosis present

## 2020-02-16 DIAGNOSIS — E039 Hypothyroidism, unspecified: Secondary | ICD-10-CM

## 2020-02-16 DIAGNOSIS — C787 Secondary malignant neoplasm of liver and intrahepatic bile duct: Secondary | ICD-10-CM

## 2020-02-16 DIAGNOSIS — D509 Iron deficiency anemia, unspecified: Secondary | ICD-10-CM

## 2020-02-16 DIAGNOSIS — Z79899 Other long term (current) drug therapy: Secondary | ICD-10-CM | POA: Insufficient documentation

## 2020-02-16 DIAGNOSIS — C541 Malignant neoplasm of endometrium: Secondary | ICD-10-CM

## 2020-02-16 DIAGNOSIS — T451X5A Adverse effect of antineoplastic and immunosuppressive drugs, initial encounter: Secondary | ICD-10-CM

## 2020-02-16 DIAGNOSIS — D6481 Anemia due to antineoplastic chemotherapy: Secondary | ICD-10-CM | POA: Insufficient documentation

## 2020-02-16 DIAGNOSIS — Z791 Long term (current) use of non-steroidal anti-inflammatories (NSAID): Secondary | ICD-10-CM | POA: Diagnosis not present

## 2020-02-16 LAB — COMPREHENSIVE METABOLIC PANEL
ALT: 13 U/L (ref 0–44)
AST: 16 U/L (ref 15–41)
Albumin: 3.1 g/dL — ABNORMAL LOW (ref 3.5–5.0)
Alkaline Phosphatase: 127 U/L — ABNORMAL HIGH (ref 38–126)
Anion gap: 8 (ref 5–15)
BUN: 16 mg/dL (ref 8–23)
CO2: 27 mmol/L (ref 22–32)
Calcium: 9.5 mg/dL (ref 8.9–10.3)
Chloride: 106 mmol/L (ref 98–111)
Creatinine, Ser: 0.74 mg/dL (ref 0.44–1.00)
GFR calc Af Amer: >60 mL/min (ref >60)
GFR calc non Af Amer: >60 mL/min (ref >60)
Glucose, Bld: 94 mg/dL (ref 70–99)
Potassium: 3.8 mmol/L (ref 3.5–5.1)
Sodium: 141 mmol/L (ref 135–145)
Total Bilirubin: 0.4 mg/dL (ref 0.3–1.2)
Total Protein: 6.1 g/dL — ABNORMAL LOW (ref 6.5–8.1)

## 2020-02-16 LAB — CBC WITH DIFFERENTIAL/PLATELET
Abs Immature Granulocytes: 0.04 10*3/uL (ref 0.00–0.07)
Basophils Absolute: 0.1 10*3/uL (ref 0.0–0.1)
Basophils Relative: 1 %
Eosinophils Absolute: 1.9 10*3/uL — ABNORMAL HIGH (ref 0.0–0.5)
Eosinophils Relative: 24 %
HCT: 35.7 % — ABNORMAL LOW (ref 36.0–46.0)
Hemoglobin: 11.7 g/dL — ABNORMAL LOW (ref 12.0–15.0)
Immature Granulocytes: 1 %
Lymphocytes Relative: 19 %
Lymphs Abs: 1.5 10*3/uL (ref 0.7–4.0)
MCH: 27 pg (ref 26.0–34.0)
MCHC: 32.8 g/dL (ref 30.0–36.0)
MCV: 82.3 fL (ref 80.0–100.0)
Monocytes Absolute: 0.5 10*3/uL (ref 0.1–1.0)
Monocytes Relative: 6 %
Neutro Abs: 3.9 10*3/uL (ref 1.7–7.7)
Neutrophils Relative %: 49 %
Platelets: 186 10*3/uL (ref 150–400)
RBC: 4.34 MIL/uL (ref 3.87–5.11)
RDW: 13.6 % (ref 11.5–15.5)
WBC: 7.8 10*3/uL (ref 4.0–10.5)
nRBC: 0 % (ref 0.0–0.2)

## 2020-02-16 LAB — TSH: TSH: 5.912 u[IU]/mL — ABNORMAL HIGH (ref 0.308–3.960)

## 2020-02-16 MED ORDER — SODIUM CHLORIDE 0.9 % IV SOLN
Freq: Once | INTRAVENOUS | Status: AC
  Filled 2020-02-16: qty 250

## 2020-02-16 MED ORDER — SODIUM CHLORIDE 0.9% FLUSH
10.0000 mL | INTRAVENOUS | Status: DC | PRN
  Administered 2020-02-16: 10 mL
  Filled 2020-02-16: qty 10

## 2020-02-16 MED ORDER — SODIUM CHLORIDE 0.9 % IV SOLN
200.0000 mg | Freq: Once | INTRAVENOUS | Status: AC
  Administered 2020-02-16: 200 mg via INTRAVENOUS
  Filled 2020-02-16: qty 8

## 2020-02-16 MED ORDER — SODIUM CHLORIDE 0.9% FLUSH
10.0000 mL | Freq: Once | INTRAVENOUS | Status: AC
  Administered 2020-02-16: 10 mL
  Filled 2020-02-16: qty 10

## 2020-02-16 MED ORDER — HEPARIN SOD (PORK) LOCK FLUSH 100 UNIT/ML IV SOLN
500.0000 [IU] | Freq: Once | INTRAVENOUS | Status: AC | PRN
  Administered 2020-02-16: 500 [IU]
  Filled 2020-02-16: qty 5

## 2020-02-16 NOTE — Patient Instructions (Signed)

## 2020-02-16 NOTE — Assessment & Plan Note (Signed)
Overall, she tolerated treatment well We will continue pembrolizumab indefinitely I plan to repeat imaging study before her next dose

## 2020-02-16 NOTE — Assessment & Plan Note (Signed)
Her recent TSH is intermittently elevated She will continue chronic thyroid replacement therapy We will adjust her thyroid medicine as needed 

## 2020-02-16 NOTE — Progress Notes (Signed)
[place] [facility] OFFICE PROGRESS NOTE  Patient Care Team: [name], MD as PCP - General (Family Medicine)  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Michelle Rosario) Overall, she tolerated treatment well We will continue pembrolizumab indefinitely I plan to repeat imaging study before her next dose  Acquired hypothyroidism Her recent TSH is intermittently elevated She will continue chronic thyroid replacement therapy We will adjust her thyroid medicine as needed  Anemia due to antineoplastic chemotherapy She has multifactorial anemia, component of anemia chronic disease and iron deficiency Observe for now I plan to recheck her iron study in her next visit  Metastasis to liver [facility]) The liver function tests are stable We will observe closely   Orders Placed This Encounter  Procedures  . CT ABDOMEN PELVIS W CONTRAST    Standing Status:   Future    Standing Expiration Date:   02/15/2021    Order Specific Question:   If indicated for the ordered procedure, I authorize the administration of contrast media per Radiology protocol    Answer:   Yes    Order Specific Question:   Preferred imaging location?    Answer:   [facility]    Order Specific Question:   Radiology Contrast Protocol - do NOT remove file path    Answer:   [email or web address]  . Ferritin    Standing Status:   Future    Standing Expiration Date:   02/15/2021  . Iron and TIBC    Standing Status:   Future    Standing Expiration Date:   02/15/2021    All questions were answered. The patient knows to call the clinic with any problems, questions or concerns. The total time spent in the appointment was 20 minutes encounter with patients including review of chart and various tests results, discussions about plan of care and coordination of care plan   Michelle Lark, MD 02/16/2020 10:22 AM  INTERVAL HISTORY: Please see below for problem oriented charting.. She returns  for treatment and follow-up She just returned from the beach Her foot pain is getting better She take intermittent doses of Aleve as needed She denies side effects from treatment so far No recent infection, fever or chills No infusion reactions Denies abdominal pain, nausea or changes in bowel habits The patient denies any recent signs or symptoms of bleeding such as spontaneous epistaxis, hematuria or hematochezia.   SUMMARY OF ONCOLOGIC HISTORY: Oncology History Overview Note  MSI stable on June 2017 tissue but high from [facility] study from November 2017  05/15/16: ER is moderately positive (70%). PR is strongly positive (80%).   Recurrent carcinoma of endometrium ([name])  01/02/2016 Pathology Results   Uterus +/- tubes/ovaries, neoplastic, cervix ENDOMETRIAL ADENOCARCINOMA, FIGO GRADE 2 (4.7 CM) THE TUMOR INVADES LESS THAN ONE-HALF OF THE MYOMETRIUM (PT1A) ALL MARGINS OF RESECTION ARE NEGATIVE FOR CARCINOMA LEIOMYOMAS AND ADENOMYOSIS BILATERAL FALLOPIAN TUBES AND OVARIES: HISTOLOGICAL UNREMARKABLE 2. Lymph node, sentinel, biopsy, right obturator ONE BENIGN LYMPH NODE (0/1) 3. Lymph nodes, regional resection, left pelvic FOUR BENIGN LYMPH NODES (0/4)   01/02/2016 Surgery   Dr. Denman George performed robotic-assisted laparoscopic total hysterectomy with bilateral salpingoophorectomy, sentinel lymph node biopsy, lymphadenectomy     05/15/2016 Pathology Results   Vagina, biopsy, mid - ADENOCARCINOMA, SEE COMMENT. Microscopic Comment The morphology along with the patient's history are consistent with recurrent endometrioid adenocarcinoma. The carcinoma has a similar appearance to the primary ([id number]).   05/20/2016 Imaging   Ct scan abdomen showed solid 2.5 cm  peritoneal mass in the mid to anterior left pelvis, suspicious for peritoneal metastasis. 2. Small expansile low-attenuation filling defect in the left external iliac vein, cannot exclude a small deep venous thrombus.  Consider correlation with left lower extremity venous Doppler scan. 3. Small simple fluid density structure in the left pelvic sidewall abutting the left external iliac vessels, favor a small postoperative seroma. 4. No ascites. 5. No lymphadenopathy.  No metastatic disease in the chest. 6. Aortic atherosclerosis.   06/12/2016 PET scan   Intensely hypermetabolic 2.1 cm central pelvic peritoneal mass just to the left of midline, consistent with peritoneal metastatic recurrence. No ascites. 2. No additional hypermetabolic sites of metastatic disease. 3. Diffuse thyroid hypermetabolism without discrete thyroid nodule, favoring thyroiditis. Recommend correlation with serum thyroid function tests.   06/24/2016 - 07/22/2016 Chemotherapy   The patient had weekly cisplatin. She has missed several doses due to infection and pancytopenia   06/24/2016 - 09/04/2016 Radiation Therapy   She completed concurrent radiation therapy Radiation treatment dates:   IMRT : 06/24/16 - 08/01/16 HDR : 08/13/16, 08/20/16, 08/27/16, 09/04/16  Site/dose:   Pelvis treated to 55 Gy in 25 fractions (simultaneous integrated boost technique) Vaginal Cuff treated to 24 Gy in 4 fractions   08/15/2016 - 12/03/2016 Chemotherapy   She received 6 cycles of carboplatin/Taxol   09/27/2016 Imaging   Interval decrease in size of previously described solid peritoneal nodule within the left anterior pelvis. Near complete resolution of previously described low-attenuation structure along the left pelvic sidewall. No evidence for metastatic disease in the chest. Aortic atherosclerosis.   12/30/2016 Imaging   Ct abdomen 1. Solitary left pelvic peritoneal implant is mildly decreased in size in the interval. 2. No new or progressive metastatic disease in the abdomen or pelvis. No ascites. 3. Aortic atherosclerosis.   04/02/2017 Imaging   Left lower quadrant peritoneal implant referenced on previous exam measures 1.7 x 1.3 cm, image 69 of series 2.  Increased from 0.8 x 0.8 cm previously peer no new peritoneal implants identified.  Musculoskeletal: The degenerative disc disease noted within the lumbar spine.  IMPRESSION: 1. Solitary left pelvic peritoneal implant is increased in size in the interval. 2. No new sites of disease.  No ascites. 3. Aortic atherosclerosis   04/11/2017 PET scan   1. The left side of pelvis peritoneal implant has decreased in size and degree of FDG uptake compatible with response to therapy. No new areas of peritoneal disease identified. 2. Persistent diffuse increased uptake within the thyroid gland. Correlation with patient's thyroid function may be helpful.   05/26/2017 Imaging   1. Enlarging tumor implant along the left adnexa, currently 2.6 by 2.4 cm and previously 1.9 by 1.3 cm. No new tumor implant or other specific cause for the patient's pelvic symptoms is currently identified. 2.  Aortic Atherosclerosis (ICD10-I70.0). 3. Lumbar spondylosis and degenerative disc disease causing multilevel impingement.   06/04/2017 - 08/28/2017 Chemotherapy   The patient started letrozole and everolimus   08/26/2017 Imaging   Increased size of mass in the left adnexal region.  Several new small liver metastases in right hepatic lobe   09/08/2017 Imaging   LV EF: 60% -  65%   09/10/2017 Procedure   Technically successful right IJ power-injectable port catheter placement. Ready for routine use   12/04/2017 Imaging   1. Interval increase in size and number of multiple lesions within the liver compatible with hepatic metastatic disease. 2. Interval increase in size mass within the left hemipelvis.   12/15/2017 -  Chemotherapy   The patient had pembrolizumab (KEYTRUDA) 200 mg in sodium chloride 0.9 % 50 mL chemo infusion, 200 mg, Intravenous, Once, 1 of 6 cycles Administration: 200 mg (12/15/2017)  for chemotherapy treatment.    12/21/2017 Imaging   1. Moderate left hydronephrosis and hydroureter, similar  compared to most recent CT from May 2019; obstruction appears to be secondary to a left pelvic mass/metastatic focus which has increased in size since the prior CT. 2. Numerous hepatic metastatic lesions, suspect that some may be increased in size but difficult to further characterize without intravenous contrast. Increased size of left pelvic soft tissue mass/metastatic lesion. Mass abuts and possibly invades the sigmoid colon.    02/20/2018 Imaging   Interval decrease in hepatic metastases.  Decreased mass or lymphadenopathy in the left external iliac chain. Interval resolution of left hydroureteronephrosis.  No new or progressive disease within the abdomen or pelvis.   05/16/2018 Imaging   05/16/2018 CT Abdomen IMPRESSION: 1. Slight interval decrease in size of hepatic metastatic disease. 2. Slight interval decrease in size centrally necrotic mass within the left external iliac region.   09/18/2018 Imaging   1. Today's study demonstrates a mixed response to therapy. Specifically, while the previously noted hepatic metastases appear decreased, the soft tissue mass along the left pelvic sidewall appears slightly larger than the prior study. No new metastatic disease is noted elsewhere in the abdomen or pelvis. 2. Aortic atherosclerosis. 3. Mild cardiomegaly. 4. Additional incidental findings, as above    03/12/2019 Imaging   1. Overall improvement with reduced size of the hepatic metastatic lesions, and reduced size of the irregular soft tissue mass in the left pelvis. 2. Other imaging findings of potential clinical significance: Mild distal esophageal wall thickening, cannot exclude esophagitis. Aortic Atherosclerosis (ICD10-I70.0). Lumbar impingement at L5-S1.     09/09/2019 Imaging   1. Stable size and appearance of the somewhat irregular left adnexal mass, currently 2.8 by 1.9 cm. 2. The scattered hepatic metastatic lesions are stable from 03/12/2019, and represent effectively  treated metastatic lesions. 3. Other imaging findings of potential clinical significance: Airway thickening is present, suggesting bronchitis or reactive airways disease. Lumbar spondylosis and degenerative disc disease causing impingement at L4-5 and L5-S1. Small umbilical hernia contains adipose tissue.   Aortic Atherosclerosis (ICD10-I70.0).     REVIEW OF SYSTEMS:   Constitutional: Denies fevers, chills or abnormal weight loss Eyes: Denies blurriness of vision Ears, nose, mouth, throat, and face: Denies mucositis or sore throat Respiratory: Denies cough, dyspnea or wheezes Cardiovascular: Denies palpitation, chest discomfort or lower extremity swelling Gastrointestinal:  Denies nausea, heartburn or change in bowel habits Skin: Denies abnormal skin rashes Lymphatics: Denies new lymphadenopathy or easy bruising Neurological:Denies numbness, tingling or new weaknesses Behavioral/Psych: Mood is stable, no new changes  All other systems were reviewed with the patient and are negative.  I have reviewed the past medical history, past surgical history, social history and family history with the patient and they are unchanged from previous note.  ALLERGIES:  is allergic to latex.  MEDICATIONS:  Current Outpatient Medications  Medication Sig Dispense Refill  . acetaminophen (TYLENOL) 325 MG tablet Take 650 mg by mouth every 6 (six) hours as needed for mild pain or moderate pain.     Marland Kitchen amoxicillin (AMOXIL) 500 MG capsule Take 2,000 mg by mouth See admin instructions. Prior to dental appointment, takes 4 tables prior to appt    . Biotin w/ Vitamins C & E (HAIR/SKIN/NAILS PO) Take 1 tablet by mouth daily.    Marland Kitchen  Calcium Carb-Cholecalciferol (CALCIUM 600 + D PO) Take 2 tablets by mouth daily.    . fluticasone (FLONASE) 50 MCG/ACT nasal spray Place 1 spray into both nostrils 2 (two) times daily as needed for allergies.   1  . gabapentin (NEURONTIN) 600 MG tablet TAKE 1 TABLET BY MOUTH TWICE A DAY  FOR RESTLESS LEGS AND NEUROPATHY.    Marland Kitchen HYDROcodone-acetaminophen (NORCO) 10-325 MG tablet Take 1 tablet by mouth every 6 (six) hours as needed. 30 tablet 0  . ibuprofen (ADVIL,MOTRIN) 200 MG tablet Take 400 mg by mouth every 8 (eight) hours as needed for mild pain.    Marland Kitchen levothyroxine (SYNTHROID) 175 MCG tablet Take 1 tablet (175 mcg total) by mouth daily before breakfast. (Patient taking differently: Take 175 mcg by mouth every other day. Take before breakfast every other day. Alternate with Synthroid 200 mcg.) 30 tablet 3  . levothyroxine (SYNTHROID) 200 MCG tablet Take 200 mcg by mouth every other day. Take every other day before breakfast/ alternate with Synthroid 175 mcg    . lidocaine-prilocaine (EMLA) cream APPLY TO AFFECTED AREA ONCE 30 g 3  . loratadine (CLARITIN) 10 MG tablet Take 10 mg by mouth daily.    Marland Kitchen LORazepam (ATIVAN) 1 MG tablet TAKE 1 TABLET (1 MG TOTAL) BY MOUTH EVERY 8 (EIGHT) HOURS AS NEEDED FOR ANXIETY OR SLEEP. 90 tablet 0  . losartan-hydrochlorothiazide (HYZAAR) 100-12.5 MG tablet Take 0.5 tablets by mouth daily. 60 tablet 9  . magnesium oxide (MAG-OX) 400 MG tablet Take 1 tablet by mouth 2 (two) times daily.    . Melatonin 10 MG CAPS Take 10 mg by mouth at bedtime.    . niacinamide 500 MG tablet Take 500 mg by mouth 3 (three) times daily with meals.    . ondansetron (ZOFRAN) 8 MG tablet Take 1 tablet (8 mg total) by mouth every 8 (eight) hours as needed for nausea. 60 tablet 1  . potassium gluconate 595 (99 K) MG TABS tablet Take by mouth.    . pramipexole (MIRAPEX) 0.5 MG tablet Take 1 mg by mouth at bedtime.    . traZODone (DESYREL) 50 MG tablet Take 1.5 tablets (75 mg total) by mouth at bedtime. (Patient taking differently: Take 50 mg by mouth at bedtime. ) 135 tablet 1   No current facility-administered medications for this visit.    PHYSICAL EXAMINATION: ECOG PERFORMANCE STATUS: 1 - Symptomatic but completely ambulatory  Vitals:   02/16/20 1016  BP: (!) 143/74   Pulse: 74  Resp: 17  Temp: (!) 97.3 F (36.3 C)  SpO2: 97%   Filed Weights   02/16/20 1016  Weight: 217 lb 3.2 oz (98.5 kg)    GENERAL:alert, no distress and comfortable SKIN: skin color, texture, turgor are normal, no rashes or significant lesions EYES: normal, Conjunctiva are pink and non-injected, sclera clear OROPHARYNX:no exudate, no erythema and lips, buccal mucosa, and tongue normal  NECK: supple, thyroid normal size, non-tender, without nodularity LYMPH:  no palpable lymphadenopathy in the cervical, axillary or inguinal LUNGS: clear to auscultation and percussion with normal breathing effort HEART: regular rate & rhythm and no murmurs and no lower extremity edema ABDOMEN:abdomen soft, non-tender and normal bowel sounds Musculoskeletal:no cyanosis of digits and no clubbing  NEURO: alert & oriented x 3 with fluent speech, no focal motor/sensory deficits  LABORATORY DATA:  I have reviewed the data as listed    Component Value Date/Time   NA 139 01/26/2020 1017   NA 141 07/09/2017 1312   K  4.0 01/26/2020 1017   K 3.9 07/09/2017 1312   CL 104 01/26/2020 1017   CO2 27 01/26/2020 1017   CO2 27 07/09/2017 1312   GLUCOSE 89 01/26/2020 1017   GLUCOSE 109 07/09/2017 1312   BUN 21 01/26/2020 1017   BUN 16.4 07/09/2017 1312   CREATININE 0.87 01/26/2020 1017   CREATININE 0.91 01/04/2019 1351   CREATININE 0.8 07/09/2017 1312   CALCIUM 9.8 01/26/2020 1017   CALCIUM 9.2 07/09/2017 1312   PROT 6.8 01/26/2020 1017   PROT 7.1 07/09/2017 1312   ALBUMIN 3.5 01/26/2020 1017   ALBUMIN 3.1 (L) 07/09/2017 1312   AST 14 (L) 01/26/2020 1017   AST 19 01/04/2019 1351   AST 35 (H) 07/09/2017 1312   ALT 11 01/26/2020 1017   ALT 20 01/04/2019 1351   ALT 43 07/09/2017 1312   ALKPHOS 114 01/26/2020 1017   ALKPHOS 182 (H) 07/09/2017 1312   BILITOT 0.6 01/26/2020 1017   BILITOT 0.3 01/04/2019 1351   BILITOT 0.34 07/09/2017 1312   GFRNONAA >60 01/26/2020 1017   GFRNONAA >60 01/04/2019  1351   GFRAA >60 01/26/2020 1017   GFRAA >60 01/04/2019 1351    No results found for: SPEP, UPEP  Lab Results  Component Value Date   WBC 7.8 02/16/2020   NEUTROABS 3.9 02/16/2020   HGB 11.7 (L) 02/16/2020   HCT 35.7 (L) 02/16/2020   MCV 82.3 02/16/2020   PLT 186 02/16/2020      Chemistry      Component Value Date/Time   NA 139 01/26/2020 1017   NA 141 07/09/2017 1312   K 4.0 01/26/2020 1017   K 3.9 07/09/2017 1312   CL 104 01/26/2020 1017   CO2 27 01/26/2020 1017   CO2 27 07/09/2017 1312   BUN 21 01/26/2020 1017   BUN 16.4 07/09/2017 1312   CREATININE 0.87 01/26/2020 1017   CREATININE 0.91 01/04/2019 1351   CREATININE 0.8 07/09/2017 1312      Component Value Date/Time   CALCIUM 9.8 01/26/2020 1017   CALCIUM 9.2 07/09/2017 1312   ALKPHOS 114 01/26/2020 1017   ALKPHOS 182 (H) 07/09/2017 1312   AST 14 (L) 01/26/2020 1017   AST 19 01/04/2019 1351   AST 35 (H) 07/09/2017 1312   ALT 11 01/26/2020 1017   ALT 20 01/04/2019 1351   ALT 43 07/09/2017 1312   BILITOT 0.6 01/26/2020 1017   BILITOT 0.3 01/04/2019 1351   BILITOT 0.34 07/09/2017 1312       RADIOGRAPHIC STUDIES: I have personally reviewed the radiological images as listed and agreed with the findings in the report. XR Ankle 2 Views Right  Result Date: 02/14/2020 2 views of her ankle demonstrate well-maintained alignment hardware is intact in place no acute osseous injuries  XR Ankle 2 Views Right  Result Date: 01/18/2020 2 view radiographs of the right ankle shows a congruent mortise no bony abnormalities no fractures.  Radiograph shows 2 cannulated screws from a previous medial malleolar fracture without displacement

## 2020-02-16 NOTE — Patient Instructions (Signed)

## 2020-02-16 NOTE — Assessment & Plan Note (Signed)
She has multifactorial anemia, component of anemia chronic disease and iron deficiency Observe for now I plan to recheck her iron study in her next visit

## 2020-02-16 NOTE — Assessment & Plan Note (Addendum)
The liver function tests are stable We will observe closely

## 2020-02-17 ENCOUNTER — Telehealth: Payer: Self-pay

## 2020-02-17 ENCOUNTER — Other Ambulatory Visit: Payer: Self-pay

## 2020-02-17 MED ORDER — LEVOTHYROXINE SODIUM 200 MCG PO TABS: 200.0000 ug | ORAL_TABLET | Freq: Every day | ORAL | 3 refills | Status: DC

## 2020-02-17 NOTE — Telephone Encounter (Signed)
-----   Message from Heath Lark, MD sent at 02/17/2020 10:26 AM EDT ----- Regarding: TSH is high Can you verify she is taking 175 mcg alternate with 200 mcg? If yes, I recommend 200 mcg daily Please modify her med list and call in refill if needed

## 2020-02-17 NOTE — Telephone Encounter (Signed)
Called and given below message. She verbalized understanding. She is taking 175 mcg daily. She will start the 200 mcg daily. New Rx sent to pharmacy.

## 2020-03-07 ENCOUNTER — Encounter (HOSPITAL_COMMUNITY): Payer: Self-pay

## 2020-03-07 ENCOUNTER — Ambulatory Visit (HOSPITAL_COMMUNITY)
Admission: RE | Admit: 2020-03-07 | Discharge: 2020-03-07 | Disposition: A | Discharge status: Home / Self Care | Payer: Medicare Other | Source: Ambulatory Visit | Attending: Hematology and Oncology | Admitting: Hematology and Oncology

## 2020-03-07 ENCOUNTER — Other Ambulatory Visit: Payer: Self-pay

## 2020-03-07 ENCOUNTER — Other Ambulatory Visit: Payer: Self-pay | Admitting: Hematology and Oncology

## 2020-03-07 DIAGNOSIS — C541 Malignant neoplasm of endometrium: Secondary | ICD-10-CM | POA: Insufficient documentation

## 2020-03-07 MED ORDER — IOHEXOL 300 MG/ML  SOLN
100.0000 mL | Freq: Once | INTRAMUSCULAR | Status: AC | PRN
  Administered 2020-03-07: 100 mL via INTRAVENOUS

## 2020-03-07 MED ORDER — HEPARIN SOD (PORK) LOCK FLUSH 100 UNIT/ML IV SOLN
INTRAVENOUS | Status: AC
  Administered 2020-03-07: 500 [IU] via INTRAVENOUS
  Filled 2020-03-07: qty 5

## 2020-03-07 MED ORDER — SODIUM CHLORIDE (PF) 0.9 % IJ SOLN
INTRAMUSCULAR | Status: AC
  Filled 2020-03-07: qty 50

## 2020-03-07 MED ORDER — HEPARIN SOD (PORK) LOCK FLUSH 100 UNIT/ML IV SOLN: 500.0000 [IU] | Freq: Once | INTRAVENOUS | Status: AC

## 2020-03-08 ENCOUNTER — Inpatient Hospital Stay: Payer: Medicare Other

## 2020-03-08 ENCOUNTER — Inpatient Hospital Stay: Payer: Medicare Other | Attending: Hematology and Oncology

## 2020-03-08 ENCOUNTER — Inpatient Hospital Stay (HOSPITAL_BASED_OUTPATIENT_CLINIC_OR_DEPARTMENT_OTHER): Payer: Medicare Other | Admitting: Hematology and Oncology

## 2020-03-08 ENCOUNTER — Other Ambulatory Visit: Payer: Self-pay

## 2020-03-08 ENCOUNTER — Encounter: Payer: Self-pay | Admitting: Hematology and Oncology

## 2020-03-08 DIAGNOSIS — C787 Secondary malignant neoplasm of liver and intrahepatic bile duct: Secondary | ICD-10-CM | POA: Diagnosis present

## 2020-03-08 DIAGNOSIS — K130 Diseases of lips: Secondary | ICD-10-CM | POA: Insufficient documentation

## 2020-03-08 DIAGNOSIS — C541 Malignant neoplasm of endometrium: Secondary | ICD-10-CM

## 2020-03-08 DIAGNOSIS — Z923 Personal history of irradiation: Secondary | ICD-10-CM | POA: Diagnosis not present

## 2020-03-08 DIAGNOSIS — Z23 Encounter for immunization: Secondary | ICD-10-CM | POA: Diagnosis not present

## 2020-03-08 DIAGNOSIS — E039 Hypothyroidism, unspecified: Secondary | ICD-10-CM

## 2020-03-08 DIAGNOSIS — G2581 Restless legs syndrome: Secondary | ICD-10-CM

## 2020-03-08 DIAGNOSIS — Z9221 Personal history of antineoplastic chemotherapy: Secondary | ICD-10-CM | POA: Insufficient documentation

## 2020-03-08 DIAGNOSIS — D509 Iron deficiency anemia, unspecified: Secondary | ICD-10-CM

## 2020-03-08 DIAGNOSIS — Z5112 Encounter for antineoplastic immunotherapy: Secondary | ICD-10-CM | POA: Insufficient documentation

## 2020-03-08 DIAGNOSIS — Z79899 Other long term (current) drug therapy: Secondary | ICD-10-CM | POA: Diagnosis not present

## 2020-03-08 LAB — COMPREHENSIVE METABOLIC PANEL
ALT: 12 U/L (ref 0–44)
AST: 16 U/L (ref 15–41)
Albumin: 3.2 g/dL — ABNORMAL LOW (ref 3.5–5.0)
Alkaline Phosphatase: 118 U/L (ref 38–126)
Anion gap: 8 (ref 5–15)
BUN: 15 mg/dL (ref 8–23)
CO2: 27 mmol/L (ref 22–32)
Calcium: 10 mg/dL (ref 8.9–10.3)
Chloride: 105 mmol/L (ref 98–111)
Creatinine, Ser: 0.74 mg/dL (ref 0.44–1.00)
GFR calc Af Amer: >60 mL/min (ref >60)
GFR calc non Af Amer: >60 mL/min (ref >60)
Glucose, Bld: 95 mg/dL (ref 70–99)
Potassium: 3.8 mmol/L (ref 3.5–5.1)
Sodium: 140 mmol/L (ref 135–145)
Total Bilirubin: 0.5 mg/dL (ref 0.3–1.2)
Total Protein: 6.3 g/dL — ABNORMAL LOW (ref 6.5–8.1)

## 2020-03-08 LAB — IRON AND TIBC
Iron: 76 ug/dL (ref 41–142)
Saturation Ratios: 25 % (ref 21–57)
TIBC: 309 ug/dL (ref 236–444)
UIBC: 233 ug/dL (ref 120–384)

## 2020-03-08 LAB — CBC WITH DIFFERENTIAL/PLATELET
Abs Immature Granulocytes: 0.02 10*3/uL (ref 0.00–0.07)
Basophils Absolute: 0.1 10*3/uL (ref 0.0–0.1)
Basophils Relative: 1 %
Eosinophils Absolute: 2 10*3/uL — ABNORMAL HIGH (ref 0.0–0.5)
Eosinophils Relative: 26 %
HCT: 36.6 % (ref 36.0–46.0)
Hemoglobin: 12.1 g/dL (ref 12.0–15.0)
Immature Granulocytes: 0 %
Lymphocytes Relative: 18 %
Lymphs Abs: 1.4 10*3/uL (ref 0.7–4.0)
MCH: 27.2 pg (ref 26.0–34.0)
MCHC: 33.1 g/dL (ref 30.0–36.0)
MCV: 82.2 fL (ref 80.0–100.0)
Monocytes Absolute: 0.5 10*3/uL (ref 0.1–1.0)
Monocytes Relative: 6 %
Neutro Abs: 3.7 10*3/uL (ref 1.7–7.7)
Neutrophils Relative %: 49 %
Platelets: 222 10*3/uL (ref 150–400)
RBC: 4.45 MIL/uL (ref 3.87–5.11)
RDW: 14.3 % (ref 11.5–15.5)
WBC: 7.5 10*3/uL (ref 4.0–10.5)
nRBC: 0 % (ref 0.0–0.2)

## 2020-03-08 LAB — TSH: TSH: 3.036 u[IU]/mL (ref 0.308–3.960)

## 2020-03-08 LAB — FERRITIN: Ferritin: 67 ng/mL (ref 11–307)

## 2020-03-08 MED ORDER — SODIUM CHLORIDE 0.9 % IV SOLN
200.0000 mg | Freq: Once | INTRAVENOUS | Status: AC
  Administered 2020-03-08: 200 mg via INTRAVENOUS
  Filled 2020-03-08: qty 8

## 2020-03-08 MED ORDER — GABAPENTIN 600 MG PO TABS: ORAL_TABLET | ORAL | 11 refills | Status: DC

## 2020-03-08 MED ORDER — SODIUM CHLORIDE 0.9% FLUSH
10.0000 mL | INTRAVENOUS | Status: DC | PRN
  Administered 2020-03-08: 10 mL
  Filled 2020-03-08: qty 10

## 2020-03-08 MED ORDER — SODIUM CHLORIDE 0.9 % IV SOLN
Freq: Once | INTRAVENOUS | Status: AC
  Filled 2020-03-08: qty 250

## 2020-03-08 MED ORDER — HEPARIN SOD (PORK) LOCK FLUSH 100 UNIT/ML IV SOLN
500.0000 [IU] | Freq: Once | INTRAVENOUS | Status: AC | PRN
  Administered 2020-03-08: 500 [IU]
  Filled 2020-03-08: qty 5

## 2020-03-08 NOTE — Patient Instructions (Signed)

## 2020-03-08 NOTE — Progress Notes (Signed)
   Covid-19 Vaccination Clinic  Name:  ULDINE FUSTER    MRN: [id number] DOB: 09-07-1945  03/08/2020  Ms. Janosik was observed post Covid-19 immunization for 15 minutes without incident. She was provided with Vaccine Information Sheet and instruction to access the V-Safe system.   Ms. Hinesley was instructed to call 911 with any severe reactions post vaccine: Marland Kitchen Difficulty breathing  . Swelling of face and throat  . A fast heartbeat  . A bad rash all over body  . Dizziness and weakness

## 2020-03-08 NOTE — Progress Notes (Signed)
[place] OFFICE PROGRESS NOTE  Patient Care Team: [name], MD as PCP - General (Family Medicine)  ASSESSMENT & PLAN:  Recurrent carcinoma of endometrium [facility]) I have reviewed multiple imaging studies She has no signs of cancer progression We will continue pembrolizumab indefinitely I will see her every other treatment The next imaging study would be around March 2022  Metastasis to liver [facility]) Liver enzymes are stable CT imaging show no evidence of liver metastasis or changes  Restless leg syndrome She has chronic restless leg syndrome I will recheck iron studies She is taking medications for this  Lip lesion I am concerned about malignancy I will refer her to oromaxillary facial surgeon for further evaluation She will likely need a biopsy  Acquired hypothyroidism Her recent TSH is intermittently elevated She will continue chronic thyroid replacement therapy We will adjust her thyroid medicine as needed   Orders Placed This Encounter  Procedures  . Ambulatory referral to Oral Maxillofacial Surgery    Referral Priority:   Routine    Referral Type:   Surgical    Referral Reason:   Specialty Services Required    Referred to Provider:   Frederik Schmidt, MD    Requested Specialty:   Oral Surgery    Number of Visits Requested:   1    All questions were answered. The patient knows to call the clinic with any problems, questions or concerns. The total time spent in the appointment was 30 minutes encounter with patients including review of chart and various tests results, discussions about plan of care and coordination of care plan   Heath Lark, MD 03/08/2020 11:51 AM  INTERVAL HISTORY: Please see below for problem oriented charting. She returns for further follow-up She is concerned about a lesion on the right lower lip that has been present for about 3 months It bled intermittently From treatment standpoint, she tolerated treatment well No infusion  reactions Denies abdominal pain no changes in bowel habits She continues to have intermittent restless leg syndrome  SUMMARY OF ONCOLOGIC HISTORY: Oncology History Overview Note  MSI stable on June 2017 tissue but high from [facility] study from November 2017  05/15/16: ER is moderately positive (70%). PR is strongly positive (80%).   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 Lymph node, sentinel, biopsy, right obturator ONE BENIGN LYMPH NODE (0/1) 3. Lymph nodes, regional resection, left pelvic FOUR BENIGN LYMPH NODES (0/4)   01/02/2016 Surgery   Dr. Denman George performed robotic-assisted laparoscopic total hysterectomy with bilateral salpingoophorectomy, sentinel lymph node biopsy, lymphadenectomy     05/15/2016 Pathology Results   Vagina, biopsy, mid - ADENOCARCINOMA, SEE COMMENT. Microscopic Comment The morphology along with the patient's history are consistent with recurrent endometrioid adenocarcinoma. The carcinoma has a similar appearance to the primary ([id number]).   05/20/2016 Imaging   Ct scan abdomen showed solid 2.5 cm peritoneal mass in the mid to anterior left pelvis, suspicious for peritoneal metastasis. 2. Small expansile low-attenuation filling defect in the left external iliac vein, cannot exclude a small deep venous thrombus. Consider correlation with left lower extremity venous Doppler scan. 3. Small simple fluid density structure in the left pelvic sidewall abutting the left external iliac vessels, favor a small postoperative  seroma. 4. No ascites. 5. No lymphadenopathy.  No metastatic disease in the chest. 6. Aortic atherosclerosis.    06/12/2016 PET scan   Intensely hypermetabolic 2.1 cm central pelvic peritoneal mass just to the left of midline, consistent with peritoneal metastatic recurrence. No ascites. 2. No additional hypermetabolic sites of metastatic disease. 3. Diffuse thyroid hypermetabolism without discrete thyroid nodule, favoring thyroiditis. Recommend correlation with serum thyroid function tests.   06/24/2016 - 07/22/2016 Chemotherapy   The patient had weekly cisplatin. She has missed several doses due to infection and pancytopenia   06/24/2016 - 09/04/2016 Radiation Therapy   She completed concurrent radiation therapy Radiation treatment dates:   IMRT : 06/24/16 - 08/01/16 HDR : 08/13/16, 08/20/16, 08/27/16, 09/04/16  Site/dose:   Pelvis treated to 55 Gy in 25 fractions (simultaneous integrated boost technique) Vaginal Cuff treated to 24 Gy in 4 fractions   08/15/2016 - 12/03/2016 Chemotherapy   She received 6 cycles of carboplatin/Taxol   09/27/2016 Imaging   Interval decrease in size of previously described solid peritoneal nodule within the left anterior pelvis. Near complete resolution of previously described low-attenuation structure along the left pelvic sidewall. No evidence for metastatic disease in the chest. Aortic atherosclerosis.   12/30/2016 Imaging   Ct abdomen 1. Solitary left pelvic peritoneal implant is mildly decreased in size in the interval. 2. No new or progressive metastatic disease in the abdomen or pelvis. No ascites. 3. Aortic atherosclerosis.   04/02/2017 Imaging   Left lower quadrant peritoneal implant referenced on previous exam measures 1.7 x 1.3 cm, image 69 of series 2. Increased from 0.8 x 0.8 cm previously peer no new peritoneal implants identified.  Musculoskeletal: The degenerative disc disease noted within the lumbar spine.  IMPRESSION: 1. Solitary left pelvic peritoneal implant is increased in size in the interval. 2. No new sites of disease.  No ascites. 3. Aortic  atherosclerosis   04/11/2017 PET scan   1. The left side of pelvis peritoneal implant has decreased in size and degree of FDG uptake compatible with response to therapy. No new areas of peritoneal disease identified. 2. Persistent diffuse increased uptake within the thyroid gland. Correlation with patient's thyroid function may be helpful.   05/26/2017 Imaging   1. Enlarging tumor implant along the left adnexa, currently 2.6 by 2.4 cm and previously 1.9 by 1.3 cm. No new tumor implant or other specific cause for the patient's pelvic symptoms is currently identified. 2.  Aortic Atherosclerosis (ICD10-I70.0). 3. Lumbar spondylosis and degenerative disc disease causing multilevel impingement.   06/04/2017 - 08/28/2017 Chemotherapy   The patient started letrozole and everolimus   08/26/2017 Imaging   Increased size of mass in the left adnexal region.  Several new small liver metastases in right hepatic lobe   09/08/2017 Imaging   LV EF: 60% -  65%   09/10/2017 Procedure   Technically successful right IJ power-injectable port catheter placement. Ready for routine use   12/04/2017 Imaging   1. Interval increase in size and number of multiple lesions within the liver compatible with hepatic metastatic disease. 2. Interval increase in size mass within the left hemipelvis.   12/15/2017 -  Chemotherapy   The patient had pembrolizumab (KEYTRUDA) 200 mg in sodium chloride 0.9 % 50 mL chemo infusion, 200 mg, Intravenous, Once, 1 of 6 cycles Administration: 200 mg (12/15/2017)  for chemotherapy treatment.    12/21/2017 Imaging   1. Moderate left hydronephrosis and hydroureter, similar compared to most recent CT from May 2019; obstruction appears to  be secondary to a left pelvic mass/metastatic focus which has increased in size since the prior CT. 2. Numerous hepatic metastatic lesions, suspect that some may be increased in size but difficult to further characterize without intravenous contrast.  Increased size of left pelvic soft tissue mass/metastatic lesion. Mass abuts and possibly invades the sigmoid colon.    02/20/2018 Imaging   Interval decrease in hepatic metastases.  Decreased mass or lymphadenopathy in the left external iliac chain. Interval resolution of left hydroureteronephrosis.  No new or progressive disease within the abdomen or pelvis.   05/16/2018 Imaging   05/16/2018 CT Abdomen IMPRESSION: 1. Slight interval decrease in size of hepatic metastatic disease. 2. Slight interval decrease in size centrally necrotic mass within the left external iliac region.   09/18/2018 Imaging   1. Today's study demonstrates a mixed response to therapy. Specifically, while the previously noted hepatic metastases appear decreased, the soft tissue mass along the left pelvic sidewall appears slightly larger than the prior study. No new metastatic disease is noted elsewhere in the abdomen or pelvis. 2. Aortic atherosclerosis. 3. Mild cardiomegaly. 4. Additional incidental findings, as above    03/12/2019 Imaging   1. Overall improvement with reduced size of the hepatic metastatic lesions, and reduced size of the irregular soft tissue mass in the left pelvis. 2. Other imaging findings of potential clinical significance: Mild distal esophageal wall thickening, cannot exclude esophagitis. Aortic Atherosclerosis (ICD10-I70.0). Lumbar impingement at L5-S1.     09/09/2019 Imaging   1. Stable size and appearance of the somewhat irregular left adnexal mass, currently 2.8 by 1.9 cm. 2. The scattered hepatic metastatic lesions are stable from 03/12/2019, and represent effectively treated metastatic lesions. 3. Other imaging findings of potential clinical significance: Airway thickening is present, suggesting bronchitis or reactive airways disease. Lumbar spondylosis and degenerative disc disease causing impingement at L4-5 and L5-S1. Small umbilical hernia contains adipose tissue.   Aortic  Atherosclerosis (ICD10-I70.0).   03/07/2020 Imaging   1. Stable appearance of LEFT pelvic mass without new signs of disease. Mass remains closely approximated to the colon, in the vicinity of the distal LEFT ureter and the iliac vessels. 2. Stable signs of hepatic metastatic disease with calcified lesion at the dome of the liver and subtle areas of low attenuation elsewhere which show no change.     REVIEW OF SYSTEMS:   Constitutional: Denies fevers, chills or abnormal weight loss Eyes: Denies blurriness of vision Ears, nose, mouth, throat, and face: Denies mucositis or sore throat Respiratory: Denies cough, dyspnea or wheezes Cardiovascular: Denies palpitation, chest discomfort or lower extremity swelling Gastrointestinal:  Denies nausea, heartburn or change in bowel habits Skin: Denies abnormal skin rashes Lymphatics: Denies new lymphadenopathy or easy bruising Neurological:Denies numbness, tingling or new weaknesses Behavioral/Psych: Mood is stable, no new changes  All other systems were reviewed with the patient and are negative.  I have reviewed the past medical history, past surgical history, social history and family history with the patient and they are unchanged from previous note.  ALLERGIES:  is allergic to latex.  MEDICATIONS:  Current Outpatient Medications  Medication Sig Dispense Refill  . acetaminophen (TYLENOL) 325 MG tablet Take 650 mg by mouth every 6 (six) hours as needed for mild pain or moderate pain.     Marland Kitchen amoxicillin (AMOXIL) 500 MG capsule Take 2,000 mg by mouth See admin instructions. Prior to dental appointment, takes 4 tables prior to appt    . Biotin w/ Vitamins C & E (HAIR/SKIN/NAILS PO) Take 1  tablet by mouth daily.    . Calcium Carb-Cholecalciferol (CALCIUM 600 + D PO) Take 2 tablets by mouth daily.    . fluticasone (FLONASE) 50 MCG/ACT nasal spray Place 1 spray into both nostrils 2 (two) times daily as needed for allergies.   1  . gabapentin  (NEURONTIN) 600 MG tablet TAKE 1 TABLET BY MOUTH TWICE A DAY FOR RESTLESS LEGS AND NEUROPATHY. 60 tablet 11  . HYDROcodone-acetaminophen (NORCO) 10-325 MG tablet Take 1 tablet by mouth every 6 (six) hours as needed. 30 tablet 0  . ibuprofen (ADVIL,MOTRIN) 200 MG tablet Take 400 mg by mouth every 8 (eight) hours as needed for mild pain.    Marland Kitchen levothyroxine (SYNTHROID) 200 MCG tablet Take 1 tablet (200 mcg total) by mouth daily. 30 tablet 3  . lidocaine-prilocaine (EMLA) cream APPLY TO AFFECTED AREA ONCE 30 g 3  . loratadine (CLARITIN) 10 MG tablet Take 10 mg by mouth daily.    Marland Kitchen LORazepam (ATIVAN) 1 MG tablet TAKE 1 TABLET (1 MG TOTAL) BY MOUTH EVERY 8 (EIGHT) HOURS AS NEEDED FOR ANXIETY OR SLEEP. 90 tablet 0  . losartan-hydrochlorothiazide (HYZAAR) 100-12.5 MG tablet Take 0.5 tablets by mouth daily. 60 tablet 9  . magnesium oxide (MAG-OX) 400 MG tablet Take 1 tablet by mouth 2 (two) times daily.    . Melatonin 10 MG CAPS Take 10 mg by mouth at bedtime.    . niacinamide 500 MG tablet Take 500 mg by mouth 3 (three) times daily with meals.    . ondansetron (ZOFRAN) 8 MG tablet Take 1 tablet (8 mg total) by mouth every 8 (eight) hours as needed for nausea. 60 tablet 1  . potassium gluconate 595 (99 K) MG TABS tablet Take by mouth.    . pramipexole (MIRAPEX) 0.5 MG tablet Take 1 mg by mouth at bedtime.    . traZODone (DESYREL) 50 MG tablet Take 1.5 tablets (75 mg total) by mouth at bedtime. (Patient taking differently: Take 50 mg by mouth at bedtime. ) 135 tablet 1   No current facility-administered medications for this visit.    PHYSICAL EXAMINATION: ECOG PERFORMANCE STATUS: 1 - Symptomatic but completely ambulatory  Vitals:   03/08/20 1058  BP: (!) 167/76  Pulse: 72  Resp: 16  Temp: (!) 97.4 F (36.3 C)  SpO2: 99%   Filed Weights   03/08/20 1058  Weight: 213 lb (96.6 kg)    GENERAL:alert, no distress and comfortable SKIN: skin color, texture, turgor are normal, no rashes or  significant lesions EYES: normal, Conjunctiva are pink and non-injected, sclera clear OROPHARYNX: Noted a lesion on her right lower lip NECK: supple, thyroid normal size, non-tender, without nodularity LYMPH:  no palpable lymphadenopathy in the cervical, axillary or inguinal LUNGS: clear to auscultation and percussion with normal breathing effort HEART: regular rate & rhythm and no murmurs and no lower extremity edema ABDOMEN:abdomen soft, non-tender and normal bowel sounds Musculoskeletal:no cyanosis of digits and no clubbing  NEURO: alert & oriented x 3 with fluent speech, no focal motor/sensory deficits  LABORATORY DATA:  I have reviewed the data as listed    Component Value Date/Time   NA 140 03/08/2020 1024   NA 141 07/09/2017 1312   K 3.8 03/08/2020 1024   K 3.9 07/09/2017 1312   CL 105 03/08/2020 1024   CO2 27 03/08/2020 1024   CO2 27 07/09/2017 1312   GLUCOSE 95 03/08/2020 1024   GLUCOSE 109 07/09/2017 1312   BUN 15 03/08/2020 1024   BUN  16.4 07/09/2017 1312   CREATININE 0.74 03/08/2020 1024   CREATININE 0.91 01/04/2019 1351   CREATININE 0.8 07/09/2017 1312   CALCIUM 10.0 03/08/2020 1024   CALCIUM 9.2 07/09/2017 1312   PROT 6.3 (L) 03/08/2020 1024   PROT 7.1 07/09/2017 1312   ALBUMIN 3.2 (L) 03/08/2020 1024   ALBUMIN 3.1 (L) 07/09/2017 1312   AST 16 03/08/2020 1024   AST 19 01/04/2019 1351   AST 35 (H) 07/09/2017 1312   ALT 12 03/08/2020 1024   ALT 20 01/04/2019 1351   ALT 43 07/09/2017 1312   ALKPHOS 118 03/08/2020 1024   ALKPHOS 182 (H) 07/09/2017 1312   BILITOT 0.5 03/08/2020 1024   BILITOT 0.3 01/04/2019 1351   BILITOT 0.34 07/09/2017 1312   GFRNONAA >60 03/08/2020 1024   GFRNONAA >60 01/04/2019 1351   GFRAA >60 03/08/2020 1024   GFRAA >60 01/04/2019 1351    No results found for: SPEP, UPEP  Lab Results  Component Value Date   WBC 7.5 03/08/2020   NEUTROABS 3.7 03/08/2020   HGB 12.1 03/08/2020   HCT 36.6 03/08/2020   MCV 82.2 03/08/2020   PLT  222 03/08/2020      Chemistry      Component Value Date/Time   NA 140 03/08/2020 1024   NA 141 07/09/2017 1312   K 3.8 03/08/2020 1024   K 3.9 07/09/2017 1312   CL 105 03/08/2020 1024   CO2 27 03/08/2020 1024   CO2 27 07/09/2017 1312   BUN 15 03/08/2020 1024   BUN 16.4 07/09/2017 1312   CREATININE 0.74 03/08/2020 1024   CREATININE 0.91 01/04/2019 1351   CREATININE 0.8 07/09/2017 1312      Component Value Date/Time   CALCIUM 10.0 03/08/2020 1024   CALCIUM 9.2 07/09/2017 1312   ALKPHOS 118 03/08/2020 1024   ALKPHOS 182 (H) 07/09/2017 1312   AST 16 03/08/2020 1024   AST 19 01/04/2019 1351   AST 35 (H) 07/09/2017 1312   ALT 12 03/08/2020 1024   ALT 20 01/04/2019 1351   ALT 43 07/09/2017 1312   BILITOT 0.5 03/08/2020 1024   BILITOT 0.3 01/04/2019 1351   BILITOT 0.34 07/09/2017 1312       Patient gave permission for the pictures to be taken  RADIOGRAPHIC STUDIES: I have reviewed multiple imaging studies with the patient and family I have personally reviewed the radiological images as listed and agreed with the findings in the report. CT ABDOMEN PELVIS W CONTRAST  Result Date: 03/07/2020 CLINICAL DATA:  Restaging of endometrial cancer. EXAM: CT ABDOMEN AND PELVIS WITH CONTRAST TECHNIQUE: Multidetector CT imaging of the abdomen and pelvis was performed using the standard protocol following bolus administration of intravenous contrast. CONTRAST:  128m OMNIPAQUE IOHEXOL 300 MG/ML  SOLN COMPARISON:  September 09, 2019 FINDINGS: Lower chest: Basilar scarring similar to the prior study associated with minimal atelectasis. No consolidation. Hepatobiliary: Small low-density lesion in the RIGHT hepatic lobe is unchanged (image 14, series 2) small calcified area in the dome of the RIGHT hemi liver measuring approximately 7 mm is also not changed. No new or suspicious finding in the liver. Portal vein is patent. Hepatic veins are patent. 7 mm lesion posterior RIGHT hemi liverw unchanged (image  26, series 2) Pancreas: Pancreas without ductal dilation or sign of inflammation. Spleen: Spleen normal in size and contour. Adrenals/Urinary Tract: Adrenal glands are normal. Renal contours are smooth. No hydronephrosis. Soft tissue remains present along the course of the LEFT ureter, associated with the LEFT iliac  vasculature hand:, see below. Stomach/Bowel: Normal appendix. No sign of bowel obstruction or acute bowel process. Sigmoid is tethered to soft tissue in the LEFT pelvis as before. Vascular/Lymphatic: Minimal calcified atheromatous plaque of the abdominal aorta. LEFT pelvic sidewall soft tissue or lymph node measuring 2.8 by 1.7 cm previously approximately 3.2 x 1.7 cm, involving the colon, iliac vessels and in close proximity to the LEFT ureter diminished in size considerably compared to more remote studies from 2020. Reproductive: Post hysterectomy. Soft tissue in the LEFT pelvis as discussed. Other: No abdominal ascites. No peritoneal nodularity. Small fat containing umbilical hernia. Musculoskeletal: Spinal degenerative changes. No acute or destructive bone process. IMPRESSION: 1. Stable appearance of LEFT pelvic mass without new signs of disease. Mass remains closely approximated to the colon, in the vicinity of the distal LEFT ureter and the iliac vessels. 2. Stable signs of hepatic metastatic disease with calcified lesion at the dome of the liver and subtle areas of low attenuation elsewhere which show no change. Electronically Signed   By: Zetta Bills M.D.   On: 03/07/2020 16:32   XR Ankle 2 Views Right  Result Date: 02/14/2020 2 views of her ankle demonstrate well-maintained alignment hardware is intact in place no acute osseous injuries

## 2020-03-08 NOTE — Assessment & Plan Note (Signed)
I am concerned about malignancy I will refer her to oromaxillary facial surgeon for further evaluation She will likely need a biopsy

## 2020-03-08 NOTE — Assessment & Plan Note (Signed)
Her recent TSH is intermittently elevated She will continue chronic thyroid replacement therapy We will adjust her thyroid medicine as needed 

## 2020-03-08 NOTE — Patient Instructions (Signed)
[facility] Discharge Instructions for Patients Receiving Chemotherapy  Today you received the following chemotherapy agents:  Keytruda.  To help prevent nausea and vomiting after your treatment, we encourage you to take your nausea medication as directed.   If you develop nausea and vomiting that is not controlled by your nausea medication, call the clinic.   BELOW ARE SYMPTOMS THAT SHOULD BE REPORTED IMMEDIATELY:  *FEVER GREATER THAN 100.5 F  *CHILLS WITH OR WITHOUT FEVER  NAUSEA AND VOMITING THAT IS NOT CONTROLLED WITH YOUR NAUSEA MEDICATION  *UNUSUAL SHORTNESS OF BREATH  *UNUSUAL BRUISING OR BLEEDING  TENDERNESS IN MOUTH AND THROAT WITH OR WITHOUT PRESENCE OF ULCERS  *URINARY PROBLEMS  *BOWEL PROBLEMS  UNUSUAL RASH Items with * indicate a potential emergency and should be followed up as soon as possible.  Feel free to call the clinic should you have any questions or concerns. The clinic phone number is [phone].  Please show the CHEMO ALERT CARD at check-in to the Emergency Department and triage nurse.    

## 2020-03-08 NOTE — Assessment & Plan Note (Signed)
Liver enzymes are stable CT imaging show no evidence of liver metastasis or changes

## 2020-03-08 NOTE — Assessment & Plan Note (Signed)
I have reviewed multiple imaging studies She has no signs of cancer progression We will continue pembrolizumab indefinitely I will see her every other treatment The next imaging study would be around March 2022

## 2020-03-08 NOTE — Assessment & Plan Note (Signed)
She has chronic restless leg syndrome I will recheck iron studies She is taking medications for this

## 2020-03-08 NOTE — Progress Notes (Signed)
Per Dr Alvy Bimler, pt needs to be seen at Dr Raynelle Dick office for lesion to (r) bottom lip. Referral, [place] note, and pt demographics faxed to [phone]; fax confirmation received. Pt was scheduled for 9/13 at 1015; pt is aware.

## 2020-03-20 ENCOUNTER — Ambulatory Visit: Payer: Medicare Other | Admitting: Physician Assistant

## 2020-03-28 ENCOUNTER — Encounter: Payer: Self-pay | Admitting: Physician Assistant

## 2020-03-28 ENCOUNTER — Ambulatory Visit (INDEPENDENT_AMBULATORY_CARE_PROVIDER_SITE_OTHER): Payer: Medicare Other | Admitting: Physician Assistant

## 2020-03-28 DIAGNOSIS — M76821 Posterior tibial tendinitis, right leg: Secondary | ICD-10-CM | POA: Diagnosis not present

## 2020-03-28 NOTE — Progress Notes (Signed)
Office Visit Note   Patient: Michelle Rosario           Date of Birth: 26-Dec-1945           MRN: [id number] Visit Date: 03/28/2020              Requested by: [name], [address] [place],  [place] [address] PCP: [name], MD  Chief Complaint  Patient presents with  . Right Ankle - Follow-up      HPI: This is a pleasant 74 year old woman who follows up for her right ankle posterior tibial tendinitis.  She is doing very well and has weaned into shoes that have arch supports.  She currently does not have any pain  Assessment & Plan: Visit Diagnoses:  1. Posterior tibial tendon dysfunction (PTTD) of right lower extremity     Plan: She may follow-up as needed.  If she had the return of pain she could try some anti-inflammatories and use her brace again.  For now she will continue to use shoes with good arch support Follow-Up Instructions: Return if symptoms worsen or fail to improve.   Ortho Exam  Patient is alert, oriented, no adenopathy, well-dressed, normal affect, normal respiratory effort. Focused examination demonstrates minimal soft tissue swelling no cellulitis good strength with inversion without pain no tenderness along the posterior tibial tendon no lateral impingement pain  Imaging: No results found. No images are attached to the encounter.  Labs: Lab Results  Component Value Date   REPTSTATUS 09/01/2016 FINAL 08/30/2016   CULT  08/30/2016    NO MRSA DETECTED Performed at [facility], [place] [address], [place], [place] [address]      Lab Results  Component Value Date   ALBUMIN 3.2 (L) 03/08/2020   ALBUMIN 3.1 (L) 02/16/2020   ALBUMIN 3.5 01/26/2020    Lab Results  Component Value Date   MG 1.8 07/29/2016   MG 2.0 07/22/2016   MG 1.9 07/18/2016   No results found for: VD25OH  No results found for: PREALBUMIN CBC EXTENDED Latest Ref Rng & Units 03/08/2020 02/16/2020 01/26/2020  WBC 4.0 - 10.5 K/uL 7.5 7.8 7.5  RBC 3.87 - 5.11  MIL/uL 4.45 4.34 4.37  HGB 12.0 - 15.0 g/dL 12.1 11.7(L) 12.1  HCT 36 - 46 % 36.6 35.7(L) 36.4  PLT 150 - 400 K/uL 222 186 193  NEUTROABS 1.7 - 7.7 K/uL 3.7 3.9 4.5  LYMPHSABS 0.7 - 4.0 K/uL 1.4 1.5 1.7     There is no height or weight on file to calculate BMI.  Orders:  No orders of the defined types were placed in this encounter.  No orders of the defined types were placed in this encounter.    Procedures: No procedures performed  Clinical Data: No additional findings.  ROS:  All other systems negative, except as noted in the HPI. Review of Systems  Objective: Vital Signs: There were no vitals taken for this visit.  Specialty Comments:  No specialty comments available.  PMFS History: Patient Active Problem List   Diagnosis Date Noted  . Lip lesion 03/08/2020  . Right foot pain 01/04/2020  . Obesity, Class II, BMI 35-39.9 09/10/2019  . Abdominal pain 08/31/2019  . Dermatitis 12/15/2018  . Mucositis 12/15/2018  . Chronic constipation 01/26/2018  . Iron deficiency anemia 11/13/2017  . Encounter for antineoplastic chemotherapy 09/01/2017  . Chronic nausea 08/29/2017  . Metastasis to liver ([place]) 08/29/2017  . Hot flashes 06/12/2017  .  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 Goals of care, counseling/discussion 05/28/2017  . 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 Peripheral neuropathy due to chemotherapy ([place]) 09/30/2016  . Anemia due to antineoplastic chemotherapy 09/30/2016  . Restless leg syndrome 09/19/2016  . Deficiency anemia 09/19/2016  . Superficial thrombophlebitis of cephalic vein 61/60/7371  . Pancytopenia, acquired ([facility]) 08/15/2016  . Thoracic aorta atherosclerosis ([place]) 05/29/2016  . Acquired hypothyroidism 05/29/2016  . Essential hypertension 05/29/2016  . Recurrent carcinoma of endometrium ([place]) 01/02/2016   Past Medical History:  Diagnosis Date  . Broken ribs    hx of  . Family history of adverse reaction to  anesthesia    mother post op N&V  . History of radiation therapy 06/24/16-08/01/16 and 08/13/16, 08/20/16, 08/27/16, 09/04/16   pelvis treated to 55 Gy in 25 fractions, vaginal cuff treated to 24 Gy in 4 fractions  . Hypertension   . Hypothyroidism   . Liver metastases ([place]) dx'd 2018  . Uterine cancer [facility])    May 30 , 2017    Family History  Problem Relation Age of Onset  . Anesthesia problems Mother   . Uterine cancer Mother 49  . Bladder Cancer Father 48  . Diabetes Sister   . Rectal cancer Cousin 69       d.69    Past Surgical History:  Procedure Laterality Date  . achellis tendon repaired in 2005    . ANKLE SURGERY Right Oct 2006  . fisure  2014  . IR FLUORO GUIDE PORT INSERTION RIGHT  09/10/2017  . IR US GUIDE VASC ACCESS RIGHT  09/10/2017  . LYMPH NODE BIOPSY N/A 01/02/2016   Procedure: SENTINAL LYMPH NODE BIOPSY;  Surgeon: Everitt Amber, MD;  Location: [facility];  Service: Gynecology;  Laterality: N/A;  . rectal fissure surgery 2010    . REPLACEMENT TOTAL KNEE Left 2015  . ROBOTIC ASSISTED TOTAL HYSTERECTOMY WITH BILATERAL SALPINGO OOPHERECTOMY Bilateral 01/02/2016   Procedure: XI ROBOTIC ASSISTED TOTAL LAPAROSCOPIC HYSTERECTOMY WITH BILATERAL SALPINGO OOPHORECTOMY;  Surgeon: Everitt Amber, MD;  Location: [facility];  Service: Gynecology;  Laterality: Bilateral;  . TUBAL LIGATION     Social History   Occupational History  . Occupation: retired Sales executive  Tobacco Use  . Smoking status: Never Smoker  . Smokeless tobacco: Never Used  Substance and Sexual Activity  . Alcohol use: Yes    Comment: socially  . Drug use: No  . Sexual activity: Never

## 2020-03-29 ENCOUNTER — Other Ambulatory Visit: Payer: Self-pay | Admitting: Hematology and Oncology

## 2020-03-29 ENCOUNTER — Inpatient Hospital Stay: Payer: Medicare Other

## 2020-03-29 ENCOUNTER — Other Ambulatory Visit: Payer: Self-pay

## 2020-03-29 VITALS — BP 142/75 | HR 71 | Temp 98.5°F | Resp 18 | Ht 71.0 in | Wt 216.5 lb

## 2020-03-29 DIAGNOSIS — C541 Malignant neoplasm of endometrium: Secondary | ICD-10-CM

## 2020-03-29 DIAGNOSIS — Z5112 Encounter for antineoplastic immunotherapy: Secondary | ICD-10-CM | POA: Diagnosis not present

## 2020-03-29 DIAGNOSIS — C787 Secondary malignant neoplasm of liver and intrahepatic bile duct: Secondary | ICD-10-CM

## 2020-03-29 LAB — CBC WITH DIFFERENTIAL/PLATELET
Abs Immature Granulocytes: 0.02 10*3/uL (ref 0.00–0.07)
Basophils Absolute: 0.1 10*3/uL (ref 0.0–0.1)
Basophils Relative: 1 %
Eosinophils Absolute: 2.7 10*3/uL — ABNORMAL HIGH (ref 0.0–0.5)
Eosinophils Relative: 30 %
HCT: 35.7 % — ABNORMAL LOW (ref 36.0–46.0)
Hemoglobin: 11.7 g/dL — ABNORMAL LOW (ref 12.0–15.0)
Immature Granulocytes: 0 %
Lymphocytes Relative: 18 %
Lymphs Abs: 1.6 10*3/uL (ref 0.7–4.0)
MCH: 27.5 pg (ref 26.0–34.0)
MCHC: 32.8 g/dL (ref 30.0–36.0)
MCV: 84 fL (ref 80.0–100.0)
Monocytes Absolute: 0.5 10*3/uL (ref 0.1–1.0)
Monocytes Relative: 5 %
Neutro Abs: 4.3 10*3/uL (ref 1.7–7.7)
Neutrophils Relative %: 46 %
Platelets: 200 10*3/uL (ref 150–400)
RBC: 4.25 MIL/uL (ref 3.87–5.11)
RDW: 14.2 % (ref 11.5–15.5)
WBC: 9.2 10*3/uL (ref 4.0–10.5)
nRBC: 0 % (ref 0.0–0.2)

## 2020-03-29 LAB — COMPREHENSIVE METABOLIC PANEL
ALT: 15 U/L (ref 0–44)
AST: 18 U/L (ref 15–41)
Albumin: 3.2 g/dL — ABNORMAL LOW (ref 3.5–5.0)
Alkaline Phosphatase: 118 U/L (ref 38–126)
Anion gap: 6 (ref 5–15)
BUN: 21 mg/dL (ref 8–23)
CO2: 27 mmol/L (ref 22–32)
Calcium: 9 mg/dL (ref 8.9–10.3)
Chloride: 103 mmol/L (ref 98–111)
Creatinine, Ser: 0.77 mg/dL (ref 0.44–1.00)
GFR calc Af Amer: >60 mL/min (ref >60)
GFR calc non Af Amer: >60 mL/min (ref >60)
Glucose, Bld: 100 mg/dL — ABNORMAL HIGH (ref 70–99)
Potassium: 3.8 mmol/L (ref 3.5–5.1)
Sodium: 136 mmol/L (ref 135–145)
Total Bilirubin: 0.4 mg/dL (ref 0.3–1.2)
Total Protein: 6.4 g/dL — ABNORMAL LOW (ref 6.5–8.1)

## 2020-03-29 MED ORDER — SODIUM CHLORIDE 0.9 % IV SOLN
200.0000 mg | Freq: Once | INTRAVENOUS | Status: AC
  Administered 2020-03-29: 200 mg via INTRAVENOUS
  Filled 2020-03-29: qty 8

## 2020-03-29 MED ORDER — SODIUM CHLORIDE 0.9% FLUSH
10.0000 mL | Freq: Once | INTRAVENOUS | Status: AC
  Administered 2020-03-29: 10 mL
  Filled 2020-03-29: qty 10

## 2020-03-29 MED ORDER — SODIUM CHLORIDE 0.9 % IV SOLN
Freq: Once | INTRAVENOUS | Status: AC
  Filled 2020-03-29: qty 250

## 2020-03-29 MED ORDER — SODIUM CHLORIDE 0.9% FLUSH
10.0000 mL | INTRAVENOUS | Status: DC | PRN
  Administered 2020-03-29: 10 mL
  Filled 2020-03-29: qty 10

## 2020-03-29 MED ORDER — HEPARIN SOD (PORK) LOCK FLUSH 100 UNIT/ML IV SOLN
500.0000 [IU] | Freq: Once | INTRAVENOUS | Status: AC | PRN
  Administered 2020-03-29: 500 [IU]
  Filled 2020-03-29: qty 5

## 2020-03-29 NOTE — Patient Instructions (Signed)

## 2020-03-29 NOTE — Patient Instructions (Signed)
[facility] Discharge Instructions for Patients Receiving Chemotherapy  Today you received the following monoclonal antibody agents Pembrolizumab (KEYRUDA).  To help prevent nausea and vomiting after your treatment, we encourage you to take your nausea medication as prescribed.   If you develop nausea and vomiting that is not controlled by your nausea medication, call the clinic.   BELOW ARE SYMPTOMS THAT SHOULD BE REPORTED IMMEDIATELY:  *FEVER GREATER THAN 100.5 F  *CHILLS WITH OR WITHOUT FEVER  NAUSEA AND VOMITING THAT IS NOT CONTROLLED WITH YOUR NAUSEA MEDICATION  *UNUSUAL SHORTNESS OF BREATH  *UNUSUAL BRUISING OR BLEEDING  TENDERNESS IN MOUTH AND THROAT WITH OR WITHOUT PRESENCE OF ULCERS  *URINARY PROBLEMS  *BOWEL PROBLEMS  UNUSUAL RASH Items with * indicate a potential emergency and should be followed up as soon as possible.  Feel free to call the clinic should you have any questions or concerns. The clinic phone number is [phone].  Please show the [facility] at check-in to the Emergency Department and triage nurse.

## 2020-04-18 ENCOUNTER — Other Ambulatory Visit: Payer: Self-pay | Admitting: Hematology and Oncology

## 2020-04-18 DIAGNOSIS — C541 Malignant neoplasm of endometrium: Secondary | ICD-10-CM

## 2020-04-18 DIAGNOSIS — C787 Secondary malignant neoplasm of liver and intrahepatic bile duct: Secondary | ICD-10-CM

## 2020-04-19 ENCOUNTER — Encounter: Payer: Self-pay | Admitting: Hematology and Oncology

## 2020-04-19 ENCOUNTER — Inpatient Hospital Stay: Payer: Medicare Other

## 2020-04-19 ENCOUNTER — Other Ambulatory Visit: Payer: Self-pay

## 2020-04-19 ENCOUNTER — Inpatient Hospital Stay (HOSPITAL_BASED_OUTPATIENT_CLINIC_OR_DEPARTMENT_OTHER): Payer: Medicare Other | Admitting: Hematology and Oncology

## 2020-04-19 ENCOUNTER — Inpatient Hospital Stay: Payer: Medicare Other | Attending: Hematology and Oncology

## 2020-04-19 DIAGNOSIS — C787 Secondary malignant neoplasm of liver and intrahepatic bile duct: Secondary | ICD-10-CM

## 2020-04-19 DIAGNOSIS — C541 Malignant neoplasm of endometrium: Secondary | ICD-10-CM

## 2020-04-19 DIAGNOSIS — Z791 Long term (current) use of non-steroidal anti-inflammatories (NSAID): Secondary | ICD-10-CM | POA: Diagnosis not present

## 2020-04-19 DIAGNOSIS — Z923 Personal history of irradiation: Secondary | ICD-10-CM | POA: Insufficient documentation

## 2020-04-19 DIAGNOSIS — K130 Diseases of lips: Secondary | ICD-10-CM | POA: Diagnosis not present

## 2020-04-19 DIAGNOSIS — Z79899 Other long term (current) drug therapy: Secondary | ICD-10-CM | POA: Diagnosis not present

## 2020-04-19 DIAGNOSIS — E039 Hypothyroidism, unspecified: Secondary | ICD-10-CM | POA: Insufficient documentation

## 2020-04-19 DIAGNOSIS — Z9221 Personal history of antineoplastic chemotherapy: Secondary | ICD-10-CM | POA: Diagnosis not present

## 2020-04-19 DIAGNOSIS — Z5112 Encounter for antineoplastic immunotherapy: Secondary | ICD-10-CM | POA: Insufficient documentation

## 2020-04-19 DIAGNOSIS — Z299 Encounter for prophylactic measures, unspecified: Secondary | ICD-10-CM | POA: Insufficient documentation

## 2020-04-19 DIAGNOSIS — Z7952 Long term (current) use of systemic steroids: Secondary | ICD-10-CM | POA: Diagnosis not present

## 2020-04-19 LAB — COMPREHENSIVE METABOLIC PANEL
ALT: 11 U/L (ref 0–44)
AST: 16 U/L (ref 15–41)
Albumin: 3.3 g/dL — ABNORMAL LOW (ref 3.5–5.0)
Alkaline Phosphatase: 111 U/L (ref 38–126)
Anion gap: 3 — ABNORMAL LOW (ref 5–15)
BUN: 20 mg/dL (ref 8–23)
CO2: 32 mmol/L (ref 22–32)
Calcium: 9.5 mg/dL (ref 8.9–10.3)
Chloride: 106 mmol/L (ref 98–111)
Creatinine, Ser: 0.81 mg/dL (ref 0.44–1.00)
GFR, Estimated: >60 mL/min (ref >60)
Glucose, Bld: 85 mg/dL (ref 70–99)
Potassium: 4.3 mmol/L (ref 3.5–5.1)
Sodium: 141 mmol/L (ref 135–145)
Total Bilirubin: 0.5 mg/dL (ref 0.3–1.2)
Total Protein: 6.5 g/dL (ref 6.5–8.1)

## 2020-04-19 LAB — CBC WITH DIFFERENTIAL/PLATELET
Abs Immature Granulocytes: 0.03 10*3/uL (ref 0.00–0.07)
Basophils Absolute: 0.1 10*3/uL (ref 0.0–0.1)
Basophils Relative: 1 %
Eosinophils Absolute: 1.5 10*3/uL — ABNORMAL HIGH (ref 0.0–0.5)
Eosinophils Relative: 20 %
HCT: 36.1 % (ref 36.0–46.0)
Hemoglobin: 12 g/dL (ref 12.0–15.0)
Immature Granulocytes: 0 %
Lymphocytes Relative: 20 %
Lymphs Abs: 1.5 10*3/uL (ref 0.7–4.0)
MCH: 28 pg (ref 26.0–34.0)
MCHC: 33.2 g/dL (ref 30.0–36.0)
MCV: 84.1 fL (ref 80.0–100.0)
Monocytes Absolute: 0.5 10*3/uL (ref 0.1–1.0)
Monocytes Relative: 7 %
Neutro Abs: 4.1 10*3/uL (ref 1.7–7.7)
Neutrophils Relative %: 52 %
Platelets: 213 10*3/uL (ref 150–400)
RBC: 4.29 MIL/uL (ref 3.87–5.11)
RDW: 13.5 % (ref 11.5–15.5)
WBC: 7.8 10*3/uL (ref 4.0–10.5)
nRBC: 0 % (ref 0.0–0.2)

## 2020-04-19 LAB — TSH: TSH: 8.688 u[IU]/mL — ABNORMAL HIGH (ref 0.308–3.960)

## 2020-04-19 MED ORDER — SODIUM CHLORIDE 0.9 % IV SOLN
Freq: Once | INTRAVENOUS | Status: AC
  Filled 2020-04-19: qty 250

## 2020-04-19 MED ORDER — ALTEPLASE 2 MG IJ SOLR
INTRAMUSCULAR | Status: AC
  Filled 2020-04-19: qty 2

## 2020-04-19 MED ORDER — ALTEPLASE 2 MG IJ SOLR
2.0000 mg | Freq: Once | INTRAMUSCULAR | Status: AC
  Administered 2020-04-19: 2 mg
  Filled 2020-04-19: qty 2

## 2020-04-19 MED ORDER — SODIUM CHLORIDE 0.9% FLUSH
10.0000 mL | Freq: Once | INTRAVENOUS | Status: DC
  Filled 2020-04-19: qty 10

## 2020-04-19 MED ORDER — SODIUM CHLORIDE 0.9% FLUSH
10.0000 mL | INTRAVENOUS | Status: DC | PRN
  Administered 2020-04-19: 10 mL
  Filled 2020-04-19: qty 10

## 2020-04-19 MED ORDER — SODIUM CHLORIDE 0.9 % IV SOLN
200.0000 mg | Freq: Once | INTRAVENOUS | Status: AC
  Administered 2020-04-19: 200 mg via INTRAVENOUS
  Filled 2020-04-19: qty 8

## 2020-04-19 MED ORDER — HEPARIN SOD (PORK) LOCK FLUSH 100 UNIT/ML IV SOLN
500.0000 [IU] | Freq: Once | INTRAVENOUS | Status: AC | PRN
  Administered 2020-04-19: 500 [IU]
  Filled 2020-04-19: qty 5

## 2020-04-19 NOTE — Assessment & Plan Note (Signed)
According to the patient, recent biopsy was benign She is reassured

## 2020-04-19 NOTE — Progress Notes (Signed)
[facility] OFFICE PROGRESS NOTE  Patient Care Team: [name], MD as PCP - General (Family Medicine)  ASSESSMENT & PLAN:  Recurrent carcinoma of endometrium [facility]) I have reviewed multiple imaging studies She has no signs of cancer progression and she tolerated treatment very well We will continue pembrolizumab indefinitely I will see her every other treatment The next imaging study would be around March 2022  Lip lesion According to the patient, recent biopsy was benign She is reassured  Acquired hypothyroidism Her recent TSH is intermittently elevated She will continue chronic thyroid replacement therapy We will adjust her thyroid medicine as needed  Preventive measure We discussed the importance of preventive care and reviewed the vaccination programs. She does not have any prior allergic reactions to influenza vaccination. She agrees to proceed with influenza vaccination today and we will administer it today at the clinic. We also discussed screening colonoscopy and mammogram   No orders of the defined types were placed in this encounter.   All questions were answered. The patient knows to call the clinic with any problems, questions or concerns. The total time spent in the appointment was 20 minutes encounter with patients including review of chart and various tests results, discussions about plan of care and coordination of care plan   Heath Lark, MD 04/19/2020 1:01 PM  INTERVAL HISTORY: Please see below for problem oriented charting. She returns for further follow-up She had recent biopsy of her lip that came back benign She tolerated treatment well No recent infusion reaction  SUMMARY OF ONCOLOGIC HISTORY: Oncology History Overview Note  MSI stable on June 2017 tissue but high from [facility] study from November 2017  05/15/16: ER is moderately positive (70%). PR is strongly positive (80%).   Recurrent carcinoma of endometrium ([place])   01/02/2016 Pathology Results   Uterus +/- tubes/ovaries, neoplastic, cervix ENDOMETRIAL ADENOCARCINOMA, FIGO GRADE 2 (4.7 CM) THE TUMOR INVADES LESS THAN ONE-HALF OF THE MYOMETRIUM (PT1A) ALL MARGINS OF RESECTION ARE NEGATIVE FOR CARCINOMA LEIOMYOMAS AND ADENOMYOSIS BILATERAL FALLOPIAN TUBES AND OVARIES: HISTOLOGICAL UNREMARKABLE 2. Lymph node, sentinel, biopsy, right obturator ONE BENIGN LYMPH NODE (0/1) 3. Lymph nodes, regional resection, left pelvic FOUR BENIGN LYMPH NODES (0/4)   01/02/2016 Surgery   Dr. Denman George performed robotic-assisted laparoscopic total hysterectomy with bilateral salpingoophorectomy, sentinel lymph node biopsy, lymphadenectomy     05/15/2016 Pathology Results   Vagina, biopsy, mid - ADENOCARCINOMA, SEE COMMENT. Microscopic Comment The morphology along with the patient's history are consistent with recurrent endometrioid adenocarcinoma. The carcinoma has a similar appearance to the primary ([id number]).   05/20/2016 Imaging   Ct scan abdomen showed solid 2.5 cm peritoneal mass in the mid to anterior left pelvis, suspicious for peritoneal metastasis. 2. Small expansile low-attenuation filling defect in the left external iliac vein, cannot exclude a small deep venous thrombus. Consider correlation with left lower extremity venous Doppler scan. 3. Small simple fluid density structure in the left pelvic sidewall abutting the left external iliac vessels, favor a small postoperative seroma. 4. No ascites. 5. No lymphadenopathy.  No metastatic disease in the chest. 6. Aortic atherosclerosis.   06/12/2016 PET scan   Intensely hypermetabolic 2.1 cm central pelvic peritoneal mass just to the left of midline, consistent with peritoneal metastatic recurrence. No ascites. 2. No additional hypermetabolic sites of metastatic disease. 3. Diffuse thyroid hypermetabolism without discrete thyroid nodule, favoring thyroiditis. Recommend correlation with serum thyroid function tests.    06/24/2016 - 07/22/2016 Chemotherapy   The patient had weekly cisplatin.  She has missed several doses due to infection and pancytopenia   06/24/2016 - 09/04/2016 Radiation Therapy   She completed concurrent radiation therapy Radiation treatment dates:   IMRT : 06/24/16 - 08/01/16 HDR : 08/13/16, 08/20/16, 08/27/16, 09/04/16  Site/dose:   Pelvis treated to 55 Gy in 25 fractions (simultaneous integrated boost technique) Vaginal Cuff treated to 24 Gy in 4 fractions   08/15/2016 - 12/03/2016 Chemotherapy   She received 6 cycles of carboplatin/Taxol   09/27/2016 Imaging   Interval decrease in size of previously described solid peritoneal nodule within the left anterior pelvis. Near complete resolution of previously described low-attenuation structure along the left pelvic sidewall. No evidence for metastatic disease in the chest. Aortic atherosclerosis.   12/30/2016 Imaging   Ct abdomen 1. Solitary left pelvic peritoneal implant is mildly decreased in size in the interval. 2. No new or progressive metastatic disease in the abdomen or pelvis. No ascites. 3. Aortic atherosclerosis.   04/02/2017 Imaging   Left lower quadrant peritoneal implant referenced on previous exam measures 1.7 x 1.3 cm, image 69 of series 2. Increased from 0.8 x 0.8 cm previously peer no new peritoneal implants identified.  Musculoskeletal: The degenerative disc disease noted within the lumbar spine.  IMPRESSION: 1. Solitary left pelvic peritoneal implant is increased in size in the interval. 2. No new sites of disease.  No ascites. 3. Aortic atherosclerosis   04/11/2017 PET scan   1. The left side of pelvis peritoneal implant has decreased in size and degree of FDG uptake compatible with response to therapy. No new areas of peritoneal disease identified. 2. Persistent diffuse increased uptake within the thyroid gland. Correlation with patient's thyroid function may be helpful.   05/26/2017 Imaging   1. Enlarging tumor  implant along the left adnexa, currently 2.6 by 2.4 cm and previously 1.9 by 1.3 cm. No new tumor implant or other specific cause for the patient's pelvic symptoms is currently identified. 2.  Aortic Atherosclerosis (ICD10-I70.0). 3. Lumbar spondylosis and degenerative disc disease causing multilevel impingement.   06/04/2017 - 08/28/2017 Chemotherapy   The patient started letrozole and everolimus   08/26/2017 Imaging   Increased size of mass in the left adnexal region.  Several new small liver metastases in right hepatic lobe   09/08/2017 Imaging   LV EF: 60% -  65%   09/10/2017 Procedure   Technically successful right IJ power-injectable port catheter placement. Ready for routine use   12/04/2017 Imaging   1. Interval increase in size and number of multiple lesions within the liver compatible with hepatic metastatic disease. 2. Interval increase in size mass within the left hemipelvis.   12/15/2017 -  Chemotherapy   The patient had pembrolizumab (KEYTRUDA) 200 mg in sodium chloride 0.9 % 50 mL chemo infusion, 200 mg, Intravenous, Once, 1 of 6 cycles Administration: 200 mg (12/15/2017)  for chemotherapy treatment.    12/21/2017 Imaging   1. Moderate left hydronephrosis and hydroureter, similar compared to most recent CT from May 2019; obstruction appears to be secondary to a left pelvic mass/metastatic focus which has increased in size since the prior CT. 2. Numerous hepatic metastatic lesions, suspect that some may be increased in size but difficult to further characterize without intravenous contrast. Increased size of left pelvic soft tissue mass/metastatic lesion. Mass abuts and possibly invades the sigmoid colon.    02/20/2018 Imaging   Interval decrease in hepatic metastases.  Decreased mass or lymphadenopathy in the left external iliac chain. Interval resolution of left hydroureteronephrosis.  No new or progressive disease within the abdomen or pelvis.   05/16/2018 Imaging    05/16/2018 CT Abdomen IMPRESSION: 1. Slight interval decrease in size of hepatic metastatic disease. 2. Slight interval decrease in size centrally necrotic mass within the left external iliac region.   09/18/2018 Imaging   1. Today's study demonstrates a mixed response to therapy. Specifically, while the previously noted hepatic metastases appear decreased, the soft tissue mass along the left pelvic sidewall appears slightly larger than the prior study. No new metastatic disease is noted elsewhere in the abdomen or pelvis. 2. Aortic atherosclerosis. 3. Mild cardiomegaly. 4. Additional incidental findings, as above    03/12/2019 Imaging   1. Overall improvement with reduced size of the hepatic metastatic lesions, and reduced size of the irregular soft tissue mass in the left pelvis. 2. Other imaging findings of potential clinical significance: Mild distal esophageal wall thickening, cannot exclude esophagitis. Aortic Atherosclerosis (ICD10-I70.0). Lumbar impingement at L5-S1.     09/09/2019 Imaging   1. Stable size and appearance of the somewhat irregular left adnexal mass, currently 2.8 by 1.9 cm. 2. The scattered hepatic metastatic lesions are stable from 03/12/2019, and represent effectively treated metastatic lesions. 3. Other imaging findings of potential clinical significance: Airway thickening is present, suggesting bronchitis or reactive airways disease. Lumbar spondylosis and degenerative disc disease causing impingement at L4-5 and L5-S1. Small umbilical hernia contains adipose tissue.   Aortic Atherosclerosis (ICD10-I70.0).   03/07/2020 Imaging   1. Stable appearance of LEFT pelvic mass without new signs of disease. Mass remains closely approximated to the colon, in the vicinity of the distal LEFT ureter and the iliac vessels. 2. Stable signs of hepatic metastatic disease with calcified lesion at the dome of the liver and subtle areas of low attenuation elsewhere which show no  change.     REVIEW OF SYSTEMS:   Constitutional: Denies fevers, chills or abnormal weight loss Eyes: Denies blurriness of vision Ears, nose, mouth, throat, and face: Denies mucositis or sore throat Respiratory: Denies cough, dyspnea or wheezes Cardiovascular: Denies palpitation, chest discomfort or lower extremity swelling Gastrointestinal:  Denies nausea, heartburn or change in bowel habits Skin: Denies abnormal skin rashes Lymphatics: Denies new lymphadenopathy or easy bruising Neurological:Denies numbness, tingling or new weaknesses Behavioral/Psych: Mood is stable, no new changes  All other systems were reviewed with the patient and are negative.  I have reviewed the past medical history, past surgical history, social history and family history with the patient and they are unchanged from previous note.  ALLERGIES:  is allergic to latex and lisinopril.  MEDICATIONS:  Current Outpatient Medications  Medication Sig Dispense Refill  . acetaminophen (TYLENOL) 325 MG tablet Take 650 mg by mouth every 6 (six) hours as needed for mild pain or moderate pain.     Marland Kitchen amoxicillin (AMOXIL) 500 MG capsule Take 2,000 mg by mouth See admin instructions. Prior to dental appointment, takes 4 tables prior to appt    . Biotin w/ Vitamins C & E (HAIR/SKIN/NAILS PO) Take 1 tablet by mouth daily.    . Calcium Carb-Cholecalciferol (CALCIUM 600 + D PO) Take 2 tablets by mouth daily.    . chlorhexidine (PERIDEX) 0.12 % solution 15 mLs 2 (two) times daily.    . fluticasone (FLONASE) 50 MCG/ACT nasal spray Place 1 spray into both nostrils 2 (two) times daily as needed for allergies.   1  . gabapentin (NEURONTIN) 600 MG tablet TAKE 1 TABLET BY MOUTH TWICE A DAY FOR RESTLESS LEGS AND  NEUROPATHY. 60 tablet 11  . HYDROcodone-acetaminophen (NORCO) 10-325 MG tablet Take 1 tablet by mouth every 6 (six) hours as needed. 30 tablet 0  . ibuprofen (ADVIL,MOTRIN) 200 MG tablet Take 400 mg by mouth every 8 (eight) hours  as needed for mild pain.    Marland Kitchen levothyroxine (SYNTHROID) 200 MCG tablet Take 1 tablet (200 mcg total) by mouth daily. 30 tablet 3  . lidocaine-prilocaine (EMLA) cream APPLY TO AFFECTED AREA ONCE 30 g 3  . loratadine (CLARITIN) 10 MG tablet Take 10 mg by mouth daily.    Marland Kitchen LORazepam (ATIVAN) 1 MG tablet TAKE 1 TABLET (1 MG TOTAL) BY MOUTH EVERY 8 (EIGHT) HOURS AS NEEDED FOR ANXIETY OR SLEEP. 90 tablet 0  . losartan-hydrochlorothiazide (HYZAAR) 100-12.5 MG tablet Take 0.5 tablets by mouth daily. 60 tablet 9  . magnesium oxide (MAG-OX) 400 MG tablet Take 1 tablet by mouth 2 (two) times daily.    . Melatonin 10 MG CAPS Take 10 mg by mouth at bedtime.    . meloxicam (MOBIC) 7.5 MG tablet May take 1 to 2 tablets with food a day as needed for pain.    . methylPREDNISolone (MEDROL DOSEPAK) 4 MG TBPK tablet TAKE 6 TABLETS ON DAY 1 AS DIRECTED ON PACKAGE AND DECREASE BY 1 TAB EACH DAY FOR A TOTAL OF 6 DAYS    . minocycline (MINOCIN) 100 MG capsule     . niacinamide 500 MG tablet Take 500 mg by mouth 3 (three) times daily with meals.    . ondansetron (ZOFRAN) 8 MG tablet Take 1 tablet (8 mg total) by mouth every 8 (eight) hours as needed for nausea. 60 tablet 1  . potassium gluconate 595 (99 K) MG TABS tablet Take by mouth.    . pramipexole (MIRAPEX) 0.5 MG tablet Take 1 mg by mouth at bedtime.    . traZODone (DESYREL) 50 MG tablet Take 1.5 tablets (75 mg total) by mouth at bedtime. (Patient taking differently: Take 50 mg by mouth at bedtime. ) 135 tablet 1   No current facility-administered medications for this visit.    PHYSICAL EXAMINATION: ECOG PERFORMANCE STATUS: 1 - Symptomatic but completely ambulatory  Vitals:   04/19/20 1205  BP: (!) 141/93  Pulse: 80  Resp: 18  Temp: 97.8 F (36.6 C)  SpO2: 100%   Filed Weights   04/19/20 1205  Weight: 213 lb 3.2 oz (96.7 kg)    GENERAL:alert, no distress and comfortable SKIN: skin color, texture, turgor are normal, no rashes or significant  lesions EYES: normal, Conjunctiva are pink and non-injected, sclera clear OROPHARYNX:no exudate, no erythema and lips, buccal mucosa, and tongue normal.  The lip lesion is improved NECK: supple, thyroid normal size, non-tender, without nodularity LYMPH:  no palpable lymphadenopathy in the cervical, axillary or inguinal LUNGS: clear to auscultation and percussion with normal breathing effort HEART: regular rate & rhythm and no murmurs and no lower extremity edema ABDOMEN:abdomen soft, non-tender and normal bowel sounds Musculoskeletal:no cyanosis of digits and no clubbing  NEURO: alert & oriented x 3 with fluent speech, no focal motor/sensory deficits  LABORATORY DATA:  I have reviewed the data as listed    Component Value Date/Time   NA 141 04/19/2020 1143   NA 141 07/09/2017 1312   K 4.3 04/19/2020 1143   K 3.9 07/09/2017 1312   CL 106 04/19/2020 1143   CO2 32 04/19/2020 1143   CO2 27 07/09/2017 1312   GLUCOSE 85 04/19/2020 1143   GLUCOSE 109 07/09/2017 1312  BUN 20 04/19/2020 1143   BUN 16.4 07/09/2017 1312   CREATININE 0.81 04/19/2020 1143   CREATININE 0.91 01/04/2019 1351   CREATININE 0.8 07/09/2017 1312   CALCIUM 9.5 04/19/2020 1143   CALCIUM 9.2 07/09/2017 1312   PROT 6.5 04/19/2020 1143   PROT 7.1 07/09/2017 1312   ALBUMIN 3.3 (L) 04/19/2020 1143   ALBUMIN 3.1 (L) 07/09/2017 1312   AST 16 04/19/2020 1143   AST 19 01/04/2019 1351   AST 35 (H) 07/09/2017 1312   ALT 11 04/19/2020 1143   ALT 20 01/04/2019 1351   ALT 43 07/09/2017 1312   ALKPHOS 111 04/19/2020 1143   ALKPHOS 182 (H) 07/09/2017 1312   BILITOT 0.5 04/19/2020 1143   BILITOT 0.3 01/04/2019 1351   BILITOT 0.34 07/09/2017 1312   GFRNONAA >60 04/19/2020 1143   GFRNONAA >60 01/04/2019 1351   GFRAA >60 03/29/2020 1105   GFRAA >60 01/04/2019 1351    No results found for: SPEP, UPEP  Lab Results  Component Value Date   WBC 7.8 04/19/2020   NEUTROABS 4.1 04/19/2020   HGB 12.0 04/19/2020   HCT 36.1  04/19/2020   MCV 84.1 04/19/2020   PLT 213 04/19/2020      Chemistry      Component Value Date/Time   NA 141 04/19/2020 1143   NA 141 07/09/2017 1312   K 4.3 04/19/2020 1143   K 3.9 07/09/2017 1312   CL 106 04/19/2020 1143   CO2 32 04/19/2020 1143   CO2 27 07/09/2017 1312   BUN 20 04/19/2020 1143   BUN 16.4 07/09/2017 1312   CREATININE 0.81 04/19/2020 1143   CREATININE 0.91 01/04/2019 1351   CREATININE 0.8 07/09/2017 1312      Component Value Date/Time   CALCIUM 9.5 04/19/2020 1143   CALCIUM 9.2 07/09/2017 1312   ALKPHOS 111 04/19/2020 1143   ALKPHOS 182 (H) 07/09/2017 1312   AST 16 04/19/2020 1143   AST 19 01/04/2019 1351   AST 35 (H) 07/09/2017 1312   ALT 11 04/19/2020 1143   ALT 20 01/04/2019 1351   ALT 43 07/09/2017 1312   BILITOT 0.5 04/19/2020 1143   BILITOT 0.3 01/04/2019 1351   BILITOT 0.34 07/09/2017 1312

## 2020-04-19 NOTE — Assessment & Plan Note (Signed)
Her recent TSH is intermittently elevated She will continue chronic thyroid replacement therapy We will adjust her thyroid medicine as needed 

## 2020-04-19 NOTE — Assessment & Plan Note (Signed)
I have reviewed multiple imaging studies She has no signs of cancer progression and she tolerated treatment very well We will continue pembrolizumab indefinitely I will see her every other treatment The next imaging study would be around March 2022

## 2020-04-19 NOTE — Assessment & Plan Note (Signed)
We discussed the importance of preventive care and reviewed the vaccination programs. She does not have any prior allergic reactions to influenza vaccination. She agrees to proceed with influenza vaccination today and we will administer it today at the clinic. We also discussed screening colonoscopy and mammogram

## 2020-04-19 NOTE — Progress Notes (Signed)
No blood return from port.. cathflow in place. Dr. Calton Dach nurse aware. Pt sent to lab for blood draw.

## 2020-05-10 ENCOUNTER — Inpatient Hospital Stay: Payer: Medicare Other | Attending: Hematology and Oncology

## 2020-05-10 ENCOUNTER — Inpatient Hospital Stay: Payer: Medicare Other

## 2020-05-10 ENCOUNTER — Other Ambulatory Visit: Payer: Self-pay

## 2020-05-10 VITALS — BP 144/74 | HR 70 | Temp 98.2°F | Resp 18 | Wt 212.0 lb

## 2020-05-10 DIAGNOSIS — Z90722 Acquired absence of ovaries, bilateral: Secondary | ICD-10-CM | POA: Insufficient documentation

## 2020-05-10 DIAGNOSIS — Z923 Personal history of irradiation: Secondary | ICD-10-CM | POA: Diagnosis not present

## 2020-05-10 DIAGNOSIS — Z79899 Other long term (current) drug therapy: Secondary | ICD-10-CM | POA: Diagnosis not present

## 2020-05-10 DIAGNOSIS — Z90710 Acquired absence of both cervix and uterus: Secondary | ICD-10-CM | POA: Insufficient documentation

## 2020-05-10 DIAGNOSIS — E039 Hypothyroidism, unspecified: Secondary | ICD-10-CM | POA: Diagnosis not present

## 2020-05-10 DIAGNOSIS — C541 Malignant neoplasm of endometrium: Secondary | ICD-10-CM | POA: Diagnosis present

## 2020-05-10 DIAGNOSIS — Z23 Encounter for immunization: Secondary | ICD-10-CM | POA: Diagnosis not present

## 2020-05-10 DIAGNOSIS — Z9221 Personal history of antineoplastic chemotherapy: Secondary | ICD-10-CM | POA: Diagnosis not present

## 2020-05-10 DIAGNOSIS — C787 Secondary malignant neoplasm of liver and intrahepatic bile duct: Secondary | ICD-10-CM | POA: Diagnosis present

## 2020-05-10 DIAGNOSIS — Z5112 Encounter for antineoplastic immunotherapy: Secondary | ICD-10-CM | POA: Diagnosis present

## 2020-05-10 DIAGNOSIS — G620 Drug-induced polyneuropathy: Secondary | ICD-10-CM | POA: Diagnosis not present

## 2020-05-10 DIAGNOSIS — Z9079 Acquired absence of other genital organ(s): Secondary | ICD-10-CM | POA: Diagnosis not present

## 2020-05-10 LAB — COMPREHENSIVE METABOLIC PANEL
ALT: 14 U/L (ref 0–44)
AST: 17 U/L (ref 15–41)
Albumin: 3.4 g/dL — ABNORMAL LOW (ref 3.5–5.0)
Alkaline Phosphatase: 111 U/L (ref 38–126)
Anion gap: 7 (ref 5–15)
BUN: 18 mg/dL (ref 8–23)
CO2: 28 mmol/L (ref 22–32)
Calcium: 9.3 mg/dL (ref 8.9–10.3)
Chloride: 105 mmol/L (ref 98–111)
Creatinine, Ser: 0.76 mg/dL (ref 0.44–1.00)
GFR, Estimated: >60 mL/min (ref >60)
Glucose, Bld: 87 mg/dL (ref 70–99)
Potassium: 3.8 mmol/L (ref 3.5–5.1)
Sodium: 140 mmol/L (ref 135–145)
Total Bilirubin: 0.5 mg/dL (ref 0.3–1.2)
Total Protein: 6.4 g/dL — ABNORMAL LOW (ref 6.5–8.1)

## 2020-05-10 LAB — CBC WITH DIFFERENTIAL/PLATELET
Abs Immature Granulocytes: 0.02 10*3/uL (ref 0.00–0.07)
Basophils Absolute: 0.1 10*3/uL (ref 0.0–0.1)
Basophils Relative: 1 %
Eosinophils Absolute: 2.1 10*3/uL — ABNORMAL HIGH (ref 0.0–0.5)
Eosinophils Relative: 25 %
HCT: 36.2 % (ref 36.0–46.0)
Hemoglobin: 12.1 g/dL (ref 12.0–15.0)
Immature Granulocytes: 0 %
Lymphocytes Relative: 21 %
Lymphs Abs: 1.8 10*3/uL (ref 0.7–4.0)
MCH: 27.8 pg (ref 26.0–34.0)
MCHC: 33.4 g/dL (ref 30.0–36.0)
MCV: 83.2 fL (ref 80.0–100.0)
Monocytes Absolute: 0.5 10*3/uL (ref 0.1–1.0)
Monocytes Relative: 6 %
Neutro Abs: 4 10*3/uL (ref 1.7–7.7)
Neutrophils Relative %: 47 %
Platelets: 205 10*3/uL (ref 150–400)
RBC: 4.35 MIL/uL (ref 3.87–5.11)
RDW: 13.2 % (ref 11.5–15.5)
WBC: 8.4 10*3/uL (ref 4.0–10.5)
nRBC: 0 % (ref 0.0–0.2)

## 2020-05-10 MED ORDER — SODIUM CHLORIDE 0.9% FLUSH
10.0000 mL | INTRAVENOUS | Status: DC | PRN
  Administered 2020-05-10: 10 mL
  Filled 2020-05-10: qty 10

## 2020-05-10 MED ORDER — INFLUENZA VAC A&B SA ADJ QUAD 0.5 ML IM PRSY
0.5000 mL | PREFILLED_SYRINGE | Freq: Once | INTRAMUSCULAR | Status: AC
  Administered 2020-05-10: 0.5 mL via INTRAMUSCULAR

## 2020-05-10 MED ORDER — INFLUENZA VAC A&B SA ADJ QUAD 0.5 ML IM PRSY
PREFILLED_SYRINGE | INTRAMUSCULAR | Status: AC
  Filled 2020-05-10: qty 0.5

## 2020-05-10 MED ORDER — SODIUM CHLORIDE 0.9 % IV SOLN
200.0000 mg | Freq: Once | INTRAVENOUS | Status: AC
  Administered 2020-05-10: 200 mg via INTRAVENOUS
  Filled 2020-05-10: qty 8

## 2020-05-10 MED ORDER — SODIUM CHLORIDE 0.9 % IV SOLN
Freq: Once | INTRAVENOUS | Status: AC
  Filled 2020-05-10: qty 250

## 2020-05-10 MED ORDER — HEPARIN SOD (PORK) LOCK FLUSH 100 UNIT/ML IV SOLN
500.0000 [IU] | Freq: Once | INTRAVENOUS | Status: AC | PRN
  Administered 2020-05-10: 500 [IU]
  Filled 2020-05-10: qty 5

## 2020-05-10 NOTE — Patient Instructions (Signed)
[facility] Discharge Instructions for Patients Receiving Chemotherapy  Today you received the following chemotherapy agents: pembrolizumab.  To help prevent nausea and vomiting after your treatment, we encourage you to take your nausea medication as directed.   If you develop nausea and vomiting that is not controlled by your nausea medication, call the clinic.   BELOW ARE SYMPTOMS THAT SHOULD BE REPORTED IMMEDIATELY:  *FEVER GREATER THAN 100.5 F  *CHILLS WITH OR WITHOUT FEVER  NAUSEA AND VOMITING THAT IS NOT CONTROLLED WITH YOUR NAUSEA MEDICATION  *UNUSUAL SHORTNESS OF BREATH  *UNUSUAL BRUISING OR BLEEDING  TENDERNESS IN MOUTH AND THROAT WITH OR WITHOUT PRESENCE OF ULCERS  *URINARY PROBLEMS  *BOWEL PROBLEMS  UNUSUAL RASH Items with * indicate a potential emergency and should be followed up as soon as possible.  Feel free to call the clinic should you have any questions or concerns. The clinic phone number is [phone].  Please show the CHEMO ALERT CARD at check-in to the Emergency Department and triage nurse.   

## 2020-05-10 NOTE — Patient Instructions (Signed)

## 2020-05-31 ENCOUNTER — Inpatient Hospital Stay (HOSPITAL_BASED_OUTPATIENT_CLINIC_OR_DEPARTMENT_OTHER): Payer: Medicare Other | Admitting: Hematology and Oncology

## 2020-05-31 ENCOUNTER — Telehealth: Payer: Self-pay

## 2020-05-31 ENCOUNTER — Other Ambulatory Visit: Payer: Self-pay | Admitting: Hematology and Oncology

## 2020-05-31 ENCOUNTER — Inpatient Hospital Stay: Payer: Medicare Other

## 2020-05-31 ENCOUNTER — Encounter: Payer: Self-pay | Admitting: Hematology and Oncology

## 2020-05-31 ENCOUNTER — Other Ambulatory Visit: Payer: Self-pay

## 2020-05-31 VITALS — BP 138/84 | HR 70 | Temp 96.9°F | Resp 18 | Wt 210.8 lb

## 2020-05-31 DIAGNOSIS — C541 Malignant neoplasm of endometrium: Secondary | ICD-10-CM

## 2020-05-31 DIAGNOSIS — C786 Secondary malignant neoplasm of retroperitoneum and peritoneum: Secondary | ICD-10-CM | POA: Diagnosis not present

## 2020-05-31 DIAGNOSIS — G620 Drug-induced polyneuropathy: Secondary | ICD-10-CM | POA: Diagnosis not present

## 2020-05-31 DIAGNOSIS — Z5112 Encounter for antineoplastic immunotherapy: Secondary | ICD-10-CM | POA: Diagnosis not present

## 2020-05-31 DIAGNOSIS — C787 Secondary malignant neoplasm of liver and intrahepatic bile duct: Secondary | ICD-10-CM

## 2020-05-31 DIAGNOSIS — T451X5A Adverse effect of antineoplastic and immunosuppressive drugs, initial encounter: Secondary | ICD-10-CM

## 2020-05-31 DIAGNOSIS — E039 Hypothyroidism, unspecified: Secondary | ICD-10-CM

## 2020-05-31 LAB — CBC WITH DIFFERENTIAL/PLATELET
Abs Immature Granulocytes: 0.02 10*3/uL (ref 0.00–0.07)
Basophils Absolute: 0.1 10*3/uL (ref 0.0–0.1)
Basophils Relative: 1 %
Eosinophils Absolute: 0.7 10*3/uL — ABNORMAL HIGH (ref 0.0–0.5)
Eosinophils Relative: 9 %
HCT: 36.2 % (ref 36.0–46.0)
Hemoglobin: 12.2 g/dL (ref 12.0–15.0)
Immature Granulocytes: 0 %
Lymphocytes Relative: 23 %
Lymphs Abs: 1.8 10*3/uL (ref 0.7–4.0)
MCH: 27.4 pg (ref 26.0–34.0)
MCHC: 33.7 g/dL (ref 30.0–36.0)
MCV: 81.3 fL (ref 80.0–100.0)
Monocytes Absolute: 0.5 10*3/uL (ref 0.1–1.0)
Monocytes Relative: 6 %
Neutro Abs: 4.8 10*3/uL (ref 1.7–7.7)
Neutrophils Relative %: 61 %
Platelets: 217 10*3/uL (ref 150–400)
RBC: 4.45 MIL/uL (ref 3.87–5.11)
RDW: 12.9 % (ref 11.5–15.5)
WBC: 7.9 10*3/uL (ref 4.0–10.5)
nRBC: 0 % (ref 0.0–0.2)

## 2020-05-31 LAB — COMPREHENSIVE METABOLIC PANEL
ALT: 10 U/L (ref 0–44)
AST: 15 U/L (ref 15–41)
Albumin: 3.4 g/dL — ABNORMAL LOW (ref 3.5–5.0)
Alkaline Phosphatase: 111 U/L (ref 38–126)
Anion gap: 8 (ref 5–15)
BUN: 18 mg/dL (ref 8–23)
CO2: 26 mmol/L (ref 22–32)
Calcium: 9.1 mg/dL (ref 8.9–10.3)
Chloride: 107 mmol/L (ref 98–111)
Creatinine, Ser: 0.8 mg/dL (ref 0.44–1.00)
GFR, Estimated: >60 mL/min (ref >60)
Glucose, Bld: 96 mg/dL (ref 70–99)
Potassium: 4 mmol/L (ref 3.5–5.1)
Sodium: 141 mmol/L (ref 135–145)
Total Bilirubin: 0.5 mg/dL (ref 0.3–1.2)
Total Protein: 6.7 g/dL (ref 6.5–8.1)

## 2020-05-31 LAB — TSH: TSH: 3.046 u[IU]/mL (ref 0.308–3.960)

## 2020-05-31 MED ORDER — SODIUM CHLORIDE 0.9% FLUSH
10.0000 mL | Freq: Once | INTRAVENOUS | Status: AC
  Administered 2020-05-31: 10 mL
  Filled 2020-05-31: qty 10

## 2020-05-31 MED ORDER — SODIUM CHLORIDE 0.9% FLUSH
10.0000 mL | INTRAVENOUS | Status: DC | PRN
  Administered 2020-05-31: 10 mL
  Filled 2020-05-31: qty 10

## 2020-05-31 MED ORDER — SODIUM CHLORIDE 0.9 % IV SOLN
Freq: Once | INTRAVENOUS | Status: AC
  Filled 2020-05-31: qty 250

## 2020-05-31 MED ORDER — OXYCODONE HCL 10 MG PO TABS
10.0000 mg | ORAL_TABLET | Freq: Two times a day (BID) | ORAL | 0 refills | Status: DC | PRN
Start: 2020-05-31 — End: 2020-08-23

## 2020-05-31 MED ORDER — SODIUM CHLORIDE 0.9 % IV SOLN
200.0000 mg | Freq: Once | INTRAVENOUS | Status: AC
  Administered 2020-05-31: 200 mg via INTRAVENOUS
  Filled 2020-05-31: qty 8

## 2020-05-31 MED ORDER — LEVOTHYROXINE SODIUM 200 MCG PO TABS: 200.0000 ug | ORAL_TABLET | Freq: Every day | ORAL | 3 refills | Status: DC

## 2020-05-31 MED ORDER — ALTEPLASE 2 MG IJ SOLR
2.0000 mg | Freq: Once | INTRAMUSCULAR | Status: AC
  Administered 2020-05-31: 2 mg
  Filled 2020-05-31: qty 2

## 2020-05-31 MED ORDER — ALTEPLASE 2 MG IJ SOLR
INTRAMUSCULAR | Status: AC
  Filled 2020-05-31: qty 2

## 2020-05-31 MED ORDER — HEPARIN SOD (PORK) LOCK FLUSH 100 UNIT/ML IV SOLN
500.0000 [IU] | Freq: Once | INTRAVENOUS | Status: AC | PRN
  Administered 2020-05-31: 500 [IU]
  Filled 2020-05-31: qty 5

## 2020-05-31 NOTE — Patient Instructions (Signed)
[facility] Discharge Instructions for Patients Receiving Chemotherapy  Today you received the following chemotherapy agents: pembrolizumab.  To help prevent nausea and vomiting after your treatment, we encourage you to take your nausea medication as directed.   If you develop nausea and vomiting that is not controlled by your nausea medication, call the clinic.   BELOW ARE SYMPTOMS THAT SHOULD BE REPORTED IMMEDIATELY:  *FEVER GREATER THAN 100.5 F  *CHILLS WITH OR WITHOUT FEVER  NAUSEA AND VOMITING THAT IS NOT CONTROLLED WITH YOUR NAUSEA MEDICATION  *UNUSUAL SHORTNESS OF BREATH  *UNUSUAL BRUISING OR BLEEDING  TENDERNESS IN MOUTH AND THROAT WITH OR WITHOUT PRESENCE OF ULCERS  *URINARY PROBLEMS  *BOWEL PROBLEMS  UNUSUAL RASH Items with * indicate a potential emergency and should be followed up as soon as possible.  Feel free to call the clinic should you have any questions or concerns. The clinic phone number is [phone].  Please show the CHEMO ALERT CARD at check-in to the Emergency Department and triage nurse.   

## 2020-05-31 NOTE — Patient Instructions (Signed)

## 2020-05-31 NOTE — Telephone Encounter (Signed)
Called and left a message per Dr. Alvy Bimler, [facility] lab work looks good and she sent a refill on synthroid Rx to pharmacy. Ask her to call back with questions.

## 2020-05-31 NOTE — Progress Notes (Signed)
[place] OFFICE PROGRESS NOTE  Patient Care Team: [name], MD as PCP - General (Family Medicine)  ASSESSMENT & PLAN:  Recurrent carcinoma of endometrium [facility]) Her last imaging study did not show signs of cancer progression and she tolerated treatment very well We will continue pembrolizumab indefinitely I will see her every other treatment The next imaging study would be around March 2022  Acquired hypothyroidism Her recent TSH is intermittently elevated She will continue chronic thyroid replacement therapy We will adjust her thyroid medicine as needed  Peripheral neuropathy due to chemotherapy [facility]) She had recent flare of peripheral neuropathy, likely exacerbated due to changes in the weather She is taking gabapentin along with occasional pain medicine as needed I recommend she continues the same I will refill her prescription of pain medicine to take as needed   No orders of the defined types were placed in this encounter.   All questions were answered. The patient knows to call the clinic with any problems, questions or concerns. The total time spent in the appointment was 20 minutes encounter with patients including review of chart and various tests results, discussions about plan of care and coordination of care plan   Heath Lark, MD 05/31/2020 2:09 PM  INTERVAL HISTORY: Please see below for problem oriented charting. She returns for treatment and follow-up She is doing well She noticed some slight worsening peripheral neuropathy recently She is taking her gabapentin and occasional pain medicine as needed No recent abdominal pain no changes in bowel habits No infusion reactions SUMMARY OF ONCOLOGIC HISTORY: Oncology History Overview Note  MSI stable on June 2017 tissue but high from [facility] study from November 2017  05/15/16: ER is moderately positive (70%). PR is strongly positive (80%).   Recurrent carcinoma of endometrium ([place])   01/02/2016 Pathology Results   Uterus +/- tubes/ovaries, neoplastic, cervix ENDOMETRIAL ADENOCARCINOMA, FIGO GRADE 2 (4.7 CM) THE TUMOR INVADES LESS THAN ONE-HALF OF THE MYOMETRIUM (PT1A) ALL MARGINS OF RESECTION ARE NEGATIVE FOR CARCINOMA LEIOMYOMAS AND ADENOMYOSIS BILATERAL FALLOPIAN TUBES AND OVARIES: HISTOLOGICAL UNREMARKABLE 2. Lymph node, sentinel, biopsy, right obturator ONE BENIGN LYMPH NODE (0/1) 3. Lymph nodes, regional resection, left pelvic FOUR BENIGN LYMPH NODES (0/4)   01/02/2016 Surgery   Dr. Denman George performed robotic-assisted laparoscopic total hysterectomy with bilateral salpingoophorectomy, sentinel lymph node biopsy, lymphadenectomy     05/15/2016 Pathology Results   Vagina, biopsy, mid - ADENOCARCINOMA, SEE COMMENT. Microscopic Comment The morphology along with the patient's history are consistent with recurrent endometrioid adenocarcinoma. The carcinoma has a similar appearance to the primary ([id number]).   05/20/2016 Imaging   Ct scan abdomen showed solid 2.5 cm peritoneal mass in the mid to anterior left pelvis, suspicious for peritoneal metastasis. 2. Small expansile low-attenuation filling defect in the left external iliac vein, cannot exclude a small deep venous thrombus. Consider correlation with left lower extremity venous Doppler scan. 3. Small simple fluid density structure in the left pelvic sidewall abutting the left external iliac vessels, favor a small postoperative seroma. 4. No ascites. 5. No lymphadenopathy.  No metastatic disease in the chest. 6. Aortic atherosclerosis.   06/12/2016 PET scan   Intensely hypermetabolic 2.1 cm central pelvic peritoneal mass just to the left of midline, consistent with peritoneal metastatic recurrence. No ascites. 2. No additional hypermetabolic sites of metastatic disease. 3. Diffuse thyroid hypermetabolism without discrete thyroid nodule, favoring thyroiditis. Recommend correlation with serum thyroid function tests.    06/24/2016 - 07/22/2016 Chemotherapy   The patient had weekly  cisplatin. She has missed several doses due to infection and pancytopenia   06/24/2016 - 09/04/2016 Radiation Therapy   She completed concurrent radiation therapy Radiation treatment dates:   IMRT : 06/24/16 - 08/01/16 HDR : 08/13/16, 08/20/16, 08/27/16, 09/04/16  Site/dose:   Pelvis treated to 55 Gy in 25 fractions (simultaneous integrated boost technique) Vaginal Cuff treated to 24 Gy in 4 fractions   08/15/2016 - 12/03/2016 Chemotherapy   She received 6 cycles of carboplatin/Taxol   09/27/2016 Imaging   Interval decrease in size of previously described solid peritoneal nodule within the left anterior pelvis. Near complete resolution of previously described low-attenuation structure along the left pelvic sidewall. No evidence for metastatic disease in the chest. Aortic atherosclerosis.   12/30/2016 Imaging   Ct abdomen 1. Solitary left pelvic peritoneal implant is mildly decreased in size in the interval. 2. No new or progressive metastatic disease in the abdomen or pelvis. No ascites. 3. Aortic atherosclerosis.   04/02/2017 Imaging   Left lower quadrant peritoneal implant referenced on previous exam measures 1.7 x 1.3 cm, image 69 of series 2. Increased from 0.8 x 0.8 cm previously peer no new peritoneal implants identified.  Musculoskeletal: The degenerative disc disease noted within the lumbar spine.  IMPRESSION: 1. Solitary left pelvic peritoneal implant is increased in size in the interval. 2. No new sites of disease.  No ascites. 3. Aortic atherosclerosis   04/11/2017 PET scan   1. The left side of pelvis peritoneal implant has decreased in size and degree of FDG uptake compatible with response to therapy. No new areas of peritoneal disease identified. 2. Persistent diffuse increased uptake within the thyroid gland. Correlation with patient's thyroid function may be helpful.   05/26/2017 Imaging   1. Enlarging tumor  implant along the left adnexa, currently 2.6 by 2.4 cm and previously 1.9 by 1.3 cm. No new tumor implant or other specific cause for the patient's pelvic symptoms is currently identified. 2.  Aortic Atherosclerosis (ICD10-I70.0). 3. Lumbar spondylosis and degenerative disc disease causing multilevel impingement.   06/04/2017 - 08/28/2017 Chemotherapy   The patient started letrozole and everolimus   08/26/2017 Imaging   Increased size of mass in the left adnexal region.  Several new small liver metastases in right hepatic lobe   09/08/2017 Imaging   LV EF: 60% -  65%   09/10/2017 Procedure   Technically successful right IJ power-injectable port catheter placement. Ready for routine use   12/04/2017 Imaging   1. Interval increase in size and number of multiple lesions within the liver compatible with hepatic metastatic disease. 2. Interval increase in size mass within the left hemipelvis.   12/15/2017 -  Chemotherapy   The patient had pembrolizumab (KEYTRUDA) 200 mg in sodium chloride 0.9 % 50 mL chemo infusion, 200 mg, Intravenous, Once, 1 of 6 cycles Administration: 200 mg (12/15/2017)  for chemotherapy treatment.    12/21/2017 Imaging   1. Moderate left hydronephrosis and hydroureter, similar compared to most recent CT from May 2019; obstruction appears to be secondary to a left pelvic mass/metastatic focus which has increased in size since the prior CT. 2. Numerous hepatic metastatic lesions, suspect that some may be increased in size but difficult to further characterize without intravenous contrast. Increased size of left pelvic soft tissue mass/metastatic lesion. Mass abuts and possibly invades the sigmoid colon.    02/20/2018 Imaging   Interval decrease in hepatic metastases.  Decreased mass or lymphadenopathy in the left external iliac chain. Interval resolution of left hydroureteronephrosis.  No new or progressive disease within the abdomen or pelvis.   05/16/2018 Imaging    05/16/2018 CT Abdomen IMPRESSION: 1. Slight interval decrease in size of hepatic metastatic disease. 2. Slight interval decrease in size centrally necrotic mass within the left external iliac region.   09/18/2018 Imaging   1. Today's study demonstrates a mixed response to therapy. Specifically, while the previously noted hepatic metastases appear decreased, the soft tissue mass along the left pelvic sidewall appears slightly larger than the prior study. No new metastatic disease is noted elsewhere in the abdomen or pelvis. 2. Aortic atherosclerosis. 3. Mild cardiomegaly. 4. Additional incidental findings, as above    03/12/2019 Imaging   1. Overall improvement with reduced size of the hepatic metastatic lesions, and reduced size of the irregular soft tissue mass in the left pelvis. 2. Other imaging findings of potential clinical significance: Mild distal esophageal wall thickening, cannot exclude esophagitis. Aortic Atherosclerosis (ICD10-I70.0). Lumbar impingement at L5-S1.     09/09/2019 Imaging   1. Stable size and appearance of the somewhat irregular left adnexal mass, currently 2.8 by 1.9 cm. 2. The scattered hepatic metastatic lesions are stable from 03/12/2019, and represent effectively treated metastatic lesions. 3. Other imaging findings of potential clinical significance: Airway thickening is present, suggesting bronchitis or reactive airways disease. Lumbar spondylosis and degenerative disc disease causing impingement at L4-5 and L5-S1. Small umbilical hernia contains adipose tissue.   Aortic Atherosclerosis (ICD10-I70.0).   03/07/2020 Imaging   1. Stable appearance of LEFT pelvic mass without new signs of disease. Mass remains closely approximated to the colon, in the vicinity of the distal LEFT ureter and the iliac vessels. 2. Stable signs of hepatic metastatic disease with calcified lesion at the dome of the liver and subtle areas of low attenuation elsewhere which show no  change.     REVIEW OF SYSTEMS:   Constitutional: Denies fevers, chills or abnormal weight loss Eyes: Denies blurriness of vision Ears, nose, mouth, throat, and face: Denies mucositis or sore throat Respiratory: Denies cough, dyspnea or wheezes Cardiovascular: Denies palpitation, chest discomfort or lower extremity swelling Gastrointestinal:  Denies nausea, heartburn or change in bowel habits Skin: Denies abnormal skin rashes Lymphatics: Denies new lymphadenopathy or easy bruising Behavioral/Psych: Mood is stable, no new changes  All other systems were reviewed with the patient and are negative.  I have reviewed the past medical history, past surgical history, social history and family history with the patient and they are unchanged from previous note.  ALLERGIES:  is allergic to latex and lisinopril.  MEDICATIONS:  Current Outpatient Medications  Medication Sig Dispense Refill  . acetaminophen (TYLENOL) 325 MG tablet Take 650 mg by mouth every 6 (six) hours as needed for mild pain or moderate pain.     Marland Kitchen amoxicillin (AMOXIL) 500 MG capsule Take 2,000 mg by mouth See admin instructions. Prior to dental appointment, takes 4 tables prior to appt    . Biotin w/ Vitamins C & E (HAIR/SKIN/NAILS PO) Take 1 tablet by mouth daily.    . Calcium Carb-Cholecalciferol (CALCIUM 600 + D PO) Take 2 tablets by mouth daily.    . chlorhexidine (PERIDEX) 0.12 % solution 15 mLs 2 (two) times daily.    . fluticasone (FLONASE) 50 MCG/ACT nasal spray Place 1 spray into both nostrils 2 (two) times daily as needed for allergies.   1  . gabapentin (NEURONTIN) 600 MG tablet TAKE 1 TABLET BY MOUTH TWICE A DAY FOR RESTLESS LEGS AND NEUROPATHY. 60 tablet 11  .  ibuprofen (ADVIL,MOTRIN) 200 MG tablet Take 400 mg by mouth every 8 (eight) hours as needed for mild pain.    Marland Kitchen levothyroxine (SYNTHROID) 200 MCG tablet Take 1 tablet (200 mcg total) by mouth daily. 90 tablet 3  . lidocaine-prilocaine (EMLA) cream APPLY TO  AFFECTED AREA ONCE 30 g 3  . loratadine (CLARITIN) 10 MG tablet Take 10 mg by mouth daily.    Marland Kitchen LORazepam (ATIVAN) 1 MG tablet TAKE 1 TABLET (1 MG TOTAL) BY MOUTH EVERY 8 (EIGHT) HOURS AS NEEDED FOR ANXIETY OR SLEEP. 90 tablet 0  . losartan-hydrochlorothiazide (HYZAAR) 100-12.5 MG tablet Take 0.5 tablets by mouth daily. 60 tablet 9  . magnesium oxide (MAG-OX) 400 MG tablet Take 1 tablet by mouth 2 (two) times daily.    . Melatonin 10 MG CAPS Take 10 mg by mouth at bedtime.    . meloxicam (MOBIC) 7.5 MG tablet May take 1 to 2 tablets with food a day as needed for pain.    . niacinamide 500 MG tablet Take 500 mg by mouth 3 (three) times daily with meals.    . ondansetron (ZOFRAN) 8 MG tablet Take 1 tablet (8 mg total) by mouth every 8 (eight) hours as needed for nausea. 60 tablet 1  . oxyCODONE 10 MG TABS Take 1 tablet (10 mg total) by mouth 2 (two) times daily as needed for severe pain. 60 tablet 0  . potassium gluconate 595 (99 K) MG TABS tablet Take by mouth.    . pramipexole (MIRAPEX) 0.5 MG tablet Take 1 mg by mouth at bedtime.    . traZODone (DESYREL) 50 MG tablet Take 1.5 tablets (75 mg total) by mouth at bedtime. (Patient taking differently: Take 50 mg by mouth at bedtime. ) 135 tablet 1   No current facility-administered medications for this visit.   Facility-Administered Medications Ordered in Other Visits  Medication Dose Route Frequency Provider Last Rate Last Admin  . heparin lock flush 100 unit/mL  500 Units Intracatheter Once PRN Alvy Bimler, Seraj Dunnam, MD      . sodium chloride flush (NS) 0.9 % injection 10 mL  10 mL Intracatheter PRN Alvy Bimler, Melven Stockard, MD        PHYSICAL EXAMINATION: ECOG PERFORMANCE STATUS: 1 - Symptomatic but completely ambulatory  Vitals:   05/31/20 1153  BP: 138/84  Pulse: 70  Resp: 18  Temp: (!) 96.9 F (36.1 C)  SpO2: 98%   Filed Weights   05/31/20 1153  Weight: 210 lb 12.8 oz (95.6 kg)    GENERAL:alert, no distress and comfortable Musculoskeletal:no  cyanosis of digits and no clubbing  NEURO: alert & oriented x 3 with fluent speech, no focal motor/sensory deficits  LABORATORY DATA:  I have reviewed the data as listed    Component Value Date/Time   NA 141 05/31/2020 1214   NA 141 07/09/2017 1312   K 4.0 05/31/2020 1214   K 3.9 07/09/2017 1312   CL 107 05/31/2020 1214   CO2 26 05/31/2020 1214   CO2 27 07/09/2017 1312   GLUCOSE 96 05/31/2020 1214   GLUCOSE 109 07/09/2017 1312   BUN 18 05/31/2020 1214   BUN 16.4 07/09/2017 1312   CREATININE 0.80 05/31/2020 1214   CREATININE 0.91 01/04/2019 1351   CREATININE 0.8 07/09/2017 1312   CALCIUM 9.1 05/31/2020 1214   CALCIUM 9.2 07/09/2017 1312   PROT 6.7 05/31/2020 1214   PROT 7.1 07/09/2017 1312   ALBUMIN 3.4 (L) 05/31/2020 1214   ALBUMIN 3.1 (L) 07/09/2017 1312   AST  15 05/31/2020 1214   AST 19 01/04/2019 1351   AST 35 (H) 07/09/2017 1312   ALT 10 05/31/2020 1214   ALT 20 01/04/2019 1351   ALT 43 07/09/2017 1312   ALKPHOS 111 05/31/2020 1214   ALKPHOS 182 (H) 07/09/2017 1312   BILITOT 0.5 05/31/2020 1214   BILITOT 0.3 01/04/2019 1351   BILITOT 0.34 07/09/2017 1312   GFRNONAA >60 05/31/2020 1214   GFRNONAA >60 01/04/2019 1351   GFRAA >60 03/29/2020 1105   GFRAA >60 01/04/2019 1351    No results found for: SPEP, UPEP  Lab Results  Component Value Date   WBC 7.9 05/31/2020   NEUTROABS 4.8 05/31/2020   HGB 12.2 05/31/2020   HCT 36.2 05/31/2020   MCV 81.3 05/31/2020   PLT 217 05/31/2020      Chemistry      Component Value Date/Time   NA 141 05/31/2020 1214   NA 141 07/09/2017 1312   K 4.0 05/31/2020 1214   K 3.9 07/09/2017 1312   CL 107 05/31/2020 1214   CO2 26 05/31/2020 1214   CO2 27 07/09/2017 1312   BUN 18 05/31/2020 1214   BUN 16.4 07/09/2017 1312   CREATININE 0.80 05/31/2020 1214   CREATININE 0.91 01/04/2019 1351   CREATININE 0.8 07/09/2017 1312      Component Value Date/Time   CALCIUM 9.1 05/31/2020 1214   CALCIUM 9.2 07/09/2017 1312   ALKPHOS  111 05/31/2020 1214   ALKPHOS 182 (H) 07/09/2017 1312   AST 15 05/31/2020 1214   AST 19 01/04/2019 1351   AST 35 (H) 07/09/2017 1312   ALT 10 05/31/2020 1214   ALT 20 01/04/2019 1351   ALT 43 07/09/2017 1312   BILITOT 0.5 05/31/2020 1214   BILITOT 0.3 01/04/2019 1351   BILITOT 0.34 07/09/2017 1312

## 2020-05-31 NOTE — Assessment & Plan Note (Signed)
Her recent TSH is intermittently elevated She will continue chronic thyroid replacement therapy We will adjust her thyroid medicine as needed

## 2020-05-31 NOTE — Assessment & Plan Note (Signed)
She had recent flare of peripheral neuropathy, likely exacerbated due to changes in the weather She is taking gabapentin along with occasional pain medicine as needed I recommend she continues the same I will refill her prescription of pain medicine to take as needed

## 2020-05-31 NOTE — Assessment & Plan Note (Signed)
Her last imaging study did not show signs of cancer progression and she tolerated treatment very well We will continue pembrolizumab indefinitely I will see her every other treatment The next imaging study would be around March 2022

## 2020-06-09 ENCOUNTER — Ambulatory Visit: Payer: Medicare Other

## 2020-06-09 ENCOUNTER — Other Ambulatory Visit: Payer: Medicare Other

## 2020-06-21 ENCOUNTER — Inpatient Hospital Stay: Payer: Medicare Other

## 2020-06-21 ENCOUNTER — Other Ambulatory Visit: Payer: Self-pay

## 2020-06-21 ENCOUNTER — Inpatient Hospital Stay: Payer: Medicare Other | Attending: Hematology and Oncology

## 2020-06-21 VITALS — BP 161/80 | HR 77 | Temp 98.1°F | Resp 18 | Wt 211.0 lb

## 2020-06-21 DIAGNOSIS — Z79899 Other long term (current) drug therapy: Secondary | ICD-10-CM | POA: Diagnosis not present

## 2020-06-21 DIAGNOSIS — Z5112 Encounter for antineoplastic immunotherapy: Secondary | ICD-10-CM | POA: Insufficient documentation

## 2020-06-21 DIAGNOSIS — C541 Malignant neoplasm of endometrium: Secondary | ICD-10-CM

## 2020-06-21 DIAGNOSIS — Z923 Personal history of irradiation: Secondary | ICD-10-CM | POA: Insufficient documentation

## 2020-06-21 DIAGNOSIS — E039 Hypothyroidism, unspecified: Secondary | ICD-10-CM

## 2020-06-21 DIAGNOSIS — C787 Secondary malignant neoplasm of liver and intrahepatic bile duct: Secondary | ICD-10-CM

## 2020-06-21 DIAGNOSIS — Z9221 Personal history of antineoplastic chemotherapy: Secondary | ICD-10-CM | POA: Diagnosis not present

## 2020-06-21 DIAGNOSIS — E038 Other specified hypothyroidism: Secondary | ICD-10-CM | POA: Diagnosis not present

## 2020-06-21 DIAGNOSIS — G620 Drug-induced polyneuropathy: Secondary | ICD-10-CM | POA: Diagnosis not present

## 2020-06-21 LAB — CBC WITH DIFFERENTIAL/PLATELET
Abs Immature Granulocytes: 0.02 10*3/uL (ref 0.00–0.07)
Basophils Absolute: 0.1 10*3/uL (ref 0.0–0.1)
Basophils Relative: 1 %
Eosinophils Absolute: 0.8 10*3/uL — ABNORMAL HIGH (ref 0.0–0.5)
Eosinophils Relative: 11 %
HCT: 37.4 % (ref 36.0–46.0)
Hemoglobin: 12.3 g/dL (ref 12.0–15.0)
Immature Granulocytes: 0 %
Lymphocytes Relative: 23 %
Lymphs Abs: 1.7 10*3/uL (ref 0.7–4.0)
MCH: 27.4 pg (ref 26.0–34.0)
MCHC: 32.9 g/dL (ref 30.0–36.0)
MCV: 83.3 fL (ref 80.0–100.0)
Monocytes Absolute: 0.5 10*3/uL (ref 0.1–1.0)
Monocytes Relative: 7 %
Neutro Abs: 4.3 10*3/uL (ref 1.7–7.7)
Neutrophils Relative %: 58 %
Platelets: 198 10*3/uL (ref 150–400)
RBC: 4.49 MIL/uL (ref 3.87–5.11)
RDW: 13 % (ref 11.5–15.5)
WBC: 7.3 10*3/uL (ref 4.0–10.5)
nRBC: 0 % (ref 0.0–0.2)

## 2020-06-21 LAB — COMPREHENSIVE METABOLIC PANEL
ALT: 15 U/L (ref 0–44)
AST: 16 U/L (ref 15–41)
Albumin: 3.4 g/dL — ABNORMAL LOW (ref 3.5–5.0)
Alkaline Phosphatase: 106 U/L (ref 38–126)
Anion gap: 6 (ref 5–15)
BUN: 21 mg/dL (ref 8–23)
CO2: 28 mmol/L (ref 22–32)
Calcium: 9.1 mg/dL (ref 8.9–10.3)
Chloride: 107 mmol/L (ref 98–111)
Creatinine, Ser: 0.8 mg/dL (ref 0.44–1.00)
GFR, Estimated: >60 mL/min (ref >60)
Glucose, Bld: 92 mg/dL (ref 70–99)
Potassium: 4 mmol/L (ref 3.5–5.1)
Sodium: 141 mmol/L (ref 135–145)
Total Bilirubin: 0.7 mg/dL (ref 0.3–1.2)
Total Protein: 6.6 g/dL (ref 6.5–8.1)

## 2020-06-21 LAB — TSH: TSH: 7.891 u[IU]/mL — ABNORMAL HIGH (ref 0.308–3.960)

## 2020-06-21 MED ORDER — SODIUM CHLORIDE 0.9 % IV SOLN
200.0000 mg | Freq: Once | INTRAVENOUS | Status: AC
  Administered 2020-06-21: 200 mg via INTRAVENOUS
  Filled 2020-06-21: qty 8

## 2020-06-21 MED ORDER — SODIUM CHLORIDE 0.9 % IV SOLN
Freq: Once | INTRAVENOUS | Status: AC
  Filled 2020-06-21: qty 250

## 2020-06-21 MED ORDER — HEPARIN SOD (PORK) LOCK FLUSH 100 UNIT/ML IV SOLN
500.0000 [IU] | Freq: Once | INTRAVENOUS | Status: AC | PRN
  Administered 2020-06-21: 500 [IU]
  Filled 2020-06-21: qty 5

## 2020-06-21 MED ORDER — SODIUM CHLORIDE 0.9% FLUSH
10.0000 mL | INTRAVENOUS | Status: DC | PRN
  Administered 2020-06-21: 10 mL
  Filled 2020-06-21: qty 10

## 2020-07-12 ENCOUNTER — Other Ambulatory Visit: Payer: Self-pay

## 2020-07-12 ENCOUNTER — Inpatient Hospital Stay (HOSPITAL_BASED_OUTPATIENT_CLINIC_OR_DEPARTMENT_OTHER): Payer: Medicare Other | Admitting: Hematology and Oncology

## 2020-07-12 ENCOUNTER — Inpatient Hospital Stay: Payer: Medicare Other | Attending: Hematology and Oncology

## 2020-07-12 ENCOUNTER — Inpatient Hospital Stay: Payer: Medicare Other

## 2020-07-12 DIAGNOSIS — B001 Herpesviral vesicular dermatitis: Secondary | ICD-10-CM | POA: Diagnosis not present

## 2020-07-12 DIAGNOSIS — C541 Malignant neoplasm of endometrium: Secondary | ICD-10-CM

## 2020-07-12 DIAGNOSIS — Z9079 Acquired absence of other genital organ(s): Secondary | ICD-10-CM | POA: Insufficient documentation

## 2020-07-12 DIAGNOSIS — Z79899 Other long term (current) drug therapy: Secondary | ICD-10-CM | POA: Diagnosis not present

## 2020-07-12 DIAGNOSIS — E039 Hypothyroidism, unspecified: Secondary | ICD-10-CM

## 2020-07-12 DIAGNOSIS — Z5112 Encounter for antineoplastic immunotherapy: Secondary | ICD-10-CM | POA: Diagnosis present

## 2020-07-12 DIAGNOSIS — Z9221 Personal history of antineoplastic chemotherapy: Secondary | ICD-10-CM | POA: Diagnosis not present

## 2020-07-12 DIAGNOSIS — Z923 Personal history of irradiation: Secondary | ICD-10-CM | POA: Diagnosis not present

## 2020-07-12 DIAGNOSIS — B009 Herpesviral infection, unspecified: Secondary | ICD-10-CM | POA: Diagnosis not present

## 2020-07-12 DIAGNOSIS — C787 Secondary malignant neoplasm of liver and intrahepatic bile duct: Secondary | ICD-10-CM | POA: Insufficient documentation

## 2020-07-12 DIAGNOSIS — Z90722 Acquired absence of ovaries, bilateral: Secondary | ICD-10-CM | POA: Diagnosis not present

## 2020-07-12 DIAGNOSIS — Z90710 Acquired absence of both cervix and uterus: Secondary | ICD-10-CM | POA: Diagnosis not present

## 2020-07-12 LAB — COMPREHENSIVE METABOLIC PANEL
ALT: 13 U/L (ref 0–44)
AST: 16 U/L (ref 15–41)
Albumin: 3.3 g/dL — ABNORMAL LOW (ref 3.5–5.0)
Alkaline Phosphatase: 103 U/L (ref 38–126)
Anion gap: 10 (ref 5–15)
BUN: 21 mg/dL (ref 8–23)
CO2: 24 mmol/L (ref 22–32)
Calcium: 9.2 mg/dL (ref 8.9–10.3)
Chloride: 106 mmol/L (ref 98–111)
Creatinine, Ser: 0.78 mg/dL (ref 0.44–1.00)
GFR, Estimated: >60 mL/min (ref >60)
Glucose, Bld: 104 mg/dL — ABNORMAL HIGH (ref 70–99)
Potassium: 3.7 mmol/L (ref 3.5–5.1)
Sodium: 140 mmol/L (ref 135–145)
Total Bilirubin: 0.6 mg/dL (ref 0.3–1.2)
Total Protein: 6.5 g/dL (ref 6.5–8.1)

## 2020-07-12 LAB — CBC WITH DIFFERENTIAL/PLATELET
Abs Immature Granulocytes: 0.02 10*3/uL (ref 0.00–0.07)
Basophils Absolute: 0.1 10*3/uL (ref 0.0–0.1)
Basophils Relative: 1 %
Eosinophils Absolute: 0.6 10*3/uL — ABNORMAL HIGH (ref 0.0–0.5)
Eosinophils Relative: 8 %
HCT: 36.5 % (ref 36.0–46.0)
Hemoglobin: 11.9 g/dL — ABNORMAL LOW (ref 12.0–15.0)
Immature Granulocytes: 0 %
Lymphocytes Relative: 21 %
Lymphs Abs: 1.6 10*3/uL (ref 0.7–4.0)
MCH: 27.1 pg (ref 26.0–34.0)
MCHC: 32.6 g/dL (ref 30.0–36.0)
MCV: 83.1 fL (ref 80.0–100.0)
Monocytes Absolute: 0.5 10*3/uL (ref 0.1–1.0)
Monocytes Relative: 7 %
Neutro Abs: 4.8 10*3/uL (ref 1.7–7.7)
Neutrophils Relative %: 63 %
Platelets: 213 10*3/uL (ref 150–400)
RBC: 4.39 MIL/uL (ref 3.87–5.11)
RDW: 13 % (ref 11.5–15.5)
WBC: 7.6 10*3/uL (ref 4.0–10.5)
nRBC: 0 % (ref 0.0–0.2)

## 2020-07-12 MED ORDER — LORAZEPAM 1 MG PO TABS: 1.0000 mg | ORAL_TABLET | Freq: Three times a day (TID) | ORAL | 0 refills | Status: DC | PRN

## 2020-07-12 MED ORDER — SODIUM CHLORIDE 0.9 % IV SOLN
200.0000 mg | Freq: Once | INTRAVENOUS | Status: AC
  Administered 2020-07-12: 200 mg via INTRAVENOUS
  Filled 2020-07-12: qty 8

## 2020-07-12 MED ORDER — SODIUM CHLORIDE 0.9 % IV SOLN
Freq: Once | INTRAVENOUS | Status: AC
  Filled 2020-07-12: qty 250

## 2020-07-12 MED ORDER — HEPARIN SOD (PORK) LOCK FLUSH 100 UNIT/ML IV SOLN
500.0000 [IU] | Freq: Once | INTRAVENOUS | Status: AC | PRN
  Administered 2020-07-12: 500 [IU]
  Filled 2020-07-12: qty 5

## 2020-07-12 MED ORDER — SODIUM CHLORIDE 0.9% FLUSH
10.0000 mL | INTRAVENOUS | Status: DC | PRN
  Administered 2020-07-12 (×2): 10 mL
  Filled 2020-07-12: qty 10

## 2020-07-12 MED ORDER — VALACYCLOVIR HCL 1 G PO TABS: 1000.0000 mg | ORAL_TABLET | Freq: Two times a day (BID) | ORAL | 0 refills | Status: DC

## 2020-07-12 MED ORDER — SODIUM CHLORIDE 0.9% FLUSH
10.0000 mL | Freq: Once | INTRAVENOUS | Status: AC
  Administered 2020-07-12: 10 mL
  Filled 2020-07-12: qty 10

## 2020-07-12 NOTE — Patient Instructions (Signed)
[facility] Discharge Instructions for Patients Receiving Chemotherapy  Today you received the following chemotherapy agents Pembrolizumab (Keytruda).  To help prevent nausea and vomiting after your treatment, we encourage you to take your nausea medication as prescribed.   If you develop nausea and vomiting that is not controlled by your nausea medication, call the clinic.   BELOW ARE SYMPTOMS THAT SHOULD BE REPORTED IMMEDIATELY:  *FEVER GREATER THAN 100.5 F  *CHILLS WITH OR WITHOUT FEVER  NAUSEA AND VOMITING THAT IS NOT CONTROLLED WITH YOUR NAUSEA MEDICATION  *UNUSUAL SHORTNESS OF BREATH  *UNUSUAL BRUISING OR BLEEDING  TENDERNESS IN MOUTH AND THROAT WITH OR WITHOUT PRESENCE OF ULCERS  *URINARY PROBLEMS  *BOWEL PROBLEMS  UNUSUAL RASH Items with * indicate a potential emergency and should be followed up as soon as possible.  Feel free to call the clinic should you have any questions or concerns. The clinic phone number is [phone].  Please show the CHEMO ALERT CARD at check-in to the Emergency Department and triage nurse.   

## 2020-07-12 NOTE — Patient Instructions (Signed)

## 2020-07-13 ENCOUNTER — Encounter: Payer: Self-pay | Admitting: Hematology and Oncology

## 2020-07-13 NOTE — Progress Notes (Signed)
[place] OFFICE PROGRESS NOTE  Patient Care Team: [name], MD as PCP - General (Family Medicine)  ASSESSMENT & PLAN:  Recurrent carcinoma of endometrium [facility]) Her last imaging study did not show signs of cancer progression and she tolerated treatment very well We will continue pembrolizumab indefinitely I will see her every other treatment The next imaging study would be around March 2022  Herpes simplex infection She has recent mild outbreak with herpes simplex infection I recommend a course of Valtrex  Acquired hypothyroidism Her recent TSH is intermittently elevated She will continue chronic thyroid replacement therapy We will adjust her thyroid medicine as needed   No orders of the defined types were placed in this encounter.   All questions were answered. The patient knows to call the clinic with any problems, questions or concerns. The total time spent in the appointment was 20 minutes encounter with patients including review of chart and various tests results, discussions about plan of care and coordination of care plan   Heath Lark, MD 07/13/2020 9:01 AM  INTERVAL HISTORY: Please see below for problem oriented charting. She returns for further follow-up She is doing well She had recent outbreak of herpes simplex infection, resolving Denies abdominal pain Appetite is fair  SUMMARY OF ONCOLOGIC HISTORY: Oncology History Overview Note  MSI stable on June 2017 tissue but high from [facility] study from November 2017  05/15/16: ER is moderately positive (70%). PR is strongly positive (80%).   Recurrent carcinoma of endometrium ([place])  01/02/2016 Pathology Results   Uterus +/- tubes/ovaries, neoplastic, cervix ENDOMETRIAL ADENOCARCINOMA, FIGO GRADE 2 (4.7 CM) THE TUMOR INVADES LESS THAN ONE-HALF OF THE MYOMETRIUM (PT1A) ALL MARGINS OF RESECTION ARE NEGATIVE FOR CARCINOMA LEIOMYOMAS AND ADENOMYOSIS BILATERAL FALLOPIAN TUBES AND OVARIES:  HISTOLOGICAL UNREMARKABLE 2. Lymph node, sentinel, biopsy, right obturator ONE BENIGN LYMPH NODE (0/1) 3. Lymph nodes, regional resection, left pelvic FOUR BENIGN LYMPH NODES (0/4)   01/02/2016 Surgery   Dr. Denman George performed robotic-assisted laparoscopic total hysterectomy with bilateral salpingoophorectomy, sentinel lymph node biopsy, lymphadenectomy     05/15/2016 Pathology Results   Vagina, biopsy, mid - ADENOCARCINOMA, SEE COMMENT. Microscopic Comment The morphology along with the patient's history are consistent with recurrent endometrioid adenocarcinoma. The carcinoma has a similar appearance to the primary ([id number]).   05/20/2016 Imaging   Ct scan abdomen showed solid 2.5 cm peritoneal mass in the mid to anterior left pelvis, suspicious for peritoneal metastasis. 2. Small expansile low-attenuation filling defect in the left external iliac vein, cannot exclude a small deep venous thrombus. Consider correlation with left lower extremity venous Doppler scan. 3. Small simple fluid density structure in the left pelvic sidewall abutting the left external iliac vessels, favor a small postoperative seroma. 4. No ascites. 5. No lymphadenopathy.  No metastatic disease in the chest. 6. Aortic atherosclerosis.   06/12/2016 PET scan   Intensely hypermetabolic 2.1 cm central pelvic peritoneal mass just to the left of midline, consistent with peritoneal metastatic recurrence. No ascites. 2. No additional hypermetabolic sites of metastatic disease. 3. Diffuse thyroid hypermetabolism without discrete thyroid nodule, favoring thyroiditis. Recommend correlation with serum thyroid function tests.   06/24/2016 - 07/22/2016 Chemotherapy   The patient had weekly cisplatin. She has missed several doses due to infection and pancytopenia   06/24/2016 - 09/04/2016 Radiation Therapy   She completed concurrent radiation therapy Radiation treatment dates:   IMRT : 06/24/16 - 08/01/16 HDR : 08/13/16, 08/20/16,  08/27/16, 09/04/16  Site/dose:   Pelvis treated to  55 Gy in 25 fractions (simultaneous integrated boost technique) Vaginal Cuff treated to 24 Gy in 4 fractions   08/15/2016 - 12/03/2016 Chemotherapy   She received 6 cycles of carboplatin/Taxol   09/27/2016 Imaging   Interval decrease in size of previously described solid peritoneal nodule within the left anterior pelvis. Near complete resolution of previously described low-attenuation structure along the left pelvic sidewall. No evidence for metastatic disease in the chest. Aortic atherosclerosis.   12/30/2016 Imaging   Ct abdomen 1. Solitary left pelvic peritoneal implant is mildly decreased in size in the interval. 2. No new or progressive metastatic disease in the abdomen or pelvis. No ascites. 3. Aortic atherosclerosis.   04/02/2017 Imaging   Left lower quadrant peritoneal implant referenced on previous exam measures 1.7 x 1.3 cm, image 69 of series 2. Increased from 0.8 x 0.8 cm previously peer no new peritoneal implants identified.  Musculoskeletal: The degenerative disc disease noted within the lumbar spine.  IMPRESSION: 1. Solitary left pelvic peritoneal implant is increased in size in the interval. 2. No new sites of disease.  No ascites. 3. Aortic atherosclerosis   04/11/2017 PET scan   1. The left side of pelvis peritoneal implant has decreased in size and degree of FDG uptake compatible with response to therapy. No new areas of peritoneal disease identified. 2. Persistent diffuse increased uptake within the thyroid gland. Correlation with patient's thyroid function may be helpful.   05/26/2017 Imaging   1. Enlarging tumor implant along the left adnexa, currently 2.6 by 2.4 cm and previously 1.9 by 1.3 cm. No new tumor implant or other specific cause for the patient's pelvic symptoms is currently identified. 2.  Aortic Atherosclerosis (ICD10-I70.0). 3. Lumbar spondylosis and degenerative disc disease causing multilevel  impingement.   06/04/2017 - 08/28/2017 Chemotherapy   The patient started letrozole and everolimus   08/26/2017 Imaging   Increased size of mass in the left adnexal region.  Several new small liver metastases in right hepatic lobe   09/08/2017 Imaging   LV EF: 60% -  65%   09/10/2017 Procedure   Technically successful right IJ power-injectable port catheter placement. Ready for routine use   12/04/2017 Imaging   1. Interval increase in size and number of multiple lesions within the liver compatible with hepatic metastatic disease. 2. Interval increase in size mass within the left hemipelvis.   12/15/2017 -  Chemotherapy   The patient had pembrolizumab (KEYTRUDA) 200 mg in sodium chloride 0.9 % 50 mL chemo infusion, 200 mg, Intravenous, Once, 1 of 6 cycles Administration: 200 mg (12/15/2017)  for chemotherapy treatment.    12/21/2017 Imaging   1. Moderate left hydronephrosis and hydroureter, similar compared to most recent CT from May 2019; obstruction appears to be secondary to a left pelvic mass/metastatic focus which has increased in size since the prior CT. 2. Numerous hepatic metastatic lesions, suspect that some may be increased in size but difficult to further characterize without intravenous contrast. Increased size of left pelvic soft tissue mass/metastatic lesion. Mass abuts and possibly invades the sigmoid colon.    02/20/2018 Imaging   Interval decrease in hepatic metastases.  Decreased mass or lymphadenopathy in the left external iliac chain. Interval resolution of left hydroureteronephrosis.  No new or progressive disease within the abdomen or pelvis.   05/16/2018 Imaging   05/16/2018 CT Abdomen IMPRESSION: 1. Slight interval decrease in size of hepatic metastatic disease. 2. Slight interval decrease in size centrally necrotic mass within the left external iliac region.  09/18/2018 Imaging   1. Today's study demonstrates a mixed response to therapy. Specifically, while  the previously noted hepatic metastases appear decreased, the soft tissue mass along the left pelvic sidewall appears slightly larger than the prior study. No new metastatic disease is noted elsewhere in the abdomen or pelvis. 2. Aortic atherosclerosis. 3. Mild cardiomegaly. 4. Additional incidental findings, as above    03/12/2019 Imaging   1. Overall improvement with reduced size of the hepatic metastatic lesions, and reduced size of the irregular soft tissue mass in the left pelvis. 2. Other imaging findings of potential clinical significance: Mild distal esophageal wall thickening, cannot exclude esophagitis. Aortic Atherosclerosis (ICD10-I70.0). Lumbar impingement at L5-S1.     09/09/2019 Imaging   1. Stable size and appearance of the somewhat irregular left adnexal mass, currently 2.8 by 1.9 cm. 2. The scattered hepatic metastatic lesions are stable from 03/12/2019, and represent effectively treated metastatic lesions. 3. Other imaging findings of potential clinical significance: Airway thickening is present, suggesting bronchitis or reactive airways disease. Lumbar spondylosis and degenerative disc disease causing impingement at L4-5 and L5-S1. Small umbilical hernia contains adipose tissue.   Aortic Atherosclerosis (ICD10-I70.0).   03/07/2020 Imaging   1. Stable appearance of LEFT pelvic mass without new signs of disease. Mass remains closely approximated to the colon, in the vicinity of the distal LEFT ureter and the iliac vessels. 2. Stable signs of hepatic metastatic disease with calcified lesion at the dome of the liver and subtle areas of low attenuation elsewhere which show no change.     REVIEW OF SYSTEMS:   Constitutional: Denies fevers, chills or abnormal weight loss Eyes: Denies blurriness of vision Ears, nose, mouth, throat, and face: Denies mucositis or sore throat Respiratory: Denies cough, dyspnea or wheezes Cardiovascular: Denies palpitation, chest discomfort or  lower extremity swelling Gastrointestinal:  Denies nausea, heartburn or change in bowel habits Skin: Denies abnormal skin rashes Lymphatics: Denies new lymphadenopathy or easy bruising Neurological:Denies numbness, tingling or new weaknesses Behavioral/Psych: Mood is stable, no new changes  All other systems were reviewed with the patient and are negative.  I have reviewed the past medical history, past surgical history, social history and family history with the patient and they are unchanged from previous note.  ALLERGIES:  is allergic to latex and lisinopril.  MEDICATIONS:  Current Outpatient Medications  Medication Sig Dispense Refill  . valACYclovir (VALTREX) 1000 MG tablet Take 1 tablet (1,000 mg total) by mouth 2 (two) times daily. 20 tablet 0  . acetaminophen (TYLENOL) 325 MG tablet Take 650 mg by mouth every 6 (six) hours as needed for mild pain or moderate pain.     Marland Kitchen amoxicillin (AMOXIL) 500 MG capsule Take 2,000 mg by mouth See admin instructions. Prior to dental appointment, takes 4 tables prior to appt    . Biotin w/ Vitamins C & E (HAIR/SKIN/NAILS PO) Take 1 tablet by mouth daily.    . Calcium Carb-Cholecalciferol (CALCIUM 600 + D PO) Take 2 tablets by mouth daily.    . chlorhexidine (PERIDEX) 0.12 % solution 15 mLs 2 (two) times daily.    . fluticasone (FLONASE) 50 MCG/ACT nasal spray Place 1 spray into both nostrils 2 (two) times daily as needed for allergies.   1  . gabapentin (NEURONTIN) 600 MG tablet TAKE 1 TABLET BY MOUTH TWICE A DAY FOR RESTLESS LEGS AND NEUROPATHY. 60 tablet 11  . ibuprofen (ADVIL,MOTRIN) 200 MG tablet Take 400 mg by mouth every 8 (eight) hours as needed for mild pain.    Marland Kitchen  levothyroxine (SYNTHROID) 200 MCG tablet Take 1 tablet (200 mcg total) by mouth daily. 90 tablet 3  . lidocaine-prilocaine (EMLA) cream APPLY TO AFFECTED AREA ONCE 30 g 3  . loratadine (CLARITIN) 10 MG tablet Take 10 mg by mouth daily.    Marland Kitchen LORazepam (ATIVAN) 1 MG tablet Take 1  tablet (1 mg total) by mouth every 8 (eight) hours as needed for anxiety or sleep. 90 tablet 0  . losartan-hydrochlorothiazide (HYZAAR) 100-12.5 MG tablet Take 0.5 tablets by mouth daily. 60 tablet 9  . magnesium oxide (MAG-OX) 400 MG tablet Take 1 tablet by mouth 2 (two) times daily.    . Melatonin 10 MG CAPS Take 10 mg by mouth at bedtime.    . meloxicam (MOBIC) 7.5 MG tablet May take 1 to 2 tablets with food a day as needed for pain.    . niacinamide 500 MG tablet Take 500 mg by mouth 3 (three) times daily with meals.    . ondansetron (ZOFRAN) 8 MG tablet Take 1 tablet (8 mg total) by mouth every 8 (eight) hours as needed for nausea. 60 tablet 1  . oxyCODONE 10 MG TABS Take 1 tablet (10 mg total) by mouth 2 (two) times daily as needed for severe pain. 60 tablet 0  . potassium gluconate 595 (99 K) MG TABS tablet Take by mouth.    . pramipexole (MIRAPEX) 0.5 MG tablet Take 1 mg by mouth at bedtime.    . traZODone (DESYREL) 50 MG tablet Take 1.5 tablets (75 mg total) by mouth at bedtime. (Patient taking differently: Take 50 mg by mouth at bedtime. ) 135 tablet 1   No current facility-administered medications for this visit.    PHYSICAL EXAMINATION: ECOG PERFORMANCE STATUS: 1 - Symptomatic but completely ambulatory  Vitals:   07/12/20 1208  BP: (!) 162/81  Pulse: 68  Resp: 18  Temp: 97.6 F (36.4 C)  SpO2: 98%   Filed Weights   07/12/20 1208  Weight: 209 lb 12.8 oz (95.2 kg)    GENERAL:alert, no distress and comfortable SKIN: Noted herpes simplex infection affecting her lip NECK: supple, thyroid normal size, non-tender, without nodularity LYMPH:  no palpable lymphadenopathy in the cervical, axillary or inguinal LUNGS: clear to auscultation and percussion with normal breathing effort HEART: regular rate & rhythm and no murmurs and no lower extremity edema ABDOMEN:abdomen soft, non-tender and normal bowel sounds Musculoskeletal:no cyanosis of digits and no clubbing  NEURO: alert  & oriented x 3 with fluent speech, no focal motor/sensory deficits  LABORATORY DATA:  I have reviewed the data as listed    Component Value Date/Time   NA 140 07/12/2020 1113   NA 141 07/09/2017 1312   K 3.7 07/12/2020 1113   K 3.9 07/09/2017 1312   CL 106 07/12/2020 1113   CO2 24 07/12/2020 1113   CO2 27 07/09/2017 1312   GLUCOSE 104 (H) 07/12/2020 1113   GLUCOSE 109 07/09/2017 1312   BUN 21 07/12/2020 1113   BUN 16.4 07/09/2017 1312   CREATININE 0.78 07/12/2020 1113   CREATININE 0.91 01/04/2019 1351   CREATININE 0.8 07/09/2017 1312   CALCIUM 9.2 07/12/2020 1113   CALCIUM 9.2 07/09/2017 1312   PROT 6.5 07/12/2020 1113   PROT 7.1 07/09/2017 1312   ALBUMIN 3.3 (L) 07/12/2020 1113   ALBUMIN 3.1 (L) 07/09/2017 1312   AST 16 07/12/2020 1113   AST 19 01/04/2019 1351   AST 35 (H) 07/09/2017 1312   ALT 13 07/12/2020 1113   ALT 20  01/04/2019 1351   ALT 43 07/09/2017 1312   ALKPHOS 103 07/12/2020 1113   ALKPHOS 182 (H) 07/09/2017 1312   BILITOT 0.6 07/12/2020 1113   BILITOT 0.3 01/04/2019 1351   BILITOT 0.34 07/09/2017 1312   GFRNONAA >60 07/12/2020 1113   GFRNONAA >60 01/04/2019 1351   GFRAA >60 03/29/2020 1105   GFRAA >60 01/04/2019 1351    No results found for: SPEP, UPEP  Lab Results  Component Value Date   WBC 7.6 07/12/2020   NEUTROABS 4.8 07/12/2020   HGB 11.9 (L) 07/12/2020   HCT 36.5 07/12/2020   MCV 83.1 07/12/2020   PLT 213 07/12/2020      Chemistry      Component Value Date/Time   NA 140 07/12/2020 1113   NA 141 07/09/2017 1312   K 3.7 07/12/2020 1113   K 3.9 07/09/2017 1312   CL 106 07/12/2020 1113   CO2 24 07/12/2020 1113   CO2 27 07/09/2017 1312   BUN 21 07/12/2020 1113   BUN 16.4 07/09/2017 1312   CREATININE 0.78 07/12/2020 1113   CREATININE 0.91 01/04/2019 1351   CREATININE 0.8 07/09/2017 1312      Component Value Date/Time   CALCIUM 9.2 07/12/2020 1113   CALCIUM 9.2 07/09/2017 1312   ALKPHOS 103 07/12/2020 1113   ALKPHOS 182 (H)  07/09/2017 1312   AST 16 07/12/2020 1113   AST 19 01/04/2019 1351   AST 35 (H) 07/09/2017 1312   ALT 13 07/12/2020 1113   ALT 20 01/04/2019 1351   ALT 43 07/09/2017 1312   BILITOT 0.6 07/12/2020 1113   BILITOT 0.3 01/04/2019 1351   BILITOT 0.34 07/09/2017 1312

## 2020-07-13 NOTE — Assessment & Plan Note (Signed)
Her last imaging study did not show signs of cancer progression and she tolerated treatment very well We will continue pembrolizumab indefinitely I will see her every other treatment The next imaging study would be around March 2022 

## 2020-07-13 NOTE — Assessment & Plan Note (Signed)
She has recent mild outbreak with herpes simplex infection I recommend a course of Valtrex

## 2020-07-13 NOTE — Assessment & Plan Note (Signed)
Her recent TSH is intermittently elevated She will continue chronic thyroid replacement therapy We will adjust her thyroid medicine as needed 

## 2020-08-02 ENCOUNTER — Inpatient Hospital Stay: Payer: Medicare Other

## 2020-08-02 ENCOUNTER — Other Ambulatory Visit: Payer: Self-pay

## 2020-08-02 VITALS — BP 164/84 | HR 74 | Temp 97.9°F | Resp 18 | Wt 208.5 lb

## 2020-08-02 DIAGNOSIS — C787 Secondary malignant neoplasm of liver and intrahepatic bile duct: Secondary | ICD-10-CM

## 2020-08-02 DIAGNOSIS — C541 Malignant neoplasm of endometrium: Secondary | ICD-10-CM

## 2020-08-02 DIAGNOSIS — Z5112 Encounter for antineoplastic immunotherapy: Secondary | ICD-10-CM | POA: Diagnosis not present

## 2020-08-02 DIAGNOSIS — E039 Hypothyroidism, unspecified: Secondary | ICD-10-CM

## 2020-08-02 LAB — CBC WITH DIFFERENTIAL/PLATELET
Abs Immature Granulocytes: 0.01 10*3/uL (ref 0.00–0.07)
Basophils Absolute: 0.1 10*3/uL (ref 0.0–0.1)
Basophils Relative: 1 %
Eosinophils Absolute: 0.4 10*3/uL (ref 0.0–0.5)
Eosinophils Relative: 6 %
HCT: 36.9 % (ref 36.0–46.0)
Hemoglobin: 12.3 g/dL (ref 12.0–15.0)
Immature Granulocytes: 0 %
Lymphocytes Relative: 26 %
Lymphs Abs: 1.7 10*3/uL (ref 0.7–4.0)
MCH: 27.3 pg (ref 26.0–34.0)
MCHC: 33.3 g/dL (ref 30.0–36.0)
MCV: 82 fL (ref 80.0–100.0)
Monocytes Absolute: 0.4 10*3/uL (ref 0.1–1.0)
Monocytes Relative: 6 %
Neutro Abs: 4.1 10*3/uL (ref 1.7–7.7)
Neutrophils Relative %: 61 %
Platelets: 216 10*3/uL (ref 150–400)
RBC: 4.5 MIL/uL (ref 3.87–5.11)
RDW: 13.3 % (ref 11.5–15.5)
WBC: 6.7 10*3/uL (ref 4.0–10.5)
nRBC: 0 % (ref 0.0–0.2)

## 2020-08-02 LAB — COMPREHENSIVE METABOLIC PANEL
ALT: 12 U/L (ref 0–44)
AST: 15 U/L (ref 15–41)
Albumin: 3.5 g/dL (ref 3.5–5.0)
Alkaline Phosphatase: 106 U/L (ref 38–126)
Anion gap: 7 (ref 5–15)
BUN: 22 mg/dL (ref 8–23)
CO2: 26 mmol/L (ref 22–32)
Calcium: 9.2 mg/dL (ref 8.9–10.3)
Chloride: 107 mmol/L (ref 98–111)
Creatinine, Ser: 0.79 mg/dL (ref 0.44–1.00)
GFR, Estimated: >60 mL/min (ref >60)
Glucose, Bld: 107 mg/dL — ABNORMAL HIGH (ref 70–99)
Potassium: 3.8 mmol/L (ref 3.5–5.1)
Sodium: 140 mmol/L (ref 135–145)
Total Bilirubin: 0.5 mg/dL (ref 0.3–1.2)
Total Protein: 6.9 g/dL (ref 6.5–8.1)

## 2020-08-02 LAB — TSH: TSH: 7.18 u[IU]/mL — ABNORMAL HIGH (ref 0.308–3.960)

## 2020-08-02 MED ORDER — SODIUM CHLORIDE 0.9% FLUSH
10.0000 mL | Freq: Once | INTRAVENOUS | Status: AC
  Administered 2020-08-02: 10 mL
  Filled 2020-08-02: qty 10

## 2020-08-02 MED ORDER — HEPARIN SOD (PORK) LOCK FLUSH 100 UNIT/ML IV SOLN
500.0000 [IU] | Freq: Once | INTRAVENOUS | Status: AC | PRN
  Administered 2020-08-02: 500 [IU]
  Filled 2020-08-02: qty 5

## 2020-08-02 MED ORDER — SODIUM CHLORIDE 0.9 % IV SOLN
200.0000 mg | Freq: Once | INTRAVENOUS | Status: AC
  Administered 2020-08-02: 200 mg via INTRAVENOUS
  Filled 2020-08-02: qty 8

## 2020-08-02 MED ORDER — SODIUM CHLORIDE 0.9 % IV SOLN
Freq: Once | INTRAVENOUS | Status: AC
  Filled 2020-08-02: qty 250

## 2020-08-02 MED ORDER — SODIUM CHLORIDE 0.9% FLUSH
10.0000 mL | INTRAVENOUS | Status: DC | PRN
  Administered 2020-08-02: 10 mL
  Filled 2020-08-02: qty 10

## 2020-08-02 NOTE — Patient Instructions (Signed)
[facility] Discharge Instructions for Patients Receiving Chemotherapy  Today you received the following chemotherapy agents:  Keytruda.  To help prevent nausea and vomiting after your treatment, we encourage you to take your nausea medication as directed.   If you develop nausea and vomiting that is not controlled by your nausea medication, call the clinic.   BELOW ARE SYMPTOMS THAT SHOULD BE REPORTED IMMEDIATELY:  *FEVER GREATER THAN 100.5 F  *CHILLS WITH OR WITHOUT FEVER  NAUSEA AND VOMITING THAT IS NOT CONTROLLED WITH YOUR NAUSEA MEDICATION  *UNUSUAL SHORTNESS OF BREATH  *UNUSUAL BRUISING OR BLEEDING  TENDERNESS IN MOUTH AND THROAT WITH OR WITHOUT PRESENCE OF ULCERS  *URINARY PROBLEMS  *BOWEL PROBLEMS  UNUSUAL RASH Items with * indicate a potential emergency and should be followed up as soon as possible.  Feel free to call the clinic should you have any questions or concerns. The clinic phone number is [phone].  Please show the CHEMO ALERT CARD at check-in to the Emergency Department and triage nurse.    

## 2020-08-23 ENCOUNTER — Inpatient Hospital Stay (HOSPITAL_BASED_OUTPATIENT_CLINIC_OR_DEPARTMENT_OTHER): Payer: Medicare Other | Admitting: Hematology and Oncology

## 2020-08-23 ENCOUNTER — Inpatient Hospital Stay: Payer: Medicare Other

## 2020-08-23 ENCOUNTER — Telehealth: Payer: Self-pay | Admitting: Hematology and Oncology

## 2020-08-23 ENCOUNTER — Other Ambulatory Visit: Payer: Self-pay

## 2020-08-23 ENCOUNTER — Inpatient Hospital Stay: Payer: Medicare Other | Attending: Hematology and Oncology

## 2020-08-23 ENCOUNTER — Telehealth: Payer: Self-pay

## 2020-08-23 ENCOUNTER — Encounter: Payer: Self-pay | Admitting: Hematology and Oncology

## 2020-08-23 VITALS — BP 149/71 | HR 80 | Temp 97.5°F | Resp 20 | Wt 211.0 lb

## 2020-08-23 DIAGNOSIS — C541 Malignant neoplasm of endometrium: Secondary | ICD-10-CM

## 2020-08-23 DIAGNOSIS — G893 Neoplasm related pain (acute) (chronic): Secondary | ICD-10-CM

## 2020-08-23 DIAGNOSIS — E039 Hypothyroidism, unspecified: Secondary | ICD-10-CM | POA: Insufficient documentation

## 2020-08-23 DIAGNOSIS — C787 Secondary malignant neoplasm of liver and intrahepatic bile duct: Secondary | ICD-10-CM

## 2020-08-23 DIAGNOSIS — Z5112 Encounter for antineoplastic immunotherapy: Secondary | ICD-10-CM | POA: Diagnosis not present

## 2020-08-23 DIAGNOSIS — Z79899 Other long term (current) drug therapy: Secondary | ICD-10-CM | POA: Diagnosis not present

## 2020-08-23 LAB — CBC WITH DIFFERENTIAL/PLATELET
Abs Immature Granulocytes: 0.03 10*3/uL (ref 0.00–0.07)
Basophils Absolute: 0.1 10*3/uL (ref 0.0–0.1)
Basophils Relative: 1 %
Eosinophils Absolute: 0.4 10*3/uL (ref 0.0–0.5)
Eosinophils Relative: 5 %
HCT: 37.3 % (ref 36.0–46.0)
Hemoglobin: 12.2 g/dL (ref 12.0–15.0)
Immature Granulocytes: 0 %
Lymphocytes Relative: 23 %
Lymphs Abs: 1.6 10*3/uL (ref 0.7–4.0)
MCH: 27.2 pg (ref 26.0–34.0)
MCHC: 32.7 g/dL (ref 30.0–36.0)
MCV: 83.3 fL (ref 80.0–100.0)
Monocytes Absolute: 0.5 10*3/uL (ref 0.1–1.0)
Monocytes Relative: 8 %
Neutro Abs: 4.3 10*3/uL (ref 1.7–7.7)
Neutrophils Relative %: 63 %
Platelets: 217 10*3/uL (ref 150–400)
RBC: 4.48 MIL/uL (ref 3.87–5.11)
RDW: 13.8 % (ref 11.5–15.5)
WBC: 6.9 10*3/uL (ref 4.0–10.5)
nRBC: 0 % (ref 0.0–0.2)

## 2020-08-23 LAB — TSH: TSH: 10.204 u[IU]/mL — ABNORMAL HIGH (ref 0.308–3.960)

## 2020-08-23 LAB — COMPREHENSIVE METABOLIC PANEL
ALT: 14 U/L (ref 0–44)
AST: 16 U/L (ref 15–41)
Albumin: 3.6 g/dL (ref 3.5–5.0)
Alkaline Phosphatase: 135 U/L — ABNORMAL HIGH (ref 38–126)
Anion gap: 7 (ref 5–15)
BUN: 22 mg/dL (ref 8–23)
CO2: 28 mmol/L (ref 22–32)
Calcium: 9 mg/dL (ref 8.9–10.3)
Chloride: 105 mmol/L (ref 98–111)
Creatinine, Ser: 0.9 mg/dL (ref 0.44–1.00)
GFR, Estimated: >60 mL/min (ref >60)
Glucose, Bld: 99 mg/dL (ref 70–99)
Potassium: 4.1 mmol/L (ref 3.5–5.1)
Sodium: 140 mmol/L (ref 135–145)
Total Bilirubin: 0.4 mg/dL (ref 0.3–1.2)
Total Protein: 7 g/dL (ref 6.5–8.1)

## 2020-08-23 MED ORDER — OXYCODONE HCL 10 MG PO TABS: 10.0000 mg | ORAL_TABLET | Freq: Two times a day (BID) | ORAL | 0 refills | Status: DC | PRN

## 2020-08-23 MED ORDER — ALTEPLASE 2 MG IJ SOLR
2.0000 mg | Freq: Once | INTRAMUSCULAR | Status: AC
  Administered 2020-08-23: 2 mg
  Filled 2020-08-23: qty 2

## 2020-08-23 MED ORDER — ALTEPLASE 2 MG IJ SOLR
INTRAMUSCULAR | Status: AC
  Filled 2020-08-23: qty 2

## 2020-08-23 MED ORDER — SODIUM CHLORIDE 0.9% FLUSH
10.0000 mL | INTRAVENOUS | Status: DC | PRN
  Administered 2020-08-23: 10 mL
  Filled 2020-08-23: qty 10

## 2020-08-23 MED ORDER — HEPARIN SOD (PORK) LOCK FLUSH 100 UNIT/ML IV SOLN
500.0000 [IU] | Freq: Once | INTRAVENOUS | Status: AC | PRN
  Administered 2020-08-23: 500 [IU]
  Filled 2020-08-23: qty 5

## 2020-08-23 MED ORDER — SODIUM CHLORIDE 0.9 % IV SOLN
200.0000 mg | Freq: Once | INTRAVENOUS | Status: AC
  Administered 2020-08-23: 200 mg via INTRAVENOUS
  Filled 2020-08-23: qty 8

## 2020-08-23 MED ORDER — SODIUM CHLORIDE 0.9% FLUSH
10.0000 mL | Freq: Once | INTRAVENOUS | Status: AC
  Administered 2020-08-23: 10 mL
  Filled 2020-08-23: qty 10

## 2020-08-23 MED ORDER — SODIUM CHLORIDE 0.9 % IV SOLN
Freq: Once | INTRAVENOUS | Status: AC
  Filled 2020-08-23: qty 250

## 2020-08-23 NOTE — Assessment & Plan Note (Signed)
Her pain is occasional flair of pain I refill her prescription today

## 2020-08-23 NOTE — Progress Notes (Signed)
[place] OFFICE PROGRESS NOTE  Patient Care Team: [name], MD as PCP - General (Family Medicine)  ASSESSMENT & PLAN:  Recurrent carcinoma of endometrium [facility]) Her last imaging study did not show signs of cancer progression and she tolerated treatment very well We will continue pembrolizumab indefinitely I plan to order imaging study to be done next month  Acquired hypothyroidism Her recent TSH is intermittently elevated She is already taking a very high dose Given her age, I do not feel strongly and comfortable increasing the dose of her Synthroid I told the patient to be mindful about the timing of his Synthroid and to avoid interaction with food or other medications I plan to recheck her thyroid function again next visit  Cancer associated pain Her pain is occasional flair of pain I refill her prescription today   Orders Placed This Encounter  Procedures  . CT ABDOMEN PELVIS W CONTRAST    Standing Status:   Future    Standing Expiration Date:   08/23/2021    Order Specific Question:   If indicated for the ordered procedure, I authorize the administration of contrast media per Radiology protocol    Answer:   Yes    Order Specific Question:   Preferred imaging location?    Answer:   [facility]    Order Specific Question:   Radiology Contrast Protocol - do NOT remove file path    Answer:   [email or web address]  . TSH    Standing Status:   Standing    Number of Occurrences:   22    Standing Expiration Date:   08/23/2021  . T4, free    Standing Status:   Standing    Number of Occurrences:   22    Standing Expiration Date:   08/23/2021    All questions were answered. The patient knows to call the clinic with any problems, questions or concerns. The total time spent in the appointment was 20 minutes encounter with patients including review of chart and various tests results, discussions about plan of care and  coordination of care plan   Heath Lark, MD 08/23/2020 2:07 PM  INTERVAL HISTORY: Please see below for problem oriented charting. She returns for treatment and follow-up She is doing well except for occasional flare of pain No recent nausea Denies abdominal pain no changes in bowel habits  SUMMARY OF ONCOLOGIC HISTORY: Oncology History Overview Note  MSI stable on June 2017 tissue but high from [facility] study from November 2017  05/15/16: ER is moderately positive (70%). PR is strongly positive (80%).   Recurrent carcinoma of endometrium ([place])  01/02/2016 Pathology Results   Uterus +/- tubes/ovaries, neoplastic, cervix ENDOMETRIAL ADENOCARCINOMA, FIGO GRADE 2 (4.7 CM) THE TUMOR INVADES LESS THAN ONE-HALF OF THE MYOMETRIUM (PT1A) ALL MARGINS OF RESECTION ARE NEGATIVE FOR CARCINOMA LEIOMYOMAS AND ADENOMYOSIS BILATERAL FALLOPIAN TUBES AND OVARIES: HISTOLOGICAL UNREMARKABLE 2. Lymph node, sentinel, biopsy, right obturator ONE BENIGN LYMPH NODE (0/1) 3. Lymph nodes, regional resection, left pelvic FOUR BENIGN LYMPH NODES (0/4)   01/02/2016 Surgery   Dr. Denman George performed robotic-assisted laparoscopic total hysterectomy with bilateral salpingoophorectomy, sentinel lymph node biopsy, lymphadenectomy     05/15/2016 Pathology Results   Vagina, biopsy, mid - ADENOCARCINOMA, SEE COMMENT. Microscopic Comment The morphology along with the patient's history are consistent with recurrent endometrioid adenocarcinoma. The carcinoma has a similar appearance to the primary ([id number]).   05/20/2016 Imaging   Ct scan abdomen showed solid 2.5 cm peritoneal mass in  the mid to anterior left pelvis, suspicious for peritoneal metastasis. 2. Small expansile low-attenuation filling defect in the left external iliac vein, cannot exclude a small deep venous thrombus. Consider correlation with left lower extremity venous Doppler scan. 3. Small simple fluid density structure in the left pelvic sidewall  abutting the left external iliac vessels, favor a small postoperative seroma. 4. No ascites. 5. No lymphadenopathy.  No metastatic disease in the chest. 6. Aortic atherosclerosis.   06/12/2016 PET scan   Intensely hypermetabolic 2.1 cm central pelvic peritoneal mass just to the left of midline, consistent with peritoneal metastatic recurrence. No ascites. 2. No additional hypermetabolic sites of metastatic disease. 3. Diffuse thyroid hypermetabolism without discrete thyroid nodule, favoring thyroiditis. Recommend correlation with serum thyroid function tests.   06/24/2016 - 07/22/2016 Chemotherapy   The patient had weekly cisplatin. She has missed several doses due to infection and pancytopenia   06/24/2016 - 09/04/2016 Radiation Therapy   She completed concurrent radiation therapy Radiation treatment dates:   IMRT : 06/24/16 - 08/01/16 HDR : 08/13/16, 08/20/16, 08/27/16, 09/04/16  Site/dose:   Pelvis treated to 55 Gy in 25 fractions (simultaneous integrated boost technique) Vaginal Cuff treated to 24 Gy in 4 fractions   08/15/2016 - 12/03/2016 Chemotherapy   She received 6 cycles of carboplatin/Taxol   09/27/2016 Imaging   Interval decrease in size of previously described solid peritoneal nodule within the left anterior pelvis. Near complete resolution of previously described low-attenuation structure along the left pelvic sidewall. No evidence for metastatic disease in the chest. Aortic atherosclerosis.   12/30/2016 Imaging   Ct abdomen 1. Solitary left pelvic peritoneal implant is mildly decreased in size in the interval. 2. No new or progressive metastatic disease in the abdomen or pelvis. No ascites. 3. Aortic atherosclerosis.   04/02/2017 Imaging   Left lower quadrant peritoneal implant referenced on previous exam measures 1.7 x 1.3 cm, image 69 of series 2. Increased from 0.8 x 0.8 cm previously peer no new peritoneal implants identified.  Musculoskeletal: The degenerative disc disease  noted within the lumbar spine.  IMPRESSION: 1. Solitary left pelvic peritoneal implant is increased in size in the interval. 2. No new sites of disease.  No ascites. 3. Aortic atherosclerosis   04/11/2017 PET scan   1. The left side of pelvis peritoneal implant has decreased in size and degree of FDG uptake compatible with response to therapy. No new areas of peritoneal disease identified. 2. Persistent diffuse increased uptake within the thyroid gland. Correlation with patient's thyroid function may be helpful.   05/26/2017 Imaging   1. Enlarging tumor implant along the left adnexa, currently 2.6 by 2.4 cm and previously 1.9 by 1.3 cm. No new tumor implant or other specific cause for the patient's pelvic symptoms is currently identified. 2.  Aortic Atherosclerosis (ICD10-I70.0). 3. Lumbar spondylosis and degenerative disc disease causing multilevel impingement.   06/04/2017 - 08/28/2017 Chemotherapy   The patient started letrozole and everolimus   08/26/2017 Imaging   Increased size of mass in the left adnexal region.  Several new small liver metastases in right hepatic lobe   09/08/2017 Imaging   LV EF: 60% -  65%   09/10/2017 Procedure   Technically successful right IJ power-injectable port catheter placement. Ready for routine use   12/04/2017 Imaging   1. Interval increase in size and number of multiple lesions within the liver compatible with hepatic metastatic disease. 2. Interval increase in size mass within the left hemipelvis.   12/15/2017 -  Chemotherapy  The patient had pembrolizumab (KEYTRUDA) 200 mg in sodium chloride 0.9 % 50 mL chemo infusion, 200 mg, Intravenous, Once, 1 of 6 cycles Administration: 200 mg (12/15/2017)  for chemotherapy treatment.    12/21/2017 Imaging   1. Moderate left hydronephrosis and hydroureter, similar compared to most recent CT from May 2019; obstruction appears to be secondary to a left pelvic mass/metastatic focus which has increased in  size since the prior CT. 2. Numerous hepatic metastatic lesions, suspect that some may be increased in size but difficult to further characterize without intravenous contrast. Increased size of left pelvic soft tissue mass/metastatic lesion. Mass abuts and possibly invades the sigmoid colon.    02/20/2018 Imaging   Interval decrease in hepatic metastases.  Decreased mass or lymphadenopathy in the left external iliac chain. Interval resolution of left hydroureteronephrosis.  No new or progressive disease within the abdomen or pelvis.   05/16/2018 Imaging   05/16/2018 CT Abdomen IMPRESSION: 1. Slight interval decrease in size of hepatic metastatic disease. 2. Slight interval decrease in size centrally necrotic mass within the left external iliac region.   09/18/2018 Imaging   1. Today's study demonstrates a mixed response to therapy. Specifically, while the previously noted hepatic metastases appear decreased, the soft tissue mass along the left pelvic sidewall appears slightly larger than the prior study. No new metastatic disease is noted elsewhere in the abdomen or pelvis. 2. Aortic atherosclerosis. 3. Mild cardiomegaly. 4. Additional incidental findings, as above    03/12/2019 Imaging   1. Overall improvement with reduced size of the hepatic metastatic lesions, and reduced size of the irregular soft tissue mass in the left pelvis. 2. Other imaging findings of potential clinical significance: Mild distal esophageal wall thickening, cannot exclude esophagitis. Aortic Atherosclerosis (ICD10-I70.0). Lumbar impingement at L5-S1.     09/09/2019 Imaging   1. Stable size and appearance of the somewhat irregular left adnexal mass, currently 2.8 by 1.9 cm. 2. The scattered hepatic metastatic lesions are stable from 03/12/2019, and represent effectively treated metastatic lesions. 3. Other imaging findings of potential clinical significance: Airway thickening is present, suggesting bronchitis or  reactive airways disease. Lumbar spondylosis and degenerative disc disease causing impingement at L4-5 and L5-S1. Small umbilical hernia contains adipose tissue.   Aortic Atherosclerosis (ICD10-I70.0).   03/07/2020 Imaging   1. Stable appearance of LEFT pelvic mass without new signs of disease. Mass remains closely approximated to the colon, in the vicinity of the distal LEFT ureter and the iliac vessels. 2. Stable signs of hepatic metastatic disease with calcified lesion at the dome of the liver and subtle areas of low attenuation elsewhere which show no change.     REVIEW OF SYSTEMS:   Constitutional: Denies fevers, chills or abnormal weight loss Eyes: Denies blurriness of vision Ears, nose, mouth, throat, and face: Denies mucositis or sore throat Respiratory: Denies cough, dyspnea or wheezes Cardiovascular: Denies palpitation, chest discomfort or lower extremity swelling Gastrointestinal:  Denies nausea, heartburn or change in bowel habits Skin: Denies abnormal skin rashes Lymphatics: Denies new lymphadenopathy or easy bruising Neurological:Denies numbness, tingling or new weaknesses Behavioral/Psych: Mood is stable, no new changes  All other systems were reviewed with the patient and are negative.  I have reviewed the past medical history, past surgical history, social history and family history with the patient and they are unchanged from previous note.  ALLERGIES:  is allergic to latex and lisinopril.  MEDICATIONS:  Current Outpatient Medications  Medication Sig Dispense Refill  . acetaminophen (TYLENOL) 325 MG tablet  Take 650 mg by mouth every 6 (six) hours as needed for mild pain or moderate pain.     Marland Kitchen amoxicillin (AMOXIL) 500 MG capsule Take 2,000 mg by mouth See admin instructions. Prior to dental appointment, takes 4 tables prior to appt    . Biotin w/ Vitamins C & E (HAIR/SKIN/NAILS PO) Take 1 tablet by mouth daily.    . Calcium Carb-Cholecalciferol (CALCIUM 600 + D PO)  Take 2 tablets by mouth daily.    . chlorhexidine (PERIDEX) 0.12 % solution 15 mLs 2 (two) times daily.    . fluticasone (FLONASE) 50 MCG/ACT nasal spray Place 1 spray into both nostrils 2 (two) times daily as needed for allergies.   1  . gabapentin (NEURONTIN) 600 MG tablet TAKE 1 TABLET BY MOUTH TWICE A DAY FOR RESTLESS LEGS AND NEUROPATHY. 60 tablet 11  . ibuprofen (ADVIL,MOTRIN) 200 MG tablet Take 400 mg by mouth every 8 (eight) hours as needed for mild pain.    Marland Kitchen levothyroxine (SYNTHROID) 200 MCG tablet Take 1 tablet (200 mcg total) by mouth daily. 90 tablet 3  . lidocaine-prilocaine (EMLA) cream APPLY TO AFFECTED AREA ONCE 30 g 3  . loratadine (CLARITIN) 10 MG tablet Take 10 mg by mouth daily.    Marland Kitchen LORazepam (ATIVAN) 1 MG tablet Take 1 tablet (1 mg total) by mouth every 8 (eight) hours as needed for anxiety or sleep. 90 tablet 0  . losartan-hydrochlorothiazide (HYZAAR) 100-12.5 MG tablet Take 0.5 tablets by mouth daily. 60 tablet 9  . magnesium oxide (MAG-OX) 400 MG tablet Take 1 tablet by mouth 2 (two) times daily.    . Melatonin 10 MG CAPS Take 10 mg by mouth at bedtime.    . meloxicam (MOBIC) 7.5 MG tablet May take 1 to 2 tablets with food a day as needed for pain.    . niacinamide 500 MG tablet Take 500 mg by mouth 3 (three) times daily with meals.    . ondansetron (ZOFRAN) 8 MG tablet Take 1 tablet (8 mg total) by mouth every 8 (eight) hours as needed for nausea. 60 tablet 1  . Oxycodone HCl 10 MG TABS Take 1 tablet (10 mg total) by mouth 2 (two) times daily as needed. 30 tablet 0  . potassium gluconate 595 (99 K) MG TABS tablet Take by mouth.    . pramipexole (MIRAPEX) 0.5 MG tablet Take 1 mg by mouth at bedtime.     No current facility-administered medications for this visit.   Facility-Administered Medications Ordered in Other Visits  Medication Dose Route Frequency Provider Last Rate Last Admin  . sodium chloride flush (NS) 0.9 % injection 10 mL  10 mL Intracatheter PRN Alvy Bimler,  Khiry Pasquariello, MD   10 mL at 08/23/20 1310    PHYSICAL EXAMINATION: ECOG PERFORMANCE STATUS: 0 - Asymptomatic  Vitals:   08/23/20 1118  BP: (!) 149/71  Pulse: 80  Resp: 20  Temp: (!) 97.5 F (36.4 C)  SpO2: 98%   Filed Weights   08/23/20 1118  Weight: 211 lb (95.7 kg)    GENERAL:alert, no distress and comfortable SKIN: skin color, texture, turgor are normal, no rashes or significant lesions EYES: normal, Conjunctiva are pink and non-injected, sclera clear OROPHARYNX:no exudate, no erythema and lips, buccal mucosa, and tongue normal  NECK: supple, thyroid normal size, non-tender, without nodularity LYMPH:  no palpable lymphadenopathy in the cervical, axillary or inguinal LUNGS: clear to auscultation and percussion with normal breathing effort HEART: regular rate & rhythm and no murmurs  and no lower extremity edema ABDOMEN:abdomen soft, non-tender and normal bowel sounds Musculoskeletal:no cyanosis of digits and no clubbing  NEURO: alert & oriented x 3 with fluent speech, no focal motor/sensory deficits  LABORATORY DATA:  I have reviewed the data as listed    Component Value Date/Time   NA 140 08/23/2020 1112   NA 141 07/09/2017 1312   K 4.1 08/23/2020 1112   K 3.9 07/09/2017 1312   CL 105 08/23/2020 1112   CO2 28 08/23/2020 1112   CO2 27 07/09/2017 1312   GLUCOSE 99 08/23/2020 1112   GLUCOSE 109 07/09/2017 1312   BUN 22 08/23/2020 1112   BUN 16.4 07/09/2017 1312   CREATININE 0.90 08/23/2020 1112   CREATININE 0.91 01/04/2019 1351   CREATININE 0.8 07/09/2017 1312   CALCIUM 9.0 08/23/2020 1112   CALCIUM 9.2 07/09/2017 1312   PROT 7.0 08/23/2020 1112   PROT 7.1 07/09/2017 1312   ALBUMIN 3.6 08/23/2020 1112   ALBUMIN 3.1 (L) 07/09/2017 1312   AST 16 08/23/2020 1112   AST 19 01/04/2019 1351   AST 35 (H) 07/09/2017 1312   ALT 14 08/23/2020 1112   ALT 20 01/04/2019 1351   ALT 43 07/09/2017 1312   ALKPHOS 135 (H) 08/23/2020 1112   ALKPHOS 182 (H) 07/09/2017 1312   BILITOT  0.4 08/23/2020 1112   BILITOT 0.3 01/04/2019 1351   BILITOT 0.34 07/09/2017 1312   GFRNONAA >60 08/23/2020 1112   GFRNONAA >60 01/04/2019 1351   GFRAA >60 03/29/2020 1105   GFRAA >60 01/04/2019 1351    No results found for: SPEP, UPEP  Lab Results  Component Value Date   WBC 6.9 08/23/2020   NEUTROABS 4.3 08/23/2020   HGB 12.2 08/23/2020   HCT 37.3 08/23/2020   MCV 83.3 08/23/2020   PLT 217 08/23/2020      Chemistry      Component Value Date/Time   NA 140 08/23/2020 1112   NA 141 07/09/2017 1312   K 4.1 08/23/2020 1112   K 3.9 07/09/2017 1312   CL 105 08/23/2020 1112   CO2 28 08/23/2020 1112   CO2 27 07/09/2017 1312   BUN 22 08/23/2020 1112   BUN 16.4 07/09/2017 1312   CREATININE 0.90 08/23/2020 1112   CREATININE 0.91 01/04/2019 1351   CREATININE 0.8 07/09/2017 1312      Component Value Date/Time   CALCIUM 9.0 08/23/2020 1112   CALCIUM 9.2 07/09/2017 1312   ALKPHOS 135 (H) 08/23/2020 1112   ALKPHOS 182 (H) 07/09/2017 1312   AST 16 08/23/2020 1112   AST 19 01/04/2019 1351   AST 35 (H) 07/09/2017 1312   ALT 14 08/23/2020 1112   ALT 20 01/04/2019 1351   ALT 43 07/09/2017 1312   BILITOT 0.4 08/23/2020 1112   BILITOT 0.3 01/04/2019 1351   BILITOT 0.34 07/09/2017 1312

## 2020-08-23 NOTE — Patient Instructions (Signed)

## 2020-08-23 NOTE — Assessment & Plan Note (Signed)
Her last imaging study did not show signs of cancer progression and she tolerated treatment very well We will continue pembrolizumab indefinitely I plan to order imaging study to be done next month

## 2020-08-23 NOTE — Telephone Encounter (Signed)
Called and given below message. She verbalized understanding. She thinks she is taking it correctly now. She will try to be consistent with the time she takes Rx and wait 1 hour to eat.

## 2020-08-23 NOTE — Assessment & Plan Note (Signed)
Her recent TSH is intermittently elevated She is already taking a very high dose Given her age, I do not feel strongly and comfortable increasing the dose of her Synthroid I told the patient to be mindful about the timing of his Synthroid and to avoid interaction with food or other medications I plan to recheck her thyroid function again next visit

## 2020-08-23 NOTE — Patient Instructions (Signed)
[facility] Discharge Instructions for Patients Receiving Chemotherapy  Today you received the following chemotherapy agents:  Keytruda.  To help prevent nausea and vomiting after your treatment, we encourage you to take your nausea medication as directed.   If you develop nausea and vomiting that is not controlled by your nausea medication, call the clinic.   BELOW ARE SYMPTOMS THAT SHOULD BE REPORTED IMMEDIATELY:  *FEVER GREATER THAN 100.5 F  *CHILLS WITH OR WITHOUT FEVER  NAUSEA AND VOMITING THAT IS NOT CONTROLLED WITH YOUR NAUSEA MEDICATION  *UNUSUAL SHORTNESS OF BREATH  *UNUSUAL BRUISING OR BLEEDING  TENDERNESS IN MOUTH AND THROAT WITH OR WITHOUT PRESENCE OF ULCERS  *URINARY PROBLEMS  *BOWEL PROBLEMS  UNUSUAL RASH Items with * indicate a potential emergency and should be followed up as soon as possible.  Feel free to call the clinic should you have any questions or concerns. The clinic phone number is [phone].  Please show the CHEMO ALERT CARD at check-in to the Emergency Department and triage nurse.    

## 2020-08-23 NOTE — Telephone Encounter (Signed)
-----   Message from Heath Lark, MD sent at 08/23/2020 12:56 PM EST ----- Regarding: TSH Her TSH is really high Either she is not absorbing it, forgot or taking it wrong The guidelines recommend taking it with empty stomach 30 to 60 mins in the morning before eating or at least 4 hours after last meal I am not comfortable changing her dose Recommend she takes if as above We will recheck and see next lab draw

## 2020-08-23 NOTE — Telephone Encounter (Signed)
Scheduled appts per 2/15 los. Gave pt a print out of appt calendar .

## 2020-09-12 ENCOUNTER — Encounter (HOSPITAL_COMMUNITY): Payer: Self-pay

## 2020-09-12 ENCOUNTER — Ambulatory Visit (HOSPITAL_COMMUNITY)
Admission: RE | Admit: 2020-09-12 | Discharge: 2020-09-12 | Disposition: A | Discharge status: Home / Self Care | Payer: Medicare Other | Source: Ambulatory Visit | Attending: Hematology and Oncology | Admitting: Hematology and Oncology

## 2020-09-12 ENCOUNTER — Other Ambulatory Visit: Payer: Self-pay

## 2020-09-12 DIAGNOSIS — C541 Malignant neoplasm of endometrium: Secondary | ICD-10-CM | POA: Insufficient documentation

## 2020-09-12 MED ORDER — IOHEXOL 300 MG/ML  SOLN
100.0000 mL | Freq: Once | INTRAMUSCULAR | Status: AC | PRN
  Administered 2020-09-12: 100 mL via INTRAVENOUS

## 2020-09-12 MED ORDER — HEPARIN SOD (PORK) LOCK FLUSH 100 UNIT/ML IV SOLN: 500.0000 [IU] | Freq: Once | INTRAVENOUS | Status: AC

## 2020-09-12 MED ORDER — HEPARIN SOD (PORK) LOCK FLUSH 100 UNIT/ML IV SOLN
INTRAVENOUS | Status: AC
  Administered 2020-09-12: 500 [IU] via INTRAVENOUS
  Filled 2020-09-12: qty 5

## 2020-09-13 ENCOUNTER — Telehealth: Payer: Self-pay | Admitting: *Deleted

## 2020-09-13 ENCOUNTER — Inpatient Hospital Stay (HOSPITAL_BASED_OUTPATIENT_CLINIC_OR_DEPARTMENT_OTHER): Payer: Medicare Other | Admitting: Hematology and Oncology

## 2020-09-13 ENCOUNTER — Encounter: Payer: Self-pay | Admitting: Hematology and Oncology

## 2020-09-13 ENCOUNTER — Inpatient Hospital Stay: Payer: Medicare Other

## 2020-09-13 ENCOUNTER — Inpatient Hospital Stay: Payer: Medicare Other | Attending: Hematology and Oncology

## 2020-09-13 ENCOUNTER — Other Ambulatory Visit: Payer: Self-pay

## 2020-09-13 ENCOUNTER — Telehealth: Payer: Self-pay | Admitting: Hematology and Oncology

## 2020-09-13 DIAGNOSIS — C787 Secondary malignant neoplasm of liver and intrahepatic bile duct: Secondary | ICD-10-CM

## 2020-09-13 DIAGNOSIS — C541 Malignant neoplasm of endometrium: Secondary | ICD-10-CM

## 2020-09-13 DIAGNOSIS — Z5112 Encounter for antineoplastic immunotherapy: Secondary | ICD-10-CM | POA: Diagnosis present

## 2020-09-13 DIAGNOSIS — E039 Hypothyroidism, unspecified: Secondary | ICD-10-CM | POA: Insufficient documentation

## 2020-09-13 DIAGNOSIS — Z79899 Other long term (current) drug therapy: Secondary | ICD-10-CM | POA: Insufficient documentation

## 2020-09-13 DIAGNOSIS — E669 Obesity, unspecified: Secondary | ICD-10-CM | POA: Diagnosis not present

## 2020-09-13 DIAGNOSIS — Z923 Personal history of irradiation: Secondary | ICD-10-CM | POA: Diagnosis not present

## 2020-09-13 DIAGNOSIS — Z9221 Personal history of antineoplastic chemotherapy: Secondary | ICD-10-CM | POA: Insufficient documentation

## 2020-09-13 LAB — COMPREHENSIVE METABOLIC PANEL
ALT: 15 U/L (ref 0–44)
AST: 15 U/L (ref 15–41)
Albumin: 3.4 g/dL — ABNORMAL LOW (ref 3.5–5.0)
Alkaline Phosphatase: 122 U/L (ref 38–126)
Anion gap: 7 (ref 5–15)
BUN: 17 mg/dL (ref 8–23)
CO2: 27 mmol/L (ref 22–32)
Calcium: 9 mg/dL (ref 8.9–10.3)
Chloride: 106 mmol/L (ref 98–111)
Creatinine, Ser: 0.76 mg/dL (ref 0.44–1.00)
GFR, Estimated: >60 mL/min (ref >60)
Glucose, Bld: 107 mg/dL — ABNORMAL HIGH (ref 70–99)
Potassium: 3.6 mmol/L (ref 3.5–5.1)
Sodium: 140 mmol/L (ref 135–145)
Total Bilirubin: 0.5 mg/dL (ref 0.3–1.2)
Total Protein: 6.8 g/dL (ref 6.5–8.1)

## 2020-09-13 LAB — CBC WITH DIFFERENTIAL/PLATELET
Abs Immature Granulocytes: 0.01 10*3/uL (ref 0.00–0.07)
Basophils Absolute: 0.1 10*3/uL (ref 0.0–0.1)
Basophils Relative: 1 %
Eosinophils Absolute: 0.3 10*3/uL (ref 0.0–0.5)
Eosinophils Relative: 5 %
HCT: 35.8 % — ABNORMAL LOW (ref 36.0–46.0)
Hemoglobin: 11.8 g/dL — ABNORMAL LOW (ref 12.0–15.0)
Immature Granulocytes: 0 %
Lymphocytes Relative: 23 %
Lymphs Abs: 1.4 10*3/uL (ref 0.7–4.0)
MCH: 27.3 pg (ref 26.0–34.0)
MCHC: 33 g/dL (ref 30.0–36.0)
MCV: 82.7 fL (ref 80.0–100.0)
Monocytes Absolute: 0.4 10*3/uL (ref 0.1–1.0)
Monocytes Relative: 6 %
Neutro Abs: 4 10*3/uL (ref 1.7–7.7)
Neutrophils Relative %: 66 %
Platelets: 207 10*3/uL (ref 150–400)
RBC: 4.33 MIL/uL (ref 3.87–5.11)
RDW: 13.8 % (ref 11.5–15.5)
WBC: 6.2 10*3/uL (ref 4.0–10.5)
nRBC: 0 /100 WBC

## 2020-09-13 LAB — TSH: TSH: 4.812 u[IU]/mL — ABNORMAL HIGH (ref 0.308–3.960)

## 2020-09-13 LAB — T4, FREE: Free T4: 1.19 ng/dL — ABNORMAL HIGH (ref 0.61–1.12)

## 2020-09-13 MED ORDER — SODIUM CHLORIDE 0.9% FLUSH
10.0000 mL | Freq: Once | INTRAVENOUS | Status: AC
  Administered 2020-09-13: 10 mL
  Filled 2020-09-13: qty 10

## 2020-09-13 MED ORDER — SODIUM CHLORIDE 0.9 % IV SOLN
200.0000 mg | Freq: Once | INTRAVENOUS | Status: AC
  Administered 2020-09-13: 200 mg via INTRAVENOUS
  Filled 2020-09-13: qty 8

## 2020-09-13 MED ORDER — SODIUM CHLORIDE 0.9 % IV SOLN
Freq: Once | INTRAVENOUS | Status: AC
  Filled 2020-09-13: qty 250

## 2020-09-13 MED ORDER — HEPARIN SOD (PORK) LOCK FLUSH 100 UNIT/ML IV SOLN
500.0000 [IU] | Freq: Once | INTRAVENOUS | Status: AC | PRN
  Administered 2020-09-13: 500 [IU]
  Filled 2020-09-13: qty 5

## 2020-09-13 MED ORDER — LORAZEPAM 1 MG PO TABS: 1.0000 mg | ORAL_TABLET | Freq: Three times a day (TID) | ORAL | 0 refills | Status: DC | PRN

## 2020-09-13 MED ORDER — SODIUM CHLORIDE 0.9% FLUSH
10.0000 mL | INTRAVENOUS | Status: DC | PRN
  Administered 2020-09-13: 10 mL
  Filled 2020-09-13: qty 10

## 2020-09-13 NOTE — Patient Instructions (Signed)

## 2020-09-13 NOTE — Assessment & Plan Note (Signed)
Her recent TSH is intermittently elevated She is already taking a very high dose Given her age, I do not feel strongly and comfortable increasing the dose of her Synthroid I told the patient to be mindful about the timing of his Synthroid and to avoid interaction with food or other medications I plan to recheck her thyroid function again next visit

## 2020-09-13 NOTE — Telephone Encounter (Signed)
Notified of message below

## 2020-09-13 NOTE — Telephone Encounter (Signed)
Scheduled appts per 3/9 sch msg. Gave pt a print out of AVS.

## 2020-09-13 NOTE — Assessment & Plan Note (Signed)
Her cancer was ER sensitive Obesity is linked with poor outcomes I recommend the patient to start aggressive lifestyle changes and lose weight

## 2020-09-13 NOTE — Assessment & Plan Note (Signed)
Reviewed imaging study extensively There are no new suspicious hepatic lesion Monitor closely for now

## 2020-09-13 NOTE — Progress Notes (Signed)
Michelle Rosario OFFICE PROGRESS NOTE  Patient Care Team: [name], MD as PCP - General (Family Medicine)  ASSESSMENT & PLAN:  Recurrent carcinoma of endometrium [facility]) I have reviewed CT imaging with the patient Overall, Michelle Rosario has no signs of disease recurrence or relapse The plan will be to continue pembrolizumab indefinitely I plan to space out the interval between her imaging studies I will plan to order next CT imaging in September  Metastasis to liver [facility]) Reviewed imaging study extensively There are no new suspicious hepatic lesion Monitor closely for now  Acquired hypothyroidism Her recent TSH is intermittently elevated Michelle Rosario is already taking a very high dose Given her age, I do not feel strongly and comfortable increasing the dose of her Synthroid I told the patient to be mindful about the timing of his Synthroid and to avoid interaction with food or other medications I plan to recheck her thyroid function again next visit  Obesity, Class II, BMI 35-39.9 Her cancer was ER sensitive Obesity is linked with poor outcomes I recommend the patient to start aggressive lifestyle changes and lose weight   No orders of the defined types were placed in this encounter.   All questions were answered. The patient knows to call the clinic with any problems, questions or concerns. The total time spent in the appointment was 30 minutes encounter with patients including review of chart and various tests results, discussions about plan of care and coordination of care plan   Heath Lark, MD 09/13/2020 11:31 AM  INTERVAL HISTORY: Please see below for problem oriented charting. Michelle Rosario returns to review test result and to continue treatment Michelle Rosario feels well No recent side effects of therapy Michelle Rosario has progressive weight gain due to poor dietary choices Michelle Rosario is here with her cousin  SUMMARY OF ONCOLOGIC HISTORY: Oncology History Overview Note  MSI stable on June 2017 tissue but  high from [facility] study from November 2017  05/15/16: ER is moderately positive (70%). PR is strongly positive (80%).   Recurrent carcinoma of endometrium ([place])  01/02/2016 Pathology Results   Uterus +/- tubes/ovaries, neoplastic, cervix ENDOMETRIAL ADENOCARCINOMA, FIGO GRADE 2 (4.7 CM) THE TUMOR INVADES LESS THAN ONE-HALF OF THE MYOMETRIUM (PT1A) ALL MARGINS OF RESECTION ARE NEGATIVE FOR CARCINOMA LEIOMYOMAS AND ADENOMYOSIS BILATERAL FALLOPIAN TUBES AND OVARIES: HISTOLOGICAL UNREMARKABLE 2. Lymph node, sentinel, biopsy, right obturator ONE BENIGN LYMPH NODE (0/1) 3. Lymph nodes, regional resection, left pelvic FOUR BENIGN LYMPH NODES (0/4)   01/02/2016 Surgery   Dr. Denman George performed robotic-assisted laparoscopic total hysterectomy with bilateral salpingoophorectomy, sentinel lymph node biopsy, lymphadenectomy     05/15/2016 Pathology Results   Vagina, biopsy, mid - ADENOCARCINOMA, SEE COMMENT. Microscopic Comment The morphology along with the patient's history are consistent with recurrent endometrioid adenocarcinoma. The carcinoma has a similar appearance to the primary ([id number]).   05/20/2016 Imaging   Ct scan abdomen showed solid 2.5 cm peritoneal mass in the mid to anterior left pelvis, suspicious for peritoneal metastasis. 2. Small expansile low-attenuation filling defect in the left external iliac vein, cannot exclude a small deep venous thrombus. Consider correlation with left lower extremity venous Doppler scan. 3. Small simple fluid density structure in the left pelvic sidewall abutting the left external iliac vessels, favor a small postoperative seroma. 4. No ascites. 5. No lymphadenopathy.  No metastatic disease in the chest. 6. Aortic atherosclerosis.   06/12/2016 PET scan   Intensely hypermetabolic 2.1 cm central pelvic peritoneal mass just to the left of midline, consistent with  peritoneal metastatic recurrence. No ascites. 2. No additional hypermetabolic sites of  metastatic disease. 3. Diffuse thyroid hypermetabolism without discrete thyroid nodule, favoring thyroiditis. Recommend correlation with serum thyroid function tests.   06/24/2016 - 07/22/2016 Chemotherapy   The patient had weekly cisplatin. Michelle Rosario has missed several doses due to infection and pancytopenia   06/24/2016 - 09/04/2016 Radiation Therapy   Michelle Rosario completed concurrent radiation therapy Radiation treatment dates:   IMRT : 06/24/16 - 08/01/16 HDR : 08/13/16, 08/20/16, 08/27/16, 09/04/16  Site/dose:   Pelvis treated to 55 Gy in 25 fractions (simultaneous integrated boost technique) Vaginal Cuff treated to 24 Gy in 4 fractions   08/15/2016 - 12/03/2016 Chemotherapy   Michelle Rosario received 6 cycles of carboplatin/Taxol   09/27/2016 Imaging   Interval decrease in size of previously described solid peritoneal nodule within the left anterior pelvis. Near complete resolution of previously described low-attenuation structure along the left pelvic sidewall. No evidence for metastatic disease in the chest. Aortic atherosclerosis.   12/30/2016 Imaging   Ct abdomen 1. Solitary left pelvic peritoneal implant is mildly decreased in size in the interval. 2. No new or progressive metastatic disease in the abdomen or pelvis. No ascites. 3. Aortic atherosclerosis.   04/02/2017 Imaging   Left lower quadrant peritoneal implant referenced on previous exam measures 1.7 x 1.3 cm, image 69 of series 2. Increased from 0.8 x 0.8 cm previously peer no new peritoneal implants identified.  Musculoskeletal: The degenerative disc disease noted within the lumbar spine.  IMPRESSION: 1. Solitary left pelvic peritoneal implant is increased in size in the interval. 2. No new sites of disease.  No ascites. 3. Aortic atherosclerosis   04/11/2017 PET scan   1. The left side of pelvis peritoneal implant has decreased in size and degree of FDG uptake compatible with response to therapy. No new areas of peritoneal disease  identified. 2. Persistent diffuse increased uptake within the thyroid gland. Correlation with patient's thyroid function may be helpful.   05/26/2017 Imaging   1. Enlarging tumor implant along the left adnexa, currently 2.6 by 2.4 cm and previously 1.9 by 1.3 cm. No new tumor implant or other specific cause for the patient's pelvic symptoms is currently identified. 2.  Aortic Atherosclerosis (ICD10-I70.0). 3. Lumbar spondylosis and degenerative disc disease causing multilevel impingement.   06/04/2017 - 08/28/2017 Chemotherapy   The patient started letrozole and everolimus   08/26/2017 Imaging   Increased size of mass in the left adnexal region.  Several new small liver metastases in right hepatic lobe   09/08/2017 Imaging   LV EF: 60% -  65%   09/10/2017 Procedure   Technically successful right IJ power-injectable port catheter placement. Ready for routine use   12/04/2017 Imaging   1. Interval increase in size and number of multiple lesions within the liver compatible with hepatic metastatic disease. 2. Interval increase in size mass within the left hemipelvis.   12/15/2017 -  Chemotherapy   The patient had pembrolizumab (KEYTRUDA) 200 mg in sodium chloride 0.9 % 50 mL chemo infusion, 200 mg, Intravenous, Once, 1 of 6 cycles Administration: 200 mg (12/15/2017)  for chemotherapy treatment.    12/21/2017 Imaging   1. Moderate left hydronephrosis and hydroureter, similar compared to most recent CT from May 2019; obstruction appears to be secondary to a left pelvic mass/metastatic focus which has increased in size since the prior CT. 2. Numerous hepatic metastatic lesions, suspect that some may be increased in size but difficult to further characterize without intravenous contrast. Increased size  of left pelvic soft tissue mass/metastatic lesion. Mass abuts and possibly invades the sigmoid colon.    02/20/2018 Imaging   Interval decrease in hepatic metastases.  Decreased mass or  lymphadenopathy in the left external iliac chain. Interval resolution of left hydroureteronephrosis.  No new or progressive disease within the abdomen or pelvis.   05/16/2018 Imaging   05/16/2018 CT Abdomen IMPRESSION: 1. Slight interval decrease in size of hepatic metastatic disease. 2. Slight interval decrease in size centrally necrotic mass within the left external iliac region.   09/18/2018 Imaging   1. Today's study demonstrates a mixed response to therapy. Specifically, while the previously noted hepatic metastases appear decreased, the soft tissue mass along the left pelvic sidewall appears slightly larger than the prior study. No new metastatic disease is noted elsewhere in the abdomen or pelvis. 2. Aortic atherosclerosis. 3. Mild cardiomegaly. 4. Additional incidental findings, as above    03/12/2019 Imaging   1. Overall improvement with reduced size of the hepatic metastatic lesions, and reduced size of the irregular soft tissue mass in the left pelvis. 2. Other imaging findings of potential clinical significance: Mild distal esophageal wall thickening, cannot exclude esophagitis. Aortic Atherosclerosis (ICD10-I70.0). Lumbar impingement at L5-S1.     09/09/2019 Imaging   1. Stable size and appearance of the somewhat irregular left adnexal mass, currently 2.8 by 1.9 cm. 2. The scattered hepatic metastatic lesions are stable from 03/12/2019, and represent effectively treated metastatic lesions. 3. Other imaging findings of potential clinical significance: Airway thickening is present, suggesting bronchitis or reactive airways disease. Lumbar spondylosis and degenerative disc disease causing impingement at L4-5 and L5-S1. Small umbilical hernia contains adipose tissue.   Aortic Atherosclerosis (ICD10-I70.0).   03/07/2020 Imaging   1. Stable appearance of LEFT pelvic mass without new signs of disease. Mass remains closely approximated to the colon, in the vicinity of the distal LEFT  ureter and the iliac vessels. 2. Stable signs of hepatic metastatic disease with calcified lesion at the dome of the liver and subtle areas of low attenuation elsewhere which show no change.   09/12/2020 Imaging   1. Stable appearance of the partially calcified LEFT pelvic sidewall soft tissue mass with unchanged involvement of the sigmoid colon, iliac vessels and close proximity to the left ureter. No new signs of disease. 2. Stable signs of the hepatic metastatic disease with calcified lesion at the hepatic dome and subcentimeter area of low attenuation in the right hepatic lobe. 3. No evidence of new or enlarging abdominopelvic metastatic disease. 4. Aortic atherosclerosis.     REVIEW OF SYSTEMS:   Constitutional: Denies fevers, chills or abnormal weight loss Eyes: Denies blurriness of vision Ears, nose, mouth, throat, and face: Denies mucositis or sore throat Respiratory: Denies cough, dyspnea or wheezes Cardiovascular: Denies palpitation, chest discomfort or lower extremity swelling Gastrointestinal:  Denies nausea, heartburn or change in bowel habits Skin: Denies abnormal skin rashes Lymphatics: Denies new lymphadenopathy or easy bruising Neurological:Denies numbness, tingling or new weaknesses Behavioral/Psych: Mood is stable, no new changes  All other systems were reviewed with the patient and are negative.  I have reviewed the past medical history, past surgical history, social history and family history with the patient and they are unchanged from previous note.  ALLERGIES:  is allergic to latex and lisinopril.  MEDICATIONS:  Current Outpatient Medications  Medication Sig Dispense Refill  . acetaminophen (TYLENOL) 325 MG tablet Take 650 mg by mouth every 6 (six) hours as needed for mild pain or moderate pain.     Marland Kitchen  amoxicillin (AMOXIL) 500 MG capsule Take 2,000 mg by mouth See admin instructions. Prior to dental appointment, takes 4 tables prior to appt    . Biotin w/  Vitamins C & E (HAIR/SKIN/NAILS PO) Take 1 tablet by mouth daily.    . Calcium Carb-Cholecalciferol (CALCIUM 600 + D PO) Take 2 tablets by mouth daily.    . fluticasone (FLONASE) 50 MCG/ACT nasal spray Place 1 spray into both nostrils 2 (two) times daily as needed for allergies.   1  . gabapentin (NEURONTIN) 600 MG tablet TAKE 1 TABLET BY MOUTH TWICE A DAY FOR RESTLESS LEGS AND NEUROPATHY. 60 tablet 11  . levothyroxine (SYNTHROID) 200 MCG tablet Take 1 tablet (200 mcg total) by mouth daily. 90 tablet 3  . lidocaine-prilocaine (EMLA) cream APPLY TO AFFECTED AREA ONCE 30 g 3  . loratadine (CLARITIN) 10 MG tablet Take 10 mg by mouth daily.    Marland Kitchen LORazepam (ATIVAN) 1 MG tablet Take 1 tablet (1 mg total) by mouth every 8 (eight) hours as needed for anxiety or sleep. 90 tablet 0  . losartan-hydrochlorothiazide (HYZAAR) 100-12.5 MG tablet Take 0.5 tablets by mouth daily. 60 tablet 9  . magnesium oxide (MAG-OX) 400 MG tablet Take 1 tablet by mouth 2 (two) times daily.    . Melatonin 10 MG CAPS Take 10 mg by mouth at bedtime.    . niacinamide 500 MG tablet Take 500 mg by mouth 3 (three) times daily with meals.    . ondansetron (ZOFRAN) 8 MG tablet Take 1 tablet (8 mg total) by mouth every 8 (eight) hours as needed for nausea. 60 tablet 1  . Oxycodone HCl 10 MG TABS Take 1 tablet (10 mg total) by mouth 2 (two) times daily as needed. 30 tablet 0  . pramipexole (MIRAPEX) 0.5 MG tablet Take 1 mg by mouth at bedtime.     No current facility-administered medications for this visit.    PHYSICAL EXAMINATION: ECOG PERFORMANCE STATUS: 1 - Symptomatic but completely ambulatory  Vitals:   09/13/20 1123  BP: (!) 144/84  Pulse: 78  Resp: 20  Temp: 97.9 F (36.6 C)  SpO2: 97%   Filed Weights   09/13/20 1123  Weight: 211 lb 9.6 oz (96 kg)    GENERAL:alert, no distress and comfortable NEURO: alert & oriented x 3 with fluent speech, no focal motor/sensory deficits  LABORATORY DATA:  I have reviewed the  data as listed    Component Value Date/Time   NA 140 08/23/2020 1112   NA 141 07/09/2017 1312   K 4.1 08/23/2020 1112   K 3.9 07/09/2017 1312   CL 105 08/23/2020 1112   CO2 28 08/23/2020 1112   CO2 27 07/09/2017 1312   GLUCOSE 99 08/23/2020 1112   GLUCOSE 109 07/09/2017 1312   BUN 22 08/23/2020 1112   BUN 16.4 07/09/2017 1312   CREATININE 0.90 08/23/2020 1112   CREATININE 0.91 01/04/2019 1351   CREATININE 0.8 07/09/2017 1312   CALCIUM 9.0 08/23/2020 1112   CALCIUM 9.2 07/09/2017 1312   PROT 7.0 08/23/2020 1112   PROT 7.1 07/09/2017 1312   ALBUMIN 3.6 08/23/2020 1112   ALBUMIN 3.1 (L) 07/09/2017 1312   AST 16 08/23/2020 1112   AST 19 01/04/2019 1351   AST 35 (H) 07/09/2017 1312   ALT 14 08/23/2020 1112   ALT 20 01/04/2019 1351   ALT 43 07/09/2017 1312   ALKPHOS 135 (H) 08/23/2020 1112   ALKPHOS 182 (H) 07/09/2017 1312   BILITOT 0.4 08/23/2020 1112  BILITOT 0.3 01/04/2019 1351   BILITOT 0.34 07/09/2017 1312   GFRNONAA >60 08/23/2020 1112   GFRNONAA >60 01/04/2019 1351   GFRAA >60 03/29/2020 1105   GFRAA >60 01/04/2019 1351    No results found for: SPEP, UPEP  Lab Results  Component Value Date   WBC 6.9 08/23/2020   NEUTROABS 4.3 08/23/2020   HGB 12.2 08/23/2020   HCT 37.3 08/23/2020   MCV 83.3 08/23/2020   PLT 217 08/23/2020      Chemistry      Component Value Date/Time   NA 140 08/23/2020 1112   NA 141 07/09/2017 1312   K 4.1 08/23/2020 1112   K 3.9 07/09/2017 1312   CL 105 08/23/2020 1112   CO2 28 08/23/2020 1112   CO2 27 07/09/2017 1312   BUN 22 08/23/2020 1112   BUN 16.4 07/09/2017 1312   CREATININE 0.90 08/23/2020 1112   CREATININE 0.91 01/04/2019 1351   CREATININE 0.8 07/09/2017 1312      Component Value Date/Time   CALCIUM 9.0 08/23/2020 1112   CALCIUM 9.2 07/09/2017 1312   ALKPHOS 135 (H) 08/23/2020 1112   ALKPHOS 182 (H) 07/09/2017 1312   AST 16 08/23/2020 1112   AST 19 01/04/2019 1351   AST 35 (H) 07/09/2017 1312   ALT 14 08/23/2020  1112   ALT 20 01/04/2019 1351   ALT 43 07/09/2017 1312   BILITOT 0.4 08/23/2020 1112   BILITOT 0.3 01/04/2019 1351   BILITOT 0.34 07/09/2017 1312       RADIOGRAPHIC STUDIES: I have reviewed imaging with her and her family I have personally reviewed the radiological images as listed and agreed with the findings in the report. CT ABDOMEN PELVIS W CONTRAST  Result Date: 09/13/2020 CLINICAL DATA:  Restaging of endometrial cancer. EXAM: CT ABDOMEN AND PELVIS WITH CONTRAST TECHNIQUE: Multidetector CT imaging of the abdomen and pelvis was performed using the standard protocol following bolus administration of intravenous contrast. CONTRAST:  153m OMNIPAQUE IOHEXOL 300 MG/ML  SOLN COMPARISON:  Multiple priors including most recent CT abdomen pelvis March 07, 2020. FINDINGS: Lower chest: Bibasilar scarring, similar prior. Normal size heart. No pericardial effusion. Small hiatal hernia. Hepatobiliary: Unchanged size of the hypodense lesion within the right hepatic lobe measuring 9 mm on image 13/2. Small calcified region in the right hepatic dome is also similar in size measuring 7 mm on image 7/2, unchanged. No new suspicious hepatic lesion. Gallbladder is unremarkable. No biliary ductal dilation. Pancreas: Unremarkable. Spleen: Unremarkable. Adrenals/Urinary Tract: Bilateral adrenal glands are unremarkable. No hydronephrosis. No suspicious renal lesions. Symmetric enhancement and excretion of bilateral kidneys. No suspicious filling defect visualized within the opacified portions of the collecting system and ureter on delayed imaging. Urinary bladder is unremarkable for degree of distension. Stomach/Bowel: Small hiatal hernia, otherwise stomach is grossly unremarkable for degree of distension. Normal positioning of the duodenum/ligament of [place]. No suspicious small bowel wall thickening or dilation. The appendix and terminal ileum appear unremarkable. Similar prior there is tethering of the sigmoid colon to  soft tissue density in the left pelvis. Vascular/Lymphatic: Aortic atherosclerosis. No abdominal aortic aneurysm. Similar size of the partially calcified LEFT pelvic sidewall soft tissue or lymph node measuring 2.8 by 1.7 cm on image 67/2, with unchanged involvement of the sigmoid colon, iliac vessels and close proximity to the left ureter. Reproductive: Status post hysterectomy. No adnexal masses. Other: No abdominopelvic ascites. Small fat containing periumbilical hernia. Musculoskeletal: Multilevel degenerative changes spine. No acute osseous abnormality. IMPRESSION: 1. Stable appearance of  the partially calcified LEFT pelvic sidewall soft tissue mass with unchanged involvement of the sigmoid colon, iliac vessels and close proximity to the left ureter. No new signs of disease. 2. Stable signs of the hepatic metastatic disease with calcified lesion at the hepatic dome and subcentimeter area of low attenuation in the right hepatic lobe. 3. No evidence of new or enlarging abdominopelvic metastatic disease. 4. Aortic atherosclerosis. Aortic Atherosclerosis (ICD10-I70.0). Electronically Signed   By: Dahlia Bailiff MD   On: 09/13/2020 10:55

## 2020-09-13 NOTE — Telephone Encounter (Signed)
-----   Message from Heath Lark, MD sent at 09/13/2020 12:41 PM EST ----- Regarding: TSH is better Her TSH is better No need to change dose of synthroid Please call her and let her know

## 2020-09-13 NOTE — Assessment & Plan Note (Signed)
I have reviewed CT imaging with the patient Overall, she has no signs of disease recurrence or relapse The plan will be to continue pembrolizumab indefinitely I plan to space out the interval between her imaging studies I will plan to order next CT imaging in September

## 2020-09-13 NOTE — Patient Instructions (Signed)
[facility] Discharge Instructions for Patients Receiving Chemotherapy  Today you received the following chemotherapy agents: pembrolizumab.  To help prevent nausea and vomiting after your treatment, we encourage you to take your nausea medication as directed.   If you develop nausea and vomiting that is not controlled by your nausea medication, call the clinic.   BELOW ARE SYMPTOMS THAT SHOULD BE REPORTED IMMEDIATELY:  *FEVER GREATER THAN 100.5 F  *CHILLS WITH OR WITHOUT FEVER  NAUSEA AND VOMITING THAT IS NOT CONTROLLED WITH YOUR NAUSEA MEDICATION  *UNUSUAL SHORTNESS OF BREATH  *UNUSUAL BRUISING OR BLEEDING  TENDERNESS IN MOUTH AND THROAT WITH OR WITHOUT PRESENCE OF ULCERS  *URINARY PROBLEMS  *BOWEL PROBLEMS  UNUSUAL RASH Items with * indicate a potential emergency and should be followed up as soon as possible.  Feel free to call the clinic should you have any questions or concerns. The clinic phone number is [phone].  Please show the CHEMO ALERT CARD at check-in to the Emergency Department and triage nurse.   

## 2020-10-05 ENCOUNTER — Inpatient Hospital Stay: Payer: Medicare Other

## 2020-10-05 ENCOUNTER — Other Ambulatory Visit: Payer: Self-pay

## 2020-10-05 VITALS — BP 152/84 | HR 70 | Temp 98.2°F | Resp 18

## 2020-10-05 DIAGNOSIS — Z5112 Encounter for antineoplastic immunotherapy: Secondary | ICD-10-CM | POA: Diagnosis not present

## 2020-10-05 DIAGNOSIS — C541 Malignant neoplasm of endometrium: Secondary | ICD-10-CM

## 2020-10-05 DIAGNOSIS — C787 Secondary malignant neoplasm of liver and intrahepatic bile duct: Secondary | ICD-10-CM

## 2020-10-05 DIAGNOSIS — E039 Hypothyroidism, unspecified: Secondary | ICD-10-CM

## 2020-10-05 LAB — TSH: TSH: 1.873 u[IU]/mL (ref 0.308–3.960)

## 2020-10-05 LAB — COMPREHENSIVE METABOLIC PANEL
ALT: 17 U/L (ref 0–44)
AST: 17 U/L (ref 15–41)
Albumin: 3.8 g/dL (ref 3.5–5.0)
Alkaline Phosphatase: 125 U/L (ref 38–126)
Anion gap: 11 (ref 5–15)
BUN: 15 mg/dL (ref 8–23)
CO2: 27 mmol/L (ref 22–32)
Calcium: 9.2 mg/dL (ref 8.9–10.3)
Chloride: 102 mmol/L (ref 98–111)
Creatinine, Ser: 0.84 mg/dL (ref 0.44–1.00)
GFR, Estimated: >60 mL/min (ref >60)
Glucose, Bld: 95 mg/dL (ref 70–99)
Potassium: 3.3 mmol/L — ABNORMAL LOW (ref 3.5–5.1)
Sodium: 140 mmol/L (ref 135–145)
Total Bilirubin: 0.4 mg/dL (ref 0.3–1.2)
Total Protein: 7.2 g/dL (ref 6.5–8.1)

## 2020-10-05 LAB — CBC WITH DIFFERENTIAL/PLATELET
Abs Immature Granulocytes: 0.02 10*3/uL (ref 0.00–0.07)
Basophils Absolute: 0.1 10*3/uL (ref 0.0–0.1)
Basophils Relative: 1 %
Eosinophils Absolute: 0.2 10*3/uL (ref 0.0–0.5)
Eosinophils Relative: 2 %
HCT: 35.9 % — ABNORMAL LOW (ref 36.0–46.0)
Hemoglobin: 11.8 g/dL — ABNORMAL LOW (ref 12.0–15.0)
Immature Granulocytes: 0 %
Lymphocytes Relative: 28 %
Lymphs Abs: 2.1 10*3/uL (ref 0.7–4.0)
MCH: 27.1 pg (ref 26.0–34.0)
MCHC: 32.9 g/dL (ref 30.0–36.0)
MCV: 82.3 fL (ref 80.0–100.0)
Monocytes Absolute: 0.5 10*3/uL (ref 0.1–1.0)
Monocytes Relative: 7 %
Neutro Abs: 4.6 10*3/uL (ref 1.7–7.7)
Neutrophils Relative %: 62 %
Platelets: 212 10*3/uL (ref 150–400)
RBC: 4.36 MIL/uL (ref 3.87–5.11)
RDW: 13.6 % (ref 11.5–15.5)
WBC: 7.5 10*3/uL (ref 4.0–10.5)
nRBC: 0 % (ref 0.0–0.2)

## 2020-10-05 LAB — T4, FREE: Free T4: 1.65 ng/dL — ABNORMAL HIGH (ref 0.61–1.12)

## 2020-10-05 MED ORDER — SODIUM CHLORIDE 0.9 % IV SOLN
Freq: Once | INTRAVENOUS | Status: AC
Start: 2020-10-05 — End: 2020-10-05
  Filled 2020-10-05: qty 250

## 2020-10-05 MED ORDER — SODIUM CHLORIDE 0.9 % IV SOLN
200.0000 mg | Freq: Once | INTRAVENOUS | Status: AC
  Administered 2020-10-05: 200 mg via INTRAVENOUS
  Filled 2020-10-05: qty 8

## 2020-10-05 MED ORDER — SODIUM CHLORIDE 0.9% FLUSH
10.0000 mL | INTRAVENOUS | Status: DC | PRN
  Administered 2020-10-05: 10 mL
  Filled 2020-10-05: qty 10

## 2020-10-05 MED ORDER — SODIUM CHLORIDE 0.9% FLUSH
10.0000 mL | Freq: Once | INTRAVENOUS | Status: AC
Start: 2020-10-05 — End: 2020-10-05
  Administered 2020-10-05: 10 mL
  Filled 2020-10-05: qty 10

## 2020-10-05 MED ORDER — HEPARIN SOD (PORK) LOCK FLUSH 100 UNIT/ML IV SOLN
500.0000 [IU] | Freq: Once | INTRAVENOUS | Status: AC | PRN
  Administered 2020-10-05: 500 [IU]
  Filled 2020-10-05: qty 5

## 2020-10-05 NOTE — Patient Instructions (Signed)

## 2020-10-26 ENCOUNTER — Other Ambulatory Visit: Payer: Self-pay

## 2020-10-26 ENCOUNTER — Encounter: Payer: Self-pay | Admitting: Hematology and Oncology

## 2020-10-26 ENCOUNTER — Inpatient Hospital Stay: Payer: Medicare Other

## 2020-10-26 ENCOUNTER — Inpatient Hospital Stay (HOSPITAL_BASED_OUTPATIENT_CLINIC_OR_DEPARTMENT_OTHER): Payer: Medicare Other | Admitting: Hematology and Oncology

## 2020-10-26 ENCOUNTER — Inpatient Hospital Stay: Payer: Medicare Other | Attending: Hematology and Oncology

## 2020-10-26 ENCOUNTER — Other Ambulatory Visit: Payer: Medicare Other

## 2020-10-26 DIAGNOSIS — E039 Hypothyroidism, unspecified: Secondary | ICD-10-CM

## 2020-10-26 DIAGNOSIS — G4701 Insomnia due to medical condition: Secondary | ICD-10-CM | POA: Diagnosis not present

## 2020-10-26 DIAGNOSIS — Z5112 Encounter for antineoplastic immunotherapy: Secondary | ICD-10-CM | POA: Diagnosis present

## 2020-10-26 DIAGNOSIS — E663 Overweight: Secondary | ICD-10-CM

## 2020-10-26 DIAGNOSIS — G4700 Insomnia, unspecified: Secondary | ICD-10-CM | POA: Diagnosis not present

## 2020-10-26 DIAGNOSIS — Z79899 Other long term (current) drug therapy: Secondary | ICD-10-CM | POA: Diagnosis not present

## 2020-10-26 DIAGNOSIS — C787 Secondary malignant neoplasm of liver and intrahepatic bile duct: Secondary | ICD-10-CM | POA: Diagnosis present

## 2020-10-26 DIAGNOSIS — C541 Malignant neoplasm of endometrium: Secondary | ICD-10-CM

## 2020-10-26 LAB — COMPREHENSIVE METABOLIC PANEL
ALT: 12 U/L (ref 0–44)
AST: 18 U/L (ref 15–41)
Albumin: 3.7 g/dL (ref 3.5–5.0)
Alkaline Phosphatase: 118 U/L (ref 38–126)
Anion gap: 10 (ref 5–15)
BUN: 21 mg/dL (ref 8–23)
CO2: 26 mmol/L (ref 22–32)
Calcium: 9.3 mg/dL (ref 8.9–10.3)
Chloride: 103 mmol/L (ref 98–111)
Creatinine, Ser: 0.94 mg/dL (ref 0.44–1.00)
GFR, Estimated: >60 mL/min (ref >60)
Glucose, Bld: 87 mg/dL (ref 70–99)
Potassium: 3.9 mmol/L (ref 3.5–5.1)
Sodium: 139 mmol/L (ref 135–145)
Total Bilirubin: 0.6 mg/dL (ref 0.3–1.2)
Total Protein: 6.9 g/dL (ref 6.5–8.1)

## 2020-10-26 LAB — CBC WITH DIFFERENTIAL/PLATELET
Abs Immature Granulocytes: 0.01 10*3/uL (ref 0.00–0.07)
Basophils Absolute: 0.1 10*3/uL (ref 0.0–0.1)
Basophils Relative: 1 %
Eosinophils Absolute: 1.1 10*3/uL — ABNORMAL HIGH (ref 0.0–0.5)
Eosinophils Relative: 14 %
HCT: 36 % (ref 36.0–46.0)
Hemoglobin: 11.9 g/dL — ABNORMAL LOW (ref 12.0–15.0)
Immature Granulocytes: 0 %
Lymphocytes Relative: 24 %
Lymphs Abs: 1.8 10*3/uL (ref 0.7–4.0)
MCH: 27.5 pg (ref 26.0–34.0)
MCHC: 33.1 g/dL (ref 30.0–36.0)
MCV: 83.1 fL (ref 80.0–100.0)
Monocytes Absolute: 0.5 10*3/uL (ref 0.1–1.0)
Monocytes Relative: 6 %
Neutro Abs: 4.2 10*3/uL (ref 1.7–7.7)
Neutrophils Relative %: 55 %
Platelets: 215 10*3/uL (ref 150–400)
RBC: 4.33 MIL/uL (ref 3.87–5.11)
RDW: 13.8 % (ref 11.5–15.5)
WBC: 7.6 10*3/uL (ref 4.0–10.5)
nRBC: 0 % (ref 0.0–0.2)

## 2020-10-26 LAB — TSH: TSH: 4.959 u[IU]/mL — ABNORMAL HIGH (ref 0.308–3.960)

## 2020-10-26 LAB — T4, FREE: Free T4: 1.37 ng/dL — ABNORMAL HIGH (ref 0.61–1.12)

## 2020-10-26 MED ORDER — SODIUM CHLORIDE 0.9% FLUSH
10.0000 mL | INTRAVENOUS | Status: DC | PRN
  Administered 2020-10-26: 10 mL
  Filled 2020-10-26: qty 10

## 2020-10-26 MED ORDER — SODIUM CHLORIDE 0.9 % IV SOLN
200.0000 mg | Freq: Once | INTRAVENOUS | Status: AC
  Administered 2020-10-26: 200 mg via INTRAVENOUS
  Filled 2020-10-26: qty 8

## 2020-10-26 MED ORDER — SODIUM CHLORIDE 0.9 % IV SOLN
Freq: Once | INTRAVENOUS | Status: AC
  Filled 2020-10-26: qty 250

## 2020-10-26 MED ORDER — HEPARIN SOD (PORK) LOCK FLUSH 100 UNIT/ML IV SOLN
500.0000 [IU] | Freq: Once | INTRAVENOUS | Status: AC | PRN
  Administered 2020-10-26: 500 [IU]
  Filled 2020-10-26: qty 5

## 2020-10-26 MED ORDER — SODIUM CHLORIDE 0.9% FLUSH
10.0000 mL | Freq: Once | INTRAVENOUS | Status: AC
  Administered 2020-10-26: 10 mL
  Filled 2020-10-26: qty 10

## 2020-10-26 NOTE — Assessment & Plan Note (Signed)
I reminded patient she can take melatonin with her sleep aid

## 2020-10-26 NOTE — Assessment & Plan Note (Signed)
Her last CT imaging showed no signs of disease recurrence or relapse The plan will be to continue pembrolizumab indefinitely I plan to space out the interval between her imaging studies I will plan to order next CT imaging in September

## 2020-10-26 NOTE — Assessment & Plan Note (Signed)
She was able to successfully lost some weight since last time I saw her I encouraged her to continue on aggressive lifestyle changes

## 2020-10-26 NOTE — Progress Notes (Signed)
[facility] OFFICE PROGRESS NOTE  Patient Care Team: [name], MD as PCP - General (Family Medicine)  ASSESSMENT & PLAN:  Recurrent carcinoma of endometrium [facility]) Her last CT imaging showed no signs of disease recurrence or relapse The plan will be to continue pembrolizumab indefinitely I plan to space out the interval between her imaging studies I will plan to order next CT imaging in September  Acquired hypothyroidism Her recent TSH is intermittently elevated We will monitor closely while she is on treatment  Insomnia disorder I reminded patient she can take melatonin with her sleep aid  Overweight (BMI 25.0-29.9) She was able to successfully lost some weight since last time I saw her I encouraged her to continue on aggressive lifestyle changes   No orders of the defined types were placed in this encounter.   All questions were answered. The patient knows to call the clinic with any problems, questions or concerns. The total time spent in the appointment was 20 minutes encounter with patients including review of chart and various tests results, discussions about plan of care and coordination of care plan   Heath Lark, MD 10/26/2020 1:23 PM  INTERVAL HISTORY: Please see below for problem oriented charting. She returns for treatment and follow-up She is doing well No side effects of treatment so far She complained of intermittent insomnia Denies pelvic pain She is able to lose some weight due to aggressive dietary modifications  SUMMARY OF ONCOLOGIC HISTORY: Oncology History Overview Note  MSI stable on June 2017 tissue but high from [facility] study from November 2017  05/15/16: ER is moderately positive (70%). PR is strongly positive (80%).   Recurrent carcinoma of endometrium ([place])  01/02/2016 Pathology Results   Uterus +/- tubes/ovaries, neoplastic, cervix ENDOMETRIAL ADENOCARCINOMA, FIGO GRADE 2 (4.7 CM) THE TUMOR INVADES LESS THAN  ONE-HALF OF THE MYOMETRIUM (PT1A) ALL MARGINS OF RESECTION ARE NEGATIVE FOR CARCINOMA LEIOMYOMAS AND ADENOMYOSIS BILATERAL FALLOPIAN TUBES AND OVARIES: HISTOLOGICAL UNREMARKABLE 2. Lymph node, sentinel, biopsy, right obturator ONE BENIGN LYMPH NODE (0/1) 3. Lymph nodes, regional resection, left pelvic FOUR BENIGN LYMPH NODES (0/4)   01/02/2016 Surgery   Dr. Denman George performed robotic-assisted laparoscopic total hysterectomy with bilateral salpingoophorectomy, sentinel lymph node biopsy, lymphadenectomy     05/15/2016 Pathology Results   Vagina, biopsy, mid - ADENOCARCINOMA, SEE COMMENT. Microscopic Comment The morphology along with the patient's history are consistent with recurrent endometrioid adenocarcinoma. The carcinoma has a similar appearance to the primary ([id number]).   05/20/2016 Imaging   Ct scan abdomen showed solid 2.5 cm peritoneal mass in the mid to anterior left pelvis, suspicious for peritoneal metastasis. 2. Small expansile low-attenuation filling defect in the left external iliac vein, cannot exclude a small deep venous thrombus. Consider correlation with left lower extremity venous Doppler scan. 3. Small simple fluid density structure in the left pelvic sidewall abutting the left external iliac vessels, favor a small postoperative seroma. 4. No ascites. 5. No lymphadenopathy.  No metastatic disease in the chest. 6. Aortic atherosclerosis.   06/12/2016 PET scan   Intensely hypermetabolic 2.1 cm central pelvic peritoneal mass just to the left of midline, consistent with peritoneal metastatic recurrence. No ascites. 2. No additional hypermetabolic sites of metastatic disease. 3. Diffuse thyroid hypermetabolism without discrete thyroid nodule, favoring thyroiditis. Recommend correlation with serum thyroid function tests.   06/24/2016 - 07/22/2016 Chemotherapy   The patient had weekly cisplatin. She has missed several doses due to infection and pancytopenia   06/24/2016 -  09/04/2016  Radiation Therapy   She completed concurrent radiation therapy Radiation treatment dates:   IMRT : 06/24/16 - 08/01/16 HDR : 08/13/16, 08/20/16, 08/27/16, 09/04/16  Site/dose:   Pelvis treated to 55 Gy in 25 fractions (simultaneous integrated boost technique) Vaginal Cuff treated to 24 Gy in 4 fractions   08/15/2016 - 12/03/2016 Chemotherapy   She received 6 cycles of carboplatin/Taxol   09/27/2016 Imaging   Interval decrease in size of previously described solid peritoneal nodule within the left anterior pelvis. Near complete resolution of previously described low-attenuation structure along the left pelvic sidewall. No evidence for metastatic disease in the chest. Aortic atherosclerosis.   12/30/2016 Imaging   Ct abdomen 1. Solitary left pelvic peritoneal implant is mildly decreased in size in the interval. 2. No new or progressive metastatic disease in the abdomen or pelvis. No ascites. 3. Aortic atherosclerosis.   04/02/2017 Imaging   Left lower quadrant peritoneal implant referenced on previous exam measures 1.7 x 1.3 cm, image 69 of series 2. Increased from 0.8 x 0.8 cm previously peer no new peritoneal implants identified.  Musculoskeletal: The degenerative disc disease noted within the lumbar spine.  IMPRESSION: 1. Solitary left pelvic peritoneal implant is increased in size in the interval. 2. No new sites of disease.  No ascites. 3. Aortic atherosclerosis   04/11/2017 PET scan   1. The left side of pelvis peritoneal implant has decreased in size and degree of FDG uptake compatible with response to therapy. No new areas of peritoneal disease identified. 2. Persistent diffuse increased uptake within the thyroid gland. Correlation with patient's thyroid function may be helpful.   05/26/2017 Imaging   1. Enlarging tumor implant along the left adnexa, currently 2.6 by 2.4 cm and previously 1.9 by 1.3 cm. No new tumor implant or other specific cause for the patient's pelvic  symptoms is currently identified. 2.  Aortic Atherosclerosis (ICD10-I70.0). 3. Lumbar spondylosis and degenerative disc disease causing multilevel impingement.   06/04/2017 - 08/28/2017 Chemotherapy   The patient started letrozole and everolimus   08/26/2017 Imaging   Increased size of mass in the left adnexal region.  Several new small liver metastases in right hepatic lobe   09/08/2017 Imaging   LV EF: 60% -  65%   09/10/2017 Procedure   Technically successful right IJ power-injectable port catheter placement. Ready for routine use   12/04/2017 Imaging   1. Interval increase in size and number of multiple lesions within the liver compatible with hepatic metastatic disease. 2. Interval increase in size mass within the left hemipelvis.   12/15/2017 -  Chemotherapy   The patient had pembrolizumab (KEYTRUDA) 200 mg in sodium chloride 0.9 % 50 mL chemo infusion, 200 mg, Intravenous, Once, 1 of 6 cycles Administration: 200 mg (12/15/2017)  for chemotherapy treatment.    12/21/2017 Imaging   1. Moderate left hydronephrosis and hydroureter, similar compared to most recent CT from May 2019; obstruction appears to be secondary to a left pelvic mass/metastatic focus which has increased in size since the prior CT. 2. Numerous hepatic metastatic lesions, suspect that some may be increased in size but difficult to further characterize without intravenous contrast. Increased size of left pelvic soft tissue mass/metastatic lesion. Mass abuts and possibly invades the sigmoid colon.    02/20/2018 Imaging   Interval decrease in hepatic metastases.  Decreased mass or lymphadenopathy in the left external iliac chain. Interval resolution of left hydroureteronephrosis.  No new or progressive disease within the abdomen or pelvis.   05/16/2018 Imaging  05/16/2018 CT Abdomen IMPRESSION: 1. Slight interval decrease in size of hepatic metastatic disease. 2. Slight interval decrease in size centrally  necrotic mass within the left external iliac region.   09/18/2018 Imaging   1. Today's study demonstrates a mixed response to therapy. Specifically, while the previously noted hepatic metastases appear decreased, the soft tissue mass along the left pelvic sidewall appears slightly larger than the prior study. No new metastatic disease is noted elsewhere in the abdomen or pelvis. 2. Aortic atherosclerosis. 3. Mild cardiomegaly. 4. Additional incidental findings, as above    03/12/2019 Imaging   1. Overall improvement with reduced size of the hepatic metastatic lesions, and reduced size of the irregular soft tissue mass in the left pelvis. 2. Other imaging findings of potential clinical significance: Mild distal esophageal wall thickening, cannot exclude esophagitis. Aortic Atherosclerosis (ICD10-I70.0). Lumbar impingement at L5-S1.     09/09/2019 Imaging   1. Stable size and appearance of the somewhat irregular left adnexal mass, currently 2.8 by 1.9 cm. 2. The scattered hepatic metastatic lesions are stable from 03/12/2019, and represent effectively treated metastatic lesions. 3. Other imaging findings of potential clinical significance: Airway thickening is present, suggesting bronchitis or reactive airways disease. Lumbar spondylosis and degenerative disc disease causing impingement at L4-5 and L5-S1. Small umbilical hernia contains adipose tissue.   Aortic Atherosclerosis (ICD10-I70.0).   03/07/2020 Imaging   1. Stable appearance of LEFT pelvic mass without new signs of disease. Mass remains closely approximated to the colon, in the vicinity of the distal LEFT ureter and the iliac vessels. 2. Stable signs of hepatic metastatic disease with calcified lesion at the dome of the liver and subtle areas of low attenuation elsewhere which show no change.   09/12/2020 Imaging   1. Stable appearance of the partially calcified LEFT pelvic sidewall soft tissue mass with unchanged involvement of the  sigmoid colon, iliac vessels and close proximity to the left ureter. No new signs of disease. 2. Stable signs of the hepatic metastatic disease with calcified lesion at the hepatic dome and subcentimeter area of low attenuation in the right hepatic lobe. 3. No evidence of new or enlarging abdominopelvic metastatic disease. 4. Aortic atherosclerosis.     REVIEW OF SYSTEMS:   Constitutional: Denies fevers, chills or abnormal weight loss Eyes: Denies blurriness of vision Ears, nose, mouth, throat, and face: Denies mucositis or sore throat Respiratory: Denies cough, dyspnea or wheezes Cardiovascular: Denies palpitation, chest discomfort or lower extremity swelling Gastrointestinal:  Denies nausea, heartburn or change in bowel habits Skin: Denies abnormal skin rashes Lymphatics: Denies new lymphadenopathy or easy bruising Neurological:Denies numbness, tingling or new weaknesses Behavioral/Psych: Mood is stable, no new changes  All other systems were reviewed with the patient and are negative.  I have reviewed the past medical history, past surgical history, social history and family history with the patient and they are unchanged from previous note.  ALLERGIES:  is allergic to latex and lisinopril.  MEDICATIONS:  Current Outpatient Medications  Medication Sig Dispense Refill  . acetaminophen (TYLENOL) 325 MG tablet Take 650 mg by mouth every 6 (six) hours as needed for mild pain or moderate pain.     Marland Kitchen amoxicillin (AMOXIL) 500 MG capsule Take 2,000 mg by mouth See admin instructions. Prior to dental appointment, takes 4 tables prior to appt    . Biotin w/ Vitamins C & E (HAIR/SKIN/NAILS PO) Take 1 tablet by mouth daily.    . Calcium Carb-Cholecalciferol (CALCIUM 600 + D PO) Take 2 tablets by  mouth daily.    . fluticasone (FLONASE) 50 MCG/ACT nasal spray Place 1 spray into both nostrils 2 (two) times daily as needed for allergies.   1  . gabapentin (NEURONTIN) 600 MG tablet TAKE 1 TABLET BY  MOUTH TWICE A DAY FOR RESTLESS LEGS AND NEUROPATHY. 60 tablet 11  . levothyroxine (SYNTHROID) 200 MCG tablet Take 1 tablet (200 mcg total) by mouth daily. 90 tablet 3  . lidocaine-prilocaine (EMLA) cream APPLY TO AFFECTED AREA ONCE 30 g 3  . loratadine (CLARITIN) 10 MG tablet Take 10 mg by mouth daily.    Marland Kitchen LORazepam (ATIVAN) 1 MG tablet Take 1 tablet (1 mg total) by mouth every 8 (eight) hours as needed for anxiety or sleep. 90 tablet 0  . losartan-hydrochlorothiazide (HYZAAR) 100-12.5 MG tablet Take 0.5 tablets by mouth daily. 60 tablet 9  . magnesium oxide (MAG-OX) 400 MG tablet Take 1 tablet by mouth 2 (two) times daily.    . Melatonin 10 MG CAPS Take 10 mg by mouth at bedtime.    . niacinamide 500 MG tablet Take 500 mg by mouth 3 (three) times daily with meals.    . ondansetron (ZOFRAN) 8 MG tablet Take 1 tablet (8 mg total) by mouth every 8 (eight) hours as needed for nausea. 60 tablet 1  . Oxycodone HCl 10 MG TABS Take 1 tablet (10 mg total) by mouth 2 (two) times daily as needed. 30 tablet 0  . pramipexole (MIRAPEX) 0.5 MG tablet Take 1 mg by mouth at bedtime.     No current facility-administered medications for this visit.   Facility-Administered Medications Ordered in Other Visits  Medication Dose Route Frequency Provider Last Rate Last Admin  . heparin lock flush 100 unit/mL  500 Units Intracatheter Once PRN Alvy Bimler, Lacoya Wilbanks, MD      . pembrolizumab (KEYTRUDA) 200 mg in sodium chloride 0.9 % 50 mL chemo infusion  200 mg Intravenous Once Crystol Walpole, MD      . sodium chloride flush (NS) 0.9 % injection 10 mL  10 mL Intracatheter PRN Alvy Bimler, Krystie Leiter, MD        PHYSICAL EXAMINATION: ECOG PERFORMANCE STATUS: 0 - Asymptomatic  Vitals:   10/26/20 1223  BP: (!) 159/72  Pulse: 73  Resp: 18  Temp: 98 F (36.7 C)  SpO2: 98%   Filed Weights   10/26/20 1223  Weight: 206 lb 12.8 oz (93.8 kg)    GENERAL:alert, no distress and comfortable SKIN: skin color, texture, turgor are normal, no  rashes or significant lesions EYES: normal, Conjunctiva are pink and non-injected, sclera clear OROPHARYNX:no exudate, no erythema and lips, buccal mucosa, and tongue normal  NECK: supple, thyroid normal size, non-tender, without nodularity LYMPH:  no palpable lymphadenopathy in the cervical, axillary or inguinal LUNGS: clear to auscultation and percussion with normal breathing effort HEART: regular rate & rhythm and no murmurs and no lower extremity edema ABDOMEN:abdomen soft, non-tender and normal bowel sounds Musculoskeletal:no cyanosis of digits and no clubbing  NEURO: alert & oriented x 3 with fluent speech, no focal motor/sensory deficits  LABORATORY DATA:  I have reviewed the data as listed    Component Value Date/Time   NA 139 10/26/2020 1201   NA 141 07/09/2017 1312   K 3.9 10/26/2020 1201   K 3.9 07/09/2017 1312   CL 103 10/26/2020 1201   CO2 26 10/26/2020 1201   CO2 27 07/09/2017 1312   GLUCOSE 87 10/26/2020 1201   GLUCOSE 109 07/09/2017 1312   BUN 21 10/26/2020  1201   BUN 16.4 07/09/2017 1312   CREATININE 0.94 10/26/2020 1201   CREATININE 0.91 01/04/2019 1351   CREATININE 0.8 07/09/2017 1312   CALCIUM 9.3 10/26/2020 1201   CALCIUM 9.2 07/09/2017 1312   PROT 6.9 10/26/2020 1201   PROT 7.1 07/09/2017 1312   ALBUMIN 3.7 10/26/2020 1201   ALBUMIN 3.1 (L) 07/09/2017 1312   AST 18 10/26/2020 1201   AST 19 01/04/2019 1351   AST 35 (H) 07/09/2017 1312   ALT 12 10/26/2020 1201   ALT 20 01/04/2019 1351   ALT 43 07/09/2017 1312   ALKPHOS 118 10/26/2020 1201   ALKPHOS 182 (H) 07/09/2017 1312   BILITOT 0.6 10/26/2020 1201   BILITOT 0.3 01/04/2019 1351   BILITOT 0.34 07/09/2017 1312   GFRNONAA >60 10/26/2020 1201   GFRNONAA >60 01/04/2019 1351   GFRAA >60 03/29/2020 1105   GFRAA >60 01/04/2019 1351    No results found for: SPEP, UPEP  Lab Results  Component Value Date   WBC 7.6 10/26/2020   NEUTROABS 4.2 10/26/2020   HGB 11.9 (L) 10/26/2020   HCT 36.0  10/26/2020   MCV 83.1 10/26/2020   PLT 215 10/26/2020      Chemistry      Component Value Date/Time   NA 139 10/26/2020 1201   NA 141 07/09/2017 1312   K 3.9 10/26/2020 1201   K 3.9 07/09/2017 1312   CL 103 10/26/2020 1201   CO2 26 10/26/2020 1201   CO2 27 07/09/2017 1312   BUN 21 10/26/2020 1201   BUN 16.4 07/09/2017 1312   CREATININE 0.94 10/26/2020 1201   CREATININE 0.91 01/04/2019 1351   CREATININE 0.8 07/09/2017 1312      Component Value Date/Time   CALCIUM 9.3 10/26/2020 1201   CALCIUM 9.2 07/09/2017 1312   ALKPHOS 118 10/26/2020 1201   ALKPHOS 182 (H) 07/09/2017 1312   AST 18 10/26/2020 1201   AST 19 01/04/2019 1351   AST 35 (H) 07/09/2017 1312   ALT 12 10/26/2020 1201   ALT 20 01/04/2019 1351   ALT 43 07/09/2017 1312   BILITOT 0.6 10/26/2020 1201   BILITOT 0.3 01/04/2019 1351   BILITOT 0.34 07/09/2017 1312

## 2020-10-26 NOTE — Patient Instructions (Signed)
[facility] Discharge Instructions for Patients Receiving Chemotherapy  Today you received the following chemotherapy agents: pembrolizumab.  To help prevent nausea and vomiting after your treatment, we encourage you to take your nausea medication as directed.   If you develop nausea and vomiting that is not controlled by your nausea medication, call the clinic.   BELOW ARE SYMPTOMS THAT SHOULD BE REPORTED IMMEDIATELY:  *FEVER GREATER THAN 100.5 F  *CHILLS WITH OR WITHOUT FEVER  NAUSEA AND VOMITING THAT IS NOT CONTROLLED WITH YOUR NAUSEA MEDICATION  *UNUSUAL SHORTNESS OF BREATH  *UNUSUAL BRUISING OR BLEEDING  TENDERNESS IN MOUTH AND THROAT WITH OR WITHOUT PRESENCE OF ULCERS  *URINARY PROBLEMS  *BOWEL PROBLEMS  UNUSUAL RASH Items with * indicate a potential emergency and should be followed up as soon as possible.  Feel free to call the clinic should you have any questions or concerns. The clinic phone number is [phone].  Please show the CHEMO ALERT CARD at check-in to the Emergency Department and triage nurse.   

## 2020-10-26 NOTE — Assessment & Plan Note (Signed)
Her recent TSH is intermittently elevated We will monitor closely while she is on treatment

## 2020-11-16 ENCOUNTER — Other Ambulatory Visit: Payer: Self-pay

## 2020-11-16 ENCOUNTER — Inpatient Hospital Stay: Payer: Medicare Other | Attending: Hematology and Oncology

## 2020-11-16 ENCOUNTER — Inpatient Hospital Stay: Payer: Medicare Other

## 2020-11-16 VITALS — BP 134/73 | HR 66 | Temp 98.6°F | Resp 18

## 2020-11-16 DIAGNOSIS — C541 Malignant neoplasm of endometrium: Secondary | ICD-10-CM

## 2020-11-16 DIAGNOSIS — E039 Hypothyroidism, unspecified: Secondary | ICD-10-CM

## 2020-11-16 DIAGNOSIS — Z5112 Encounter for antineoplastic immunotherapy: Secondary | ICD-10-CM | POA: Diagnosis present

## 2020-11-16 DIAGNOSIS — C787 Secondary malignant neoplasm of liver and intrahepatic bile duct: Secondary | ICD-10-CM

## 2020-11-16 DIAGNOSIS — Z79899 Other long term (current) drug therapy: Secondary | ICD-10-CM | POA: Insufficient documentation

## 2020-11-16 LAB — CBC WITH DIFFERENTIAL/PLATELET
Abs Immature Granulocytes: 0.03 10*3/uL (ref 0.00–0.07)
Basophils Absolute: 0.1 10*3/uL (ref 0.0–0.1)
Basophils Relative: 1 %
Eosinophils Absolute: 0.5 10*3/uL (ref 0.0–0.5)
Eosinophils Relative: 6 %
HCT: 37.4 % (ref 36.0–46.0)
Hemoglobin: 12.4 g/dL (ref 12.0–15.0)
Immature Granulocytes: 0 %
Lymphocytes Relative: 18 %
Lymphs Abs: 1.5 10*3/uL (ref 0.7–4.0)
MCH: 28.2 pg (ref 26.0–34.0)
MCHC: 33.2 g/dL (ref 30.0–36.0)
MCV: 85 fL (ref 80.0–100.0)
Monocytes Absolute: 0.5 10*3/uL (ref 0.1–1.0)
Monocytes Relative: 6 %
Neutro Abs: 5.5 10*3/uL (ref 1.7–7.7)
Neutrophils Relative %: 69 %
Platelets: 204 10*3/uL (ref 150–400)
RBC: 4.4 MIL/uL (ref 3.87–5.11)
RDW: 14 % (ref 11.5–15.5)
WBC: 8 10*3/uL (ref 4.0–10.5)
nRBC: 0 % (ref 0.0–0.2)

## 2020-11-16 LAB — COMPREHENSIVE METABOLIC PANEL
ALT: 15 U/L (ref 0–44)
AST: 15 U/L (ref 15–41)
Albumin: 3.4 g/dL — ABNORMAL LOW (ref 3.5–5.0)
Alkaline Phosphatase: 123 U/L (ref 38–126)
Anion gap: 10 (ref 5–15)
BUN: 22 mg/dL (ref 8–23)
CO2: 25 mmol/L (ref 22–32)
Calcium: 9.4 mg/dL (ref 8.9–10.3)
Chloride: 104 mmol/L (ref 98–111)
Creatinine, Ser: 0.9 mg/dL (ref 0.44–1.00)
GFR, Estimated: >60 mL/min (ref >60)
Glucose, Bld: 88 mg/dL (ref 70–99)
Potassium: 4.1 mmol/L (ref 3.5–5.1)
Sodium: 139 mmol/L (ref 135–145)
Total Bilirubin: 0.5 mg/dL (ref 0.3–1.2)
Total Protein: 6.6 g/dL (ref 6.5–8.1)

## 2020-11-16 LAB — T4, FREE: Free T4: 1.33 ng/dL — ABNORMAL HIGH (ref 0.61–1.12)

## 2020-11-16 LAB — TSH: TSH: 6.631 u[IU]/mL — ABNORMAL HIGH (ref 0.308–3.960)

## 2020-11-16 MED ORDER — SODIUM CHLORIDE 0.9 % IV SOLN
Freq: Once | INTRAVENOUS | Status: AC
Start: 2020-11-16 — End: 2020-11-16
  Filled 2020-11-16: qty 250

## 2020-11-16 MED ORDER — ALTEPLASE 2 MG IJ SOLR
2.0000 mg | Freq: Once | INTRAMUSCULAR | Status: AC
  Administered 2020-11-16: 2 mg
  Filled 2020-11-16: qty 2

## 2020-11-16 MED ORDER — SODIUM CHLORIDE 0.9% FLUSH
10.0000 mL | INTRAVENOUS | Status: DC | PRN
  Administered 2020-11-16: 10 mL
  Filled 2020-11-16: qty 10

## 2020-11-16 MED ORDER — HEPARIN SOD (PORK) LOCK FLUSH 100 UNIT/ML IV SOLN
500.0000 [IU] | Freq: Once | INTRAVENOUS | Status: AC | PRN
  Administered 2020-11-16: 500 [IU]
  Filled 2020-11-16: qty 5

## 2020-11-16 MED ORDER — SODIUM CHLORIDE 0.9 % IV SOLN
200.0000 mg | Freq: Once | INTRAVENOUS | Status: AC
  Administered 2020-11-16: 200 mg via INTRAVENOUS
  Filled 2020-11-16: qty 8

## 2020-11-16 NOTE — Patient Instructions (Signed)
[facility] MEDICAL [facility]COLOGY  Discharge Instructions: ?Thank you for choosing [facility] to provide your oncology and hematology care.  ? ?If you have a lab appointment with the Cancer Center, please go directly to the Cancer Center and check in at the registration area. ?  ?Wear comfortable clothing and clothing appropriate for easy access to any Portacath or PICC line.  ? ?We strive to give you quality time with your provider. You may need to reschedule your appointment if you arrive late (15 or more minutes).  Arriving late affects you and other patients whose appointments are after yours.  Also, if you miss three or more appointments without notifying the office, you may be dismissed from the clinic at the provider?s discretion.    ?  ?For prescription refill requests, have your pharmacy contact our office and allow 72 hours for refills to be completed.   ? ?Today you received the following chemotherapy and/or immunotherapy agents: Keytruda ?  ?To help prevent nausea and vomiting after your treatment, we encourage you to take your nausea medication as directed. ? ?BELOW ARE SYMPTOMS THAT SHOULD BE REPORTED IMMEDIATELY: ?*FEVER GREATER THAN 100.4 F (38 ?C) OR HIGHER ?*CHILLS OR SWEATING ?*NAUSEA AND VOMITING THAT IS NOT CONTROLLED WITH YOUR NAUSEA MEDICATION ?*UNUSUAL SHORTNESS OF BREATH ?*UNUSUAL BRUISING OR BLEEDING ?*URINARY PROBLEMS (pain or burning when urinating, or frequent urination) ?*BOWEL PROBLEMS (unusual diarrhea, constipation, pain near the anus) ?TENDERNESS IN MOUTH AND THROAT WITH OR WITHOUT PRESENCE OF ULCERS (sore throat, sores in mouth, or a toothache) ?UNUSUAL RASH, SWELLING OR PAIN  ?UNUSUAL VAGINAL DISCHARGE OR ITCHING  ? ?Items with * indicate a potential emergency and should be followed up as soon as possible or go to the Emergency Department if any problems should occur. ? ?Please show the CHEMOTHERAPY ALERT CARD or IMMUNOTHERAPY ALERT CARD at check-in to the  Emergency Department and triage nurse. ? ?Should you have questions after your visit or need to cancel or reschedule your appointment, please contact [facility]COL[facility]  Dept: [phone]  and follow the prompts.  Office hours are 8:00 a.m. to 4:30 p.m. Monday - Friday. Please note that voicemails left after 4:00 p.m. may not be returned until the following business day.  We are closed weekends and major holidays. You have access to a nurse at all times for urgent questions. Please call the main number to the clinic Dept: [phone] and follow the prompts. ? ? ?For any non-urgent questions, you may also contact your provider using MyChart. We now offer e-Visits for anyone 18 and older to request care online for non-urgent symptoms. For details visit [email or web address]. ?  ?Also download the MyChart app! Go to the app store, search "MyChart", open the app, select [facility], and log in with your MyChart username and password. ? ?Due to Covid, a mask is required upon entering the hospital/clinic. If you do not have a mask, one will be given to you upon arrival. For doctor visits, patients may have 1 support person aged 18 or older with them. For treatment visits, patients cannot have anyone with them due to current Covid guidelines and our immunocompromised population.  ? ?

## 2020-12-07 ENCOUNTER — Inpatient Hospital Stay: Payer: Medicare Other | Attending: Hematology and Oncology

## 2020-12-07 ENCOUNTER — Other Ambulatory Visit: Payer: Self-pay

## 2020-12-07 ENCOUNTER — Other Ambulatory Visit: Payer: Self-pay | Admitting: Hematology and Oncology

## 2020-12-07 ENCOUNTER — Inpatient Hospital Stay (HOSPITAL_BASED_OUTPATIENT_CLINIC_OR_DEPARTMENT_OTHER): Payer: Medicare Other | Admitting: Hematology and Oncology

## 2020-12-07 ENCOUNTER — Telehealth: Payer: Self-pay

## 2020-12-07 ENCOUNTER — Inpatient Hospital Stay: Payer: Medicare Other

## 2020-12-07 ENCOUNTER — Encounter: Payer: Self-pay | Admitting: Hematology and Oncology

## 2020-12-07 DIAGNOSIS — E663 Overweight: Secondary | ICD-10-CM | POA: Diagnosis not present

## 2020-12-07 DIAGNOSIS — C541 Malignant neoplasm of endometrium: Secondary | ICD-10-CM

## 2020-12-07 DIAGNOSIS — C787 Secondary malignant neoplasm of liver and intrahepatic bile duct: Secondary | ICD-10-CM | POA: Insufficient documentation

## 2020-12-07 DIAGNOSIS — Z79899 Other long term (current) drug therapy: Secondary | ICD-10-CM | POA: Diagnosis not present

## 2020-12-07 DIAGNOSIS — I10 Essential (primary) hypertension: Secondary | ICD-10-CM

## 2020-12-07 DIAGNOSIS — E039 Hypothyroidism, unspecified: Secondary | ICD-10-CM

## 2020-12-07 DIAGNOSIS — Z923 Personal history of irradiation: Secondary | ICD-10-CM | POA: Insufficient documentation

## 2020-12-07 DIAGNOSIS — Z5112 Encounter for antineoplastic immunotherapy: Secondary | ICD-10-CM | POA: Insufficient documentation

## 2020-12-07 LAB — CBC WITH DIFFERENTIAL/PLATELET
Abs Immature Granulocytes: 0.04 10*3/uL (ref 0.00–0.07)
Basophils Absolute: 0.1 10*3/uL (ref 0.0–0.1)
Basophils Relative: 1 %
Eosinophils Absolute: 0.2 10*3/uL (ref 0.0–0.5)
Eosinophils Relative: 2 %
HCT: 36.4 % (ref 36.0–46.0)
Hemoglobin: 12.1 g/dL (ref 12.0–15.0)
Immature Granulocytes: 1 %
Lymphocytes Relative: 24 %
Lymphs Abs: 1.8 10*3/uL (ref 0.7–4.0)
MCH: 27.9 pg (ref 26.0–34.0)
MCHC: 33.2 g/dL (ref 30.0–36.0)
MCV: 83.9 fL (ref 80.0–100.0)
Monocytes Absolute: 0.5 10*3/uL (ref 0.1–1.0)
Monocytes Relative: 6 %
Neutro Abs: 5.2 10*3/uL (ref 1.7–7.7)
Neutrophils Relative %: 66 %
Platelets: 215 10*3/uL (ref 150–400)
RBC: 4.34 MIL/uL (ref 3.87–5.11)
RDW: 13.5 % (ref 11.5–15.5)
WBC: 7.8 10*3/uL (ref 4.0–10.5)
nRBC: 0 % (ref 0.0–0.2)

## 2020-12-07 LAB — COMPREHENSIVE METABOLIC PANEL
ALT: 14 U/L (ref 0–44)
AST: 19 U/L (ref 15–41)
Albumin: 3.6 g/dL (ref 3.5–5.0)
Alkaline Phosphatase: 142 U/L — ABNORMAL HIGH (ref 38–126)
Anion gap: 12 (ref 5–15)
BUN: 21 mg/dL (ref 8–23)
CO2: 24 mmol/L (ref 22–32)
Calcium: 9.5 mg/dL (ref 8.9–10.3)
Chloride: 104 mmol/L (ref 98–111)
Creatinine, Ser: 0.77 mg/dL (ref 0.44–1.00)
GFR, Estimated: >60 mL/min (ref >60)
Glucose, Bld: 87 mg/dL (ref 70–99)
Potassium: 3.8 mmol/L (ref 3.5–5.1)
Sodium: 140 mmol/L (ref 135–145)
Total Bilirubin: 0.7 mg/dL (ref 0.3–1.2)
Total Protein: 7 g/dL (ref 6.5–8.1)

## 2020-12-07 LAB — T4, FREE: Free T4: 1.23 ng/dL — ABNORMAL HIGH (ref 0.61–1.12)

## 2020-12-07 LAB — TSH: TSH: 5.109 u[IU]/mL — ABNORMAL HIGH (ref 0.308–3.960)

## 2020-12-07 MED ORDER — SODIUM CHLORIDE 0.9% FLUSH
10.0000 mL | Freq: Once | INTRAVENOUS | Status: AC
Start: 2020-12-07 — End: 2020-12-07
  Administered 2020-12-07: 10 mL
  Filled 2020-12-07: qty 10

## 2020-12-07 MED ORDER — SODIUM CHLORIDE 0.9 % IV SOLN
Freq: Once | INTRAVENOUS | Status: AC
Start: 2020-12-07 — End: 2020-12-07
  Filled 2020-12-07: qty 250

## 2020-12-07 MED ORDER — SODIUM CHLORIDE 0.9% FLUSH
10.0000 mL | INTRAVENOUS | Status: DC | PRN
Start: 2020-12-07 — End: 2020-12-07
  Administered 2020-12-07: 10 mL
  Filled 2020-12-07: qty 10

## 2020-12-07 MED ORDER — LEVOTHYROXINE SODIUM 200 MCG PO TABS: 200.0000 ug | ORAL_TABLET | Freq: Every day | ORAL | 3 refills | Status: DC

## 2020-12-07 MED ORDER — SODIUM CHLORIDE 0.9 % IV SOLN
200.0000 mg | Freq: Once | INTRAVENOUS | Status: AC
  Administered 2020-12-07: 200 mg via INTRAVENOUS
  Filled 2020-12-07: qty 8

## 2020-12-07 MED ORDER — HEPARIN SOD (PORK) LOCK FLUSH 100 UNIT/ML IV SOLN
500.0000 [IU] | Freq: Once | INTRAVENOUS | Status: AC | PRN
  Administered 2020-12-07: 500 [IU]
  Filled 2020-12-07: qty 5

## 2020-12-07 MED ORDER — LORAZEPAM 1 MG PO TABS: 1.0000 mg | ORAL_TABLET | Freq: Three times a day (TID) | ORAL | 0 refills | Status: DC | PRN

## 2020-12-07 NOTE — Assessment & Plan Note (Signed)
Her last CT imaging showed no signs of disease recurrence or relapse The plan will be to continue pembrolizumab indefinitely I plan to space out the interval between her imaging studies I will plan to order next CT imaging in September

## 2020-12-07 NOTE — Telephone Encounter (Signed)
-----   Message from Heath Lark, MD sent at 12/07/2020  1:39 PM EDT ----- Pls tell her TSH is ok I sent refill to her pharmacy

## 2020-12-07 NOTE — Patient Instructions (Signed)

## 2020-12-07 NOTE — Assessment & Plan Note (Signed)
She has intermittent elevated blood pressure Could be exacerbated by anxiety We discussed importance of exercise and weight loss strategies For now, we will observe closely She will continue her current prescribed antihypertensives

## 2020-12-07 NOTE — Telephone Encounter (Signed)
Called and left below message. Ask her to call the office back for questions. 

## 2020-12-07 NOTE — Patient Instructions (Signed)
[facility]ICAL [facility]COLOG[facility]  ° Discharge Instructions: °Thank you for choosing [facility] to provide your oncology and hematology care.  ° °If you have a lab appointment with the Cancer Center, please go directly to the Cancer Center and check in at the registration area. °  °Wear comfortable clothing and clothing appropriate for easy access to any Portacath or PICC line.  ° °We strive to give you quality time with your provider. You may need to reschedule your appointment if you arrive late (15 or more minutes).  Arriving late affects you and other patients whose appointments are after yours.  Also, if you miss three or more appointments without notifying the office, you may be dismissed from the clinic at the provider’s discretion.    °  °For prescription refill requests, have your pharmacy contact our office and allow 72 hours for refills to be completed.   ° °Today you received the following chemotherapy and/or immunotherapy agents: Pembrolizumab (Keytruda) °  °To help prevent nausea and vomiting after your treatment, we encourage you to take your nausea medication as directed. ° °BELOW ARE SYMPTOMS THAT SHOULD BE REPORTED IMMEDIATELY: °*FEVER GREATER THAN 100.4 F (38 °C) OR HIGHER °*CHILLS OR SWEATING °*NAUSEA AND VOMITING THAT IS NOT CONTROLLED WITH YOUR NAUSEA MEDICATION °*UNUSUAL SHORTNESS OF BREATH °*UNUSUAL BRUISING OR BLEEDING °*URINARY PROBLEMS (pain or burning when urinating, or frequent urination) °*BOWEL PROBLEMS (unusual diarrhea, constipation, pain near the anus) °TENDERNESS IN MOUTH AND THROAT WITH OR WITHOUT PRESENCE OF ULCERS (sore throat, sores in mouth, or a toothache) °UNUSUAL RASH, SWELLING OR PAIN  °UNUSUAL VAGINAL DISCHARGE OR ITCHING  ° °Items with * indicate a potential emergency and should be followed up as soon as possible or go to the Emergency Department if any problems should occur. ° °Please show the CHEMOTHERAPY ALERT CARD or IMMUNOTHERAPY ALERT CARD  at check-in to the Emergency Department and triage nurse. ° °Should you have questions after your visit or need to cancel or reschedule your appointment, please contact [facility]  Dept: [phone]  and follow the prompts.  Office hours are 8:00 a.m. to 4:30 p.m. Monday - Friday. Please note that voicemails left after 4:00 p.m. may not be returned until the following business day.  We are closed weekends and major holidays. You have access to a nurse at all times for urgent questions. Please call the main number to the clinic Dept: [phone] and follow the prompts. ° ° °For any non-urgent questions, you may also contact your provider using MyChart. We now offer e-Visits for anyone 18 and older to request care online for non-urgent symptoms. For details visit [email or web address]. °  °Also download the MyChart app! Go to the app store, search "MyChart", open the app, select [facility], and log in with your MyChart username and password. ° °Due to Covid, a mask is required upon entering the hospital/clinic. If you do not have a mask, one will be given to you upon arrival. For doctor visits, patients may have 1 support person aged 18 or older with them. For treatment visits, patients cannot have anyone with them due to current Covid guidelines and our immunocompromised population.  ° °

## 2020-12-07 NOTE — Assessment & Plan Note (Signed)
She has gained weight since last time I saw her and she admits that she has not been vigilant about her diet I encouraged her to try aggressive lifestyle changes

## 2020-12-07 NOTE — Progress Notes (Signed)
[place] OFFICE PROGRESS NOTE  Patient Care Team: [name], MD as PCP - General (Family Medicine)  ASSESSMENT & PLAN:  Recurrent carcinoma of endometrium [facility]) Her last CT imaging showed no signs of disease recurrence or relapse The plan will be to continue pembrolizumab indefinitely I plan to space out the interval between her imaging studies I will plan to order next CT imaging in September  Overweight (BMI 25.0-29.9) She has gained weight since last time I saw her and she admits that she has not been vigilant about her diet I encouraged her to try aggressive lifestyle changes  Acquired hypothyroidism Her TSH is stable She will continue her prescribed dose of Synthroid I refill her prescription today  Essential hypertension She has intermittent elevated blood pressure Could be exacerbated by anxiety We discussed importance of exercise and weight loss strategies For now, we will observe closely She will continue her current prescribed antihypertensives   No orders of the defined types were placed in this encounter.   All questions were answered. The patient knows to call the clinic with any problems, questions or concerns. The total time spent in the appointment was 20 minutes encounter with patients including review of chart and various tests results, discussions about plan of care and coordination of care plan   Heath Lark, MD 12/07/2020 1:40 PM  INTERVAL HISTORY: Please see below for problem oriented charting. She returns for treatment and follow-up She is doing well Unfortunately, she has gained a bit of weight again due to unrestricted diet She denies abdominal pain, changes in bowel habits, bloating No infusion reaction  SUMMARY OF ONCOLOGIC HISTORY: Oncology History Overview Note  MSI stable on June 2017 tissue but high from [facility] study from November 2017  05/15/16: ER is moderately positive (70%). PR is strongly positive  (80%).   Recurrent carcinoma of endometrium ([place])  01/02/2016 Pathology Results   Uterus +/- tubes/ovaries, neoplastic, cervix ENDOMETRIAL ADENOCARCINOMA, FIGO GRADE 2 (4.7 CM) THE TUMOR INVADES LESS THAN ONE-HALF OF THE MYOMETRIUM (PT1A) ALL MARGINS OF RESECTION ARE NEGATIVE FOR CARCINOMA LEIOMYOMAS AND ADENOMYOSIS BILATERAL FALLOPIAN TUBES AND OVARIES: HISTOLOGICAL UNREMARKABLE 2. Lymph node, sentinel, biopsy, right obturator ONE BENIGN LYMPH NODE (0/1) 3. Lymph nodes, regional resection, left pelvic FOUR BENIGN LYMPH NODES (0/4)   01/02/2016 Surgery   Dr. Denman George performed robotic-assisted laparoscopic total hysterectomy with bilateral salpingoophorectomy, sentinel lymph node biopsy, lymphadenectomy     05/15/2016 Pathology Results   Vagina, biopsy, mid - ADENOCARCINOMA, SEE COMMENT. Microscopic Comment The morphology along with the patient's history are consistent with recurrent endometrioid adenocarcinoma. The carcinoma has a similar appearance to the primary ([id number]).   05/20/2016 Imaging   Ct scan abdomen showed solid 2.5 cm peritoneal mass in the mid to anterior left pelvis, suspicious for peritoneal metastasis. 2. Small expansile low-attenuation filling defect in the left external iliac vein, cannot exclude a small deep venous thrombus. Consider correlation with left lower extremity venous Doppler scan. 3. Small simple fluid density structure in the left pelvic sidewall abutting the left external iliac vessels, favor a small postoperative seroma. 4. No ascites. 5. No lymphadenopathy.  No metastatic disease in the chest. 6. Aortic atherosclerosis.   06/12/2016 PET scan   Intensely hypermetabolic 2.1 cm central pelvic peritoneal mass just to the left of midline, consistent with peritoneal metastatic recurrence. No ascites. 2. No additional hypermetabolic sites of metastatic disease. 3. Diffuse thyroid hypermetabolism without discrete thyroid nodule, favoring thyroiditis. Recommend  correlation with serum thyroid function  tests.   06/24/2016 - 07/22/2016 Chemotherapy   The patient had weekly cisplatin. She has missed several doses due to infection and pancytopenia   06/24/2016 - 09/04/2016 Radiation Therapy   She completed concurrent radiation therapy Radiation treatment dates:   IMRT : 06/24/16 - 08/01/16 HDR : 08/13/16, 08/20/16, 08/27/16, 09/04/16  Site/dose:   Pelvis treated to 55 Gy in 25 fractions (simultaneous integrated boost technique) Vaginal Cuff treated to 24 Gy in 4 fractions   08/15/2016 - 12/03/2016 Chemotherapy   She received 6 cycles of carboplatin/Taxol   09/27/2016 Imaging   Interval decrease in size of previously described solid peritoneal nodule within the left anterior pelvis. Near complete resolution of previously described low-attenuation structure along the left pelvic sidewall. No evidence for metastatic disease in the chest. Aortic atherosclerosis.   12/30/2016 Imaging   Ct abdomen 1. Solitary left pelvic peritoneal implant is mildly decreased in size in the interval. 2. No new or progressive metastatic disease in the abdomen or pelvis. No ascites. 3. Aortic atherosclerosis.   04/02/2017 Imaging   Left lower quadrant peritoneal implant referenced on previous exam measures 1.7 x 1.3 cm, image 69 of series 2. Increased from 0.8 x 0.8 cm previously peer no new peritoneal implants identified.  Musculoskeletal: The degenerative disc disease noted within the lumbar spine.  IMPRESSION: 1. Solitary left pelvic peritoneal implant is increased in size in the interval. 2. No new sites of disease.  No ascites. 3. Aortic atherosclerosis   04/11/2017 PET scan   1. The left side of pelvis peritoneal implant has decreased in size and degree of FDG uptake compatible with response to therapy. No new areas of peritoneal disease identified. 2. Persistent diffuse increased uptake within the thyroid gland. Correlation with patient's thyroid function may be  helpful.   05/26/2017 Imaging   1. Enlarging tumor implant along the left adnexa, currently 2.6 by 2.4 cm and previously 1.9 by 1.3 cm. No new tumor implant or other specific cause for the patient's pelvic symptoms is currently identified. 2.  Aortic Atherosclerosis (ICD10-I70.0). 3. Lumbar spondylosis and degenerative disc disease causing multilevel impingement.   06/04/2017 - 08/28/2017 Chemotherapy   The patient started letrozole and everolimus   08/26/2017 Imaging   Increased size of mass in the left adnexal region.  Several new small liver metastases in right hepatic lobe   09/08/2017 Imaging   LV EF: 60% -  65%   09/10/2017 Procedure   Technically successful right IJ power-injectable port catheter placement. Ready for routine use   12/04/2017 Imaging   1. Interval increase in size and number of multiple lesions within the liver compatible with hepatic metastatic disease. 2. Interval increase in size mass within the left hemipelvis.   12/15/2017 -  Chemotherapy   The patient had pembrolizumab (KEYTRUDA) 200 mg in sodium chloride 0.9 % 50 mL chemo infusion, 200 mg, Intravenous, Once, 1 of 6 cycles Administration: 200 mg (12/15/2017)  for chemotherapy treatment.    12/21/2017 Imaging   1. Moderate left hydronephrosis and hydroureter, similar compared to most recent CT from May 2019; obstruction appears to be secondary to a left pelvic mass/metastatic focus which has increased in size since the prior CT. 2. Numerous hepatic metastatic lesions, suspect that some may be increased in size but difficult to further characterize without intravenous contrast. Increased size of left pelvic soft tissue mass/metastatic lesion. Mass abuts and possibly invades the sigmoid colon.    02/20/2018 Imaging   Interval decrease in hepatic metastases.  Decreased mass  or lymphadenopathy in the left external iliac chain. Interval resolution of left hydroureteronephrosis.  No new or progressive disease  within the abdomen or pelvis.   05/16/2018 Imaging   05/16/2018 CT Abdomen IMPRESSION: 1. Slight interval decrease in size of hepatic metastatic disease. 2. Slight interval decrease in size centrally necrotic mass within the left external iliac region.   09/18/2018 Imaging   1. Today's study demonstrates a mixed response to therapy. Specifically, while the previously noted hepatic metastases appear decreased, the soft tissue mass along the left pelvic sidewall appears slightly larger than the prior study. No new metastatic disease is noted elsewhere in the abdomen or pelvis. 2. Aortic atherosclerosis. 3. Mild cardiomegaly. 4. Additional incidental findings, as above    03/12/2019 Imaging   1. Overall improvement with reduced size of the hepatic metastatic lesions, and reduced size of the irregular soft tissue mass in the left pelvis. 2. Other imaging findings of potential clinical significance: Mild distal esophageal wall thickening, cannot exclude esophagitis. Aortic Atherosclerosis (ICD10-I70.0). Lumbar impingement at L5-S1.     09/09/2019 Imaging   1. Stable size and appearance of the somewhat irregular left adnexal mass, currently 2.8 by 1.9 cm. 2. The scattered hepatic metastatic lesions are stable from 03/12/2019, and represent effectively treated metastatic lesions. 3. Other imaging findings of potential clinical significance: Airway thickening is present, suggesting bronchitis or reactive airways disease. Lumbar spondylosis and degenerative disc disease causing impingement at L4-5 and L5-S1. Small umbilical hernia contains adipose tissue.   Aortic Atherosclerosis (ICD10-I70.0).   03/07/2020 Imaging   1. Stable appearance of LEFT pelvic mass without new signs of disease. Mass remains closely approximated to the colon, in the vicinity of the distal LEFT ureter and the iliac vessels. 2. Stable signs of hepatic metastatic disease with calcified lesion at the dome of the liver and subtle  areas of low attenuation elsewhere which show no change.   09/12/2020 Imaging   1. Stable appearance of the partially calcified LEFT pelvic sidewall soft tissue mass with unchanged involvement of the sigmoid colon, iliac vessels and close proximity to the left ureter. No new signs of disease. 2. Stable signs of the hepatic metastatic disease with calcified lesion at the hepatic dome and subcentimeter area of low attenuation in the right hepatic lobe. 3. No evidence of new or enlarging abdominopelvic metastatic disease. 4. Aortic atherosclerosis.     REVIEW OF SYSTEMS:   Constitutional: Denies fevers, chills or abnormal weight loss Eyes: Denies blurriness of vision Ears, nose, mouth, throat, and face: Denies mucositis or sore throat Respiratory: Denies cough, dyspnea or wheezes Cardiovascular: Denies palpitation, chest discomfort or lower extremity swelling Gastrointestinal:  Denies nausea, heartburn or change in bowel habits Skin: Denies abnormal skin rashes Lymphatics: Denies new lymphadenopathy or easy bruising Neurological:Denies numbness, tingling or new weaknesses Behavioral/Psych: Mood is stable, no new changes  All other systems were reviewed with the patient and are negative.  I have reviewed the past medical history, past surgical history, social history and family history with the patient and they are unchanged from previous note.  ALLERGIES:  is allergic to latex and lisinopril.  MEDICATIONS:  Current Outpatient Medications  Medication Sig Dispense Refill  . acetaminophen (TYLENOL) 325 MG tablet Take 650 mg by mouth every 6 (six) hours as needed for mild pain or moderate pain.     Marland Kitchen amoxicillin (AMOXIL) 500 MG capsule Take 2,000 mg by mouth See admin instructions. Prior to dental appointment, takes 4 tables prior to appt    .  Biotin w/ Vitamins C & E (HAIR/SKIN/NAILS PO) Take 1 tablet by mouth daily.    . Calcium Carb-Cholecalciferol (CALCIUM 600 + D PO) Take 2 tablets by  mouth daily.    . fluticasone (FLONASE) 50 MCG/ACT nasal spray Place 1 spray into both nostrils 2 (two) times daily as needed for allergies.   1  . gabapentin (NEURONTIN) 600 MG tablet TAKE 1 TABLET BY MOUTH TWICE A DAY FOR RESTLESS LEGS AND NEUROPATHY. 60 tablet 11  . levothyroxine (SYNTHROID) 200 MCG tablet Take 1 tablet (200 mcg total) by mouth daily. 90 tablet 3  . lidocaine-prilocaine (EMLA) cream APPLY TO AFFECTED AREA ONCE 30 g 3  . loratadine (CLARITIN) 10 MG tablet Take 10 mg by mouth daily.    Marland Kitchen LORazepam (ATIVAN) 1 MG tablet Take 1 tablet (1 mg total) by mouth every 8 (eight) hours as needed for anxiety or sleep. 90 tablet 0  . losartan-hydrochlorothiazide (HYZAAR) 100-12.5 MG tablet Take 0.5 tablets by mouth daily. 60 tablet 9  . magnesium oxide (MAG-OX) 400 MG tablet Take 1 tablet by mouth 2 (two) times daily.    . Melatonin 10 MG CAPS Take 10 mg by mouth at bedtime.    . niacinamide 500 MG tablet Take 500 mg by mouth 3 (three) times daily with meals.    . ondansetron (ZOFRAN) 8 MG tablet Take 1 tablet (8 mg total) by mouth every 8 (eight) hours as needed for nausea. 60 tablet 1  . Oxycodone HCl 10 MG TABS Take 1 tablet (10 mg total) by mouth 2 (two) times daily as needed. 30 tablet 0  . pramipexole (MIRAPEX) 0.5 MG tablet Take 1 mg by mouth at bedtime.     No current facility-administered medications for this visit.   Facility-Administered Medications Ordered in Other Visits  Medication Dose Route Frequency Provider Last Rate Last Admin  . heparin lock flush 100 unit/mL  500 Units Intracatheter Once PRN Alvy Bimler, Coley Kulikowski, MD      . pembrolizumab (KEYTRUDA) 200 mg in sodium chloride 0.9 % 50 mL chemo infusion  200 mg Intravenous Once Heath Lark, MD 116 mL/hr at 12/07/20 1319 200 mg at 12/07/20 1319  . sodium chloride flush (NS) 0.9 % injection 10 mL  10 mL Intracatheter PRN Alvy Bimler, Rilea Arutyunyan, MD        PHYSICAL EXAMINATION: ECOG PERFORMANCE STATUS: 0 - Asymptomatic  Vitals:   12/07/20  1204  BP: (!) 159/79  Pulse: 73  Resp: 18  Temp: 97.8 F (36.6 C)  SpO2: 100%   Filed Weights   12/07/20 1204  Weight: 208 lb 9.6 oz (94.6 kg)    GENERAL:alert, no distress and comfortable SKIN: skin color, texture, turgor are normal, no rashes or significant lesions EYES: normal, Conjunctiva are pink and non-injected, sclera clear OROPHARYNX:no exudate, no erythema and lips, buccal mucosa, and tongue normal  NECK: supple, thyroid normal size, non-tender, without nodularity LYMPH:  no palpable lymphadenopathy in the cervical, axillary or inguinal LUNGS: clear to auscultation and percussion with normal breathing effort HEART: regular rate & rhythm and no murmurs and no lower extremity edema ABDOMEN:abdomen soft, non-tender and normal bowel sounds Musculoskeletal:no cyanosis of digits and no clubbing  NEURO: alert & oriented x 3 with fluent speech, no focal motor/sensory deficits  LABORATORY DATA:  I have reviewed the data as listed    Component Value Date/Time   NA 140 12/07/2020 1137   NA 141 07/09/2017 1312   K 3.8 12/07/2020 1137   K 3.9 07/09/2017 1312  CL 104 12/07/2020 1137   CO2 24 12/07/2020 1137   CO2 27 07/09/2017 1312   GLUCOSE 87 12/07/2020 1137   GLUCOSE 109 07/09/2017 1312   BUN 21 12/07/2020 1137   BUN 16.4 07/09/2017 1312   CREATININE 0.77 12/07/2020 1137   CREATININE 0.91 01/04/2019 1351   CREATININE 0.8 07/09/2017 1312   CALCIUM 9.5 12/07/2020 1137   CALCIUM 9.2 07/09/2017 1312   PROT 7.0 12/07/2020 1137   PROT 7.1 07/09/2017 1312   ALBUMIN 3.6 12/07/2020 1137   ALBUMIN 3.1 (L) 07/09/2017 1312   AST 19 12/07/2020 1137   AST 19 01/04/2019 1351   AST 35 (H) 07/09/2017 1312   ALT 14 12/07/2020 1137   ALT 20 01/04/2019 1351   ALT 43 07/09/2017 1312   ALKPHOS 142 (H) 12/07/2020 1137   ALKPHOS 182 (H) 07/09/2017 1312   BILITOT 0.7 12/07/2020 1137   BILITOT 0.3 01/04/2019 1351   BILITOT 0.34 07/09/2017 1312   GFRNONAA >60 12/07/2020 1137    GFRNONAA >60 01/04/2019 1351   GFRAA >60 03/29/2020 1105   GFRAA >60 01/04/2019 1351    No results found for: SPEP, UPEP  Lab Results  Component Value Date   WBC 7.8 12/07/2020   NEUTROABS 5.2 12/07/2020   HGB 12.1 12/07/2020   HCT 36.4 12/07/2020   MCV 83.9 12/07/2020   PLT 215 12/07/2020      Chemistry      Component Value Date/Time   NA 140 12/07/2020 1137   NA 141 07/09/2017 1312   K 3.8 12/07/2020 1137   K 3.9 07/09/2017 1312   CL 104 12/07/2020 1137   CO2 24 12/07/2020 1137   CO2 27 07/09/2017 1312   BUN 21 12/07/2020 1137   BUN 16.4 07/09/2017 1312   CREATININE 0.77 12/07/2020 1137   CREATININE 0.91 01/04/2019 1351   CREATININE 0.8 07/09/2017 1312      Component Value Date/Time   CALCIUM 9.5 12/07/2020 1137   CALCIUM 9.2 07/09/2017 1312   ALKPHOS 142 (H) 12/07/2020 1137   ALKPHOS 182 (H) 07/09/2017 1312   AST 19 12/07/2020 1137   AST 19 01/04/2019 1351   AST 35 (H) 07/09/2017 1312   ALT 14 12/07/2020 1137   ALT 20 01/04/2019 1351   ALT 43 07/09/2017 1312   BILITOT 0.7 12/07/2020 1137   BILITOT 0.3 01/04/2019 1351   BILITOT 0.34 07/09/2017 1312

## 2020-12-07 NOTE — Assessment & Plan Note (Signed)
Her TSH is stable She will continue her prescribed dose of Synthroid I refill her prescription today

## 2020-12-22 ENCOUNTER — Other Ambulatory Visit: Payer: Self-pay | Admitting: Hematology and Oncology

## 2021-01-01 ENCOUNTER — Other Ambulatory Visit: Payer: Self-pay

## 2021-01-01 ENCOUNTER — Other Ambulatory Visit: Payer: Medicare Other

## 2021-01-01 ENCOUNTER — Inpatient Hospital Stay: Payer: Medicare Other

## 2021-01-01 VITALS — BP 157/92 | HR 74 | Temp 97.9°F | Resp 20 | Wt 215.8 lb

## 2021-01-01 DIAGNOSIS — C541 Malignant neoplasm of endometrium: Secondary | ICD-10-CM

## 2021-01-01 DIAGNOSIS — E039 Hypothyroidism, unspecified: Secondary | ICD-10-CM

## 2021-01-01 DIAGNOSIS — Z5112 Encounter for antineoplastic immunotherapy: Secondary | ICD-10-CM | POA: Diagnosis not present

## 2021-01-01 DIAGNOSIS — C787 Secondary malignant neoplasm of liver and intrahepatic bile duct: Secondary | ICD-10-CM

## 2021-01-01 LAB — COMPREHENSIVE METABOLIC PANEL
ALT: 16 U/L (ref 0–44)
AST: 20 U/L (ref 15–41)
Albumin: 3.4 g/dL — ABNORMAL LOW (ref 3.5–5.0)
Alkaline Phosphatase: 127 U/L — ABNORMAL HIGH (ref 38–126)
Anion gap: 9 (ref 5–15)
BUN: 16 mg/dL (ref 8–23)
CO2: 28 mmol/L (ref 22–32)
Calcium: 9.1 mg/dL (ref 8.9–10.3)
Chloride: 103 mmol/L (ref 98–111)
Creatinine, Ser: 0.83 mg/dL (ref 0.44–1.00)
GFR, Estimated: >60 mL/min (ref >60)
Glucose, Bld: 85 mg/dL (ref 70–99)
Potassium: 3.9 mmol/L (ref 3.5–5.1)
Sodium: 140 mmol/L (ref 135–145)
Total Bilirubin: 0.6 mg/dL (ref 0.3–1.2)
Total Protein: 6.7 g/dL (ref 6.5–8.1)

## 2021-01-01 LAB — CBC WITH DIFFERENTIAL/PLATELET
Abs Immature Granulocytes: 0.01 10*3/uL (ref 0.00–0.07)
Basophils Absolute: 0.1 10*3/uL (ref 0.0–0.1)
Basophils Relative: 1 %
Eosinophils Absolute: 0.4 10*3/uL (ref 0.0–0.5)
Eosinophils Relative: 5 %
HCT: 35.5 % — ABNORMAL LOW (ref 36.0–46.0)
Hemoglobin: 11.8 g/dL — ABNORMAL LOW (ref 12.0–15.0)
Immature Granulocytes: 0 %
Lymphocytes Relative: 22 %
Lymphs Abs: 1.7 10*3/uL (ref 0.7–4.0)
MCH: 27.6 pg (ref 26.0–34.0)
MCHC: 33.2 g/dL (ref 30.0–36.0)
MCV: 82.9 fL (ref 80.0–100.0)
Monocytes Absolute: 0.5 10*3/uL (ref 0.1–1.0)
Monocytes Relative: 7 %
Neutro Abs: 5 10*3/uL (ref 1.7–7.7)
Neutrophils Relative %: 65 %
Platelets: 212 10*3/uL (ref 150–400)
RBC: 4.28 MIL/uL (ref 3.87–5.11)
RDW: 13.4 % (ref 11.5–15.5)
WBC: 7.6 10*3/uL (ref 4.0–10.5)
nRBC: 0 % (ref 0.0–0.2)

## 2021-01-01 MED ORDER — HEPARIN SOD (PORK) LOCK FLUSH 100 UNIT/ML IV SOLN
500.0000 [IU] | Freq: Once | INTRAVENOUS | Status: AC | PRN
  Administered 2021-01-01: 500 [IU]
  Filled 2021-01-01: qty 5

## 2021-01-01 MED ORDER — SODIUM CHLORIDE 0.9% FLUSH
10.0000 mL | INTRAVENOUS | Status: DC | PRN
  Administered 2021-01-01: 10 mL
  Filled 2021-01-01: qty 10

## 2021-01-01 MED ORDER — SODIUM CHLORIDE 0.9 % IV SOLN
Freq: Once | INTRAVENOUS | Status: AC
Start: 2021-01-01 — End: 2021-01-01
  Filled 2021-01-01: qty 250

## 2021-01-01 MED ORDER — SODIUM CHLORIDE 0.9 % IV SOLN
200.0000 mg | Freq: Once | INTRAVENOUS | Status: AC
  Administered 2021-01-01: 200 mg via INTRAVENOUS
  Filled 2021-01-01: qty 8

## 2021-01-01 NOTE — Patient Instructions (Signed)
Pembrolizumab injection What is this medication? PEMBROLIZUMAB (pem broe liz ue mab) is a monoclonal antibody. It is used totreat certain types of cancer. This medicine may be used for other purposes; ask your health care provider orpharmacist if you have questions. COMMON BRAND NAME(S): Keytruda What should I tell my care team before I take this medication? They need to know if you have any of these conditions: autoimmune diseases like Crohn's disease, ulcerative colitis, or lupus have had or planning to have an allogeneic stem cell transplant (uses someone else's stem cells) history of organ transplant history of chest radiation nervous system problems like myasthenia gravis or Guillain-Barre syndrome an unusual or allergic reaction to pembrolizumab, other medicines, foods, dyes, or preservatives pregnant or trying to get pregnant breast-feeding How should I use this medication? This medicine is for infusion into a vein. It is given by a health careprofessional in a hospital or clinic setting. A special MedGuide will be given to you before each treatment. Be sure to readthis information carefully each time. Talk to your pediatrician regarding the use of this medicine in children. While this drug may be prescribed for children as young as 6 months for selectedconditions, precautions do apply. Overdosage: If you think you have taken too much of this medicine contact apoison control center or emergency room at once. NOTE: This medicine is only for you. Do not share this medicine with others. What if I miss a dose? It is important not to miss your dose. Call your doctor or health careprofessional if you are unable to keep an appointment. What may interact with this medication? Interactions have not been studied. This list may not describe all possible interactions. Give your health care provider a list of all the medicines, herbs, non-prescription drugs, or dietary supplements you use. Also  tell them if you smoke, drink alcohol, or use illegaldrugs. Some items may interact with your medicine. What should I watch for while using this medication? Your condition will be monitored carefully while you are receiving thismedicine. You may need blood work done while you are taking this medicine. Do not become pregnant while taking this medicine or for 4 months after stopping it. Women should inform their doctor if they wish to become pregnant or think they might be pregnant. There is a potential for serious side effects to an unborn child. Talk to your health care professional or pharmacist for more information. Do not breast-feed an infant while taking this medicine orfor 4 months after the last dose. What side effects may I notice from receiving this medication? Side effects that you should report to your doctor or health care professionalas soon as possible: allergic reactions like skin rash, itching or hives, swelling of the face, lips, or tongue bloody or black, tarry breathing problems changes in vision chest pain chills confusion constipation cough diarrhea dizziness or feeling faint or lightheaded fast or irregular heartbeat fever flushing joint pain low blood counts - this medicine may decrease the number of white blood cells, red blood cells and platelets. You may be at increased risk for infections and bleeding. muscle pain muscle weakness pain, tingling, numbness in the hands or feet persistent headache redness, blistering, peeling or loosening of the skin, including inside the mouth signs and symptoms of high blood sugar such as dizziness; dry mouth; dry skin; fruity breath; nausea; stomach pain; increased hunger or thirst; increased urination signs and symptoms of kidney injury like trouble passing urine or change in the amount of urine signs and   symptoms of liver injury like dark urine, light-colored stools, loss of appetite, nausea, right upper belly pain,  yellowing of the eyes or skin sweating swollen lymph nodes weight loss Side effects that usually do not require medical attention (report to yourdoctor or health care professional if they continue or are bothersome): decreased appetite hair loss tiredness This list may not describe all possible side effects. Call your doctor for medical advice about side effects. You may report side effects to FDA at1-800-FDA-1088. Where should I keep my medication? This drug is given in a hospital or clinic and will not be stored at home. NOTE: This sheet is a summary. It may not cover all possible information. If you have questions about this medicine, talk to your doctor, pharmacist, orhealth care provider.  2022 Elsevier/Gold Standard (2019-05-26 21:44:53)  

## 2021-01-01 NOTE — Progress Notes (Signed)
Per Dr. Alvy Bimler, free T4 and TSH not needed today.

## 2021-01-05 ENCOUNTER — Encounter: Payer: Self-pay | Admitting: Hematology and Oncology

## 2021-01-25 ENCOUNTER — Telehealth: Payer: Self-pay | Admitting: Hematology and Oncology

## 2021-01-25 ENCOUNTER — Inpatient Hospital Stay: Payer: Medicare Other | Attending: Hematology and Oncology

## 2021-01-25 ENCOUNTER — Other Ambulatory Visit: Payer: Self-pay

## 2021-01-25 ENCOUNTER — Inpatient Hospital Stay (HOSPITAL_BASED_OUTPATIENT_CLINIC_OR_DEPARTMENT_OTHER): Payer: Medicare Other | Admitting: Hematology and Oncology

## 2021-01-25 ENCOUNTER — Encounter: Payer: Self-pay | Admitting: Hematology and Oncology

## 2021-01-25 ENCOUNTER — Inpatient Hospital Stay: Payer: Medicare Other

## 2021-01-25 DIAGNOSIS — C541 Malignant neoplasm of endometrium: Secondary | ICD-10-CM

## 2021-01-25 DIAGNOSIS — E039 Hypothyroidism, unspecified: Secondary | ICD-10-CM | POA: Insufficient documentation

## 2021-01-25 DIAGNOSIS — Z5112 Encounter for antineoplastic immunotherapy: Secondary | ICD-10-CM | POA: Diagnosis present

## 2021-01-25 DIAGNOSIS — C787 Secondary malignant neoplasm of liver and intrahepatic bile duct: Secondary | ICD-10-CM

## 2021-01-25 DIAGNOSIS — E663 Overweight: Secondary | ICD-10-CM | POA: Diagnosis not present

## 2021-01-25 DIAGNOSIS — Z79899 Other long term (current) drug therapy: Secondary | ICD-10-CM | POA: Insufficient documentation

## 2021-01-25 LAB — CBC WITH DIFFERENTIAL/PLATELET
Abs Immature Granulocytes: 0.02 10*3/uL (ref 0.00–0.07)
Basophils Absolute: 0.1 10*3/uL (ref 0.0–0.1)
Basophils Relative: 1 %
Eosinophils Absolute: 1.4 10*3/uL — ABNORMAL HIGH (ref 0.0–0.5)
Eosinophils Relative: 20 %
HCT: 35.1 % — ABNORMAL LOW (ref 36.0–46.0)
Hemoglobin: 11.9 g/dL — ABNORMAL LOW (ref 12.0–15.0)
Immature Granulocytes: 0 %
Lymphocytes Relative: 22 %
Lymphs Abs: 1.6 10*3/uL (ref 0.7–4.0)
MCH: 28.3 pg (ref 26.0–34.0)
MCHC: 33.9 g/dL (ref 30.0–36.0)
MCV: 83.6 fL (ref 80.0–100.0)
Monocytes Absolute: 0.4 10*3/uL (ref 0.1–1.0)
Monocytes Relative: 6 %
Neutro Abs: 3.7 10*3/uL (ref 1.7–7.7)
Neutrophils Relative %: 51 %
Platelets: 187 10*3/uL (ref 150–400)
RBC: 4.2 MIL/uL (ref 3.87–5.11)
RDW: 14.2 % (ref 11.5–15.5)
WBC: 7.1 10*3/uL (ref 4.0–10.5)
nRBC: 0 % (ref 0.0–0.2)

## 2021-01-25 LAB — CMP (CANCER CENTER ONLY)
ALT: 20 U/L (ref 0–44)
AST: 21 U/L (ref 15–41)
Albumin: 3.8 g/dL (ref 3.5–5.0)
Alkaline Phosphatase: 108 U/L (ref 38–126)
Anion gap: 8 (ref 5–15)
BUN: 23 mg/dL (ref 8–23)
CO2: 28 mmol/L (ref 22–32)
Calcium: 9.3 mg/dL (ref 8.9–10.3)
Chloride: 103 mmol/L (ref 98–111)
Creatinine: 0.66 mg/dL (ref 0.44–1.00)
GFR, Estimated: >60 mL/min (ref >60)
Glucose, Bld: 96 mg/dL (ref 70–99)
Potassium: 3.9 mmol/L (ref 3.5–5.1)
Sodium: 139 mmol/L (ref 135–145)
Total Bilirubin: 0.6 mg/dL (ref 0.3–1.2)
Total Protein: 6.6 g/dL (ref 6.5–8.1)

## 2021-01-25 LAB — TSH: TSH: 3.996 u[IU]/mL — ABNORMAL HIGH (ref 0.308–3.960)

## 2021-01-25 LAB — T4, FREE: Free T4: 1.46 ng/dL — ABNORMAL HIGH (ref 0.61–1.12)

## 2021-01-25 MED ORDER — SODIUM CHLORIDE 0.9 % IV SOLN
Freq: Once | INTRAVENOUS | Status: AC
  Filled 2021-01-25: qty 250

## 2021-01-25 MED ORDER — HEPARIN SOD (PORK) LOCK FLUSH 100 UNIT/ML IV SOLN
500.0000 [IU] | Freq: Once | INTRAVENOUS | Status: AC | PRN
  Administered 2021-01-25: 500 [IU]
  Filled 2021-01-25: qty 5

## 2021-01-25 MED ORDER — SODIUM CHLORIDE 0.9% FLUSH
10.0000 mL | INTRAVENOUS | Status: DC | PRN
  Administered 2021-01-25: 10 mL
  Filled 2021-01-25: qty 10

## 2021-01-25 MED ORDER — SODIUM CHLORIDE 0.9 % IV SOLN
200.0000 mg | Freq: Once | INTRAVENOUS | Status: AC
  Administered 2021-01-25: 200 mg via INTRAVENOUS
  Filled 2021-01-25: qty 8

## 2021-01-25 MED ORDER — SODIUM CHLORIDE 0.9% FLUSH
10.0000 mL | Freq: Once | INTRAVENOUS | Status: AC
  Administered 2021-01-25: 10 mL
  Filled 2021-01-25: qty 10

## 2021-01-25 NOTE — Patient Instructions (Signed)
[facility]  ° Discharge Instructions: °Thank you for choosing [facility] to provide your oncology and hematology care.  ° °If you have a lab appointment with the Cancer Center, please go directly to the Cancer Center and check in at the registration area. °  °Wear comfortable clothing and clothing appropriate for easy access to any Portacath or PICC line.  ° °We strive to give you quality time with your provider. You may need to reschedule your appointment if you arrive late (15 or more minutes).  Arriving late affects you and other patients whose appointments are after yours.  Also, if you miss three or more appointments without notifying the office, you may be dismissed from the clinic at the provider’s discretion.    °  °For prescription refill requests, have your pharmacy contact our office and allow 72 hours for refills to be completed.   ° °Today you received the following chemotherapy and/or immunotherapy agents: Pembrolizumab (Keytruda) °  °To help prevent nausea and vomiting after your treatment, we encourage you to take your nausea medication as directed. ° °BELOW ARE SYMPTOMS THAT SHOULD BE REPORTED IMMEDIATELY: °*FEVER GREATER THAN 100.4 F (38 °C) OR HIGHER °*CHILLS OR SWEATING °*NAUSEA AND VOMITING THAT IS NOT CONTROLLED WITH YOUR NAUSEA MEDICATION °*UNUSUAL SHORTNESS OF BREATH °*UNUSUAL BRUISING OR BLEEDING °*URINARY PROBLEMS (pain or burning when urinating, or frequent urination) °*BOWEL PROBLEMS (unusual diarrhea, constipation, pain near the anus) °TENDERNESS IN MOUTH AND THROAT WITH OR WITHOUT PRESENCE OF ULCERS (sore throat, sores in mouth, or a toothache) °UNUSUAL RASH, SWELLING OR PAIN  °UNUSUAL VAGINAL DISCHARGE OR ITCHING  ° °Items with * indicate a potential emergency and should be followed up as soon as possible or go to the Emergency Department if any problems should occur. ° °Please show the CHEMOTHERAPY ALERT CARD or IMMUNOTHERAPY ALERT CARD  at check-in to the Emergency Department and triage nurse. ° °Should you have questions after your visit or need to cancel or reschedule your appointment, please contact [facility]  Dept: [phone]  and follow the prompts.  Office hours are 8:00 a.m. to 4:30 p.m. Monday - Friday. Please note that voicemails left after 4:00 p.m. may not be returned until the following business day.  We are closed weekends and major holidays. You have access to a nurse at all times for urgent questions. Please call the main number to the clinic Dept: [phone] and follow the prompts. ° ° °For any non-urgent questions, you may also contact your provider using MyChart. We now offer e-Visits for anyone 18 and older to request care online for non-urgent symptoms. For details visit [email or web address]. °  °Also download the MyChart app! Go to the app store, search "MyChart", open the app, select [facility], and log in with your MyChart username and password. ° °Due to Covid, a mask is required upon entering the hospital/clinic. If you do not have a mask, one will be given to you upon arrival. For doctor visits, patients may have 1 support person aged 18 or older with them. For treatment visits, patients cannot have anyone with them due to current Covid guidelines and our immunocompromised population.  ° °

## 2021-01-25 NOTE — Progress Notes (Signed)
[place] OFFICE PROGRESS NOTE  Patient Care Team: [name], MD as PCP - General (Family Medicine)  ASSESSMENT & PLAN:  Recurrent carcinoma of endometrium [facility]) Her last CT imaging showed no signs of disease recurrence or relapse The plan will be to continue pembrolizumab indefinitely She has tolerated treatment very well I will plan to order next CT imaging in September  Acquired hypothyroidism Her TSH is stable She will continue her prescribed dose of Synthroid  Overweight (BMI 25.0-29.9) She has successfully lost 12 pounds of weight since last time I saw her I congratulated her effort and encouraged the patient to continue dietary modification  Orders Placed This Encounter  Procedures   CT ABDOMEN PELVIS W CONTRAST    Standing Status:   Future    Standing Expiration Date:   01/25/2022    Order Specific Question:   If indicated for the ordered procedure, I authorize the administration of contrast media per Radiology protocol    Answer:   Yes    Order Specific Question:   Preferred imaging location?    Answer:   [facility]    Order Specific Question:   Radiology Contrast Protocol - do NOT remove file path    Answer:   [email or web address]    All questions were answered. The patient knows to call the clinic with any problems, questions or concerns. The total time spent in the appointment was 20 minutes encounter with patients including review of chart and various tests results, discussions about plan of care and coordination of care plan   Heath Lark, MD 01/25/2021 1:17 PM  INTERVAL HISTORY: Please see below for problem oriented charting. She returns for treatment and follow-up She is doing well She have lost 12 pounds of weight since last time I saw her through aggressive dietary modification and exercise She felt better She has less knee pain She has no recent side effects from treatment  SUMMARY OF  ONCOLOGIC HISTORY: Oncology History Overview Note  MSI stable on June 2017 tissue but high from [facility] study from November 2017  05/15/16: ER is moderately positive (70%). PR is strongly positive (80%).   Recurrent carcinoma of endometrium ([place])  01/02/2016 Pathology Results   Uterus +/- tubes/ovaries, neoplastic, cervix ENDOMETRIAL ADENOCARCINOMA, FIGO GRADE 2 (4.7 CM) THE TUMOR INVADES LESS THAN ONE-HALF OF THE MYOMETRIUM (PT1A) ALL MARGINS OF RESECTION ARE NEGATIVE FOR CARCINOMA LEIOMYOMAS AND ADENOMYOSIS BILATERAL FALLOPIAN TUBES AND OVARIES: HISTOLOGICAL UNREMARKABLE 2. Lymph node, sentinel, biopsy, right obturator ONE BENIGN LYMPH NODE (0/1) 3. Lymph nodes, regional resection, left pelvic FOUR BENIGN LYMPH NODES (0/4)    01/02/2016 Surgery   Dr. Denman George performed robotic-assisted laparoscopic total hysterectomy with bilateral salpingoophorectomy, sentinel lymph node biopsy, lymphadenectomy       05/15/2016 Pathology Results   Vagina, biopsy, mid - ADENOCARCINOMA, SEE COMMENT. Microscopic Comment The morphology along with the patient's history are consistent with recurrent endometrioid adenocarcinoma. The carcinoma has a similar appearance to the primary ([id number]).    05/20/2016 Imaging   Ct scan abdomen showed solid 2.5 cm peritoneal mass in the mid to anterior left pelvis, suspicious for peritoneal metastasis. 2. Small expansile low-attenuation filling defect in the left external iliac vein, cannot exclude a small deep venous thrombus. Consider correlation with left lower extremity venous Doppler scan. 3. Small simple fluid density structure in the left pelvic sidewall abutting the left external iliac vessels, favor a small postoperative seroma. 4. No ascites. 5. No lymphadenopathy.  No metastatic disease in  the chest. 6. Aortic atherosclerosis.    06/12/2016 PET scan   Intensely hypermetabolic 2.1 cm central pelvic peritoneal mass just to the left of midline,  consistent with peritoneal metastatic recurrence. No ascites. 2. No additional hypermetabolic sites of metastatic disease. 3. Diffuse thyroid hypermetabolism without discrete thyroid nodule, favoring thyroiditis. Recommend correlation with serum thyroid function tests.    06/24/2016 - 07/22/2016 Chemotherapy   The patient had weekly cisplatin. She has missed several doses due to infection and pancytopenia    06/24/2016 - 09/04/2016 Radiation Therapy   She completed concurrent radiation therapy Radiation treatment dates:   IMRT : 06/24/16 - 08/01/16 HDR : 08/13/16, 08/20/16, 08/27/16, 09/04/16   Site/dose:   Pelvis treated to 55 Gy in 25 fractions (simultaneous integrated boost technique) Vaginal Cuff treated to 24 Gy in 4 fractions    08/15/2016 - 12/03/2016 Chemotherapy   She received 6 cycles of carboplatin/Taxol    09/27/2016 Imaging   Interval decrease in size of previously described solid peritoneal nodule within the left anterior pelvis. Near complete resolution of previously described low-attenuation structure along the left pelvic sidewall. No evidence for metastatic disease in the chest. Aortic atherosclerosis.    12/30/2016 Imaging   Ct abdomen 1. Solitary left pelvic peritoneal implant is mildly decreased in size in the interval. 2. No new or progressive metastatic disease in the abdomen or pelvis. No ascites. 3. Aortic atherosclerosis.    04/02/2017 Imaging   Left lower quadrant peritoneal implant referenced on previous exam measures 1.7 x 1.3 cm, image 69 of series 2. Increased from 0.8 x 0.8 cm previously peer no new peritoneal implants identified.   Musculoskeletal: The degenerative disc disease noted within the lumbar spine.   IMPRESSION: 1. Solitary left pelvic peritoneal implant is increased in size in the interval. 2. No new sites of disease.  No ascites. 3. Aortic atherosclerosis    04/11/2017 PET scan   1. The left side of pelvis peritoneal implant has decreased  in size and degree of FDG uptake compatible with response to therapy. No new areas of peritoneal disease identified. 2. Persistent diffuse increased uptake within the thyroid gland. Correlation with patient's thyroid function may be helpful.    05/26/2017 Imaging   1. Enlarging tumor implant along the left adnexa, currently 2.6 by 2.4 cm and previously 1.9 by 1.3 cm. No new tumor implant or other specific cause for the patient's pelvic symptoms is currently identified. 2.  Aortic Atherosclerosis (ICD10-I70.0). 3. Lumbar spondylosis and degenerative disc disease causing multilevel impingement.    06/04/2017 - 08/28/2017 Chemotherapy   The patient started letrozole and everolimus    08/26/2017 Imaging   Increased size of mass in the left adnexal region.  Several new small liver metastases in right hepatic lobe    09/08/2017 Imaging   LV EF: 60% -   65%    09/10/2017 Procedure   Technically successful right IJ power-injectable port catheter placement. Ready for routine use    12/04/2017 Imaging   1. Interval increase in size and number of multiple lesions within the liver compatible with hepatic metastatic disease. 2. Interval increase in size mass within the left hemipelvis.    12/15/2017 -  Chemotherapy   The patient had pembrolizumab (KEYTRUDA) 200 mg in sodium chloride 0.9 % 50 mL chemo infusion, 200 mg, Intravenous, Once, 1 of 6 cycles Administration: 200 mg (12/15/2017)   for chemotherapy treatment.     12/21/2017 Imaging   1. Moderate left hydronephrosis and hydroureter, similar compared to  most recent CT from May 2019; obstruction appears to be secondary to a left pelvic mass/metastatic focus which has increased in size since the prior CT. 2. Numerous hepatic metastatic lesions, suspect that some may be increased in size but difficult to further characterize without intravenous contrast. Increased size of left pelvic soft tissue mass/metastatic lesion. Mass abuts and possibly  invades the sigmoid colon.      02/20/2018 Imaging   Interval decrease in hepatic metastases.  Decreased mass or lymphadenopathy in the left external iliac chain. Interval resolution of left hydroureteronephrosis.  No new or progressive disease within the abdomen or pelvis.    05/16/2018 Imaging   05/16/2018 CT Abdomen IMPRESSION: 1. Slight interval decrease in size of hepatic metastatic disease. 2. Slight interval decrease in size centrally necrotic mass within the left external iliac region.   09/18/2018 Imaging   1. Today's study demonstrates a mixed response to therapy. Specifically, while the previously noted hepatic metastases appear decreased, the soft tissue mass along the left pelvic sidewall appears slightly larger than the prior study. No new metastatic disease is noted elsewhere in the abdomen or pelvis. 2. Aortic atherosclerosis. 3. Mild cardiomegaly. 4. Additional incidental findings, as above      03/12/2019 Imaging   1. Overall improvement with reduced size of the hepatic metastatic lesions, and reduced size of the irregular soft tissue mass in the left pelvis. 2. Other imaging findings of potential clinical significance: Mild distal esophageal wall thickening, cannot exclude esophagitis. Aortic Atherosclerosis (ICD10-I70.0). Lumbar impingement at L5-S1.     09/09/2019 Imaging   1. Stable size and appearance of the somewhat irregular left adnexal mass, currently 2.8 by 1.9 cm. 2. The scattered hepatic metastatic lesions are stable from 03/12/2019, and represent effectively treated metastatic lesions. 3. Other imaging findings of potential clinical significance: Airway thickening is present, suggesting bronchitis or reactive airways disease. Lumbar spondylosis and degenerative disc disease causing impingement at L4-5 and L5-S1. Small umbilical hernia contains adipose tissue.   Aortic Atherosclerosis (ICD10-I70.0).   03/07/2020 Imaging   1. Stable appearance of LEFT  pelvic mass without new signs of disease. Mass remains closely approximated to the colon, in the vicinity of the distal LEFT ureter and the iliac vessels. 2. Stable signs of hepatic metastatic disease with calcified lesion at the dome of the liver and subtle areas of low attenuation elsewhere which show no change.   09/12/2020 Imaging   1. Stable appearance of the partially calcified LEFT pelvic sidewall soft tissue mass with unchanged involvement of the sigmoid colon, iliac vessels and close proximity to the left ureter. No new signs of disease. 2. Stable signs of the hepatic metastatic disease with calcified lesion at the hepatic dome and subcentimeter area of low attenuation in the right hepatic lobe. 3. No evidence of new or enlarging abdominopelvic metastatic disease. 4. Aortic atherosclerosis.     REVIEW OF SYSTEMS:   Constitutional: Denies fevers, chills or abnormal weight loss Eyes: Denies blurriness of vision Ears, nose, mouth, throat, and face: Denies mucositis or sore throat Respiratory: Denies cough, dyspnea or wheezes Cardiovascular: Denies palpitation, chest discomfort or lower extremity swelling Gastrointestinal:  Denies nausea, heartburn or change in bowel habits Skin: Denies abnormal skin rashes Lymphatics: Denies new lymphadenopathy or easy bruising Neurological:Denies numbness, tingling or new weaknesses Behavioral/Psych: Mood is stable, no new changes  All other systems were reviewed with the patient and are negative.  I have reviewed the past medical history, past surgical history, social history and family history with  the patient and they are unchanged from previous note.  ALLERGIES:  is allergic to latex and lisinopril.  MEDICATIONS:  Current Outpatient Medications  Medication Sig Dispense Refill   acetaminophen (TYLENOL) 325 MG tablet Take 650 mg by mouth every 6 (six) hours as needed for mild pain or moderate pain.      amoxicillin (AMOXIL) 500 MG capsule Take  2,000 mg by mouth See admin instructions. Prior to dental appointment, takes 4 tables prior to appt     Biotin w/ Vitamins C & E (HAIR/SKIN/NAILS PO) Take 1 tablet by mouth daily.     Calcium Carb-Cholecalciferol (CALCIUM 600 + D PO) Take 2 tablets by mouth daily.     fluticasone (FLONASE) 50 MCG/ACT nasal spray Place 1 spray into both nostrils 2 (two) times daily as needed for allergies.   1   gabapentin (NEURONTIN) 600 MG tablet TAKE 1 TABLET BY MOUTH TWICE A DAY FOR RESTLESS LEGS AND NEUROPATHY. 60 tablet 11   levothyroxine (SYNTHROID) 200 MCG tablet Take 1 tablet (200 mcg total) by mouth daily. 90 tablet 3   lidocaine-prilocaine (EMLA) cream APPLY TO AFFECTED AREA ONCE AS DIRECTED 30 g 3   loratadine (CLARITIN) 10 MG tablet Take 10 mg by mouth daily.     LORazepam (ATIVAN) 1 MG tablet Take 1 tablet (1 mg total) by mouth every 8 (eight) hours as needed for anxiety or sleep. 90 tablet 0   losartan-hydrochlorothiazide (HYZAAR) 100-12.5 MG tablet Take 0.5 tablets by mouth daily. 60 tablet 9   magnesium oxide (MAG-OX) 400 MG tablet Take 1 tablet by mouth 2 (two) times daily.     Melatonin 10 MG CAPS Take 10 mg by mouth at bedtime.     niacinamide 500 MG tablet Take 500 mg by mouth 3 (three) times daily with meals.     ondansetron (ZOFRAN) 8 MG tablet Take 1 tablet (8 mg total) by mouth every 8 (eight) hours as needed for nausea. 60 tablet 1   Oxycodone HCl 10 MG TABS Take 1 tablet (10 mg total) by mouth 2 (two) times daily as needed. 30 tablet 0   pramipexole (MIRAPEX) 0.5 MG tablet Take 1 mg by mouth at bedtime.     No current facility-administered medications for this visit.   Facility-Administered Medications Ordered in Other Visits  Medication Dose Route Frequency Provider Last Rate Last Admin   heparin lock flush 100 unit/mL  500 Units Intracatheter Once PRN Alvy Bimler, Patrcia Schnepp, MD       pembrolizumab (KEYTRUDA) 200 mg in sodium chloride 0.9 % 50 mL chemo infusion  200 mg Intravenous Once Alvy Bimler,  Diontay Rosencrans, MD       sodium chloride flush (NS) 0.9 % injection 10 mL  10 mL Intracatheter PRN Alvy Bimler, Teliyah Royal, MD        PHYSICAL EXAMINATION: ECOG PERFORMANCE STATUS: 0 - Asymptomatic  Vitals:   01/25/21 1208  BP: (!) 142/82  Pulse: 74  Resp: 18  Temp: 97.7 F (36.5 C)  SpO2: 98%   Filed Weights   01/25/21 1208  Weight: 204 lb 3.2 oz (92.6 kg)    GENERAL:alert, no distress and comfortable SKIN: skin color, texture, turgor are normal, no rashes or significant lesions EYES: normal, Conjunctiva are pink and non-injected, sclera clear OROPHARYNX:no exudate, no erythema and lips, buccal mucosa, and tongue normal  NECK: supple, thyroid normal size, non-tender, without nodularity LYMPH:  no palpable lymphadenopathy in the cervical, axillary or inguinal LUNGS: clear to auscultation and percussion with normal breathing effort HEART:  regular rate & rhythm and no murmurs and no lower extremity edema ABDOMEN:abdomen soft, non-tender and normal bowel sounds Musculoskeletal:no cyanosis of digits and no clubbing  NEURO: alert & oriented x 3 with fluent speech, no focal motor/sensory deficits  LABORATORY DATA:  I have reviewed the data as listed    Component Value Date/Time   NA 139 01/25/2021 1149   NA 141 07/09/2017 1312   K 3.9 01/25/2021 1149   K 3.9 07/09/2017 1312   CL 103 01/25/2021 1149   CO2 28 01/25/2021 1149   CO2 27 07/09/2017 1312   GLUCOSE 96 01/25/2021 1149   GLUCOSE 109 07/09/2017 1312   BUN 23 01/25/2021 1149   BUN 16.4 07/09/2017 1312   CREATININE 0.66 01/25/2021 1149   CREATININE 0.8 07/09/2017 1312   CALCIUM 9.3 01/25/2021 1149   CALCIUM 9.2 07/09/2017 1312   PROT 6.6 01/25/2021 1149   PROT 7.1 07/09/2017 1312   ALBUMIN 3.8 01/25/2021 1149   ALBUMIN 3.1 (L) 07/09/2017 1312   AST 21 01/25/2021 1149   AST 35 (H) 07/09/2017 1312   ALT 20 01/25/2021 1149   ALT 43 07/09/2017 1312   ALKPHOS 108 01/25/2021 1149   ALKPHOS 182 (H) 07/09/2017 1312   BILITOT 0.6  01/25/2021 1149   BILITOT 0.34 07/09/2017 1312   GFRNONAA >60 01/25/2021 1149   GFRAA >60 03/29/2020 1105   GFRAA >60 01/04/2019 1351    No results found for: SPEP, UPEP  Lab Results  Component Value Date   WBC 7.1 01/25/2021   NEUTROABS 3.7 01/25/2021   HGB 11.9 (L) 01/25/2021   HCT 35.1 (L) 01/25/2021   MCV 83.6 01/25/2021   PLT 187 01/25/2021      Chemistry      Component Value Date/Time   NA 139 01/25/2021 1149   NA 141 07/09/2017 1312   K 3.9 01/25/2021 1149   K 3.9 07/09/2017 1312   CL 103 01/25/2021 1149   CO2 28 01/25/2021 1149   CO2 27 07/09/2017 1312   BUN 23 01/25/2021 1149   BUN 16.4 07/09/2017 1312   CREATININE 0.66 01/25/2021 1149   CREATININE 0.8 07/09/2017 1312      Component Value Date/Time   CALCIUM 9.3 01/25/2021 1149   CALCIUM 9.2 07/09/2017 1312   ALKPHOS 108 01/25/2021 1149   ALKPHOS 182 (H) 07/09/2017 1312   AST 21 01/25/2021 1149   AST 35 (H) 07/09/2017 1312   ALT 20 01/25/2021 1149   ALT 43 07/09/2017 1312   BILITOT 0.6 01/25/2021 1149   BILITOT 0.34 07/09/2017 1312

## 2021-01-25 NOTE — Assessment & Plan Note (Signed)
Her last CT imaging showed no signs of disease recurrence or relapse The plan will be to continue pembrolizumab indefinitely She has tolerated treatment very well I will plan to order next CT imaging in September

## 2021-01-25 NOTE — Assessment & Plan Note (Signed)
Her TSH is stable She will continue her prescribed dose of Synthroid

## 2021-01-25 NOTE — Telephone Encounter (Signed)
Scheduled appointment per 07/21 sch msg. Patient is aware. 

## 2021-01-25 NOTE — Assessment & Plan Note (Signed)
She has successfully lost 12 pounds of weight since last time I saw her I congratulated her effort and encouraged the patient to continue dietary modification

## 2021-02-22 ENCOUNTER — Other Ambulatory Visit: Payer: Self-pay

## 2021-02-22 ENCOUNTER — Telehealth: Payer: Self-pay

## 2021-02-22 ENCOUNTER — Inpatient Hospital Stay: Payer: Medicare Other | Attending: Hematology and Oncology

## 2021-02-22 ENCOUNTER — Inpatient Hospital Stay: Payer: Medicare Other

## 2021-02-22 VITALS — BP 152/88 | HR 69 | Temp 97.8°F | Resp 18 | Wt 207.8 lb

## 2021-02-22 DIAGNOSIS — Z79899 Other long term (current) drug therapy: Secondary | ICD-10-CM | POA: Insufficient documentation

## 2021-02-22 DIAGNOSIS — Z9221 Personal history of antineoplastic chemotherapy: Secondary | ICD-10-CM | POA: Diagnosis not present

## 2021-02-22 DIAGNOSIS — C541 Malignant neoplasm of endometrium: Secondary | ICD-10-CM

## 2021-02-22 DIAGNOSIS — Z5112 Encounter for antineoplastic immunotherapy: Secondary | ICD-10-CM | POA: Diagnosis not present

## 2021-02-22 DIAGNOSIS — E039 Hypothyroidism, unspecified: Secondary | ICD-10-CM | POA: Diagnosis not present

## 2021-02-22 DIAGNOSIS — Z923 Personal history of irradiation: Secondary | ICD-10-CM | POA: Diagnosis not present

## 2021-02-22 DIAGNOSIS — C787 Secondary malignant neoplasm of liver and intrahepatic bile duct: Secondary | ICD-10-CM

## 2021-02-22 LAB — COMPREHENSIVE METABOLIC PANEL
ALT: 18 U/L (ref 0–44)
AST: 23 U/L (ref 15–41)
Albumin: 3.8 g/dL (ref 3.5–5.0)
Alkaline Phosphatase: 142 U/L — ABNORMAL HIGH (ref 38–126)
Anion gap: 11 (ref 5–15)
BUN: 27 mg/dL — ABNORMAL HIGH (ref 8–23)
CO2: 22 mmol/L (ref 22–32)
Calcium: 10.1 mg/dL (ref 8.9–10.3)
Chloride: 104 mmol/L (ref 98–111)
Creatinine, Ser: 0.82 mg/dL (ref 0.44–1.00)
GFR, Estimated: >60 mL/min (ref >60)
Glucose, Bld: 83 mg/dL (ref 70–99)
Potassium: 4 mmol/L (ref 3.5–5.1)
Sodium: 137 mmol/L (ref 135–145)
Total Bilirubin: 0.6 mg/dL (ref 0.3–1.2)
Total Protein: 7.4 g/dL (ref 6.5–8.1)

## 2021-02-22 LAB — CBC WITH DIFFERENTIAL/PLATELET
Abs Immature Granulocytes: 0.02 10*3/uL (ref 0.00–0.07)
Basophils Absolute: 0.1 10*3/uL (ref 0.0–0.1)
Basophils Relative: 1 %
Eosinophils Absolute: 1.2 10*3/uL — ABNORMAL HIGH (ref 0.0–0.5)
Eosinophils Relative: 13 %
HCT: 37.5 % (ref 36.0–46.0)
Hemoglobin: 12.4 g/dL (ref 12.0–15.0)
Immature Granulocytes: 0 %
Lymphocytes Relative: 23 %
Lymphs Abs: 2.1 10*3/uL (ref 0.7–4.0)
MCH: 27.6 pg (ref 26.0–34.0)
MCHC: 33.1 g/dL (ref 30.0–36.0)
MCV: 83.5 fL (ref 80.0–100.0)
Monocytes Absolute: 0.6 10*3/uL (ref 0.1–1.0)
Monocytes Relative: 6 %
Neutro Abs: 5.3 10*3/uL (ref 1.7–7.7)
Neutrophils Relative %: 57 %
Platelets: 201 10*3/uL (ref 150–400)
RBC: 4.49 MIL/uL (ref 3.87–5.11)
RDW: 14 % (ref 11.5–15.5)
WBC: 9.3 10*3/uL (ref 4.0–10.5)
nRBC: 0 % (ref 0.0–0.2)

## 2021-02-22 LAB — TSH: TSH: 15.3 u[IU]/mL — ABNORMAL HIGH (ref 0.308–3.960)

## 2021-02-22 LAB — T4, FREE: Free T4: 0.92 ng/dL (ref 0.61–1.12)

## 2021-02-22 MED ORDER — SODIUM CHLORIDE 0.9 % IV SOLN: Freq: Once | INTRAVENOUS | Status: AC

## 2021-02-22 MED ORDER — HEPARIN SOD (PORK) LOCK FLUSH 100 UNIT/ML IV SOLN
500.0000 [IU] | Freq: Once | INTRAVENOUS | Status: AC | PRN
  Administered 2021-02-22: 500 [IU]

## 2021-02-22 MED ORDER — SODIUM CHLORIDE 0.9% FLUSH
10.0000 mL | Freq: Once | INTRAVENOUS | Status: AC
  Administered 2021-02-22: 10 mL

## 2021-02-22 MED ORDER — SODIUM CHLORIDE 0.9% FLUSH
10.0000 mL | INTRAVENOUS | Status: DC | PRN
  Administered 2021-02-22: 10 mL

## 2021-02-22 MED ORDER — SODIUM CHLORIDE 0.9 % IV SOLN
200.0000 mg | Freq: Once | INTRAVENOUS | Status: AC
  Administered 2021-02-22: 200 mg via INTRAVENOUS
  Filled 2021-02-22: qty 8

## 2021-02-22 NOTE — Telephone Encounter (Signed)
Called back and given below message. She vebralzied understanding. She thinks that she has 25 mcg Synthroid.  She will call the office back if Rx needed.

## 2021-02-22 NOTE — Telephone Encounter (Signed)
-----   Message from Heath Lark, MD sent at 02/22/2021  3:11 PM EDT ----- Did she miss any synthroid? Is she taking synthroid 30 mins before breakfast?

## 2021-02-22 NOTE — Telephone Encounter (Signed)
Called and given below message. She verbalized understanding. She takes Synthroid 1 hour before breakfast. The only dose she missed was yesterday.

## 2021-02-22 NOTE — Telephone Encounter (Signed)
Her TSH is elevated, I need to increase from 200 to 225 mcg Does she has any 25 mcg at home? If not, please send to pharmacy

## 2021-02-22 NOTE — Patient Instructions (Signed)
[facility]  ° Discharge Instructions: °Thank you for choosing [facility] to provide your oncology and hematology care.  ° °If you have a lab appointment with the Cancer Center, please go directly to the Cancer Center and check in at the registration area. °  °Wear comfortable clothing and clothing appropriate for easy access to any Portacath or PICC line.  ° °We strive to give you quality time with your provider. You may need to reschedule your appointment if you arrive late (15 or more minutes).  Arriving late affects you and other patients whose appointments are after yours.  Also, if you miss three or more appointments without notifying the office, you may be dismissed from the clinic at the provider’s discretion.    °  °For prescription refill requests, have your pharmacy contact our office and allow 72 hours for refills to be completed.   ° °Today you received the following chemotherapy and/or immunotherapy agents: Pembrolizumab (Keytruda) °  °To help prevent nausea and vomiting after your treatment, we encourage you to take your nausea medication as directed. ° °BELOW ARE SYMPTOMS THAT SHOULD BE REPORTED IMMEDIATELY: °*FEVER GREATER THAN 100.4 F (38 °C) OR HIGHER °*CHILLS OR SWEATING °*NAUSEA AND VOMITING THAT IS NOT CONTROLLED WITH YOUR NAUSEA MEDICATION °*UNUSUAL SHORTNESS OF BREATH °*UNUSUAL BRUISING OR BLEEDING °*URINARY PROBLEMS (pain or burning when urinating, or frequent urination) °*BOWEL PROBLEMS (unusual diarrhea, constipation, pain near the anus) °TENDERNESS IN MOUTH AND THROAT WITH OR WITHOUT PRESENCE OF ULCERS (sore throat, sores in mouth, or a toothache) °UNUSUAL RASH, SWELLING OR PAIN  °UNUSUAL VAGINAL DISCHARGE OR ITCHING  ° °Items with * indicate a potential emergency and should be followed up as soon as possible or go to the Emergency Department if any problems should occur. ° °Please show the CHEMOTHERAPY ALERT CARD or IMMUNOTHERAPY ALERT CARD  at check-in to the Emergency Department and triage nurse. ° °Should you have questions after your visit or need to cancel or reschedule your appointment, please contact [facility]  Dept: [phone]  and follow the prompts.  Office hours are 8:00 a.m. to 4:30 p.m. Monday - Friday. Please note that voicemails left after 4:00 p.m. may not be returned until the following business day.  We are closed weekends and major holidays. You have access to a nurse at all times for urgent questions. Please call the main number to the clinic Dept: [phone] and follow the prompts. ° ° °For any non-urgent questions, you may also contact your provider using MyChart. We now offer e-Visits for anyone 18 and older to request care online for non-urgent symptoms. For details visit [email or web address]. °  °Also download the MyChart app! Go to the app store, search "MyChart", open the app, select [facility], and log in with your MyChart username and password. ° °Due to Covid, a mask is required upon entering the hospital/clinic. If you do not have a mask, one will be given to you upon arrival. For doctor visits, patients may have 1 support person aged 18 or older with them. For treatment visits, patients cannot have anyone with them due to current Covid guidelines and our immunocompromised population.  ° °

## 2021-02-23 ENCOUNTER — Telehealth: Payer: Self-pay

## 2021-02-23 ENCOUNTER — Other Ambulatory Visit: Payer: Self-pay

## 2021-02-23 MED ORDER — LEVOTHYROXINE SODIUM 25 MCG PO TABS: 25.0000 ug | ORAL_TABLET | Freq: Every day | ORAL | 2 refills | Status: DC

## 2021-02-23 NOTE — Telephone Encounter (Signed)
Returned her call. Synthroid 25 mg Rx sent to her pharmacy. She is ware to to 225 mcg total of Synthroid daily.

## 2021-03-14 ENCOUNTER — Other Ambulatory Visit: Payer: Self-pay

## 2021-03-14 ENCOUNTER — Ambulatory Visit (HOSPITAL_COMMUNITY)
Admission: RE | Admit: 2021-03-14 | Discharge: 2021-03-14 | Disposition: A | Discharge status: Home / Self Care | Payer: Medicare Other | Source: Ambulatory Visit | Attending: Hematology and Oncology | Admitting: Hematology and Oncology

## 2021-03-14 ENCOUNTER — Inpatient Hospital Stay: Payer: Medicare Other | Attending: Hematology and Oncology

## 2021-03-14 DIAGNOSIS — C541 Malignant neoplasm of endometrium: Secondary | ICD-10-CM | POA: Diagnosis present

## 2021-03-14 DIAGNOSIS — C787 Secondary malignant neoplasm of liver and intrahepatic bile duct: Secondary | ICD-10-CM | POA: Insufficient documentation

## 2021-03-14 DIAGNOSIS — E663 Overweight: Secondary | ICD-10-CM | POA: Insufficient documentation

## 2021-03-14 DIAGNOSIS — K429 Umbilical hernia without obstruction or gangrene: Secondary | ICD-10-CM | POA: Diagnosis not present

## 2021-03-14 DIAGNOSIS — M25561 Pain in right knee: Secondary | ICD-10-CM | POA: Insufficient documentation

## 2021-03-14 DIAGNOSIS — D638 Anemia in other chronic diseases classified elsewhere: Secondary | ICD-10-CM | POA: Insufficient documentation

## 2021-03-14 DIAGNOSIS — E039 Hypothyroidism, unspecified: Secondary | ICD-10-CM | POA: Insufficient documentation

## 2021-03-14 DIAGNOSIS — Z5112 Encounter for antineoplastic immunotherapy: Secondary | ICD-10-CM | POA: Insufficient documentation

## 2021-03-14 DIAGNOSIS — Z79899 Other long term (current) drug therapy: Secondary | ICD-10-CM | POA: Insufficient documentation

## 2021-03-14 LAB — CBC WITH DIFFERENTIAL/PLATELET
Abs Immature Granulocytes: 0.01 10*3/uL (ref 0.00–0.07)
Basophils Absolute: 0.1 10*3/uL (ref 0.0–0.1)
Basophils Relative: 1 %
Eosinophils Absolute: 0.5 10*3/uL (ref 0.0–0.5)
Eosinophils Relative: 7 %
HCT: 36.4 % (ref 36.0–46.0)
Hemoglobin: 11.9 g/dL — ABNORMAL LOW (ref 12.0–15.0)
Immature Granulocytes: 0 %
Lymphocytes Relative: 18 %
Lymphs Abs: 1.2 10*3/uL (ref 0.7–4.0)
MCH: 27 pg (ref 26.0–34.0)
MCHC: 32.7 g/dL (ref 30.0–36.0)
MCV: 82.7 fL (ref 80.0–100.0)
Monocytes Absolute: 0.5 10*3/uL (ref 0.1–1.0)
Monocytes Relative: 7 %
Neutro Abs: 4.6 10*3/uL (ref 1.7–7.7)
Neutrophils Relative %: 67 %
Platelets: 185 10*3/uL (ref 150–400)
RBC: 4.4 MIL/uL (ref 3.87–5.11)
RDW: 13.9 % (ref 11.5–15.5)
WBC: 6.9 10*3/uL (ref 4.0–10.5)
nRBC: 0 % (ref 0.0–0.2)

## 2021-03-14 LAB — TSH: TSH: 0.442 u[IU]/mL (ref 0.308–3.960)

## 2021-03-14 LAB — COMPREHENSIVE METABOLIC PANEL
ALT: 17 U/L (ref 0–44)
AST: 18 U/L (ref 15–41)
Albumin: 3.8 g/dL (ref 3.5–5.0)
Alkaline Phosphatase: 132 U/L — ABNORMAL HIGH (ref 38–126)
Anion gap: 11 (ref 5–15)
BUN: 20 mg/dL (ref 8–23)
CO2: 25 mmol/L (ref 22–32)
Calcium: 9.8 mg/dL (ref 8.9–10.3)
Chloride: 103 mmol/L (ref 98–111)
Creatinine, Ser: 0.79 mg/dL (ref 0.44–1.00)
GFR, Estimated: >60 mL/min (ref >60)
Glucose, Bld: 90 mg/dL (ref 70–99)
Potassium: 3.8 mmol/L (ref 3.5–5.1)
Sodium: 139 mmol/L (ref 135–145)
Total Bilirubin: 0.6 mg/dL (ref 0.3–1.2)
Total Protein: 7.2 g/dL (ref 6.5–8.1)

## 2021-03-14 LAB — T4, FREE: Free T4: 1.83 ng/dL — ABNORMAL HIGH (ref 0.61–1.12)

## 2021-03-14 MED ORDER — IOHEXOL 350 MG/ML SOLN
80.0000 mL | Freq: Once | INTRAVENOUS | Status: AC | PRN
  Administered 2021-03-14: 80 mL via INTRAVENOUS

## 2021-03-14 MED ORDER — HEPARIN SOD (PORK) LOCK FLUSH 100 UNIT/ML IV SOLN: 500.0000 [IU] | Freq: Once | INTRAVENOUS | Status: AC

## 2021-03-14 MED ORDER — SODIUM CHLORIDE 0.9% FLUSH
10.0000 mL | Freq: Once | INTRAVENOUS | Status: AC
  Administered 2021-03-14: 10 mL

## 2021-03-14 MED ORDER — HEPARIN SOD (PORK) LOCK FLUSH 100 UNIT/ML IV SOLN
INTRAVENOUS | Status: AC
  Administered 2021-03-14: 500 [IU] via INTRAVENOUS
  Filled 2021-03-14: qty 5

## 2021-03-15 ENCOUNTER — Inpatient Hospital Stay: Payer: Medicare Other

## 2021-03-15 ENCOUNTER — Inpatient Hospital Stay (HOSPITAL_BASED_OUTPATIENT_CLINIC_OR_DEPARTMENT_OTHER): Payer: Medicare Other | Admitting: Hematology and Oncology

## 2021-03-15 ENCOUNTER — Encounter: Payer: Self-pay | Admitting: Hematology and Oncology

## 2021-03-15 DIAGNOSIS — D638 Anemia in other chronic diseases classified elsewhere: Secondary | ICD-10-CM | POA: Diagnosis not present

## 2021-03-15 DIAGNOSIS — E039 Hypothyroidism, unspecified: Secondary | ICD-10-CM

## 2021-03-15 DIAGNOSIS — E663 Overweight: Secondary | ICD-10-CM | POA: Diagnosis not present

## 2021-03-15 DIAGNOSIS — Z79899 Other long term (current) drug therapy: Secondary | ICD-10-CM | POA: Diagnosis not present

## 2021-03-15 DIAGNOSIS — C787 Secondary malignant neoplasm of liver and intrahepatic bile duct: Secondary | ICD-10-CM | POA: Diagnosis present

## 2021-03-15 DIAGNOSIS — C541 Malignant neoplasm of endometrium: Secondary | ICD-10-CM

## 2021-03-15 DIAGNOSIS — M25561 Pain in right knee: Secondary | ICD-10-CM

## 2021-03-15 DIAGNOSIS — Z5112 Encounter for antineoplastic immunotherapy: Secondary | ICD-10-CM | POA: Diagnosis not present

## 2021-03-15 MED ORDER — SODIUM CHLORIDE 0.9 % IV SOLN: Freq: Once | INTRAVENOUS | Status: AC

## 2021-03-15 MED ORDER — SODIUM CHLORIDE 0.9% FLUSH
10.0000 mL | INTRAVENOUS | Status: DC | PRN
  Administered 2021-03-15: 10 mL

## 2021-03-15 MED ORDER — HEPARIN SOD (PORK) LOCK FLUSH 100 UNIT/ML IV SOLN
500.0000 [IU] | Freq: Once | INTRAVENOUS | Status: AC | PRN
  Administered 2021-03-15: 500 [IU]

## 2021-03-15 MED ORDER — LORAZEPAM 1 MG PO TABS: 1.0000 mg | ORAL_TABLET | Freq: Three times a day (TID) | ORAL | 0 refills | Status: DC | PRN

## 2021-03-15 MED ORDER — LEVOTHYROXINE SODIUM 25 MCG PO TABS: 25.0000 ug | ORAL_TABLET | Freq: Every day | ORAL | 2 refills | Status: DC

## 2021-03-15 MED ORDER — SODIUM CHLORIDE 0.9 % IV SOLN
200.0000 mg | Freq: Once | INTRAVENOUS | Status: AC
  Administered 2021-03-15: 200 mg via INTRAVENOUS
  Filled 2021-03-15: qty 8

## 2021-03-15 NOTE — Assessment & Plan Note (Signed)
I have reviewed recent imaging study with the patient She has no signs of cancer progression and overall tolerated treatment very well The plan will be to continue treatment indefinitely I plan to space out her imaging study to every 9 months, next imaging would be in June 2023 She is in agreement with the plan of care

## 2021-03-15 NOTE — Assessment & Plan Note (Signed)
We discussed the importance of weight loss in the long run to reduce her risk of cancer progression

## 2021-03-15 NOTE — Assessment & Plan Note (Signed)
She had injured her knee recently There is no contraindication for her to proceed with orthopedic intervention if needed

## 2021-03-15 NOTE — Progress Notes (Signed)
[place] OFFICE PROGRESS NOTE  Patient Care Team: [name], MD as PCP - General (Family Medicine)  ASSESSMENT & PLAN:  Recurrent carcinoma of endometrium [facility]) I have reviewed recent imaging study with the patient She has no signs of cancer progression and overall tolerated treatment very well The plan will be to continue treatment indefinitely I plan to space out her imaging study to every 9 months, next imaging would be in June 2023 She is in agreement with the plan of care  Acquired hypothyroidism Since recent changes in her thyroid medicine, her TSH is somewhat suppressed and her free thyroxine level is high I plan to reduce her current dose of Synthroid and will continue to monitor her thyroid function test carefully  Anemia, chronic disease She has very mild intermittent anemia of chronic illness Observe closely for now  Overweight (BMI 25.0-29.9) We discussed the importance of weight loss in the long run to reduce her risk of cancer progression  Right knee pain She had injured her knee recently There is no contraindication for her to proceed with orthopedic intervention if needed  No orders of the defined types were placed in this encounter.   All questions were answered. The patient knows to call the clinic with any problems, questions or concerns. The total time spent in the appointment was 30 minutes encounter with patients including review of chart and various tests results, discussions about plan of care and coordination of care plan   Heath Lark, MD 03/15/2021 1:56 PM  INTERVAL HISTORY: Please see below for problem oriented charting. she returns for treatment follow-up on pembrolizumab for recurrent uterine cancer She returns with Diane, a family member She injured her right knee recently Apart from that, she had no complications from treatment No recent infection, fever or chills She has appointment to see orthopedic surgeon to address  her knee problem  REVIEW OF SYSTEMS:   Constitutional: Denies fevers, chills or abnormal weight loss Eyes: Denies blurriness of vision Ears, nose, mouth, throat, and face: Denies mucositis or sore throat Respiratory: Denies cough, dyspnea or wheezes Cardiovascular: Denies palpitation, chest discomfort or lower extremity swelling Gastrointestinal:  Denies nausea, heartburn or change in bowel habits Skin: Denies abnormal skin rashes Lymphatics: Denies new lymphadenopathy or easy bruising Neurological:Denies numbness, tingling or new weaknesses Behavioral/Psych: Mood is stable, no new changes  All other systems were reviewed with the patient and are negative.  I have reviewed the past medical history, past surgical history, social history and family history with the patient and they are unchanged from previous note.  ALLERGIES:  is allergic to latex and lisinopril.  MEDICATIONS:  Current Outpatient Medications  Medication Sig Dispense Refill   acetaminophen (TYLENOL) 325 MG tablet Take 650 mg by mouth every 6 (six) hours as needed for mild pain or moderate pain.      amoxicillin (AMOXIL) 500 MG capsule Take 2,000 mg by mouth See admin instructions. Prior to dental appointment, takes 4 tables prior to appt     Biotin w/ Vitamins C & E (HAIR/SKIN/NAILS PO) Take 1 tablet by mouth daily.     Calcium Carb-Cholecalciferol (CALCIUM 600 + D PO) Take 2 tablets by mouth daily.     fluticasone (FLONASE) 50 MCG/ACT nasal spray Place 1 spray into both nostrils 2 (two) times daily as needed for allergies.   1   gabapentin (NEURONTIN) 600 MG tablet TAKE 1 TABLET BY MOUTH TWICE A DAY FOR RESTLESS LEGS AND NEUROPATHY. 60 tablet 11  levothyroxine (SYNTHROID) 200 MCG tablet Take 1 tablet (200 mcg total) by mouth daily. 90 tablet 3   levothyroxine (SYNTHROID) 25 MCG tablet Take 1 tablet (25 mcg total) by mouth daily before breakfast. Take this with the 200 mcg tablet only on Mondays, Wednesdays and Fridays 90  tablet 2   lidocaine-prilocaine (EMLA) cream APPLY TO AFFECTED AREA ONCE AS DIRECTED 30 g 3   loratadine (CLARITIN) 10 MG tablet Take 10 mg by mouth daily.     LORazepam (ATIVAN) 1 MG tablet Take 1 tablet (1 mg total) by mouth every 8 (eight) hours as needed for anxiety or sleep. 90 tablet 0   losartan-hydrochlorothiazide (HYZAAR) 100-12.5 MG tablet Take 0.5 tablets by mouth daily. 60 tablet 9   magnesium oxide (MAG-OX) 400 MG tablet Take 1 tablet by mouth 2 (two) times daily.     meclizine (ANTIVERT) 25 MG tablet Take 25 mg by mouth 3 (three) times daily. Pt has vertigo  Take for 7 days     Melatonin 10 MG CAPS Take 10 mg by mouth at bedtime.     niacinamide 500 MG tablet Take 500 mg by mouth 3 (three) times daily with meals.     ondansetron (ZOFRAN) 8 MG tablet Take 1 tablet (8 mg total) by mouth every 8 (eight) hours as needed for nausea. 60 tablet 1   Oxycodone HCl 10 MG TABS Take 1 tablet (10 mg total) by mouth 2 (two) times daily as needed. 30 tablet 0   pramipexole (MIRAPEX) 0.5 MG tablet Take 1 mg by mouth at bedtime.     No current facility-administered medications for this visit.   Facility-Administered Medications Ordered in Other Visits  Medication Dose Route Frequency Provider Last Rate Last Admin   heparin lock flush 100 unit/mL  500 Units Intracatheter Once PRN Alvy Bimler, Malaka Ruffner, MD       pembrolizumab (KEYTRUDA) 200 mg in sodium chloride 0.9 % 50 mL chemo infusion  200 mg Intravenous Once Alvy Bimler, Maryana Pittmon, MD       sodium chloride flush (NS) 0.9 % injection 10 mL  10 mL Intracatheter PRN Heath Lark, MD        SUMMARY OF ONCOLOGIC HISTORY: Oncology History Overview Note  MSI stable on June 2017 tissue but high from [facility] study from November 2017  05/15/16: ER is moderately positive (70%). PR is strongly positive (80%).   Recurrent carcinoma of endometrium ([place])  01/02/2016 Pathology Results   Uterus +/- tubes/ovaries, neoplastic, cervix ENDOMETRIAL ADENOCARCINOMA, FIGO  GRADE 2 (4.7 CM) THE TUMOR INVADES LESS THAN ONE-HALF OF THE MYOMETRIUM (PT1A) ALL MARGINS OF RESECTION ARE NEGATIVE FOR CARCINOMA LEIOMYOMAS AND ADENOMYOSIS BILATERAL FALLOPIAN TUBES AND OVARIES: HISTOLOGICAL UNREMARKABLE 2. Lymph node, sentinel, biopsy, right obturator ONE BENIGN LYMPH NODE (0/1) 3. Lymph nodes, regional resection, left pelvic FOUR BENIGN LYMPH NODES (0/4)   01/02/2016 Surgery   Dr. Denman George performed robotic-assisted laparoscopic total hysterectomy with bilateral salpingoophorectomy, sentinel lymph node biopsy, lymphadenectomy      05/15/2016 Pathology Results   Vagina, biopsy, mid - ADENOCARCINOMA, SEE COMMENT. Microscopic Comment The morphology along with the patient's history are consistent with recurrent endometrioid adenocarcinoma. The carcinoma has a similar appearance to the primary ([id number]).   05/20/2016 Imaging   Ct scan abdomen showed solid 2.5 cm peritoneal mass in the mid to anterior left pelvis, suspicious for peritoneal metastasis. 2. Small expansile low-attenuation filling defect in the left external iliac vein, cannot exclude a small deep venous thrombus. Consider correlation with left lower extremity venous  Doppler scan. 3. Small simple fluid density structure in the left pelvic sidewall abutting the left external iliac vessels, favor a small postoperative seroma. 4. No ascites. 5. No lymphadenopathy.  No metastatic disease in the chest. 6. Aortic atherosclerosis.   06/12/2016 PET scan   Intensely hypermetabolic 2.1 cm central pelvic peritoneal mass just to the left of midline, consistent with peritoneal metastatic recurrence. No ascites. 2. No additional hypermetabolic sites of metastatic disease. 3. Diffuse thyroid hypermetabolism without discrete thyroid nodule, favoring thyroiditis. Recommend correlation with serum thyroid function tests.   06/24/2016 - 07/22/2016 Chemotherapy   The patient had weekly cisplatin. She has missed several doses due to  infection and pancytopenia   06/24/2016 - 09/04/2016 Radiation Therapy   She completed concurrent radiation therapy Radiation treatment dates:   IMRT : 06/24/16 - 08/01/16 HDR : 08/13/16, 08/20/16, 08/27/16, 09/04/16   Site/dose:   Pelvis treated to 55 Gy in 25 fractions (simultaneous integrated boost technique) Vaginal Cuff treated to 24 Gy in 4 fractions   08/15/2016 - 12/03/2016 Chemotherapy   She received 6 cycles of carboplatin/Taxol   09/27/2016 Imaging   Interval decrease in size of previously described solid peritoneal nodule within the left anterior pelvis. Near complete resolution of previously described low-attenuation structure along the left pelvic sidewall. No evidence for metastatic disease in the chest. Aortic atherosclerosis.   12/30/2016 Imaging   Ct abdomen 1. Solitary left pelvic peritoneal implant is mildly decreased in size in the interval. 2. No new or progressive metastatic disease in the abdomen or pelvis. No ascites. 3. Aortic atherosclerosis.   04/02/2017 Imaging   Left lower quadrant peritoneal implant referenced on previous exam measures 1.7 x 1.3 cm, image 69 of series 2. Increased from 0.8 x 0.8 cm previously peer no new peritoneal implants identified.   Musculoskeletal: The degenerative disc disease noted within the lumbar spine.   IMPRESSION: 1. Solitary left pelvic peritoneal implant is increased in size in the interval. 2. No new sites of disease.  No ascites. 3. Aortic atherosclerosis   04/11/2017 PET scan   1. The left side of pelvis peritoneal implant has decreased in size and degree of FDG uptake compatible with response to therapy. No new areas of peritoneal disease identified. 2. Persistent diffuse increased uptake within the thyroid gland. Correlation with patient's thyroid function may be helpful.   05/26/2017 Imaging   1. Enlarging tumor implant along the left adnexa, currently 2.6 by 2.4 cm and previously 1.9 by 1.3 cm. No new tumor implant or  other specific cause for the patient's pelvic symptoms is currently identified. 2.  Aortic Atherosclerosis (ICD10-I70.0). 3. Lumbar spondylosis and degenerative disc disease causing multilevel impingement.   06/04/2017 - 08/28/2017 Chemotherapy   The patient started letrozole and everolimus   08/26/2017 Imaging   Increased size of mass in the left adnexal region.  Several new small liver metastases in right hepatic lobe   09/08/2017 Imaging   LV EF: 60% -   65%   09/10/2017 Procedure   Technically successful right IJ power-injectable port catheter placement. Ready for routine use   12/04/2017 Imaging   1. Interval increase in size and number of multiple lesions within the liver compatible with hepatic metastatic disease. 2. Interval increase in size mass within the left hemipelvis.   12/15/2017 -  Chemotherapy   The patient had pembrolizumab (KEYTRUDA) 200 mg in sodium chloride 0.9 % 50 mL chemo infusion, 200 mg, Intravenous, Once, 1 of 6 cycles Administration: 200 mg (12/15/2017)  for chemotherapy treatment.    12/21/2017 Imaging   1. Moderate left hydronephrosis and hydroureter, similar compared to most recent CT from May 2019; obstruction appears to be secondary to a left pelvic mass/metastatic focus which has increased in size since the prior CT. 2. Numerous hepatic metastatic lesions, suspect that some may be increased in size but difficult to further characterize without intravenous contrast. Increased size of left pelvic soft tissue mass/metastatic lesion. Mass abuts and possibly invades the sigmoid colon.     02/20/2018 Imaging   Interval decrease in hepatic metastases.  Decreased mass or lymphadenopathy in the left external iliac chain. Interval resolution of left hydroureteronephrosis.  No new or progressive disease within the abdomen or pelvis.   05/16/2018 Imaging   05/16/2018 CT Abdomen IMPRESSION: 1. Slight interval decrease in size of hepatic metastatic disease. 2.  Slight interval decrease in size centrally necrotic mass within the left external iliac region.   09/18/2018 Imaging   1. Today's study demonstrates a mixed response to therapy. Specifically, while the previously noted hepatic metastases appear decreased, the soft tissue mass along the left pelvic sidewall appears slightly larger than the prior study. No new metastatic disease is noted elsewhere in the abdomen or pelvis. 2. Aortic atherosclerosis. 3. Mild cardiomegaly. 4. Additional incidental findings, as above     03/12/2019 Imaging   1. Overall improvement with reduced size of the hepatic metastatic lesions, and reduced size of the irregular soft tissue mass in the left pelvis. 2. Other imaging findings of potential clinical significance: Mild distal esophageal wall thickening, cannot exclude esophagitis. Aortic Atherosclerosis (ICD10-I70.0). Lumbar impingement at L5-S1.     09/09/2019 Imaging   1. Stable size and appearance of the somewhat irregular left adnexal mass, currently 2.8 by 1.9 cm. 2. The scattered hepatic metastatic lesions are stable from 03/12/2019, and represent effectively treated metastatic lesions. 3. Other imaging findings of potential clinical significance: Airway thickening is present, suggesting bronchitis or reactive airways disease. Lumbar spondylosis and degenerative disc disease causing impingement at L4-5 and L5-S1. Small umbilical hernia contains adipose tissue.   Aortic Atherosclerosis (ICD10-I70.0).   03/07/2020 Imaging   1. Stable appearance of LEFT pelvic mass without new signs of disease. Mass remains closely approximated to the colon, in the vicinity of the distal LEFT ureter and the iliac vessels. 2. Stable signs of hepatic metastatic disease with calcified lesion at the dome of the liver and subtle areas of low attenuation elsewhere which show no change.   09/12/2020 Imaging   1. Stable appearance of the partially calcified LEFT pelvic sidewall soft tissue  mass with unchanged involvement of the sigmoid colon, iliac vessels and close proximity to the left ureter. No new signs of disease. 2. Stable signs of the hepatic metastatic disease with calcified lesion at the hepatic dome and subcentimeter area of low attenuation in the right hepatic lobe. 3. No evidence of new or enlarging abdominopelvic metastatic disease. 4. Aortic atherosclerosis.   03/15/2021 Imaging   1. Stable exam. No new or progressive interval findings. 2. Spiculated calcified soft tissue lesion along the left pelvic sidewall is stable in the interval. This lesion tethers the sigmoid colon and is in close proximity to the left ureter and left external iliac vasculature. 3. Stable appearance of the calcified lesion at the dome of the liver and hypoattenuating lesion in the right liver. 4. Small umbilical hernia contains only fat.     PHYSICAL EXAMINATION: ECOG PERFORMANCE STATUS: 2 - Symptomatic, <50% confined to bed  Vitals:  03/15/21 1301  BP: 119/81  Pulse: 79  Resp: 18  Temp: 97.6 F (36.4 C)  SpO2: 98%   Filed Weights   03/15/21 1301  Weight: 206 lb 6.4 oz (93.6 kg)    GENERAL:alert, no distress and comfortable NEURO: alert & oriented x 3 with fluent speech, no focal motor/sensory deficits  LABORATORY DATA:  I have reviewed the data as listed    Component Value Date/Time   NA 139 03/14/2021 1235   NA 141 07/09/2017 1312   K 3.8 03/14/2021 1235   K 3.9 07/09/2017 1312   CL 103 03/14/2021 1235   CO2 25 03/14/2021 1235   CO2 27 07/09/2017 1312   GLUCOSE 90 03/14/2021 1235   GLUCOSE 109 07/09/2017 1312   BUN 20 03/14/2021 1235   BUN 16.4 07/09/2017 1312   CREATININE 0.79 03/14/2021 1235   CREATININE 0.66 01/25/2021 1149   CREATININE 0.8 07/09/2017 1312   CALCIUM 9.8 03/14/2021 1235   CALCIUM 9.2 07/09/2017 1312   PROT 7.2 03/14/2021 1235   PROT 7.1 07/09/2017 1312   ALBUMIN 3.8 03/14/2021 1235   ALBUMIN 3.1 (L) 07/09/2017 1312   AST 18 03/14/2021  1235   AST 21 01/25/2021 1149   AST 35 (H) 07/09/2017 1312   ALT 17 03/14/2021 1235   ALT 20 01/25/2021 1149   ALT 43 07/09/2017 1312   ALKPHOS 132 (H) 03/14/2021 1235   ALKPHOS 182 (H) 07/09/2017 1312   BILITOT 0.6 03/14/2021 1235   BILITOT 0.6 01/25/2021 1149   BILITOT 0.34 07/09/2017 1312   GFRNONAA >60 03/14/2021 1235   GFRNONAA >60 01/25/2021 1149   GFRAA >60 03/29/2020 1105   GFRAA >60 01/04/2019 1351    No results found for: SPEP, UPEP  Lab Results  Component Value Date   WBC 6.9 03/14/2021   NEUTROABS 4.6 03/14/2021   HGB 11.9 (L) 03/14/2021   HCT 36.4 03/14/2021   MCV 82.7 03/14/2021   PLT 185 03/14/2021      Chemistry      Component Value Date/Time   NA 139 03/14/2021 1235   NA 141 07/09/2017 1312   K 3.8 03/14/2021 1235   K 3.9 07/09/2017 1312   CL 103 03/14/2021 1235   CO2 25 03/14/2021 1235   CO2 27 07/09/2017 1312   BUN 20 03/14/2021 1235   BUN 16.4 07/09/2017 1312   CREATININE 0.79 03/14/2021 1235   CREATININE 0.66 01/25/2021 1149   CREATININE 0.8 07/09/2017 1312      Component Value Date/Time   CALCIUM 9.8 03/14/2021 1235   CALCIUM 9.2 07/09/2017 1312   ALKPHOS 132 (H) 03/14/2021 1235   ALKPHOS 182 (H) 07/09/2017 1312   AST 18 03/14/2021 1235   AST 21 01/25/2021 1149   AST 35 (H) 07/09/2017 1312   ALT 17 03/14/2021 1235   ALT 20 01/25/2021 1149   ALT 43 07/09/2017 1312   BILITOT 0.6 03/14/2021 1235   BILITOT 0.6 01/25/2021 1149   BILITOT 0.34 07/09/2017 1312       RADIOGRAPHIC STUDIES: I have reviewed multiple CT imaging with the patient I have personally reviewed the radiological images as listed and agreed with the findings in the report. CT ABDOMEN PELVIS W CONTRAST  Result Date: 03/15/2021 CLINICAL DATA:  Endometrial cancer.  Restaging. EXAM: CT ABDOMEN AND PELVIS WITH CONTRAST TECHNIQUE: Multidetector CT imaging of the abdomen and pelvis was performed using the standard protocol following bolus administration of intravenous  contrast. CONTRAST:  50m OMNIPAQUE IOHEXOL 350 MG/ML SOLN COMPARISON:  09/12/2020 FINDINGS: Lower chest: Unremarkable. Hepatobiliary: Calcified lesion at the dome of the liver is stable at 6 mm today compared to 7 mm previously hypoattenuating lesion in the right liver measured previously at 9 mm is stable at 9 mm on image 16/2. No new suspicious liver lesion. There is no evidence for gallstones, gallbladder wall thickening, or pericholecystic fluid. No intrahepatic or extrahepatic biliary dilation. Pancreas: No focal mass lesion. No dilatation of the main duct. No intraparenchymal cyst. No peripancreatic edema. Spleen: No splenomegaly. No focal mass lesion. Adrenals/Urinary Tract: No adrenal nodule or mass. Kidneys unremarkable. No evidence for hydroureter. The urinary bladder appears normal for the degree of distention. Stomach/Bowel: Stomach decompressed. Duodenum is normally positioned as is the ligament of Treitz. Duodenal diverticulum noted. No small bowel wall thickening. No small bowel dilatation. The terminal ileum is normal. The appendix is not well visualized, but there is no edema or inflammation in the region of the cecum. No gross colonic mass. No colonic wall thickening. Sigmoid colon again noted to be tethered to the left pelvic sidewall (see below). Vascular/Lymphatic: No abdominal aortic aneurysm. There is no gastrohepatic or hepatoduodenal ligament lymphadenopathy. No retroperitoneal or mesenteric lymphadenopathy. No pelvic sidewall lymphadenopathy. Reproductive: Uterus surgically absent.  There is no adnexal mass. Other: No intraperitoneal free fluid. Spiculated calcified soft tissue lesion along the left pelvic sidewall measured previously at 2.9 x 1.7 cm is stable at 2.9 x 1.6 cm today (67/2). This is the lesion that tethers the sigmoid colon and is in close proximity to the left ureter and left external iliac vasculature. Musculoskeletal: No worrisome lytic or sclerotic osseous abnormality.  Degenerative disc disease noted lumbar spine. Small umbilical hernia contains only fat. IMPRESSION: 1. Stable exam. No new or progressive interval findings. 2. Spiculated calcified soft tissue lesion along the left pelvic sidewall is stable in the interval. This lesion tethers the sigmoid colon and is in close proximity to the left ureter and left external iliac vasculature. 3. Stable appearance of the calcified lesion at the dome of the liver and hypoattenuating lesion in the right liver. 4. Small umbilical hernia contains only fat. Electronically Signed   By: Misty Stanley M.D.   On: 03/15/2021 12:14

## 2021-03-15 NOTE — Assessment & Plan Note (Signed)
She has very mild intermittent anemia of chronic illness Observe closely for now

## 2021-03-15 NOTE — Assessment & Plan Note (Signed)
Since recent changes in her thyroid medicine, her TSH is somewhat suppressed and her free thyroxine level is high I plan to reduce her current dose of Synthroid and will continue to monitor her thyroid function test carefully

## 2021-04-05 ENCOUNTER — Other Ambulatory Visit: Payer: Self-pay

## 2021-04-05 ENCOUNTER — Inpatient Hospital Stay: Payer: Medicare Other

## 2021-04-05 VITALS — BP 158/83 | HR 68 | Temp 98.8°F | Resp 18 | Wt 211.8 lb

## 2021-04-05 DIAGNOSIS — C787 Secondary malignant neoplasm of liver and intrahepatic bile duct: Secondary | ICD-10-CM

## 2021-04-05 DIAGNOSIS — Z5112 Encounter for antineoplastic immunotherapy: Secondary | ICD-10-CM | POA: Diagnosis not present

## 2021-04-05 DIAGNOSIS — C541 Malignant neoplasm of endometrium: Secondary | ICD-10-CM

## 2021-04-05 DIAGNOSIS — E039 Hypothyroidism, unspecified: Secondary | ICD-10-CM

## 2021-04-05 LAB — COMPREHENSIVE METABOLIC PANEL
ALT: 15 U/L (ref 0–44)
AST: 19 U/L (ref 15–41)
Albumin: 3.6 g/dL (ref 3.5–5.0)
Alkaline Phosphatase: 119 U/L (ref 38–126)
Anion gap: 11 (ref 5–15)
BUN: 21 mg/dL (ref 8–23)
CO2: 23 mmol/L (ref 22–32)
Calcium: 9.7 mg/dL (ref 8.9–10.3)
Chloride: 104 mmol/L (ref 98–111)
Creatinine, Ser: 0.82 mg/dL (ref 0.44–1.00)
GFR, Estimated: >60 mL/min (ref >60)
Glucose, Bld: 88 mg/dL (ref 70–99)
Potassium: 4.1 mmol/L (ref 3.5–5.1)
Sodium: 138 mmol/L (ref 135–145)
Total Bilirubin: 0.6 mg/dL (ref 0.3–1.2)
Total Protein: 7 g/dL (ref 6.5–8.1)

## 2021-04-05 LAB — CBC WITH DIFFERENTIAL/PLATELET
Abs Immature Granulocytes: 0.02 10*3/uL (ref 0.00–0.07)
Basophils Absolute: 0.1 10*3/uL (ref 0.0–0.1)
Basophils Relative: 1 %
Eosinophils Absolute: 0.7 10*3/uL — ABNORMAL HIGH (ref 0.0–0.5)
Eosinophils Relative: 9 %
HCT: 35.5 % — ABNORMAL LOW (ref 36.0–46.0)
Hemoglobin: 11.7 g/dL — ABNORMAL LOW (ref 12.0–15.0)
Immature Granulocytes: 0 %
Lymphocytes Relative: 24 %
Lymphs Abs: 1.8 10*3/uL (ref 0.7–4.0)
MCH: 27 pg (ref 26.0–34.0)
MCHC: 33 g/dL (ref 30.0–36.0)
MCV: 82 fL (ref 80.0–100.0)
Monocytes Absolute: 0.5 10*3/uL (ref 0.1–1.0)
Monocytes Relative: 6 %
Neutro Abs: 4.5 10*3/uL (ref 1.7–7.7)
Neutrophils Relative %: 60 %
Platelets: 206 10*3/uL (ref 150–400)
RBC: 4.33 MIL/uL (ref 3.87–5.11)
RDW: 13.5 % (ref 11.5–15.5)
WBC: 7.5 10*3/uL (ref 4.0–10.5)
nRBC: 0 % (ref 0.0–0.2)

## 2021-04-05 LAB — TSH: TSH: 2.088 u[IU]/mL (ref 0.308–3.960)

## 2021-04-05 LAB — T4, FREE: Free T4: 1.34 ng/dL — ABNORMAL HIGH (ref 0.61–1.12)

## 2021-04-05 MED ORDER — HEPARIN SOD (PORK) LOCK FLUSH 100 UNIT/ML IV SOLN
500.0000 [IU] | Freq: Once | INTRAVENOUS | Status: AC | PRN
  Administered 2021-04-05: 500 [IU]

## 2021-04-05 MED ORDER — SODIUM CHLORIDE 0.9 % IV SOLN
200.0000 mg | Freq: Once | INTRAVENOUS | Status: AC
  Administered 2021-04-05: 200 mg via INTRAVENOUS
  Filled 2021-04-05: qty 8

## 2021-04-05 MED ORDER — SODIUM CHLORIDE 0.9% FLUSH
10.0000 mL | Freq: Once | INTRAVENOUS | Status: AC
  Administered 2021-04-05: 10 mL

## 2021-04-05 MED ORDER — SODIUM CHLORIDE 0.9% FLUSH
10.0000 mL | INTRAVENOUS | Status: DC | PRN
  Administered 2021-04-05: 10 mL

## 2021-04-05 MED ORDER — SODIUM CHLORIDE 0.9 % IV SOLN: Freq: Once | INTRAVENOUS | Status: AC

## 2021-04-05 NOTE — Patient Instructions (Signed)
[facility] MEDICAL ONCOLOGY  Discharge Instructions: ?Thank you for choosing [facility] to provide your oncology and hematology care.  ? ?If you have a lab appointment with the Cancer Center, please go directly to the Cancer Center and check in at the registration area. ?  ?Wear comfortable clothing and clothing appropriate for easy access to any Portacath or PICC line.  ? ?We strive to give you quality time with your provider. You may need to reschedule your appointment if you arrive late (15 or more minutes).  Arriving late affects you and other patients whose appointments are after yours.  Also, if you miss three or more appointments without notifying the office, you may be dismissed from the clinic at the provider?s discretion.    ?  ?For prescription refill requests, have your pharmacy contact our office and allow 72 hours for refills to be completed.   ? ?Today you received the following chemotherapy and/or immunotherapy agents: Keytruda ?  ?To help prevent nausea and vomiting after your treatment, we encourage you to take your nausea medication as directed. ? ?BELOW ARE SYMPTOMS THAT SHOULD BE REPORTED IMMEDIATELY: ?*FEVER GREATER THAN 100.4 F (38 ?C) OR HIGHER ?*CHILLS OR SWEATING ?*NAUSEA AND VOMITING THAT IS NOT CONTROLLED WITH YOUR NAUSEA MEDICATION ?*UNUSUAL SHORTNESS OF BREATH ?*UNUSUAL BRUISING OR BLEEDING ?*URINARY PROBLEMS (pain or burning when urinating, or frequent urination) ?*BOWEL PROBLEMS (unusual diarrhea, constipation, pain near the anus) ?TENDERNESS IN MOUTH AND THROAT WITH OR WITHOUT PRESENCE OF ULCERS (sore throat, sores in mouth, or a toothache) ?UNUSUAL RASH, SWELLING OR PAIN  ?UNUSUAL VAGINAL DISCHARGE OR ITCHING  ? ?Items with * indicate a potential emergency and should be followed up as soon as possible or go to the Emergency Department if any problems should occur. ? ?Please show the CHEMOTHERAPY ALERT CARD or IMMUNOTHERAPY ALERT CARD at check-in to the  Emergency Department and triage nurse. ? ?Should you have questions after your visit or need to cancel or reschedule your appointment, please contact [facility]COL[facility]  Dept: [phone]  and follow the prompts.  Office hours are 8:00 a.m. to 4:30 p.m. Monday - Friday. Please note that voicemails left after 4:00 p.m. may not be returned until the following business day.  We are closed weekends and major holidays. You have access to a nurse at all times for urgent questions. Please call the main number to the clinic Dept: [phone] and follow the prompts. ? ? ?For any non-urgent questions, you may also contact your provider using MyChart. We now offer e-Visits for anyone 18 and older to request care online for non-urgent symptoms. For details visit [email or web address]. ?  ?Also download the MyChart app! Go to the app store, search "MyChart", open the app, select [facility], and log in with your MyChart username and password. ? ?Due to Covid, a mask is required upon entering the hospital/clinic. If you do not have a mask, one will be given to you upon arrival. For doctor visits, patients may have 1 support person aged 18 or older with them. For treatment visits, patients cannot have anyone with them due to current Covid guidelines and our immunocompromised population.  ? ?

## 2021-04-09 ENCOUNTER — Telehealth: Payer: Self-pay

## 2021-04-09 NOTE — Telephone Encounter (Signed)
She called and left a message. She is scheduled for knee replacement on 11/11 and ortho wants Keytruda completely out of her system prior to surgery.  Next appt for Keytruda and to see you is 10/20. She is asking what do you think? Should she keep appts as scheduled?

## 2021-04-09 NOTE — Telephone Encounter (Signed)
Keep her appt with me but switch it to virtual Cancel her IV and infusion appt

## 2021-04-09 NOTE — Telephone Encounter (Signed)
Called and left a message asking her to call the office back. 10/20 infusion and lab/flush appt canceled. 10/20 appt with Dr. Alvy Bimler changed to virtual.

## 2021-04-10 ENCOUNTER — Encounter: Payer: Self-pay | Admitting: Hematology and Oncology

## 2021-04-11 ENCOUNTER — Telehealth: Payer: Self-pay

## 2021-04-11 NOTE — Telephone Encounter (Signed)
FYI

## 2021-04-11 NOTE — Telephone Encounter (Signed)
Called Patient to explain the reason for cancellation of infusion and lab/flush appointment on 10/20. Patient was concerned about missing the infusion and how it would affect her prognosis with the endometrium carcinoma. Reassured and encouraged her. Explained that infusion appointments have to be rescheduled for various reasons at various times and that she should discuss her concerns with Provider during the virtual appointment on 10/20. Patient agreed. No other concerns voiced at this time.

## 2021-04-11 NOTE — Telephone Encounter (Signed)
Michelle Rosario told me her orthopedic surgeon wants to operate on her and does not want her to receive treatment until then, so I cancelled it

## 2021-04-11 NOTE — Telephone Encounter (Signed)
Received voicemail from Patient stating that she had received the information regarding the virtual appointment on 10/20 with you and the cancelled infusion and lab/flush appointment on 10/20 but that she still needed to discuss this with you.

## 2021-04-26 ENCOUNTER — Inpatient Hospital Stay (HOSPITAL_BASED_OUTPATIENT_CLINIC_OR_DEPARTMENT_OTHER): Payer: Medicare Other | Admitting: Hematology and Oncology

## 2021-04-26 ENCOUNTER — Other Ambulatory Visit: Payer: Medicare Other

## 2021-04-26 ENCOUNTER — Encounter: Payer: Self-pay | Admitting: Hematology and Oncology

## 2021-04-26 ENCOUNTER — Ambulatory Visit: Payer: Medicare Other | Admitting: Hematology and Oncology

## 2021-04-26 ENCOUNTER — Ambulatory Visit: Payer: Medicare Other

## 2021-04-26 DIAGNOSIS — C541 Malignant neoplasm of endometrium: Secondary | ICD-10-CM | POA: Diagnosis not present

## 2021-04-26 DIAGNOSIS — E039 Hypothyroidism, unspecified: Secondary | ICD-10-CM

## 2021-04-26 DIAGNOSIS — M25561 Pain in right knee: Secondary | ICD-10-CM | POA: Diagnosis not present

## 2021-04-26 NOTE — Assessment & Plan Note (Signed)
She had profound osteoarthritis requiring knee surgery There is no contraindication for her to proceed as scheduled Her recent immunotherapy will not impact on wound healing

## 2021-04-26 NOTE — Assessment & Plan Note (Signed)
She will continue her current dose of thyroid supplement I will adjust the dose of her Synthroid as needed in her next visit

## 2021-04-26 NOTE — Assessment & Plan Note (Signed)
I reassured the patient, holding her treatment for few weeks in anticipation for her surgery is not a bad idea and will not jeopardize her longevity I plan to reschedule her next treatment to begin after Thanksgiving to allow recovery from her knee surgery In the meantime, continue supportive care

## 2021-04-26 NOTE — Progress Notes (Signed)
HEMATOLOGY-ONCOLOGY ELECTRONIC VISIT PROGRESS NOTE  Patient Care Team: [name], MD as PCP - General (Family Medicine)  I connected with the patient via telephone conference and verified that I am speaking with the correct person using two identifiers. The patient's location is at home and I am providing care from the [facility] I discussed the limitations, risks, security and privacy concerns of performing an evaluation and management service by e-visits and the availability of in person appointments.  I also discussed with the patient that there may be a patient responsible charge related to this service. The patient expressed understanding and agreed to proceed.   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[place]) I reassured the patient, holding her treatment for few weeks in anticipation for her surgery is not a bad idea and will not jeopardize her longevity I plan to reschedule her next treatment to begin after Thanksgiving to allow recovery from her knee surgery In the meantime, continue supportive care  Right knee pain She had profound osteoarthritis requiring knee surgery There is no contraindication for her to proceed as scheduled Her recent immunotherapy will not impact on wound healing  Acquired hypothyroidism She will continue her current dose of thyroid supplement I will adjust the dose of her Synthroid as needed in her next visit  No orders of the defined types were placed in this encounter.   INTERVAL HISTORY: Please see below for problem oriented charting. The purpose of today's discussion is discussed perioperative management Her treatment today is cancer in anticipation for surgery next month Her knee pain is okay She denies other new symptoms No recent infusion reactions  SUMMARY OF ONCOLOGIC HISTORY: Oncology History Overview Note  MSI stable on June 2017 tissue but high from [employer] [facility] study from November 2017  05/15/16: ER is  moderately positive (70%). PR is strongly positive (80%).   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 Lymph node, sentinel, biopsy, right obturator ONE BENIGN LYMPH NODE (0/1) 3. Lymph nodes, regional resection, left pelvic FOUR BENIGN LYMPH NODES (0/4)   01/02/2016 Surgery   Dr. Denman George performed robotic-assisted laparoscopic total hysterectomy with bilateral salpingoophorectomy, sentinel lymph node biopsy, lymphadenectomy      05/15/2016 Pathology Results   Vagina, biopsy, mid - ADENOCARCINOMA, SEE COMMENT. Microscopic Comment The morphology along with the patient's history are consistent with recurrent endometrioid adenocarcinoma. The carcinoma has a similar appearance to the primary ([id number]).   05/20/2016 Imaging   Ct scan abdomen showed solid 2.5 cm peritoneal mass in the mid to anterior left pelvis, suspicious for peritoneal metastasis. 2. Small expansile low-attenuation filling defect in the left external iliac vein, cannot exclude a small deep venous thrombus. Consider correlation with left lower extremity venous Doppler scan. 3. Small simple fluid density structure in the left pelvic sidewall abutting the left external iliac vessels, favor a small postoperative seroma. 4. No ascites. 5. No lymphadenopathy.  No metastatic disease in the chest. 6. Aortic atherosclerosis.   06/12/2016 PET scan   Intensely hypermetabolic 2.1 cm central pelvic peritoneal mass just to the left of midline, consistent with peritoneal metastatic recurrence. No ascites. 2. No additional hypermetabolic sites of metastatic disease. 3. Diffuse thyroid hypermetabolism without  discrete thyroid nodule, favoring thyroiditis.  Recommend correlation with serum thyroid function tests.   06/24/2016 - 07/22/2016 Chemotherapy   The patient had weekly cisplatin. She has missed several doses due to infection and pancytopenia   06/24/2016 - 09/04/2016 Radiation Therapy   She completed concurrent radiation therapy Radiation treatment dates:   IMRT : 06/24/16 - 08/01/16 HDR : 08/13/16, 08/20/16, 08/27/16, 09/04/16   Site/dose:   Pelvis treated to 55 Gy in 25 fractions (simultaneous integrated boost technique) Vaginal Cuff treated to 24 Gy in 4 fractions   08/15/2016 - 12/03/2016 Chemotherapy   She received 6 cycles of carboplatin/Taxol   09/27/2016 Imaging   Interval decrease in size of previously described solid peritoneal nodule within the left anterior pelvis. Near complete resolution of previously described low-attenuation structure along the left pelvic sidewall. No evidence for metastatic disease in the chest. Aortic atherosclerosis.   12/30/2016 Imaging   Ct abdomen 1. Solitary left pelvic peritoneal implant is mildly decreased in size in the interval. 2. No new or progressive metastatic disease in the abdomen or pelvis. No ascites. 3. Aortic atherosclerosis.   04/02/2017 Imaging   Left lower quadrant peritoneal implant referenced on previous exam measures 1.7 x 1.3 cm, image 69 of series 2. Increased from 0.8 x 0.8 cm previously peer no new peritoneal implants identified.   Musculoskeletal: The degenerative disc disease noted within the lumbar spine.   IMPRESSION: 1. Solitary left pelvic peritoneal implant is increased in size in the interval. 2. No new sites of disease.  No ascites. 3. Aortic atherosclerosis   04/11/2017 PET scan   1. The left side of pelvis peritoneal implant has decreased in size and degree of FDG uptake compatible with response to therapy. No new areas of peritoneal disease identified. 2. Persistent diffuse increased uptake within the thyroid  gland. Correlation with patient's thyroid function may be helpful.   05/26/2017 Imaging   1. Enlarging tumor implant along the left adnexa, currently 2.6 by 2.4 cm and previously 1.9 by 1.3 cm. No new tumor implant or other specific cause for the patient's pelvic symptoms is currently identified. 2.  Aortic Atherosclerosis (ICD10-I70.0). 3. Lumbar spondylosis and degenerative disc disease causing multilevel impingement.   06/04/2017 - 08/28/2017 Chemotherapy   The patient started letrozole and everolimus   08/26/2017 Imaging   Increased size of mass in the left adnexal region.  Several new small liver metastases in right hepatic lobe   09/08/2017 Imaging   LV EF: 60% -   65%   09/10/2017 Procedure   Technically successful right IJ power-injectable port catheter placement. Ready for routine use   12/04/2017 Imaging   1. Interval increase in size and number of multiple lesions within the liver compatible with hepatic metastatic disease. 2. Interval increase in size mass within the left hemipelvis.   12/15/2017 -  Chemotherapy   The patient had pembrolizumab (KEYTRUDA) 200 mg in sodium chloride 0.9 % 50 mL chemo infusion, 200 mg, Intravenous, Once, 1 of 6 cycles Administration: 200 mg (12/15/2017)   for chemotherapy treatment.    12/21/2017 Imaging   1. Moderate left hydronephrosis and hydroureter, similar compared to most recent CT from May 2019; obstruction appears to be secondary to a left pelvic mass/metastatic focus which has increased in size since the prior CT. 2. Numerous hepatic metastatic lesions, suspect that some may be increased in size but difficult to further characterize without intravenous contrast. Increased size of left pelvic soft tissue mass/metastatic lesion. Mass abuts and possibly invades the sigmoid colon.  02/20/2018 Imaging   Interval decrease in hepatic metastases.  Decreased mass or lymphadenopathy in the left external iliac chain. Interval resolution of  left hydroureteronephrosis.  No new or progressive disease within the abdomen or pelvis.   05/16/2018 Imaging   05/16/2018 CT Abdomen IMPRESSION: 1. Slight interval decrease in size of hepatic metastatic disease. 2. Slight interval decrease in size centrally necrotic mass within the left external iliac region.   09/18/2018 Imaging   1. Today's study demonstrates a mixed response to therapy. Specifically, while the previously noted hepatic metastases appear decreased, the soft tissue mass along the left pelvic sidewall appears slightly larger than the prior study. No new metastatic disease is noted elsewhere in the abdomen or pelvis. 2. Aortic atherosclerosis. 3. Mild cardiomegaly. 4. Additional incidental findings, as above     03/12/2019 Imaging   1. Overall improvement with reduced size of the hepatic metastatic lesions, and reduced size of the irregular soft tissue mass in the left pelvis. 2. Other imaging findings of potential clinical significance: Mild distal esophageal wall thickening, cannot exclude esophagitis. Aortic Atherosclerosis (ICD10-I70.0). Lumbar impingement at L5-S1.     09/09/2019 Imaging   1. Stable size and appearance of the somewhat irregular left adnexal mass, currently 2.8 by 1.9 cm. 2. The scattered hepatic metastatic lesions are stable from 03/12/2019, and represent effectively treated metastatic lesions. 3. Other imaging findings of potential clinical significance: Airway thickening is present, suggesting bronchitis or reactive airways disease. Lumbar spondylosis and degenerative disc disease causing impingement at L4-5 and L5-S1. Small umbilical hernia contains adipose tissue.   Aortic Atherosclerosis (ICD10-I70.0).   03/07/2020 Imaging   1. Stable appearance of LEFT pelvic mass without new signs of disease. Mass remains closely approximated to the colon, in the vicinity of the distal LEFT ureter and the iliac vessels. 2. Stable signs of hepatic metastatic  disease with calcified lesion at the dome of the liver and subtle areas of low attenuation elsewhere which show no change.   09/12/2020 Imaging   1. Stable appearance of the partially calcified LEFT pelvic sidewall soft tissue mass with unchanged involvement of the sigmoid colon, iliac vessels and close proximity to the left ureter. No new signs of disease. 2. Stable signs of the hepatic metastatic disease with calcified lesion at the hepatic dome and subcentimeter area of low attenuation in the right hepatic lobe. 3. No evidence of new or enlarging abdominopelvic metastatic disease. 4. Aortic atherosclerosis.   03/15/2021 Imaging   1. Stable exam. No new or progressive interval findings. 2. Spiculated calcified soft tissue lesion along the left pelvic sidewall is stable in the interval. This lesion tethers the sigmoid colon and is in close proximity to the left ureter and left external iliac vasculature. 3. Stable appearance of the calcified lesion at the dome of the liver and hypoattenuating lesion in the right liver. 4. Small umbilical hernia contains only fat.     REVIEW OF SYSTEMS:   Constitutional: Denies fevers, chills or abnormal weight loss Eyes: Denies blurriness of vision Ears, nose, mouth, throat, and face: Denies mucositis or sore throat Respiratory: Denies cough, dyspnea or wheezes Cardiovascular: Denies palpitation, chest discomfort Gastrointestinal:  Denies nausea, heartburn or change in bowel habits Skin: Denies abnormal skin rashes Lymphatics: Denies new lymphadenopathy or easy bruising Neurological:Denies numbness, tingling or new weaknesses Behavioral/Psych: Mood is stable, no new changes  Extremities: No lower extremity edema All other systems were reviewed with the patient and are negative.  I have reviewed the past medical history, past   surgical history, social history and family history with the patient and they are unchanged from previous note.  ALLERGIES:  is  allergic to latex and lisinopril.  MEDICATIONS:  Current Outpatient Medications  Medication Sig Dispense Refill   acetaminophen (TYLENOL) 325 MG tablet Take 650 mg by mouth every 6 (six) hours as needed for mild pain or moderate pain.      amoxicillin (AMOXIL) 500 MG capsule Take 2,000 mg by mouth See admin instructions. Prior to dental appointment, takes 4 tables prior to appt     Biotin w/ Vitamins C & E (HAIR/SKIN/NAILS PO) Take 1 tablet by mouth daily.     Calcium Carb-Cholecalciferol (CALCIUM 600 + D PO) Take 2 tablets by mouth daily.     diclofenac (VOLTAREN) 75 MG EC tablet Take 75 mg by mouth 2 (two) times daily as needed.     fluticasone (FLONASE) 50 MCG/ACT nasal spray Place 1 spray into both nostrils 2 (two) times daily as needed for allergies.   1   gabapentin (NEURONTIN) 600 MG tablet TAKE 1 TABLET BY MOUTH TWICE A DAY FOR RESTLESS LEGS AND NEUROPATHY. 60 tablet 11   levothyroxine (SYNTHROID) 200 MCG tablet Take 1 tablet (200 mcg total) by mouth daily. 90 tablet 3   levothyroxine (SYNTHROID) 25 MCG tablet Take 1 tablet (25 mcg total) by mouth daily before breakfast. Take this with the 200 mcg tablet only on Mondays, Wednesdays and Fridays 90 tablet 2   lidocaine-prilocaine (EMLA) cream APPLY TO AFFECTED AREA ONCE AS DIRECTED 30 g 3   loratadine (CLARITIN) 10 MG tablet Take 10 mg by mouth daily.     LORazepam (ATIVAN) 1 MG tablet Take 1 tablet (1 mg total) by mouth every 8 (eight) hours as needed for anxiety or sleep. 90 tablet 0   losartan-hydrochlorothiazide (HYZAAR) 100-12.5 MG tablet Take 0.5 tablets by mouth daily. 60 tablet 9   magnesium oxide (MAG-OX) 400 MG tablet Take 1 tablet by mouth 2 (two) times daily.     meclizine (ANTIVERT) 25 MG tablet Take 25 mg by mouth 3 (three) times daily. Pt has vertigo  Take for 7 days     Melatonin 10 MG CAPS Take 10 mg by mouth at bedtime.     niacinamide 500 MG tablet Take 500 mg by mouth 3 (three) times daily with meals.     ondansetron  (ZOFRAN) 8 MG tablet Take 1 tablet (8 mg total) by mouth every 8 (eight) hours as needed for nausea. 60 tablet 1   Oxycodone HCl 10 MG TABS Take 1 tablet (10 mg total) by mouth 2 (two) times daily as needed. 30 tablet 0   pramipexole (MIRAPEX) 0.5 MG tablet Take 1 mg by mouth at bedtime.     No current facility-administered medications for this visit.    PHYSICAL EXAMINATION: ECOG PERFORMANCE STATUS: 1 - Symptomatic but completely ambulatory  LABORATORY DATA:  I have reviewed the data as listed CMP Latest Ref Rng & Units 04/05/2021 03/14/2021 02/22/2021  Glucose 70 - 99 mg/dL 88 90 83  BUN 8 - 23 mg/dL 21 20 27(H)  Creatinine 0.44 - 1.00 mg/dL 0.82 0.79 0.82  Sodium 135 - 145 mmol/L 138 139 137  Potassium 3.5 - 5.1 mmol/L 4.1 3.8 4.0  Chloride 98 - 111 mmol/L 104 103 104  CO2 22 - 32 mmol/L 23 25 22  Calcium 8.9 - 10.3 mg/dL 9.7 9.8 10.1  Total Protein 6.5 - 8.1 g/dL 7.0 7.2 7.4  Total Bilirubin 0.3 - 1.2   mg/dL 0.6 0.6 0.6  Alkaline Phos 38 - 126 U/L 119 132(H) 142(H)  AST 15 - 41 U/L _0 ALT 0 - 44 U/L _1 Lab Results  Component Value Date   WBC 7.5 04/05/2021   HGB 11.7 (L) 04/05/2021   HCT 35.5 (L) 04/05/2021   MCV 82.0 04/05/2021   PLT 206 04/05/2021   NEUTROABS 4.5 04/05/2021    I discussed the assessment and treatment plan with the patient. The patient was provided an opportunity to ask questions and all were answered. The patient agreed with the plan and demonstrated an understanding of the instructions. The patient was advised to call back or seek an in-person evaluation if the symptoms worsen or if the condition fails to improve as anticipated.    I spent 20 minutes for the appointment reviewing test results, discuss management and coordination of care.  Heath Lark, MD 04/26/2021 2:23 PM

## 2021-05-04 ENCOUNTER — Telehealth: Payer: Self-pay

## 2021-05-04 NOTE — Telephone Encounter (Signed)
I have not recommended any patients to receive the new booster vaccine

## 2021-05-04 NOTE — Telephone Encounter (Signed)
She called and left a message. She is scheduled for knee replacement surgery on 11/11. The ortho office keeps asking her about getting a covid booster.  She is asking what you recommend before she gets the current booster?

## 2021-05-04 NOTE — Telephone Encounter (Signed)
Called and given below message. She verbalized understanding. 

## 2021-06-01 ENCOUNTER — Telehealth: Payer: Self-pay

## 2021-06-01 NOTE — Telephone Encounter (Signed)
Returned patient's call and provided date and time of upcoming appointments scheduled for Tuesday, November 29th. Patient verbalized an understanding of the information and appreciated the call.

## 2021-06-04 ENCOUNTER — Telehealth: Payer: Self-pay | Admitting: *Deleted

## 2021-06-04 NOTE — Telephone Encounter (Signed)
Contacted patient with Dr. Alvy Bimler response as noted in previous message. Advised her that appts for tomorrow were cancelled and to expect call from Leslie in next 2-3 days to r/s in 2 weeks. Also advised that if she has not been contacted by schedulers by Thursday 12//1/22 to contact office. Schedule message sent. Patient verbalized understanding of all information

## 2021-06-04 NOTE — Telephone Encounter (Signed)
I recommend delaying for 2 weeks, earliest chemo would be 12/9 or the following week Please reschedule her appt

## 2021-06-04 NOTE — Telephone Encounter (Signed)
Patient called. She had positive Flu test on 06/01/21. She has appts on 06/05/21 for labs, see Dr. Alvy Bimler and receive Beryle Flock. She wants to know if she should come to these appts? Message routed to Dr. Alvy Bimler

## 2021-06-05 ENCOUNTER — Other Ambulatory Visit: Payer: Medicare Other

## 2021-06-05 ENCOUNTER — Ambulatory Visit: Payer: Medicare Other

## 2021-06-05 ENCOUNTER — Ambulatory Visit: Payer: Medicare Other | Admitting: Hematology and Oncology

## 2021-06-06 ENCOUNTER — Telehealth: Payer: Self-pay | Admitting: Hematology and Oncology

## 2021-06-06 NOTE — Telephone Encounter (Signed)
Scheduled per sch msg. Called and spoke with patient. Confirmed appt  

## 2021-06-18 ENCOUNTER — Inpatient Hospital Stay: Payer: Medicare Other

## 2021-06-18 ENCOUNTER — Other Ambulatory Visit: Payer: Self-pay

## 2021-06-18 ENCOUNTER — Encounter: Payer: Self-pay | Admitting: Hematology and Oncology

## 2021-06-18 ENCOUNTER — Inpatient Hospital Stay: Payer: Medicare Other | Attending: Hematology and Oncology | Admitting: Hematology and Oncology

## 2021-06-18 DIAGNOSIS — E039 Hypothyroidism, unspecified: Secondary | ICD-10-CM | POA: Diagnosis not present

## 2021-06-18 DIAGNOSIS — C787 Secondary malignant neoplasm of liver and intrahepatic bile duct: Secondary | ICD-10-CM

## 2021-06-18 DIAGNOSIS — C541 Malignant neoplasm of endometrium: Secondary | ICD-10-CM | POA: Diagnosis present

## 2021-06-18 DIAGNOSIS — M25561 Pain in right knee: Secondary | ICD-10-CM | POA: Insufficient documentation

## 2021-06-18 DIAGNOSIS — Z79899 Other long term (current) drug therapy: Secondary | ICD-10-CM | POA: Diagnosis not present

## 2021-06-18 DIAGNOSIS — Z5112 Encounter for antineoplastic immunotherapy: Secondary | ICD-10-CM | POA: Insufficient documentation

## 2021-06-18 DIAGNOSIS — D638 Anemia in other chronic diseases classified elsewhere: Secondary | ICD-10-CM | POA: Insufficient documentation

## 2021-06-18 LAB — COMPREHENSIVE METABOLIC PANEL
ALT: 9 U/L (ref 0–44)
AST: 10 U/L — ABNORMAL LOW (ref 15–41)
Albumin: 3.2 g/dL — ABNORMAL LOW (ref 3.5–5.0)
Alkaline Phosphatase: 111 U/L (ref 38–126)
Anion gap: 10 (ref 5–15)
BUN: 26 mg/dL — ABNORMAL HIGH (ref 8–23)
CO2: 24 mmol/L (ref 22–32)
Calcium: 8.6 mg/dL — ABNORMAL LOW (ref 8.9–10.3)
Chloride: 107 mmol/L (ref 98–111)
Creatinine, Ser: 0.72 mg/dL (ref 0.44–1.00)
GFR, Estimated: >60 mL/min (ref >60)
Glucose, Bld: 127 mg/dL — ABNORMAL HIGH (ref 70–99)
Potassium: 3.5 mmol/L (ref 3.5–5.1)
Sodium: 141 mmol/L (ref 135–145)
Total Bilirubin: 0.7 mg/dL (ref 0.3–1.2)
Total Protein: 6.3 g/dL — ABNORMAL LOW (ref 6.5–8.1)

## 2021-06-18 LAB — CBC WITH DIFFERENTIAL/PLATELET
Abs Immature Granulocytes: 0.05 10*3/uL (ref 0.00–0.07)
Basophils Absolute: 0.1 10*3/uL (ref 0.0–0.1)
Basophils Relative: 1 %
Eosinophils Absolute: 0.3 10*3/uL (ref 0.0–0.5)
Eosinophils Relative: 3 %
HCT: 33.2 % — ABNORMAL LOW (ref 36.0–46.0)
Hemoglobin: 10.7 g/dL — ABNORMAL LOW (ref 12.0–15.0)
Immature Granulocytes: 1 %
Lymphocytes Relative: 16 %
Lymphs Abs: 1.4 10*3/uL (ref 0.7–4.0)
MCH: 27 pg (ref 26.0–34.0)
MCHC: 32.2 g/dL (ref 30.0–36.0)
MCV: 83.6 fL (ref 80.0–100.0)
Monocytes Absolute: 0.6 10*3/uL (ref 0.1–1.0)
Monocytes Relative: 7 %
Neutro Abs: 6.4 10*3/uL (ref 1.7–7.7)
Neutrophils Relative %: 72 %
Platelets: 195 10*3/uL (ref 150–400)
RBC: 3.97 MIL/uL (ref 3.87–5.11)
RDW: 13.9 % (ref 11.5–15.5)
WBC: 8.7 10*3/uL (ref 4.0–10.5)
nRBC: 0 % (ref 0.0–0.2)

## 2021-06-18 LAB — TSH: TSH: 0.99 u[IU]/mL (ref 0.308–3.960)

## 2021-06-18 LAB — T4, FREE: Free T4: 1.96 ng/dL — ABNORMAL HIGH (ref 0.61–1.12)

## 2021-06-18 MED ORDER — LEVOTHYROXINE SODIUM 25 MCG PO TABS: 25.0000 ug | ORAL_TABLET | Freq: Every day | ORAL | 2 refills | Status: DC

## 2021-06-18 MED ORDER — SODIUM CHLORIDE 0.9 % IV SOLN
200.0000 mg | Freq: Once | INTRAVENOUS | Status: AC
  Administered 2021-06-18: 200 mg via INTRAVENOUS
  Filled 2021-06-18: qty 8

## 2021-06-18 MED ORDER — LEVOTHYROXINE SODIUM 200 MCG PO TABS: 200.0000 ug | ORAL_TABLET | Freq: Every day | ORAL | 3 refills | Status: DC

## 2021-06-18 MED ORDER — HEPARIN SOD (PORK) LOCK FLUSH 100 UNIT/ML IV SOLN
500.0000 [IU] | Freq: Once | INTRAVENOUS | Status: AC | PRN
  Administered 2021-06-18: 500 [IU]

## 2021-06-18 MED ORDER — SODIUM CHLORIDE 0.9% FLUSH
10.0000 mL | INTRAVENOUS | Status: DC | PRN
  Administered 2021-06-18: 10 mL

## 2021-06-18 MED ORDER — SODIUM CHLORIDE 0.9 % IV SOLN: Freq: Once | INTRAVENOUS | Status: AC

## 2021-06-18 NOTE — Assessment & Plan Note (Signed)
She has mild intermittent anemia chronic illness Observed for now 

## 2021-06-18 NOTE — Patient Instructions (Signed)

## 2021-06-18 NOTE — Assessment & Plan Note (Signed)
She will continue her current dose of thyroid supplement I will adjust the dose of her Synthroid as needed in her next visit

## 2021-06-18 NOTE — Progress Notes (Signed)
[place] OFFICE PROGRESS NOTE  Patient Care Team: [name], MD as PCP - General (Family Medicine)  ASSESSMENT & PLAN:  Recurrent carcinoma of endometrium [facility]) She is recovering well from recent knee surgery We will resume treatment Per previous discussion, with spacing out her imaging study She will not need imaging study until early spring of next year  Acquired hypothyroidism She will continue her current dose of thyroid supplement I will adjust the dose of her Synthroid as needed in her next visit  Right knee pain This is resolved since recent knee surgery She does have pain medicine to take as needed  Anemia, chronic disease She has mild intermittent anemia chronic illness Observed for now  No orders of the defined types were placed in this encounter.   All questions were answered. The patient knows to call the clinic with any problems, questions or concerns. The total time spent in the appointment was 20 minutes encounter with patients including review of chart and various tests results, discussions about plan of care and coordination of care plan   Heath Lark, MD 06/18/2021 2:14 PM  INTERVAL HISTORY: Please see below for problem oriented charting. she returns for treatment follow-up on Keytruda for recurrent uterine cancer She missed several treatment due to recent surgery and influenza diagnosis She is healing well from both She is able to walk and not taking any pain medicine Unfortunately, she has gained some weight since last time I saw her  REVIEW OF SYSTEMS:   Constitutional: Denies fevers, chills or abnormal weight loss Eyes: Denies blurriness of vision Ears, nose, mouth, throat, and face: Denies mucositis or sore throat Respiratory: Denies cough, dyspnea or wheezes Cardiovascular: Denies palpitation, chest discomfort or lower extremity swelling Gastrointestinal:  Denies nausea, heartburn or change in bowel habits Skin: Denies  abnormal skin rashes Lymphatics: Denies new lymphadenopathy or easy bruising Neurological:Denies numbness, tingling or new weaknesses Behavioral/Psych: Mood is stable, no new changes  All other systems were reviewed with the patient and are negative.  I have reviewed the past medical history, past surgical history, social history and family history with the patient and they are unchanged from previous note.  ALLERGIES:  is allergic to latex and lisinopril.  MEDICATIONS:  Current Outpatient Medications  Medication Sig Dispense Refill   acetaminophen (TYLENOL) 325 MG tablet Take 650 mg by mouth every 6 (six) hours as needed for mild pain or moderate pain.      amoxicillin (AMOXIL) 500 MG capsule Take 2,000 mg by mouth See admin instructions. Prior to dental appointment, takes 4 tables prior to appt     Biotin w/ Vitamins C & E (HAIR/SKIN/NAILS PO) Take 1 tablet by mouth daily.     Calcium Carb-Cholecalciferol (CALCIUM 600 + D PO) Take 2 tablets by mouth daily.     diclofenac (VOLTAREN) 75 MG EC tablet Take 75 mg by mouth 2 (two) times daily as needed.     fluticasone (FLONASE) 50 MCG/ACT nasal spray Place 1 spray into both nostrils 2 (two) times daily as needed for allergies.   1   gabapentin (NEURONTIN) 600 MG tablet TAKE 1 TABLET BY MOUTH TWICE A DAY FOR RESTLESS LEGS AND NEUROPATHY. 60 tablet 11   levothyroxine (SYNTHROID) 200 MCG tablet Take 1 tablet (200 mcg total) by mouth daily. 90 tablet 3   levothyroxine (SYNTHROID) 25 MCG tablet Take 1 tablet (25 mcg total) by mouth daily before breakfast. Take this with the 200 mcg tablet only on Mondays, Wednesdays  and Fridays 90 tablet 2   lidocaine-prilocaine (EMLA) cream APPLY TO AFFECTED AREA ONCE AS DIRECTED 30 g 3   loratadine (CLARITIN) 10 MG tablet Take 10 mg by mouth daily.     LORazepam (ATIVAN) 1 MG tablet Take 1 tablet (1 mg total) by mouth every 8 (eight) hours as needed for anxiety or sleep. 90 tablet 0   losartan-hydrochlorothiazide  (HYZAAR) 100-12.5 MG tablet Take 0.5 tablets by mouth daily. 60 tablet 9   magnesium oxide (MAG-OX) 400 MG tablet Take 1 tablet by mouth 2 (two) times daily.     meclizine (ANTIVERT) 25 MG tablet Take 25 mg by mouth 3 (three) times daily. Pt has vertigo  Take for 7 days     Melatonin 10 MG CAPS Take 10 mg by mouth at bedtime.     niacinamide 500 MG tablet Take 500 mg by mouth 3 (three) times daily with meals.     ondansetron (ZOFRAN) 8 MG tablet Take 1 tablet (8 mg total) by mouth every 8 (eight) hours as needed for nausea. 60 tablet 1   pramipexole (MIRAPEX) 0.5 MG tablet Take 1 mg by mouth at bedtime.     No current facility-administered medications for this visit.   Facility-Administered Medications Ordered in Other Visits  Medication Dose Route Frequency Provider Last Rate Last Admin   sodium chloride flush (NS) 0.9 % injection 10 mL  10 mL Intracatheter PRN Heath Lark, MD   10 mL at 06/18/21 1359    SUMMARY OF ONCOLOGIC HISTORY: Oncology History Overview Note  MSI stable on June 2017 tissue but high from [facility] study from November 2017  05/15/16: ER is moderately positive (70%). PR is strongly positive (80%).   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 Lymph node, sentinel, biopsy, right obturator ONE BENIGN LYMPH NODE (0/1) 3. Lymph nodes, regional resection, left pelvic FOUR BENIGN LYMPH NODES (0/4)   01/02/2016 Surgery   Dr. Denman George performed robotic-assisted laparoscopic total hysterectomy with bilateral salpingoophorectomy, sentinel lymph node biopsy, lymphadenectomy      05/15/2016 Pathology Results   Vagina, biopsy, mid -  ADENOCARCINOMA, SEE COMMENT. Microscopic Comment The morphology along with the patient's history are consistent with recurrent endometrioid adenocarcinoma. The carcinoma has a similar appearance to the primary ([id number]).   05/20/2016 Imaging   Ct scan abdomen showed solid 2.5 cm peritoneal mass in the mid to anterior left pelvis, suspicious for peritoneal metastasis. 2. Small expansile low-attenuation filling defect in the left external iliac vein, cannot exclude a small deep venous thrombus. Consider correlation with left lower extremity venous Doppler scan. 3. Small simple fluid density structure in the left pelvic sidewall abutting the left external iliac vessels, favor a small postoperative seroma. 4. No ascites. 5. No lymphadenopathy.  No metastatic disease in the chest. 6. Aortic atherosclerosis.   06/12/2016 PET scan   Intensely hypermetabolic 2.1 cm central pelvic peritoneal mass just to the left of midline, consistent with peritoneal metastatic recurrence. No ascites. 2. No additional hypermetabolic sites of metastatic disease. 3. Diffuse thyroid hypermetabolism without discrete thyroid nodule, favoring thyroiditis. Recommend correlation with serum thyroid function tests.   06/24/2016 - 07/22/2016 Chemotherapy   The patient had weekly cisplatin. She has missed several doses due to infection  and pancytopenia   06/24/2016 - 09/04/2016 Radiation Therapy   She completed concurrent radiation therapy Radiation treatment dates:   IMRT : 06/24/16 - 08/01/16 HDR : 08/13/16, 08/20/16, 08/27/16, 09/04/16   Site/dose:   Pelvis treated to 55 Gy in 25 fractions (simultaneous integrated boost technique) Vaginal Cuff treated to 24 Gy in 4 fractions   08/15/2016 - 12/03/2016 Chemotherapy   She received 6 cycles of carboplatin/Taxol   09/27/2016 Imaging   Interval decrease in size of previously described solid peritoneal nodule within the left anterior pelvis. Near complete resolution of previously described  low-attenuation structure along the left pelvic sidewall. No evidence for metastatic disease in the chest. Aortic atherosclerosis.   12/30/2016 Imaging   Ct abdomen 1. Solitary left pelvic peritoneal implant is mildly decreased in size in the interval. 2. No new or progressive metastatic disease in the abdomen or pelvis. No ascites. 3. Aortic atherosclerosis.   04/02/2017 Imaging   Left lower quadrant peritoneal implant referenced on previous exam measures 1.7 x 1.3 cm, image 69 of series 2. Increased from 0.8 x 0.8 cm previously peer no new peritoneal implants identified.   Musculoskeletal: The degenerative disc disease noted within the lumbar spine.   IMPRESSION: 1. Solitary left pelvic peritoneal implant is increased in size in the interval. 2. No new sites of disease.  No ascites. 3. Aortic atherosclerosis   04/11/2017 PET scan   1. The left side of pelvis peritoneal implant has decreased in size and degree of FDG uptake compatible with response to therapy. No new areas of peritoneal disease identified. 2. Persistent diffuse increased uptake within the thyroid gland. Correlation with patient's thyroid function may be helpful.   05/26/2017 Imaging   1. Enlarging tumor implant along the left adnexa, currently 2.6 by 2.4 cm and previously 1.9 by 1.3 cm. No new tumor implant or other specific cause for the patient's pelvic symptoms is currently identified. 2.  Aortic Atherosclerosis (ICD10-I70.0). 3. Lumbar spondylosis and degenerative disc disease causing multilevel impingement.   06/04/2017 - 08/28/2017 Chemotherapy   The patient started letrozole and everolimus   08/26/2017 Imaging   Increased size of mass in the left adnexal region.  Several new small liver metastases in right hepatic lobe   09/08/2017 Imaging   LV EF: 60% -   65%   09/10/2017 Procedure   Technically successful right IJ power-injectable port catheter placement. Ready for routine use   12/04/2017 Imaging   1.  Interval increase in size and number of multiple lesions within the liver compatible with hepatic metastatic disease. 2. Interval increase in size mass within the left hemipelvis.   12/15/2017 -  Chemotherapy   The patient had pembrolizumab (KEYTRUDA) 200 mg in sodium chloride 0.9 % 50 mL chemo infusion, 200 mg, Intravenous, Once, 1 of 6 cycles Administration: 200 mg (12/15/2017)   for chemotherapy treatment.    12/21/2017 Imaging   1. Moderate left hydronephrosis and hydroureter, similar compared to most recent CT from May 2019; obstruction appears to be secondary to a left pelvic mass/metastatic focus which has increased in size since the prior CT. 2. Numerous hepatic metastatic lesions, suspect that some may be increased in size but difficult to further characterize without intravenous contrast. Increased size of left pelvic soft tissue mass/metastatic lesion. Mass abuts and possibly invades the sigmoid colon.     02/20/2018 Imaging   Interval decrease in hepatic metastases.  Decreased mass or lymphadenopathy in the left external iliac chain. Interval resolution of left hydroureteronephrosis.  No  new or progressive disease within the abdomen or pelvis.   05/16/2018 Imaging   05/16/2018 CT Abdomen IMPRESSION: 1. Slight interval decrease in size of hepatic metastatic disease. 2. Slight interval decrease in size centrally necrotic mass within the left external iliac region.   09/18/2018 Imaging   1. Today's study demonstrates a mixed response to therapy. Specifically, while the previously noted hepatic metastases appear decreased, the soft tissue mass along the left pelvic sidewall appears slightly larger than the prior study. No new metastatic disease is noted elsewhere in the abdomen or pelvis. 2. Aortic atherosclerosis. 3. Mild cardiomegaly. 4. Additional incidental findings, as above     03/12/2019 Imaging   1. Overall improvement with reduced size of the hepatic metastatic lesions,  and reduced size of the irregular soft tissue mass in the left pelvis. 2. Other imaging findings of potential clinical significance: Mild distal esophageal wall thickening, cannot exclude esophagitis. Aortic Atherosclerosis (ICD10-I70.0). Lumbar impingement at L5-S1.     09/09/2019 Imaging   1. Stable size and appearance of the somewhat irregular left adnexal mass, currently 2.8 by 1.9 cm. 2. The scattered hepatic metastatic lesions are stable from 03/12/2019, and represent effectively treated metastatic lesions. 3. Other imaging findings of potential clinical significance: Airway thickening is present, suggesting bronchitis or reactive airways disease. Lumbar spondylosis and degenerative disc disease causing impingement at L4-5 and L5-S1. Small umbilical hernia contains adipose tissue.   Aortic Atherosclerosis (ICD10-I70.0).   03/07/2020 Imaging   1. Stable appearance of LEFT pelvic mass without new signs of disease. Mass remains closely approximated to the colon, in the vicinity of the distal LEFT ureter and the iliac vessels. 2. Stable signs of hepatic metastatic disease with calcified lesion at the dome of the liver and subtle areas of low attenuation elsewhere which show no change.   09/12/2020 Imaging   1. Stable appearance of the partially calcified LEFT pelvic sidewall soft tissue mass with unchanged involvement of the sigmoid colon, iliac vessels and close proximity to the left ureter. No new signs of disease. 2. Stable signs of the hepatic metastatic disease with calcified lesion at the hepatic dome and subcentimeter area of low attenuation in the right hepatic lobe. 3. No evidence of new or enlarging abdominopelvic metastatic disease. 4. Aortic atherosclerosis.   03/15/2021 Imaging   1. Stable exam. No new or progressive interval findings. 2. Spiculated calcified soft tissue lesion along the left pelvic sidewall is stable in the interval. This lesion tethers the sigmoid colon and is in  close proximity to the left ureter and left external iliac vasculature. 3. Stable appearance of the calcified lesion at the dome of the liver and hypoattenuating lesion in the right liver. 4. Small umbilical hernia contains only fat.     PHYSICAL EXAMINATION: ECOG PERFORMANCE STATUS: 1 - Symptomatic but completely ambulatory  Vitals:   06/18/21 1149  BP: (!) 144/76  Pulse: 89  Resp: 18  Temp: 98.6 F (37 C)  SpO2: 97%   Filed Weights   06/18/21 1149  Weight: 214 lb (97.1 kg)    GENERAL:alert, no distress and comfortable SKIN: skin color, texture, turgor are normal, no rashes or significant lesions EYES: normal, Conjunctiva are pink and non-injected, sclera clear OROPHARYNX:no exudate, no erythema and lips, buccal mucosa, and tongue normal  NECK: supple, thyroid normal size, non-tender, without nodularity LYMPH:  no palpable lymphadenopathy in the cervical, axillary or inguinal LUNGS: clear to auscultation and percussion with normal breathing effort HEART: regular rate & rhythm and no murmurs  and no lower extremity edema ABDOMEN:abdomen soft, non-tender and normal bowel sounds Musculoskeletal:no cyanosis of digits and no clubbing  NEURO: alert & oriented x 3 with fluent speech, no focal motor/sensory deficits  LABORATORY DATA:  I have reviewed the data as listed    Component Value Date/Time   NA 141 06/18/2021 1118   NA 141 07/09/2017 1312   K 3.5 06/18/2021 1118   K 3.9 07/09/2017 1312   CL 107 06/18/2021 1118   CO2 24 06/18/2021 1118   CO2 27 07/09/2017 1312   GLUCOSE 127 (H) 06/18/2021 1118   GLUCOSE 109 07/09/2017 1312   BUN 26 (H) 06/18/2021 1118   BUN 16.4 07/09/2017 1312   CREATININE 0.72 06/18/2021 1118   CREATININE 0.66 01/25/2021 1149   CREATININE 0.8 07/09/2017 1312   CALCIUM 8.6 (L) 06/18/2021 1118   CALCIUM 9.2 07/09/2017 1312   PROT 6.3 (L) 06/18/2021 1118   PROT 7.1 07/09/2017 1312   ALBUMIN 3.2 (L) 06/18/2021 1118   ALBUMIN 3.1 (L) 07/09/2017  1312   AST 10 (L) 06/18/2021 1118   AST 21 01/25/2021 1149   AST 35 (H) 07/09/2017 1312   ALT 9 06/18/2021 1118   ALT 20 01/25/2021 1149   ALT 43 07/09/2017 1312   ALKPHOS 111 06/18/2021 1118   ALKPHOS 182 (H) 07/09/2017 1312   BILITOT 0.7 06/18/2021 1118   BILITOT 0.6 01/25/2021 1149   BILITOT 0.34 07/09/2017 1312   GFRNONAA >60 06/18/2021 1118   GFRNONAA >60 01/25/2021 1149   GFRAA >60 03/29/2020 1105   GFRAA >60 01/04/2019 1351    No results found for: SPEP, UPEP  Lab Results  Component Value Date   WBC 8.7 06/18/2021   NEUTROABS 6.4 06/18/2021   HGB 10.7 (L) 06/18/2021   HCT 33.2 (L) 06/18/2021   MCV 83.6 06/18/2021   PLT 195 06/18/2021      Chemistry      Component Value Date/Time   NA 141 06/18/2021 1118   NA 141 07/09/2017 1312   K 3.5 06/18/2021 1118   K 3.9 07/09/2017 1312   CL 107 06/18/2021 1118   CO2 24 06/18/2021 1118   CO2 27 07/09/2017 1312   BUN 26 (H) 06/18/2021 1118   BUN 16.4 07/09/2017 1312   CREATININE 0.72 06/18/2021 1118   CREATININE 0.66 01/25/2021 1149   CREATININE 0.8 07/09/2017 1312      Component Value Date/Time   CALCIUM 8.6 (L) 06/18/2021 1118   CALCIUM 9.2 07/09/2017 1312   ALKPHOS 111 06/18/2021 1118   ALKPHOS 182 (H) 07/09/2017 1312   AST 10 (L) 06/18/2021 1118   AST 21 01/25/2021 1149   AST 35 (H) 07/09/2017 1312   ALT 9 06/18/2021 1118   ALT 20 01/25/2021 1149   ALT 43 07/09/2017 1312   BILITOT 0.7 06/18/2021 1118   BILITOT 0.6 01/25/2021 1149   BILITOT 0.34 07/09/2017 1312

## 2021-06-18 NOTE — Assessment & Plan Note (Signed)
This is resolved since recent knee surgery She does have pain medicine to take as needed

## 2021-06-18 NOTE — Patient Instructions (Signed)
[facility] MEDICAL [facility]COLOG[facility]  Discharge Instructions: ?Thank you for choosing [facility] to provide your oncology and hematology care.  ? ?If you have a lab appointment with the Cancer Center, please go directly to the Cancer Center and check in at the registration area. ?  ?Wear comfortable clothing and clothing appropriate for easy access to any Portacath or PICC line.  ? ?We strive to give you quality time with your provider. You may need to reschedule your appointment if you arrive late (15 or more minutes).  Arriving late affects you and other patients whose appointments are after yours.  Also, if you miss three or more appointments without notifying the office, you may be dismissed from the clinic at the provider?s discretion.    ?  ?For prescription refill requests, have your pharmacy contact our office and allow 72 hours for refills to be completed.   ? ?Today you received the following chemotherapy and/or immunotherapy agents: Keytruda ?  ?To help prevent nausea and vomiting after your treatment, we encourage you to take your nausea medication as directed. ? ?BELOW ARE SYMPTOMS THAT SHOULD BE REPORTED IMMEDIATELY: ?*FEVER GREATER THAN 100.4 F (38 ?C) OR HIGHER ?*CHILLS OR SWEATING ?*NAUSEA AND VOMITING THAT IS NOT CONTROLLED WITH YOUR NAUSEA MEDICATION ?*UNUSUAL SHORTNESS OF BREATH ?*UNUSUAL BRUISING OR BLEEDING ?*URINARY PROBLEMS (pain or burning when urinating, or frequent urination) ?*BOWEL PROBLEMS (unusual diarrhea, constipation, pain near the anus) ?TENDERNESS IN MOUTH AND THROAT WITH OR WITHOUT PRESENCE OF ULCERS (sore throat, sores in mouth, or a toothache) ?UNUSUAL RASH, SWELLING OR PAIN  ?UNUSUAL VAGINAL DISCHARGE OR ITCHING  ? ?Items with * indicate a potential emergency and should be followed up as soon as possible or go to the Emergency Department if any problems should occur. ? ?Please show the CHEMOTHERAPY ALERT CARD or IMMUNOTHERAPY ALERT CARD at check-in to the  Emergency Department and triage nurse. ? ?Should you have questions after your visit or need to cancel or reschedule your appointment, please contact [facility]  Dept: [phone]  and follow the prompts.  Office hours are 8:00 a.m. to 4:30 p.m. Monday - Friday. Please note that voicemails left after 4:00 p.m. may not be returned until the following business day.  We are closed weekends and major holidays. You have access to a nurse at all times for urgent questions. Please call the main number to the clinic Dept: [phone] and follow the prompts. ? ? ?For any non-urgent questions, you may also contact your provider using MyChart. We now offer e-Visits for anyone 18 and older to request care online for non-urgent symptoms. For details visit [email or web address]. ?  ?Also download the MyChart app! Go to the app store, search "MyChart", open the app, select , and log in with your MyChart username and password. ? ?Due to Covid, a mask is required upon entering the hospital/clinic. If you do not have a mask, one will be given to you upon arrival. For doctor visits, patients may have 1 support person aged 18 or older with them. For treatment visits, patients cannot have anyone with them due to current Covid guidelines and our immunocompromised population.  ? ?

## 2021-06-18 NOTE — Assessment & Plan Note (Signed)
She is recovering well from recent knee surgery We will resume treatment Per previous discussion, with spacing out her imaging study She will not need imaging study until early spring of next year

## 2021-06-25 ENCOUNTER — Other Ambulatory Visit: Payer: Self-pay

## 2021-07-12 ENCOUNTER — Other Ambulatory Visit: Payer: Self-pay

## 2021-07-12 ENCOUNTER — Encounter: Payer: Self-pay | Admitting: Hematology and Oncology

## 2021-07-12 ENCOUNTER — Inpatient Hospital Stay: Payer: Medicare Other

## 2021-07-12 ENCOUNTER — Inpatient Hospital Stay (HOSPITAL_BASED_OUTPATIENT_CLINIC_OR_DEPARTMENT_OTHER): Payer: Medicare Other | Admitting: Hematology and Oncology

## 2021-07-12 ENCOUNTER — Inpatient Hospital Stay: Payer: Medicare Other | Attending: Hematology and Oncology

## 2021-07-12 DIAGNOSIS — Z9079 Acquired absence of other genital organ(s): Secondary | ICD-10-CM | POA: Insufficient documentation

## 2021-07-12 DIAGNOSIS — D6481 Anemia due to antineoplastic chemotherapy: Secondary | ICD-10-CM | POA: Diagnosis not present

## 2021-07-12 DIAGNOSIS — Z90722 Acquired absence of ovaries, bilateral: Secondary | ICD-10-CM | POA: Diagnosis not present

## 2021-07-12 DIAGNOSIS — Z5112 Encounter for antineoplastic immunotherapy: Secondary | ICD-10-CM | POA: Diagnosis present

## 2021-07-12 DIAGNOSIS — C787 Secondary malignant neoplasm of liver and intrahepatic bile duct: Secondary | ICD-10-CM | POA: Diagnosis present

## 2021-07-12 DIAGNOSIS — T451X5A Adverse effect of antineoplastic and immunosuppressive drugs, initial encounter: Secondary | ICD-10-CM | POA: Diagnosis not present

## 2021-07-12 DIAGNOSIS — I10 Essential (primary) hypertension: Secondary | ICD-10-CM

## 2021-07-12 DIAGNOSIS — E039 Hypothyroidism, unspecified: Secondary | ICD-10-CM

## 2021-07-12 DIAGNOSIS — Z90710 Acquired absence of both cervix and uterus: Secondary | ICD-10-CM | POA: Insufficient documentation

## 2021-07-12 DIAGNOSIS — Z79899 Other long term (current) drug therapy: Secondary | ICD-10-CM | POA: Insufficient documentation

## 2021-07-12 DIAGNOSIS — C541 Malignant neoplasm of endometrium: Secondary | ICD-10-CM | POA: Insufficient documentation

## 2021-07-12 LAB — CBC WITH DIFFERENTIAL/PLATELET
Abs Immature Granulocytes: 0.02 10*3/uL (ref 0.00–0.07)
Basophils Absolute: 0.1 10*3/uL (ref 0.0–0.1)
Basophils Relative: 1 %
Eosinophils Absolute: 0.2 10*3/uL (ref 0.0–0.5)
Eosinophils Relative: 3 %
HCT: 35.1 % — ABNORMAL LOW (ref 36.0–46.0)
Hemoglobin: 11.6 g/dL — ABNORMAL LOW (ref 12.0–15.0)
Immature Granulocytes: 0 %
Lymphocytes Relative: 27 %
Lymphs Abs: 1.9 10*3/uL (ref 0.7–4.0)
MCH: 26.5 pg (ref 26.0–34.0)
MCHC: 33 g/dL (ref 30.0–36.0)
MCV: 80.3 fL (ref 80.0–100.0)
Monocytes Absolute: 0.5 10*3/uL (ref 0.1–1.0)
Monocytes Relative: 8 %
Neutro Abs: 4.2 10*3/uL (ref 1.7–7.7)
Neutrophils Relative %: 61 %
Platelets: 238 10*3/uL (ref 150–400)
RBC: 4.37 MIL/uL (ref 3.87–5.11)
RDW: 13.4 % (ref 11.5–15.5)
WBC: 7 10*3/uL (ref 4.0–10.5)
nRBC: 0 % (ref 0.0–0.2)

## 2021-07-12 LAB — COMPREHENSIVE METABOLIC PANEL
ALT: 14 U/L (ref 0–44)
AST: 15 U/L (ref 15–41)
Albumin: 3.9 g/dL (ref 3.5–5.0)
Alkaline Phosphatase: 124 U/L (ref 38–126)
Anion gap: 7 (ref 5–15)
BUN: 22 mg/dL (ref 8–23)
CO2: 28 mmol/L (ref 22–32)
Calcium: 9.4 mg/dL (ref 8.9–10.3)
Chloride: 101 mmol/L (ref 98–111)
Creatinine, Ser: 0.68 mg/dL (ref 0.44–1.00)
GFR, Estimated: >60 mL/min (ref >60)
Glucose, Bld: 102 mg/dL — ABNORMAL HIGH (ref 70–99)
Potassium: 3.6 mmol/L (ref 3.5–5.1)
Sodium: 136 mmol/L (ref 135–145)
Total Bilirubin: 0.5 mg/dL (ref 0.3–1.2)
Total Protein: 7.1 g/dL (ref 6.5–8.1)

## 2021-07-12 LAB — T4, FREE: Free T4: 1.51 ng/dL — ABNORMAL HIGH (ref 0.61–1.12)

## 2021-07-12 LAB — TSH: TSH: 1.015 u[IU]/mL (ref 0.308–3.960)

## 2021-07-12 MED ORDER — SODIUM CHLORIDE 0.9 % IV SOLN
200.0000 mg | Freq: Once | INTRAVENOUS | Status: AC
  Administered 2021-07-12: 200 mg via INTRAVENOUS
  Filled 2021-07-12: qty 8

## 2021-07-12 MED ORDER — HEPARIN SOD (PORK) LOCK FLUSH 100 UNIT/ML IV SOLN
500.0000 [IU] | Freq: Once | INTRAVENOUS | Status: AC | PRN
  Administered 2021-07-12: 500 [IU]

## 2021-07-12 MED ORDER — SODIUM CHLORIDE 0.9% FLUSH
10.0000 mL | INTRAVENOUS | Status: DC | PRN
  Administered 2021-07-12: 10 mL

## 2021-07-12 MED ORDER — SODIUM CHLORIDE 0.9 % IV SOLN: Freq: Once | INTRAVENOUS | Status: AC

## 2021-07-12 MED ORDER — SODIUM CHLORIDE 0.9% FLUSH
10.0000 mL | Freq: Once | INTRAVENOUS | Status: AC
  Administered 2021-07-12: 10 mL

## 2021-07-12 NOTE — Patient Instructions (Signed)
[facility]COL[facility]  Discharge Instructions: ?Thank you for choosing [facility] to provide your oncology and hematology care.  ? ?If you have a lab appointment with the Cancer Center, please go directly to the Cancer Center and check in at the registration area. ?  ?Wear comfortable clothing and clothing appropriate for easy access to any Portacath or PICC line.  ? ?We strive to give you quality time with your provider. You may need to reschedule your appointment if you arrive late (15 or more minutes).  Arriving late affects you and other patients whose appointments are after yours.  Also, if you miss three or more appointments without notifying the office, you may be dismissed from the clinic at the provider?s discretion.    ?  ?For prescription refill requests, have your pharmacy contact our office and allow 72 hours for refills to be completed.   ? ?Today you received the following chemotherapy and/or immunotherapy agents: Keytruda ?  ?To help prevent nausea and vomiting after your treatment, we encourage you to take your nausea medication as directed. ? ?BELOW ARE SYMPTOMS THAT SHOULD BE REPORTED IMMEDIATELY: ?*FEVER GREATER THAN 100.4 F (38 ?C) OR HIGHER ?*CHILLS OR SWEATING ?*NAUSEA AND VOMITING THAT IS NOT CONTROLLED WITH YOUR NAUSEA MEDICATION ?*UNUSUAL SHORTNESS OF BREATH ?*UNUSUAL BRUISING OR BLEEDING ?*URINARY PROBLEMS (pain or burning when urinating, or frequent urination) ?*BOWEL PROBLEMS (unusual diarrhea, constipation, pain near the anus) ?TENDERNESS IN MOUTH AND THROAT WITH OR WITHOUT PRESENCE OF ULCERS (sore throat, sores in mouth, or a toothache) ?UNUSUAL RASH, SWELLING OR PAIN  ?UNUSUAL VAGINAL DISCHARGE OR ITCHING  ? ?Items with * indicate a potential emergency and should be followed up as soon as possible or go to the Emergency Department if any problems should occur. ? ?Please show the CHEMOTHERAPY ALERT CARD or IMMUNOTHERAPY ALERT CARD at check-in to the  Emergency Department and triage nurse. ? ?Should you have questions after your visit or need to cancel or reschedule your appointment, please contact [facility]  Dept: [phone]  and follow the prompts.  Office hours are 8:00 a.m. to 4:30 p.m. Monday - Friday. Please note that voicemails left after 4:00 p.m. may not be returned until the following business day.  We are closed weekends and major holidays. You have access to a nurse at all times for urgent questions. Please call the main number to the clinic Dept: [phone] and follow the prompts. ? ? ?For any non-urgent questions, you may also contact your provider using MyChart. We now offer e-Visits for anyone 18 and older to request care online for non-urgent symptoms. For details visit [email or web address]. ?  ?Also download the MyChart app! Go to the app store, search "MyChart", open the app, select [facility], and log in with your MyChart username and password. ? ?Due to Covid, a mask is required upon entering the hospital/clinic. If you do not have a mask, one will be given to you upon arrival. For doctor visits, patients may have 1 support person aged 18 or older with them. For treatment visits, patients cannot have anyone with them due to current Covid guidelines and our immunocompromised population.  ? ?

## 2021-07-12 NOTE — Assessment & Plan Note (Signed)
Her blood pressure is elevated today likely due to stress from the appointment Observe closely

## 2021-07-12 NOTE — Assessment & Plan Note (Signed)
She has mild intermittent anemia chronic illness Observed for now 

## 2021-07-12 NOTE — Assessment & Plan Note (Signed)
She is recovering well from recent knee surgery Per previous discussion, with spacing out her imaging study She will not need imaging study until early spring of next year I will see her again next month for further follow-up

## 2021-07-12 NOTE — Progress Notes (Signed)
[facility] OFF[facility] PROGRESS NOTE  Patient Care Team: [name], MD as PCP - General (Family Medicine)  ASSESSMENT & PLAN:  Recurrent carcinoma of endometrium [facility]) She is recovering well from recent knee surgery Per previous discussion, with spacing out her imaging study She will not need imaging study until early spring of next year I will see her again next month for further follow-up  Anemia due to antineoplastic chemotherapy She has mild intermittent anemia chronic illness Observed for now  Essential hypertension Her blood pressure is elevated today likely due to stress from the appointment Observe closely  No orders of the defined types were placed in this encounter.   All questions were answered. The patient knows to call the clinic with any problems, questions or concerns. The total time spent in the appointment was 20 minutes encounter with patients including review of chart and various tests results, discussions about plan of care and coordination of care plan   Heath Lark, MD 07/12/2021 2:27 PM  INTERVAL HISTORY: Please see below for problem oriented charting. she returns for treatment follow-up She returns for treatment She is doing well She has not gained weight since last time I saw her No new side effects from treatment so far  REVIEW OF SYSTEMS:   Constitutional: Denies fevers, chills or abnormal weight loss Eyes: Denies blurriness of vision Ears, nose, mouth, throat, and face: Denies mucositis or sore throat Respiratory: Denies cough, dyspnea or wheezes Cardiovascular: Denies palpitation, chest discomfort or lower extremity swelling Gastrointestinal:  Denies nausea, heartburn or change in bowel habits Skin: Denies abnormal skin rashes Lymphatics: Denies new lymphadenopathy or easy bruising Neurological:Denies numbness, tingling or new weaknesses Behavioral/Psych: Mood is stable, no new changes  All other systems were reviewed with the  patient and are negative.  I have reviewed the past medical history, past surgical history, social history and family history with the patient and they are unchanged from previous note.  ALLERGIES:  is allergic to latex and lisinopril.  MEDICATIONS:  Current Outpatient Medications  Medication Sig Dispense Refill   acetaminophen (TYLENOL) 325 MG tablet Take 650 mg by mouth every 6 (six) hours as needed for mild pain or moderate pain.      amoxicillin (AMOXIL) 500 MG capsule Take 2,000 mg by mouth See admin instructions. Prior to dental appointment, takes 4 tables prior to appt     Biotin w/ Vitamins C & E (HAIR/SKIN/NAILS PO) Take 1 tablet by mouth daily.     Calcium Carb-Cholecalciferol (CALCIUM 600 + D PO) Take 2 tablets by mouth daily.     diclofenac (VOLTAREN) 75 MG EC tablet Take 75 mg by mouth 2 (two) times daily as needed.     fluticasone (FLONASE) 50 MCG/ACT nasal spray Place 1 spray into both nostrils 2 (two) times daily as needed for allergies.   1   gabapentin (NEURONTIN) 600 MG tablet TAKE 1 TABLET BY MOUTH TWICE A DAY FOR RESTLESS LEGS AND NEUROPATHY. 60 tablet 11   levothyroxine (SYNTHROID) 200 MCG tablet Take 1 tablet (200 mcg total) by mouth daily. 90 tablet 3   levothyroxine (SYNTHROID) 25 MCG tablet Take 1 tablet (25 mcg total) by mouth daily before breakfast. Take this with the 200 mcg tablet only on Mondays, Wednesdays and Fridays 90 tablet 2   lidocaine-prilocaine (EMLA) cream APPLY TO AFFECTED AREA ONCE AS DIRECTED 30 g 3   loratadine (CLARITIN) 10 MG tablet Take 10 mg by mouth daily.     LORazepam (ATIVAN)  1 MG tablet Take 1 tablet (1 mg total) by mouth every 8 (eight) hours as needed for anxiety or sleep. 90 tablet 0   losartan-hydrochlorothiazide (HYZAAR) 100-12.5 MG tablet Take 0.5 tablets by mouth daily. 60 tablet 9   magnesium oxide (MAG-OX) 400 MG tablet Take 1 tablet by mouth 2 (two) times daily.     meclizine (ANTIVERT) 25 MG tablet Take 25 mg by mouth 3 (three)  times daily. Pt has vertigo  Take for 7 days     Melatonin 10 MG CAPS Take 10 mg by mouth at bedtime.     niacinamide 500 MG tablet Take 500 mg by mouth 3 (three) times daily with meals.     ondansetron (ZOFRAN) 8 MG tablet Take 1 tablet (8 mg total) by mouth every 8 (eight) hours as needed for nausea. 60 tablet 1   pramipexole (MIRAPEX) 0.5 MG tablet Take 1 mg by mouth at bedtime.     No current facility-administered medications for this visit.   Facility-Administered Medications Ordered in Other Visits  Medication Dose Route Frequency Provider Last Rate Last Admin   heparin lock flush 100 unit/mL  500 Units Intracatheter Once PRN Alvy Bimler, Malyk Girouard, MD       pembrolizumab (KEYTRUDA) 200 mg in sodium chloride 0.9 % 50 mL chemo infusion  200 mg Intravenous Once Alvy Bimler, Ayaat Jansma, MD       sodium chloride flush (NS) 0.9 % injection 10 mL  10 mL Intracatheter PRN Heath Lark, MD        SUMMARY OF ONCOLOGIC HISTORY: Oncology History Overview Note  MSI stable on June 2017 tissue but high from [facility] study from November 2017  05/15/16: ER is moderately positive (70%). PR is strongly positive (80%).   Recurrent carcinoma of endometrium ([place])  01/02/2016 Pathology Results   Uterus +/- tubes/ovaries, neoplastic, cervix ENDOMETRIAL ADENOCARCINOMA, FIGO GRADE 2 (4.7 CM) THE TUMOR INVADES LESS THAN ONE-HALF OF THE MYOMETRIUM (PT1A) ALL MARGINS OF RESECTION ARE NEGATIVE FOR CARCINOMA LEIOMYOMAS AND ADENOMYOSIS BILATERAL FALLOPIAN TUBES AND OVARIES: HISTOLOGICAL UNREMARKABLE 2. Lymph node, sentinel, biopsy, right obturator ONE BENIGN LYMPH NODE (0/1) 3. Lymph nodes, regional resection, left pelvic FOUR BENIGN LYMPH NODES (0/4)   01/02/2016 Surgery   Dr. Denman George performed robotic-assisted laparoscopic total hysterectomy with bilateral salpingoophorectomy, sentinel lymph node biopsy, lymphadenectomy      05/15/2016 Pathology Results   Vagina, biopsy, mid - ADENOCARCINOMA, SEE COMMENT. Microscopic  Comment The morphology along with the patient's history are consistent with recurrent endometrioid adenocarcinoma. The carcinoma has a similar appearance to the primary ([id number]).   05/20/2016 Imaging   Ct scan abdomen showed solid 2.5 cm peritoneal mass in the mid to anterior left pelvis, suspicious for peritoneal metastasis. 2. Small expansile low-attenuation filling defect in the left external iliac vein, cannot exclude a small deep venous thrombus. Consider correlation with left lower extremity venous Doppler scan. 3. Small simple fluid density structure in the left pelvic sidewall abutting the left external iliac vessels, favor a small postoperative seroma. 4. No ascites. 5. No lymphadenopathy.  No metastatic disease in the chest. 6. Aortic atherosclerosis.   06/12/2016 PET scan   Intensely hypermetabolic 2.1 cm central pelvic peritoneal mass just to the left of midline, consistent with peritoneal metastatic recurrence. No ascites. 2. No additional hypermetabolic sites of metastatic disease. 3. Diffuse thyroid hypermetabolism without discrete thyroid nodule, favoring thyroiditis. Recommend correlation with serum thyroid function tests.   06/24/2016 - 07/22/2016 Chemotherapy   The patient had weekly cisplatin. She has missed  several doses due to infection and pancytopenia   06/24/2016 - 09/04/2016 Radiation Therapy   She completed concurrent radiation therapy Radiation treatment dates:   IMRT : 06/24/16 - 08/01/16 HDR : 08/13/16, 08/20/16, 08/27/16, 09/04/16   Site/dose:   Pelvis treated to 55 Gy in 25 fractions (simultaneous integrated boost technique) Vaginal Cuff treated to 24 Gy in 4 fractions   08/15/2016 - 12/03/2016 Chemotherapy   She received 6 cycles of carboplatin/Taxol   09/27/2016 Imaging   Interval decrease in size of previously described solid peritoneal nodule within the left anterior pelvis. Near complete resolution of previously described low-attenuation structure along the left  pelvic sidewall. No evidence for metastatic disease in the chest. Aortic atherosclerosis.   12/30/2016 Imaging   Ct abdomen 1. Solitary left pelvic peritoneal implant is mildly decreased in size in the interval. 2. No new or progressive metastatic disease in the abdomen or pelvis. No ascites. 3. Aortic atherosclerosis.   04/02/2017 Imaging   Left lower quadrant peritoneal implant referenced on previous exam measures 1.7 x 1.3 cm, image 69 of series 2. Increased from 0.8 x 0.8 cm previously peer no new peritoneal implants identified.   Musculoskeletal: The degenerative disc disease noted within the lumbar spine.   IMPRESSION: 1. Solitary left pelvic peritoneal implant is increased in size in the interval. 2. No new sites of disease.  No ascites. 3. Aortic atherosclerosis   04/11/2017 PET scan   1. The left side of pelvis peritoneal implant has decreased in size and degree of FDG uptake compatible with response to therapy. No new areas of peritoneal disease identified. 2. Persistent diffuse increased uptake within the thyroid gland. Correlation with patient's thyroid function may be helpful.   05/26/2017 Imaging   1. Enlarging tumor implant along the left adnexa, currently 2.6 by 2.4 cm and previously 1.9 by 1.3 cm. No new tumor implant or other specific cause for the patient's pelvic symptoms is currently identified. 2.  Aortic Atherosclerosis (ICD10-I70.0). 3. Lumbar spondylosis and degenerative disc disease causing multilevel impingement.   06/04/2017 - 08/28/2017 Chemotherapy   The patient started letrozole and everolimus   08/26/2017 Imaging   Increased size of mass in the left adnexal region.  Several new small liver metastases in right hepatic lobe   09/08/2017 Imaging   LV EF: 60% -   65%   09/10/2017 Procedure   Technically successful right IJ power-injectable port catheter placement. Ready for routine use   12/04/2017 Imaging   1. Interval increase in size and number of  multiple lesions within the liver compatible with hepatic metastatic disease. 2. Interval increase in size mass within the left hemipelvis.   12/15/2017 -  Chemotherapy   The patient had pembrolizumab (KEYTRUDA) 200 mg in sodium chloride 0.9 % 50 mL chemo infusion, 200 mg, Intravenous, Once, 1 of 6 cycles Administration: 200 mg (12/15/2017)   for chemotherapy treatment.    12/21/2017 Imaging   1. Moderate left hydronephrosis and hydroureter, similar compared to most recent CT from May 2019; obstruction appears to be secondary to a left pelvic mass/metastatic focus which has increased in size since the prior CT. 2. Numerous hepatic metastatic lesions, suspect that some may be increased in size but difficult to further characterize without intravenous contrast. Increased size of left pelvic soft tissue mass/metastatic lesion. Mass abuts and possibly invades the sigmoid colon.     02/20/2018 Imaging   Interval decrease in hepatic metastases.  Decreased mass or lymphadenopathy in the left external iliac chain. Interval resolution  of left hydroureteronephrosis.  No new or progressive disease within the abdomen or pelvis.   05/16/2018 Imaging   05/16/2018 CT Abdomen IMPRESSION: 1. Slight interval decrease in size of hepatic metastatic disease. 2. Slight interval decrease in size centrally necrotic mass within the left external iliac region.   09/18/2018 Imaging   1. Today's study demonstrates a mixed response to therapy. Specifically, while the previously noted hepatic metastases appear decreased, the soft tissue mass along the left pelvic sidewall appears slightly larger than the prior study. No new metastatic disease is noted elsewhere in the abdomen or pelvis. 2. Aortic atherosclerosis. 3. Mild cardiomegaly. 4. Additional incidental findings, as above     03/12/2019 Imaging   1. Overall improvement with reduced size of the hepatic metastatic lesions, and reduced size of the irregular soft  tissue mass in the left pelvis. 2. Other imaging findings of potential clinical significance: Mild distal esophageal wall thickening, cannot exclude esophagitis. Aortic Atherosclerosis (ICD10-I70.0). Lumbar impingement at L5-S1.     09/09/2019 Imaging   1. Stable size and appearance of the somewhat irregular left adnexal mass, currently 2.8 by 1.9 cm. 2. The scattered hepatic metastatic lesions are stable from 03/12/2019, and represent effectively treated metastatic lesions. 3. Other imaging findings of potential clinical significance: Airway thickening is present, suggesting bronchitis or reactive airways disease. Lumbar spondylosis and degenerative disc disease causing impingement at L4-5 and L5-S1. Small umbilical hernia contains adipose tissue.   Aortic Atherosclerosis (ICD10-I70.0).   03/07/2020 Imaging   1. Stable appearance of LEFT pelvic mass without new signs of disease. Mass remains closely approximated to the colon, in the vicinity of the distal LEFT ureter and the iliac vessels. 2. Stable signs of hepatic metastatic disease with calcified lesion at the dome of the liver and subtle areas of low attenuation elsewhere which show no change.   09/12/2020 Imaging   1. Stable appearance of the partially calcified LEFT pelvic sidewall soft tissue mass with unchanged involvement of the sigmoid colon, iliac vessels and close proximity to the left ureter. No new signs of disease. 2. Stable signs of the hepatic metastatic disease with calcified lesion at the hepatic dome and subcentimeter area of low attenuation in the right hepatic lobe. 3. No evidence of new or enlarging abdominopelvic metastatic disease. 4. Aortic atherosclerosis.   03/15/2021 Imaging   1. Stable exam. No new or progressive interval findings. 2. Spiculated calcified soft tissue lesion along the left pelvic sidewall is stable in the interval. This lesion tethers the sigmoid colon and is in close proximity to the left ureter and  left external iliac vasculature. 3. Stable appearance of the calcified lesion at the dome of the liver and hypoattenuating lesion in the right liver. 4. Small umbilical hernia contains only fat.     PHYSICAL EXAMINATION: ECOG PERFORMANCE STATUS: 1 - Symptomatic but completely ambulatory  Vitals:   07/12/21 1253  BP: (!) 167/80  Pulse: 71  Resp: 17  Temp: (!) 97.4 F (36.3 C)  SpO2: 100%   Filed Weights   07/12/21 1253  Weight: 213 lb 6.4 oz (96.8 kg)    GENERAL:alert, no distress and comfortable SKIN: skin color, texture, turgor are normal, no rashes or significant lesions EYES: normal, Conjunctiva are pink and non-injected, sclera clear OROPHARYNX:no exudate, no erythema and lips, buccal mucosa, and tongue normal  NECK: supple, thyroid normal size, non-tender, without nodularity LYMPH:  no palpable lymphadenopathy in the cervical, axillary or inguinal LUNGS: clear to auscultation and percussion with normal breathing effort  HEART: regular rate & rhythm and no murmurs and no lower extremity edema ABDOMEN:abdomen soft, non-tender and normal bowel sounds Musculoskeletal:no cyanosis of digits and no clubbing  NEURO: alert & oriented x 3 with fluent speech, no focal motor/sensory deficits  LABORATORY DATA:  I have reviewed the data as listed    Component Value Date/Time   NA 136 07/12/2021 1210   NA 141 07/09/2017 1312   K 3.6 07/12/2021 1210   K 3.9 07/09/2017 1312   CL 101 07/12/2021 1210   CO2 28 07/12/2021 1210   CO2 27 07/09/2017 1312   GLUCOSE 102 (H) 07/12/2021 1210   GLUCOSE 109 07/09/2017 1312   BUN 22 07/12/2021 1210   BUN 16.4 07/09/2017 1312   CREATININE 0.68 07/12/2021 1210   CREATININE 0.66 01/25/2021 1149   CREATININE 0.8 07/09/2017 1312   CALCIUM 9.4 07/12/2021 1210   CALCIUM 9.2 07/09/2017 1312   PROT 7.1 07/12/2021 1210   PROT 7.1 07/09/2017 1312   ALBUMIN 3.9 07/12/2021 1210   ALBUMIN 3.1 (L) 07/09/2017 1312   AST 15 07/12/2021 1210   AST 21  01/25/2021 1149   AST 35 (H) 07/09/2017 1312   ALT 14 07/12/2021 1210   ALT 20 01/25/2021 1149   ALT 43 07/09/2017 1312   ALKPHOS 124 07/12/2021 1210   ALKPHOS 182 (H) 07/09/2017 1312   BILITOT 0.5 07/12/2021 1210   BILITOT 0.6 01/25/2021 1149   BILITOT 0.34 07/09/2017 1312   GFRNONAA >60 07/12/2021 1210   GFRNONAA >60 01/25/2021 1149   GFRAA >60 03/29/2020 1105   GFRAA >60 01/04/2019 1351    No results found for: SPEP, UPEP  Lab Results  Component Value Date   WBC 7.0 07/12/2021   NEUTROABS 4.2 07/12/2021   HGB 11.6 (L) 07/12/2021   HCT 35.1 (L) 07/12/2021   MCV 80.3 07/12/2021   PLT 238 07/12/2021      Chemistry      Component Value Date/Time   NA 136 07/12/2021 1210   NA 141 07/09/2017 1312   K 3.6 07/12/2021 1210   K 3.9 07/09/2017 1312   CL 101 07/12/2021 1210   CO2 28 07/12/2021 1210   CO2 27 07/09/2017 1312   BUN 22 07/12/2021 1210   BUN 16.4 07/09/2017 1312   CREATININE 0.68 07/12/2021 1210   CREATININE 0.66 01/25/2021 1149   CREATININE 0.8 07/09/2017 1312      Component Value Date/Time   CALCIUM 9.4 07/12/2021 1210   CALCIUM 9.2 07/09/2017 1312   ALKPHOS 124 07/12/2021 1210   ALKPHOS 182 (H) 07/09/2017 1312   AST 15 07/12/2021 1210   AST 21 01/25/2021 1149   AST 35 (H) 07/09/2017 1312   ALT 14 07/12/2021 1210   ALT 20 01/25/2021 1149   ALT 43 07/09/2017 1312   BILITOT 0.5 07/12/2021 1210   BILITOT 0.6 01/25/2021 1149   BILITOT 0.34 07/09/2017 1312

## 2021-08-02 ENCOUNTER — Inpatient Hospital Stay: Payer: Medicare Other

## 2021-08-02 ENCOUNTER — Other Ambulatory Visit: Payer: Self-pay

## 2021-08-02 VITALS — BP 158/80 | HR 65 | Temp 98.3°F | Resp 18

## 2021-08-02 DIAGNOSIS — Z5112 Encounter for antineoplastic immunotherapy: Secondary | ICD-10-CM | POA: Diagnosis not present

## 2021-08-02 DIAGNOSIS — C541 Malignant neoplasm of endometrium: Secondary | ICD-10-CM

## 2021-08-02 DIAGNOSIS — E039 Hypothyroidism, unspecified: Secondary | ICD-10-CM

## 2021-08-02 DIAGNOSIS — C787 Secondary malignant neoplasm of liver and intrahepatic bile duct: Secondary | ICD-10-CM

## 2021-08-02 LAB — CBC WITH DIFFERENTIAL/PLATELET
Abs Immature Granulocytes: 0.02 10*3/uL (ref 0.00–0.07)
Basophils Absolute: 0.1 10*3/uL (ref 0.0–0.1)
Basophils Relative: 1 %
Eosinophils Absolute: 0.3 10*3/uL (ref 0.0–0.5)
Eosinophils Relative: 4 %
HCT: 34.8 % — ABNORMAL LOW (ref 36.0–46.0)
Hemoglobin: 11.3 g/dL — ABNORMAL LOW (ref 12.0–15.0)
Immature Granulocytes: 0 %
Lymphocytes Relative: 24 %
Lymphs Abs: 2.1 10*3/uL (ref 0.7–4.0)
MCH: 25.9 pg — ABNORMAL LOW (ref 26.0–34.0)
MCHC: 32.5 g/dL (ref 30.0–36.0)
MCV: 79.6 fL — ABNORMAL LOW (ref 80.0–100.0)
Monocytes Absolute: 0.6 10*3/uL (ref 0.1–1.0)
Monocytes Relative: 7 %
Neutro Abs: 5.7 10*3/uL (ref 1.7–7.7)
Neutrophils Relative %: 64 %
Platelets: 232 10*3/uL (ref 150–400)
RBC: 4.37 MIL/uL (ref 3.87–5.11)
RDW: 13.6 % (ref 11.5–15.5)
WBC: 8.8 10*3/uL (ref 4.0–10.5)
nRBC: 0 % (ref 0.0–0.2)

## 2021-08-02 LAB — COMPREHENSIVE METABOLIC PANEL
ALT: 13 U/L (ref 0–44)
AST: 17 U/L (ref 15–41)
Albumin: 3.9 g/dL (ref 3.5–5.0)
Alkaline Phosphatase: 111 U/L (ref 38–126)
Anion gap: 6 (ref 5–15)
BUN: 25 mg/dL — ABNORMAL HIGH (ref 8–23)
CO2: 27 mmol/L (ref 22–32)
Calcium: 9.5 mg/dL (ref 8.9–10.3)
Chloride: 103 mmol/L (ref 98–111)
Creatinine, Ser: 0.7 mg/dL (ref 0.44–1.00)
GFR, Estimated: >60 mL/min (ref >60)
Glucose, Bld: 92 mg/dL (ref 70–99)
Potassium: 3.7 mmol/L (ref 3.5–5.1)
Sodium: 136 mmol/L (ref 135–145)
Total Bilirubin: 0.5 mg/dL (ref 0.3–1.2)
Total Protein: 7 g/dL (ref 6.5–8.1)

## 2021-08-02 LAB — TSH: TSH: 0.724 u[IU]/mL (ref 0.308–3.960)

## 2021-08-02 LAB — T4, FREE: Free T4: 1.38 ng/dL — ABNORMAL HIGH (ref 0.61–1.12)

## 2021-08-02 MED ORDER — SODIUM CHLORIDE 0.9% FLUSH
10.0000 mL | INTRAVENOUS | Status: DC | PRN
  Administered 2021-08-02: 10 mL

## 2021-08-02 MED ORDER — SODIUM CHLORIDE 0.9 % IV SOLN
200.0000 mg | Freq: Once | INTRAVENOUS | Status: AC
  Administered 2021-08-02: 200 mg via INTRAVENOUS
  Filled 2021-08-02: qty 200

## 2021-08-02 MED ORDER — HEPARIN SOD (PORK) LOCK FLUSH 100 UNIT/ML IV SOLN
500.0000 [IU] | Freq: Once | INTRAVENOUS | Status: AC | PRN
  Administered 2021-08-02: 500 [IU]

## 2021-08-02 MED ORDER — SODIUM CHLORIDE 0.9 % IV SOLN: Freq: Once | INTRAVENOUS | Status: AC

## 2021-08-02 MED ORDER — SODIUM CHLORIDE 0.9% FLUSH
10.0000 mL | Freq: Once | INTRAVENOUS | Status: AC
  Administered 2021-08-02: 10 mL

## 2021-08-23 ENCOUNTER — Inpatient Hospital Stay (HOSPITAL_BASED_OUTPATIENT_CLINIC_OR_DEPARTMENT_OTHER): Payer: Medicare Other | Admitting: Hematology and Oncology

## 2021-08-23 ENCOUNTER — Inpatient Hospital Stay: Payer: Medicare Other

## 2021-08-23 ENCOUNTER — Other Ambulatory Visit: Payer: Self-pay

## 2021-08-23 ENCOUNTER — Encounter: Payer: Self-pay | Admitting: Hematology and Oncology

## 2021-08-23 ENCOUNTER — Inpatient Hospital Stay: Payer: Medicare Other | Attending: Hematology and Oncology

## 2021-08-23 DIAGNOSIS — C787 Secondary malignant neoplasm of liver and intrahepatic bile duct: Secondary | ICD-10-CM | POA: Insufficient documentation

## 2021-08-23 DIAGNOSIS — C541 Malignant neoplasm of endometrium: Secondary | ICD-10-CM | POA: Diagnosis present

## 2021-08-23 DIAGNOSIS — Z90722 Acquired absence of ovaries, bilateral: Secondary | ICD-10-CM | POA: Insufficient documentation

## 2021-08-23 DIAGNOSIS — Z9079 Acquired absence of other genital organ(s): Secondary | ICD-10-CM | POA: Diagnosis not present

## 2021-08-23 DIAGNOSIS — D638 Anemia in other chronic diseases classified elsewhere: Secondary | ICD-10-CM | POA: Insufficient documentation

## 2021-08-23 DIAGNOSIS — Z90710 Acquired absence of both cervix and uterus: Secondary | ICD-10-CM | POA: Diagnosis not present

## 2021-08-23 DIAGNOSIS — Z5112 Encounter for antineoplastic immunotherapy: Secondary | ICD-10-CM | POA: Insufficient documentation

## 2021-08-23 DIAGNOSIS — Z79899 Other long term (current) drug therapy: Secondary | ICD-10-CM | POA: Insufficient documentation

## 2021-08-23 DIAGNOSIS — E039 Hypothyroidism, unspecified: Secondary | ICD-10-CM | POA: Insufficient documentation

## 2021-08-23 LAB — CBC WITH DIFFERENTIAL/PLATELET
Abs Immature Granulocytes: 0.02 10*3/uL (ref 0.00–0.07)
Basophils Absolute: 0 10*3/uL (ref 0.0–0.1)
Basophils Relative: 1 %
Eosinophils Absolute: 0.3 10*3/uL (ref 0.0–0.5)
Eosinophils Relative: 5 %
HCT: 33.8 % — ABNORMAL LOW (ref 36.0–46.0)
Hemoglobin: 11.2 g/dL — ABNORMAL LOW (ref 12.0–15.0)
Immature Granulocytes: 0 %
Lymphocytes Relative: 21 %
Lymphs Abs: 1.3 10*3/uL (ref 0.7–4.0)
MCH: 26 pg (ref 26.0–34.0)
MCHC: 33.1 g/dL (ref 30.0–36.0)
MCV: 78.6 fL — ABNORMAL LOW (ref 80.0–100.0)
Monocytes Absolute: 0.5 10*3/uL (ref 0.1–1.0)
Monocytes Relative: 8 %
Neutro Abs: 4.1 10*3/uL (ref 1.7–7.7)
Neutrophils Relative %: 65 %
Platelets: 194 10*3/uL (ref 150–400)
RBC: 4.3 MIL/uL (ref 3.87–5.11)
RDW: 14.1 % (ref 11.5–15.5)
WBC: 6.4 10*3/uL (ref 4.0–10.5)
nRBC: 0 % (ref 0.0–0.2)

## 2021-08-23 LAB — COMPREHENSIVE METABOLIC PANEL
ALT: 14 U/L (ref 0–44)
AST: 15 U/L (ref 15–41)
Albumin: 3.8 g/dL (ref 3.5–5.0)
Alkaline Phosphatase: 102 U/L (ref 38–126)
Anion gap: 5 (ref 5–15)
BUN: 27 mg/dL — ABNORMAL HIGH (ref 8–23)
CO2: 27 mmol/L (ref 22–32)
Calcium: 9.3 mg/dL (ref 8.9–10.3)
Chloride: 107 mmol/L (ref 98–111)
Creatinine, Ser: 0.71 mg/dL (ref 0.44–1.00)
GFR, Estimated: >60 mL/min (ref >60)
Glucose, Bld: 100 mg/dL — ABNORMAL HIGH (ref 70–99)
Potassium: 3.7 mmol/L (ref 3.5–5.1)
Sodium: 139 mmol/L (ref 135–145)
Total Bilirubin: 0.5 mg/dL (ref 0.3–1.2)
Total Protein: 6.6 g/dL (ref 6.5–8.1)

## 2021-08-23 LAB — T4, FREE: Free T4: 2.51 ng/dL — ABNORMAL HIGH (ref 0.61–1.12)

## 2021-08-23 LAB — TSH: TSH: 0.25 u[IU]/mL — ABNORMAL LOW (ref 0.308–3.960)

## 2021-08-23 MED ORDER — SODIUM CHLORIDE 0.9 % IV SOLN: Freq: Once | INTRAVENOUS | Status: AC

## 2021-08-23 MED ORDER — HEPARIN SOD (PORK) LOCK FLUSH 100 UNIT/ML IV SOLN
500.0000 [IU] | Freq: Once | INTRAVENOUS | Status: AC | PRN
  Administered 2021-08-23: 500 [IU]

## 2021-08-23 MED ORDER — SODIUM CHLORIDE 0.9% FLUSH
10.0000 mL | INTRAVENOUS | Status: DC | PRN
  Administered 2021-08-23: 10 mL

## 2021-08-23 MED ORDER — LEVOTHYROXINE SODIUM 200 MCG PO TABS: 200.0000 ug | ORAL_TABLET | Freq: Every day | ORAL | 3 refills | Status: DC

## 2021-08-23 MED ORDER — LORAZEPAM 1 MG PO TABS: 1.0000 mg | ORAL_TABLET | Freq: Three times a day (TID) | ORAL | 0 refills | Status: DC | PRN

## 2021-08-23 MED ORDER — SODIUM CHLORIDE 0.9% FLUSH
10.0000 mL | Freq: Once | INTRAVENOUS | Status: AC
  Administered 2021-08-23: 10 mL

## 2021-08-23 MED ORDER — SODIUM CHLORIDE 0.9 % IV SOLN
200.0000 mg | Freq: Once | INTRAVENOUS | Status: AC
  Administered 2021-08-23: 200 mg via INTRAVENOUS
  Filled 2021-08-23: qty 8

## 2021-08-23 MED ORDER — LEVOTHYROXINE SODIUM 25 MCG PO TABS: 25.0000 ug | ORAL_TABLET | Freq: Every day | ORAL | 2 refills | Status: DC

## 2021-08-23 NOTE — Assessment & Plan Note (Signed)
She is doing very well without major side effects of treatment apart from changes in her thyroid function Due to stability of her imaging study, I plan to switch the interval of imaging study to 9 months, due next June 2023

## 2021-08-23 NOTE — Progress Notes (Signed)
[place] OFFICE PROGRESS NOTE  Patient Care Team: [name], MD as PCP - General (Family Medicine)  ASSESSMENT & PLAN:  Recurrent carcinoma of endometrium [facility]) She is doing very well without major side effects of treatment apart from changes in her thyroid function Due to stability of her imaging study, I plan to switch the interval of imaging study to 9 months, due next June 2023  Acquired hypothyroidism TSH is suppressed I will adjust the dose of her Synthroid   Anemia, chronic disease She has mild intermittent anemia chronic illness Observed for now  No orders of the defined types were placed in this encounter.   All questions were answered. The patient knows to call the clinic with any problems, questions or concerns. The total time spent in the appointment was 20 minutes encounter with patients including review of chart and various tests results, discussions about plan of care and coordination of care plan   Heath Lark, MD 08/23/2021 12:14 PM  INTERVAL HISTORY: Please see below for problem oriented charting. she returns for treatment follow-up on pembrolizumab for recurrent uterine cancer She is doing well She is attempting to lose weight through dietary modification but so far has not been successful Denies abdominal pain or changes in bowel habits  REVIEW OF SYSTEMS:   Constitutional: Denies fevers, chills or abnormal weight loss Eyes: Denies blurriness of vision Ears, nose, mouth, throat, and face: Denies mucositis or sore throat Respiratory: Denies cough, dyspnea or wheezes Cardiovascular: Denies palpitation, chest discomfort or lower extremity swelling Gastrointestinal:  Denies nausea, heartburn or change in bowel habits Skin: Denies abnormal skin rashes Lymphatics: Denies new lymphadenopathy or easy bruising Neurological:Denies numbness, tingling or new weaknesses Behavioral/Psych: Mood is stable, no new changes  All other systems were  reviewed with the patient and are negative.  I have reviewed the past medical history, past surgical history, social history and family history with the patient and they are unchanged from previous note.  ALLERGIES:  is allergic to latex and lisinopril.  MEDICATIONS:  Current Outpatient Medications  Medication Sig Dispense Refill   acetaminophen (TYLENOL) 325 MG tablet Take 650 mg by mouth every 6 (six) hours as needed for mild pain or moderate pain.      amoxicillin (AMOXIL) 500 MG capsule Take 2,000 mg by mouth See admin instructions. Prior to dental appointment, takes 4 tables prior to appt     Biotin w/ Vitamins C & E (HAIR/SKIN/NAILS PO) Take 1 tablet by mouth daily.     Calcium Carb-Cholecalciferol (CALCIUM 600 + D PO) Take 2 tablets by mouth daily.     diclofenac (VOLTAREN) 75 MG EC tablet Take 75 mg by mouth 2 (two) times daily as needed.     fluticasone (FLONASE) 50 MCG/ACT nasal spray Place 1 spray into both nostrils 2 (two) times daily as needed for allergies.   1   gabapentin (NEURONTIN) 600 MG tablet TAKE 1 TABLET BY MOUTH TWICE A DAY FOR RESTLESS LEGS AND NEUROPATHY. 60 tablet 11   levothyroxine (SYNTHROID) 200 MCG tablet Take 1 tablet (200 mcg total) by mouth daily. 90 tablet 3   levothyroxine (SYNTHROID) 25 MCG tablet Take 1 tablet (25 mcg total) by mouth daily before breakfast. Take this with the 200 mcg tablet only on Mondays, Wednesdays and Fridays 90 tablet 2   lidocaine-prilocaine (EMLA) cream APPLY TO AFFECTED AREA ONCE AS DIRECTED 30 g 3   loratadine (CLARITIN) 10 MG tablet Take 10 mg by mouth daily.  LORazepam (ATIVAN) 1 MG tablet Take 1 tablet (1 mg total) by mouth every 8 (eight) hours as needed for anxiety or sleep. 90 tablet 0   losartan-hydrochlorothiazide (HYZAAR) 100-12.5 MG tablet Take 0.5 tablets by mouth daily. 60 tablet 9   magnesium oxide (MAG-OX) 400 MG tablet Take 1 tablet by mouth 2 (two) times daily.     meclizine (ANTIVERT) 25 MG tablet Take 25 mg by  mouth 3 (three) times daily. Pt has vertigo  Take for 7 days     Melatonin 10 MG CAPS Take 10 mg by mouth at bedtime.     niacinamide 500 MG tablet Take 500 mg by mouth 3 (three) times daily with meals.     ondansetron (ZOFRAN) 8 MG tablet Take 1 tablet (8 mg total) by mouth every 8 (eight) hours as needed for nausea. 60 tablet 1   pramipexole (MIRAPEX) 0.5 MG tablet Take 1 mg by mouth at bedtime.     No current facility-administered medications for this visit.   Facility-Administered Medications Ordered in Other Visits  Medication Dose Route Frequency Provider Last Rate Last Admin   heparin lock flush 100 unit/mL  500 Units Intracatheter Once PRN Alvy Bimler, Gregrey Bloyd, MD       pembrolizumab (KEYTRUDA) 200 mg in sodium chloride 0.9 % 50 mL chemo infusion  200 mg Intravenous Once Heath Lark, MD 116 mL/hr at 08/23/21 1206 200 mg at 08/23/21 1206   sodium chloride flush (NS) 0.9 % injection 10 mL  10 mL Intracatheter PRN Heath Lark, MD        SUMMARY OF ONCOLOGIC HISTORY: Oncology History Overview Note  MSI stable on June 2017 tissue but high from [facility] study from November 2017  05/15/16: ER is moderately positive (70%). PR is strongly positive (80%).   Recurrent carcinoma of endometrium ([place])  01/02/2016 Pathology Results   Uterus +/- tubes/ovaries, neoplastic, cervix ENDOMETRIAL ADENOCARCINOMA, FIGO GRADE 2 (4.7 CM) THE TUMOR INVADES LESS THAN ONE-HALF OF THE MYOMETRIUM (PT1A) ALL MARGINS OF RESECTION ARE NEGATIVE FOR CARCINOMA LEIOMYOMAS AND ADENOMYOSIS BILATERAL FALLOPIAN TUBES AND OVARIES: HISTOLOGICAL UNREMARKABLE 2. Lymph node, sentinel, biopsy, right obturator ONE BENIGN LYMPH NODE (0/1) 3. Lymph nodes, regional resection, left pelvic FOUR BENIGN LYMPH NODES (0/4)   01/02/2016 Surgery   Dr. Denman George performed robotic-assisted laparoscopic total hysterectomy with bilateral salpingoophorectomy, sentinel lymph node biopsy, lymphadenectomy      05/15/2016 Pathology Results    Vagina, biopsy, mid - ADENOCARCINOMA, SEE COMMENT. Microscopic Comment The morphology along with the patient's history are consistent with recurrent endometrioid adenocarcinoma. The carcinoma has a similar appearance to the primary ([id number]).   05/20/2016 Imaging   Ct scan abdomen showed solid 2.5 cm peritoneal mass in the mid to anterior left pelvis, suspicious for peritoneal metastasis. 2. Small expansile low-attenuation filling defect in the left external iliac vein, cannot exclude a small deep venous thrombus. Consider correlation with left lower extremity venous Doppler scan. 3. Small simple fluid density structure in the left pelvic sidewall abutting the left external iliac vessels, favor a small postoperative seroma. 4. No ascites. 5. No lymphadenopathy.  No metastatic disease in the chest. 6. Aortic atherosclerosis.   06/12/2016 PET scan   Intensely hypermetabolic 2.1 cm central pelvic peritoneal mass just to the left of midline, consistent with peritoneal metastatic recurrence. No ascites. 2. No additional hypermetabolic sites of metastatic disease. 3. Diffuse thyroid hypermetabolism without discrete thyroid nodule, favoring thyroiditis. Recommend correlation with serum thyroid function tests.   06/24/2016 - 07/22/2016 Chemotherapy  The patient had weekly cisplatin. She has missed several doses due to infection and pancytopenia   06/24/2016 - 09/04/2016 Radiation Therapy   She completed concurrent radiation therapy Radiation treatment dates:   IMRT : 06/24/16 - 08/01/16 HDR : 08/13/16, 08/20/16, 08/27/16, 09/04/16   Site/dose:   Pelvis treated to 55 Gy in 25 fractions (simultaneous integrated boost technique) Vaginal Cuff treated to 24 Gy in 4 fractions   08/15/2016 - 12/03/2016 Chemotherapy   She received 6 cycles of carboplatin/Taxol   09/27/2016 Imaging   Interval decrease in size of previously described solid peritoneal nodule within the left anterior pelvis. Near complete resolution  of previously described low-attenuation structure along the left pelvic sidewall. No evidence for metastatic disease in the chest. Aortic atherosclerosis.   12/30/2016 Imaging   Ct abdomen 1. Solitary left pelvic peritoneal implant is mildly decreased in size in the interval. 2. No new or progressive metastatic disease in the abdomen or pelvis. No ascites. 3. Aortic atherosclerosis.   04/02/2017 Imaging   Left lower quadrant peritoneal implant referenced on previous exam measures 1.7 x 1.3 cm, image 69 of series 2. Increased from 0.8 x 0.8 cm previously peer no new peritoneal implants identified.   Musculoskeletal: The degenerative disc disease noted within the lumbar spine.   IMPRESSION: 1. Solitary left pelvic peritoneal implant is increased in size in the interval. 2. No new sites of disease.  No ascites. 3. Aortic atherosclerosis   04/11/2017 PET scan   1. The left side of pelvis peritoneal implant has decreased in size and degree of FDG uptake compatible with response to therapy. No new areas of peritoneal disease identified. 2. Persistent diffuse increased uptake within the thyroid gland. Correlation with patient's thyroid function may be helpful.   05/26/2017 Imaging   1. Enlarging tumor implant along the left adnexa, currently 2.6 by 2.4 cm and previously 1.9 by 1.3 cm. No new tumor implant or other specific cause for the patient's pelvic symptoms is currently identified. 2.  Aortic Atherosclerosis (ICD10-I70.0). 3. Lumbar spondylosis and degenerative disc disease causing multilevel impingement.   06/04/2017 - 08/28/2017 Chemotherapy   The patient started letrozole and everolimus   08/26/2017 Imaging   Increased size of mass in the left adnexal region.  Several new small liver metastases in right hepatic lobe   09/08/2017 Imaging   LV EF: 60% -   65%   09/10/2017 Procedure   Technically successful right IJ power-injectable port catheter placement. Ready for routine use    12/04/2017 Imaging   1. Interval increase in size and number of multiple lesions within the liver compatible with hepatic metastatic disease. 2. Interval increase in size mass within the left hemipelvis.   12/15/2017 -  Chemotherapy   The patient had pembrolizumab (KEYTRUDA) 200 mg in sodium chloride 0.9 % 50 mL chemo infusion, 200 mg, Intravenous, Once, 1 of 6 cycles Administration: 200 mg (12/15/2017)   for chemotherapy treatment.    12/21/2017 Imaging   1. Moderate left hydronephrosis and hydroureter, similar compared to most recent CT from May 2019; obstruction appears to be secondary to a left pelvic mass/metastatic focus which has increased in size since the prior CT. 2. Numerous hepatic metastatic lesions, suspect that some may be increased in size but difficult to further characterize without intravenous contrast. Increased size of left pelvic soft tissue mass/metastatic lesion. Mass abuts and possibly invades the sigmoid colon.     02/20/2018 Imaging   Interval decrease in hepatic metastases.  Decreased mass or lymphadenopathy  in the left external iliac chain. Interval resolution of left hydroureteronephrosis.  No new or progressive disease within the abdomen or pelvis.   05/16/2018 Imaging   05/16/2018 CT Abdomen IMPRESSION: 1. Slight interval decrease in size of hepatic metastatic disease. 2. Slight interval decrease in size centrally necrotic mass within the left external iliac region.   09/18/2018 Imaging   1. Today's study demonstrates a mixed response to therapy. Specifically, while the previously noted hepatic metastases appear decreased, the soft tissue mass along the left pelvic sidewall appears slightly larger than the prior study. No new metastatic disease is noted elsewhere in the abdomen or pelvis. 2. Aortic atherosclerosis. 3. Mild cardiomegaly. 4. Additional incidental findings, as above     03/12/2019 Imaging   1. Overall improvement with reduced size of the  hepatic metastatic lesions, and reduced size of the irregular soft tissue mass in the left pelvis. 2. Other imaging findings of potential clinical significance: Mild distal esophageal wall thickening, cannot exclude esophagitis. Aortic Atherosclerosis (ICD10-I70.0). Lumbar impingement at L5-S1.     09/09/2019 Imaging   1. Stable size and appearance of the somewhat irregular left adnexal mass, currently 2.8 by 1.9 cm. 2. The scattered hepatic metastatic lesions are stable from 03/12/2019, and represent effectively treated metastatic lesions. 3. Other imaging findings of potential clinical significance: Airway thickening is present, suggesting bronchitis or reactive airways disease. Lumbar spondylosis and degenerative disc disease causing impingement at L4-5 and L5-S1. Small umbilical hernia contains adipose tissue.   Aortic Atherosclerosis (ICD10-I70.0).   03/07/2020 Imaging   1. Stable appearance of LEFT pelvic mass without new signs of disease. Mass remains closely approximated to the colon, in the vicinity of the distal LEFT ureter and the iliac vessels. 2. Stable signs of hepatic metastatic disease with calcified lesion at the dome of the liver and subtle areas of low attenuation elsewhere which show no change.   09/12/2020 Imaging   1. Stable appearance of the partially calcified LEFT pelvic sidewall soft tissue mass with unchanged involvement of the sigmoid colon, iliac vessels and close proximity to the left ureter. No new signs of disease. 2. Stable signs of the hepatic metastatic disease with calcified lesion at the hepatic dome and subcentimeter area of low attenuation in the right hepatic lobe. 3. No evidence of new or enlarging abdominopelvic metastatic disease. 4. Aortic atherosclerosis.   03/15/2021 Imaging   1. Stable exam. No new or progressive interval findings. 2. Spiculated calcified soft tissue lesion along the left pelvic sidewall is stable in the interval. This lesion tethers  the sigmoid colon and is in close proximity to the left ureter and left external iliac vasculature. 3. Stable appearance of the calcified lesion at the dome of the liver and hypoattenuating lesion in the right liver. 4. Small umbilical hernia contains only fat.     PHYSICAL EXAMINATION: ECOG PERFORMANCE STATUS: 1 - Symptomatic but completely ambulatory  Vitals:   08/23/21 1101  BP: (!) 140/58  Pulse: 80  Resp: 18  Temp: 97.6 F (36.4 C)  SpO2: 98%   Filed Weights   08/23/21 1101  Weight: 213 lb 9.6 oz (96.9 kg)    GENERAL:alert, no distress and comfortable SKIN: skin color, texture, turgor are normal, no rashes or significant lesions EYES: normal, Conjunctiva are pink and non-injected, sclera clear OROPHARYNX:no exudate, no erythema and lips, buccal mucosa, and tongue normal  NECK: supple, thyroid normal size, non-tender, without nodularity LYMPH:  no palpable lymphadenopathy in the cervical, axillary or inguinal LUNGS: clear to  auscultation and percussion with normal breathing effort HEART: regular rate & rhythm and no murmurs and no lower extremity edema ABDOMEN:abdomen soft, non-tender and normal bowel sounds Musculoskeletal:no cyanosis of digits and no clubbing  NEURO: alert & oriented x 3 with fluent speech, no focal motor/sensory deficits  LABORATORY DATA:  I have reviewed the data as listed    Component Value Date/Time   NA 139 08/23/2021 1041   NA 141 07/09/2017 1312   K 3.7 08/23/2021 1041   K 3.9 07/09/2017 1312   CL 107 08/23/2021 1041   CO2 27 08/23/2021 1041   CO2 27 07/09/2017 1312   GLUCOSE 100 (H) 08/23/2021 1041   GLUCOSE 109 07/09/2017 1312   BUN 27 (H) 08/23/2021 1041   BUN 16.4 07/09/2017 1312   CREATININE 0.71 08/23/2021 1041   CREATININE 0.66 01/25/2021 1149   CREATININE 0.8 07/09/2017 1312   CALCIUM 9.3 08/23/2021 1041   CALCIUM 9.2 07/09/2017 1312   PROT 6.6 08/23/2021 1041   PROT 7.1 07/09/2017 1312   ALBUMIN 3.8 08/23/2021 1041    ALBUMIN 3.1 (L) 07/09/2017 1312   AST 15 08/23/2021 1041   AST 21 01/25/2021 1149   AST 35 (H) 07/09/2017 1312   ALT 14 08/23/2021 1041   ALT 20 01/25/2021 1149   ALT 43 07/09/2017 1312   ALKPHOS 102 08/23/2021 1041   ALKPHOS 182 (H) 07/09/2017 1312   BILITOT 0.5 08/23/2021 1041   BILITOT 0.6 01/25/2021 1149   BILITOT 0.34 07/09/2017 1312   GFRNONAA >60 08/23/2021 1041   GFRNONAA >60 01/25/2021 1149   GFRAA >60 03/29/2020 1105   GFRAA >60 01/04/2019 1351    No results found for: SPEP, UPEP  Lab Results  Component Value Date   WBC 6.4 08/23/2021   NEUTROABS 4.1 08/23/2021   HGB 11.2 (L) 08/23/2021   HCT 33.8 (L) 08/23/2021   MCV 78.6 (L) 08/23/2021   PLT 194 08/23/2021      Chemistry      Component Value Date/Time   NA 139 08/23/2021 1041   NA 141 07/09/2017 1312   K 3.7 08/23/2021 1041   K 3.9 07/09/2017 1312   CL 107 08/23/2021 1041   CO2 27 08/23/2021 1041   CO2 27 07/09/2017 1312   BUN 27 (H) 08/23/2021 1041   BUN 16.4 07/09/2017 1312   CREATININE 0.71 08/23/2021 1041   CREATININE 0.66 01/25/2021 1149   CREATININE 0.8 07/09/2017 1312      Component Value Date/Time   CALCIUM 9.3 08/23/2021 1041   CALCIUM 9.2 07/09/2017 1312   ALKPHOS 102 08/23/2021 1041   ALKPHOS 182 (H) 07/09/2017 1312   AST 15 08/23/2021 1041   AST 21 01/25/2021 1149   AST 35 (H) 07/09/2017 1312   ALT 14 08/23/2021 1041   ALT 20 01/25/2021 1149   ALT 43 07/09/2017 1312   BILITOT 0.5 08/23/2021 1041   BILITOT 0.6 01/25/2021 1149   BILITOT 0.34 07/09/2017 1312

## 2021-08-23 NOTE — Patient Instructions (Signed)
[facility] MEDICAL [facility]COLOG[facility]  Discharge Instructions: ?Thank you for choosing [facility] to provide your oncology and hematology care.  ? ?If you have a lab appointment with the Cancer Center, please go directly to the Cancer Center and check in at the registration area. ?  ?Wear comfortable clothing and clothing appropriate for easy access to any Portacath or PICC line.  ? ?We strive to give you quality time with your provider. You may need to reschedule your appointment if you arrive late (15 or more minutes).  Arriving late affects you and other patients whose appointments are after yours.  Also, if you miss three or more appointments without notifying the office, you may be dismissed from the clinic at the provider?s discretion.    ?  ?For prescription refill requests, have your pharmacy contact our office and allow 72 hours for refills to be completed.   ? ?Today you received the following chemotherapy and/or immunotherapy agents: Keytruda ?  ?To help prevent nausea and vomiting after your treatment, we encourage you to take your nausea medication as directed. ? ?BELOW ARE SYMPTOMS THAT SHOULD BE REPORTED IMMEDIATELY: ?*FEVER GREATER THAN 100.4 F (38 ?C) OR HIGHER ?*CHILLS OR SWEATING ?*NAUSEA AND VOMITING THAT IS NOT CONTROLLED WITH YOUR NAUSEA MEDICATION ?*UNUSUAL SHORTNESS OF BREATH ?*UNUSUAL BRUISING OR BLEEDING ?*URINARY PROBLEMS (pain or burning when urinating, or frequent urination) ?*BOWEL PROBLEMS (unusual diarrhea, constipation, pain near the anus) ?TENDERNESS IN MOUTH AND THROAT WITH OR WITHOUT PRESENCE OF ULCERS (sore throat, sores in mouth, or a toothache) ?UNUSUAL RASH, SWELLING OR PAIN  ?UNUSUAL VAGINAL DISCHARGE OR ITCHING  ? ?Items with * indicate a potential emergency and should be followed up as soon as possible or go to the Emergency Department if any problems should occur. ? ?Please show the CHEMOTHERAPY ALERT CARD or IMMUNOTHERAPY ALERT CARD at check-in to the  Emergency Department and triage nurse. ? ?Should you have questions after your visit or need to cancel or reschedule your appointment, please contact [facility]  Dept: [phone]  and follow the prompts.  Office hours are 8:00 a.m. to 4:30 p.m. Monday - Friday. Please note that voicemails left after 4:00 p.m. may not be returned until the following business day.  We are closed weekends and major holidays. You have access to a nurse at all times for urgent questions. Please call the main number to the clinic Dept: [phone] and follow the prompts. ? ? ?For any non-urgent questions, you may also contact your provider using MyChart. We now offer e-Visits for anyone 18 and older to request care online for non-urgent symptoms. For details visit [email or web address]. ?  ?Also download the MyChart app! Go to the app store, search "MyChart", open the app, select [facility], and log in with your MyChart username and password. ? ?Due to Covid, a mask is required upon entering the hospital/clinic. If you do not have a mask, one will be given to you upon arrival. For doctor visits, patients may have 1 support person aged 18 or older with them. For treatment visits, patients cannot have anyone with them due to current Covid guidelines and our immunocompromised population.  ? ?

## 2021-08-23 NOTE — Assessment & Plan Note (Signed)
She has mild intermittent anemia chronic illness Observed for now 

## 2021-08-23 NOTE — Assessment & Plan Note (Signed)
TSH is suppressed I will adjust the dose of her Synthroid

## 2021-09-11 ENCOUNTER — Other Ambulatory Visit: Payer: Self-pay | Admitting: Hematology and Oncology

## 2021-09-13 ENCOUNTER — Other Ambulatory Visit: Payer: Self-pay | Admitting: Hematology and Oncology

## 2021-09-13 ENCOUNTER — Other Ambulatory Visit: Payer: Self-pay

## 2021-09-13 ENCOUNTER — Inpatient Hospital Stay: Payer: Medicare Other | Attending: Hematology and Oncology

## 2021-09-13 ENCOUNTER — Inpatient Hospital Stay: Payer: Medicare Other

## 2021-09-13 VITALS — BP 126/91 | HR 73 | Temp 97.8°F | Resp 18 | Wt 212.2 lb

## 2021-09-13 DIAGNOSIS — E039 Hypothyroidism, unspecified: Secondary | ICD-10-CM | POA: Insufficient documentation

## 2021-09-13 DIAGNOSIS — D649 Anemia, unspecified: Secondary | ICD-10-CM | POA: Diagnosis not present

## 2021-09-13 DIAGNOSIS — C787 Secondary malignant neoplasm of liver and intrahepatic bile duct: Secondary | ICD-10-CM | POA: Insufficient documentation

## 2021-09-13 DIAGNOSIS — Z79899 Other long term (current) drug therapy: Secondary | ICD-10-CM | POA: Insufficient documentation

## 2021-09-13 DIAGNOSIS — Z5112 Encounter for antineoplastic immunotherapy: Secondary | ICD-10-CM | POA: Insufficient documentation

## 2021-09-13 DIAGNOSIS — C541 Malignant neoplasm of endometrium: Secondary | ICD-10-CM | POA: Diagnosis present

## 2021-09-13 LAB — CBC WITH DIFFERENTIAL (CANCER CENTER ONLY)
Abs Immature Granulocytes: 0.01 10*3/uL (ref 0.00–0.07)
Basophils Absolute: 0 10*3/uL (ref 0.0–0.1)
Basophils Relative: 1 %
Eosinophils Absolute: 0.2 10*3/uL (ref 0.0–0.5)
Eosinophils Relative: 4 %
HCT: 33.3 % — ABNORMAL LOW (ref 36.0–46.0)
Hemoglobin: 11.4 g/dL — ABNORMAL LOW (ref 12.0–15.0)
Immature Granulocytes: 0 %
Lymphocytes Relative: 25 %
Lymphs Abs: 1.4 10*3/uL (ref 0.7–4.0)
MCH: 26.6 pg (ref 26.0–34.0)
MCHC: 34.2 g/dL (ref 30.0–36.0)
MCV: 77.6 fL — ABNORMAL LOW (ref 80.0–100.0)
Monocytes Absolute: 0.5 10*3/uL (ref 0.1–1.0)
Monocytes Relative: 9 %
Neutro Abs: 3.4 10*3/uL (ref 1.7–7.7)
Neutrophils Relative %: 61 %
Platelet Count: 184 10*3/uL (ref 150–400)
RBC: 4.29 MIL/uL (ref 3.87–5.11)
RDW: 14.2 % (ref 11.5–15.5)
WBC Count: 5.6 10*3/uL (ref 4.0–10.5)
nRBC: 0 % (ref 0.0–0.2)

## 2021-09-13 LAB — COMPREHENSIVE METABOLIC PANEL
ALT: 13 U/L (ref 0–44)
AST: 14 U/L — ABNORMAL LOW (ref 15–41)
Albumin: 3.7 g/dL (ref 3.5–5.0)
Alkaline Phosphatase: 108 U/L (ref 38–126)
Anion gap: 4 — ABNORMAL LOW (ref 5–15)
BUN: 22 mg/dL (ref 8–23)
CO2: 28 mmol/L (ref 22–32)
Calcium: 9.4 mg/dL (ref 8.9–10.3)
Chloride: 103 mmol/L (ref 98–111)
Creatinine, Ser: 0.64 mg/dL (ref 0.44–1.00)
GFR, Estimated: >60 mL/min (ref >60)
Glucose, Bld: 108 mg/dL — ABNORMAL HIGH (ref 70–99)
Potassium: 3.7 mmol/L (ref 3.5–5.1)
Sodium: 135 mmol/L (ref 135–145)
Total Bilirubin: 0.6 mg/dL (ref 0.3–1.2)
Total Protein: 6.5 g/dL (ref 6.5–8.1)

## 2021-09-13 LAB — TSH: TSH: 0.243 u[IU]/mL — ABNORMAL LOW (ref 0.308–3.960)

## 2021-09-13 LAB — T4, FREE: Free T4: 1.38 ng/dL — ABNORMAL HIGH (ref 0.61–1.12)

## 2021-09-13 MED ORDER — SODIUM CHLORIDE 0.9 % IV SOLN: Freq: Once | INTRAVENOUS | Status: AC

## 2021-09-13 MED ORDER — SODIUM CHLORIDE 0.9 % IV SOLN
200.0000 mg | Freq: Once | INTRAVENOUS | Status: AC
  Administered 2021-09-13: 14:00:00 200 mg via INTRAVENOUS
  Filled 2021-09-13: qty 200

## 2021-09-13 MED ORDER — LEVOTHYROXINE SODIUM 175 MCG PO TABS: 175.0000 ug | ORAL_TABLET | Freq: Every day | ORAL | 2 refills | Status: DC

## 2021-09-13 MED ORDER — SODIUM CHLORIDE 0.9% FLUSH
10.0000 mL | Freq: Once | INTRAVENOUS | Status: AC
  Administered 2021-09-13: 12:00:00 10 mL

## 2021-09-13 MED ORDER — SODIUM CHLORIDE 0.9% FLUSH: 10.0000 mL | INTRAVENOUS | Status: DC | PRN

## 2021-09-13 NOTE — Patient Instructions (Signed)
[facility]COL[facility]  Discharge Instructions: Thank you for choosing [facility] to provide your oncology and hematology care.   If you have a lab appointment with the Cancer Center, please go directly to the Cancer Center and check in at the registration area.   Wear comfortable clothing and clothing appropriate for easy access to any Portacath or PICC line.   We strive to give you quality time with your provider. You may need to reschedule your appointment if you arrive late (15 or more minutes).  Arriving late affects you and other patients whose appointments are after yours.  Also, if you miss three or more appointments without notifying the office, you may be dismissed from the clinic at the provider's discretion.      For prescription refill requests, have your pharmacy contact our office and allow 72 hours for refills to be completed.    Today you received the following chemotherapy and/or immunotherapy agents: keytruda      To help prevent nausea and vomiting after your treatment, we encourage you to take your nausea medication as directed.  BELOW ARE SYMPTOMS THAT SHOULD BE REPORTED IMMEDIATELY: *FEVER GREATER THAN 100.4 F (38 C) OR HIGHER *CHILLS OR SWEATING *NAUSEA AND VOMITING THAT IS NOT CONTROLLED WITH YOUR NAUSEA MEDICATION *UNUSUAL SHORTNESS OF BREATH *UNUSUAL BRUISING OR BLEEDING *URINARY PROBLEMS (pain or burning when urinating, or frequent urination) *BOWEL PROBLEMS (unusual diarrhea, constipation, pain near the anus) TENDERNESS IN MOUTH AND THROAT WITH OR WITHOUT PRESENCE OF ULCERS (sore throat, sores in mouth, or a toothache) UNUSUAL RASH, SWELLING OR PAIN  UNUSUAL VAGINAL DISCHARGE OR ITCHING   Items with * indicate a potential emergency and should be followed up as soon as possible or go to the Emergency Department if any problems should occur.  Please show the CHEMOTHERAPY ALERT CARD or IMMUNOTHERAPY ALERT CARD at check-in to  the Emergency Department and triage nurse.  Should you have questions after your visit or need to cancel or reschedule your appointment, please contact [facility]  Dept: [phone]  and follow the prompts.  Office hours are 8:00 a.m. to 4:30 p.m. Monday - Friday. Please note that voicemails left after 4:00 p.m. may not be returned until the following business day.  We are closed weekends and major holidays. You have access to a nurse at all times for urgent questions. Please call the main number to the clinic Dept: [phone] and follow the prompts.   For any non-urgent questions, you may also contact your provider using MyChart. We now offer e-Visits for anyone 18 and older to request care online for non-urgent symptoms. For details visit [email or web address].   Also download the MyChart app! Go to the app store, search "MyChart", open the app, select [facility], and log in with your MyChart username and password.  Due to Covid, a mask is required upon entering the hospital/clinic. If you do not have a mask, one will be given to you upon arrival. For doctor visits, patients may have 1 support person aged 18 or older with them. For treatment visits, patients cannot have anyone with them due to current Covid guidelines and our immunocompromised population.   

## 2021-10-04 ENCOUNTER — Inpatient Hospital Stay: Payer: Medicare Other

## 2021-10-04 ENCOUNTER — Encounter: Payer: Self-pay | Admitting: Hematology and Oncology

## 2021-10-04 ENCOUNTER — Other Ambulatory Visit: Payer: Self-pay

## 2021-10-04 ENCOUNTER — Inpatient Hospital Stay (HOSPITAL_BASED_OUTPATIENT_CLINIC_OR_DEPARTMENT_OTHER): Payer: Medicare Other | Admitting: Hematology and Oncology

## 2021-10-04 DIAGNOSIS — C541 Malignant neoplasm of endometrium: Secondary | ICD-10-CM | POA: Diagnosis not present

## 2021-10-04 DIAGNOSIS — D638 Anemia in other chronic diseases classified elsewhere: Secondary | ICD-10-CM | POA: Diagnosis not present

## 2021-10-04 DIAGNOSIS — E039 Hypothyroidism, unspecified: Secondary | ICD-10-CM

## 2021-10-04 DIAGNOSIS — C787 Secondary malignant neoplasm of liver and intrahepatic bile duct: Secondary | ICD-10-CM

## 2021-10-04 DIAGNOSIS — Z5112 Encounter for antineoplastic immunotherapy: Secondary | ICD-10-CM | POA: Diagnosis not present

## 2021-10-04 LAB — COMPREHENSIVE METABOLIC PANEL
ALT: 12 U/L (ref 0–44)
AST: 15 U/L (ref 15–41)
Albumin: 3.9 g/dL (ref 3.5–5.0)
Alkaline Phosphatase: 105 U/L (ref 38–126)
Anion gap: 6 (ref 5–15)
BUN: 20 mg/dL (ref 8–23)
CO2: 27 mmol/L (ref 22–32)
Calcium: 9.8 mg/dL (ref 8.9–10.3)
Chloride: 104 mmol/L (ref 98–111)
Creatinine, Ser: 0.63 mg/dL (ref 0.44–1.00)
GFR, Estimated: >60 mL/min (ref >60)
Glucose, Bld: 93 mg/dL (ref 70–99)
Potassium: 3.8 mmol/L (ref 3.5–5.1)
Sodium: 137 mmol/L (ref 135–145)
Total Bilirubin: 0.7 mg/dL (ref 0.3–1.2)
Total Protein: 7 g/dL (ref 6.5–8.1)

## 2021-10-04 LAB — CBC WITH DIFFERENTIAL/PLATELET
Abs Immature Granulocytes: 0.01 10*3/uL (ref 0.00–0.07)
Basophils Absolute: 0.1 10*3/uL (ref 0.0–0.1)
Basophils Relative: 1 %
Eosinophils Absolute: 0.2 10*3/uL (ref 0.0–0.5)
Eosinophils Relative: 3 %
HCT: 35.6 % — ABNORMAL LOW (ref 36.0–46.0)
Hemoglobin: 11.8 g/dL — ABNORMAL LOW (ref 12.0–15.0)
Immature Granulocytes: 0 %
Lymphocytes Relative: 24 %
Lymphs Abs: 1.6 10*3/uL (ref 0.7–4.0)
MCH: 25.8 pg — ABNORMAL LOW (ref 26.0–34.0)
MCHC: 33.1 g/dL (ref 30.0–36.0)
MCV: 77.9 fL — ABNORMAL LOW (ref 80.0–100.0)
Monocytes Absolute: 0.5 10*3/uL (ref 0.1–1.0)
Monocytes Relative: 8 %
Neutro Abs: 4.3 10*3/uL (ref 1.7–7.7)
Neutrophils Relative %: 64 %
Platelets: 190 10*3/uL (ref 150–400)
RBC: 4.57 MIL/uL (ref 3.87–5.11)
RDW: 14.3 % (ref 11.5–15.5)
WBC: 6.5 10*3/uL (ref 4.0–10.5)
nRBC: 0 % (ref 0.0–0.2)

## 2021-10-04 LAB — T4, FREE: Free T4: 2.24 ng/dL — ABNORMAL HIGH (ref 0.61–1.12)

## 2021-10-04 LAB — TSH: TSH: 0.184 u[IU]/mL — ABNORMAL LOW (ref 0.308–3.960)

## 2021-10-04 MED ORDER — SODIUM CHLORIDE 0.9% FLUSH
10.0000 mL | INTRAVENOUS | Status: DC | PRN
  Administered 2021-10-04: 10 mL

## 2021-10-04 MED ORDER — SODIUM CHLORIDE 0.9 % IV SOLN
200.0000 mg | Freq: Once | INTRAVENOUS | Status: AC
  Administered 2021-10-04: 200 mg via INTRAVENOUS
  Filled 2021-10-04: qty 200

## 2021-10-04 MED ORDER — LEVOTHYROXINE SODIUM 150 MCG PO TABS: 150.0000 ug | ORAL_TABLET | Freq: Every day | ORAL | 1 refills | Status: DC

## 2021-10-04 MED ORDER — HEPARIN SOD (PORK) LOCK FLUSH 100 UNIT/ML IV SOLN
500.0000 [IU] | Freq: Once | INTRAVENOUS | Status: AC | PRN
  Administered 2021-10-04: 500 [IU]

## 2021-10-04 MED ORDER — SODIUM CHLORIDE 0.9% FLUSH
10.0000 mL | Freq: Once | INTRAVENOUS | Status: AC
  Administered 2021-10-04: 10 mL

## 2021-10-04 MED ORDER — SODIUM CHLORIDE 0.9 % IV SOLN: Freq: Once | INTRAVENOUS | Status: AC

## 2021-10-04 NOTE — Assessment & Plan Note (Signed)
She is doing very well without major side effects of treatment apart from changes in her thyroid function ?Due to stability of her imaging study, I plan to switch the interval of imaging study to 9 months, due next June 2023 ?

## 2021-10-04 NOTE — Patient Instructions (Signed)
[facility]  Discharge Instructions: ?Thank you for choosing [facility] to provide your oncology and hematology care.  ? ?If you have a lab appointment with the Cancer Center, please go directly to the Cancer Center and check in at the registration area. ?  ?Wear comfortable clothing and clothing appropriate for easy access to any Portacath or PICC line.  ? ?We strive to give you quality time with your provider. You may need to reschedule your appointment if you arrive late (15 or more minutes).  Arriving late affects you and other patients whose appointments are after yours.  Also, if you miss three or more appointments without notifying the office, you may be dismissed from the clinic at the provider?s discretion.    ?  ?For prescription refill requests, have your pharmacy contact our office and allow 72 hours for refills to be completed.   ? ?Today you received the following chemotherapy and/or immunotherapy agents: Keytruda ?  ?To help prevent nausea and vomiting after your treatment, we encourage you to take your nausea medication as directed. ? ?BELOW ARE SYMPTOMS THAT SHOULD BE REPORTED IMMEDIATELY: ?*FEVER GREATER THAN 100.4 F (38 ?C) OR HIGHER ?*CHILLS OR SWEATING ?*NAUSEA AND VOMITING THAT IS NOT CONTROLLED WITH YOUR NAUSEA MEDICATION ?*UNUSUAL SHORTNESS OF BREATH ?*UNUSUAL BRUISING OR BLEEDING ?*URINARY PROBLEMS (pain or burning when urinating, or frequent urination) ?*BOWEL PROBLEMS (unusual diarrhea, constipation, pain near the anus) ?TENDERNESS IN MOUTH AND THROAT WITH OR WITHOUT PRESENCE OF ULCERS (sore throat, sores in mouth, or a toothache) ?UNUSUAL RASH, SWELLING OR PAIN  ?UNUSUAL VAGINAL DISCHARGE OR ITCHING  ? ?Items with * indicate a potential emergency and should be followed up as soon as possible or go to the Emergency Department if any problems should occur. ? ?Please show the CHEMOTHERAPY ALERT CARD or IMMUNOTHERAPY ALERT CARD at check-in to the  Emergency Department and triage nurse. ? ?Should you have questions after your visit or need to cancel or reschedule your appointment, please contact [facility]  Dept: [phone]  and follow the prompts.  Office hours are 8:00 a.m. to 4:30 p.m. Monday - Friday. Please note that voicemails left after 4:00 p.m. may not be returned until the following business day.  We are closed weekends and major holidays. You have access to a nurse at all times for urgent questions. Please call the main number to the clinic Dept: [phone] and follow the prompts. ? ? ?For any non-urgent questions, you may also contact your provider using MyChart. We now offer e-Visits for anyone 18 and older to request care online for non-urgent symptoms. For details visit [email or web address]. ?  ?Also download the MyChart app! Go to the app store, search "MyChart", open the app, select [place], and log in with your MyChart username and password. ? ?Due to Covid, a mask is required upon entering the hospital/clinic. If you do not have a mask, one will be given to you upon arrival. For doctor visits, patients may have 1 support person aged 18 or older with them. For treatment visits, patients cannot have anyone with them due to current Covid guidelines and our immunocompromised population.  ? ?

## 2021-10-04 NOTE — Assessment & Plan Note (Signed)
She has mild intermittent anemia chronic illness Observed for now 

## 2021-10-04 NOTE — Progress Notes (Signed)
[facility] ?OFFICE PROGRESS NOTE ? ?Patient Care Team: ?[name], MD as PCP - General (Family Medicine) ? 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 ?Acquired hypothyroidism ?Since her recent weight loss, she is needing less Synthroid ?I reduce the dose of Synthroid further based on suppressed TSH ? ?Anemia, chronic disease ?She has mild intermittent anemia chronic illness ?Observed for now ? ?No orders of the defined types were placed in this encounter. ? ? ?All questions were answered. The patient knows to call the clinic with any problems, questions or concerns. ?The total time spent in the appointment was 20 minutes encounter with patients including review of chart and various tests results, discussions about plan of care and coordination of care plan ?  ?Heath Lark, MD ?10/04/2021 1:31 PM ? ?INTERVAL HISTORY: ?Please see below for problem oriented charting. ?she returns for treatment follow-up on single agent pembrolizumab for recurrent uterine cancer ?She is doing very well ?She have no side effects from recent treatment ?She started to exercise on a regular basis and is aggressive with dietary modification ?She has lost some weight ?Denies abdominal pain, nausea or changes in bowel habits ? ?REVIEW OF SYSTEMS:   ?Constitutional: Denies fevers, chills or abnormal weight loss ?Eyes: Denies blurriness of vision ?Ears, nose, mouth, throat, and face: Denies mucositis or sore throat ?Respiratory: Denies cough, dyspnea or wheezes ?Cardiovascular: Denies palpitation, chest discomfort or lower extremity swelling ?Gastrointestinal:  Denies nausea, heartburn or change in bowel habits ?Skin: Denies abnormal skin rashes ?Lymphatics: Denies new lymphadenopathy  or easy bruising ?Neurological:Denies numbness, tingling or new weaknesses ?Behavioral/Psych: Mood is stable, no new changes  ?All other systems were reviewed with the patient and are negative. ? ?I have reviewed the past medical history, past surgical history, social history and family history with the patient and they are unchanged from previous note. ? ?ALLERGIES:  is allergic to latex and lisinopril. ? ?MEDICATIONS:  ?Current Outpatient Medications  ?Medication Sig Dispense Refill  ? acetaminophen (TYLENOL) 325 MG tablet Take 650 mg by mouth every 6 (six) hours as needed for mild pain or moderate pain.     ? amoxicillin (AMOXIL) 500 MG capsule Take 2,000 mg by mouth See admin instructions. Prior to dental appointment, takes 4 tables prior to appt    ? Biotin w/ Vitamins C & E (HAIR/SKIN/NAILS PO) Take 1 tablet by mouth daily.    ? Calcium Carb-Cholecalciferol (CALCIUM 600 + D PO) Take 2 tablets by mouth daily.    ? diclofenac (VOLTAREN) 75 MG EC tablet Take 75 mg by mouth 2 (two) times daily as needed.    ? fluticasone (FLONASE) 50 MCG/ACT nasal spray Place 1 spray into both nostrils 2 (two) times daily as needed for allergies.   1  ? gabapentin (NEURONTIN) 600 MG tablet TAKE 1 TABLET BY MOUTH TWICE A DAY FOR RESTLESS LEGS AND NEUROPATHY. 60 tablet 11  ? levothyroxine (SYNTHROID) 150 MCG tablet Take 1 tablet (150 mcg total) by mouth daily before breakfast. 30 tablet 1  ? lidocaine-prilocaine (EMLA) cream APPLY TO AFFECTED AREA ONCE AS DIRECTED 30 g 3  ? loratadine (CLARITIN) 10 MG tablet Take 10 mg by mouth daily.    ? LORazepam (ATIVAN) 1 MG tablet Take  1 tablet (1 mg total) by mouth every 8 (eight) hours as needed for anxiety or sleep. 90 tablet 0  ? losartan-hydrochlorothiazide (HYZAAR) 100-12.5 MG tablet Take 0.5 tablets by mouth daily. 60 tablet 9  ? magnesium oxide (MAG-OX) 400 MG tablet Take 1 tablet by mouth 2 (two) times daily.    ? meclizine (ANTIVERT) 25 MG tablet Take 25 mg by mouth 3 (three) times  daily. Pt has vertigo  ?Take for 7 days    ? Melatonin 10 MG CAPS Take 10 mg by mouth at bedtime.    ? niacinamide 500 MG tablet Take 500 mg by mouth 3 (three) times daily with meals.    ? ondansetron (ZOFRAN) 8 MG tablet Take 1 tablet (8 mg total) by mouth every 8 (eight) hours as needed for nausea. 60 tablet 1  ? pramipexole (MIRAPEX) 0.5 MG tablet Take 1 mg by mouth at bedtime.    ? ?No current facility-administered medications for this visit.  ? ?Facility-Administered Medications Ordered in Other Visits  ?Medication Dose Route Frequency Provider Last Rate Last Admin  ? sodium chloride flush (NS) 0.9 % injection 10 mL  10 mL Intracatheter PRN Alvy Bimler, Dominic Mahaney, MD   10 mL at 10/04/21 1309  ? ? ?SUMMARY OF ONCOLOGIC HISTORY: ?Oncology History Overview Note  ?MSI stable on June 2017 tissue but high from [facility] study from November 2017 ? ?05/15/16: ER is moderately positive (70%). PR is strongly positive (80%). ?  ?Recurrent carcinoma of endometrium [facility])  ?01/02/2016 Pathology Results  ? 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 Lymph node, sentinel, biopsy, right obturator ?ONE BENIGN LYMPH NODE (0/1) ?3. Lymph nodes, regional resection, left pelvic ?FOUR BENIGN LYMPH NODES (0/4) ?  ?01/02/2016 Surgery  ? Dr. Denman George performed robotic-assisted laparoscopic total hysterectomy with bilateral salpingoophorectomy, sentinel lymph node biopsy, lymphadenectomy  ?  ?  ?05/15/2016 Pathology Results  ? Vagina, biopsy, mid ?- ADENOCARCINOMA, SEE COMMENT. ?Microscopic Comment ?The morphology along with the patient's history are consistent with recurrent endometrioid adenocarcinoma. The ?carcinoma has a similar appearance to the primary ([id number]). ?  ?05/20/2016  Imaging  ? Ct scan abdomen showed solid 2.5 cm peritoneal mass in the mid to anterior left pelvis, suspicious for peritoneal metastasis. 2. Small expansile low-attenuation filling defect in the left external iliac vein, cannot exclude a small deep venous thrombus. Consider correlation with left lower extremity venous Doppler scan. 3. Small simple fluid density structure in the left pelvic sidewall abutting the left external iliac vessels, favor a small postoperative seroma. 4. No ascites. 5. No lymphadenopathy.  No metastatic disease in the chest. 6. Aortic atherosclerosis. ?  ?06/12/2016 PET scan  ? Intensely hypermetabolic 2.1 cm central pelvic peritoneal mass just to the left of midline, consistent with peritoneal metastatic recurrence. No ascites. 2. No additional hypermetabolic sites of metastatic disease. 3. Diffuse thyroid hypermetabolism without discrete thyroid nodule, favoring thyroiditis. Recommend correlation with serum thyroid ?function tests. ?  ?06/24/2016 - 07/22/2016 Chemotherapy  ? The patient had weekly cisplatin. She has missed several doses due to infection and pancytopenia ?  ?06/24/2016 - 09/04/2016 Radiation Therapy  ? She completed concurrent radiation therapy ?Radiation treatment dates:   IMRT : 06/24/16 - 08/01/16 ?HDR : 08/13/16, 08/20/16, 08/27/16, 09/04/16 ?  ?Site/dose:   Pelvis treated to 55 Gy in  25 fractions (simultaneous integrated boost technique) ?Vaginal Cuff treated to 24 Gy in 4 fractions ?  ?08/15/2016 - 12/03/2016 Chemotherapy  ? She received 6 cycles of carboplatin/Taxol ?  ?09/27/2016 Imaging  ? Interval decrease in size of previously described solid peritoneal nodule within the left anterior pelvis. Near complete resolution of previously described low-attenuation structure along the left pelvic sidewall. No evidence for metastatic disease in the chest. Aortic atherosclerosis. ?  ?12/30/2016 Imaging  ? Ct abdomen ?1. Solitary left pelvic peritoneal implant is mildly decreased in size in  the interval. ?2. No new or progressive metastatic disease in the abdomen or pelvis. No ascites. ?3. Aortic atherosclerosis. ?  ?04/02/2017 Imaging  ? Left lower quadrant peritoneal implant referenced o

## 2021-10-04 NOTE — Assessment & Plan Note (Signed)
Since her recent weight loss, she is needing less Synthroid ?I reduce the dose of Synthroid further based on suppressed TSH ?

## 2021-10-25 ENCOUNTER — Inpatient Hospital Stay: Payer: Medicare Other | Attending: Hematology and Oncology

## 2021-10-25 ENCOUNTER — Inpatient Hospital Stay: Payer: Medicare Other

## 2021-10-25 ENCOUNTER — Other Ambulatory Visit: Payer: Self-pay

## 2021-10-25 VITALS — BP 155/81 | HR 63 | Temp 98.8°F

## 2021-10-25 DIAGNOSIS — D649 Anemia, unspecified: Secondary | ICD-10-CM | POA: Diagnosis not present

## 2021-10-25 DIAGNOSIS — C541 Malignant neoplasm of endometrium: Secondary | ICD-10-CM

## 2021-10-25 DIAGNOSIS — Z923 Personal history of irradiation: Secondary | ICD-10-CM | POA: Diagnosis not present

## 2021-10-25 DIAGNOSIS — Z79899 Other long term (current) drug therapy: Secondary | ICD-10-CM | POA: Insufficient documentation

## 2021-10-25 DIAGNOSIS — C787 Secondary malignant neoplasm of liver and intrahepatic bile duct: Secondary | ICD-10-CM | POA: Insufficient documentation

## 2021-10-25 DIAGNOSIS — Z5112 Encounter for antineoplastic immunotherapy: Secondary | ICD-10-CM | POA: Insufficient documentation

## 2021-10-25 DIAGNOSIS — E039 Hypothyroidism, unspecified: Secondary | ICD-10-CM | POA: Diagnosis not present

## 2021-10-25 LAB — CBC WITH DIFFERENTIAL/PLATELET
Abs Immature Granulocytes: 0.02 10*3/uL (ref 0.00–0.07)
Basophils Absolute: 0.1 10*3/uL (ref 0.0–0.1)
Basophils Relative: 1 %
Eosinophils Absolute: 0.3 10*3/uL (ref 0.0–0.5)
Eosinophils Relative: 3 %
HCT: 35 % — ABNORMAL LOW (ref 36.0–46.0)
Hemoglobin: 11.6 g/dL — ABNORMAL LOW (ref 12.0–15.0)
Immature Granulocytes: 0 %
Lymphocytes Relative: 24 %
Lymphs Abs: 1.8 10*3/uL (ref 0.7–4.0)
MCH: 25.8 pg — ABNORMAL LOW (ref 26.0–34.0)
MCHC: 33.1 g/dL (ref 30.0–36.0)
MCV: 78 fL — ABNORMAL LOW (ref 80.0–100.0)
Monocytes Absolute: 0.5 10*3/uL (ref 0.1–1.0)
Monocytes Relative: 7 %
Neutro Abs: 4.8 10*3/uL (ref 1.7–7.7)
Neutrophils Relative %: 65 %
Platelets: ADEQUATE 10*3/uL (ref 150–400)
RBC: 4.49 MIL/uL (ref 3.87–5.11)
RDW: 14.3 % (ref 11.5–15.5)
Smear Review: ADEQUATE
WBC: 7.5 10*3/uL (ref 4.0–10.5)
nRBC: 0 % (ref 0.0–0.2)

## 2021-10-25 LAB — COMPREHENSIVE METABOLIC PANEL
ALT: 14 U/L (ref 0–44)
AST: 16 U/L (ref 15–41)
Albumin: 3.7 g/dL (ref 3.5–5.0)
Alkaline Phosphatase: 120 U/L (ref 38–126)
Anion gap: 7 (ref 5–15)
BUN: 22 mg/dL (ref 8–23)
CO2: 28 mmol/L (ref 22–32)
Calcium: 9.4 mg/dL (ref 8.9–10.3)
Chloride: 102 mmol/L (ref 98–111)
Creatinine, Ser: 0.72 mg/dL (ref 0.44–1.00)
GFR, Estimated: >60 mL/min (ref >60)
Glucose, Bld: 95 mg/dL (ref 70–99)
Potassium: 4.1 mmol/L (ref 3.5–5.1)
Sodium: 137 mmol/L (ref 135–145)
Total Bilirubin: 0.6 mg/dL (ref 0.3–1.2)
Total Protein: 7 g/dL (ref 6.5–8.1)

## 2021-10-25 LAB — TSH: TSH: 0.804 u[IU]/mL (ref 0.350–4.500)

## 2021-10-25 LAB — T4, FREE: Free T4: 1.1 ng/dL (ref 0.61–1.12)

## 2021-10-25 MED ORDER — HEPARIN SOD (PORK) LOCK FLUSH 100 UNIT/ML IV SOLN
500.0000 [IU] | Freq: Once | INTRAVENOUS | Status: AC | PRN
  Administered 2021-10-25: 500 [IU]

## 2021-10-25 MED ORDER — SODIUM CHLORIDE 0.9 % IV SOLN: Freq: Once | INTRAVENOUS | Status: AC

## 2021-10-25 MED ORDER — SODIUM CHLORIDE 0.9 % IV SOLN
200.0000 mg | Freq: Once | INTRAVENOUS | Status: AC
  Administered 2021-10-25: 200 mg via INTRAVENOUS
  Filled 2021-10-25: qty 200

## 2021-10-25 MED ORDER — SODIUM CHLORIDE 0.9% FLUSH
10.0000 mL | INTRAVENOUS | Status: DC | PRN
  Administered 2021-10-25: 10 mL

## 2021-10-25 MED ORDER — SODIUM CHLORIDE 0.9% FLUSH
10.0000 mL | Freq: Once | INTRAVENOUS | Status: AC
  Administered 2021-10-25: 10 mL

## 2021-10-25 NOTE — Patient Instructions (Signed)
[facility]  Discharge Instructions: ?Thank you for choosing [facility] to provide your oncology and hematology care.  ? ?If you have a lab appointment with the Cancer Center, please go directly to the Cancer Center and check in at the registration area. ?  ?Wear comfortable clothing and clothing appropriate for easy access to any Portacath or PICC line.  ? ?We strive to give you quality time with your provider. You may need to reschedule your appointment if you arrive late (15 or more minutes).  Arriving late affects you and other patients whose appointments are after yours.  Also, if you miss three or more appointments without notifying the office, you may be dismissed from the clinic at the provider?s discretion.    ?  ?For prescription refill requests, have your pharmacy contact our office and allow 72 hours for refills to be completed.   ? ?Today you received the following chemotherapy and/or immunotherapy agents: Keytruda ?  ?To help prevent nausea and vomiting after your treatment, we encourage you to take your nausea medication as directed. ? ?BELOW ARE SYMPTOMS THAT SHOULD BE REPORTED IMMEDIATELY: ?*FEVER GREATER THAN 100.4 F (38 ?C) OR HIGHER ?*CHILLS OR SWEATING ?*NAUSEA AND VOMITING THAT IS NOT CONTROLLED WITH YOUR NAUSEA MEDICATION ?*UNUSUAL SHORTNESS OF BREATH ?*UNUSUAL BRUISING OR BLEEDING ?*URINARY PROBLEMS (pain or burning when urinating, or frequent urination) ?*BOWEL PROBLEMS (unusual diarrhea, constipation, pain near the anus) ?TENDERNESS IN MOUTH AND THROAT WITH OR WITHOUT PRESENCE OF ULCERS (sore throat, sores in mouth, or a toothache) ?UNUSUAL RASH, SWELLING OR PAIN  ?UNUSUAL VAGINAL DISCHARGE OR ITCHING  ? ?Items with * indicate a potential emergency and should be followed up as soon as possible or go to the Emergency Department if any problems should occur. ? ?Please show the CHEMOTHERAPY ALERT CARD or IMMUNOTHERAPY ALERT CARD at check-in to the  Emergency Department and triage nurse. ? ?Should you have questions after your visit or need to cancel or reschedule your appointment, please contact [facility]  Dept: [phone]  and follow the prompts.  Office hours are 8:00 a.m. to 4:30 p.m. Monday - Friday. Please note that voicemails left after 4:00 p.m. may not be returned until the following business day.  We are closed weekends and major holidays. You have access to a nurse at all times for urgent questions. Please call the main number to the clinic Dept: [phone] and follow the prompts. ? ? ?For any non-urgent questions, you may also contact your provider using MyChart. We now offer e-Visits for anyone 18 and older to request care online for non-urgent symptoms. For details visit [email or web address]. ?  ?Also download the MyChart app! Go to the app store, search "MyChart", open the app, select [place], and log in with your MyChart username and password. ? ?Due to Covid, a mask is required upon entering the hospital/clinic. If you do not have a mask, one will be given to you upon arrival. For doctor visits, patients may have 1 support person aged 18 or older with them. For treatment visits, patients cannot have anyone with them due to current Covid guidelines and our immunocompromised population.  ? ?

## 2021-10-25 NOTE — Progress Notes (Signed)
Ok to proceed without plts per Dr Alvy Bimler.   ?

## 2021-10-26 ENCOUNTER — Other Ambulatory Visit: Payer: Self-pay | Admitting: Hematology and Oncology

## 2021-11-15 ENCOUNTER — Inpatient Hospital Stay: Payer: Medicare Other

## 2021-11-15 ENCOUNTER — Encounter: Payer: Self-pay | Admitting: Hematology and Oncology

## 2021-11-15 ENCOUNTER — Other Ambulatory Visit: Payer: Self-pay

## 2021-11-15 ENCOUNTER — Inpatient Hospital Stay: Payer: Medicare Other | Attending: Hematology and Oncology | Admitting: Hematology and Oncology

## 2021-11-15 VITALS — BP 130/66 | HR 73 | Temp 98.5°F | Resp 18 | Ht 71.0 in | Wt 205.6 lb

## 2021-11-15 DIAGNOSIS — C787 Secondary malignant neoplasm of liver and intrahepatic bile duct: Secondary | ICD-10-CM | POA: Diagnosis present

## 2021-11-15 DIAGNOSIS — E039 Hypothyroidism, unspecified: Secondary | ICD-10-CM

## 2021-11-15 DIAGNOSIS — Z79890 Hormone replacement therapy (postmenopausal): Secondary | ICD-10-CM | POA: Diagnosis not present

## 2021-11-15 DIAGNOSIS — Z5112 Encounter for antineoplastic immunotherapy: Secondary | ICD-10-CM | POA: Diagnosis present

## 2021-11-15 DIAGNOSIS — R42 Dizziness and giddiness: Secondary | ICD-10-CM | POA: Insufficient documentation

## 2021-11-15 DIAGNOSIS — C541 Malignant neoplasm of endometrium: Secondary | ICD-10-CM

## 2021-11-15 DIAGNOSIS — Z79899 Other long term (current) drug therapy: Secondary | ICD-10-CM | POA: Diagnosis not present

## 2021-11-15 LAB — CBC WITH DIFFERENTIAL/PLATELET
Abs Immature Granulocytes: 0.01 10*3/uL (ref 0.00–0.07)
Basophils Absolute: 0.1 10*3/uL (ref 0.0–0.1)
Basophils Relative: 1 %
Eosinophils Absolute: 0.3 10*3/uL (ref 0.0–0.5)
Eosinophils Relative: 4 %
HCT: 36.4 % (ref 36.0–46.0)
Hemoglobin: 12.3 g/dL (ref 12.0–15.0)
Immature Granulocytes: 0 %
Lymphocytes Relative: 23 %
Lymphs Abs: 1.6 10*3/uL (ref 0.7–4.0)
MCH: 26.5 pg (ref 26.0–34.0)
MCHC: 33.8 g/dL (ref 30.0–36.0)
MCV: 78.3 fL — ABNORMAL LOW (ref 80.0–100.0)
Monocytes Absolute: 0.5 10*3/uL (ref 0.1–1.0)
Monocytes Relative: 7 %
Neutro Abs: 4.5 10*3/uL (ref 1.7–7.7)
Neutrophils Relative %: 65 %
Platelets: 216 10*3/uL (ref 150–400)
RBC: 4.65 MIL/uL (ref 3.87–5.11)
RDW: 14.5 % (ref 11.5–15.5)
WBC: 6.9 10*3/uL (ref 4.0–10.5)
nRBC: 0 % (ref 0.0–0.2)

## 2021-11-15 LAB — TSH: TSH: 2.591 u[IU]/mL (ref 0.350–4.500)

## 2021-11-15 LAB — COMPREHENSIVE METABOLIC PANEL
ALT: 16 U/L (ref 0–44)
AST: 19 U/L (ref 15–41)
Albumin: 4 g/dL (ref 3.5–5.0)
Alkaline Phosphatase: 124 U/L (ref 38–126)
Anion gap: 7 (ref 5–15)
BUN: 24 mg/dL — ABNORMAL HIGH (ref 8–23)
CO2: 26 mmol/L (ref 22–32)
Calcium: 9.6 mg/dL (ref 8.9–10.3)
Chloride: 105 mmol/L (ref 98–111)
Creatinine, Ser: 0.73 mg/dL (ref 0.44–1.00)
GFR, Estimated: >60 mL/min (ref >60)
Glucose, Bld: 108 mg/dL — ABNORMAL HIGH (ref 70–99)
Potassium: 3.8 mmol/L (ref 3.5–5.1)
Sodium: 138 mmol/L (ref 135–145)
Total Bilirubin: 0.7 mg/dL (ref 0.3–1.2)
Total Protein: 7.5 g/dL (ref 6.5–8.1)

## 2021-11-15 LAB — T4, FREE: Free T4: 1.12 ng/dL (ref 0.61–1.12)

## 2021-11-15 MED ORDER — SODIUM CHLORIDE 0.9% FLUSH
10.0000 mL | INTRAVENOUS | Status: DC | PRN
  Administered 2021-11-15: 10 mL

## 2021-11-15 MED ORDER — HEPARIN SOD (PORK) LOCK FLUSH 100 UNIT/ML IV SOLN
500.0000 [IU] | Freq: Once | INTRAVENOUS | Status: AC | PRN
  Administered 2021-11-15: 500 [IU]

## 2021-11-15 MED ORDER — SODIUM CHLORIDE 0.9% FLUSH
10.0000 mL | Freq: Once | INTRAVENOUS | Status: AC
  Administered 2021-11-15: 10 mL

## 2021-11-15 MED ORDER — SODIUM CHLORIDE 0.9 % IV SOLN: Freq: Once | INTRAVENOUS | Status: AC

## 2021-11-15 MED ORDER — ALTEPLASE 2 MG IJ SOLR
2.0000 mg | Freq: Once | INTRAMUSCULAR | Status: AC
  Administered 2021-11-15: 2 mg
  Filled 2021-11-15: qty 2

## 2021-11-15 MED ORDER — SODIUM CHLORIDE 0.9 % IV SOLN
200.0000 mg | Freq: Once | INTRAVENOUS | Status: AC
  Administered 2021-11-15: 200 mg via INTRAVENOUS
  Filled 2021-11-15: qty 200

## 2021-11-15 NOTE — Patient Instructions (Signed)
[facility]  Discharge Instructions: ?Thank you for choosing [facility] to provide your oncology and hematology care.  ? ?If you have a lab appointment with the Cancer Center, please go directly to the Cancer Center and check in at the registration area. ?  ?Wear comfortable clothing and clothing appropriate for easy access to any Portacath or PICC line.  ? ?We strive to give you quality time with your provider. You may need to reschedule your appointment if you arrive late (15 or more minutes).  Arriving late affects you and other patients whose appointments are after yours.  Also, if you miss three or more appointments without notifying the office, you may be dismissed from the clinic at the provider?s discretion.    ?  ?For prescription refill requests, have your pharmacy contact our office and allow 72 hours for refills to be completed.   ? ?Today you received the following chemotherapy and/or immunotherapy agents: Keytruda ?  ?To help prevent nausea and vomiting after your treatment, we encourage you to take your nausea medication as directed. ? ?BELOW ARE SYMPTOMS THAT SHOULD BE REPORTED IMMEDIATELY: ?*FEVER GREATER THAN 100.4 F (38 ?C) OR HIGHER ?*CHILLS OR SWEATING ?*NAUSEA AND VOMITING THAT IS NOT CONTROLLED WITH YOUR NAUSEA MEDICATION ?*UNUSUAL SHORTNESS OF BREATH ?*UNUSUAL BRUISING OR BLEEDING ?*URINARY PROBLEMS (pain or burning when urinating, or frequent urination) ?*BOWEL PROBLEMS (unusual diarrhea, constipation, pain near the anus) ?TENDERNESS IN MOUTH AND THROAT WITH OR WITHOUT PRESENCE OF ULCERS (sore throat, sores in mouth, or a toothache) ?UNUSUAL RASH, SWELLING OR PAIN  ?UNUSUAL VAGINAL DISCHARGE OR ITCHING  ? ?Items with * indicate a potential emergency and should be followed up as soon as possible or go to the Emergency Department if any problems should occur. ? ?Please show the CHEMOTHERAPY ALERT CARD or IMMUNOTHERAPY ALERT CARD at check-in to the  Emergency Department and triage nurse. ? ?Should you have questions after your visit or need to cancel or reschedule your appointment, please contact [facility]  Dept: [phone]  and follow the prompts.  Office hours are 8:00 a.m. to 4:30 p.m. Monday - Friday. Please note that voicemails left after 4:00 p.m. may not be returned until the following business day.  We are closed weekends and major holidays. You have access to a nurse at all times for urgent questions. Please call the main number to the clinic Dept: [phone] and follow the prompts. ? ? ?For any non-urgent questions, you may also contact your provider using MyChart. We now offer e-Visits for anyone 18 and older to request care online for non-urgent symptoms. For details visit [email or web address]. ?  ?Also download the MyChart app! Go to the app store, search "MyChart", open the app, select [facility], and log in with your MyChart username and password. ? ?Due to Covid, a mask is required upon entering the hospital/clinic. If you do not have a mask, one will be given to you upon arrival. For doctor visits, patients may have 1 support Briton Sellman aged 18 or older with them. For treatment visits, patients cannot have anyone with them due to current Covid guidelines and our immunocompromised population.  ? ?

## 2021-11-16 ENCOUNTER — Encounter: Payer: Self-pay | Admitting: Hematology and Oncology

## 2021-11-16 DIAGNOSIS — R42 Dizziness and giddiness: Secondary | ICD-10-CM | POA: Insufficient documentation

## 2021-11-16 NOTE — Progress Notes (Signed)
[facility] ?OFFICE PROGRESS NOTE ? ?Patient Care Team: ?[name], MD as PCP - General (Family Medicine) ? 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 ?Acquired hypothyroidism ?Since her recent weight loss, she is needing less Synthroid ?I will continue to adjust her Synthroid dose accordingly ? ?Vertigo ?She has occasional vertigo ?I suspect there is a component of dehydration ?We discussed importance of adequate hydration ? ?Orders Placed This Encounter  ?Procedures  ? CT ABDOMEN PELVIS W CONTRAST  ?  Standing Status:   Future  ?  Standing Expiration Date:   11/16/2022  ?  Order Specific Question:   If indicated for the ordered procedure, I authorize the administration of contrast media per Radiology protocol  ?  Answer:   Yes  ?  Order Specific Question:   Preferred imaging location?  ?  Answer:   [facility]  ?  Order Specific Question:   Radiology Contrast Protocol - do NOT remove file path  ?  Answer:   [email or web address]  ? ? ?All questions were answered. The patient knows to call the clinic with any problems, questions or concerns. ?The total time spent in the appointment was 25 minutes encounter with patients including review of chart and various tests results, discussions about plan of care and coordination of care plan ?  ?Heath Lark, MD ?11/16/2021 10:19 AM ? ?INTERVAL HISTORY: ?Please see below for problem oriented charting. ?she returns for treatment follow-up on maintenance pembrolizumab ?She is doing well except for occasional vertigo ?She is still attempting to lose weight to her best ability ?No abdominal pain or changes in bowel habits ? ?REVIEW OF SYSTEMS:   ?Constitutional: Denies fevers, chills or abnormal weight loss ?Eyes: Denies blurriness of vision ?Ears, nose, mouth,  throat, and face: Denies mucositis or sore throat ?Respiratory: Denies cough, dyspnea or wheezes ?Cardiovascular: Denies palpitation, chest discomfort or lower extremity swelling ?Gastrointestinal:  Denies nausea, heartburn or change in bowel habits ?Skin: Denies abnormal skin rashes ?Lymphatics: Denies new lymphadenopathy or easy bruising ?Neurological:Denies numbness, tingling or new weaknesses ?Behavioral/Psych: Mood is stable, no new changes  ?All other systems were reviewed with the patient and are negative. ? ?I have reviewed the past medical history, past surgical history, social history and family history with the patient and they are unchanged from previous note. ? ?ALLERGIES:  is allergic to latex and lisinopril. ? ?MEDICATIONS:  ?Current Outpatient Medications  ?Medication Sig Dispense Refill  ? acetaminophen (TYLENOL) 325 MG tablet Take 650 mg by mouth every 6 (six) hours as needed for mild pain or moderate pain.     ? amoxicillin (AMOXIL) 500 MG capsule Take 2,000 mg by mouth See admin instructions. Prior to dental appointment, takes 4 tables prior to appt    ? Biotin w/ Vitamins C & E (HAIR/SKIN/NAILS PO) Take 1 tablet by mouth daily.    ? Calcium Carb-Cholecalciferol (CALCIUM 600 + D PO) Take 2 tablets by mouth daily.    ? diclofenac (VOLTAREN) 75 MG EC tablet Take 75 mg by mouth 2 (two) times daily as needed.    ? fluticasone (FLONASE) 50 MCG/ACT nasal spray Place 1 spray into both nostrils 2 (two) times daily as needed for allergies.   1  ? gabapentin (NEURONTIN) 600 MG tablet TAKE 1 TABLET BY MOUTH TWICE A DAY FOR RESTLESS LEGS AND NEUROPATHY. 60 tablet  11  ? levothyroxine (SYNTHROID) 150 MCG tablet TAKE 1 TABLET BY MOUTH DAILY BEFORE BREAKFAST. 30 tablet 1  ? lidocaine-prilocaine (EMLA) cream APPLY TO AFFECTED AREA ONCE AS DIRECTED 30 g 3  ? loratadine (CLARITIN) 10 MG tablet Take 10 mg by mouth daily.    ? LORazepam (ATIVAN) 1 MG tablet Take 1 tablet (1 mg total) by mouth every 8 (eight) hours as  needed for anxiety or sleep. 90 tablet 0  ? losartan-hydrochlorothiazide (HYZAAR) 100-12.5 MG tablet Take 0.5 tablets by mouth daily. 60 tablet 9  ? magnesium oxide (MAG-OX) 400 MG tablet Take 1 tablet by mouth 2 (two) times daily.    ? meclizine (ANTIVERT) 25 MG tablet Take 25 mg by mouth 3 (three) times daily. Pt has vertigo  ?Take for 7 days    ? Melatonin 10 MG CAPS Take 10 mg by mouth at bedtime.    ? niacinamide 500 MG tablet Take 500 mg by mouth 3 (three) times daily with meals.    ? ondansetron (ZOFRAN) 8 MG tablet Take 1 tablet (8 mg total) by mouth every 8 (eight) hours as needed for nausea. 60 tablet 1  ? pramipexole (MIRAPEX) 0.5 MG tablet Take 1 mg by mouth at bedtime.    ? ?No current facility-administered medications for this visit.  ? ? ?SUMMARY OF ONCOLOGIC HISTORY: ?Oncology History Overview Note  ?MSI stable on June 2017 tissue but high from [facility] study from November 2017 ? ?05/15/16: ER is moderately positive (70%). PR is strongly positive (80%). ?  ?Recurrent carcinoma of endometrium [facility])  ?01/02/2016 Pathology Results  ? 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 Lymph node, sentinel, biopsy, right obturator ?ONE BENIGN LYMPH NODE (0/1) ?3. Lymph nodes, regional resection, left pelvic ?FOUR BENIGN LYMPH NODES (0/4) ? ?  ?01/02/2016 Surgery  ? Dr. Denman George performed robotic-assisted laparoscopic total hysterectomy with bilateral salpingoophorectomy, sentinel lymph node biopsy, lymphadenectomy  ?  ? ?  ?05/15/2016 Pathology Results  ? Vagina, biopsy, mid ?- ADENOCARCINOMA, SEE COMMENT. ?Microscopic Comment ?The morphology along with the patient's history are consistent with recurrent endometrioid adenocarcinoma. The ?carcinoma has  a similar appearance to the primary ([id number]). ? ?  ?05/20/2016 Imaging  ? Ct scan abdomen showed solid 2.5 cm peritoneal mass in the mid to anterior left pelvis, suspicious for peritoneal metastasis. 2. Small expansile low-attenuation filling defect in the left external iliac vein, cannot exclude a small deep venous thrombus. Consider correlation with left lower extremity venous Doppler scan. 3. Small simple fluid density structure in the left pelvic sidewall abutting the left external iliac vessels, favor a small postoperative seroma. 4. No ascites. 5. No lymphadenopathy.  No metastatic disease in the chest. 6. Aortic atherosclerosis. ? ?  ?06/12/2016 PET scan  ? Intensely hypermetabolic 2.1 cm central pelvic peritoneal mass just to the left of midline, consistent with peritoneal metastatic recurrence. No ascites. 2. No additional hypermetabolic sites of metastatic disease. 3. Diffuse thyroid hypermetabolism without discrete thyroid nodule, favoring thyroiditis. Recommend correlation with serum thyroid ?function tests. ? ?  ?06/24/2016 - 07/22/2016 Chemotherapy  ? The patient had weekly cisplatin. She has missed several doses due to infection and pancytopenia ? ?  ?06/24/2016 - 09/04/2016 Radiation Therapy  ? She completed concurrent radiation therapy ?Radiation treatment dates:   IMRT : 06/24/16 -  08/01/16 ?HDR : 08/13/16, 08/20/16, 08/27/16, 09/04/16 ?  ?Site/dose:   Pelvis treated to 55 Gy in 25 fractions (simultaneous integrated boost technique) ?Vaginal Cuff treated to 24 Gy in 4 fractions ? ?  ?08/15/2016 - 12/03/2016 Chemotherapy  ? She received 6 cycles of carboplatin/Taxol ? ?  ?09/27/2016 Imaging  ? Interval decrease in size of previously described solid peritoneal nodule within the left anterior pelvis. Near complete resolution of previously described low-attenuation structure along the left pelvic sidewall. No evidence for metastatic disease in the chest. Aortic atherosclerosis. ? ?  ?12/30/2016 Imaging  ? Ct  abdomen ?1. Solitary left pelvic peritoneal implant is mildly decreased in size in the interval. ?2. No new or progressive metastatic disease in the abdomen or pelvis. No ascites. ?3. Aortic atherosclero

## 2021-11-16 NOTE — Assessment & Plan Note (Signed)
She has occasional vertigo ?I suspect there is a component of dehydration ?We discussed importance of adequate hydration ?

## 2021-11-16 NOTE — Assessment & Plan Note (Signed)
Since her recent weight loss, she is needing less Synthroid ?I will continue to adjust her Synthroid dose accordingly ?

## 2021-11-16 NOTE — Assessment & Plan Note (Signed)
Clinically, she have no signs or symptoms to suggest disease progression ?I recommend repeat CT imaging next month for further follow-up ?

## 2021-11-21 ENCOUNTER — Other Ambulatory Visit: Payer: Self-pay | Admitting: Hematology and Oncology

## 2021-11-22 ENCOUNTER — Encounter: Payer: Self-pay | Admitting: Hematology and Oncology

## 2021-12-05 ENCOUNTER — Telehealth: Payer: Self-pay

## 2021-12-05 ENCOUNTER — Other Ambulatory Visit: Payer: Self-pay | Admitting: Hematology and Oncology

## 2021-12-05 NOTE — Telephone Encounter (Signed)
Returned her call. She is wanting to clarify Synthroid dose. Told her per last note she should take Synthroid 150 mcg. She verbalized understanding.

## 2021-12-06 ENCOUNTER — Inpatient Hospital Stay: Payer: Medicare Other

## 2021-12-06 ENCOUNTER — Other Ambulatory Visit: Payer: Self-pay

## 2021-12-06 ENCOUNTER — Inpatient Hospital Stay: Payer: Medicare Other | Attending: Hematology and Oncology

## 2021-12-06 ENCOUNTER — Telehealth: Payer: Self-pay

## 2021-12-06 ENCOUNTER — Encounter: Payer: Self-pay | Admitting: Hematology and Oncology

## 2021-12-06 VITALS — BP 155/82 | HR 64 | Temp 97.9°F | Resp 16 | Wt 209.8 lb

## 2021-12-06 DIAGNOSIS — C541 Malignant neoplasm of endometrium: Secondary | ICD-10-CM | POA: Diagnosis present

## 2021-12-06 DIAGNOSIS — Z5112 Encounter for antineoplastic immunotherapy: Secondary | ICD-10-CM | POA: Insufficient documentation

## 2021-12-06 DIAGNOSIS — C787 Secondary malignant neoplasm of liver and intrahepatic bile duct: Secondary | ICD-10-CM | POA: Diagnosis present

## 2021-12-06 DIAGNOSIS — E039 Hypothyroidism, unspecified: Secondary | ICD-10-CM

## 2021-12-06 DIAGNOSIS — Z79899 Other long term (current) drug therapy: Secondary | ICD-10-CM | POA: Diagnosis not present

## 2021-12-06 DIAGNOSIS — Z923 Personal history of irradiation: Secondary | ICD-10-CM | POA: Insufficient documentation

## 2021-12-06 LAB — T4, FREE: Free T4: 1.21 ng/dL — ABNORMAL HIGH (ref 0.61–1.12)

## 2021-12-06 LAB — COMPREHENSIVE METABOLIC PANEL
ALT: 13 U/L (ref 0–44)
AST: 16 U/L (ref 15–41)
Albumin: 3.8 g/dL (ref 3.5–5.0)
Alkaline Phosphatase: 120 U/L (ref 38–126)
Anion gap: 5 (ref 5–15)
BUN: 21 mg/dL (ref 8–23)
CO2: 29 mmol/L (ref 22–32)
Calcium: 9.6 mg/dL (ref 8.9–10.3)
Chloride: 104 mmol/L (ref 98–111)
Creatinine, Ser: 0.72 mg/dL (ref 0.44–1.00)
GFR, Estimated: >60 mL/min (ref >60)
Glucose, Bld: 111 mg/dL — ABNORMAL HIGH (ref 70–99)
Potassium: 3.8 mmol/L (ref 3.5–5.1)
Sodium: 138 mmol/L (ref 135–145)
Total Bilirubin: 0.7 mg/dL (ref 0.3–1.2)
Total Protein: 6.9 g/dL (ref 6.5–8.1)

## 2021-12-06 LAB — CBC WITH DIFFERENTIAL/PLATELET
Abs Immature Granulocytes: 0.02 10*3/uL (ref 0.00–0.07)
Basophils Absolute: 0.1 10*3/uL (ref 0.0–0.1)
Basophils Relative: 1 %
Eosinophils Absolute: 0.3 10*3/uL (ref 0.0–0.5)
Eosinophils Relative: 4 %
HCT: 34.1 % — ABNORMAL LOW (ref 36.0–46.0)
Hemoglobin: 11.3 g/dL — ABNORMAL LOW (ref 12.0–15.0)
Immature Granulocytes: 0 %
Lymphocytes Relative: 26 %
Lymphs Abs: 1.7 10*3/uL (ref 0.7–4.0)
MCH: 26.3 pg (ref 26.0–34.0)
MCHC: 33.1 g/dL (ref 30.0–36.0)
MCV: 79.3 fL — ABNORMAL LOW (ref 80.0–100.0)
Monocytes Absolute: 0.4 10*3/uL (ref 0.1–1.0)
Monocytes Relative: 7 %
Neutro Abs: 4 10*3/uL (ref 1.7–7.7)
Neutrophils Relative %: 62 %
Platelets: 219 10*3/uL (ref 150–400)
RBC: 4.3 MIL/uL (ref 3.87–5.11)
RDW: 14.2 % (ref 11.5–15.5)
WBC: 6.5 10*3/uL (ref 4.0–10.5)
nRBC: 0 % (ref 0.0–0.2)

## 2021-12-06 LAB — TSH: TSH: 2.36 u[IU]/mL (ref 0.350–4.500)

## 2021-12-06 MED ORDER — SODIUM CHLORIDE 0.9% FLUSH
10.0000 mL | INTRAVENOUS | Status: DC | PRN
  Administered 2021-12-06: 10 mL

## 2021-12-06 MED ORDER — SODIUM CHLORIDE 0.9 % IV SOLN
200.0000 mg | Freq: Once | INTRAVENOUS | Status: AC
  Administered 2021-12-06: 200 mg via INTRAVENOUS
  Filled 2021-12-06: qty 200

## 2021-12-06 MED ORDER — SODIUM CHLORIDE 0.9% FLUSH
10.0000 mL | Freq: Once | INTRAVENOUS | Status: AC
  Administered 2021-12-06: 10 mL

## 2021-12-06 MED ORDER — SODIUM CHLORIDE 0.9 % IV SOLN: Freq: Once | INTRAVENOUS | Status: AC

## 2021-12-06 MED ORDER — HEPARIN SOD (PORK) LOCK FLUSH 100 UNIT/ML IV SOLN
500.0000 [IU] | Freq: Once | INTRAVENOUS | Status: AC | PRN
  Administered 2021-12-06: 500 [IU]

## 2021-12-06 NOTE — Patient Instructions (Addendum)
[facility]  Discharge Instructions: Thank you for choosing [facility] to provide your oncology and hematology care.   If you have a lab appointment with the Cancer Center, please go directly to the Cancer Center and check in at the registration area.   Wear comfortable clothing and clothing appropriate for easy access to any Portacath or PICC line.   We strive to give you quality time with your provider. You may need to reschedule your appointment if you arrive late (15 or more minutes).  Arriving late affects you and other patients whose appointments are after yours.  Also, if you miss three or more appointments without notifying the office, you may be dismissed from the clinic at the provider's discretion.      For prescription refill requests, have your pharmacy contact our office and allow 72 hours for refills to be completed.    Today you received the following chemotherapy and/or immunotherapy agents: Keytruda      To help prevent nausea and vomiting after your treatment, we encourage you to take your nausea medication as directed.  BELOW ARE SYMPTOMS THAT SHOULD BE REPORTED IMMEDIATELY: *FEVER GREATER THAN 100.4 F (38 C) OR HIGHER *CHILLS OR SWEATING *NAUSEA AND VOMITING THAT IS NOT CONTROLLED WITH YOUR NAUSEA MEDICATION *UNUSUAL SHORTNESS OF BREATH *UNUSUAL BRUISING OR BLEEDING *URINARY PROBLEMS (pain or burning when urinating, or frequent urination) *BOWEL PROBLEMS (unusual diarrhea, constipation, pain near the anus) TENDERNESS IN MOUTH AND THROAT WITH OR WITHOUT PRESENCE OF ULCERS (sore throat, sores in mouth, or a toothache) UNUSUAL RASH, SWELLING OR PAIN  UNUSUAL VAGINAL DISCHARGE OR ITCHING   Items with * indicate a potential emergency and should be followed up as soon as possible or go to the Emergency Department if any problems should occur.  Please show the CHEMOTHERAPY ALERT CARD or IMMUNOTHERAPY ALERT CARD at check-in to  the Emergency Department and triage nurse.  Should you have questions after your visit or need to cancel or reschedule your appointment, please contact [facility]  Dept: [phone]  and follow the prompts.  Office hours are 8:00 a.m. to 4:30 p.m. Monday - Friday. Please note that voicemails left after 4:00 p.m. may not be returned until the following business day.  We are closed weekends and major holidays. You have access to a nurse at all times for urgent questions. Please call the main number to the clinic Dept: [phone] and follow the prompts.   For any non-urgent questions, you may also contact your provider using MyChart. We now offer e-Visits for anyone 18 and older to request care online for non-urgent symptoms. For details visit [email or web address].   Also download the MyChart app! Go to the app store, search "MyChart", open the app, select [place], and log in with your MyChart username and password.  Due to Covid, a mask is required upon entering the hospital/clinic. If you do not have a mask, one will be given to you upon arrival. For doctor visits, patients may have 1 support person aged 18 or older with them. For treatment visits, patients cannot have anyone with them due to current Covid guidelines and our immunocompromised population.  Pembrolizumab injection What is this medication? PEMBROLIZUMAB (pem broe liz ue mab) is a monoclonal antibody. It is used to treat certain types of cancer. This medicine may be used for other purposes; ask your health care provider or pharmacist if you have questions. COMMON BRAND NAME(S): Keytruda What should   should I tell my care team before I take this medication? They need to know if you have any of these conditions: autoimmune diseases like Crohn's disease, ulcerative colitis, or lupus have had or planning to have an allogeneic stem cell transplant (uses someone else's stem cells) history of organ  transplant history of chest radiation nervous system problems like myasthenia gravis or Guillain-Barre syndrome an unusual or allergic reaction to pembrolizumab, other medicines, foods, dyes, or preservatives pregnant or trying to get pregnant breast-feeding How should I use this medication? This medicine is for infusion into a vein. It is given by a health care professional in a hospital or clinic setting. A special MedGuide will be given to you before each treatment. Be sure to read this information carefully each time. Talk to your pediatrician regarding the use of this medicine in children. While this drug may be prescribed for children as young as 6 months for selected conditions, precautions do apply. Overdosage: If you think you have taken too much of this medicine contact a poison control center or emergency room at once. NOTE: This medicine is only for you. Do not share this medicine with others. What if I miss a dose? It is important not to miss your dose. Call your doctor or health care professional if you are unable to keep an appointment. What may interact with this medication? Interactions have not been studied. This list may not describe all possible interactions. Give your health care provider a list of all the medicines, herbs, non-prescription drugs, or dietary supplements you use. Also tell them if you smoke, drink alcohol, or use illegal drugs. Some items may interact with your medicine. What should I watch for while using this medication? Your condition will be monitored carefully while you are receiving this medicine. You may need blood work done while you are taking this medicine. Do not become pregnant while taking this medicine or for 4 months after stopping it. Women should inform their doctor if they wish to become pregnant or think they might be pregnant. There is a potential for serious side effects to an unborn child. Talk to your health care professional or  pharmacist for more information. Do not breast-feed an infant while taking this medicine or for 4 months after the last dose. What side effects may I notice from receiving this medication? Side effects that you should report to your doctor or health care professional as soon as possible: allergic reactions like skin rash, itching or hives, swelling of the face, lips, or tongue bloody or black, tarry breathing problems changes in vision chest pain chills confusion constipation cough diarrhea dizziness or feeling faint or lightheaded fast or irregular heartbeat fever flushing joint pain low blood counts - this medicine may decrease the number of white blood cells, red blood cells and platelets. You may be at increased risk for infections and bleeding. muscle pain muscle weakness pain, tingling, numbness in the hands or feet persistent headache redness, blistering, peeling or loosening of the skin, including inside the mouth signs and symptoms of high blood sugar such as dizziness; dry mouth; dry skin; fruity breath; nausea; stomach pain; increased hunger or thirst; increased urination signs and symptoms of kidney injury like trouble passing urine or change in the amount of urine signs and symptoms of liver injury like dark urine, light-colored stools, loss of appetite, nausea, right upper belly pain, yellowing of the eyes or skin sweating swollen lymph nodes weight loss Side effects that usually do not require medical  report to your doctor or health care professional if they continue or are bothersome): decreased appetite hair loss tiredness This list may not describe all possible side effects. Call your doctor for medical advice about side effects. You may report side effects to FDA at 1-800-FDA-1088. Where should I keep my medication? This drug is given in a hospital or clinic and will not be stored at home. NOTE: This sheet is a summary. It may not cover all possible  information. If you have questions about this medicine, talk to your doctor, pharmacist, or health care provider.  2023 Elsevier/Gold Standard (2021-05-25 00:00:00)  

## 2021-12-06 NOTE — Telephone Encounter (Signed)
Called and told to continue the same dose and Dr. Alvy Bimler just sent a refill.

## 2021-12-06 NOTE — Telephone Encounter (Signed)
She called and left a message asking if she should continue Synthroid 150 mcg after labs today? She did not want to pick up a refill if a change was needed.

## 2022-01-01 ENCOUNTER — Telehealth: Payer: Self-pay

## 2022-01-01 ENCOUNTER — Inpatient Hospital Stay: Payer: Medicare Other

## 2022-01-01 ENCOUNTER — Other Ambulatory Visit: Payer: Self-pay

## 2022-01-01 ENCOUNTER — Ambulatory Visit (HOSPITAL_COMMUNITY)
Admission: RE | Admit: 2022-01-01 | Discharge: 2022-01-01 | Disposition: A | Discharge status: Home / Self Care | Payer: Medicare Other | Source: Ambulatory Visit | Attending: Hematology and Oncology | Admitting: Hematology and Oncology

## 2022-01-01 ENCOUNTER — Encounter (HOSPITAL_COMMUNITY): Payer: Self-pay

## 2022-01-01 DIAGNOSIS — C541 Malignant neoplasm of endometrium: Secondary | ICD-10-CM

## 2022-01-01 DIAGNOSIS — C787 Secondary malignant neoplasm of liver and intrahepatic bile duct: Secondary | ICD-10-CM

## 2022-01-01 DIAGNOSIS — Z5112 Encounter for antineoplastic immunotherapy: Secondary | ICD-10-CM | POA: Diagnosis not present

## 2022-01-01 DIAGNOSIS — E039 Hypothyroidism, unspecified: Secondary | ICD-10-CM

## 2022-01-01 LAB — CBC WITH DIFFERENTIAL/PLATELET
Abs Immature Granulocytes: 0.01 10*3/uL (ref 0.00–0.07)
Basophils Absolute: 0.1 10*3/uL (ref 0.0–0.1)
Basophils Relative: 1 %
Eosinophils Absolute: 0.6 10*3/uL — ABNORMAL HIGH (ref 0.0–0.5)
Eosinophils Relative: 9 %
HCT: 34.2 % — ABNORMAL LOW (ref 36.0–46.0)
Hemoglobin: 11.4 g/dL — ABNORMAL LOW (ref 12.0–15.0)
Immature Granulocytes: 0 %
Lymphocytes Relative: 23 %
Lymphs Abs: 1.5 10*3/uL (ref 0.7–4.0)
MCH: 26.6 pg (ref 26.0–34.0)
MCHC: 33.3 g/dL (ref 30.0–36.0)
MCV: 79.7 fL — ABNORMAL LOW (ref 80.0–100.0)
Monocytes Absolute: 0.4 10*3/uL (ref 0.1–1.0)
Monocytes Relative: 6 %
Neutro Abs: 4 10*3/uL (ref 1.7–7.7)
Neutrophils Relative %: 61 %
Platelets: 216 10*3/uL (ref 150–400)
RBC: 4.29 MIL/uL (ref 3.87–5.11)
RDW: 14.4 % (ref 11.5–15.5)
WBC: 6.6 10*3/uL (ref 4.0–10.5)
nRBC: 0 % (ref 0.0–0.2)

## 2022-01-01 LAB — COMPREHENSIVE METABOLIC PANEL
ALT: 13 U/L (ref 0–44)
AST: 16 U/L (ref 15–41)
Albumin: 3.8 g/dL (ref 3.5–5.0)
Alkaline Phosphatase: 108 U/L (ref 38–126)
Anion gap: 4 — ABNORMAL LOW (ref 5–15)
BUN: 18 mg/dL (ref 8–23)
CO2: 30 mmol/L (ref 22–32)
Calcium: 9.5 mg/dL (ref 8.9–10.3)
Chloride: 105 mmol/L (ref 98–111)
Creatinine, Ser: 0.73 mg/dL (ref 0.44–1.00)
GFR, Estimated: >60 mL/min (ref >60)
Glucose, Bld: 100 mg/dL — ABNORMAL HIGH (ref 70–99)
Potassium: 3.6 mmol/L (ref 3.5–5.1)
Sodium: 139 mmol/L (ref 135–145)
Total Bilirubin: 0.5 mg/dL (ref 0.3–1.2)
Total Protein: 6.8 g/dL (ref 6.5–8.1)

## 2022-01-01 LAB — TSH: TSH: 9.728 u[IU]/mL — ABNORMAL HIGH (ref 0.350–4.500)

## 2022-01-01 LAB — T4, FREE: Free T4: 0.95 ng/dL (ref 0.61–1.12)

## 2022-01-01 MED ORDER — IOHEXOL 300 MG/ML  SOLN
100.0000 mL | Freq: Once | INTRAMUSCULAR | Status: AC | PRN
  Administered 2022-01-01: 100 mL via INTRAVENOUS

## 2022-01-01 MED ORDER — SODIUM CHLORIDE 0.9% FLUSH
10.0000 mL | Freq: Once | INTRAVENOUS | Status: AC
  Administered 2022-01-01: 10 mL

## 2022-01-01 MED ORDER — HEPARIN SOD (PORK) LOCK FLUSH 100 UNIT/ML IV SOLN
500.0000 [IU] | Freq: Once | INTRAVENOUS | Status: AC
  Administered 2022-01-01: 500 [IU] via INTRAVENOUS

## 2022-01-01 MED ORDER — SODIUM CHLORIDE (PF) 0.9 % IJ SOLN
INTRAMUSCULAR | Status: AC
  Filled 2022-01-01: qty 50

## 2022-01-01 MED ORDER — HEPARIN SOD (PORK) LOCK FLUSH 100 UNIT/ML IV SOLN
INTRAVENOUS | Status: AC
  Filled 2022-01-01: qty 5

## 2022-01-03 ENCOUNTER — Encounter: Payer: Self-pay | Admitting: Hematology and Oncology

## 2022-01-03 ENCOUNTER — Telehealth: Payer: Self-pay

## 2022-01-03 ENCOUNTER — Other Ambulatory Visit: Payer: Self-pay

## 2022-01-03 ENCOUNTER — Inpatient Hospital Stay: Payer: Medicare Other

## 2022-01-03 ENCOUNTER — Inpatient Hospital Stay (HOSPITAL_BASED_OUTPATIENT_CLINIC_OR_DEPARTMENT_OTHER): Payer: Medicare Other | Admitting: Hematology and Oncology

## 2022-01-03 VITALS — HR 65 | Resp 16

## 2022-01-03 DIAGNOSIS — D638 Anemia in other chronic diseases classified elsewhere: Secondary | ICD-10-CM

## 2022-01-03 DIAGNOSIS — E039 Hypothyroidism, unspecified: Secondary | ICD-10-CM

## 2022-01-03 DIAGNOSIS — E663 Overweight: Secondary | ICD-10-CM

## 2022-01-03 DIAGNOSIS — C541 Malignant neoplasm of endometrium: Secondary | ICD-10-CM | POA: Diagnosis not present

## 2022-01-03 DIAGNOSIS — Z5112 Encounter for antineoplastic immunotherapy: Secondary | ICD-10-CM | POA: Diagnosis not present

## 2022-01-03 MED ORDER — SODIUM CHLORIDE 0.9% FLUSH
10.0000 mL | INTRAVENOUS | Status: DC | PRN
  Administered 2022-01-03: 10 mL

## 2022-01-03 MED ORDER — SODIUM CHLORIDE 0.9 % IV SOLN
200.0000 mg | Freq: Once | INTRAVENOUS | Status: AC
  Administered 2022-01-03: 200 mg via INTRAVENOUS
  Filled 2022-01-03: qty 200

## 2022-01-03 MED ORDER — SODIUM CHLORIDE 0.9 % IV SOLN: Freq: Once | INTRAVENOUS | Status: AC

## 2022-01-03 MED ORDER — HEPARIN SOD (PORK) LOCK FLUSH 100 UNIT/ML IV SOLN
500.0000 [IU] | Freq: Once | INTRAVENOUS | Status: AC | PRN
  Administered 2022-01-03: 500 [IU]

## 2022-01-03 NOTE — Assessment & Plan Note (Signed)
Recent TSH is elevated I plan to increase the dose of Synthroid We will continue to adjust the dose of Synthroid as needed

## 2022-01-03 NOTE — Telephone Encounter (Signed)
Returned her call. She received the message regarding increasing Synthroid to 175 mcg, she does not need new Rx sent to pharmacy. She had a old Rx.

## 2022-01-03 NOTE — Assessment & Plan Note (Signed)
She has mild intermittent anemia chronic illness Observed for now 

## 2022-01-03 NOTE — Patient Instructions (Signed)
[facility]  Discharge Instructions: Thank you for choosing [facility] to provide your oncology and hematology care.   If you have a lab appointment with the [facility], please go directly to the [facility] and check in at the registration area.   Wear comfortable clothing and clothing appropriate for easy access to any Portacath or PICC line.   We strive to give you quality time with your provider. You may need to reschedule your appointment if you arrive late (15 or more minutes).  Arriving late affects you and other patients whose appointments are after yours.  Also, if you miss three or more appointments without notifying the office, you may be dismissed from the clinic at the provider's discretion.      For prescription refill requests, have your pharmacy contact our office and allow 72 hours for refills to be completed.    Today you received the following chemotherapy and/or immunotherapy agents: Keytruda      To help prevent nausea and vomiting after your treatment, we encourage you to take your nausea medication as directed.  BELOW ARE SYMPTOMS THAT SHOULD BE REPORTED IMMEDIATELY: *FEVER GREATER THAN 100.4 F (38 C) OR HIGHER *CHILLS OR SWEATING *NAUSEA AND VOMITING THAT IS NOT CONTROLLED WITH YOUR NAUSEA MEDICATION *UNUSUAL SHORTNESS OF BREATH *UNUSUAL BRUISING OR BLEEDING *URINARY PROBLEMS (pain or burning when urinating, or frequent urination) *BOWEL PROBLEMS (unusual diarrhea, constipation, pain near the anus) TENDERNESS IN MOUTH AND THROAT WITH OR WITHOUT PRESENCE OF ULCERS (sore throat, sores in mouth, or a toothache) UNUSUAL RASH, SWELLING OR PAIN  UNUSUAL VAGINAL DISCHARGE OR ITCHING   Items with * indicate a potential emergency and should be followed up as soon as possible or go to the Emergency Department if any problems should occur.  Please show the CHEMOTHERAPY ALERT CARD or IMMUNOTHERAPY ALERT CARD at check-in to  the Emergency Department and triage nurse.  Should you have questions after your visit or need to cancel or reschedule your appointment, please contact [facility]  Dept: [phone]  and follow the prompts.  Office hours are 8:00 a.m. to 4:30 p.m. Monday - Friday. Please note that voicemails left after 4:00 p.m. may not be returned until the following business day.  We are closed weekends and major holidays. You have access to a nurse at all times for urgent questions. Please call the main number to the clinic Dept: [phone] and follow the prompts.   For any non-urgent questions, you may also contact your provider using MyChart. We now offer e-Visits for anyone 61 and older to request care online for non-urgent symptoms. For details visit [email or web address].   Also download the MyChart app! Go to the app store, search "MyChart", open the app, select [facility], and log in with your MyChart username and password.  Due to Covid, a mask is required upon entering the hospital/clinic. If you do not have a mask, one will be given to you upon arrival. For doctor visits, patients may have 1 support person aged 54 or older with them. For treatment visits, patients cannot have anyone with them due to current Covid guidelines and our immunocompromised population.   Pembrolizumab injection What is this medication? PEMBROLIZUMAB (pem broe liz ue mab) is a monoclonal antibody. It is used to treat certain types of cancer. This medicine may be used for other purposes; ask your health care provider or pharmacist if you have questions. COMMON BRAND NAME(S): Keytruda What  should I tell my care team before I take this medication? They need to know if you have any of these conditions: autoimmune diseases like Crohn's disease, ulcerative colitis, or lupus have had or planning to have an allogeneic stem cell transplant (uses someone else's stem cells) history of organ  transplant history of chest radiation nervous system problems like myasthenia gravis or Guillain-Barre syndrome an unusual or allergic reaction to pembrolizumab, other medicines, foods, dyes, or preservatives pregnant or trying to get pregnant breast-feeding How should I use this medication? This medicine is for infusion into a vein. It is given by a health care professional in a hospital or clinic setting. A special MedGuide will be given to you before each treatment. Be sure to read this information carefully each time. Talk to your pediatrician regarding the use of this medicine in children. While this drug may be prescribed for children as young as 6 months for selected conditions, precautions do apply. Overdosage: If you think you have taken too much of this medicine contact a poison control center or emergency room at once. NOTE: This medicine is only for you. Do not share this medicine with others. What if I miss a dose? It is important not to miss your dose. Call your doctor or health care professional if you are unable to keep an appointment. What may interact with this medication? Interactions have not been studied. This list may not describe all possible interactions. Give your health care provider a list of all the medicines, herbs, non-prescription drugs, or dietary supplements you use. Also tell them if you smoke, drink alcohol, or use illegal drugs. Some items may interact with your medicine. What should I watch for while using this medication? Your condition will be monitored carefully while you are receiving this medicine. You may need blood work done while you are taking this medicine. Do not become pregnant while taking this medicine or for 4 months after stopping it. Women should inform their doctor if they wish to become pregnant or think they might be pregnant. There is a potential for serious side effects to an unborn child. Talk to your health care professional or  pharmacist for more information. Do not breast-feed an infant while taking this medicine or for 4 months after the last dose. What side effects may I notice from receiving this medication? Side effects that you should report to your doctor or health care professional as soon as possible: allergic reactions like skin rash, itching or hives, swelling of the face, lips, or tongue bloody or black, tarry breathing problems changes in vision chest pain chills confusion constipation cough diarrhea dizziness or feeling faint or lightheaded fast or irregular heartbeat fever flushing joint pain low blood counts - this medicine may decrease the number of white blood cells, red blood cells and platelets. You may be at increased risk for infections and bleeding. muscle pain muscle weakness pain, tingling, numbness in the hands or feet persistent headache redness, blistering, peeling or loosening of the skin, including inside the mouth signs and symptoms of high blood sugar such as dizziness; dry mouth; dry skin; fruity breath; nausea; stomach pain; increased hunger or thirst; increased urination signs and symptoms of kidney injury like trouble passing urine or change in the amount of urine signs and symptoms of liver injury like dark urine, light-colored stools, loss of appetite, nausea, right upper belly pain, yellowing of the eyes or skin sweating swollen lymph nodes weight loss Side effects that usually do not require medical  attention (report to your doctor or health care professional if they continue or are bothersome): decreased appetite hair loss tiredness This list may not describe all possible side effects. Call your doctor for medical advice about side effects. You may report side effects to FDA at 1-800-FDA-1088. Where should I keep my medication? This drug is given in a hospital or clinic and will not be stored at home. NOTE: This sheet is a summary. It may not cover all possible  information. If you have questions about this medicine, talk to your doctor, pharmacist, or health care provider.  2023 [employer]/Gold Standard (2021-05-25 00:00:00)

## 2022-01-03 NOTE — Progress Notes (Signed)
[place] OFFICE PROGRESS NOTE  Patient Care Team: [name], MD as PCP - General (Family Medicine)  ASSESSMENT & PLAN:  Recurrent carcinoma of endometrium [facility]) I have reviewed multiple imaging studies with the patient and her sister The patient has been on immunotherapy for 4 years with no signs of cancer progression We discussed the risk and benefits of discontinuing treatment versus continuing indefinitely For now, she would like to continue treatment indefinitely I recommend spacing out interval imaging study to once a year, due June 2024  Acquired hypothyroidism Recent TSH is elevated I plan to increase the dose of Synthroid We will continue to adjust the dose of Synthroid as needed  Anemia, chronic disease She has mild intermittent anemia chronic illness Observed for now  Overweight (BMI 25.0-29.9) We have extensive discussions about the importance of weight loss and healthy lifestyle I recommend the patient to increase physical activity as tolerated  Orders Placed This Encounter  Procedures   TSH    Standing Status:   Standing    Number of Occurrences:   22    Standing Expiration Date:   01/04/2023   T4, free    Standing Status:   Standing    Number of Occurrences:   22    Standing Expiration Date:   01/04/2023    All questions were answered. The patient knows to call the clinic with any problems, questions or concerns. The total time spent in the appointment was 30 minutes encounter with patients including review of chart and various tests results, discussions about plan of care and coordination of care plan   Heath Lark, MD 01/03/2022 11:11 AM  INTERVAL HISTORY: Please see below for problem oriented charting. she returns for treatment follow-up and review test results She is here accompanied by her sister She denies abdominal pain No recent side effects from treatment  REVIEW OF SYSTEMS:   Constitutional: Denies fevers, chills or  abnormal weight loss Eyes: Denies blurriness of vision Ears, nose, mouth, throat, and face: Denies mucositis or sore throat Respiratory: Denies cough, dyspnea or wheezes Cardiovascular: Denies palpitation, chest discomfort or lower extremity swelling Gastrointestinal:  Denies nausea, heartburn or change in bowel habits Skin: Denies abnormal skin rashes Lymphatics: Denies new lymphadenopathy or easy bruising Neurological:Denies numbness, tingling or new weaknesses Behavioral/Psych: Mood is stable, no new changes  All other systems were reviewed with the patient and are negative.  I have reviewed the past medical history, past surgical history, social history and family history with the patient and they are unchanged from previous note.  ALLERGIES:  is allergic to latex and lisinopril.  MEDICATIONS:  Current Outpatient Medications  Medication Sig Dispense Refill   acetaminophen (TYLENOL) 325 MG tablet Take 650 mg by mouth every 6 (six) hours as needed for mild pain or moderate pain.      amoxicillin (AMOXIL) 500 MG capsule Take 2,000 mg by mouth See admin instructions. Prior to dental appointment, takes 4 tables prior to appt     Biotin w/ Vitamins C & E (HAIR/SKIN/NAILS PO) Take 1 tablet by mouth daily.     Calcium Carb-Cholecalciferol (CALCIUM 600 + D PO) Take 2 tablets by mouth daily.     diclofenac (VOLTAREN) 75 MG EC tablet Take 75 mg by mouth 2 (two) times daily as needed.     fluticasone (FLONASE) 50 MCG/ACT nasal spray Place 1 spray into both nostrils 2 (two) times daily as needed for allergies.   1   gabapentin (NEURONTIN) 600  MG tablet TAKE 1 TABLET BY MOUTH TWICE A DAY FOR RESTLESS LEGS AND NEUROPATHY. 60 tablet 11   levothyroxine (SYNTHROID) 150 MCG tablet TAKE 1 TABLET BY MOUTH EVERY DAY BEFORE BREAKFAST (Patient taking differently: Take 175 mcg by mouth daily before breakfast. New Rx not needed.) 90 tablet 1   lidocaine-prilocaine (EMLA) cream APPLY TO AFFECTED AREA ONCE AS  DIRECTED 30 g 3   loratadine (CLARITIN) 10 MG tablet Take 10 mg by mouth daily.     LORazepam (ATIVAN) 1 MG tablet Take 1 tablet (1 mg total) by mouth every 8 (eight) hours as needed for anxiety or sleep. 90 tablet 0   losartan-hydrochlorothiazide (HYZAAR) 100-12.5 MG tablet Take 0.5 tablets by mouth daily. 60 tablet 9   magnesium oxide (MAG-OX) 400 MG tablet Take 1 tablet by mouth 2 (two) times daily.     meclizine (ANTIVERT) 25 MG tablet Take 25 mg by mouth 3 (three) times daily. Pt has vertigo  Take for 7 days     Melatonin 10 MG CAPS Take 10 mg by mouth at bedtime.     niacinamide 500 MG tablet Take 500 mg by mouth 3 (three) times daily with meals.     ondansetron (ZOFRAN) 8 MG tablet Take 1 tablet (8 mg total) by mouth every 8 (eight) hours as needed for nausea. 60 tablet 1   pramipexole (MIRAPEX) 0.5 MG tablet Take 1 mg by mouth at bedtime.     No current facility-administered medications for this visit.   Facility-Administered Medications Ordered in Other Visits  Medication Dose Route Frequency Provider Last Rate Last Admin   heparin lock flush 100 unit/mL  500 Units Intracatheter Once PRN Alvy Bimler, Tenzin Edelman, MD       pembrolizumab (KEYTRUDA) 200 mg in sodium chloride 0.9 % 50 mL chemo infusion  200 mg Intravenous Once Alvy Bimler, Farris Geiman, MD 116 mL/hr at 01/03/22 1051 200 mg at 01/03/22 1051   sodium chloride flush (NS) 0.9 % injection 10 mL  10 mL Intracatheter PRN Heath Lark, MD        SUMMARY OF ONCOLOGIC HISTORY: Oncology History Overview Note  MSI stable on June 2017 tissue but high from [facility] study from November 2017  05/15/16: ER is moderately positive (70%). PR is strongly positive (80%).   Recurrent carcinoma of endometrium ([place])  01/02/2016 Pathology Results   Uterus +/- tubes/ovaries, neoplastic, cervix ENDOMETRIAL ADENOCARCINOMA, FIGO GRADE 2 (4.7 CM) THE TUMOR INVADES LESS THAN ONE-HALF OF THE MYOMETRIUM (PT1A) ALL MARGINS OF RESECTION ARE NEGATIVE FOR  CARCINOMA LEIOMYOMAS AND ADENOMYOSIS BILATERAL FALLOPIAN TUBES AND OVARIES: HISTOLOGICAL UNREMARKABLE 2. Lymph node, sentinel, biopsy, right obturator ONE BENIGN LYMPH NODE (0/1) 3. Lymph nodes, regional resection, left pelvic FOUR BENIGN LYMPH NODES (0/4)   01/02/2016 Surgery   Dr. Denman George performed robotic-assisted laparoscopic total hysterectomy with bilateral salpingoophorectomy, sentinel lymph node biopsy, lymphadenectomy      05/15/2016 Pathology Results   Vagina, biopsy, mid - ADENOCARCINOMA, SEE COMMENT. Microscopic Comment The morphology along with the patient's history are consistent with recurrent endometrioid adenocarcinoma. The carcinoma has a similar appearance to the primary ([id number]).   05/20/2016 Imaging   Ct scan abdomen showed solid 2.5 cm peritoneal mass in the mid to anterior left pelvis, suspicious for peritoneal metastasis. 2. Small expansile low-attenuation filling defect in the left external iliac vein, cannot exclude a small deep venous thrombus. Consider correlation with left lower extremity venous Doppler scan. 3. Small simple fluid density structure in the left pelvic sidewall abutting the left external  iliac vessels, favor a small postoperative seroma. 4. No ascites. 5. No lymphadenopathy.  No metastatic disease in the chest. 6. Aortic atherosclerosis.   06/12/2016 PET scan   Intensely hypermetabolic 2.1 cm central pelvic peritoneal mass just to the left of midline, consistent with peritoneal metastatic recurrence. No ascites. 2. No additional hypermetabolic sites of metastatic disease. 3. Diffuse thyroid hypermetabolism without discrete thyroid nodule, favoring thyroiditis. Recommend correlation with serum thyroid function tests.   06/24/2016 - 07/22/2016 Chemotherapy   The patient had weekly cisplatin. She has missed several doses due to infection and pancytopenia   06/24/2016 - 09/04/2016 Radiation Therapy   She completed concurrent radiation  therapy Radiation treatment dates:   IMRT : 06/24/16 - 08/01/16 HDR : 08/13/16, 08/20/16, 08/27/16, 09/04/16   Site/dose:   Pelvis treated to 55 Gy in 25 fractions (simultaneous integrated boost technique) Vaginal Cuff treated to 24 Gy in 4 fractions   08/15/2016 - 12/03/2016 Chemotherapy   She received 6 cycles of carboplatin/Taxol   09/27/2016 Imaging   Interval decrease in size of previously described solid peritoneal nodule within the left anterior pelvis. Near complete resolution of previously described low-attenuation structure along the left pelvic sidewall. No evidence for metastatic disease in the chest. Aortic atherosclerosis.   12/30/2016 Imaging   Ct abdomen 1. Solitary left pelvic peritoneal implant is mildly decreased in size in the interval. 2. No new or progressive metastatic disease in the abdomen or pelvis. No ascites. 3. Aortic atherosclerosis.   04/02/2017 Imaging   Left lower quadrant peritoneal implant referenced on previous exam measures 1.7 x 1.3 cm, image 69 of series 2. Increased from 0.8 x 0.8 cm previously peer no new peritoneal implants identified.   Musculoskeletal: The degenerative disc disease noted within the lumbar spine.   IMPRESSION: 1. Solitary left pelvic peritoneal implant is increased in size in the interval. 2. No new sites of disease.  No ascites. 3. Aortic atherosclerosis   04/11/2017 PET scan   1. The left side of pelvis peritoneal implant has decreased in size and degree of FDG uptake compatible with response to therapy. No new areas of peritoneal disease identified. 2. Persistent diffuse increased uptake within the thyroid gland. Correlation with patient's thyroid function may be helpful.   05/26/2017 Imaging   1. Enlarging tumor implant along the left adnexa, currently 2.6 by 2.4 cm and previously 1.9 by 1.3 cm. No new tumor implant or other specific cause for the patient's pelvic symptoms is currently identified. 2.  Aortic Atherosclerosis  (ICD10-I70.0). 3. Lumbar spondylosis and degenerative disc disease causing multilevel impingement.   06/04/2017 - 08/28/2017 Chemotherapy   The patient started letrozole and everolimus   08/26/2017 Imaging   Increased size of mass in the left adnexal region.  Several new small liver metastases in right hepatic lobe   09/08/2017 Imaging   LV EF: 60% -   65%   09/10/2017 Procedure   Technically successful right IJ power-injectable port catheter placement. Ready for routine use   12/04/2017 Imaging   1. Interval increase in size and number of multiple lesions within the liver compatible with hepatic metastatic disease. 2. Interval increase in size mass within the left hemipelvis.   12/15/2017 -  Chemotherapy   The patient had pembrolizumab (KEYTRUDA) 200 mg in sodium chloride 0.9 % 50 mL chemo infusion, 200 mg, Intravenous, Once, 1 of 6 cycles Administration: 200 mg (12/15/2017)  for chemotherapy treatment.    12/21/2017 Imaging   1. Moderate left hydronephrosis and hydroureter, similar compared  to most recent CT from May 2019; obstruction appears to be secondary to a left pelvic mass/metastatic focus which has increased in size since the prior CT. 2. Numerous hepatic metastatic lesions, suspect that some may be increased in size but difficult to further characterize without intravenous contrast. Increased size of left pelvic soft tissue mass/metastatic lesion. Mass abuts and possibly invades the sigmoid colon.     02/20/2018 Imaging   Interval decrease in hepatic metastases.  Decreased mass or lymphadenopathy in the left external iliac chain. Interval resolution of left hydroureteronephrosis.  No new or progressive disease within the abdomen or pelvis.   05/16/2018 Imaging   05/16/2018 CT Abdomen IMPRESSION: 1. Slight interval decrease in size of hepatic metastatic disease. 2. Slight interval decrease in size centrally necrotic mass within the left external iliac region.   09/18/2018  Imaging   1. Today's study demonstrates a mixed response to therapy. Specifically, while the previously noted hepatic metastases appear decreased, the soft tissue mass along the left pelvic sidewall appears slightly larger than the prior study. No new metastatic disease is noted elsewhere in the abdomen or pelvis. 2. Aortic atherosclerosis. 3. Mild cardiomegaly. 4. Additional incidental findings, as above     03/12/2019 Imaging   1. Overall improvement with reduced size of the hepatic metastatic lesions, and reduced size of the irregular soft tissue mass in the left pelvis. 2. Other imaging findings of potential clinical significance: Mild distal esophageal wall thickening, cannot exclude esophagitis. Aortic Atherosclerosis (ICD10-I70.0). Lumbar impingement at L5-S1.     09/09/2019 Imaging   1. Stable size and appearance of the somewhat irregular left adnexal mass, currently 2.8 by 1.9 cm. 2. The scattered hepatic metastatic lesions are stable from 03/12/2019, and represent effectively treated metastatic lesions. 3. Other imaging findings of potential clinical significance: Airway thickening is present, suggesting bronchitis or reactive airways disease. Lumbar spondylosis and degenerative disc disease causing impingement at L4-5 and L5-S1. Small umbilical hernia contains adipose tissue.   Aortic Atherosclerosis (ICD10-I70.0).   03/07/2020 Imaging   1. Stable appearance of LEFT pelvic mass without new signs of disease. Mass remains closely approximated to the colon, in the vicinity of the distal LEFT ureter and the iliac vessels. 2. Stable signs of hepatic metastatic disease with calcified lesion at the dome of the liver and subtle areas of low attenuation elsewhere which show no change.   09/12/2020 Imaging   1. Stable appearance of the partially calcified LEFT pelvic sidewall soft tissue mass with unchanged involvement of the sigmoid colon, iliac vessels and close proximity to the left ureter. No  new signs of disease. 2. Stable signs of the hepatic metastatic disease with calcified lesion at the hepatic dome and subcentimeter area of low attenuation in the right hepatic lobe. 3. No evidence of new or enlarging abdominopelvic metastatic disease. 4. Aortic atherosclerosis.   03/15/2021 Imaging   1. Stable exam. No new or progressive interval findings. 2. Spiculated calcified soft tissue lesion along the left pelvic sidewall is stable in the interval. This lesion tethers the sigmoid colon and is in close proximity to the left ureter and left external iliac vasculature. 3. Stable appearance of the calcified lesion at the dome of the liver and hypoattenuating lesion in the right liver. 4. Small umbilical hernia contains only fat.   01/03/2022 Imaging   1. Residua from prior metastatic lesions in the liver and left pelvic sidewall without progression or new lesion identified. Appearance most compatible with effectively treated malignancy. 2. The left adnexal/pelvic  sidewall lesion abuts the margin of the sigmoid colon, as before. 3. Other imaging findings of potential clinical significance: Aortic Atherosclerosis (ICD10-I70.0). Mild mitral valve calcification. Lower lumbar impingement. Small umbilical hernia contains adipose tissue.     PHYSICAL EXAMINATION: ECOG PERFORMANCE STATUS: 0 - Asymptomatic  Vitals:   01/03/22 0943  BP: 104/89  Temp: (!) 97.5 F (36.4 C)  SpO2: 100%   Filed Weights   01/03/22 0943  Weight: 209 lb 3.2 oz (94.9 kg)    GENERAL:alert, no distress and comfortable NEURO: alert & oriented x 3 with fluent speech, no focal motor/sensory deficits  LABORATORY DATA:  I have reviewed the data as listed    Component Value Date/Time   NA 139 01/01/2022 1051   NA 141 07/09/2017 1312   K 3.6 01/01/2022 1051   K 3.9 07/09/2017 1312   CL 105 01/01/2022 1051   CO2 30 01/01/2022 1051   CO2 27 07/09/2017 1312   GLUCOSE 100 (H) 01/01/2022 1051   GLUCOSE 109  07/09/2017 1312   BUN 18 01/01/2022 1051   BUN 16.4 07/09/2017 1312   CREATININE 0.73 01/01/2022 1051   CREATININE 0.66 01/25/2021 1149   CREATININE 0.8 07/09/2017 1312   CALCIUM 9.5 01/01/2022 1051   CALCIUM 9.2 07/09/2017 1312   PROT 6.8 01/01/2022 1051   PROT 7.1 07/09/2017 1312   ALBUMIN 3.8 01/01/2022 1051   ALBUMIN 3.1 (L) 07/09/2017 1312   AST 16 01/01/2022 1051   AST 21 01/25/2021 1149   AST 35 (H) 07/09/2017 1312   ALT 13 01/01/2022 1051   ALT 20 01/25/2021 1149   ALT 43 07/09/2017 1312   ALKPHOS 108 01/01/2022 1051   ALKPHOS 182 (H) 07/09/2017 1312   BILITOT 0.5 01/01/2022 1051   BILITOT 0.6 01/25/2021 1149   BILITOT 0.34 07/09/2017 1312   GFRNONAA >60 01/01/2022 1051   GFRNONAA >60 01/25/2021 1149   GFRAA >60 03/29/2020 1105   GFRAA >60 01/04/2019 1351    No results found for: "SPEP", "UPEP"  Lab Results  Component Value Date   WBC 6.6 01/01/2022   NEUTROABS 4.0 01/01/2022   HGB 11.4 (L) 01/01/2022   HCT 34.2 (L) 01/01/2022   MCV 79.7 (L) 01/01/2022   PLT 216 01/01/2022      Chemistry      Component Value Date/Time   NA 139 01/01/2022 1051   NA 141 07/09/2017 1312   K 3.6 01/01/2022 1051   K 3.9 07/09/2017 1312   CL 105 01/01/2022 1051   CO2 30 01/01/2022 1051   CO2 27 07/09/2017 1312   BUN 18 01/01/2022 1051   BUN 16.4 07/09/2017 1312   CREATININE 0.73 01/01/2022 1051   CREATININE 0.66 01/25/2021 1149   CREATININE 0.8 07/09/2017 1312      Component Value Date/Time   CALCIUM 9.5 01/01/2022 1051   CALCIUM 9.2 07/09/2017 1312   ALKPHOS 108 01/01/2022 1051   ALKPHOS 182 (H) 07/09/2017 1312   AST 16 01/01/2022 1051   AST 21 01/25/2021 1149   AST 35 (H) 07/09/2017 1312   ALT 13 01/01/2022 1051   ALT 20 01/25/2021 1149   ALT 43 07/09/2017 1312   BILITOT 0.5 01/01/2022 1051   BILITOT 0.6 01/25/2021 1149   BILITOT 0.34 07/09/2017 1312       RADIOGRAPHIC STUDIES: I have reviewed multiple imaging studies with the patient I have personally  reviewed the radiological images as listed and agreed with the findings in the report. 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 Ongoing immunotherapy, prior radiation therapy and chemotherapy. * Tracking Code: BO * EXAM: CT ABDOMEN AND PELVIS WITH CONTRAST TECHNIQUE: Multidetector CT imaging of the abdomen and pelvis was performed using the standard protocol following bolus administration of intravenous contrast. RADIATION DOSE REDUCTION: This exam was performed according to the departmental dose-optimization program which includes automated exposure control, adjustment of the mA and/or kV according to patient size and/or use of iterative reconstruction technique. CONTRAST:  153m OMNIPAQUE IOHEXOL 300 MG/ML  SOLN COMPARISON:  03/14/2021 FINDINGS: Lower chest: Mild mitral valve calcification. Hepatobiliary: Tiny rim calcified residuum the prior metastatic lesion in the dome of the right hepatic lobe, currently 0.6 cm in long axis on image 13 series 2, no change from 03/14/2021. A chronically stable hypodense lesion favoring cyst or similar benign process measures 0.7 by 0.6 cm in the right hepatic lobe on image 18 series 2. A rim calcified residuum from prior metastatic lesion is present in the right hepatic lobe adjacent to the IVC on image 23 series 2, stable 0.7 by 0.6 cm. Back on 12/03/2017, there were multiple and other additional hepatic metastatic lesions, these do not demonstrate substantial residual findings on today's exam. No new or active malignant liver lesion identified. Mildly contracted gallbladder. Pancreas: Unremarkable Spleen: Unremarkable Adrenals/Urinary Tract: Unremarkable Stomach/Bowel: Unremarkable Vascular/Lymphatic: Atherosclerosis is present, including aortoiliac atherosclerotic disease. No pathologic adenopathy. Reproductive: Uterus absent. Residuum from prior metastatic lesion along the left  pelvis/adnexal region with internal punctate calcifications and irregular margins, measuring about 1.3 cm in oblique thickness on image 55 of series 4, stable from 03/14/2021. This abuts the margin of the sigmoid colon. Other: No supplemental non-categorized findings. Musculoskeletal: Multilevel lumbar spondylosis and degenerative disc disease, with resulting rim impingement bilaterally at L4-5 and L5-S1. Small umbilical hernia contains adipose tissue. IMPRESSION: 1. Residua from prior metastatic lesions in the liver and left pelvic sidewall without progression or new lesion identified. Appearance most compatible with effectively treated malignancy. 2. The left adnexal/pelvic sidewall lesion abuts the margin of the sigmoid colon, as before. 3. Other imaging findings of potential clinical significance: Aortic Atherosclerosis (ICD10-I70.0). Mild mitral valve calcification. Lower lumbar impingement. Small umbilical hernia contains adipose tissue. Electronically Signed   By: WVan ClinesM.D.   On: 01/02/2022 11:27

## 2022-01-03 NOTE — Assessment & Plan Note (Signed)
I have reviewed multiple imaging studies with the patient and her sister The patient has been on immunotherapy for 4 years with no signs of cancer progression We discussed the risk and benefits of discontinuing treatment versus continuing indefinitely For now, she would like to continue treatment indefinitely I recommend spacing out interval imaging study to once a year, due June 2024

## 2022-01-03 NOTE — Assessment & Plan Note (Signed)
We have extensive discussions about the importance of weight loss and healthy lifestyle I recommend the patient to increase physical activity as tolerated

## 2022-01-04 ENCOUNTER — Telehealth: Payer: Self-pay

## 2022-01-04 NOTE — Telephone Encounter (Signed)
She said that she is just taking a July break and will resume chemo in August.

## 2022-01-04 NOTE — Telephone Encounter (Signed)
Did she decide to take a treatment break? If so I have to cancel her chemo appt

## 2022-01-04 NOTE — Telephone Encounter (Signed)
Returned her call. She is canceling 7/20 appts and after the office visit yesterday. She will keep 8/10 appts as scheduled.

## 2022-01-04 NOTE — Telephone Encounter (Signed)
ok 

## 2022-01-22 ENCOUNTER — Telehealth: Payer: Self-pay

## 2022-01-22 NOTE — Telephone Encounter (Signed)
Patient called to request infusion appointment on 8/10 to be canceled. Dr. Alvy Bimler had given patient option to take a break from treatment and patient would like to extend break. Infusion appointment canceled per pt request. Confirmed additional upcoming appointments with patient.  Patient verbalized an understanding of the information.

## 2022-01-24 ENCOUNTER — Inpatient Hospital Stay: Payer: Medicare Other

## 2022-01-28 ENCOUNTER — Other Ambulatory Visit: Payer: Self-pay

## 2022-02-01 ENCOUNTER — Other Ambulatory Visit: Payer: Self-pay

## 2022-02-02 ENCOUNTER — Other Ambulatory Visit: Payer: Self-pay

## 2022-02-14 ENCOUNTER — Inpatient Hospital Stay: Payer: Medicare Other

## 2022-02-14 ENCOUNTER — Other Ambulatory Visit: Payer: Self-pay

## 2022-02-14 ENCOUNTER — Encounter: Payer: Self-pay | Admitting: Hematology and Oncology

## 2022-02-14 ENCOUNTER — Inpatient Hospital Stay: Payer: Medicare Other | Attending: Hematology and Oncology | Admitting: Hematology and Oncology

## 2022-02-14 DIAGNOSIS — D638 Anemia in other chronic diseases classified elsewhere: Secondary | ICD-10-CM | POA: Insufficient documentation

## 2022-02-14 DIAGNOSIS — C541 Malignant neoplasm of endometrium: Secondary | ICD-10-CM | POA: Diagnosis present

## 2022-02-14 DIAGNOSIS — Z79899 Other long term (current) drug therapy: Secondary | ICD-10-CM | POA: Diagnosis not present

## 2022-02-14 DIAGNOSIS — E039 Hypothyroidism, unspecified: Secondary | ICD-10-CM | POA: Insufficient documentation

## 2022-02-14 DIAGNOSIS — Z9221 Personal history of antineoplastic chemotherapy: Secondary | ICD-10-CM | POA: Insufficient documentation

## 2022-02-14 DIAGNOSIS — Z923 Personal history of irradiation: Secondary | ICD-10-CM | POA: Insufficient documentation

## 2022-02-14 DIAGNOSIS — C787 Secondary malignant neoplasm of liver and intrahepatic bile duct: Secondary | ICD-10-CM

## 2022-02-14 LAB — CBC WITH DIFFERENTIAL/PLATELET
Abs Immature Granulocytes: 0.02 10*3/uL (ref 0.00–0.07)
Basophils Absolute: 0.1 10*3/uL (ref 0.0–0.1)
Basophils Relative: 1 %
Eosinophils Absolute: 0.3 10*3/uL (ref 0.0–0.5)
Eosinophils Relative: 4 %
HCT: 34 % — ABNORMAL LOW (ref 36.0–46.0)
Hemoglobin: 11.4 g/dL — ABNORMAL LOW (ref 12.0–15.0)
Immature Granulocytes: 0 %
Lymphocytes Relative: 22 %
Lymphs Abs: 1.8 10*3/uL (ref 0.7–4.0)
MCH: 26.4 pg (ref 26.0–34.0)
MCHC: 33.5 g/dL (ref 30.0–36.0)
MCV: 78.7 fL — ABNORMAL LOW (ref 80.0–100.0)
Monocytes Absolute: 0.5 10*3/uL (ref 0.1–1.0)
Monocytes Relative: 7 %
Neutro Abs: 5.2 10*3/uL (ref 1.7–7.7)
Neutrophils Relative %: 66 %
Platelets: 203 10*3/uL (ref 150–400)
RBC: 4.32 MIL/uL (ref 3.87–5.11)
RDW: 13.8 % (ref 11.5–15.5)
WBC: 7.9 10*3/uL (ref 4.0–10.5)
nRBC: 0 % (ref 0.0–0.2)

## 2022-02-14 LAB — COMPREHENSIVE METABOLIC PANEL
ALT: 13 U/L (ref 0–44)
AST: 15 U/L (ref 15–41)
Albumin: 3.9 g/dL (ref 3.5–5.0)
Alkaline Phosphatase: 117 U/L (ref 38–126)
Anion gap: 5 (ref 5–15)
BUN: 19 mg/dL (ref 8–23)
CO2: 30 mmol/L (ref 22–32)
Calcium: 9.3 mg/dL (ref 8.9–10.3)
Chloride: 103 mmol/L (ref 98–111)
Creatinine, Ser: 0.74 mg/dL (ref 0.44–1.00)
GFR, Estimated: >60 mL/min (ref >60)
Glucose, Bld: 93 mg/dL (ref 70–99)
Potassium: 3.9 mmol/L (ref 3.5–5.1)
Sodium: 138 mmol/L (ref 135–145)
Total Bilirubin: 0.5 mg/dL (ref 0.3–1.2)
Total Protein: 7.3 g/dL (ref 6.5–8.1)

## 2022-02-14 LAB — TSH: TSH: 1.759 u[IU]/mL (ref 0.350–4.500)

## 2022-02-14 LAB — T4, FREE: Free T4: 1.23 ng/dL — ABNORMAL HIGH (ref 0.61–1.12)

## 2022-02-14 MED ORDER — HEPARIN SOD (PORK) LOCK FLUSH 100 UNIT/ML IV SOLN
500.0000 [IU] | Freq: Once | INTRAVENOUS | Status: AC
  Administered 2022-02-14: 500 [IU]

## 2022-02-14 MED ORDER — SODIUM CHLORIDE 0.9% FLUSH
10.0000 mL | Freq: Once | INTRAVENOUS | Status: AC
  Administered 2022-02-14: 10 mL

## 2022-02-14 NOTE — Progress Notes (Signed)
[place] OFFICE PROGRESS NOTE  Patient Care Team: [name], MD as PCP - General (Family Medicine)  ASSESSMENT & PLAN:  Recurrent carcinoma of endometrium [facility]) The patient is taking a treatment break She has no signs or symptoms to suggest cancer recurrence I plan to repeat CT imaging in December Continue close monitoring The patient is advised to call me if she has signs and symptoms of abdominal bloating, nausea or changes in bowel habits  Anemia, chronic disease She has mild intermittent anemia chronic illness Observed for now  Acquired hypothyroidism She has intermittent elevated TSH We will adjust the dose of her treatment accordingly I anticipate that she will need less thyroid replacement due to recent discontinuation of treatment  No orders of the defined types were placed in this encounter.   All questions were answered. The patient knows to call the clinic with any problems, questions or concerns. The total time spent in the appointment was 20 minutes encounter with patients including review of chart and various tests results, discussions about plan of care and coordination of care plan   Heath Lark, MD 02/14/2022 2:37 PM  INTERVAL HISTORY: Please see below for problem oriented charting. she returns for surveillance follow-up for history of metastatic uterine cancer She is doing well She has no recent changes in bowel habits, abdominal pain or bloating  REVIEW OF SYSTEMS:   Constitutional: Denies fevers, chills or abnormal weight loss Eyes: Denies blurriness of vision Ears, nose, mouth, throat, and face: Denies mucositis or sore throat Respiratory: Denies cough, dyspnea or wheezes Cardiovascular: Denies palpitation, chest discomfort or lower extremity swelling Gastrointestinal:  Denies nausea, heartburn or change in bowel habits Skin: Denies abnormal skin rashes Lymphatics: Denies new lymphadenopathy or easy bruising Neurological:Denies  numbness, tingling or new weaknesses Behavioral/Psych: Mood is stable, no new changes  All other systems were reviewed with the patient and are negative.  I have reviewed the past medical history, past surgical history, social history and family history with the patient and they are unchanged from previous note.  ALLERGIES:  is allergic to latex and lisinopril.  MEDICATIONS:  Current Outpatient Medications  Medication Sig Dispense Refill   acetaminophen (TYLENOL) 325 MG tablet Take 650 mg by mouth every 6 (six) hours as needed for mild pain or moderate pain.      amoxicillin (AMOXIL) 500 MG capsule Take 2,000 mg by mouth See admin instructions. Prior to dental appointment, takes 4 tables prior to appt     Biotin w/ Vitamins C & E (HAIR/SKIN/NAILS PO) Take 1 tablet by mouth daily.     Calcium Carb-Cholecalciferol (CALCIUM 600 + D PO) Take 2 tablets by mouth daily.     diclofenac (VOLTAREN) 75 MG EC tablet Take 75 mg by mouth 2 (two) times daily as needed.     fluticasone (FLONASE) 50 MCG/ACT nasal spray Place 1 spray into both nostrils 2 (two) times daily as needed for allergies.   1   gabapentin (NEURONTIN) 600 MG tablet TAKE 1 TABLET BY MOUTH TWICE A DAY FOR RESTLESS LEGS AND NEUROPATHY. 60 tablet 11   levothyroxine (SYNTHROID) 150 MCG tablet TAKE 1 TABLET BY MOUTH EVERY DAY BEFORE BREAKFAST (Patient taking differently: Take 175 mcg by mouth daily before breakfast. New Rx not needed.) 90 tablet 1   lidocaine-prilocaine (EMLA) cream APPLY TO AFFECTED AREA ONCE AS DIRECTED 30 g 3   loratadine (CLARITIN) 10 MG tablet Take 10 mg by mouth daily.     LORazepam (ATIVAN) 1  MG tablet Take 1 tablet (1 mg total) by mouth every 8 (eight) hours as needed for anxiety or sleep. 90 tablet 0   losartan-hydrochlorothiazide (HYZAAR) 100-12.5 MG tablet Take 0.5 tablets by mouth daily. 60 tablet 9   magnesium oxide (MAG-OX) 400 MG tablet Take 1 tablet by mouth 2 (two) times daily.     meclizine (ANTIVERT) 25 MG  tablet Take 25 mg by mouth 3 (three) times daily. Pt has vertigo  Take for 7 days     Melatonin 10 MG CAPS Take 10 mg by mouth at bedtime.     niacinamide 500 MG tablet Take 500 mg by mouth 3 (three) times daily with meals.     ondansetron (ZOFRAN) 8 MG tablet Take 1 tablet (8 mg total) by mouth every 8 (eight) hours as needed for nausea. 60 tablet 1   pramipexole (MIRAPEX) 0.5 MG tablet Take 1 mg by mouth at bedtime.     No current facility-administered medications for this visit.    SUMMARY OF ONCOLOGIC HISTORY: Oncology History Overview Note  MSI stable on June 2017 tissue but high from [facility] study from November 2017  05/15/16: ER is moderately positive (70%). PR is strongly positive (80%).   Recurrent carcinoma of endometrium ([place])  01/02/2016 Pathology Results   Uterus +/- tubes/ovaries, neoplastic, cervix ENDOMETRIAL ADENOCARCINOMA, FIGO GRADE 2 (4.7 CM) THE TUMOR INVADES LESS THAN ONE-HALF OF THE MYOMETRIUM (PT1A) ALL MARGINS OF RESECTION ARE NEGATIVE FOR CARCINOMA LEIOMYOMAS AND ADENOMYOSIS BILATERAL FALLOPIAN TUBES AND OVARIES: HISTOLOGICAL UNREMARKABLE 2. Lymph node, sentinel, biopsy, right obturator ONE BENIGN LYMPH NODE (0/1) 3. Lymph nodes, regional resection, left pelvic FOUR BENIGN LYMPH NODES (0/4)   01/02/2016 Surgery   Dr. Denman George performed robotic-assisted laparoscopic total hysterectomy with bilateral salpingoophorectomy, sentinel lymph node biopsy, lymphadenectomy      05/15/2016 Pathology Results   Vagina, biopsy, mid - ADENOCARCINOMA, SEE COMMENT. Microscopic Comment The morphology along with the patient's history are consistent with recurrent endometrioid adenocarcinoma. The carcinoma has a similar appearance to the primary ([id number]).   05/20/2016 Imaging   Ct scan abdomen showed solid 2.5 cm peritoneal mass in the mid to anterior left pelvis, suspicious for peritoneal metastasis. 2. Small expansile low-attenuation filling defect in the left  external iliac vein, cannot exclude a small deep venous thrombus. Consider correlation with left lower extremity venous Doppler scan. 3. Small simple fluid density structure in the left pelvic sidewall abutting the left external iliac vessels, favor a small postoperative seroma. 4. No ascites. 5. No lymphadenopathy.  No metastatic disease in the chest. 6. Aortic atherosclerosis.   06/12/2016 PET scan   Intensely hypermetabolic 2.1 cm central pelvic peritoneal mass just to the left of midline, consistent with peritoneal metastatic recurrence. No ascites. 2. No additional hypermetabolic sites of metastatic disease. 3. Diffuse thyroid hypermetabolism without discrete thyroid nodule, favoring thyroiditis. Recommend correlation with serum thyroid function tests.   06/24/2016 - 07/22/2016 Chemotherapy   The patient had weekly cisplatin. She has missed several doses due to infection and pancytopenia   06/24/2016 - 09/04/2016 Radiation Therapy   She completed concurrent radiation therapy Radiation treatment dates:   IMRT : 06/24/16 - 08/01/16 HDR : 08/13/16, 08/20/16, 08/27/16, 09/04/16   Site/dose:   Pelvis treated to 55 Gy in 25 fractions (simultaneous integrated boost technique) Vaginal Cuff treated to 24 Gy in 4 fractions   08/15/2016 - 12/03/2016 Chemotherapy   She received 6 cycles of carboplatin/Taxol   09/27/2016 Imaging   Interval decrease in size of previously  described solid peritoneal nodule within the left anterior pelvis. Near complete resolution of previously described low-attenuation structure along the left pelvic sidewall. No evidence for metastatic disease in the chest. Aortic atherosclerosis.   12/30/2016 Imaging   Ct abdomen 1. Solitary left pelvic peritoneal implant is mildly decreased in size in the interval. 2. No new or progressive metastatic disease in the abdomen or pelvis. No ascites. 3. Aortic atherosclerosis.   04/02/2017 Imaging   Left lower quadrant peritoneal implant  referenced on previous exam measures 1.7 x 1.3 cm, image 69 of series 2. Increased from 0.8 x 0.8 cm previously peer no new peritoneal implants identified.   Musculoskeletal: The degenerative disc disease noted within the lumbar spine.   IMPRESSION: 1. Solitary left pelvic peritoneal implant is increased in size in the interval. 2. No new sites of disease.  No ascites. 3. Aortic atherosclerosis   04/11/2017 PET scan   1. The left side of pelvis peritoneal implant has decreased in size and degree of FDG uptake compatible with response to therapy. No new areas of peritoneal disease identified. 2. Persistent diffuse increased uptake within the thyroid gland. Correlation with patient's thyroid function may be helpful.   05/26/2017 Imaging   1. Enlarging tumor implant along the left adnexa, currently 2.6 by 2.4 cm and previously 1.9 by 1.3 cm. No new tumor implant or other specific cause for the patient's pelvic symptoms is currently identified. 2.  Aortic Atherosclerosis (ICD10-I70.0). 3. Lumbar spondylosis and degenerative disc disease causing multilevel impingement.   06/04/2017 - 08/28/2017 Chemotherapy   The patient started letrozole and everolimus   08/26/2017 Imaging   Increased size of mass in the left adnexal region.  Several new small liver metastases in right hepatic lobe   09/08/2017 Imaging   LV EF: 60% -   65%   09/10/2017 Procedure   Technically successful right IJ power-injectable port catheter placement. Ready for routine use   12/04/2017 Imaging   1. Interval increase in size and number of multiple lesions within the liver compatible with hepatic metastatic disease. 2. Interval increase in size mass within the left hemipelvis.   12/15/2017 -  Chemotherapy   The patient had pembrolizumab (KEYTRUDA) 200 mg in sodium chloride 0.9 % 50 mL chemo infusion, 200 mg, Intravenous, Once, 1 of 6 cycles Administration: 200 mg (12/15/2017)  for chemotherapy treatment.    12/21/2017  Imaging   1. Moderate left hydronephrosis and hydroureter, similar compared to most recent CT from May 2019; obstruction appears to be secondary to a left pelvic mass/metastatic focus which has increased in size since the prior CT. 2. Numerous hepatic metastatic lesions, suspect that some may be increased in size but difficult to further characterize without intravenous contrast. Increased size of left pelvic soft tissue mass/metastatic lesion. Mass abuts and possibly invades the sigmoid colon.     02/20/2018 Imaging   Interval decrease in hepatic metastases.  Decreased mass or lymphadenopathy in the left external iliac chain. Interval resolution of left hydroureteronephrosis.  No new or progressive disease within the abdomen or pelvis.   05/16/2018 Imaging   05/16/2018 CT Abdomen IMPRESSION: 1. Slight interval decrease in size of hepatic metastatic disease. 2. Slight interval decrease in size centrally necrotic mass within the left external iliac region.   09/18/2018 Imaging   1. Today's study demonstrates a mixed response to therapy. Specifically, while the previously noted hepatic metastases appear decreased, the soft tissue mass along the left pelvic sidewall appears slightly larger than the prior study. No  new metastatic disease is noted elsewhere in the abdomen or pelvis. 2. Aortic atherosclerosis. 3. Mild cardiomegaly. 4. Additional incidental findings, as above     03/12/2019 Imaging   1. Overall improvement with reduced size of the hepatic metastatic lesions, and reduced size of the irregular soft tissue mass in the left pelvis. 2. Other imaging findings of potential clinical significance: Mild distal esophageal wall thickening, cannot exclude esophagitis. Aortic Atherosclerosis (ICD10-I70.0). Lumbar impingement at L5-S1.     09/09/2019 Imaging   1. Stable size and appearance of the somewhat irregular left adnexal mass, currently 2.8 by 1.9 cm. 2. The scattered hepatic metastatic  lesions are stable from 03/12/2019, and represent effectively treated metastatic lesions. 3. Other imaging findings of potential clinical significance: Airway thickening is present, suggesting bronchitis or reactive airways disease. Lumbar spondylosis and degenerative disc disease causing impingement at L4-5 and L5-S1. Small umbilical hernia contains adipose tissue.   Aortic Atherosclerosis (ICD10-I70.0).   03/07/2020 Imaging   1. Stable appearance of LEFT pelvic mass without new signs of disease. Mass remains closely approximated to the colon, in the vicinity of the distal LEFT ureter and the iliac vessels. 2. Stable signs of hepatic metastatic disease with calcified lesion at the dome of the liver and subtle areas of low attenuation elsewhere which show no change.   09/12/2020 Imaging   1. Stable appearance of the partially calcified LEFT pelvic sidewall soft tissue mass with unchanged involvement of the sigmoid colon, iliac vessels and close proximity to the left ureter. No new signs of disease. 2. Stable signs of the hepatic metastatic disease with calcified lesion at the hepatic dome and subcentimeter area of low attenuation in the right hepatic lobe. 3. No evidence of new or enlarging abdominopelvic metastatic disease. 4. Aortic atherosclerosis.   03/15/2021 Imaging   1. Stable exam. No new or progressive interval findings. 2. Spiculated calcified soft tissue lesion along the left pelvic sidewall is stable in the interval. This lesion tethers the sigmoid colon and is in close proximity to the left ureter and left external iliac vasculature. 3. Stable appearance of the calcified lesion at the dome of the liver and hypoattenuating lesion in the right liver. 4. Small umbilical hernia contains only fat.   01/03/2022 Imaging   1. Residua from prior metastatic lesions in the liver and left pelvic sidewall without progression or new lesion identified. Appearance most compatible with effectively  treated malignancy. 2. The left adnexal/pelvic sidewall lesion abuts the margin of the sigmoid colon, as before. 3. Other imaging findings of potential clinical significance: Aortic Atherosclerosis (ICD10-I70.0). Mild mitral valve calcification. Lower lumbar impingement. Small umbilical hernia contains adipose tissue.     PHYSICAL EXAMINATION: ECOG PERFORMANCE STATUS: 0 - Asymptomatic  Vitals:   02/14/22 1236  BP: (!) 148/77  Pulse: 68  Resp: 18  Temp: (!) 97.4 F (36.3 C)  SpO2: 98%   Filed Weights   02/14/22 1236  Weight: 212 lb 3.2 oz (96.3 kg)    GENERAL:alert, no distress and comfortable NEURO: alert & oriented x 3 with fluent speech, no focal motor/sensory deficits  LABORATORY DATA:  I have reviewed the data as listed    Component Value Date/Time   NA 138 02/14/2022 1157   NA 141 07/09/2017 1312   K 3.9 02/14/2022 1157   K 3.9 07/09/2017 1312   CL 103 02/14/2022 1157   CO2 30 02/14/2022 1157   CO2 27 07/09/2017 1312   GLUCOSE 93 02/14/2022 1157   GLUCOSE 109 07/09/2017 1312  BUN 19 02/14/2022 1157   BUN 16.4 07/09/2017 1312   CREATININE 0.74 02/14/2022 1157   CREATININE 0.66 01/25/2021 1149   CREATININE 0.8 07/09/2017 1312   CALCIUM 9.3 02/14/2022 1157   CALCIUM 9.2 07/09/2017 1312   PROT 7.3 02/14/2022 1157   PROT 7.1 07/09/2017 1312   ALBUMIN 3.9 02/14/2022 1157   ALBUMIN 3.1 (L) 07/09/2017 1312   AST 15 02/14/2022 1157   AST 21 01/25/2021 1149   AST 35 (H) 07/09/2017 1312   ALT 13 02/14/2022 1157   ALT 20 01/25/2021 1149   ALT 43 07/09/2017 1312   ALKPHOS 117 02/14/2022 1157   ALKPHOS 182 (H) 07/09/2017 1312   BILITOT 0.5 02/14/2022 1157   BILITOT 0.6 01/25/2021 1149   BILITOT 0.34 07/09/2017 1312   GFRNONAA >60 02/14/2022 1157   GFRNONAA >60 01/25/2021 1149   GFRAA >60 03/29/2020 1105   GFRAA >60 01/04/2019 1351    No results found for: "SPEP", "UPEP"  Lab Results  Component Value Date   WBC 7.9 02/14/2022   NEUTROABS 5.2 02/14/2022    HGB 11.4 (L) 02/14/2022   HCT 34.0 (L) 02/14/2022   MCV 78.7 (L) 02/14/2022   PLT 203 02/14/2022      Chemistry      Component Value Date/Time   NA 138 02/14/2022 1157   NA 141 07/09/2017 1312   K 3.9 02/14/2022 1157   K 3.9 07/09/2017 1312   CL 103 02/14/2022 1157   CO2 30 02/14/2022 1157   CO2 27 07/09/2017 1312   BUN 19 02/14/2022 1157   BUN 16.4 07/09/2017 1312   CREATININE 0.74 02/14/2022 1157   CREATININE 0.66 01/25/2021 1149   CREATININE 0.8 07/09/2017 1312      Component Value Date/Time   CALCIUM 9.3 02/14/2022 1157   CALCIUM 9.2 07/09/2017 1312   ALKPHOS 117 02/14/2022 1157   ALKPHOS 182 (H) 07/09/2017 1312   AST 15 02/14/2022 1157   AST 21 01/25/2021 1149   AST 35 (H) 07/09/2017 1312   ALT 13 02/14/2022 1157   ALT 20 01/25/2021 1149   ALT 43 07/09/2017 1312   BILITOT 0.5 02/14/2022 1157   BILITOT 0.6 01/25/2021 1149   BILITOT 0.34 07/09/2017 1312

## 2022-02-14 NOTE — Assessment & Plan Note (Signed)
The patient is taking a treatment break She has no signs or symptoms to suggest cancer recurrence I plan to repeat CT imaging in December Continue close monitoring The patient is advised to call me if she has signs and symptoms of abdominal bloating, nausea or changes in bowel habits

## 2022-02-14 NOTE — Assessment & Plan Note (Signed)
She has intermittent elevated TSH We will adjust the dose of her treatment accordingly I anticipate that she will need less thyroid replacement due to recent discontinuation of treatment

## 2022-02-14 NOTE — Assessment & Plan Note (Signed)
She has mild intermittent anemia chronic illness Observed for now 

## 2022-04-11 ENCOUNTER — Other Ambulatory Visit: Payer: Self-pay

## 2022-04-11 ENCOUNTER — Inpatient Hospital Stay (HOSPITAL_BASED_OUTPATIENT_CLINIC_OR_DEPARTMENT_OTHER): Payer: Medicare Other | Admitting: Hematology and Oncology

## 2022-04-11 ENCOUNTER — Encounter: Payer: Self-pay | Admitting: Hematology and Oncology

## 2022-04-11 ENCOUNTER — Other Ambulatory Visit: Payer: Self-pay | Admitting: Hematology and Oncology

## 2022-04-11 ENCOUNTER — Inpatient Hospital Stay: Payer: Medicare Other | Attending: Hematology and Oncology

## 2022-04-11 VITALS — BP 154/77 | HR 76 | Temp 98.1°F | Resp 18 | Ht 71.0 in | Wt 216.2 lb

## 2022-04-11 DIAGNOSIS — D638 Anemia in other chronic diseases classified elsewhere: Secondary | ICD-10-CM

## 2022-04-11 DIAGNOSIS — C541 Malignant neoplasm of endometrium: Secondary | ICD-10-CM

## 2022-04-11 DIAGNOSIS — Z79899 Other long term (current) drug therapy: Secondary | ICD-10-CM | POA: Diagnosis not present

## 2022-04-11 DIAGNOSIS — E039 Hypothyroidism, unspecified: Secondary | ICD-10-CM

## 2022-04-11 DIAGNOSIS — C787 Secondary malignant neoplasm of liver and intrahepatic bile duct: Secondary | ICD-10-CM | POA: Insufficient documentation

## 2022-04-11 LAB — COMPREHENSIVE METABOLIC PANEL
ALT: 12 U/L (ref 0–44)
AST: 14 U/L — ABNORMAL LOW (ref 15–41)
Albumin: 3.9 g/dL (ref 3.5–5.0)
Alkaline Phosphatase: 113 U/L (ref 38–126)
Anion gap: 4 — ABNORMAL LOW (ref 5–15)
BUN: 23 mg/dL (ref 8–23)
CO2: 28 mmol/L (ref 22–32)
Calcium: 9.1 mg/dL (ref 8.9–10.3)
Chloride: 104 mmol/L (ref 98–111)
Creatinine, Ser: 0.78 mg/dL (ref 0.44–1.00)
GFR, Estimated: >60 mL/min (ref >60)
Glucose, Bld: 112 mg/dL — ABNORMAL HIGH (ref 70–99)
Potassium: 3.7 mmol/L (ref 3.5–5.1)
Sodium: 136 mmol/L (ref 135–145)
Total Bilirubin: 0.6 mg/dL (ref 0.3–1.2)
Total Protein: 7.3 g/dL (ref 6.5–8.1)

## 2022-04-11 LAB — CBC WITH DIFFERENTIAL/PLATELET
Abs Immature Granulocytes: 0.02 10*3/uL (ref 0.00–0.07)
Basophils Absolute: 0.1 10*3/uL (ref 0.0–0.1)
Basophils Relative: 1 %
Eosinophils Absolute: 0.2 10*3/uL (ref 0.0–0.5)
Eosinophils Relative: 3 %
HCT: 34.7 % — ABNORMAL LOW (ref 36.0–46.0)
Hemoglobin: 11.5 g/dL — ABNORMAL LOW (ref 12.0–15.0)
Immature Granulocytes: 0 %
Lymphocytes Relative: 27 %
Lymphs Abs: 1.8 10*3/uL (ref 0.7–4.0)
MCH: 26.3 pg (ref 26.0–34.0)
MCHC: 33.1 g/dL (ref 30.0–36.0)
MCV: 79.2 fL — ABNORMAL LOW (ref 80.0–100.0)
Monocytes Absolute: 0.5 10*3/uL (ref 0.1–1.0)
Monocytes Relative: 7 %
Neutro Abs: 4.3 10*3/uL (ref 1.7–7.7)
Neutrophils Relative %: 62 %
Platelets: 205 10*3/uL (ref 150–400)
RBC: 4.38 MIL/uL (ref 3.87–5.11)
RDW: 14.2 % (ref 11.5–15.5)
WBC: 6.9 10*3/uL (ref 4.0–10.5)
nRBC: 0 % (ref 0.0–0.2)

## 2022-04-11 LAB — T4, FREE: Free T4: 1.13 ng/dL — ABNORMAL HIGH (ref 0.61–1.12)

## 2022-04-11 LAB — TSH: TSH: 5.007 u[IU]/mL — ABNORMAL HIGH (ref 0.350–4.500)

## 2022-04-11 MED ORDER — SODIUM CHLORIDE 0.9% FLUSH
10.0000 mL | Freq: Once | INTRAVENOUS | Status: AC
  Administered 2022-04-11: 10 mL

## 2022-04-11 MED ORDER — HEPARIN SOD (PORK) LOCK FLUSH 100 UNIT/ML IV SOLN
500.0000 [IU] | Freq: Once | INTRAVENOUS | Status: AC
  Administered 2022-04-11: 500 [IU]

## 2022-04-11 NOTE — Assessment & Plan Note (Signed)
The patient is taking a treatment break She has no signs or symptoms to suggest cancer recurrence I plan to repeat CT imaging in November Continue close monitoring The patient is advised to call me if she has signs and symptoms of abdominal bloating, nausea or changes in bowel habits

## 2022-04-11 NOTE — Assessment & Plan Note (Signed)
She has intermittent elevated TSH We will adjust the dose of her treatment accordingly I anticipate that she will need less thyroid replacement due to recent discontinuation of treatment

## 2022-04-11 NOTE — Assessment & Plan Note (Signed)
She has mild intermittent anemia chronic illness Observed for now 

## 2022-04-11 NOTE — Progress Notes (Signed)
[facility] OFFICE PROGRESS NOTE  Patient Care Team: [name], MD as PCP - General (Family Medicine)  ASSESSMENT & PLAN:  Recurrent carcinoma of endometrium [facility]) The patient is taking a treatment break She has no signs or symptoms to suggest cancer recurrence I plan to repeat CT imaging in November Continue close monitoring The patient is advised to call me if she has signs and symptoms of abdominal bloating, nausea or changes in bowel habits  Acquired hypothyroidism She has intermittent elevated TSH We will adjust the dose of her treatment accordingly I anticipate that she will need less thyroid replacement due to recent discontinuation of treatment  Anemia, chronic disease She has mild intermittent anemia chronic illness Observed for now  Orders Placed This Encounter  Procedures   CT ABDOMEN PELVIS W CONTRAST    Standing Status:   Future    Standing Expiration Date:   04/12/2023    Order Specific Question:   If indicated for the ordered procedure, I authorize the administration of contrast media per Radiology protocol    Answer:   Yes    Order Specific Question:   Preferred imaging location?    Answer:   [facility]    Order Specific Question:   Radiology Contrast Protocol - do NOT remove file path    Answer:   [email or web address]    All questions were answered. The patient knows to call the clinic with any problems, questions or concerns. The total time spent in the appointment was 20 minutes encounter with patients including review of chart and various tests results, discussions about plan of care and coordination of care plan   Heath Lark, MD 04/11/2022 1:15 PM  INTERVAL HISTORY: Please see below for problem oriented charting. she returns for surveillance follow-up for history of uterine cancer She had cataract surgery performed recently but her vision has not improved Denies abdominal pain or changes  in bowel habits  REVIEW OF SYSTEMS:   Constitutional: Denies fevers, chills or abnormal weight loss Ears, nose, mouth, throat, and face: Denies mucositis or sore throat Respiratory: Denies cough, dyspnea or wheezes Cardiovascular: Denies palpitation, chest discomfort or lower extremity swelling Gastrointestinal:  Denies nausea, heartburn or change in bowel habits Skin: Denies abnormal skin rashes Lymphatics: Denies new lymphadenopathy or easy bruising Neurological:Denies numbness, tingling or new weaknesses Behavioral/Psych: Mood is stable, no new changes  All other systems were reviewed with the patient and are negative.  I have reviewed the past medical history, past surgical history, social history and family history with the patient and they are unchanged from previous note.  ALLERGIES:  is allergic to latex and lisinopril.  MEDICATIONS:  Current Outpatient Medications  Medication Sig Dispense Refill   acetaminophen (TYLENOL) 325 MG tablet Take 650 mg by mouth every 6 (six) hours as needed for mild pain or moderate pain.      amoxicillin (AMOXIL) 500 MG capsule Take 2,000 mg by mouth See admin instructions. Prior to dental appointment, takes 4 tables prior to appt     Biotin w/ Vitamins C & E (HAIR/SKIN/NAILS PO) Take 1 tablet by mouth daily.     Calcium Carb-Cholecalciferol (CALCIUM 600 + D PO) Take 2 tablets by mouth daily.     diclofenac (VOLTAREN) 75 MG EC tablet Take 75 mg by mouth 2 (two) times daily as needed.     fluticasone (FLONASE) 50 MCG/ACT nasal spray Place 1 spray into both nostrils 2 (two) times daily as needed for allergies.  1   gabapentin (NEURONTIN) 600 MG tablet TAKE 1 TABLET BY MOUTH TWICE A DAY FOR RESTLESS LEGS AND NEUROPATHY. 60 tablet 11   levothyroxine (SYNTHROID) 150 MCG tablet TAKE 1 TABLET BY MOUTH EVERY DAY BEFORE BREAKFAST (Patient taking differently: Take 175 mcg by mouth daily before breakfast. New Rx not needed.) 90 tablet 1   lidocaine-prilocaine  (EMLA) cream APPLY TO AFFECTED AREA ONCE AS DIRECTED 30 g 3   loratadine (CLARITIN) 10 MG tablet Take 10 mg by mouth daily.     LORazepam (ATIVAN) 1 MG tablet Take 1 tablet (1 mg total) by mouth every 8 (eight) hours as needed for anxiety or sleep. 90 tablet 0   losartan-hydrochlorothiazide (HYZAAR) 100-12.5 MG tablet Take 0.5 tablets by mouth daily. 60 tablet 9   magnesium oxide (MAG-OX) 400 MG tablet Take 1 tablet by mouth 2 (two) times daily.     meclizine (ANTIVERT) 25 MG tablet Take 25 mg by mouth 3 (three) times daily. Pt has vertigo  Take for 7 days     Melatonin 10 MG CAPS Take 10 mg by mouth at bedtime.     niacinamide 500 MG tablet Take 500 mg by mouth 3 (three) times daily with meals.     ondansetron (ZOFRAN) 8 MG tablet Take 1 tablet (8 mg total) by mouth every 8 (eight) hours as needed for nausea. 60 tablet 1   pramipexole (MIRAPEX) 0.5 MG tablet Take 1 mg by mouth at bedtime.     No current facility-administered medications for this visit.    SUMMARY OF ONCOLOGIC HISTORY: Oncology History Overview Note  MSI stable on June 2017 tissue but high from [facility] study from November 2017  05/15/16: ER is moderately positive (70%). PR is strongly positive (80%).   Recurrent carcinoma of endometrium ([place])  01/02/2016 Pathology Results   Uterus +/- tubes/ovaries, neoplastic, cervix ENDOMETRIAL ADENOCARCINOMA, FIGO GRADE 2 (4.7 CM) THE TUMOR INVADES LESS THAN ONE-HALF OF THE MYOMETRIUM (PT1A) ALL MARGINS OF RESECTION ARE NEGATIVE FOR CARCINOMA LEIOMYOMAS AND ADENOMYOSIS BILATERAL FALLOPIAN TUBES AND OVARIES: HISTOLOGICAL UNREMARKABLE 2. Lymph node, sentinel, biopsy, right obturator ONE BENIGN LYMPH NODE (0/1) 3. Lymph nodes, regional resection, left pelvic FOUR BENIGN LYMPH NODES (0/4)   01/02/2016 Surgery   Dr. Denman George performed robotic-assisted laparoscopic total hysterectomy with bilateral salpingoophorectomy, sentinel lymph node biopsy, lymphadenectomy      05/15/2016  Pathology Results   Vagina, biopsy, mid - ADENOCARCINOMA, SEE COMMENT. Microscopic Comment The morphology along with the patient's history are consistent with recurrent endometrioid adenocarcinoma. The carcinoma has a similar appearance to the primary ([id number]).   05/20/2016 Imaging   Ct scan abdomen showed solid 2.5 cm peritoneal mass in the mid to anterior left pelvis, suspicious for peritoneal metastasis. 2. Small expansile low-attenuation filling defect in the left external iliac vein, cannot exclude a small deep venous thrombus. Consider correlation with left lower extremity venous Doppler scan. 3. Small simple fluid density structure in the left pelvic sidewall abutting the left external iliac vessels, favor a small postoperative seroma. 4. No ascites. 5. No lymphadenopathy.  No metastatic disease in the chest. 6. Aortic atherosclerosis.   06/12/2016 PET scan   Intensely hypermetabolic 2.1 cm central pelvic peritoneal mass just to the left of midline, consistent with peritoneal metastatic recurrence. No ascites. 2. No additional hypermetabolic sites of metastatic disease. 3. Diffuse thyroid hypermetabolism without discrete thyroid nodule, favoring thyroiditis. Recommend correlation with serum thyroid function tests.   06/24/2016 - 07/22/2016 Chemotherapy   The patient had weekly  cisplatin. She has missed several doses due to infection and pancytopenia   06/24/2016 - 09/04/2016 Radiation Therapy   She completed concurrent radiation therapy Radiation treatment dates:   IMRT : 06/24/16 - 08/01/16 HDR : 08/13/16, 08/20/16, 08/27/16, 09/04/16   Site/dose:   Pelvis treated to 55 Gy in 25 fractions (simultaneous integrated boost technique) Vaginal Cuff treated to 24 Gy in 4 fractions   08/15/2016 - 12/03/2016 Chemotherapy   She received 6 cycles of carboplatin/Taxol   09/27/2016 Imaging   Interval decrease in size of previously described solid peritoneal nodule within the left anterior pelvis. Near  complete resolution of previously described low-attenuation structure along the left pelvic sidewall. No evidence for metastatic disease in the chest. Aortic atherosclerosis.   12/30/2016 Imaging   Ct abdomen 1. Solitary left pelvic peritoneal implant is mildly decreased in size in the interval. 2. No new or progressive metastatic disease in the abdomen or pelvis. No ascites. 3. Aortic atherosclerosis.   04/02/2017 Imaging   Left lower quadrant peritoneal implant referenced on previous exam measures 1.7 x 1.3 cm, image 69 of series 2. Increased from 0.8 x 0.8 cm previously peer no new peritoneal implants identified.   Musculoskeletal: The degenerative disc disease noted within the lumbar spine.   IMPRESSION: 1. Solitary left pelvic peritoneal implant is increased in size in the interval. 2. No new sites of disease.  No ascites. 3. Aortic atherosclerosis   04/11/2017 PET scan   1. The left side of pelvis peritoneal implant has decreased in size and degree of FDG uptake compatible with response to therapy. No new areas of peritoneal disease identified. 2. Persistent diffuse increased uptake within the thyroid gland. Correlation with patient's thyroid function may be helpful.   05/26/2017 Imaging   1. Enlarging tumor implant along the left adnexa, currently 2.6 by 2.4 cm and previously 1.9 by 1.3 cm. No new tumor implant or other specific cause for the patient's pelvic symptoms is currently identified. 2.  Aortic Atherosclerosis (ICD10-I70.0). 3. Lumbar spondylosis and degenerative disc disease causing multilevel impingement.   06/04/2017 - 08/28/2017 Chemotherapy   The patient started letrozole and everolimus   08/26/2017 Imaging   Increased size of mass in the left adnexal region.  Several new small liver metastases in right hepatic lobe   09/08/2017 Imaging   LV EF: 60% -   65%   09/10/2017 Procedure   Technically successful right IJ power-injectable port catheter placement. Ready  for routine use   12/04/2017 Imaging   1. Interval increase in size and number of multiple lesions within the liver compatible with hepatic metastatic disease. 2. Interval increase in size mass within the left hemipelvis.   12/15/2017 -  Chemotherapy   The patient had pembrolizumab (KEYTRUDA) 200 mg in sodium chloride 0.9 % 50 mL chemo infusion, 200 mg, Intravenous, Once, 1 of 6 cycles Administration: 200 mg (12/15/2017)  for chemotherapy treatment.    12/21/2017 Imaging   1. Moderate left hydronephrosis and hydroureter, similar compared to most recent CT from May 2019; obstruction appears to be secondary to a left pelvic mass/metastatic focus which has increased in size since the prior CT. 2. Numerous hepatic metastatic lesions, suspect that some may be increased in size but difficult to further characterize without intravenous contrast. Increased size of left pelvic soft tissue mass/metastatic lesion. Mass abuts and possibly invades the sigmoid colon.     02/20/2018 Imaging   Interval decrease in hepatic metastases.  Decreased mass or lymphadenopathy in the left external iliac  chain. Interval resolution of left hydroureteronephrosis.  No new or progressive disease within the abdomen or pelvis.   05/16/2018 Imaging   05/16/2018 CT Abdomen IMPRESSION: 1. Slight interval decrease in size of hepatic metastatic disease. 2. Slight interval decrease in size centrally necrotic mass within the left external iliac region.   09/18/2018 Imaging   1. Today's study demonstrates a mixed response to therapy. Specifically, while the previously noted hepatic metastases appear decreased, the soft tissue mass along the left pelvic sidewall appears slightly larger than the prior study. No new metastatic disease is noted elsewhere in the abdomen or pelvis. 2. Aortic atherosclerosis. 3. Mild cardiomegaly. 4. Additional incidental findings, as above     03/12/2019 Imaging   1. Overall improvement with reduced  size of the hepatic metastatic lesions, and reduced size of the irregular soft tissue mass in the left pelvis. 2. Other imaging findings of potential clinical significance: Mild distal esophageal wall thickening, cannot exclude esophagitis. Aortic Atherosclerosis (ICD10-I70.0). Lumbar impingement at L5-S1.     09/09/2019 Imaging   1. Stable size and appearance of the somewhat irregular left adnexal mass, currently 2.8 by 1.9 cm. 2. The scattered hepatic metastatic lesions are stable from 03/12/2019, and represent effectively treated metastatic lesions. 3. Other imaging findings of potential clinical significance: Airway thickening is present, suggesting bronchitis or reactive airways disease. Lumbar spondylosis and degenerative disc disease causing impingement at L4-5 and L5-S1. Small umbilical hernia contains adipose tissue.   Aortic Atherosclerosis (ICD10-I70.0).   03/07/2020 Imaging   1. Stable appearance of LEFT pelvic mass without new signs of disease. Mass remains closely approximated to the colon, in the vicinity of the distal LEFT ureter and the iliac vessels. 2. Stable signs of hepatic metastatic disease with calcified lesion at the dome of the liver and subtle areas of low attenuation elsewhere which show no change.   09/12/2020 Imaging   1. Stable appearance of the partially calcified LEFT pelvic sidewall soft tissue mass with unchanged involvement of the sigmoid colon, iliac vessels and close proximity to the left ureter. No new signs of disease. 2. Stable signs of the hepatic metastatic disease with calcified lesion at the hepatic dome and subcentimeter area of low attenuation in the right hepatic lobe. 3. No evidence of new or enlarging abdominopelvic metastatic disease. 4. Aortic atherosclerosis.   03/15/2021 Imaging   1. Stable exam. No new or progressive interval findings. 2. Spiculated calcified soft tissue lesion along the left pelvic sidewall is stable in the interval. This  lesion tethers the sigmoid colon and is in close proximity to the left ureter and left external iliac vasculature. 3. Stable appearance of the calcified lesion at the dome of the liver and hypoattenuating lesion in the right liver. 4. Small umbilical hernia contains only fat.   01/03/2022 Imaging   1. Residua from prior metastatic lesions in the liver and left pelvic sidewall without progression or new lesion identified. Appearance most compatible with effectively treated malignancy. 2. The left adnexal/pelvic sidewall lesion abuts the margin of the sigmoid colon, as before. 3. Other imaging findings of potential clinical significance: Aortic Atherosclerosis (ICD10-I70.0). Mild mitral valve calcification. Lower lumbar impingement. Small umbilical hernia contains adipose tissue.     PHYSICAL EXAMINATION: ECOG PERFORMANCE STATUS: 0 - Asymptomatic  Vitals:   04/11/22 1206  BP: (!) 154/77  Pulse: 76  Resp: 18  Temp: 98.1 F (36.7 C)  SpO2: 98%   Filed Weights   04/11/22 1206  Weight: 216 lb 3.2 oz (98.1 kg)  GENERAL:alert, no distress and comfortable SKIN: skin color, texture, turgor are normal, no rashes or significant lesions EYES: normal, Conjunctiva are pink and non-injected, sclera clear OROPHARYNX:no exudate, no erythema and lips, buccal mucosa, and tongue normal  NECK: supple, thyroid normal size, non-tender, without nodularity LYMPH:  no palpable lymphadenopathy in the cervical, axillary or inguinal LUNGS: clear to auscultation and percussion with normal breathing effort HEART: regular rate & rhythm and no murmurs and no lower extremity edema ABDOMEN:abdomen soft, non-tender and normal bowel sounds Musculoskeletal:no cyanosis of digits and no clubbing  NEURO: alert & oriented x 3 with fluent speech, no focal motor/sensory deficits  LABORATORY DATA:  I have reviewed the data as listed    Component Value Date/Time   NA 136 04/11/2022 1146   NA 141 07/09/2017 1312   K  3.7 04/11/2022 1146   K 3.9 07/09/2017 1312   CL 104 04/11/2022 1146   CO2 28 04/11/2022 1146   CO2 27 07/09/2017 1312   GLUCOSE 112 (H) 04/11/2022 1146   GLUCOSE 109 07/09/2017 1312   BUN 23 04/11/2022 1146   BUN 16.4 07/09/2017 1312   CREATININE 0.78 04/11/2022 1146   CREATININE 0.66 01/25/2021 1149   CREATININE 0.8 07/09/2017 1312   CALCIUM 9.1 04/11/2022 1146   CALCIUM 9.2 07/09/2017 1312   PROT 7.3 04/11/2022 1146   PROT 7.1 07/09/2017 1312   ALBUMIN 3.9 04/11/2022 1146   ALBUMIN 3.1 (L) 07/09/2017 1312   AST 14 (L) 04/11/2022 1146   AST 21 01/25/2021 1149   AST 35 (H) 07/09/2017 1312   ALT 12 04/11/2022 1146   ALT 20 01/25/2021 1149   ALT 43 07/09/2017 1312   ALKPHOS 113 04/11/2022 1146   ALKPHOS 182 (H) 07/09/2017 1312   BILITOT 0.6 04/11/2022 1146   BILITOT 0.6 01/25/2021 1149   BILITOT 0.34 07/09/2017 1312   GFRNONAA >60 04/11/2022 1146   GFRNONAA >60 01/25/2021 1149   GFRAA >60 03/29/2020 1105   GFRAA >60 01/04/2019 1351    No results found for: "SPEP", "UPEP"  Lab Results  Component Value Date   WBC 6.9 04/11/2022   NEUTROABS 4.3 04/11/2022   HGB 11.5 (L) 04/11/2022   HCT 34.7 (L) 04/11/2022   MCV 79.2 (L) 04/11/2022   PLT 205 04/11/2022      Chemistry      Component Value Date/Time   NA 136 04/11/2022 1146   NA 141 07/09/2017 1312   K 3.7 04/11/2022 1146   K 3.9 07/09/2017 1312   CL 104 04/11/2022 1146   CO2 28 04/11/2022 1146   CO2 27 07/09/2017 1312   BUN 23 04/11/2022 1146   BUN 16.4 07/09/2017 1312   CREATININE 0.78 04/11/2022 1146   CREATININE 0.66 01/25/2021 1149   CREATININE 0.8 07/09/2017 1312      Component Value Date/Time   CALCIUM 9.1 04/11/2022 1146   CALCIUM 9.2 07/09/2017 1312   ALKPHOS 113 04/11/2022 1146   ALKPHOS 182 (H) 07/09/2017 1312   AST 14 (L) 04/11/2022 1146   AST 21 01/25/2021 1149   AST 35 (H) 07/09/2017 1312   ALT 12 04/11/2022 1146   ALT 20 01/25/2021 1149   ALT 43 07/09/2017 1312   BILITOT 0.6  04/11/2022 1146   BILITOT 0.6 01/25/2021 1149   BILITOT 0.34 07/09/2017 1312

## 2022-04-12 ENCOUNTER — Other Ambulatory Visit: Payer: Self-pay | Admitting: Hematology and Oncology

## 2022-04-12 ENCOUNTER — Other Ambulatory Visit: Payer: Self-pay

## 2022-04-12 ENCOUNTER — Telehealth: Payer: Self-pay

## 2022-04-12 MED ORDER — LEVOTHYROXINE SODIUM 175 MCG PO TABS: 175.0000 ug | ORAL_TABLET | Freq: Every day | ORAL | 0 refills | Status: DC

## 2022-04-12 NOTE — Telephone Encounter (Signed)
Called and given below message. Rx sent to preferred pharamcy. She verbalized understanding.

## 2022-04-12 NOTE — Telephone Encounter (Signed)
-----   Message from Heath Lark, MD sent at 04/12/2022  8:33 AM EDT ----- Regarding: TSH Her TSH is ok, continue 175 mcg Can you call and ask if she needs refill? If yes, call in 90 tabs no refills

## 2022-06-04 ENCOUNTER — Ambulatory Visit (HOSPITAL_COMMUNITY)
Admission: RE | Admit: 2022-06-04 | Discharge: 2022-06-04 | Disposition: A | Discharge status: Home / Self Care | Payer: Medicare Other | Source: Ambulatory Visit | Attending: Hematology and Oncology | Admitting: Hematology and Oncology

## 2022-06-04 ENCOUNTER — Other Ambulatory Visit: Payer: Self-pay

## 2022-06-04 ENCOUNTER — Inpatient Hospital Stay: Payer: Medicare Other | Attending: Hematology and Oncology

## 2022-06-04 DIAGNOSIS — C541 Malignant neoplasm of endometrium: Secondary | ICD-10-CM | POA: Diagnosis present

## 2022-06-04 DIAGNOSIS — E663 Overweight: Secondary | ICD-10-CM | POA: Insufficient documentation

## 2022-06-04 DIAGNOSIS — C787 Secondary malignant neoplasm of liver and intrahepatic bile duct: Secondary | ICD-10-CM | POA: Insufficient documentation

## 2022-06-04 DIAGNOSIS — D638 Anemia in other chronic diseases classified elsewhere: Secondary | ICD-10-CM | POA: Insufficient documentation

## 2022-06-04 DIAGNOSIS — Z79899 Other long term (current) drug therapy: Secondary | ICD-10-CM | POA: Insufficient documentation

## 2022-06-04 DIAGNOSIS — E039 Hypothyroidism, unspecified: Secondary | ICD-10-CM | POA: Insufficient documentation

## 2022-06-04 DIAGNOSIS — M79672 Pain in left foot: Secondary | ICD-10-CM | POA: Insufficient documentation

## 2022-06-04 LAB — CBC WITH DIFFERENTIAL/PLATELET
Abs Immature Granulocytes: 0.03 10*3/uL (ref 0.00–0.07)
Basophils Absolute: 0.1 10*3/uL (ref 0.0–0.1)
Basophils Relative: 1 %
Eosinophils Absolute: 0.2 10*3/uL (ref 0.0–0.5)
Eosinophils Relative: 3 %
HCT: 36.7 % (ref 36.0–46.0)
Hemoglobin: 11.9 g/dL — ABNORMAL LOW (ref 12.0–15.0)
Immature Granulocytes: 1 %
Lymphocytes Relative: 24 %
Lymphs Abs: 1.6 10*3/uL (ref 0.7–4.0)
MCH: 25.9 pg — ABNORMAL LOW (ref 26.0–34.0)
MCHC: 32.4 g/dL (ref 30.0–36.0)
MCV: 79.8 fL — ABNORMAL LOW (ref 80.0–100.0)
Monocytes Absolute: 0.5 10*3/uL (ref 0.1–1.0)
Monocytes Relative: 7 %
Neutro Abs: 4.1 10*3/uL (ref 1.7–7.7)
Neutrophils Relative %: 64 %
Platelets: 205 10*3/uL (ref 150–400)
RBC: 4.6 MIL/uL (ref 3.87–5.11)
RDW: 14 % (ref 11.5–15.5)
WBC: 6.4 10*3/uL (ref 4.0–10.5)
nRBC: 0 % (ref 0.0–0.2)

## 2022-06-04 LAB — COMPREHENSIVE METABOLIC PANEL
ALT: 15 U/L (ref 0–44)
AST: 17 U/L (ref 15–41)
Albumin: 4 g/dL (ref 3.5–5.0)
Alkaline Phosphatase: 121 U/L (ref 38–126)
Anion gap: 4 — ABNORMAL LOW (ref 5–15)
BUN: 27 mg/dL — ABNORMAL HIGH (ref 8–23)
CO2: 29 mmol/L (ref 22–32)
Calcium: 9.4 mg/dL (ref 8.9–10.3)
Chloride: 103 mmol/L (ref 98–111)
Creatinine, Ser: 0.78 mg/dL (ref 0.44–1.00)
GFR, Estimated: >60 mL/min (ref >60)
Glucose, Bld: 89 mg/dL (ref 70–99)
Potassium: 3.5 mmol/L (ref 3.5–5.1)
Sodium: 136 mmol/L (ref 135–145)
Total Bilirubin: 0.6 mg/dL (ref 0.3–1.2)
Total Protein: 7.4 g/dL (ref 6.5–8.1)

## 2022-06-04 LAB — T4, FREE: Free T4: 1.12 ng/dL (ref 0.61–1.12)

## 2022-06-04 LAB — TSH: TSH: 4.306 u[IU]/mL (ref 0.350–4.500)

## 2022-06-04 MED ORDER — HEPARIN SOD (PORK) LOCK FLUSH 100 UNIT/ML IV SOLN
500.0000 [IU] | Freq: Once | INTRAVENOUS | Status: AC
  Administered 2022-06-04: 500 [IU] via INTRAVENOUS

## 2022-06-04 MED ORDER — HEPARIN SOD (PORK) LOCK FLUSH 100 UNIT/ML IV SOLN
INTRAVENOUS | Status: AC
  Filled 2022-06-04: qty 5

## 2022-06-04 MED ORDER — SODIUM CHLORIDE (PF) 0.9 % IJ SOLN
INTRAMUSCULAR | Status: AC
  Filled 2022-06-04: qty 50

## 2022-06-04 MED ORDER — SODIUM CHLORIDE 0.9% FLUSH
10.0000 mL | Freq: Once | INTRAVENOUS | Status: AC
  Administered 2022-06-04: 10 mL

## 2022-06-04 MED ORDER — IOHEXOL 9 MG/ML PO SOLN
1000.0000 mL | Freq: Once | ORAL | Status: AC
  Administered 2022-06-04: 1000 mL via ORAL

## 2022-06-04 MED ORDER — IOHEXOL 300 MG/ML  SOLN
100.0000 mL | Freq: Once | INTRAMUSCULAR | Status: AC | PRN
  Administered 2022-06-04: 100 mL via INTRAVENOUS

## 2022-06-06 ENCOUNTER — Other Ambulatory Visit: Payer: Self-pay

## 2022-06-06 ENCOUNTER — Encounter: Payer: Self-pay | Admitting: Hematology and Oncology

## 2022-06-06 ENCOUNTER — Inpatient Hospital Stay (HOSPITAL_BASED_OUTPATIENT_CLINIC_OR_DEPARTMENT_OTHER): Payer: Medicare Other | Admitting: Hematology and Oncology

## 2022-06-06 VITALS — BP 165/77 | HR 78 | Temp 97.8°F | Resp 18 | Ht 71.0 in | Wt 219.0 lb

## 2022-06-06 DIAGNOSIS — D638 Anemia in other chronic diseases classified elsewhere: Secondary | ICD-10-CM | POA: Diagnosis not present

## 2022-06-06 DIAGNOSIS — E663 Overweight: Secondary | ICD-10-CM | POA: Diagnosis not present

## 2022-06-06 DIAGNOSIS — C541 Malignant neoplasm of endometrium: Secondary | ICD-10-CM | POA: Diagnosis not present

## 2022-06-06 DIAGNOSIS — Z79899 Other long term (current) drug therapy: Secondary | ICD-10-CM | POA: Diagnosis not present

## 2022-06-06 DIAGNOSIS — E039 Hypothyroidism, unspecified: Secondary | ICD-10-CM | POA: Diagnosis not present

## 2022-06-06 DIAGNOSIS — C787 Secondary malignant neoplasm of liver and intrahepatic bile duct: Secondary | ICD-10-CM | POA: Diagnosis present

## 2022-06-06 DIAGNOSIS — M79672 Pain in left foot: Secondary | ICD-10-CM | POA: Diagnosis not present

## 2022-06-06 MED ORDER — LEVOTHYROXINE SODIUM 175 MCG PO TABS: 175.0000 ug | ORAL_TABLET | Freq: Every day | ORAL | 1 refills | Status: AC

## 2022-06-06 NOTE — Assessment & Plan Note (Signed)
She has mild intermittent anemia chronic illness Observed for now

## 2022-06-06 NOTE — Assessment & Plan Note (Signed)
She has gained weight due to inability to exercise, secondary to her left foot pain We discussed importance of dietary modification and weight loss strategies

## 2022-06-06 NOTE — Assessment & Plan Note (Signed)
I have reviewed multiple CT imaging dated back to 2020 She has stable CT imaging studies We have discontinued her treatment 6 months ago I recommend port removal I plan to space out her appointment to 9 months If her next scan is still normal, I will see her once a year

## 2022-06-06 NOTE — Progress Notes (Signed)
[place] OFFICE PROGRESS NOTE  Patient Care Team: [name], MD as PCP - General (Family Medicine)  ASSESSMENT & PLAN:  Recurrent carcinoma of endometrium [facility]) I have reviewed multiple CT imaging dated back to 2020 She has stable CT imaging studies We have discontinued her treatment 6 months ago I recommend port removal I plan to space out her appointment to 9 months If her next scan is still normal, I will see her once a year   Acquired hypothyroidism She has intermittent elevated TSH We will adjust the dose of her treatment accordingly I anticipate that she will need less thyroid replacement due to recent discontinuation of treatment Recent TSH was normal and I refill her prescription again She will get next TSH checked with her primary care doctor's visit early next year  Anemia, chronic disease She has mild intermittent anemia chronic illness Observed for now  Overweight (BMI 25.0-29.9) She has gained weight due to inability to exercise, secondary to her left foot pain We discussed importance of dietary modification and weight loss strategies  Orders Placed This Encounter  Procedures   IR REMOVAL TUN ACCESS W/ PORT W/O FL MOD SED    Standing Status:   Future    Standing Expiration Date:   06/07/2023    Order Specific Question:   Reason for exam:    Answer:   no need port    Order Specific Question:   Preferred Imaging Location?    Answer:   [facility]   CT ABDOMEN PELVIS W CONTRAST    Standing Status:   Future    Standing Expiration Date:   06/07/2023    Order Specific Question:   If indicated for the ordered procedure, I authorize the administration of contrast media per Radiology protocol    Answer:   Yes    Order Specific Question:   Preferred imaging location?    Answer:   [facility]    Order Specific Question:   Radiology Contrast Protocol - do NOT remove file path    Answer:    [email or web address]    All questions were answered. The patient knows to call the clinic with any problems, questions or concerns. The total time spent in the appointment was 30 minutes encounter with patients including review of chart and various tests results, discussions about plan of care and coordination of care plan   Heath Lark, MD 06/06/2022 12:31 PM  INTERVAL HISTORY: Please see below for problem oriented charting. she returns for surveillance follow-up Since last time I saw her, she has hurt her left foot and unable to exercise She is also getting serial injections to her left eye for macular degeneration Otherwise, she felt fine Denies abdominal pain, bloating or changes in bowel habits We spent a lot of time reviewing test results and discussed next step  REVIEW OF SYSTEMS:   Constitutional: Denies fevers, chills or abnormal weight loss Eyes: Denies blurriness of vision Ears, nose, mouth, throat, and face: Denies mucositis or sore throat Respiratory: Denies cough, dyspnea or wheezes Cardiovascular: Denies palpitation, chest discomfort or lower extremity swelling Gastrointestinal:  Denies nausea, heartburn or change in bowel habits Skin: Denies abnormal skin rashes Lymphatics: Denies new lymphadenopathy or easy bruising Neurological:Denies numbness, tingling or new weaknesses Behavioral/Psych: Mood is stable, no new changes  All other systems were reviewed with the patient and are negative.  I have reviewed the past medical history, past surgical history, social history and family history with the patient  and they are unchanged from previous note.  ALLERGIES:  is allergic to latex and lisinopril.  MEDICATIONS:  Current Outpatient Medications  Medication Sig Dispense Refill   acetaminophen (TYLENOL) 325 MG tablet Take 650 mg by mouth every 6 (six) hours as needed for mild pain or moderate pain.      amoxicillin (AMOXIL) 500 MG  capsule Take 2,000 mg by mouth See admin instructions. Prior to dental appointment, takes 4 tables prior to appt     Biotin w/ Vitamins C & E (HAIR/SKIN/NAILS PO) Take 1 tablet by mouth daily.     Calcium Carb-Cholecalciferol (CALCIUM 600 + D PO) Take 2 tablets by mouth daily.     diclofenac (VOLTAREN) 75 MG EC tablet Take 75 mg by mouth 2 (two) times daily as needed.     fluticasone (FLONASE) 50 MCG/ACT nasal spray Place 1 spray into both nostrils 2 (two) times daily as needed for allergies.   1   gabapentin (NEURONTIN) 600 MG tablet TAKE 1 TABLET BY MOUTH TWICE A DAY FOR RESTLESS LEGS AND NEUROPATHY. 60 tablet 11   levothyroxine (SYNTHROID) 175 MCG tablet Take 1 tablet (175 mcg total) by mouth daily before breakfast. 90 tablet 1   loratadine (CLARITIN) 10 MG tablet Take 10 mg by mouth daily.     LORazepam (ATIVAN) 1 MG tablet Take 1 tablet (1 mg total) by mouth every 8 (eight) hours as needed for anxiety or sleep. 90 tablet 0   losartan-hydrochlorothiazide (HYZAAR) 100-12.5 MG tablet Take 0.5 tablets by mouth daily. 60 tablet 9   magnesium oxide (MAG-OX) 400 MG tablet Take 1 tablet by mouth 2 (two) times daily.     meclizine (ANTIVERT) 25 MG tablet Take 25 mg by mouth 3 (three) times daily. Pt has vertigo  Take for 7 days     Melatonin 10 MG CAPS Take 10 mg by mouth at bedtime.     niacinamide 500 MG tablet Take 500 mg by mouth 3 (three) times daily with meals.     ondansetron (ZOFRAN) 8 MG tablet Take 1 tablet (8 mg total) by mouth every 8 (eight) hours as needed for nausea. 60 tablet 1   pramipexole (MIRAPEX) 0.5 MG tablet Take 1 mg by mouth at bedtime.     No current facility-administered medications for this visit.    SUMMARY OF ONCOLOGIC HISTORY: Oncology History Overview Note  MSI stable on June 2017 tissue but high from [facility] study from November 2017  05/15/16: ER is moderately positive (70%). PR is strongly positive (80%).   Recurrent carcinoma of endometrium ([place])   01/02/2016 Pathology Results   Uterus +/- tubes/ovaries, neoplastic, cervix ENDOMETRIAL ADENOCARCINOMA, FIGO GRADE 2 (4.7 CM) THE TUMOR INVADES LESS THAN ONE-HALF OF THE MYOMETRIUM (PT1A) ALL MARGINS OF RESECTION ARE NEGATIVE FOR CARCINOMA LEIOMYOMAS AND ADENOMYOSIS BILATERAL FALLOPIAN TUBES AND OVARIES: HISTOLOGICAL UNREMARKABLE 2. Lymph node, sentinel, biopsy, right obturator ONE BENIGN LYMPH NODE (0/1) 3. Lymph nodes, regional resection, left pelvic FOUR BENIGN LYMPH NODES (0/4)   01/02/2016 Surgery   Dr. Denman George performed robotic-assisted laparoscopic total hysterectomy with bilateral salpingoophorectomy, sentinel lymph node biopsy, lymphadenectomy      05/15/2016 Pathology Results   Vagina, biopsy, mid - ADENOCARCINOMA, SEE COMMENT. Microscopic Comment The morphology along with the patient's history are consistent with recurrent endometrioid adenocarcinoma. The carcinoma has a similar appearance to the primary ([id number]).   05/20/2016 Imaging   Ct scan abdomen showed solid 2.5 cm peritoneal mass in the mid to anterior left pelvis, suspicious for  peritoneal metastasis. 2. Small expansile low-attenuation filling defect in the left external iliac vein, cannot exclude a small deep venous thrombus. Consider correlation with left lower extremity venous Doppler scan. 3. Small simple fluid density structure in the left pelvic sidewall abutting the left external iliac vessels, favor a small postoperative seroma. 4. No ascites. 5. No lymphadenopathy.  No metastatic disease in the chest. 6. Aortic atherosclerosis.   06/12/2016 PET scan   Intensely hypermetabolic 2.1 cm central pelvic peritoneal mass just to the left of midline, consistent with peritoneal metastatic recurrence. No ascites. 2. No additional hypermetabolic sites of metastatic disease. 3. Diffuse thyroid hypermetabolism without discrete thyroid nodule, favoring thyroiditis. Recommend correlation with serum thyroid function tests.    06/24/2016 - 07/22/2016 Chemotherapy   The patient had weekly cisplatin. She has missed several doses due to infection and pancytopenia   06/24/2016 - 09/04/2016 Radiation Therapy   She completed concurrent radiation therapy Radiation treatment dates:   IMRT : 06/24/16 - 08/01/16 HDR : 08/13/16, 08/20/16, 08/27/16, 09/04/16   Site/dose:   Pelvis treated to 55 Gy in 25 fractions (simultaneous integrated boost technique) Vaginal Cuff treated to 24 Gy in 4 fractions   08/15/2016 - 12/03/2016 Chemotherapy   She received 6 cycles of carboplatin/Taxol   09/27/2016 Imaging   Interval decrease in size of previously described solid peritoneal nodule within the left anterior pelvis. Near complete resolution of previously described low-attenuation structure along the left pelvic sidewall. No evidence for metastatic disease in the chest. Aortic atherosclerosis.   12/30/2016 Imaging   Ct abdomen 1. Solitary left pelvic peritoneal implant is mildly decreased in size in the interval. 2. No new or progressive metastatic disease in the abdomen or pelvis. No ascites. 3. Aortic atherosclerosis.   04/02/2017 Imaging   Left lower quadrant peritoneal implant referenced on previous exam measures 1.7 x 1.3 cm, image 69 of series 2. Increased from 0.8 x 0.8 cm previously peer no new peritoneal implants identified.   Musculoskeletal: The degenerative disc disease noted within the lumbar spine.   IMPRESSION: 1. Solitary left pelvic peritoneal implant is increased in size in the interval. 2. No new sites of disease.  No ascites. 3. Aortic atherosclerosis   04/11/2017 PET scan   1. The left side of pelvis peritoneal implant has decreased in size and degree of FDG uptake compatible with response to therapy. No new areas of peritoneal disease identified. 2. Persistent diffuse increased uptake within the thyroid gland. Correlation with patient's thyroid function may be helpful.   05/26/2017 Imaging   1. Enlarging tumor  implant along the left adnexa, currently 2.6 by 2.4 cm and previously 1.9 by 1.3 cm. No new tumor implant or other specific cause for the patient's pelvic symptoms is currently identified. 2.  Aortic Atherosclerosis (ICD10-I70.0). 3. Lumbar spondylosis and degenerative disc disease causing multilevel impingement.   06/04/2017 - 08/28/2017 Chemotherapy   The patient started letrozole and everolimus   08/26/2017 Imaging   Increased size of mass in the left adnexal region.  Several new small liver metastases in right hepatic lobe   09/08/2017 Imaging   LV EF: 60% -   65%   09/10/2017 Procedure   Technically successful right IJ power-injectable port catheter placement. Ready for routine use   12/04/2017 Imaging   1. Interval increase in size and number of multiple lesions within the liver compatible with hepatic metastatic disease. 2. Interval increase in size mass within the left hemipelvis.   12/15/2017 -  Chemotherapy   The patient  had pembrolizumab (KEYTRUDA) 200 mg in sodium chloride 0.9 % 50 mL chemo infusion, 200 mg, Intravenous, Once, 1 of 6 cycles Administration: 200 mg (12/15/2017)  for chemotherapy treatment.    12/21/2017 Imaging   1. Moderate left hydronephrosis and hydroureter, similar compared to most recent CT from May 2019; obstruction appears to be secondary to a left pelvic mass/metastatic focus which has increased in size since the prior CT. 2. Numerous hepatic metastatic lesions, suspect that some may be increased in size but difficult to further characterize without intravenous contrast. Increased size of left pelvic soft tissue mass/metastatic lesion. Mass abuts and possibly invades the sigmoid colon.     02/20/2018 Imaging   Interval decrease in hepatic metastases.  Decreased mass or lymphadenopathy in the left external iliac chain. Interval resolution of left hydroureteronephrosis.  No new or progressive disease within the abdomen or pelvis.   05/16/2018 Imaging    05/16/2018 CT Abdomen IMPRESSION: 1. Slight interval decrease in size of hepatic metastatic disease. 2. Slight interval decrease in size centrally necrotic mass within the left external iliac region.   09/18/2018 Imaging   1. Today's study demonstrates a mixed response to therapy. Specifically, while the previously noted hepatic metastases appear decreased, the soft tissue mass along the left pelvic sidewall appears slightly larger than the prior study. No new metastatic disease is noted elsewhere in the abdomen or pelvis. 2. Aortic atherosclerosis. 3. Mild cardiomegaly. 4. Additional incidental findings, as above     03/12/2019 Imaging   1. Overall improvement with reduced size of the hepatic metastatic lesions, and reduced size of the irregular soft tissue mass in the left pelvis. 2. Other imaging findings of potential clinical significance: Mild distal esophageal wall thickening, cannot exclude esophagitis. Aortic Atherosclerosis (ICD10-I70.0). Lumbar impingement at L5-S1.     09/09/2019 Imaging   1. Stable size and appearance of the somewhat irregular left adnexal mass, currently 2.8 by 1.9 cm. 2. The scattered hepatic metastatic lesions are stable from 03/12/2019, and represent effectively treated metastatic lesions. 3. Other imaging findings of potential clinical significance: Airway thickening is present, suggesting bronchitis or reactive airways disease. Lumbar spondylosis and degenerative disc disease causing impingement at L4-5 and L5-S1. Small umbilical hernia contains adipose tissue.   Aortic Atherosclerosis (ICD10-I70.0).   03/07/2020 Imaging   1. Stable appearance of LEFT pelvic mass without new signs of disease. Mass remains closely approximated to the colon, in the vicinity of the distal LEFT ureter and the iliac vessels. 2. Stable signs of hepatic metastatic disease with calcified lesion at the dome of the liver and subtle areas of low attenuation elsewhere which show no  change.   09/12/2020 Imaging   1. Stable appearance of the partially calcified LEFT pelvic sidewall soft tissue mass with unchanged involvement of the sigmoid colon, iliac vessels and close proximity to the left ureter. No new signs of disease. 2. Stable signs of the hepatic metastatic disease with calcified lesion at the hepatic dome and subcentimeter area of low attenuation in the right hepatic lobe. 3. No evidence of new or enlarging abdominopelvic metastatic disease. 4. Aortic atherosclerosis.   03/15/2021 Imaging   1. Stable exam. No new or progressive interval findings. 2. Spiculated calcified soft tissue lesion along the left pelvic sidewall is stable in the interval. This lesion tethers the sigmoid colon and is in close proximity to the left ureter and left external iliac vasculature. 3. Stable appearance of the calcified lesion at the dome of the liver and hypoattenuating lesion in the  right liver. 4. Small umbilical hernia contains only fat.   01/03/2022 Imaging   1. Residua from prior metastatic lesions in the liver and left pelvic sidewall without progression or new lesion identified. Appearance most compatible with effectively treated malignancy. 2. The left adnexal/pelvic sidewall lesion abuts the margin of the sigmoid colon, as before. 3. Other imaging findings of potential clinical significance: Aortic Atherosclerosis (ICD10-I70.0). Mild mitral valve calcification. Lower lumbar impingement. Small umbilical hernia contains adipose tissue.   06/05/2022 Imaging   1. No findings of active malignancy. Stable chronic residua from previous effective treated metastatic disease in the liver and along the left adnexa. 2. Lumbar impingement at L4-5 and L5-S1. 3. Aortic atherosclerosis.   Aortic Atherosclerosis (ICD10-I70.0).     PHYSICAL EXAMINATION: ECOG PERFORMANCE STATUS: 2 - Symptomatic, <50% confined to bed  Vitals:   06/06/22 1134  BP: (!) 165/77  Pulse: 78  Resp: 18  Temp:  97.8 F (36.6 C)  SpO2: 100%   Filed Weights   06/06/22 1134  Weight: 219 lb (99.3 kg)    GENERAL:alert, no distress and comfortable NEURO: alert & oriented x 3 with fluent speech, no focal motor/sensory deficits  LABORATORY DATA:  I have reviewed the data as listed    Component Value Date/Time   NA 136 06/04/2022 1046   NA 141 07/09/2017 1312   K 3.5 06/04/2022 1046   K 3.9 07/09/2017 1312   CL 103 06/04/2022 1046   CO2 29 06/04/2022 1046   CO2 27 07/09/2017 1312   GLUCOSE 89 06/04/2022 1046   GLUCOSE 109 07/09/2017 1312   BUN 27 (H) 06/04/2022 1046   BUN 16.4 07/09/2017 1312   CREATININE 0.78 06/04/2022 1046   CREATININE 0.66 01/25/2021 1149   CREATININE 0.8 07/09/2017 1312   CALCIUM 9.4 06/04/2022 1046   CALCIUM 9.2 07/09/2017 1312   PROT 7.4 06/04/2022 1046   PROT 7.1 07/09/2017 1312   ALBUMIN 4.0 06/04/2022 1046   ALBUMIN 3.1 (L) 07/09/2017 1312   AST 17 06/04/2022 1046   AST 21 01/25/2021 1149   AST 35 (H) 07/09/2017 1312   ALT 15 06/04/2022 1046   ALT 20 01/25/2021 1149   ALT 43 07/09/2017 1312   ALKPHOS 121 06/04/2022 1046   ALKPHOS 182 (H) 07/09/2017 1312   BILITOT 0.6 06/04/2022 1046   BILITOT 0.6 01/25/2021 1149   BILITOT 0.34 07/09/2017 1312   GFRNONAA >60 06/04/2022 1046   GFRNONAA >60 01/25/2021 1149   GFRAA >60 03/29/2020 1105   GFRAA >60 01/04/2019 1351    No results found for: "SPEP", "UPEP"  Lab Results  Component Value Date   WBC 6.4 06/04/2022   NEUTROABS 4.1 06/04/2022   HGB 11.9 (L) 06/04/2022   HCT 36.7 06/04/2022   MCV 79.8 (L) 06/04/2022   PLT 205 06/04/2022      Chemistry      Component Value Date/Time   NA 136 06/04/2022 1046   NA 141 07/09/2017 1312   K 3.5 06/04/2022 1046   K 3.9 07/09/2017 1312   CL 103 06/04/2022 1046   CO2 29 06/04/2022 1046   CO2 27 07/09/2017 1312   BUN 27 (H) 06/04/2022 1046   BUN 16.4 07/09/2017 1312   CREATININE 0.78 06/04/2022 1046   CREATININE 0.66 01/25/2021 1149   CREATININE 0.8  07/09/2017 1312      Component Value Date/Time   CALCIUM 9.4 06/04/2022 1046   CALCIUM 9.2 07/09/2017 1312   ALKPHOS 121 06/04/2022 1046   ALKPHOS 182 (H) 07/09/2017  1312   AST 17 06/04/2022 1046   AST 21 01/25/2021 1149   AST 35 (H) 07/09/2017 1312   ALT 15 06/04/2022 1046   ALT 20 01/25/2021 1149   ALT 43 07/09/2017 1312   BILITOT 0.6 06/04/2022 1046   BILITOT 0.6 01/25/2021 1149   BILITOT 0.34 07/09/2017 1312       RADIOGRAPHIC STUDIES: I have reviewed multiple imaging studies with the patient I have personally reviewed the radiological images as listed and agreed with the findings in the report. CT ABDOMEN PELVIS W CONTRAST  Result Date: 06/05/2022 CLINICAL DATA:  Cervical cancer restaging * Tracking Code: BO * EXAM: CT ABDOMEN AND PELVIS WITH CONTRAST TECHNIQUE: Multidetector CT imaging of the abdomen and pelvis was performed using the standard protocol following bolus administration of intravenous contrast. RADIATION DOSE REDUCTION: This exam was performed according to the departmental dose-optimization program which includes automated exposure control, adjustment of the mA and/or kV according to patient size and/or use of iterative reconstruction technique. CONTRAST:  188m OMNIPAQUE IOHEXOL 300 MG/ML  SOLN COMPARISON:  Multiple exams, including 01/01/2022 FINDINGS: Lower chest: Mild mitral valve calcification is again noted. Minimal lingular scarring. Hepatobiliary: 6 mm in long axis calcification in the dome of the right hepatic lobe, image 9 series 2. Small calcified lesion measuring 0.8 cm in long axis in the right hepatic lobe adjacent to the IVC on image 19 series 2, stable. These are compatible with previously effectively treated metastatic disease. 7 by 6 mm hypodense lesion in the right hepatic lobe on image 14 series 2, chronically stable and likely benign. Gallbladder unremarkable.  No biliary dilatation. Pancreas: Unremarkable Spleen: Unremarkable Adrenals/Urinary Tract:  Unremarkable Stomach/Bowel: Small periampullary duodenal diverticulum. No significant abnormality observed. Vascular/Lymphatic: Mild abdominal aortic atherosclerotic vascular disease. No current pathologic adenopathy. Reproductive: There is absent. Chronic architectural distortion and scarring along the left adnexal region corresponding to a site of prior metastatic disease, this again demonstrates punctate internal calcification and stellate margins on image 65 of series 2. Morphologically unchanged from the prior exam, not including the stellate margins the overall soft tissue component measures about 1.8 by 1.6 cm on image 65 of series 2. This is not substantially changed from 03/14/2021 and probably represents residuum from effectively treated metastatic lesion. This does abut the margin of the sigmoid colon, as before. Other: No supplemental non-categorized findings. Musculoskeletal: Small umbilical hernia contains adipose tissue. Lumbar spondylosis and degenerative disc disease at all levels between L2 and S1 contributing to impingement at the L4-5 and L5-S1 levels. IMPRESSION: 1. No findings of active malignancy. Stable chronic residua from previous effective treated metastatic disease in the liver and along the left adnexa. 2. Lumbar impingement at L4-5 and L5-S1. 3. Aortic atherosclerosis. Aortic Atherosclerosis (ICD10-I70.0). Electronically Signed   By: WVan ClinesM.D.   On: 06/05/2022 12:37

## 2022-06-06 NOTE — Assessment & Plan Note (Signed)
She has intermittent elevated TSH We will adjust the dose of her treatment accordingly I anticipate that she will need less thyroid replacement due to recent discontinuation of treatment Recent TSH was normal and I refill her prescription again She will get next TSH checked with her primary care doctor's visit early next year

## 2022-06-10 ENCOUNTER — Telehealth: Payer: Self-pay | Admitting: Hematology and Oncology

## 2022-06-10 NOTE — Telephone Encounter (Signed)
Scheduled appointment per 11/30 los. Patient is aware.

## 2022-06-18 ENCOUNTER — Telehealth: Payer: Self-pay

## 2022-06-18 NOTE — Telephone Encounter (Signed)
Called Pt and discussed that EMLA cream is not needed for port removal appt and a gown will be provided for accessibility. Pt instructed to arrive to appt at 1500. Pt verbalized understanding with no further questions.

## 2022-06-19 ENCOUNTER — Ambulatory Visit (HOSPITAL_COMMUNITY)
Admission: RE | Admit: 2022-06-19 | Discharge: 2022-06-19 | Disposition: A | Discharge status: Home / Self Care | Payer: Medicare Other | Source: Ambulatory Visit | Attending: Hematology and Oncology | Admitting: Hematology and Oncology

## 2022-06-19 DIAGNOSIS — Z452 Encounter for adjustment and management of vascular access device: Secondary | ICD-10-CM | POA: Diagnosis not present

## 2022-06-19 DIAGNOSIS — C541 Malignant neoplasm of endometrium: Secondary | ICD-10-CM | POA: Diagnosis not present

## 2022-06-19 HISTORY — PX: IR REMOVAL TUN ACCESS W/ PORT W/O FL MOD SED: IMG2290

## 2022-06-19 MED ORDER — LIDOCAINE-EPINEPHRINE 1 %-1:100000 IJ SOLN
INTRAMUSCULAR | Status: AC
  Administered 2022-06-19: 10 mL
  Filled 2022-06-19: qty 1

## 2022-06-21 ENCOUNTER — Telehealth: Payer: Self-pay

## 2022-06-21 NOTE — Telephone Encounter (Signed)
Returned her call. She had port removed on 12/13 and it having itching at port site at the glue area. Denies rash. Instructed to apply ice pack to site and take po benadryl prn. She verbalized understanding. Called IR and reported above. They will have someone call Michelle Rosario to follow up.

## 2022-07-23 ENCOUNTER — Other Ambulatory Visit (HOSPITAL_COMMUNITY): Payer: Medicare Other

## 2022-12-30 ENCOUNTER — Telehealth: Payer: Self-pay

## 2022-12-30 NOTE — Telephone Encounter (Signed)
Called and left a message lab appt has been moved to 8/29 at 1045, then she can go to radiology for 1200 CT scan. Ask her to call the office back for questions.

## 2022-12-30 NOTE — Telephone Encounter (Signed)
-----   Message from Artis Delay, MD sent at 12/29/2022  2:54 PM EDT ----- Pls move lab appt to be done before her CT

## 2023-01-29 ENCOUNTER — Telehealth: Payer: Self-pay

## 2023-01-29 NOTE — Telephone Encounter (Signed)
Verified upcoming appointments with patient. Patient confirmed upcoming lab appointment prior to CT scan on 8/29. Patient verbalized an understanding of appointment dates and times and that she will need to be NPO 4 hours prior to the scan at 12:00 PM. Patient aware of follow-up appointment with Dr. Bertis Ruddy on 9/5 to review CT scan results.  All questions answered during phone call.

## 2023-02-24 ENCOUNTER — Telehealth: Payer: Self-pay

## 2023-02-24 NOTE — Telephone Encounter (Signed)
Called back and reviewed upcoming appts. She verbalized understanding.

## 2023-03-03 ENCOUNTER — Telehealth: Payer: Self-pay

## 2023-03-03 NOTE — Telephone Encounter (Signed)
Returned her call and reviewed 8/29 appts. She verbalized understanding.

## 2023-03-06 ENCOUNTER — Inpatient Hospital Stay: Payer: Medicare Other | Attending: Hematology and Oncology

## 2023-03-06 ENCOUNTER — Ambulatory Visit (HOSPITAL_COMMUNITY)
Admission: RE | Admit: 2023-03-06 | Discharge: 2023-03-06 | Disposition: A | Discharge status: Home / Self Care | Payer: Medicare Other | Source: Ambulatory Visit | Attending: Hematology and Oncology | Admitting: Hematology and Oncology

## 2023-03-06 ENCOUNTER — Inpatient Hospital Stay: Payer: Medicare Other

## 2023-03-06 ENCOUNTER — Other Ambulatory Visit: Payer: Self-pay | Admitting: *Deleted

## 2023-03-06 ENCOUNTER — Encounter (HOSPITAL_COMMUNITY): Payer: Self-pay

## 2023-03-06 DIAGNOSIS — Z9079 Acquired absence of other genital organ(s): Secondary | ICD-10-CM | POA: Insufficient documentation

## 2023-03-06 DIAGNOSIS — C541 Malignant neoplasm of endometrium: Secondary | ICD-10-CM

## 2023-03-06 DIAGNOSIS — Z79899 Other long term (current) drug therapy: Secondary | ICD-10-CM | POA: Diagnosis not present

## 2023-03-06 DIAGNOSIS — Z8542 Personal history of malignant neoplasm of other parts of uterus: Secondary | ICD-10-CM | POA: Diagnosis present

## 2023-03-06 DIAGNOSIS — C787 Secondary malignant neoplasm of liver and intrahepatic bile duct: Secondary | ICD-10-CM

## 2023-03-06 DIAGNOSIS — Z90710 Acquired absence of both cervix and uterus: Secondary | ICD-10-CM | POA: Diagnosis not present

## 2023-03-06 DIAGNOSIS — Z90722 Acquired absence of ovaries, bilateral: Secondary | ICD-10-CM | POA: Diagnosis not present

## 2023-03-06 DIAGNOSIS — E039 Hypothyroidism, unspecified: Secondary | ICD-10-CM

## 2023-03-06 LAB — CMP (CANCER CENTER ONLY)
ALT: 17 U/L (ref 0–44)
AST: 15 U/L (ref 15–41)
Albumin: 4 g/dL (ref 3.5–5.0)
Alkaline Phosphatase: 98 U/L (ref 38–126)
Anion gap: 4 — ABNORMAL LOW (ref 5–15)
BUN: 31 mg/dL — ABNORMAL HIGH (ref 8–23)
CO2: 31 mmol/L (ref 22–32)
Calcium: 9.8 mg/dL (ref 8.9–10.3)
Chloride: 104 mmol/L (ref 98–111)
Creatinine: 0.84 mg/dL (ref 0.44–1.00)
GFR, Estimated: >60 mL/min (ref >60)
Glucose, Bld: 84 mg/dL (ref 70–99)
Potassium: 4 mmol/L (ref 3.5–5.1)
Sodium: 139 mmol/L (ref 135–145)
Total Bilirubin: 0.5 mg/dL (ref 0.3–1.2)
Total Protein: 7.1 g/dL (ref 6.5–8.1)

## 2023-03-06 LAB — CBC WITH DIFFERENTIAL/PLATELET
Abs Immature Granulocytes: 0.01 10*3/uL (ref 0.00–0.07)
Basophils Absolute: 0.1 10*3/uL (ref 0.0–0.1)
Basophils Relative: 1 %
Eosinophils Absolute: 0.2 10*3/uL (ref 0.0–0.5)
Eosinophils Relative: 2 %
HCT: 39 % (ref 36.0–46.0)
Hemoglobin: 12.7 g/dL (ref 12.0–15.0)
Immature Granulocytes: 0 %
Lymphocytes Relative: 19 %
Lymphs Abs: 1.3 10*3/uL (ref 0.7–4.0)
MCH: 27.7 pg (ref 26.0–34.0)
MCHC: 32.6 g/dL (ref 30.0–36.0)
MCV: 85.2 fL (ref 80.0–100.0)
Monocytes Absolute: 0.5 10*3/uL (ref 0.1–1.0)
Monocytes Relative: 6 %
Neutro Abs: 5 10*3/uL (ref 1.7–7.7)
Neutrophils Relative %: 72 %
Platelets: 186 10*3/uL (ref 150–400)
RBC: 4.58 MIL/uL (ref 3.87–5.11)
RDW: 13.7 % (ref 11.5–15.5)
WBC: 7 10*3/uL (ref 4.0–10.5)
nRBC: 0 % (ref 0.0–0.2)

## 2023-03-06 LAB — TSH: TSH: 0.733 u[IU]/mL (ref 0.350–4.500)

## 2023-03-06 LAB — T4, FREE: Free T4: 1.68 ng/dL — ABNORMAL HIGH (ref 0.61–1.12)

## 2023-03-06 MED ORDER — SODIUM CHLORIDE (PF) 0.9 % IJ SOLN
INTRAMUSCULAR | Status: AC
  Filled 2023-03-06: qty 50

## 2023-03-06 MED ORDER — IOHEXOL 300 MG/ML  SOLN
100.0000 mL | Freq: Once | INTRAMUSCULAR | Status: AC | PRN
  Administered 2023-03-06: 100 mL via INTRAVENOUS

## 2023-03-11 ENCOUNTER — Other Ambulatory Visit: Payer: Medicare Other

## 2023-03-13 ENCOUNTER — Inpatient Hospital Stay: Payer: Medicare Other | Attending: Hematology and Oncology | Admitting: Hematology and Oncology

## 2023-03-13 ENCOUNTER — Encounter: Payer: Self-pay | Admitting: Hematology and Oncology

## 2023-03-13 VITALS — BP 147/75 | HR 73 | Temp 97.4°F | Resp 18 | Ht 71.0 in | Wt 210.6 lb

## 2023-03-13 DIAGNOSIS — E039 Hypothyroidism, unspecified: Secondary | ICD-10-CM | POA: Diagnosis not present

## 2023-03-13 DIAGNOSIS — Z9221 Personal history of antineoplastic chemotherapy: Secondary | ICD-10-CM | POA: Diagnosis not present

## 2023-03-13 DIAGNOSIS — C541 Malignant neoplasm of endometrium: Secondary | ICD-10-CM | POA: Diagnosis not present

## 2023-03-13 DIAGNOSIS — Z923 Personal history of irradiation: Secondary | ICD-10-CM | POA: Insufficient documentation

## 2023-03-13 DIAGNOSIS — M1990 Unspecified osteoarthritis, unspecified site: Secondary | ICD-10-CM

## 2023-03-13 DIAGNOSIS — E663 Overweight: Secondary | ICD-10-CM | POA: Diagnosis not present

## 2023-03-13 DIAGNOSIS — Z8542 Personal history of malignant neoplasm of other parts of uterus: Secondary | ICD-10-CM | POA: Insufficient documentation

## 2023-03-13 NOTE — Assessment & Plan Note (Signed)
She has stable CT imaging studies We have discontinued her treatment more than a year ago I plan to see her again in 9 months with repeat imaging study

## 2023-03-13 NOTE — Assessment & Plan Note (Signed)
She is debilitated with osteoarthritis I recommend referral to physical therapy I recommend she reach out to her local physical therapist to fax me order so that I can send referral

## 2023-03-13 NOTE — Progress Notes (Signed)
[facility] OFFICE PROGRESS NOTE  Patient Care Team: Barbie Banner, MD as PCP - General (Family Medicine)  ASSESSMENT & PLAN:  Recurrent carcinoma of endometrium [facility]) She has stable CT imaging studies We have discontinued her treatment more than a year ago I plan to see her again in 9 months with repeat imaging study   Acquired hypothyroidism Recent TSH is stable Monitor closely  Overweight (BMI 25.0-29.9) She has significant debility with back pain and difficulty with mobility I recommend physical therapy and rehab We discussed importance of dietary modification and weight loss strategies  Osteoarthritis She is debilitated with osteoarthritis I recommend referral to physical therapy I recommend she reach out to her local physical therapist to fax me order so that I can send referral  Orders Placed This Encounter  Procedures   CT ABDOMEN PELVIS W CONTRAST    Standing Status:   Future    Standing Expiration Date:   03/12/2024    Order Specific Question:   If indicated for the ordered procedure, I authorize the administration of contrast media per Radiology protocol    Answer:   Yes    Order Specific Question:   Does the patient have a contrast media/X-ray dye allergy?    Answer:   No    Order Specific Question:   Preferred imaging location?    Answer:   [facility]    Order Specific Question:   If indicated for the ordered procedure, I authorize the administration of oral contrast media per Radiology protocol    Answer:   No    Order Specific Question:   Reason for no oral contrast:    Answer:   no need oral contrast   Comprehensive metabolic panel    Standing Status:   Standing    Number of Occurrences:   33    Standing Expiration Date:   03/12/2024    All questions were answered. The patient knows to call the clinic with any problems, questions or concerns. The total time spent in the appointment was 30 minutes encounter with patients including  review of chart and various tests results, discussions about plan of care and coordination of care plan   Artis Delay, MD 03/13/2023 1:41 PM  INTERVAL HISTORY: Please see below for problem oriented charting. 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 We discontinued treatment more than a year ago Recent imaging study showed no evidence of disease progression She is quite debilitated due to her poor vision and osteoarthritis limiting her mobility Thankfully, she has not gained weight since last time I saw her We discussed and reviewed test results today and discussed future plan of care  REVIEW OF SYSTEMS:   Constitutional: Denies fevers, chills or abnormal weight loss Ears, nose, mouth, throat, and face: Denies mucositis or sore throat Respiratory: Denies cough, dyspnea or wheezes Cardiovascular: Denies palpitation, chest discomfort or lower extremity swelling Gastrointestinal:  Denies nausea, heartburn or change in bowel habits Skin: Denies abnormal skin rashes Lymphatics: Denies new lymphadenopathy or easy bruising Behavioral/Psych: Mood is stable, no new changes  All other systems were reviewed with the patient and are negative.  I have reviewed the past medical history, past surgical history, social history and family history with the patient and they are unchanged from previous note.  ALLERGIES:  is allergic to latex and lisinopril.  MEDICATIONS:  Current Outpatient Medications  Medication Sig Dispense Refill   FLUoxetine (PROZAC) 10 MG capsule Take 10 mg  by mouth daily.     traZODone (DESYREL) 100 MG tablet Take 50 mg by mouth at bedtime.     acetaminophen (TYLENOL) 325 MG tablet Take 650 mg by mouth every 6 (six) hours as needed for mild pain or moderate pain.      amoxicillin (AMOXIL) 500 MG capsule Take 2,000 mg by mouth See admin instructions. Prior to dental appointment, takes 4 tables prior to appt     Biotin w/ Vitamins C & E  (HAIR/SKIN/NAILS PO) Take 1 tablet by mouth daily.     Calcium Carb-Cholecalciferol (CALCIUM 600 + D PO) Take 2 tablets by mouth daily.     diclofenac (VOLTAREN) 75 MG EC tablet Take 75 mg by mouth 2 (two) times daily as needed.     fluticasone (FLONASE) 50 MCG/ACT nasal spray Place 1 spray into both nostrils 2 (two) times daily as needed for allergies.   1   gabapentin (NEURONTIN) 600 MG tablet TAKE 1 TABLET BY MOUTH TWICE A DAY FOR RESTLESS LEGS AND NEUROPATHY. 60 tablet 11   levothyroxine (SYNTHROID) 175 MCG tablet Take 1 tablet (175 mcg total) by mouth daily before breakfast. 90 tablet 1   loratadine (CLARITIN) 10 MG tablet Take 10 mg by mouth daily.     LORazepam (ATIVAN) 1 MG tablet Take 1 tablet (1 mg total) by mouth every 8 (eight) hours as needed for anxiety or sleep. 90 tablet 0   losartan-hydrochlorothiazide (HYZAAR) 100-12.5 MG tablet Take 0.5 tablets by mouth daily. 60 tablet 9   magnesium oxide (MAG-OX) 400 MG tablet Take 1 tablet by mouth 2 (two) times daily.     meclizine (ANTIVERT) 25 MG tablet Take 25 mg by mouth 3 (three) times daily. Pt has vertigo  Take for 7 days     niacinamide 500 MG tablet Take 500 mg by mouth 3 (three) times daily with meals.     pramipexole (MIRAPEX) 0.5 MG tablet Take 1 mg by mouth at bedtime.     No current facility-administered medications for this visit.    SUMMARY OF ONCOLOGIC HISTORY: Oncology History Overview Note  MSI stable on June 2017 tissue but high from [facility] One study from November 2017  05/15/16: ER is moderately positive (70%). PR is strongly positive (80%).   Recurrent carcinoma of endometrium (HCC)  01/02/2016 Pathology Results   Uterus +/- tubes/ovaries, neoplastic, cervix ENDOMETRIAL ADENOCARCINOMA, FIGO GRADE 2 (4.7 CM) THE TUMOR INVADES LESS THAN ONE-HALF OF THE MYOMETRIUM (PT1A) ALL MARGINS OF RESECTION ARE NEGATIVE FOR CARCINOMA LEIOMYOMAS AND ADENOMYOSIS BILATERAL FALLOPIAN TUBES AND OVARIES: HISTOLOGICAL  UNREMARKABLE 2. Lymph node, sentinel, biopsy, right obturator ONE BENIGN LYMPH NODE (0/1) 3. Lymph nodes, regional resection, left pelvic FOUR BENIGN LYMPH NODES (0/4)   01/02/2016 Surgery   Dr. Andrey Farmer performed robotic-assisted laparoscopic total hysterectomy with bilateral salpingoophorectomy, sentinel lymph node biopsy, lymphadenectomy      05/15/2016 Pathology Results   Vagina, biopsy, mid - ADENOCARCINOMA, SEE COMMENT. Microscopic Comment The morphology along with the patient's history are consistent with recurrent endometrioid adenocarcinoma. The carcinoma has a similar appearance to the primary ([id number]).   05/20/2016 Imaging   Ct scan abdomen showed solid 2.5 cm peritoneal mass in the mid to anterior left pelvis, suspicious for peritoneal metastasis. 2. Small expansile low-attenuation filling defect in the left external iliac vein, cannot exclude a small deep venous thrombus. Consider correlation with left lower extremity venous Doppler scan. 3. Small simple fluid density structure in the left pelvic sidewall abutting the left external iliac vessels,  favor a small postoperative seroma. 4. No ascites. 5. No lymphadenopathy.  No metastatic disease in the chest. 6. Aortic atherosclerosis.   06/12/2016 PET scan   Intensely hypermetabolic 2.1 cm central pelvic peritoneal mass just to the left of midline, consistent with peritoneal metastatic recurrence. No ascites. 2. No additional hypermetabolic sites of metastatic disease. 3. Diffuse thyroid hypermetabolism without discrete thyroid nodule, favoring thyroiditis. Recommend correlation with serum thyroid function tests.   06/24/2016 - 07/22/2016 Chemotherapy   The patient had weekly cisplatin. She has missed several doses due to infection and pancytopenia   06/24/2016 - 09/04/2016 Radiation Therapy   She completed concurrent radiation therapy Radiation treatment dates:   IMRT : 06/24/16 - 08/01/16 HDR : 08/13/16, 08/20/16, 08/27/16, 09/04/16    Site/dose:   Pelvis treated to 55 Gy in 25 fractions (simultaneous integrated boost technique) Vaginal Cuff treated to 24 Gy in 4 fractions   08/15/2016 - 12/03/2016 Chemotherapy   She received 6 cycles of carboplatin/Taxol   09/27/2016 Imaging   Interval decrease in size of previously described solid peritoneal nodule within the left anterior pelvis. Near complete resolution of previously described low-attenuation structure along the left pelvic sidewall. No evidence for metastatic disease in the chest. Aortic atherosclerosis.   12/30/2016 Imaging   Ct abdomen 1. Solitary left pelvic peritoneal implant is mildly decreased in size in the interval. 2. No new or progressive metastatic disease in the abdomen or pelvis. No ascites. 3. Aortic atherosclerosis.   04/02/2017 Imaging   Left lower quadrant peritoneal implant referenced on previous exam measures 1.7 x 1.3 cm, image 69 of series 2. Increased from 0.8 x 0.8 cm previously peer no new peritoneal implants identified.   Musculoskeletal: The degenerative disc disease noted within the lumbar spine.   IMPRESSION: 1. Solitary left pelvic peritoneal implant is increased in size in the interval. 2. No new sites of disease.  No ascites. 3. Aortic atherosclerosis   04/11/2017 PET scan   1. The left side of pelvis peritoneal implant has decreased in size and degree of FDG uptake compatible with response to therapy. No new areas of peritoneal disease identified. 2. Persistent diffuse increased uptake within the thyroid gland. Correlation with patient's thyroid function may be helpful.   05/26/2017 Imaging   1. Enlarging tumor implant along the left adnexa, currently 2.6 by 2.4 cm and previously 1.9 by 1.3 cm. No new tumor implant or other specific cause for the patient's pelvic symptoms is currently identified. 2.  Aortic Atherosclerosis (ICD10-I70.0). 3. Lumbar spondylosis and degenerative disc disease causing multilevel impingement.    06/04/2017 - 08/28/2017 Chemotherapy   The patient started letrozole and everolimus   08/26/2017 Imaging   Increased size of mass in the left adnexal region.  Several new small liver metastases in right hepatic lobe   09/08/2017 Imaging   LV EF: 60% -   65%   09/10/2017 Procedure   Technically successful right IJ power-injectable port catheter placement. Ready for routine use   12/04/2017 Imaging   1. Interval increase in size and number of multiple lesions within the liver compatible with hepatic metastatic disease. 2. Interval increase in size mass within the left hemipelvis.   12/15/2017 -  Chemotherapy   The patient had pembrolizumab (KEYTRUDA) 200 mg in sodium chloride 0.9 % 50 mL chemo infusion, 200 mg, Intravenous, Once, 1 of 6 cycles Administration: 200 mg (12/15/2017)  for chemotherapy treatment.    12/21/2017 Imaging   1. Moderate left hydronephrosis and hydroureter, similar compared to most  recent CT from May 2019; obstruction appears to be secondary to a left pelvic mass/metastatic focus which has increased in size since the prior CT. 2. Numerous hepatic metastatic lesions, suspect that some may be increased in size but difficult to further characterize without intravenous contrast. Increased size of left pelvic soft tissue mass/metastatic lesion. Mass abuts and possibly invades the sigmoid colon.     02/20/2018 Imaging   Interval decrease in hepatic metastases.  Decreased mass or lymphadenopathy in the left external iliac chain. Interval resolution of left hydroureteronephrosis.  No new or progressive disease within the abdomen or pelvis.   05/16/2018 Imaging   05/16/2018 CT Abdomen IMPRESSION: 1. Slight interval decrease in size of hepatic metastatic disease. 2. Slight interval decrease in size centrally necrotic mass within the left external iliac region.   09/18/2018 Imaging   1. Today's study demonstrates a mixed response to therapy. Specifically, while the previously  noted hepatic metastases appear decreased, the soft tissue mass along the left pelvic sidewall appears slightly larger than the prior study. No new metastatic disease is noted elsewhere in the abdomen or pelvis. 2. Aortic atherosclerosis. 3. Mild cardiomegaly. 4. Additional incidental findings, as above     03/12/2019 Imaging   1. Overall improvement with reduced size of the hepatic metastatic lesions, and reduced size of the irregular soft tissue mass in the left pelvis. 2. Other imaging findings of potential clinical significance: Mild distal esophageal wall thickening, cannot exclude esophagitis. Aortic Atherosclerosis (ICD10-I70.0). Lumbar impingement at L5-S1.     09/09/2019 Imaging   1. Stable size and appearance of the somewhat irregular left adnexal mass, currently 2.8 by 1.9 cm. 2. The scattered hepatic metastatic lesions are stable from 03/12/2019, and represent effectively treated metastatic lesions. 3. Other imaging findings of potential clinical significance: Airway thickening is present, suggesting bronchitis or reactive airways disease. Lumbar spondylosis and degenerative disc disease causing impingement at L4-5 and L5-S1. Small umbilical hernia contains adipose tissue.   Aortic Atherosclerosis (ICD10-I70.0).   03/07/2020 Imaging   1. Stable appearance of LEFT pelvic mass without new signs of disease. Mass remains closely approximated to the colon, in the vicinity of the distal LEFT ureter and the iliac vessels. 2. Stable signs of hepatic metastatic disease with calcified lesion at the dome of the liver and subtle areas of low attenuation elsewhere which show no change.   09/12/2020 Imaging   1. Stable appearance of the partially calcified LEFT pelvic sidewall soft tissue mass with unchanged involvement of the sigmoid colon, iliac vessels and close proximity to the left ureter. No new signs of disease. 2. Stable signs of the hepatic metastatic disease with calcified lesion at the  hepatic dome and subcentimeter area of low attenuation in the right hepatic lobe. 3. No evidence of new or enlarging abdominopelvic metastatic disease. 4. Aortic atherosclerosis.   03/15/2021 Imaging   1. Stable exam. No new or progressive interval findings. 2. Spiculated calcified soft tissue lesion along the left pelvic sidewall is stable in the interval. This lesion tethers the sigmoid colon and is in close proximity to the left ureter and left external iliac vasculature. 3. Stable appearance of the calcified lesion at the dome of the liver and hypoattenuating lesion in the right liver. 4. Small umbilical hernia contains only fat.   01/03/2022 Imaging   1. Residua from prior metastatic lesions in the liver and left pelvic sidewall without progression or new lesion identified. Appearance most compatible with effectively treated malignancy. 2. The left adnexal/pelvic sidewall lesion  abuts the margin of the sigmoid colon, as before. 3. Other imaging findings of potential clinical significance: Aortic Atherosclerosis (ICD10-I70.0). Mild mitral valve calcification. Lower lumbar impingement. Small umbilical hernia contains adipose tissue.   06/05/2022 Imaging   1. No findings of active malignancy. Stable chronic residua from previous effective treated metastatic disease in the liver and along the left adnexa. 2. Lumbar impingement at L4-5 and L5-S1. 3. Aortic atherosclerosis.   Aortic Atherosclerosis (ICD10-I70.0).   06/20/2022 Procedure   Successful right IJ vein Port-A-Cath explantation.    03/08/2023 Imaging   CT ABDOMEN PELVIS W CONTRAST  Result Date: 03/08/2023 CLINICAL DATA:  Follow-up cervical cancer EXAM: CT ABDOMEN AND PELVIS WITH CONTRAST TECHNIQUE: Multidetector CT imaging of the abdomen and pelvis was performed using the standard protocol following bolus administration of intravenous contrast. RADIATION DOSE REDUCTION: This exam was performed according to the departmental  dose-optimization program which includes automated exposure control, adjustment of the mA and/or kV according to patient size and/or use of iterative reconstruction technique. CONTRAST:  OMNIPAQUE IOHEXOL 300 MG/ML  SOLN COMPARISON:  06/04/2022 FINDINGS: Lower chest: Lung bases are clear. Hepatobiliary: 8 mm probable cyst in the right hepatic lobe (series 2/image 18), unchanged. Calcification along the right hepatic dome (series 2/image 9) and medial right hepatic lobe near the caudate (series 2/image 20), corresponding to prior treated metastatic disease. Gallbladder is unremarkable. No intrahepatic or extrahepatic ductal dilatation. Pancreas: Within normal limits. Spleen: Within normal limits. Adrenals/Urinary Tract: Adrenal glands are within normal limits. Kidneys are within normal limits. No hydronephrosis. Bladder is underdistended and poorly evaluated. Stomach/Bowel: Stomach is within normal limits. No evidence of bowel obstruction. Normal appendix (series 2/image 87). No colonic wall thickening or inflammatory changes. Vascular/Lymphatic: No evidence of abdominal aortic aneurysm. No suspicious abdominopelvic lymphadenopathy. Reproductive: Status post hysterectomy. Stable 1.8 x 1.7 cm left adnexal lesion with central calcification (series 2/image 7), corresponding to treated metastasis. Otherwise, no adnexal masses. Other: No abdominopelvic ascites. Small fat containing periumbilical hernia (series 2/image 81). Musculoskeletal: Mild degenerative changes of the visualized thoracolumbar spine. IMPRESSION: Status post hysterectomy. Stable treated hepatic and left adnexal metastases. No evidence of recurrent or metastatic disease. Electronically Signed   By: Charline Bills M.D.   On: 03/08/2023 15:23        PHYSICAL EXAMINATION: ECOG PERFORMANCE STATUS: 2 - Symptomatic, <50% confined to bed  Vitals:   03/13/23 1044  BP: (!) 147/75  Pulse: 73  Resp: 18  Temp: (!) 97.4 F (36.3 C)  SpO2: 100%    Filed Weights   03/13/23 1044  Weight: 210 lb 9.6 oz (95.5 kg)    GENERAL:alert, no distress and comfortable  LABORATORY DATA:  I have reviewed the data as listed    Component Value Date/Time   NA 139 03/06/2023 1041   NA 141 07/09/2017 1312   K 4.0 03/06/2023 1041   K 3.9 07/09/2017 1312   CL 104 03/06/2023 1041   CO2 31 03/06/2023 1041   CO2 27 07/09/2017 1312   GLUCOSE 84 03/06/2023 1041   GLUCOSE 109 07/09/2017 1312   BUN 31 (H) 03/06/2023 1041   BUN 16.4 07/09/2017 1312   CREATININE 0.84 03/06/2023 1041   CREATININE 0.8 07/09/2017 1312   CALCIUM 9.8 03/06/2023 1041   CALCIUM 9.2 07/09/2017 1312   PROT 7.1 03/06/2023 1041   PROT 7.1 07/09/2017 1312   ALBUMIN 4.0 03/06/2023 1041   ALBUMIN 3.1 (L) 07/09/2017 1312   AST 15 03/06/2023 1041   AST 35 (H) 07/09/2017 1312  ALT 17 03/06/2023 1041   ALT 43 07/09/2017 1312   ALKPHOS 98 03/06/2023 1041   ALKPHOS 182 (H) 07/09/2017 1312   BILITOT 0.5 03/06/2023 1041   BILITOT 0.34 07/09/2017 1312   GFRNONAA >60 03/06/2023 1041   GFRAA >60 03/29/2020 1105   GFRAA >60 01/04/2019 1351    No results found for: "SPEP", "UPEP"  Lab Results  Component Value Date   WBC 7.0 03/06/2023   NEUTROABS 5.0 03/06/2023   HGB 12.7 03/06/2023   HCT 39.0 03/06/2023   MCV 85.2 03/06/2023   PLT 186 03/06/2023      Chemistry      Component Value Date/Time   NA 139 03/06/2023 1041   NA 141 07/09/2017 1312   K 4.0 03/06/2023 1041   K 3.9 07/09/2017 1312   CL 104 03/06/2023 1041   CO2 31 03/06/2023 1041   CO2 27 07/09/2017 1312   BUN 31 (H) 03/06/2023 1041   BUN 16.4 07/09/2017 1312   CREATININE 0.84 03/06/2023 1041   CREATININE 0.8 07/09/2017 1312      Component Value Date/Time   CALCIUM 9.8 03/06/2023 1041   CALCIUM 9.2 07/09/2017 1312   ALKPHOS 98 03/06/2023 1041   ALKPHOS 182 (H) 07/09/2017 1312   AST 15 03/06/2023 1041   AST 35 (H) 07/09/2017 1312   ALT 17 03/06/2023 1041   ALT 43 07/09/2017 1312   BILITOT 0.5  03/06/2023 1041   BILITOT 0.34 07/09/2017 1312       RADIOGRAPHIC STUDIES: I have reviewed imaging studies with the patient and family I have personally reviewed the radiological images as listed and agreed with the findings in the report. CT ABDOMEN PELVIS W CONTRAST  Result Date: 03/08/2023 CLINICAL DATA:  Follow-up cervical cancer EXAM: CT ABDOMEN AND PELVIS WITH CONTRAST TECHNIQUE: Multidetector CT imaging of the abdomen and pelvis was performed using the standard protocol following bolus administration of intravenous contrast. RADIATION DOSE REDUCTION: This exam was performed according to the departmental dose-optimization program which includes automated exposure control, adjustment of the mA and/or kV according to patient size and/or use of iterative reconstruction technique. CONTRAST:  OMNIPAQUE IOHEXOL 300 MG/ML  SOLN COMPARISON:  06/04/2022 FINDINGS: Lower chest: Lung bases are clear. Hepatobiliary: 8 mm probable cyst in the right hepatic lobe (series 2/image 18), unchanged. Calcification along the right hepatic dome (series 2/image 9) and medial right hepatic lobe near the caudate (series 2/image 20), corresponding to prior treated metastatic disease. Gallbladder is unremarkable. No intrahepatic or extrahepatic ductal dilatation. Pancreas: Within normal limits. Spleen: Within normal limits. Adrenals/Urinary Tract: Adrenal glands are within normal limits. Kidneys are within normal limits. No hydronephrosis. Bladder is underdistended and poorly evaluated. Stomach/Bowel: Stomach is within normal limits. No evidence of bowel obstruction. Normal appendix (series 2/image 87). No colonic wall thickening or inflammatory changes. Vascular/Lymphatic: No evidence of abdominal aortic aneurysm. No suspicious abdominopelvic lymphadenopathy. Reproductive: Status post hysterectomy. Stable 1.8 x 1.7 cm left adnexal lesion with central calcification (series 2/image 7), corresponding to treated metastasis.  Otherwise, no adnexal masses. Other: No abdominopelvic ascites. Small fat containing periumbilical hernia (series 2/image 81). Musculoskeletal: Mild degenerative changes of the visualized thoracolumbar spine. IMPRESSION: Status post hysterectomy. Stable treated hepatic and left adnexal metastases. No evidence of recurrent or metastatic disease. Electronically Signed   By: Charline Bills M.D.   On: 03/08/2023 15:23

## 2023-03-13 NOTE — Assessment & Plan Note (Signed)
Recent TSH is stable Monitor closely

## 2023-03-13 NOTE — Assessment & Plan Note (Signed)
She has significant debility with back pain and difficulty with mobility I recommend physical therapy and rehab We discussed importance of dietary modification and weight loss strategies

## 2023-06-23 ENCOUNTER — Other Ambulatory Visit (HOSPITAL_BASED_OUTPATIENT_CLINIC_OR_DEPARTMENT_OTHER): Payer: Self-pay | Admitting: Nurse Practitioner

## 2023-06-23 ENCOUNTER — Encounter (HOSPITAL_COMMUNITY): Payer: Self-pay | Admitting: Nurse Practitioner

## 2023-06-23 ENCOUNTER — Other Ambulatory Visit (HOSPITAL_COMMUNITY): Payer: Self-pay | Admitting: Nurse Practitioner

## 2023-06-23 DIAGNOSIS — M7981 Nontraumatic hematoma of soft tissue: Secondary | ICD-10-CM

## 2023-06-25 ENCOUNTER — Ambulatory Visit (HOSPITAL_BASED_OUTPATIENT_CLINIC_OR_DEPARTMENT_OTHER)
Admission: RE | Admit: 2023-06-25 | Discharge: 2023-06-25 | Disposition: A | Discharge status: Home / Self Care | Payer: Medicare Other | Source: Ambulatory Visit | Attending: Nurse Practitioner | Admitting: Nurse Practitioner

## 2023-06-25 DIAGNOSIS — M7981 Nontraumatic hematoma of soft tissue: Secondary | ICD-10-CM | POA: Diagnosis present

## 2023-06-27 ENCOUNTER — Other Ambulatory Visit (HOSPITAL_BASED_OUTPATIENT_CLINIC_OR_DEPARTMENT_OTHER): Payer: Self-pay | Admitting: Family Medicine

## 2023-06-27 DIAGNOSIS — S8012XD Contusion of left lower leg, subsequent encounter: Secondary | ICD-10-CM

## 2023-06-28 ENCOUNTER — Ambulatory Visit (HOSPITAL_BASED_OUTPATIENT_CLINIC_OR_DEPARTMENT_OTHER)
Admission: RE | Admit: 2023-06-28 | Discharge: 2023-06-28 | Disposition: A | Discharge status: Home / Self Care | Payer: Medicare Other | Source: Ambulatory Visit | Attending: Family Medicine | Admitting: Family Medicine

## 2023-06-28 DIAGNOSIS — S8012XD Contusion of left lower leg, subsequent encounter: Secondary | ICD-10-CM | POA: Insufficient documentation

## 2023-09-16 ENCOUNTER — Telehealth: Payer: Self-pay

## 2023-09-16 NOTE — Telephone Encounter (Signed)
 Called and given radiology scheduling # to schedule CT and scheduled appt with Dr. Bertis Ruddy on 5/16 at 1220.

## 2023-11-14 ENCOUNTER — Inpatient Hospital Stay: Attending: Hematology and Oncology

## 2023-11-14 ENCOUNTER — Ambulatory Visit (HOSPITAL_COMMUNITY)
Admission: RE | Admit: 2023-11-14 | Discharge: 2023-11-14 | Disposition: A | Discharge status: Home / Self Care | Source: Ambulatory Visit | Attending: Hematology and Oncology | Admitting: Hematology and Oncology

## 2023-11-14 DIAGNOSIS — C541 Malignant neoplasm of endometrium: Secondary | ICD-10-CM

## 2023-11-14 DIAGNOSIS — Z9221 Personal history of antineoplastic chemotherapy: Secondary | ICD-10-CM | POA: Insufficient documentation

## 2023-11-14 DIAGNOSIS — Z923 Personal history of irradiation: Secondary | ICD-10-CM | POA: Diagnosis not present

## 2023-11-14 DIAGNOSIS — Z8542 Personal history of malignant neoplasm of other parts of uterus: Secondary | ICD-10-CM | POA: Diagnosis present

## 2023-11-14 LAB — CBC WITH DIFFERENTIAL/PLATELET
Abs Immature Granulocytes: 0.03 10*3/uL (ref 0.00–0.07)
Basophils Absolute: 0.1 10*3/uL (ref 0.0–0.1)
Basophils Relative: 1 %
Eosinophils Absolute: 0.1 10*3/uL (ref 0.0–0.5)
Eosinophils Relative: 2 %
HCT: 35.4 % — ABNORMAL LOW (ref 36.0–46.0)
Hemoglobin: 12.2 g/dL (ref 12.0–15.0)
Immature Granulocytes: 0 %
Lymphocytes Relative: 25 %
Lymphs Abs: 1.7 10*3/uL (ref 0.7–4.0)
MCH: 27.8 pg (ref 26.0–34.0)
MCHC: 34.5 g/dL (ref 30.0–36.0)
MCV: 80.6 fL (ref 80.0–100.0)
Monocytes Absolute: 0.6 10*3/uL (ref 0.1–1.0)
Monocytes Relative: 9 %
Neutro Abs: 4.3 10*3/uL (ref 1.7–7.7)
Neutrophils Relative %: 63 %
Platelets: 201 10*3/uL (ref 150–400)
RBC: 4.39 MIL/uL (ref 3.87–5.11)
RDW: 13 % (ref 11.5–15.5)
WBC: 6.9 10*3/uL (ref 4.0–10.5)
nRBC: 0 % (ref 0.0–0.2)

## 2023-11-14 LAB — COMPREHENSIVE METABOLIC PANEL WITH GFR
ALT: 11 U/L (ref 0–44)
AST: 19 U/L (ref 15–41)
Albumin: 4.1 g/dL (ref 3.5–5.0)
Alkaline Phosphatase: 98 U/L (ref 38–126)
Anion gap: 6 (ref 5–15)
BUN: 21 mg/dL (ref 8–23)
CO2: 28 mmol/L (ref 22–32)
Calcium: 9.5 mg/dL (ref 8.9–10.3)
Chloride: 103 mmol/L (ref 98–111)
Creatinine, Ser: 0.71 mg/dL (ref 0.44–1.00)
GFR, Estimated: >60 mL/min (ref >60)
Glucose, Bld: 89 mg/dL (ref 70–99)
Potassium: 4 mmol/L (ref 3.5–5.1)
Sodium: 137 mmol/L (ref 135–145)
Total Bilirubin: 0.7 mg/dL (ref 0.0–1.2)
Total Protein: 7.4 g/dL (ref 6.5–8.1)

## 2023-11-14 MED ORDER — SODIUM CHLORIDE (PF) 0.9 % IJ SOLN
INTRAMUSCULAR | Status: AC
Start: 2023-11-14 — End: ?
  Filled 2023-11-14: qty 50

## 2023-11-14 MED ORDER — IOHEXOL 300 MG/ML  SOLN
100.0000 mL | Freq: Once | INTRAMUSCULAR | Status: AC | PRN
  Administered 2023-11-14: 100 mL via INTRAVENOUS

## 2023-11-14 NOTE — Assessment & Plan Note (Signed)
 The patient was originally diagnosed with endometrial cancer in 2017, stage IV disease with peritoneal implant and vaginal involvement, status post surgery and concurrent chemoradiation therapy with cisplatin , followed by carboplatin  and paclitaxel  completed by May 2018 She has subsequent relapse in November 2018 and was treated with letrozole  and everolimus . 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  She has a chief complete response/stability of all disease by March 2021 I have reviewed blood work and imaging studies with the patient which show no evidence of cancer recurrence I plan to see her once a year with repeat imaging study in May 2026

## 2023-11-21 ENCOUNTER — Inpatient Hospital Stay (HOSPITAL_BASED_OUTPATIENT_CLINIC_OR_DEPARTMENT_OTHER): Admitting: Hematology and Oncology

## 2023-11-21 ENCOUNTER — Encounter: Payer: Self-pay | Admitting: Hematology and Oncology

## 2023-11-21 VITALS — BP 153/78 | HR 65 | Temp 97.8°F | Resp 16

## 2023-11-21 DIAGNOSIS — I10 Essential (primary) hypertension: Secondary | ICD-10-CM | POA: Diagnosis not present

## 2023-11-21 DIAGNOSIS — M79671 Pain in right foot: Secondary | ICD-10-CM

## 2023-11-21 DIAGNOSIS — C541 Malignant neoplasm of endometrium: Secondary | ICD-10-CM

## 2023-11-21 DIAGNOSIS — Z8542 Personal history of malignant neoplasm of other parts of uterus: Secondary | ICD-10-CM | POA: Diagnosis not present

## 2023-11-21 NOTE — Assessment & Plan Note (Addendum)
Her blood pressure is elevated today likely due to stress from the appointment Observe closely

## 2023-11-21 NOTE — Progress Notes (Signed)
 [facility] OFFICE PROGRESS NOTE  Patient Care Team: Tura Gaines, MD as PCP - General (Family Medicine)  Assessment & Plan Recurrent carcinoma of endometrium [facility]) The patient was originally diagnosed with endometrial cancer in 2017, stage IV disease with peritoneal implant and vaginal involvement, status post surgery and concurrent chemoradiation therapy with cisplatin , followed by carboplatin  and paclitaxel  completed by May 2018 She has subsequent relapse in November 2018 and was treated with letrozole  and everolimus . 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  She has a chief complete response/stability of all disease by March 2021 I have reviewed blood work and imaging studies with the patient which show no evidence of cancer recurrence I plan to see her once a year with repeat imaging study in May 2026 Right foot pain She had recent surgery and was not able to exercise She has gained a lot of weight since the last visit She is healing well and has made a commitment to exercise more and to lose some weight before her next visit Essential hypertension Her blood pressure is elevated today likely due to stress from the appointment Observe closely  Orders Placed This Encounter  Procedures   CT ABDOMEN PELVIS W CONTRAST    Standing Status:   Future    Expected Date:   11/05/2024    Expiration Date:   11/20/2024    Scheduling Instructions:     No oral contrast    If indicated for the ordered procedure, I authorize the administration of contrast media per Radiology protocol:   Yes    Does the patient have a contrast media/X-ray dye allergy?:   No    Preferred imaging location?:   [facility]    If indicated for the ordered procedure, I authorize the  administration of oral contrast media per Radiology protocol:   No    Reason for no oral contrast::   No need oral contrast     Almeda Jacobs, MD  INTERVAL HISTORY: she returns for surveillance follow-up for history of metastatic uterine cancer She is here accompanied by family She had recent surgery to her toes and not able to stand up She has not been able to exercise and gained some weight Her blood pressure is elevated today She denies abdominal pain or changes in bowel habits  PHYSICAL EXAMINATION: ECOG PERFORMANCE STATUS: 2 - Symptomatic, <50% confined to bed  Vitals:   11/21/23 1234 11/21/23 1235  BP: (!) 163/96 (!) 153/78  Pulse: 65   Resp: 16   Temp: 97.8 F (36.6 C)   SpO2: 95%    There were no vitals filed for this visit.  Relevant data reviewed during this visit included CBC, CMP and CT imaging of the abdomen and pelvis from May 2025

## 2023-11-21 NOTE — Assessment & Plan Note (Addendum)
 She had recent surgery and was not able to exercise She has gained a lot of weight since the last visit She is healing well and has made a commitment to exercise more and to lose some weight before her next visit

## 2024-03-31 ENCOUNTER — Other Ambulatory Visit: Payer: Self-pay | Admitting: Family Medicine

## 2024-03-31 DIAGNOSIS — G8929 Other chronic pain: Secondary | ICD-10-CM

## 2024-04-07 ENCOUNTER — Other Ambulatory Visit

## 2024-04-08 ENCOUNTER — Ambulatory Visit
Admission: RE | Admit: 2024-04-08 | Discharge: 2024-04-08 | Disposition: A | Discharge status: Home / Self Care | Source: Ambulatory Visit | Attending: Family Medicine | Admitting: Family Medicine

## 2024-04-08 DIAGNOSIS — G8929 Other chronic pain: Secondary | ICD-10-CM

## 2024-04-16 ENCOUNTER — Ambulatory Visit: Admitting: Cardiology

## 2024-11-12 ENCOUNTER — Other Ambulatory Visit

## 2024-11-23 ENCOUNTER — Ambulatory Visit: Admitting: Hematology and Oncology
